# Patient Record
Sex: Female | Born: 1950
Health system: Southern US, Community
[De-identification: ages and names within clinical notes are randomized; demographics above are authoritative.]

## PROBLEM LIST (undated history)

## (undated) DIAGNOSIS — J45909 Unspecified asthma, uncomplicated: Secondary | ICD-10-CM

## (undated) DIAGNOSIS — R232 Flushing: Secondary | ICD-10-CM

## (undated) DIAGNOSIS — R011 Cardiac murmur, unspecified: Secondary | ICD-10-CM

## (undated) DIAGNOSIS — R42 Dizziness and giddiness: Secondary | ICD-10-CM

## (undated) DIAGNOSIS — I700 Atherosclerosis of aorta: Secondary | ICD-10-CM

## (undated) DIAGNOSIS — F32A Depression, unspecified: Secondary | ICD-10-CM

## (undated) DIAGNOSIS — R51 Headache: Secondary | ICD-10-CM

## (undated) DIAGNOSIS — R931 Abnormal findings on diagnostic imaging of heart and coronary circulation: Secondary | ICD-10-CM

## (undated) DIAGNOSIS — E785 Hyperlipidemia, unspecified: Secondary | ICD-10-CM

## (undated) DIAGNOSIS — D510 Vitamin B12 deficiency anemia due to intrinsic factor deficiency: Secondary | ICD-10-CM

## (undated) DIAGNOSIS — I341 Nonrheumatic mitral (valve) prolapse: Secondary | ICD-10-CM

## (undated) DIAGNOSIS — I499 Cardiac arrhythmia, unspecified: Secondary | ICD-10-CM

## (undated) DIAGNOSIS — E559 Vitamin D deficiency, unspecified: Secondary | ICD-10-CM

## (undated) DIAGNOSIS — R7303 Prediabetes: Secondary | ICD-10-CM

## (undated) DIAGNOSIS — K5790 Diverticulosis of intestine, part unspecified, without perforation or abscess without bleeding: Secondary | ICD-10-CM

## (undated) DIAGNOSIS — E039 Hypothyroidism, unspecified: Secondary | ICD-10-CM

## (undated) DIAGNOSIS — J449 Chronic obstructive pulmonary disease, unspecified: Secondary | ICD-10-CM

## (undated) DIAGNOSIS — G4733 Obstructive sleep apnea (adult) (pediatric): Secondary | ICD-10-CM

## (undated) DIAGNOSIS — L719 Rosacea, unspecified: Secondary | ICD-10-CM

## (undated) DIAGNOSIS — Z9989 Dependence on other enabling machines and devices: Secondary | ICD-10-CM

## (undated) DIAGNOSIS — Z9886 Personal history of breast implant removal: Secondary | ICD-10-CM

## (undated) DIAGNOSIS — H269 Unspecified cataract: Secondary | ICD-10-CM

## (undated) DIAGNOSIS — I10 Essential (primary) hypertension: Secondary | ICD-10-CM

## (undated) DIAGNOSIS — J309 Allergic rhinitis, unspecified: Secondary | ICD-10-CM

## (undated) DIAGNOSIS — E8940 Asymptomatic postprocedural ovarian failure: Secondary | ICD-10-CM

## (undated) DIAGNOSIS — M1990 Unspecified osteoarthritis, unspecified site: Secondary | ICD-10-CM

## (undated) DIAGNOSIS — G4730 Sleep apnea, unspecified: Secondary | ICD-10-CM

## (undated) DIAGNOSIS — H04123 Dry eye syndrome of bilateral lacrimal glands: Secondary | ICD-10-CM

## (undated) DIAGNOSIS — F329 Major depressive disorder, single episode, unspecified: Secondary | ICD-10-CM

## (undated) DIAGNOSIS — K219 Gastro-esophageal reflux disease without esophagitis: Secondary | ICD-10-CM

## (undated) DIAGNOSIS — Z8719 Personal history of other diseases of the digestive system: Secondary | ICD-10-CM

## (undated) DIAGNOSIS — T7840XA Allergy, unspecified, initial encounter: Secondary | ICD-10-CM

## (undated) HISTORY — DX: Hypothyroidism, unspecified: E03.9

## (undated) HISTORY — DX: Prediabetes: R73.03

## (undated) HISTORY — DX: Major depressive disorder, single episode, unspecified: F32.9

## (undated) HISTORY — DX: Sleep apnea, unspecified: G47.30

## (undated) HISTORY — DX: Obstructive sleep apnea (adult) (pediatric): G47.33

## (undated) HISTORY — PX: OTHER SURGICAL HISTORY: SHX169

## (undated) HISTORY — DX: Unspecified osteoarthritis, unspecified site: M19.90

## (undated) HISTORY — DX: Unspecified asthma, uncomplicated: J45.909

## (undated) HISTORY — DX: Vitamin B12 deficiency anemia due to intrinsic factor deficiency: D51.0

## (undated) HISTORY — DX: Essential (primary) hypertension: I10

## (undated) HISTORY — PX: HERNIA REPAIR: SHX51

## (undated) HISTORY — DX: Diverticulosis of intestine, part unspecified, without perforation or abscess without bleeding: K57.90

## (undated) HISTORY — DX: Gastro-esophageal reflux disease without esophagitis: K21.9

## (undated) HISTORY — DX: Atherosclerosis of aorta: I70.0

## (undated) HISTORY — DX: Nonrheumatic mitral (valve) prolapse: I34.1

## (undated) HISTORY — DX: Dry eye syndrome of bilateral lacrimal glands: H04.123

## (undated) HISTORY — DX: Chronic obstructive pulmonary disease, unspecified: J44.9

## (undated) HISTORY — DX: Depression, unspecified: F32.A

## (undated) HISTORY — DX: Flushing: R23.2

## (undated) HISTORY — PX: NASAL SINUS SURGERY: SHX719

## (undated) HISTORY — DX: Rosacea, unspecified: L71.9

## (undated) HISTORY — DX: Allergy, unspecified, initial encounter: T78.40XA

## (undated) HISTORY — DX: Dependence on other enabling machines and devices: Z99.89

## (undated) HISTORY — DX: Unspecified cataract: H26.9

## (undated) HISTORY — DX: Personal history of other diseases of the digestive system: Z87.19

## (undated) HISTORY — DX: Abnormal findings on diagnostic imaging of heart and coronary circulation: R93.1

## (undated) HISTORY — DX: Allergic rhinitis, unspecified: J30.9

## (undated) HISTORY — DX: Hyperlipidemia, unspecified: E78.5

## (undated) HISTORY — DX: Dizziness and giddiness: R42

## (undated) HISTORY — PX: TUBAL LIGATION: SHX77

## (undated) HISTORY — DX: Personal history of breast implant removal: Z98.86

## (undated) HISTORY — DX: Vitamin D deficiency, unspecified: E55.9

## (undated) HISTORY — DX: Asymptomatic postprocedural ovarian failure: E89.40

## (undated) HISTORY — DX: Cardiac murmur, unspecified: R01.1

## (undated) HISTORY — DX: Headache: R51

## (undated) HISTORY — DX: Cardiac arrhythmia, unspecified: I49.9

---

## 1989-10-27 DIAGNOSIS — E8940 Asymptomatic postprocedural ovarian failure: Secondary | ICD-10-CM

## 1989-10-27 HISTORY — PX: TOTAL ABDOMINAL HYSTERECTOMY: SHX209

## 1989-10-27 HISTORY — DX: Asymptomatic postprocedural ovarian failure: E89.40

## 1999-09-07 ENCOUNTER — Encounter: Payer: Self-pay | Admitting: Pulmonary Disease

## 2001-04-18 ENCOUNTER — Encounter: Payer: Self-pay | Admitting: Pulmonary Disease

## 2001-04-23 ENCOUNTER — Encounter: Payer: Self-pay | Admitting: Pulmonary Disease

## 2001-12-21 ENCOUNTER — Encounter: Payer: Self-pay | Admitting: *Deleted

## 2001-12-21 ENCOUNTER — Encounter: Admission: RE | Admit: 2001-12-21 | Discharge: 2001-12-21 | Payer: Self-pay | Admitting: *Deleted

## 2004-10-27 HISTORY — PX: COLONOSCOPY: SHX174

## 2004-10-27 HISTORY — PX: BREAST ENHANCEMENT SURGERY: SHX7

## 2005-10-27 HISTORY — PX: CHOLECYSTECTOMY: SHX55

## 2005-10-27 HISTORY — PX: APPENDECTOMY: SHX54

## 2007-03-24 ENCOUNTER — Encounter: Admission: RE | Admit: 2007-03-24 | Discharge: 2007-03-24 | Payer: Self-pay | Admitting: Internal Medicine

## 2007-08-20 ENCOUNTER — Encounter: Payer: Self-pay | Admitting: Pulmonary Disease

## 2007-08-23 ENCOUNTER — Encounter: Payer: Self-pay | Admitting: Pulmonary Disease

## 2007-08-28 HISTORY — PX: OTHER SURGICAL HISTORY: SHX169

## 2007-09-06 ENCOUNTER — Encounter: Payer: Self-pay | Admitting: Pulmonary Disease

## 2007-09-17 ENCOUNTER — Encounter: Payer: Self-pay | Admitting: Pulmonary Disease

## 2007-11-28 HISTORY — PX: US ECHOCARDIOGRAPHY: HXRAD669

## 2007-12-02 ENCOUNTER — Encounter: Payer: Self-pay | Admitting: Pulmonary Disease

## 2008-05-26 ENCOUNTER — Encounter: Admission: RE | Admit: 2008-05-26 | Discharge: 2008-05-26 | Payer: Self-pay | Admitting: Internal Medicine

## 2008-06-09 ENCOUNTER — Ambulatory Visit: Payer: Self-pay | Admitting: Pulmonary Disease

## 2008-06-09 DIAGNOSIS — G2581 Restless legs syndrome: Secondary | ICD-10-CM | POA: Insufficient documentation

## 2008-06-09 DIAGNOSIS — J302 Other seasonal allergic rhinitis: Secondary | ICD-10-CM

## 2008-06-09 DIAGNOSIS — Z9989 Dependence on other enabling machines and devices: Secondary | ICD-10-CM | POA: Insufficient documentation

## 2008-06-09 DIAGNOSIS — J3089 Other allergic rhinitis: Secondary | ICD-10-CM | POA: Insufficient documentation

## 2008-06-09 DIAGNOSIS — E785 Hyperlipidemia, unspecified: Secondary | ICD-10-CM

## 2008-06-09 DIAGNOSIS — R519 Headache, unspecified: Secondary | ICD-10-CM | POA: Insufficient documentation

## 2008-06-09 DIAGNOSIS — J309 Allergic rhinitis, unspecified: Secondary | ICD-10-CM

## 2008-06-09 DIAGNOSIS — G8929 Other chronic pain: Secondary | ICD-10-CM

## 2008-06-09 DIAGNOSIS — G4733 Obstructive sleep apnea (adult) (pediatric): Secondary | ICD-10-CM | POA: Insufficient documentation

## 2008-06-09 DIAGNOSIS — E782 Mixed hyperlipidemia: Secondary | ICD-10-CM | POA: Insufficient documentation

## 2008-06-09 DIAGNOSIS — I499 Cardiac arrhythmia, unspecified: Secondary | ICD-10-CM | POA: Insufficient documentation

## 2008-06-09 DIAGNOSIS — I480 Paroxysmal atrial fibrillation: Secondary | ICD-10-CM

## 2008-06-09 DIAGNOSIS — I10 Essential (primary) hypertension: Secondary | ICD-10-CM | POA: Insufficient documentation

## 2008-06-09 DIAGNOSIS — R51 Headache: Secondary | ICD-10-CM

## 2008-06-09 DIAGNOSIS — Z8679 Personal history of other diseases of the circulatory system: Secondary | ICD-10-CM | POA: Insufficient documentation

## 2008-06-09 HISTORY — DX: Essential (primary) hypertension: I10

## 2008-06-09 HISTORY — DX: Hyperlipidemia, unspecified: E78.5

## 2008-06-09 HISTORY — DX: Cardiac arrhythmia, unspecified: I49.9

## 2008-06-09 HISTORY — DX: Headache, unspecified: R51.9

## 2008-06-09 HISTORY — DX: Allergic rhinitis, unspecified: J30.9

## 2008-06-09 HISTORY — DX: Paroxysmal atrial fibrillation: I48.0

## 2008-06-09 HISTORY — DX: Other chronic pain: G89.29

## 2008-06-29 ENCOUNTER — Ambulatory Visit: Payer: Self-pay | Admitting: Pulmonary Disease

## 2008-06-29 DIAGNOSIS — J984 Other disorders of lung: Secondary | ICD-10-CM | POA: Insufficient documentation

## 2008-07-16 ENCOUNTER — Encounter: Payer: Self-pay | Admitting: Pulmonary Disease

## 2008-08-09 ENCOUNTER — Encounter: Payer: Self-pay | Admitting: Pulmonary Disease

## 2008-08-22 ENCOUNTER — Encounter: Payer: Self-pay | Admitting: Pulmonary Disease

## 2008-11-02 HISTORY — PX: ESOPHAGOGASTRODUODENOSCOPY: SHX1529

## 2008-11-23 LAB — HM DEXA SCAN: HM Dexa Scan: NORMAL

## 2008-12-25 HISTORY — PX: RECTOCELE REPAIR: SHX761

## 2009-03-02 ENCOUNTER — Ambulatory Visit: Payer: Self-pay | Admitting: Adult Health

## 2009-03-02 DIAGNOSIS — J209 Acute bronchitis, unspecified: Secondary | ICD-10-CM | POA: Insufficient documentation

## 2009-03-02 DIAGNOSIS — R05 Cough: Secondary | ICD-10-CM | POA: Insufficient documentation

## 2009-03-02 DIAGNOSIS — R059 Cough, unspecified: Secondary | ICD-10-CM | POA: Insufficient documentation

## 2009-04-12 ENCOUNTER — Ambulatory Visit: Payer: Self-pay | Admitting: Internal Medicine

## 2009-07-05 ENCOUNTER — Ambulatory Visit: Payer: Self-pay | Admitting: Pulmonary Disease

## 2009-09-26 HISTORY — PX: US ECHOCARDIOGRAPHY: HXRAD669

## 2009-09-27 HISTORY — PX: BREAST BIOPSY: SHX20

## 2010-01-11 ENCOUNTER — Encounter: Payer: Self-pay | Admitting: Pulmonary Disease

## 2010-01-15 ENCOUNTER — Ambulatory Visit: Payer: Self-pay | Admitting: Pulmonary Disease

## 2010-01-28 ENCOUNTER — Telehealth: Payer: Self-pay | Admitting: Pulmonary Disease

## 2010-11-11 LAB — HM COLONOSCOPY

## 2010-11-16 ENCOUNTER — Encounter: Payer: Self-pay | Admitting: Internal Medicine

## 2010-11-26 NOTE — Assessment & Plan Note (Signed)
Summary: sob//mbw   Copy to:  [facility]/Referring Deidra Spease:  Prochnau  CC:  increased SOB, dry cough, and wheezing since rectocele repair on 01-15-09 - saw PCP and was given breathing tx and zpak with no relief.  History of Present Illness: 60 -year-old female  seen for pulmonary consult for evaluation of pulmonary nodules.   9/09--She was last noted to have a clustering of nodular densities in the right upper lobe, as well as a left lower lobe nodule and also one in the lingula from a CAT scan in October of 2008. She underwent bronchoscopy by Dr. Blenda Nicely, with no specific diagnosis being obtained. The patient has subsequently had a followup CT in February of this year, and there were no pulmonary nodules or mediastinal lymphadenopathy noted. There was a 17 mm right axillary lymph node which was 13 mm on a CAT scan from 2006. The patient thinks that she may have had exposure to tuberculosis in the past, but is unsure. She also lived in [place] for 2 years, which is an area endemic for histoplasmosis.  --the patient has a history of pulmonary nodules last year which was investigated with bronchoscopy and yielded a negative result. She has had a followup CT in February of this year, with no pulmonary nodules being noted. I suspect these were inflammatory in nature. No further workup is required. She was found to have an enlarging right axillary node, and I would make sure that her health maintenance is up to date. I wonder if this is related to her ruptured breast implant?  Mar 02, 2009--Presents for acute office viist. c/o increased SOB, dry cough, wheezing since rectocele repair on 01-15-09 - saw PCP and was given breathing tx and zpak. felt better for about 2 weeks. Now symptoms returning. Had xray pre-op which she reports as normal. . Symptoms started  ~6 weeks ago.    Medications Prior to Update: 1)  Allegra-D 12 Hour 60-120 Mg  Xr12h-Tab (Fexofenadine-Pseudoephedrine) ....  Take 1 Tab By Mouth Once Daily As Needed 2)  Aspirin Low Dose 81 Mg  Tabs (Aspirin) .... Take 1 Tablet Once Daily 3)  Klor-Con M10 10 Meq  Cr-Tabs (Potassium Chloride Crys Cr) .... Take 1 Tablet By Mouth Once A Day 4)  Metoprolol-Hydrochlorothiazide 100-25 Mg  Tabs (Metoprolol-Hydrochlorothiazide) .... Take 1/2 Tab By Mouth Once Daily 5)  Metoprolol Succinate 50 Mg  Xr24h-Tab (Metoprolol Succinate) .... Take 1 Tablet By Mouth Once A Day 6)  Paxil 30 Mg  Tabs (Paroxetine Hcl) .... Take 1 Tab By Mouth At Bedtime 7)  Levothroid 50 Mcg  Tabs (Levothyroxine Sodium) .... Take 1 Tablet By Mouth Once A Day 8)  Tricor 145 Mg  Tabs (Fenofibrate) .... Take 1 Tab By Mouth At Bedtime 9)  Simvastatin 40 Mg  Tabs (Simvastatin) .... Take 1 Tab By Mouth At Bedtime 10)  Estrace 2 Mg  Tabs (Estradiol) .... Take 1 Tablet By Mouth Once A Day 11)  Singulair 10 Mg  Tabs (Montelukast Sodium) .... Take 1 Tab By Mouth At Bedtime 12)  Ipratropium Bromide 0.03 %  Soln (Ipratropium Bromide) .... Inhale 2 Spray in Each Nostril 2 To 3 Times Per Day For Runny Nose. 13)  Patanase 0.6 %  Soln (Olopatadine Hcl) .... Inhale 2 Puffs in Each Nostril Two Times A Day 14)  Proventil Hfa 108 (90 Base) Mcg/act  Aers (Albuterol Sulfate) .[name].. 1-2 Puffs Every 4-6 Hours As Needed 15)  Ranitidine Hcl 300 Mg  Caps (Ranitidine Hcl) .Marland KitchenMarland KitchenMarland Kitchen  Take 1 Tab By Mouth At Bedtime 16)  Pantoprazole Sodium 40 Mg  Tbec (Pantoprazole Sodium) .... Take 1 Tab By Mouth Each Morning 17)  Lamisil 250 Mg  Tabs (Terbinafine Hcl) .... Take 1 Tablet By Mouth Once A Day 18)  Meclizine Hcl 25 Mg  Tabs (Meclizine Hcl) .... Take 1 Tab By Mouth As Needed For Dizziness (Take With Diazepam) 19)  Diazepam 5 Mg  Tabs (Diazepam) .... Take 1 Tab By Mouth As Needed For Dizziness. (Take With Meclizine) 20)  Mirapex 0.25 Mg  Tabs (Pramipexole Dihydrochloride) .... By Mouth At Bedtime 21)  Vitamin D 16109 Unit Caps (Ergocalciferol) .... Once Weekly  Current Medications  (verified): 1)  Allegra-D 12 Hour 60-120 Mg  Xr12h-Tab (Fexofenadine-Pseudoephedrine) .... Take 1 Tab By Mouth Once Daily As Needed 2)  Aspirin Low Dose 81 Mg  Tabs (Aspirin) .... Take 1 Tablet Once Daily 3)  Metoprolol Tartrate 50 Mg Tabs (Metoprolol Tartrate) .... Take 1 Tablet By Mouth Two Times A Day 4)  Levothroid 50 Mcg  Tabs (Levothyroxine Sodium) .... Take 1 Tablet By Mouth Once A Day 5)  Singulair 10 Mg  Tabs (Montelukast Sodium) .... Take 1 Tab By Mouth At Bedtime 6)  Ipratropium Bromide 0.03 %  Soln (Ipratropium Bromide) .... Inhale 2 Spray in Each Nostril 2 To 3 Times Per Day For Runny Nose. 7)  Patanase 0.6 %  Soln (Olopatadine Hcl) .... Inhale 2 Puffs in Each Nostril Two Times A Day 8)  Proventil Hfa 108 (90 Base) Mcg/act  Aers (Albuterol Sulfate) .[name].. 1-2 Puffs Every 4-6 Hours As Needed 9)  Meclizine Hcl 25 Mg  Tabs (Meclizine Hcl) .... Take 1 Tab By Mouth As Needed For Dizziness (Take With Diazepam) 10)  Diazepam 5 Mg  Tabs (Diazepam) .... Take 1 Tab By Mouth As Needed For Dizziness. (Take With Meclizine) 11)  Vitamin D 60454 Unit Caps (Ergocalciferol) .... Once Weekly 12)  Vivelle 0.1 Mg/24hr Pttw (Estradiol) .... Apply Once Every 3 Days 13)  Kapidex 60 Mg Cpdr (Dexlansoprazole) .... Take 1 Capsule By Mouth Once A Day 14)  Premarin 0.625 Mg/gm Crea (Estrogens, Conjugated) .... Apply Every Night 15)  Cyanocobalamin 1000 Mcg/ml Soln (Cyanocobalamin) .[name].. 1 Shot Every 4-6 Weeks 16)  Lovaza 1 Gm Caps (Omega-3-Acid Ethyl Esters) .... Take 1 Capsule By Mouth Once A Day  Allergies (verified): 1)  ! Codeine 2)  ! Sulfa 3)  ! 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    History of children who are grown (06/29/2008)  Risk Factors:    Alcohol Use: N/A    >5 drinks/d w/in last 3 months: N/A     Caffeine Use: N/A    Diet: N/A    Exercise: N/A  Risk Factors:    Smoking Status: quit (06/29/2008)    Packs/Day: N/A    Cigars/wk: N/A    Pipe Use/wk: N/A    Cans of tobacco/wk: N/A    Passive Smoke Exposure: N/A  Vital Signs:  Patient profile:   60 year old female Height:      65.5 inches Weight:      195 pounds BMI:     32.07 O2 Sat:      96 % Temp:     96.8 degrees F oral Pulse rate:  68 / minute BP sitting:   126 / 76  (left arm) Cuff size:   regular  Vitals Entered By: Boone Master CNA (Mar 02, 2009 3:46 PM)  O2 Sat at Rest %:  96 O2 Flow:  room air CC: increased SOB, dry cough, wheezing since rectocele repair on 01-15-09 - saw PCP and was given breathing tx and zpak with no relief Is Patient Diabetic? No Comments Medications reviewed with patient Boone Master CNA  Mar 02, 2009 3:48 PM     Impression & Recommendations:  Problem # 1:  BRONCHITIS, ACUTE (ICD-466.0)  Slow to resolve w/ upper airway cough syndrome ?GERD vs Rhinitis REC:  Prednisone taper over next week.  Mucinex DM two times a day for cough Tessalon three times a day for cough Tussionex 1/2-1 tsp two times a day as needed cough, may make you sleepy Zyrtec 10mg  at bedtime  Saline nasal rinses as needed  Add Pepcid 10mg  at bedtime  AVOID COUGHING, THROAT CLEARING. Use sugarless candy, NO MINTS, ice chips.  Please contact office for sooner follow up if symptoms do not improve or worsen  follow up Dr. Shelle Iron 3 weeks.     Allegra-d 12 Hour 60-120 Mg Xr12h-tab (Fexofenadine-pseudoephedrine) .[name]... Take 1 tab by mouth once daily as needed    Singulair 10 Mg Tabs (Montelukast sodium) .[name]... Take 1 tab by mouth at bedtime    Proventil Hfa 108 (90 Base) Mcg/act Aers (Albuterol sulfate) .[name]... 1-2 puffs every 4-6 hours as needed    Tessalon 200 Mg Caps (Benzonatate) .[name]... 1 by mouth three times a day as needed cough    Tussionex Pennkinetic Er 8-10 Mg/47ml Lqcr (Chlorpheniramine-hydrocodone) .[name]... 1/2-1  tsp every 12 hr as needed  Orders: Est. Patient Level IV (16109)  Medications Added to Medication List This Visit: 1)  Metoprolol Tartrate 50 Mg Tabs (Metoprolol tartrate) .... Take 1 tablet by mouth two times a day 2)  Vivelle 0.1 Mg/24hr Pttw (Estradiol) .... Apply once every 3 days 3)  Kapidex 60 Mg Cpdr (Dexlansoprazole) .... Take 1 capsule by mouth once a day 4)  Premarin 0.625 Mg/gm Crea (Estrogens, conjugated) .... Apply every night 5)  Cyanocobalamin 1000 Mcg/ml Soln (Cyanocobalamin) .[name].. 1 shot every 4-6 weeks 6)  Lovaza 1 Gm Caps (Omega-3-acid ethyl esters) .... Take 1 capsule by mouth once a day 7)  Prednisone 10 Mg Tabs (Prednisone) .... 4 tabs for 2 days, then 3 tabs for 2 days, 2 tabs for 2 days, then 1 tab for 2 days, then stop 8)  Tessalon 200 Mg Caps (Benzonatate) .[name].. 1 by mouth three times a day as needed cough 9)  Tussionex Pennkinetic Er 8-10 Mg/35ml Lqcr (Chlorpheniramine-hydrocodone) .... 1/2-1 tsp every 12 hr as needed  Complete Medication List: 1)  Allegra-d 12 Hour 60-120 Mg Xr12h-tab (Fexofenadine-pseudoephedrine) .... Take 1 tab by mouth once daily as needed 2)  Aspirin Low Dose 81 Mg Tabs (Aspirin) .... Take 1 tablet once daily 3)  Metoprolol Tartrate 50 Mg Tabs (Metoprolol tartrate) .... Take 1 tablet by mouth two times a day 4)  Levothroid 50 Mcg Tabs (Levothyroxine sodium) .... Take 1 tablet by mouth once a day 5)  Singulair 10 Mg Tabs (Montelukast sodium) .... Take 1 tab by mouth at bedtime 6)  Ipratropium Bromide 0.03 % Soln (Ipratropium bromide) .... Inhale 2 spray in each nostril 2 to 3 times per day for runny nose. 7)  Patanase 0.6 % Soln (Olopatadine hcl) .... Inhale 2 puffs in each nostril  two times a day 8)  Proventil Hfa 108 (90 Base) Mcg/act Aers (Albuterol sulfate) .[name].. 1-2 puffs every 4-6 hours as needed 9)  Meclizine Hcl 25 Mg Tabs (Meclizine hcl) .... Take 1 tab by mouth as needed for dizziness (take with diazepam) 10)  Diazepam 5 Mg Tabs  (Diazepam) .... Take 1 tab by mouth as needed for dizziness. (take with meclizine) 11)  Vitamin D 16109 Unit Caps (Ergocalciferol) .... Once weekly 12)  Vivelle 0.1 Mg/24hr Pttw (Estradiol) .... Apply once every 3 days 13)  Kapidex 60 Mg Cpdr (Dexlansoprazole) .... Take 1 capsule by mouth once a day 14)  Premarin 0.625 Mg/gm Crea (Estrogens, conjugated) .... Apply every night 15)  Cyanocobalamin 1000 Mcg/ml Soln (Cyanocobalamin) .[name].. 1 shot every 4-6 weeks 16)  Lovaza 1 Gm Caps (Omega-3-acid ethyl esters) .... Take 1 capsule by mouth once a day 17)  Prednisone 10 Mg Tabs (Prednisone) .... 4 tabs for 2 days, then 3 tabs for 2 days, 2 tabs for 2 days, then 1 tab for 2 days, then stop 18)  Tessalon 200 Mg Caps (Benzonatate) .[name].. 1 by mouth three times a day as needed cough 19)  Tussionex Pennkinetic Er 8-10 Mg/67ml Lqcr (Chlorpheniramine-hydrocodone) .... 1/2-1 tsp every 12 hr as needed  Patient Instructions: 1)  Prednisone taper over next week.  2)  Mucinex DM two times a day for cough 3)  Tessalon three times a day for cough 4)  Tussionex 1/2-1 tsp two times a day as needed cough, may make you sleepy 5)  Zyrtec 10mg  at bedtime  6)  Saline nasal rinses as needed  7)  Add Pepcid 10mg  at bedtime  8)  AVOID COUGHING, THROAT CLEARING. Use sugarless candy, NO MINTS, ice chips.  9)  Please contact office for sooner follow up if symptoms do not improve or worsen  10)  follow up Dr. Shelle Iron 3 weeks.  Prescriptions: TUSSIONEX PENNKINETIC ER 8-10 MG/5ML LQCR (CHLORPHENIRAMINE-HYDROCODONE) 1/2-1 tsp every 12 hr as needed  #4 oz x 0   Entered and Authorized by:   Rubye Oaks NP   Signed by:   Tammy Parrett NP on 03/02/2009   Method used:   Print then Give to Patient   RxID:   [id number] TESSALON 200 MG CAPS (BENZONATATE) 1 by mouth three times a day as needed cough  #45 x 0   Entered and Authorized by:   Rubye Oaks NP   Signed by:   Rubye Oaks NP on 03/02/2009   Method used:    Electronically to        [facility]. [phone]* (retail)       [address]       [place], [place]  [address]       Ph: [phone] or [phone]       Fax: [phone]   RxID:   [id number] PREDNISONE 10 MG TABS (PREDNISONE) 4 tabs for 2 days, then 3 tabs for 2 days, 2 tabs for 2 days, then 1 tab for 2 days, then stop  #20 x 0   Entered and Authorized by:   Rubye Oaks NP   Signed by:   Rubye Oaks NP on 03/02/2009   Method used:   Electronically to        [facility]. [phone]* (retail)       [address]       [place], [place]  [address]       Ph: [phone] or [phone]       Fax: [phone]   RxID:   [id number]

## 2010-11-26 NOTE — Progress Notes (Signed)
Summary: new sleep mask  Phone Note Call from Patient Call back at Work Phone [phone] Call back at [phone]   Caller: Patient Call For: clance Summary of Call: need rx for a new sleep mask - old mask, time to replace. Send to [facility]. Initial call taken by: Eugene Gavia,  January 28, 2010 3:53 PM  Follow-up for Phone Call        pt was just seen by [facility] on 01-15-2010.  pt requesting rx for a new cpap mask.  Please advise if ok or not.  thank you.  Aundra Millet Reynolds LPN  January 28, 9146 4:11 PM   Additional Follow-up for Phone Call Additional follow up Details #1::        ok with me.  will send order to [facility] Additional Follow-up by: Barbaraann Share MD,  January 28, 2010 5:36 PM

## 2010-11-26 NOTE — Assessment & Plan Note (Signed)
Summary: rov for osa   Primary Provider/Referring Provider:  Prochnau  CC:  Pt is here for a f/u appt.  Pt states she is using her cpap machine every night.  Approx 8 hours per night. Pt denied any problems with mask or pressure.  [name]  History of Present Illness: the pt comes in today for f/u of her osa.  She is wearing cpap compliantly, and feels that she is doing well.  Her alertness during day is much improved, and she has restorative sleep.  Her download shows great compliance, and no significant mask leak.  She has a lot of questions today about her sleep apnea, prognosis, retesting, etc.  I have gone over all of these with her in detail.  Current Medications (verified): 1)  Allegra-D 12 Hour 60-120 Mg  Xr12h-Tab (Fexofenadine-Pseudoephedrine) .... Take 1 Tab By Mouth Once Daily As Needed 2)  Aspirin Low Dose 81 Mg  Tabs (Aspirin) .... Take 1 Tablet Once Daily 3)  Metoprolol Tartrate 50 Mg Tabs (Metoprolol Tartrate) .... Take 1 Tablet By Mouth Two Times A Day 4)  Levothroid 50 Mcg  Tabs (Levothyroxine Sodium) .... Take 1 Tablet By Mouth Once A Day 5)  Singulair 10 Mg  Tabs (Montelukast Sodium) .... Take 1 Tab By Mouth At Bedtime 6)  Ipratropium Bromide 0.03 %  Soln (Ipratropium Bromide) .... Inhale 2 Spray in Each Nostril 2 To 3 Times Per Day For Runny Nose As Needed 7)  Patanase 0.6 %  Soln (Olopatadine Hcl) .... Inhale 2 Puffs in Each Nostril Two Times A Day 8)  Proventil Hfa 108 (90 Base) Mcg/act  Aers (Albuterol Sulfate) .[name].. 1-2 Puffs Every 4-6 Hours As Needed 9)  Meclizine Hcl 25 Mg  Tabs (Meclizine Hcl) .... Take 1 Tab By Mouth As Needed For Dizziness (Take With Diazepam) 10)  Diazepam 5 Mg  Tabs (Diazepam) .... Take 1 Tab By Mouth As Needed For Dizziness. (Take With Meclizine) 11)  Vivelle 0.1 Mg/24hr Pttw (Estradiol) .... Apply Once Every 3 Days 12)  Dexilant 60 Mg Cpdr (Dexlansoprazole) .... Take 1 Tablet By Mouth Once A Day 13)  Cyanocobalamin 1000 Mcg/ml Soln (Cyanocobalamin)  .[name].. 1 Shot Every 4-6 Weeks 14)  Lovaza 1 Gm Caps (Omega-3-Acid Ethyl Esters) .... Take 1 Capsule By Mouth Once A Day 15)  Cpap 13 [facility] 16)  Replacement Cpap Mask of Choice and Supplies 17)  Benzonatate 200 Mg Caps (Benzonatate) .... Take By Mouth As Needed 18)  Zyrtec Allergy 10 Mg Caps (Cetirizine Hcl) .... Take 1 Tablet By Mouth Once A Day 19)  Metrogel 1 % Gel (Metronidazole) .... Use As Directed 20)  Renova 0.02 % Crea (Tretinoin (Facial Wrinkles)) .... Use As Directed 21)  Premarin Cream .... Use Weekly 22)  Xanax 0.25 Mg Tabs (Alprazolam) .... Take By Mouth As Needed 23)  Lexapro 5 Mg Tabs (Escitalopram Oxalate) .... Take 1 Tablet By Mouth Once A Day 24)  Famotidine 10 Mg Tabs (Famotidine) .... Take 1 Tablet By Mouth Once A Day 25)  Simvastatin 40 Mg Tabs (Simvastatin) .... Take 1 Tablet By Mouth Once A Day  Allergies (verified): 1)  ! Codeine 2)  ! Sulfa 3)  ! Augmentin  Vital Signs:  Patient profile:   60 year old female Height:      65.5 inches Weight:      195.25 pounds BMI:     32.11 O2 Sat:      97 % on Room air Temp:  97.9 degrees F oral Pulse rate:   69 / minute BP sitting:   116 / 80  (left arm) Cuff size:   regular  Vitals Entered By: Arman Filter LPN (July 05, 2009 8:54 AM)  O2 Flow:  Room air CC: Pt is here for a f/u appt.  Pt states she is using her cpap machine every night.  Approx 8 hours per night. Pt denied any problems with mask or pressure.   Comments Medications reviewed with patient Arman Filter LPN  July 05, 2009 8:54 AM    Physical Exam  General:  ow female in nad Nose:  no skin breakdown or pressure necrosis from cpap mask Extremities:  no edema Neurologic:  alert, not sleepy moves all 4.   Impression & Recommendations:  Problem # 1:  OBSTRUCTIVE SLEEP APNEA (ICD-327.23) The pt is doing well with cpap.  She has excellent tolerance, and her pressure has been optimized to 13cm.  I have asked her to work  aggressively on weigh loss.  I have answered all of the pt's questions regarding mask alternatives, prognosis, when to retest,  various dme"s available, and issues about the machine.  I have spent with the patient during this visit.  Medications Added to Medication List This Visit: 1)  Dexilant 60 Mg Cpdr (Dexlansoprazole) .... Take 1 tablet by mouth once a day 2)  Benzonatate 200 Mg Caps (Benzonatate) .... Take by mouth as needed 3)  Zyrtec Allergy 10 Mg Caps (Cetirizine hcl) .... Take 1 tablet by mouth once a day 4)  Metrogel 1 % Gel (Metronidazole) .... Use as directed 5)  Renova 0.02 % Crea (Tretinoin (facial wrinkles)) .... Use as directed 6)  Premarin Cream  .... Use weekly 7)  Xanax 0.25 Mg Tabs (Alprazolam) .... Take by mouth as needed 8)  Lexapro 5 Mg Tabs (Escitalopram oxalate) .... Take 1 tablet by mouth once a day 9)  Famotidine 10 Mg Tabs (Famotidine) .... Take 1 tablet by mouth once a day 10)  Simvastatin 40 Mg Tabs (Simvastatin) .... Take 1 tablet by mouth once a day  Other Orders: Est. Patient Level III ([address]109) DME Referral (DME)  Patient Instructions: 1)  continue with cpap 2)  work on weight loss 3)  let us know if you develop breathing issues. 4)  follow up with me in one year.

## 2010-11-26 NOTE — Procedures (Signed)
Summary: [facility]   Imported By: Lester [place] 08/25/2008 13:02:48  _____________________________________________________________________  External Attachment:    Type:   Image     Comment:   External Document

## 2010-11-26 NOTE — Miscellaneous (Signed)
Summary: [facility] CPAP [facility]  [facility]   Imported By: Lester [place] 07/21/2008 08:57:38  _____________________________________________________________________  External Attachment:    Type:   Image     Comment:   External Document

## 2010-11-26 NOTE — Assessment & Plan Note (Signed)
Summary: acute sick visit for acute bronchitis   Copy to:  Philemon Kingdom Primary Provider/Referring Provider:  Sudie Bailey  CC:  Pt states symptoms started 1 week ago.  pt states she saw her pcp on Friday who started her on abx and pred taper.  pt states symptoms have since gotten worse.  pt c/o increased sob with exertion, coughing up yellow to green colored sputu, head and chest congestion, thick yellow nasal drainage, swelling in neck. low grade fever, sore throat, and and headache.  .  History of Present Illness: The pt is a 59y/o female who I usually follow for osa, and have also seen for stable pulmonary nodules in the past.  I have never seen her for breathing issues.  She does have very mild obstructive lung disease manifested by airtrapping on pfts obtained from records from her primary md in the past.  She comes in today for a one week h/o sore throat, sneezing, runny nose, cough, sinus congestion.  Was seen by her primary md and given 10d avelox, prednisone dose pak, and mucinex.  She tells me her cxr was clear.  Currently, she feels she is more sob, and is having cough paroxysms with regurgitation and reflux. She now notes yellow-green mucus, and tells me she has had a fever to 99.  She still has 3 days of avelox left.    Current Medications (verified): 1)  Allegra-D 12 Hour 60-120 Mg  Xr12h-Tab (Fexofenadine-Pseudoephedrine) .... Take 1 Tab By Mouth Once Daily As Needed 2)  Aspirin Low Dose 81 Mg  Tabs (Aspirin) .... Take 1 Tablet Once Daily 3)  Metoprolol Tartrate 50 Mg Tabs (Metoprolol Tartrate) .... Take 1 Tablet By Mouth Two Times A Day 4)  Levothroid 50 Mcg  Tabs (Levothyroxine Sodium) .... Take 1 Tablet By Mouth Once A Day 5)  Singulair 10 Mg  Tabs (Montelukast Sodium) .... Take 1 Tab By Mouth At Bedtime 6)  Ipratropium Bromide 0.03 %  Soln (Ipratropium Bromide) .... Inhale 2 Spray in Each Nostril 2 To 3 Times Per Day For Runny Nose As Needed 7)  Patanase 0.6 %  Soln  (Olopatadine Hcl) .... Inhale 2 Puffs in Each Nostril Two Times A Day 8)  Proventil Hfa 108 (90 Base) Mcg/act  Aers (Albuterol Sulfate) .[name].. 1-2 Puffs Every 4-6 Hours As Needed 9)  Meclizine Hcl 25 Mg  Tabs (Meclizine Hcl) .... Take 1 Tab By Mouth As Needed For Dizziness (Take With Diazepam) 10)  Diazepam 5 Mg  Tabs (Diazepam) .... Take 1 Tab By Mouth As Needed For Dizziness. (Take With Meclizine) 11)  Estradiol 0.1 Mg/24hr Ptwk (Estradiol) .... Use As Directed 12)  Cyanocobalamin 1000 Mcg/ml Soln (Cyanocobalamin) .[name].. 1 Shot Every 4-6 Weeks 13)  Lovaza 1 Gm Caps (Omega-3-Acid Ethyl Esters) .... Take 1 To 2 Tabs By Mouth Daily 14)  Cpap 13 [employer] 15)  Replacement Cpap Mask of Choice and Supplies 16)  Benzonatate 200 Mg Caps (Benzonatate) .... Take By Mouth As Needed 17)  Zyrtec Allergy 10 Mg Caps (Cetirizine Hcl) .... Take 1 Tablet By Mouth Once A Day 18)  Metrogel 1 % Gel (Metronidazole) .... Use As Directed 19)  Renova 0.02 % Crea (Tretinoin (Facial Wrinkles)) .... Use As Directed 20)  Premarin Cream .... Use Weekly 21)  Xanax 0.25 Mg Tabs (Alprazolam) .... Take By Mouth As Needed 22)  Famotidine 10 Mg Tabs (Famotidine) .... Take 1 Tablet By Mouth Once A Day 23)  Simvastatin 40 Mg Tabs (Simvastatin) .... 1/2  Tab By Mouth Daily  Allergies (verified): 1)  ! Codeine 2)  ! Sulfa 3)  ! Augmentin  Review of Systems       The patient complains of shortness of breath with activity, productive cough, sore throat, headaches, nasal congestion/difficulty breathing through nose, change in color of mucus, and fever.  The patient denies shortness of breath at rest, non-productive cough, coughing up blood, chest pain, irregular heartbeats, acid heartburn, indigestion, loss of appetite, weight change, abdominal pain, difficulty swallowing, tooth/dental problems, sneezing, itching, ear ache, anxiety, depression, hand/feet swelling, joint stiffness or pain, and rash.     Vital  Signs:  Patient profile:   60 year old female Height:      65.5 inches Weight:      193.13 pounds O2 Sat:      97 % on Room air Temp:     98.0 degrees F oral Pulse rate:   83 / minute BP sitting:   128 / 76  (left arm) Cuff size:   regular  Vitals Entered By: Arman Filter LPN (January 15, 2010 11:45 AM)  O2 Flow:  Room air  Physical Exam  General:  ow female in nad Nose:  no purulence noted. Lungs:  totally clear to auscultation no wheezing or rhonchi Heart:  rrr, no mrg Extremities:  no edema or cyanosis Neurologic:  alert and oriented, moves all 4.   Impression & Recommendations:  Problem # 1:  BRONCHITIS, ACUTE (ICD-466.0) the pt is slowly getting over an episode of acute bronchitis, and has received excellent therapy for this.  There is nothing to suggest she is toxic, her sats are excellent, and she admits that she is primarily sob when she is coughing.  I think she will feel better if she can get symptom relief.  I have recommended sinus rinses for her head congestion, to finish up her abx and prednisone, and will work on cough suppression.   She is to let us know is she doesn't improve.  Medications Added to Medication List This Visit: 1)  Estradiol 0.1 Mg/24hr Ptwk (Estradiol) .... Use as directed 2)  Lovaza 1 Gm Caps (Omega-3-acid ethyl esters) .... Take 1 to 2 tabs by mouth daily 3)  Simvastatin 40 Mg Tabs (Simvastatin) .... 1/2 tab by mouth daily 4)  Tussicaps 10-8 Mg Xr12h-cap (Hydrocod polst-chlorphen polst) .... One in am and pm if needed for cough  Other Orders: Est. Patient Level IV (14782)  Patient Instructions: 1)  finish prednisone and avelox as directed 2)  neilmed sinus rinses am and pm until better. 3)  tussicaps 10/8 one in am and pm if needed 4)  minimize voice use as much as possible. 5)  Keep hard candy (no menthol or peppermint)in your mouth at all times during day to bathe the back of your throat.  No throat clearing 6)  call if not  improving.  Prescriptions: TUSSICAPS 10-8 MG XR12H-CAP (HYDROCOD POLST-CHLORPHEN POLST) one in am and pm if needed for cough  #8 x 0   Entered and Authorized by:   Barbaraann Share MD   Signed by:   Barbaraann Share MD on 01/15/2010   Method used:   Printed then faxed to ...       [facility] [phone]* (retail)       [address]       [place], [place]  [address]       Ph: [phone] or [phone]  Fax: [phone]   RxID:   [id number]    Immunization History:  Influenza Immunization History:    Influenza:  historical (07/27/2009)  Pneumovax Immunization History:    Pneumovax:  historical (07/27/2009)

## 2010-11-26 NOTE — Assessment & Plan Note (Signed)
Summary: consult for pulmonary nodules.   Visit Type:  Initial Consult Referred by:  Philemon Kingdom PCP:  [place]  Chief Complaint:  lung nodule.  History of Present Illness: the patient is a 60 year old female who I have been asked to see for evaluation of pulmonary nodules. She was last noted to have a clustering of nodular densities in the right upper lobe, as well as a left lower lobe nodule and also one in the lingula from a CAT scan in October of 2008. She underwent bronchoscopy by Dr. Blenda Nicely, with no specific diagnosis being obtained. The patient has subsequently had a followup CT in February of this year, and there were no pulmonary nodules or mediastinal lymphadenopathy noted. There was a 17 mm right axillary lymph node which was 13 mm on a CAT scan from 2006. The patient thinks that she may have had exposure to tuberculosis in the past, but is unsure. She also lived in [place] for 2 years, which is an area endemic for histoplasmosis. The patient denies any significant cough or mucus production. She was diagnosed with asthma in 2001, but pulmonary function studies are not available from that time when her diagnosis was made.     Current Allergies: ! CODEINE ! SULFA ! AUGMENTIN  Past Medical History:    hypertension    Heart rhythm is she is of unknown origin or specifics    Questionable history of asthma    Dyslipidemia    Obstructive sleep apnea    Allergic rhinitis  Past Surgical History:    status post cholecystectomy, appendectomy, tubal ligation, and hysterectomy.    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   Nose:     septal deviation to the right but patent Mouth:     elongation of the soft palate with a normal uvula Neck:     no JVD, thyromegaly, or lymphadenopathy. Lungs:     clear to auscultation Heart:     regular rate and rhythm, no MRG Abdomen:     soft and nontender, bowel sounds present Extremities:     no significant edema, pulses intact distally      Impression & Recommendations:  Problem # 1:  PULMONARY NODULE (ICD-518.89) the patient has a history of pulmonary nodules last year which was investigated with bronchoscopy and  yielded a negative result. She has had a followup CT in February of this year, with no pulmonary nodules being noted. I suspect these were inflammatory in nature. No further workup is required. She was found to have an enlarging right axillary node, and I would make sure that her health maintenance is up to date. I wonder if this is related to her ruptured breast implant?  Medications Added to Medication List This Visit: 1)  Aspirin Low Dose 81 Mg Tabs (Aspirin) .... Take 1 tablet once daily 2)  Vitamin D 95621 Unit Caps (Ergocalciferol) .... Once weekly   Patient Instructions: 1)  no further workup required for previously noted pulmonary nodules 2)  will call you with results of autotitration study for pressure optimization.   ]

## 2010-11-26 NOTE — Assessment & Plan Note (Signed)
Summary: consult for osa   Referred by:  Philemon Kingdom PCP:  Philemon Kingdom   History of Present Illness: The pt is a 60 y/o female who I have been asked to see for OSA.  She apparently was diagnosed last year with what she describes as severe sleep apnea.  She has been started on cpap with a full face mask at a pressure of 13cm, and has been compliant with the device.  She clearly has seen a benefit with respect to sleep efficiency and daytime alertness.  However, though improved, she still is not satisfied with her sleep and daytime functioning.  She still feels tired in am's, and has some sleep pressure during the day with periods of inactivity.  She also is having difficulty with her mask fit, and the husband has heard breakthru snoring on occasions.  The pt believes her weight is up 10 pounds since her sleep study.  She typically goes to  bed at 10-11 pm, and arises at 6:30 am to start her day. The pt also describes cramping and an uncomfortable feeling in her legs almost everynight if she holds them still.  It is totally relieved with walking around or moving her legs while sitting.  The husband denies kicking, but they sleep in a king sized bed.  He does note the bed covers are really dishoveled in the am on her side of the bed though.     Current Allergies: ! CODEINE ! SULFA ! AUGMENTIN  Past Medical History:    Current Problems:     HEADACHE, CHRONIC (ICD-784.0)    ABNORMAL HEART RHYTHMS (ICD-427.9)    ANGINA, HX OF (ICD-V12.50)    HYPERTENSION (ICD-401.9)    HYPERLIPIDEMIA (ICD-272.4)    ALLERGIC RHINITIS (ICD-477.9)       Past Surgical History:    status post cholecystectomy, hysterectomy, appendectomy, and tubal ligation.    History of rhinoplasty and nasal septal reconstruction   Family History:    emphysema: sister    allergies: mother, father, siblings    heart disease: mother, father, siblings    rheumatism: sister    cancer: mother and father (lung)  strokes: sister  Social History:    Patient states former smoker.     pt is married.    pt has children.    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    Risk Factors:  Tobacco use:  quit    Year quit:  1979    Pack-years:  less than 1 ppd    Comments:  smoked x 10 years    Review of Systems      See HPI   Vital Signs:  Patient Profile:   60 Years Old Female Weight:      192 pounds O2 Sat:      93 % O2 treatment:    Room Air Temp:     98.0 degrees F oral Pulse rate:   61 / minute BP sitting:   120 / 76  (right arm) Cuff size:   regular  Vitals Entered By: Cyndia Diver LPN (June 09, 2008 9:15 AM)             Is Patient Diabetic? No Comments Medications reviewed with patient Cyndia Diver LPN  June 09, 2008 9:15 AM      Physical Exam  General:     overweight female in no acute distress Eyes:     PERRLA and EOMI.   Nose:     mild  septal deviation to the right Mouth:     elongation of the soft palate with a normal uvula Neck:     no JVD, or megaly, or lymphadenopathy. Lungs:     clear to auscultation Heart:     regular rate and rhythm, no MRG Abdomen:     soft and nontender, bowel sounds present Extremities:     no significant edema, pulses intact distally Neurologic:     alert and oriented, moves all 4 extremities.      Impression & Recommendations:  Problem # 1:  OBSTRUCTIVE SLEEP APNEA (ICD-327.23) the patient has a history of obstructive sleep apnea, and is having poor sleep quality despite being compliant with CPAP. It is unclear whether her sleeping issues are related to her sleep apnea or whether she may also have a secondary sleep disorder such as the restless leg syndrome. Her history is very suggestive of this. At this point, I would like to re- optimize her pressure with an auto titrating device, and also give her a trial of a dopamine agonist.  Problem # 2:  RESTLESS LEGS SYNDROME (ICD-333.94) we'll start  on a trial of Mirapex to see if she has improvement as stated above  Medications Added to Medication List This Visit: 1)  Allegra-d 12 Hour 60-120 Mg Xr12h-tab (Fexofenadine-pseudoephedrine) .... Take 1 tab by mouth once daily as needed 2)  Aspirin Low Dose 81 Mg Tabs (Aspirin) .... Take 1 tablet by mouth two times a day 3)  Klor-con M10 10 Meq Cr-tabs (Potassium chloride crys cr) .... Take 1 tablet by mouth once a day 4)  Metoprolol-hydrochlorothiazide 100-25 Mg Tabs (Metoprolol-hydrochlorothiazide) .... Take 1/2 tab by mouth once daily 5)  Metoprolol Succinate 50 Mg Xr24h-tab (Metoprolol succinate) .... Take 1 tablet by mouth once a day 6)  Paxil 30 Mg Tabs (Paroxetine hcl) .... Take 1 tab by mouth at bedtime 7)  Levothroid 50 Mcg Tabs (Levothyroxine sodium) .... Take 1 tablet by mouth once a day 8)  Tricor 145 Mg Tabs (Fenofibrate) .... Take 1 tab by mouth at bedtime 9)  Simvastatin 40 Mg Tabs (Simvastatin) .... Take 1 tab by mouth at bedtime 10)  Estrace 2 Mg Tabs (Estradiol) .... Take 1 tablet by mouth once a day 11)  Singulair 10 Mg Tabs (Montelukast sodium) .... Take 1 tab by mouth at bedtime 12)  Asmanex 60 Metered Doses 220 Mcg/inh Aepb (Mometasone furoate) .... Inhale 2 puffs two times a day 13)  Ipratropium Bromide 0.03 % Soln (Ipratropium bromide) .... Inhale 2 spray in each nostril 2 to 3 times per day for runny nose. 14)  Patanase 0.6 % Soln (Olopatadine hcl) .... Inhale 2 puffs in each nostril two times a day 15)  Proventil Hfa 108 (90 Base) Mcg/act Aers (Albuterol sulfate) .[name].. 1-2 puffs every 4-6 hours as needed 16)  Ranitidine Hcl 300 Mg Caps (Ranitidine hcl) .... Take 1 tab by mouth at bedtime 17)  Pantoprazole Sodium 40 Mg Tbec (Pantoprazole sodium) .... Take 1 tab by mouth each morning 18)  Lamisil 250 Mg Tabs (Terbinafine hcl) .... Take 1 tablet by mouth once a day 19)  Meclizine Hcl 25 Mg Tabs (Meclizine hcl) .... Take 1 tab by mouth as needed for dizziness (take with  diazepam) 20)  Diazepam 5 Mg Tabs (Diazepam) .... Take 1 tab by mouth as needed for dizziness. (take with meclizine) 21)  Mirapex 0.25 Mg Tabs (Pramipexole dihydrochloride) .... By mouth at bedtime   Patient Instructions: 1)  will get your  mask fitted over at sleep center. 2)  autotitrated device for 2 wks to re-optimize your pressure. 3)  work on weight loss 4)  trial of mirapex dose pack for ?RLS 5)  please bring copy of pulmonary records and disk with xrays to next visit.   Prescriptions: MIRAPEX 0.25 MG  TABS (PRAMIPEXOLE DIHYDROCHLORIDE) By mouth at bedtime  #30 x 6   Entered and Authorized by:   Barbaraann Share MD   Signed by:   Barbaraann Share MD on 06/09/2008   Method used:   Print then Give to Patient   RxID:   [id number]  ]

## 2010-11-26 NOTE — Assessment & Plan Note (Signed)
Summary: f/u 1 month////kp   Copy to:  Philemon Kingdom Primary Provider/Referring Provider:  Prochnau  CC:  3 month follow up - no complaints.  History of Present Illness: History of Present Illness: 60 -year-old female  seen for pulmonary consult for evaluation of pulmonary nodules.   9/09--She was last noted to have a clustering of nodular densities in the right upper lobe, as well as a left lower lobe nodule and also one in the lingula from a CAT scan in October of 2008. She underwent bronchoscopy by Dr. Blenda Nicely, with no specific diagnosis being obtained. The patient has subsequently had a followup CT in February of this year, and there were no pulmonary nodules or mediastinal lymphadenopathy noted. There was a 17 mm right axillary lymph node which was 13 mm on a CAT scan from 2006. The patient thinks that she may have had exposure to tuberculosis in the past, but is unsure. She also lived in [place] for 2 years, which is an area endemic for histoplasmosis.  --the patient has a history of pulmonary nodules last year which was investigated with bronchoscopy and yielded a negative result. She has had a followup CT in February of this year, with no pulmonary nodules being noted. I suspect these were inflammatory in nature. No further workup is required. She was found to have an enlarging right axillary node, and I would make sure that her health maintenance is up to date. I wonder if this is related to her ruptured breast implant?  Mar 02, 2009--Presents for acute office viist. c/o increased SOB, dry cough, wheezing since rectocele repair on 01-15-09 - saw PCP and was given breathing tx and zpak. felt better for about 2 weeks. Now symptoms returning. Had xray pre-op which she reports as normal. . Symptoms started  ~6 weeks ago.   04/12/09- Pulmonary nodules - seen for Dr Shelle Iron (OOT), asthma, right axillary node, OSA Seen by TP in May for cough. She dislikes weight gain associated with systemic  prednisone.  She asks about etiology for cough and says PFT at [facility] was not done well- inexperienced tech. She has been wearing cpap regularly  but  mask uncomfortable but due for replacement.   Medications Prior to Update: 1)  Allegra-D 12 Hour 60-120 Mg  Xr12h-Tab (Fexofenadine-Pseudoephedrine) .... Take 1 Tab By Mouth Once Daily As Needed 2)  Aspirin Low Dose 81 Mg  Tabs (Aspirin) .... Take 1 Tablet Once Daily 3)  Metoprolol Tartrate 50 Mg Tabs (Metoprolol Tartrate) .... Take 1 Tablet By Mouth Two Times A Day 4)  Levothroid 50 Mcg  Tabs (Levothyroxine Sodium) .... Take 1 Tablet By Mouth Once A Day 5)  Singulair 10 Mg  Tabs (Montelukast Sodium) .... Take 1 Tab By Mouth At Bedtime 6)  Ipratropium Bromide 0.03 %  Soln (Ipratropium Bromide) .... Inhale 2 Spray in Each Nostril 2 To 3 Times Per Day For Runny Nose. 7)  Patanase 0.6 %  Soln (Olopatadine Hcl) .... Inhale 2 Puffs in Each Nostril Two Times A Day 8)  Proventil Hfa 108 (90 Base) Mcg/act  Aers (Albuterol Sulfate) .[name].. 1-2 Puffs Every 4-6 Hours As Needed 9)  Meclizine Hcl 25 Mg  Tabs (Meclizine Hcl) .... Take 1 Tab By Mouth As Needed For Dizziness (Take With Diazepam) 10)  Diazepam 5 Mg  Tabs (Diazepam) .... Take 1 Tab By Mouth As Needed For Dizziness. (Take With Meclizine) 11)  Vitamin D 16109 Unit Caps (Ergocalciferol) .... Once Weekly 12)  Vivelle 0.1 Mg/24hr Pttw (  Estradiol) .... Apply Once Every 3 Days 13)  Kapidex 60 Mg Cpdr (Dexlansoprazole) .... Take 1 Capsule By Mouth Once A Day 14)  Premarin 0.625 Mg/gm Crea (Estrogens, Conjugated) .... Apply Every Night 15)  Cyanocobalamin 1000 Mcg/ml Soln (Cyanocobalamin) .[name].. 1 Shot Every 4-6 Weeks 16)  Lovaza 1 Gm Caps (Omega-3-Acid Ethyl Esters) .... Take 1 Capsule By Mouth Once A Day 17)  Prednisone 10 Mg Tabs (Prednisone) .... 4 Tabs For 2 Days, Then 3 Tabs For 2 Days, 2 Tabs For 2 Days, Then 1 Tab For 2 Days, Then Stop 18)  Tessalon 200 Mg Caps (Benzonatate) .[name].. 1 By Mouth Three  Times A Day As Needed Cough 19)  Tussionex Pennkinetic Er 8-10 Mg/49ml Lqcr (Chlorpheniramine-Hydrocodone) .... 1/2-1 Tsp Every 12 Hr As Needed  Current Medications (verified): 1)  Allegra-D 12 Hour 60-120 Mg  Xr12h-Tab (Fexofenadine-Pseudoephedrine) .... Take 1 Tab By Mouth Once Daily As Needed 2)  Aspirin Low Dose 81 Mg  Tabs (Aspirin) .... Take 1 Tablet Once Daily 3)  Metoprolol Tartrate 50 Mg Tabs (Metoprolol Tartrate) .... Take 1 Tablet By Mouth Two Times A Day 4)  Levothroid 50 Mcg  Tabs (Levothyroxine Sodium) .... Take 1 Tablet By Mouth Once A Day 5)  Singulair 10 Mg  Tabs (Montelukast Sodium) .... Take 1 Tab By Mouth At Bedtime 6)  Ipratropium Bromide 0.03 %  Soln (Ipratropium Bromide) .... Inhale 2 Spray in Each Nostril 2 To 3 Times Per Day For Runny Nose As Needed 7)  Patanase 0.6 %  Soln (Olopatadine Hcl) .... Inhale 2 Puffs in Each Nostril Two Times A Day 8)  Proventil Hfa 108 (90 Base) Mcg/act  Aers (Albuterol Sulfate) .[name].. 1-2 Puffs Every 4-6 Hours As Needed 9)  Meclizine Hcl 25 Mg  Tabs (Meclizine Hcl) .... Take 1 Tab By Mouth As Needed For Dizziness (Take With Diazepam) 10)  Diazepam 5 Mg  Tabs (Diazepam) .... Take 1 Tab By Mouth As Needed For Dizziness. (Take With Meclizine) 11)  Vivelle 0.1 Mg/24hr Pttw (Estradiol) .... Apply Once Every 3 Days 12)  Kapidex 60 Mg Cpdr (Dexlansoprazole) .... Take 1 Capsule By Mouth Once A Day 13)  Premarin 0.625 Mg/gm Crea (Estrogens, Conjugated) .... Twice A Week 14)  Cyanocobalamin 1000 Mcg/ml Soln (Cyanocobalamin) .[name].. 1 Shot Every 4-6 Weeks 15)  Lovaza 1 Gm Caps (Omega-3-Acid Ethyl Esters) .... Take 1 Capsule By Mouth Once A Day 16)  Sanctura Xr 60 Mg Xr24h-Cap (Trospium Chloride) .... Take 1 Capsule By Mouth Once A Day  Allergies (verified): 1)  ! Codeine 2)  ! Sulfa 3)  ! Augmentin  Past History:  Past Medical History: Last updated: 06/29/2008 hypertension Heart rhythm is she is of unknown origin or specifics Questionable history  of asthma Dyslipidemia Obstructive sleep apnea Allergic rhinitis  Past Surgical History: Last updated: 06/29/2008 status post cholecystectomy, appendectomy, tubal ligation, and hysterectomy. 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    Vital Signs:  Patient profile:   60 year old female Height:  65.5 inches Weight:      199.25 pounds BMI:     32.77 O2 Sat:      98 % on Room air Pulse rate:   77 / minute BP sitting:   130 / 84  (left arm) Cuff size:   regular  Vitals Entered By: Marijo File CMA (April 12, 2009 9:21 AM)  O2 Flow:  Room air CC: 3 month follow up - no complaints Is Patient Diabetic? No Comments Medications reviewed with patient Boone Master CNA  April 12, 2009 9:22 AM   Home contact number verified with patinet. Boone Master CNA  April 12, 2009 9:22 AM    Physical Exam  Additional Exam:  General: A/Ox3; pleasant and cooperative, NAD, plump, cheerfull SKIN: no rash, lesions NODES: no lymphadenopathy HEENT: [place]/AT, EOM- WNL, Conjuctivae- clear, PERRLA, TM-WNL, Nose- clear, Throat- clear and wnl, Melampatti III NECK: Supple w/ fair ROM, JVD- none, normal carotid impulses w/o bruits Thyroid- normal to palpation CHEST: Clear to P&A HEART: RRR, no m/g/r heard ABDOMEN: Soft and nl; ZOX:WRUE, nl pulses, no edema  NEURO: Grossly intact to observation      Impression & Recommendations:  Problem #  1:  BRONCHITIS, ACUTE (ICD-466.0) She is unclear why she has some persistent cough and indicates prior pft was of doubtfull validity, so I agreed to retest. Substantially improved since acute visit. We discussed and refilled benzonatate for occasional use. The following medications were removed from the medication list:    Tessalon 200 Mg Caps (Benzonatate) .[name]... 1 by mouth three times a day as needed cough    Tussionex Pennkinetic Er 8-10 Mg/38ml Lqcr (Chlorpheniramine-hydrocodone) .[name]... 1/2-1 tsp every 12 hr as needed Her updated medication list for this problem includes:    Allegra-d 12 Hour 60-120 Mg Xr12h-tab (Fexofenadine-pseudoephedrine) .[name]... Take 1 tab by mouth once daily as needed    Singulair 10 Mg Tabs (Montelukast sodium) .[name]... Take 1 tab by mouth at bedtime    Proventil Hfa 108 (90 Base) Mcg/act Aers (Albuterol sulfate) .[name]... 1-2 puffs every 4-6 hours as needed    Benzonatate 100 Mg Caps (Benzonatate) .[name]... 1 qid as needed for cough  Problem # 2:  OBSTRUCTIVE SLEEP APNEA (ICD-327.23)  She can't sleep without cpap and uses it evry night but is aware of substantial leak. She is due to turn in download card, and thinks she may need new mask. We discussed weight loss and she is going to check with a nutrition program at work- may need a referral.  Medications Added to Medication List This Visit: 1)  Ipratropium Bromide 0.03 % Soln (Ipratropium bromide) .... Inhale 2 spray in each nostril 2 to 3 times per day for runny nose as needed 2)  Premarin 0.625 Mg/gm Crea (Estrogens, conjugated) .... Twice a week 3)  Sanctura Xr 60 Mg Xr24h-cap (Trospium chloride) .... Take 1 capsule by mouth once a day 4)  Cpap 13 [employer]  5)  Replacement Cpap Mask of Choice and Supplies  6)  Benzonatate 100 Mg Caps (Benzonatate) .[name].. 1 qid as needed for cough  Other Orders: Est. Patient Level III (45409) Pulmonary Referral (Pulmonary)  Patient Instructions: 1)  Please schedule a follow-up  appointment in 2 months with 2)   Dr Shelle Iron 3)  Schedule PFT 4)  Script for replacement mask and supplies 5)  script was sent to your drug store to refill benzonatate for cough. Prescriptions: REPLACEMENT CPAP MASK OF CHOICE AND SUPPLIES   #1 x prn   Entered  and Authorized by:   Waymon Budge MD   Signed by:   Waymon Budge MD on 04/12/2009   Method used:   Print then Give to Patient   RxID:   [id number] BENZONATATE 100 MG CAPS (BENZONATATE) 1 qid as needed for cough  #50 x prn   Entered and Authorized by:   Waymon Budge MD   Signed by:   Waymon Budge MD on 04/12/2009   Method used:   Electronically to        [facility]. [phone]* (retail)       [address]       [place], [place]  [address]       Ph: [phone] or [phone]       Fax: [phone]   RxID:   [id number]

## 2010-12-02 ENCOUNTER — Other Ambulatory Visit: Payer: Self-pay

## 2010-12-09 ENCOUNTER — Ambulatory Visit: Payer: Self-pay | Admitting: Internal Medicine

## 2010-12-09 ENCOUNTER — Telehealth (INDEPENDENT_AMBULATORY_CARE_PROVIDER_SITE_OTHER): Payer: Self-pay | Admitting: *Deleted

## 2010-12-09 ENCOUNTER — Ambulatory Visit (INDEPENDENT_AMBULATORY_CARE_PROVIDER_SITE_OTHER): Admitting: Pulmonary Disease

## 2010-12-09 ENCOUNTER — Encounter: Payer: Self-pay | Admitting: Pulmonary Disease

## 2010-12-09 DIAGNOSIS — G4733 Obstructive sleep apnea (adult) (pediatric): Secondary | ICD-10-CM

## 2010-12-18 NOTE — Progress Notes (Signed)
Summary: sleep study  Phone Note Other Incoming Call back at [phone]   Caller: [email or web address] Summary of Call: Need copy of pt's sleep study and titration report faxed to [phone]. Initial call taken by: Darletta Moll,  December 09, 2010 3:35 PM  Follow-up for Phone Call        pt switched DME companies from [facility] to [facility] and therefore they are needing a copy of pt's sleep study and titration study.  faxed these records to [facility].  Arman Filter LPN  December 09, 2010 4:44 PM

## 2010-12-24 NOTE — Assessment & Plan Note (Signed)
Summary: rov for osa   Copy to:  Philemon Kingdom Primary Provider/Referring Provider:  Prochnau  CC:  F/u appt.   Pt states she is wearing her cpap machine every night.  Approx 8 hours per night.  Pt states she is due for a new mask.  Pt states her breathing has improved since last year.  States occ. cough with sputum but is unsure of the color.  .  History of Present Illness: the pt comes in today for f/u of her known osa.  She is wearing cpap compliantly, and continues to have great benefit wrt her sleep and daytime alertness.  She is having no issues with her pressure, but is due for a new mask.  Her download shows 100% compliance, but does have mask leaks probably due to the age of her mask.  Overall, she is very happy with her sleep at this time.  Current Medications (verified): 1)  Aspirin Low Dose 81 Mg  Tabs (Aspirin) .... Take 1 Tablet Once Daily 2)  Metoprolol Succinate 50 Mg Xr24h-Tab (Metoprolol Succinate) .... Take 1 Tablet By Mouth Two Times A Day 3)  Levothroid 50 Mcg  Tabs (Levothyroxine Sodium) .... Take 1 Tablet By Mouth Once A Day 4)  Singulair 10 Mg  Tabs (Montelukast Sodium) .... Take 1 Tab By Mouth At Bedtime 5)  Ipratropium Bromide 0.03 %  Soln (Ipratropium Bromide) .... Inhale 2 Spray in Each Nostril 2 To 3 Times Per Day For Runny Nose As Needed 6)  Patanase 0.6 %  Soln (Olopatadine Hcl) .... Inhale 2 Puffs in Each Nostril Two Times A Day 7)  Proventil Hfa 108 (90 Base) Mcg/act  Aers (Albuterol Sulfate) .[name].. 1-2 Puffs Every 4-6 Hours As Needed 8)  Meclizine Hcl 25 Mg  Tabs (Meclizine Hcl) .... Take 1 Tab By Mouth As Needed For Dizziness (Take With Diazepam) 9)  Diazepam 5 Mg  Tabs (Diazepam) .... Take 1 Tab By Mouth As Needed For Dizziness. (Take With Meclizine) 10)  Estradiol 0.1 Mg/24hr Ptwk (Estradiol) .... Use As Directed 11)  Cyanocobalamin 1000 Mcg/ml Soln (Cyanocobalamin) .[name].. 1 Shot Every 4-6 Weeks 12)  Fish Oil Maximum Strength 1200 Mg Caps (Omega-3 Fatty  Acids) .... Take 1 To 2 Tabs By Mouth Daily 13)  Cpap 13 [employer] 14)  Replacement Cpap Mask of Choice and Supplies 15)  Zyrtec Allergy 10 Mg Caps (Cetirizine Hcl) .... Take 1 Tablet By Mouth Once A Day 16)  Metrogel 1 % Gel (Metronidazole) .... Use As Directed 17)  Renova 0.02 % Crea (Tretinoin (Facial Wrinkles)) .... Use As Directed 18)  Premarin Cream .... Use Weekly 19)  Famotidine 10 Mg Tabs (Famotidine) .... Take 1 Tablet By Mouth Once A Day 20)  Simvastatin 40 Mg Tabs (Simvastatin) .... Take 1 Tablet By Mouth Once A Day 21)  Calcium + D + K 750-500-40 Mg-Unt-Mcg Tabs (Calcium-Vitamin D-Vitamin K) .... Take 1 To 2 Tabs By Mouth Daily 22)  Centrum Silver  Tabs (Multiple Vitamins-Minerals) .... Take 1 Tablet By Mouth Once A Day 23)  L-Carnitine 500 Mg Tabs (Levocarnitine) .... Take 1 Tablet By Mouth Once A Day 24)  Cla [phone] Mg Caps (Linoleic Acid-Sunflower Oil) .... Take 1 Tablet By Mouth Once A Day 25)  Raspberry Ketones 200mg  .... Take 1 Tablet By Mouth Once A Day 26)  Spirulina 500 Mg Tabs (Spirulina) .... Take 1 Tablet By Mouth Once A Day 27)  Astragalus Root  Powd (Tragacanth) .... Take 1 Tablet By Mouth Once  A Day 28)  White Kidney Bean Extract  Powd (White Kidney Bean Extract) 29)  Coq10  Allergies (verified): 1)  ! Codeine 2)  ! Sulfa 3)  ! Augmentin  Review of Systems       The patient complains of productive cough, non-productive cough, and tooth/dental problems.  The patient denies coughing up blood, chest pain, irregular heartbeats, loss of appetite, weight change, abdominal pain, difficulty swallowing, sore throat, headaches, nasal congestion/difficulty breathing through nose, sneezing, itching, ear ache, anxiety, depression, hand/feet swelling, joint stiffness or pain, rash, change in color of mucus, and fever.    Vital Signs:  Patient profile:   60 year old female Height:      65.5 inches Weight:      198.50 pounds BMI:     32.65 O2 Sat:      95 %  on Room air Temp:     98.1 degrees F oral Pulse rate:   73 / minute BP sitting:   108 / 70  (left arm) Cuff size:   large  Vitals Entered By: Arman Filter LPN (December 09, 2010 10:29 AM)  O2 Flow:  Room air CC: F/u appt.   Pt states she is wearing her cpap machine every night.  Approx 8 hours per night.  Pt states she is due for a new mask.  Pt states her breathing has improved since last year.  States occ. cough with sputum but is unsure of the color.   Comments Medications reviewed with patient Arman Filter LPN  December 09, 2010 10:29 AM    Physical Exam  General:  ow female in nad Nose:  no skin breakdown or pressure necrosis from cpap mask Extremities:  no edema or cyanosis  Neurologic:  alert, does not appear sleepy, moves all 4.   Impression & Recommendations:  Problem # 1:  OBSTRUCTIVE SLEEP APNEA (ICD-327.23) the pt is doing well with cpap, and I have asked her to keep up with mask changes and supplies.  She also needs to work aggressively on weight loss.  She is to call me if any issues arise with her cpap, and to f/u with me in one year.  Medications Added to Medication List This Visit: 1)  Metoprolol Succinate 50 Mg Xr24h-tab (Metoprolol succinate) .... Take 1 tablet by mouth two times a day 2)  Fish Oil 1000 Mg Caps (Omega-3 fatty acids) .... Take 1 tablet by mouth once a day 3)  Fish Oil Maximum Strength 1200 Mg Caps (Omega-3 fatty acids) .... Take 1 to 2 tabs by mouth daily 4)  Simvastatin 40 Mg Tabs (Simvastatin) .... Take 1 tablet by mouth once a day 5)  Calcium + D + K 750-500-40 Mg-unt-mcg Tabs (Calcium-vitamin d-vitamin k) .... Take 1 to 2 tabs by mouth daily 6)  Centrum Silver Tabs (Multiple vitamins-minerals) .... Take 1 tablet by mouth once a day 7)  L-carnitine 500 Mg Tabs (Levocarnitine) .... Take 1 tablet by mouth once a day 8)  Cla [phone] Mg Caps (Linoleic acid-sunflower oil) .... Take 1 tablet by mouth once a day 9)  Raspberry Ketones 200mg   ....  Take 1 tablet by mouth once a day 10)  Spirulina 500 Mg Tabs (Spirulina) .... Take 1 tablet by mouth once a day 11)  Astragalus Root Powd (Tragacanth) .... Take 1 tablet by mouth once a day 12)  White Kidney Bean Extract Powd (White kidney bean extract) 13)  Coq10   Other Orders: Est. Patient Level III (86578) DME Referral (  DME)  Patient Instructions: 1)  will send an order to your dme to get you a new mask 2)  work on weight loss 3)  followup with me in one year

## 2011-01-15 ENCOUNTER — Encounter: Payer: Self-pay | Admitting: *Deleted

## 2011-01-21 ENCOUNTER — Ambulatory Visit: Payer: Self-pay | Admitting: Internal Medicine

## 2011-01-29 ENCOUNTER — Ambulatory Visit: Payer: Self-pay | Admitting: Internal Medicine

## 2011-10-06 ENCOUNTER — Ambulatory Visit (INDEPENDENT_AMBULATORY_CARE_PROVIDER_SITE_OTHER): Admitting: Pulmonary Disease

## 2011-10-06 ENCOUNTER — Encounter: Payer: Self-pay | Admitting: Pulmonary Disease

## 2011-10-06 DIAGNOSIS — R0989 Other specified symptoms and signs involving the circulatory and respiratory systems: Secondary | ICD-10-CM

## 2011-10-06 DIAGNOSIS — G4733 Obstructive sleep apnea (adult) (pediatric): Secondary | ICD-10-CM

## 2011-10-06 DIAGNOSIS — R0609 Other forms of dyspnea: Secondary | ICD-10-CM

## 2011-10-06 DIAGNOSIS — J45909 Unspecified asthma, uncomplicated: Secondary | ICD-10-CM | POA: Insufficient documentation

## 2011-10-06 DIAGNOSIS — R0600 Dyspnea, unspecified: Secondary | ICD-10-CM

## 2011-10-06 HISTORY — DX: Unspecified asthma, uncomplicated: J45.909

## 2011-10-06 NOTE — Assessment & Plan Note (Signed)
The patient has been having increased shortness of breath, spells at night where she is awakening with atypical chest discomfort and shortness of breath, and chest tightness during the day as well.  Her spirometry today shows very mild obstruction, and therefore the diagnosis of asthma has to be entertained.  This could also be reflux disease that is leading to reactive airways.  I would like to try her on inhaled corticosteroids for the next few weeks, and see if she has improvement.  If she does not, would double her proton pump inhibitor to see if this takes care of her symptoms.

## 2011-10-06 NOTE — Patient Instructions (Signed)
Will get you a new cpap mask, and continue to work on weight loss Will start qvar 2 inhalations am and pm everyday for the next 2 weeks.  Rinse mouth out well.  Please call me in 2 weeks with your response. If doing well, would like to see you back in 2 mos to check on breathing.

## 2011-10-06 NOTE — Progress Notes (Signed)
  Subjective:    Patient ID: Angela Mendoza, female    DOB: Apr 25, 1951, 60 y.o.   MRN: [id number]  HPI Patient comes in today for followup of her known obstructive sleep apnea.  She's been wearing her CPAP compliantly, but is due for a new mask.  Having issues last few months with awakening at night feeling like she cannot get her breath, as well as nocturnal cough and also chest tightness.  She does have a history of reflux, and is having breakthrough symptoms despite being on a proton pump inhibitor once a day.  We have never formally seen her for a pulmonary consultation, but she has seen the nurse practitioner with spirometry that has always been normal.  I have also reviewed spirometry from [facility] that are also normal as well.  She carries the diagnosis of "asthma", however this has never been documented.   Review of Systems  Constitutional: Negative for fever and unexpected weight change.  HENT: Positive for rhinorrhea, sneezing, postnasal drip and sinus pressure. Negative for ear pain, nosebleeds, congestion, sore throat, trouble swallowing and dental problem.   Eyes: Positive for redness and itching.  Respiratory: Positive for cough and shortness of breath. Negative for chest tightness and wheezing.   Cardiovascular: Positive for palpitations. Negative for leg swelling.  Gastrointestinal: Negative for nausea and vomiting.  Genitourinary: Negative for dysuria.  Musculoskeletal: Positive for joint swelling.  Skin: Negative for rash.  Neurological: Positive for headaches.  Hematological: Bruises/bleeds easily.  Psychiatric/Behavioral: Negative for dysphoric mood. The patient is not nervous/anxious.        Objective:   Physical Exam Overweight female in no acute distress Nose without purulence or discharge noted Chest totally clear to auscultation, no wheezes or rhonchi Cardiac exam with regular rate and rhythm, no murmurs rubs or gallops. Lower extremities without edema,  no cyanosis noted Alert, does not appear to be sleepy, moves all 4 extremities.       Assessment & Plan:

## 2011-10-06 NOTE — Assessment & Plan Note (Signed)
The patient's family today shows excellent compliance, and adequate control of her AHI.  However, she did have increased leak that I suspect is due to her aged mask.  The patient will get a new CPAP mask, and I have asked her to also work aggressively on weight loss.  I have also raised the question whether her awakenings at night with breathing issues could be related to breakthrough apneas.

## 2011-10-15 ENCOUNTER — Telehealth: Payer: Self-pay | Admitting: Pulmonary Disease

## 2011-10-15 NOTE — Telephone Encounter (Signed)
She has to do a better job rinsing, and can gargle then swallow. Ok to send in qvar prescription  Nystatin 400,000 IU swish and swallow tid for 5 days.  QS, no fills.

## 2011-10-15 NOTE — Telephone Encounter (Signed)
Spoke with pt.  She states that the qvar has helped her breathing tremendously. She is sleeping better and does not wake up at night gasping for air. She wants a 90 day supply with 3 rf to take to [place] (she will pick up).  She also states that despite rinsing mouth well and gargling after inhaler use, she is developing thrush- has white, sore patches in her mouth and wants to have something called in for this. Please advise, thanks!

## 2011-10-16 MED ORDER — NYSTATIN 100000 UNIT/ML MT SUSP: 400000.0000 [IU] | Freq: Three times a day (TID) | OROMUCOSAL | Status: AC

## 2011-10-16 MED ORDER — BECLOMETHASONE DIPROPIONATE 80 MCG/ACT IN AERS: 2.0000 | INHALATION_SPRAY | Freq: Two times a day (BID) | RESPIRATORY_TRACT | Status: DC

## 2011-10-16 NOTE — Telephone Encounter (Signed)
Pt returned call. Per mindy- nurse will call back in "5" mins. Angela Mendoza

## 2011-10-16 NOTE — Telephone Encounter (Signed)
LMTCB

## 2011-10-16 NOTE — Telephone Encounter (Signed)
I spoke with pt and made her aware of KC recs. She voiced her understanding. Rx for qvar has been printed off so she can p/u. Rx for nystatin has been sent to pharmacy.

## 2011-10-28 DIAGNOSIS — K5790 Diverticulosis of intestine, part unspecified, without perforation or abscess without bleeding: Secondary | ICD-10-CM

## 2011-10-28 HISTORY — PX: CARDIOVASCULAR STRESS TEST: SHX262

## 2011-10-28 HISTORY — DX: Diverticulosis of intestine, part unspecified, without perforation or abscess without bleeding: K57.90

## 2011-12-08 ENCOUNTER — Ambulatory Visit: Admitting: Pulmonary Disease

## 2011-12-11 ENCOUNTER — Encounter: Payer: Self-pay | Admitting: Pulmonary Disease

## 2011-12-11 ENCOUNTER — Ambulatory Visit (INDEPENDENT_AMBULATORY_CARE_PROVIDER_SITE_OTHER): Admitting: Pulmonary Disease

## 2011-12-11 DIAGNOSIS — G4733 Obstructive sleep apnea (adult) (pediatric): Secondary | ICD-10-CM

## 2011-12-11 DIAGNOSIS — J45909 Unspecified asthma, uncomplicated: Secondary | ICD-10-CM

## 2011-12-11 NOTE — Progress Notes (Signed)
  Subjective:    Patient ID: Angela Mendoza, female    DOB: [birth year], 61 y.o.   MRN: [id number]  HPI The patient comes in today for followup of her breathing issues and also sleep apnea.  She is wearing CPAP compliantly, and is having no issues with pressure or mask fit.  At the last visit, she was noted to have mild airflow obstruction on spirometry, and was started on Qvar for probable asthma.  She has seen a significant improvement in her breathing, and a decreased frequency of breathing issues at night.  She is not having to use her rescue inhaler.   Review of Systems  Constitutional: Negative for fever and unexpected weight change.  HENT: Positive for ear pain and sinus pressure. Negative for nosebleeds, congestion, sore throat, rhinorrhea, sneezing, trouble swallowing, dental problem and postnasal drip.   Eyes: Negative for redness and itching.  Respiratory: Positive for chest tightness, shortness of breath and wheezing. Negative for cough.   Cardiovascular: Negative for palpitations and leg swelling.  Gastrointestinal: Negative for nausea and vomiting.  Genitourinary: Negative for dysuria.  Musculoskeletal: Negative for joint swelling.  Skin: Negative for rash.  Neurological: Positive for headaches.  Hematological: Does not bruise/bleed easily.  Psychiatric/Behavioral: Negative for dysphoric mood. The patient is not nervous/anxious.        Objective:   Physical Exam Overweight female in no acute distress No skin breakdown or pressure necrosis from the CPAP mask Chest totally clear to auscultation, no wheezes or rhonchi Cardiac exam with regular rate and rhythm, no murmurs rubs or gallops Lower extremities without edema, no cyanosis Alert and oriented, moves all 4 extremities.       Assessment & Plan:

## 2011-12-11 NOTE — Patient Instructions (Signed)
No change in medications or cpap machine Work on weight reduction followup with me in 6mos for your breathing.

## 2011-12-11 NOTE — Assessment & Plan Note (Signed)
The patient has seen improvement in her breathing and nocturnal episodes since being on Qvar.  I've asked her to continue on this, and to let us know if she has increased breathing issues or increased rescue inhaler need.

## 2011-12-11 NOTE — Assessment & Plan Note (Signed)
The patient is doing well on CPAP, and I've encouraged her to work on weight loss.

## 2012-02-11 ENCOUNTER — Encounter: Payer: Self-pay | Admitting: Internal Medicine

## 2012-03-05 ENCOUNTER — Ambulatory Visit: Admitting: Internal Medicine

## 2012-04-21 ENCOUNTER — Encounter: Payer: Self-pay | Admitting: Internal Medicine

## 2012-05-18 ENCOUNTER — Ambulatory Visit: Admitting: Internal Medicine

## 2012-05-26 ENCOUNTER — Ambulatory Visit: Admitting: Internal Medicine

## 2012-06-09 ENCOUNTER — Ambulatory Visit: Admitting: Pulmonary Disease

## 2012-07-12 ENCOUNTER — Encounter: Payer: Self-pay | Admitting: Internal Medicine

## 2012-07-20 ENCOUNTER — Ambulatory Visit: Admitting: Internal Medicine

## 2012-08-27 HISTORY — PX: ESOPHAGOGASTRODUODENOSCOPY: SHX1529

## 2012-08-27 HISTORY — PX: COLONOSCOPY: SHX174

## 2012-09-02 LAB — HM COLONOSCOPY

## 2012-09-07 ENCOUNTER — Telehealth: Payer: Self-pay | Admitting: Internal Medicine

## 2012-09-07 NOTE — Telephone Encounter (Signed)
Spoke to patient.  She will get all records from surgeon's office and previous GI doctor as well.  Will have records reviewed before appt is made.

## 2012-09-21 ENCOUNTER — Telehealth: Payer: Self-pay | Admitting: Pulmonary Disease

## 2012-09-21 ENCOUNTER — Encounter: Payer: Self-pay | Admitting: Pulmonary Disease

## 2012-09-21 ENCOUNTER — Ambulatory Visit (INDEPENDENT_AMBULATORY_CARE_PROVIDER_SITE_OTHER): Admitting: Pulmonary Disease

## 2012-09-21 VITALS — BP 126/82 | HR 76 | Temp 97.3°F | Ht 65.5 in | Wt 190.0 lb

## 2012-09-21 DIAGNOSIS — G4733 Obstructive sleep apnea (adult) (pediatric): Secondary | ICD-10-CM

## 2012-09-21 DIAGNOSIS — J45909 Unspecified asthma, uncomplicated: Secondary | ICD-10-CM

## 2012-09-21 MED ORDER — BECLOMETHASONE DIPROPIONATE 80 MCG/ACT IN AERS: 2.0000 | INHALATION_SPRAY | Freq: Two times a day (BID) | RESPIRATORY_TRACT | Status: DC

## 2012-09-21 NOTE — Progress Notes (Signed)
  Subjective:    Patient ID: Angela Mendoza, female    DOB: 28-Jul-1951, 61 y.o.   MRN: [id number]  HPI The patient comes in today for followup of her known asthma, and also her obstructive sleep apnea.  She was having issues with thrush and discontinued her Qvar.  Her breathing has been fairly stable, but she has noticed some increased asthma symptoms with her exercise program.  She's been wearing CPAP compliantly, but is continuing to have issues with leaks by her download.  Otherwise her sleep apnea seems to be well-controlled with respect to her AHI and compliance.   Review of Systems  Constitutional: Negative for fever and unexpected weight change.  HENT: Positive for ear pain, congestion, rhinorrhea, sneezing, postnasal drip and sinus pressure. Negative for nosebleeds, sore throat, trouble swallowing and dental problem.   Eyes: Positive for redness and itching.  Respiratory: Negative for cough, chest tightness, shortness of breath and wheezing.   Cardiovascular: Negative for palpitations and leg swelling.  Gastrointestinal: Positive for nausea. Negative for vomiting.  Genitourinary: Negative for dysuria.  Musculoskeletal: Negative for joint swelling.  Skin: Negative for rash.  Neurological: Negative for headaches.  Hematological: Does not bruise/bleed easily.  Psychiatric/Behavioral: Negative for dysphoric mood. The patient is not nervous/anxious.        Objective:   Physical Exam Overweight female in no acute distress Nose without purulent discharge noted No skin breakdown or pressure necrosis from the CPAP mask Lower extremities without edema, no cyanosis Alert and oriented, moves all 4 extremities.       Assessment & Plan:

## 2012-09-21 NOTE — Assessment & Plan Note (Signed)
The patient is doing very well with CPAP, with her download showing excellent control and compliance.  She is having excessive air leak on her current mask, and I think she would benefit from a fitting at the sleep Center.  I have also encouraged her to work aggressively on weight loss.

## 2012-09-21 NOTE — Patient Instructions (Addendum)
Try getting back on qvar  2 inhalations am and pm.  Rinse well after using.  If you are having issues with thrush, you need to call me so we can look at alternatives. Will have your dme try and get new cpap machine.  Will also get mask fitting at sleep center. Work on weight loss followup with me in 6mos for your breathing issues.

## 2012-09-21 NOTE — Assessment & Plan Note (Signed)
The patient has discontinued her Qvar because of thrush issues, but has seen some worsening of her exertional tolerance.  I have stressed to her the importance of being on inhaled corticosteroids, and would like for her to try the Qvar again with thorough rinsing.  If she continues to have issues, will change to a different medication.

## 2012-09-22 NOTE — Telephone Encounter (Signed)
Needs order for cpap supplies thanks Tobe Sos

## 2012-09-22 NOTE — Telephone Encounter (Signed)
Order placed. Pt aware.

## 2012-09-27 ENCOUNTER — Telehealth: Payer: Self-pay | Admitting: Pulmonary Disease

## 2012-09-28 ENCOUNTER — Ambulatory Visit (HOSPITAL_BASED_OUTPATIENT_CLINIC_OR_DEPARTMENT_OTHER): Attending: Pulmonary Disease

## 2012-09-28 DIAGNOSIS — G4733 Obstructive sleep apnea (adult) (pediatric): Secondary | ICD-10-CM

## 2012-09-29 NOTE — Telephone Encounter (Signed)
Will forward to [facility] as FYI and advise if anything needs to be taken care on current CPAP.

## 2012-10-01 NOTE — Telephone Encounter (Signed)
KC, do we need to do anything further for this pt?

## 2012-12-02 ENCOUNTER — Telehealth: Payer: Self-pay | Admitting: Pulmonary Disease

## 2012-12-02 NOTE — Telephone Encounter (Signed)
lmomtcb  

## 2012-12-02 NOTE — Telephone Encounter (Signed)
The pt will be traveling to [place] next month and wants to carry her cpap and medications with her on the flight, she does not want to check these items. I suggested that when she books the flight she should find out if there are any forms the airline will require for the cpap or medications and once she knows we will send the msg to [facility]. The pt is also concerned about the altitude of one of the towns they will be in and wonders if the cpap setting will need to be changed in any way. She will giv the altitude information as well when she calls back. We will close this msg and wait on all the information before sending to [facility].

## 2012-12-02 NOTE — Telephone Encounter (Signed)
Returning call.

## 2012-12-07 LAB — VITAMIN D 25 HYDROXY (VIT D DEFICIENCY, FRACTURES): Vit D, 25-Hydroxy: 42.9

## 2012-12-07 LAB — CBC
HGB: 13.7 g/dL
WBC: 7.1
platelet count: 273

## 2012-12-07 LAB — ANA: Anti Nuclear Antibody(ANA): NEGATIVE

## 2012-12-07 LAB — SEDIMENTATION RATE: Sed Rate: 19 mm

## 2012-12-24 ENCOUNTER — Encounter: Payer: Self-pay | Admitting: Pulmonary Disease

## 2012-12-24 ENCOUNTER — Ambulatory Visit (INDEPENDENT_AMBULATORY_CARE_PROVIDER_SITE_OTHER): Admitting: Pulmonary Disease

## 2012-12-24 VITALS — BP 118/80 | HR 71 | Temp 98.3°F | Ht 65.5 in | Wt 192.0 lb

## 2012-12-24 DIAGNOSIS — G4733 Obstructive sleep apnea (adult) (pediatric): Secondary | ICD-10-CM

## 2012-12-24 DIAGNOSIS — J45909 Unspecified asthma, uncomplicated: Secondary | ICD-10-CM

## 2012-12-24 MED ORDER — BENZONATATE 100 MG PO CAPS: 100.0000 mg | ORAL_CAPSULE | Freq: Four times a day (QID) | ORAL | Status: DC | PRN

## 2012-12-24 MED ORDER — PREDNISONE 10 MG PO TABS: ORAL_TABLET | ORAL | Status: DC

## 2012-12-24 NOTE — Assessment & Plan Note (Signed)
Patient is doing well with CPAP with no significant issues.  She is due for a new mask and supplies, and I have also encouraged her to work aggressively on weight loss.

## 2012-12-24 NOTE — Patient Instructions (Addendum)
Will send an order to your dme for a new mask and supplies. Stay on qvar, with albuterol for rescue Will treat with a short course of prednisone for persistent airway inflammation Can take mucinex dm twice a day to see if helps, and would highly recommend neil med sinus rinses am and pm until better. Will call in prescription for tessalon pearls 100mg , 2 every 6 hrs if needed for cough. Stay on flonase nasal spray 2 each nostril each am only.  followup with me in 6mos, and cancel redundant apptms.

## 2012-12-24 NOTE — Assessment & Plan Note (Signed)
The patient is getting over a recent episode of acute asthmatic bronchitis, and although no longer feel she is infected, I suspect she still has some residual airway inflammation.  She is concerned about a plane ride out of the country in 3 weeks, and therefore will treat her with another short course of prednisone to return her more rapidly to baseline.  She has done well with the Qvar, and I have asked her to continue this as her maintenance medication.

## 2012-12-24 NOTE — Progress Notes (Signed)
  Subjective:    Patient ID: Angela Mendoza, female    DOB: Apr 21, 1951, 62 y.o.   MRN: [id number]  HPI Patient comes in today for an acute sick visit.  She has a history of known asthma, and recently had an episode of acute sinusitis and acute asthmatic bronchitis.  She's been treated with 2 different antibiotics, as well as one round of prednisone.  Although she is much improved, she still has some persistent nasal congestion as well as chest congestion.  She is no longer bringing up purulent mucus, but feels some tightness in her chest at times and dyspnea that has not returned to baseline.  She has not had any fevers or chills.  She is wearing CPAP compliantly, and is due for a new mask and supplies.  She feels that she is sleeping well, and is satisfied with her daytime alertness.   Review of Systems  Constitutional: Negative for fever and unexpected weight change.  HENT: Positive for congestion, rhinorrhea, sneezing, postnasal drip and sinus pressure. Negative for ear pain, nosebleeds, sore throat, trouble swallowing and dental problem.   Eyes: Negative for redness and itching.  Respiratory: Positive for cough and shortness of breath. Negative for chest tightness and wheezing.   Cardiovascular: Positive for palpitations. Negative for leg swelling.  Gastrointestinal: Negative for nausea and vomiting.  Genitourinary: Negative for dysuria.  Musculoskeletal: Negative for joint swelling.  Skin: Negative for rash.  Neurological: Negative for headaches.  Hematological: Does not bruise/bleed easily.  Psychiatric/Behavioral: Negative for dysphoric mood. The patient is not nervous/anxious.        Objective:   Physical Exam Overweight female in no acute distress Nose with purulent discharge noted No skin breakdown or pressure necrosis from the CPAP mask Chest totally clear to auscultation, no wheezing Cardiac exam with regular rate and rhythm Lower extremities without edema, no  cyanosis Alert and oriented, does not appear to be sleepy, moves all 4 extremities.       Assessment & Plan:

## 2013-03-22 ENCOUNTER — Ambulatory Visit: Admitting: Pulmonary Disease

## 2013-03-23 ENCOUNTER — Ambulatory Visit: Admitting: Pulmonary Disease

## 2013-04-05 ENCOUNTER — Emergency Department: Payer: Self-pay | Admitting: Emergency Medicine

## 2013-04-12 LAB — COMPREHENSIVE METABOLIC PANEL
ALT: 27 U/L (ref 7–35)
AST: 22 U/L
Alkaline Phosphatase: 50 U/L
Creat: 0.96
Sodium: 138 mmol/L (ref 137–147)
Total Bilirubin: 0.9 mg/dL

## 2013-04-12 LAB — LIPID PANEL
Cholesterol: 187 mg/dL (ref 0–200)
HDL: 54 mg/dL (ref 35–70)
LDL (calc): 93
Triglycerides: 199

## 2013-04-12 LAB — CHG ASSAY OF CK (CPK): Total CK: 216 U/L — AB (ref ?–170.0)

## 2013-04-12 LAB — TSH: TSH: 2.147

## 2013-06-24 ENCOUNTER — Ambulatory Visit: Admitting: Pulmonary Disease

## 2013-08-17 ENCOUNTER — Telehealth: Payer: Self-pay | Admitting: Pulmonary Disease

## 2013-08-17 DIAGNOSIS — G4733 Obstructive sleep apnea (adult) (pediatric): Secondary | ICD-10-CM

## 2013-08-17 NOTE — Telephone Encounter (Signed)
Pt aware. Order has been sent to [facility]. Staff message sent to [facility]

## 2013-08-17 NOTE — Telephone Encounter (Signed)
I called and spoke with pt. She reports she is needing CPAP supplies from [facility]. She is scheduled to come in December for OV. She will keep appt. She has had her current machine since 2008. She would like an order for a new CPAP machine as well. If okay to send in this order, what type of machine would you like pt to get? Please advise KC thanks

## 2013-08-17 NOTE — Telephone Encounter (Signed)
Insurance may or may not allow her to get a new machine.  It is only 62 yrs old, and they are becoming more strict about this.  If able, needs s9 escape/auto with h/h at her usual pressure.  Make sure she knows she MUST keep followup visit with me.

## 2013-08-18 ENCOUNTER — Telehealth: Payer: Self-pay | Admitting: Pulmonary Disease

## 2013-08-18 DIAGNOSIS — G4733 Obstructive sleep apnea (adult) (pediatric): Secondary | ICD-10-CM

## 2013-08-18 NOTE — Telephone Encounter (Signed)
I spoke with [facility]. There is no documentation of what pt is set on for CPAP. We last have from 2009. She asks if we can place an order so they can check her machine and confirm settings. I have done so. Nothing further needed

## 2013-08-25 ENCOUNTER — Telehealth: Payer: Self-pay | Admitting: Pulmonary Disease

## 2013-08-25 NOTE — Telephone Encounter (Signed)
Spoke with Barbara Cower with [facility]-- Pt seen in store today to have pressure checked on machine as well as have machine checked to insure working properly. Pt pressure setting is 13cm H20  Machine functioning properly. Barbara Cower did note that patient "green-- ON indicator light" on machine is burnt out but that machines humidifier is working. Patient did not get new machine d/t hers being in good working order.  Pt interested in trying the Airfit P10, N10, FNP full mask at next mask fitting with [facility].  Will send to [facility] as FYI since he was requesting the pressure setting in 08/18/13 order. Please advise Dr Shelle Iron if anything further needs to be done. Thanks.

## 2013-08-29 ENCOUNTER — Encounter: Payer: Self-pay | Admitting: Pulmonary Disease

## 2013-09-05 NOTE — Telephone Encounter (Signed)
noted 

## 2013-09-14 ENCOUNTER — Encounter: Payer: Self-pay | Admitting: Pulmonary Disease

## 2013-09-14 ENCOUNTER — Encounter (INDEPENDENT_AMBULATORY_CARE_PROVIDER_SITE_OTHER): Payer: Self-pay

## 2013-09-14 ENCOUNTER — Ambulatory Visit (INDEPENDENT_AMBULATORY_CARE_PROVIDER_SITE_OTHER): Admitting: Pulmonary Disease

## 2013-09-14 ENCOUNTER — Other Ambulatory Visit: Payer: Self-pay | Admitting: Pulmonary Disease

## 2013-09-14 ENCOUNTER — Telehealth: Payer: Self-pay | Admitting: Pulmonary Disease

## 2013-09-14 VITALS — BP 136/88 | HR 76 | Temp 97.9°F | Ht 65.5 in | Wt 196.2 lb

## 2013-09-14 DIAGNOSIS — J45909 Unspecified asthma, uncomplicated: Secondary | ICD-10-CM

## 2013-09-14 DIAGNOSIS — G4733 Obstructive sleep apnea (adult) (pediatric): Secondary | ICD-10-CM

## 2013-09-14 NOTE — Telephone Encounter (Signed)
What a game of chicken or egg.  Can't actually see if she qualifies for a new machine until you order it to see if it happens to get turned down!! Order sent.

## 2013-09-14 NOTE — Telephone Encounter (Signed)
Angela Mendoza returned call.  Spoke with Angela Mendoza who reported that she believes patient will be able to get a new CPAP machine but will need a formal order requesting a new CPAP machine and supplies w/ pt's pressure setting (13cm per pt's chart).  Per order placed at ov today with [facility]: Comments    See phone string Pt is interested in new machine, and I ok'ed this way back if she qualified. Advanced since message saying her machine appears to be ok. What she wants to know is if she qualifies for a new machine thru insurance. Her current machine is 6 yrs. Old.   Order History Outpatient    Date/Time Action Taken User Additional Information   09/14/13 1211 Sign Barbaraann Share, MD    [facility] please advise if okay to place order for new CPAP machine and supplies.  Thank you.

## 2013-09-14 NOTE — Assessment & Plan Note (Signed)
The patient is sleeping well with CPAP, and is having no issues with her mask fit or pressure.  We will see if she qualifies for a new device, and I have asked her to increase the heat on her heated humidifier.  I have also encouraged her to work aggressively on weight loss.

## 2013-09-14 NOTE — Progress Notes (Signed)
  Subjective:    Patient ID: Angela Mendoza, female    DOB: [birth year], 62 y.o.   MRN: [id number]  HPI The patient comes in today for followup of her known asthma and sleep apnea.  She is wearing CPAP compliantly, and is having no issues with her mask fit or pressure.  She feels that she sleeps well, and is satisfied with her daytime alertness.  Her only complaint is that her machine is old, and apparently has a light burned out on the device.  We can see if she qualifies for a new machine.  She is having no issues with her asthma, and rarely uses her rescue inhaler.  Her exertional tolerance is at baseline, and she denies any significant cough.  She is having some purulent drainage from her nose when she does her sinus rinses, but she has just started back on her nasal hygiene regimen.  She does not feel that she has a frank sinus infection at this time.  I have asked her to consider increasing the heat on her humidifier since we are in the season that results in dry air.   Review of Systems  Constitutional: Negative for fever and unexpected weight change.  HENT: Positive for congestion and sinus pressure. Negative for dental problem, ear pain, nosebleeds, postnasal drip, rhinorrhea, sneezing, sore throat and trouble swallowing.   Eyes: Negative for redness and itching.  Respiratory: Negative for cough, chest tightness, shortness of breath and wheezing.   Cardiovascular: Negative for palpitations and leg swelling.  Gastrointestinal: Negative for nausea and vomiting.  Genitourinary: Negative for dysuria.  Musculoskeletal: Negative for joint swelling.  Skin: Negative for rash.  Neurological: Negative for headaches.  Hematological: Does not bruise/bleed easily.  Psychiatric/Behavioral: Negative for dysphoric mood. The patient is not nervous/anxious.        Objective:   Physical Exam Overweight female in no acute distress Nose without purulence or discharge noted No skin breakdown or pressure  necrosis from the CPAP mask Neck without lymphadenopathy or thyromegaly Chest totally clear to auscultation, mild upper airway pseudo-wheezing Cardiac exam with regular rate and rhythm Lower extremities without edema, no cyanosis Alert and oriented, moves all 4 extremities.        Assessment & Plan:

## 2013-09-14 NOTE — Telephone Encounter (Signed)
lmomtcb x1 for melissa 

## 2013-09-14 NOTE — Patient Instructions (Signed)
No change in medications Continue sinus rinses, and consider increasing the heat on your humidifier. Will give you the flu shot today. Will refer you to our patient care coordinators to investigate your cpap machine questions. Work on weight reduction followup with me again in 6mos

## 2013-09-14 NOTE — Assessment & Plan Note (Signed)
The patient is doing well from an asthma standpoint on inhaled corticosteroids alone.  She has not required her rescue inhaler.

## 2013-09-14 NOTE — Telephone Encounter (Signed)
Spoke with Melissa and is aware. Nothing further needed

## 2013-09-27 ENCOUNTER — Ambulatory Visit: Admitting: Pulmonary Disease

## 2013-11-02 ENCOUNTER — Telehealth: Payer: Self-pay | Admitting: Pulmonary Disease

## 2013-11-02 NOTE — Telephone Encounter (Signed)
[email or web address] x1 

## 2013-11-03 MED ORDER — ALBUTEROL SULFATE HFA 108 (90 BASE) MCG/ACT IN AERS: 2.0000 | INHALATION_SPRAY | Freq: Four times a day (QID) | RESPIRATORY_TRACT | Status: DC | PRN

## 2013-11-03 MED ORDER — BECLOMETHASONE DIPROPIONATE 80 MCG/ACT IN AERS: 2.0000 | INHALATION_SPRAY | Freq: Two times a day (BID) | RESPIRATORY_TRACT | Status: DC

## 2013-11-03 NOTE — Telephone Encounter (Signed)
I spoke with the pt and she is asking that rx for qvar and albuterol be sent to [facility]. I verified contact info with the pt and phone numbers matched so I sent rx electronically. Pt is aware.[place] Bing, CMA

## 2013-12-19 ENCOUNTER — Encounter: Payer: Self-pay | Admitting: Family Medicine

## 2013-12-19 ENCOUNTER — Ambulatory Visit (INDEPENDENT_AMBULATORY_CARE_PROVIDER_SITE_OTHER): Admitting: Family Medicine

## 2013-12-19 VITALS — BP 124/80 | HR 64 | Temp 98.2°F | Ht 65.25 in | Wt 194.2 lb

## 2013-12-19 DIAGNOSIS — J45909 Unspecified asthma, uncomplicated: Secondary | ICD-10-CM

## 2013-12-19 DIAGNOSIS — E8941 Symptomatic postprocedural ovarian failure: Secondary | ICD-10-CM

## 2013-12-19 DIAGNOSIS — Z23 Encounter for immunization: Secondary | ICD-10-CM

## 2013-12-19 DIAGNOSIS — K219 Gastro-esophageal reflux disease without esophagitis: Secondary | ICD-10-CM

## 2013-12-19 DIAGNOSIS — B370 Candidal stomatitis: Secondary | ICD-10-CM | POA: Insufficient documentation

## 2013-12-19 DIAGNOSIS — L851 Acquired keratosis [keratoderma] palmaris et plantaris: Secondary | ICD-10-CM

## 2013-12-19 DIAGNOSIS — E039 Hypothyroidism, unspecified: Secondary | ICD-10-CM | POA: Insufficient documentation

## 2013-12-19 DIAGNOSIS — N951 Menopausal and female climacteric states: Secondary | ICD-10-CM

## 2013-12-19 DIAGNOSIS — H04129 Dry eye syndrome of unspecified lacrimal gland: Secondary | ICD-10-CM

## 2013-12-19 DIAGNOSIS — J309 Allergic rhinitis, unspecified: Secondary | ICD-10-CM

## 2013-12-19 DIAGNOSIS — G4733 Obstructive sleep apnea (adult) (pediatric): Secondary | ICD-10-CM

## 2013-12-19 DIAGNOSIS — E785 Hyperlipidemia, unspecified: Secondary | ICD-10-CM

## 2013-12-19 DIAGNOSIS — R42 Dizziness and giddiness: Secondary | ICD-10-CM

## 2013-12-19 DIAGNOSIS — H04123 Dry eye syndrome of bilateral lacrimal glands: Secondary | ICD-10-CM | POA: Insufficient documentation

## 2013-12-19 DIAGNOSIS — F32A Depression, unspecified: Secondary | ICD-10-CM

## 2013-12-19 DIAGNOSIS — I10 Essential (primary) hypertension: Secondary | ICD-10-CM

## 2013-12-19 DIAGNOSIS — L859 Epidermal thickening, unspecified: Secondary | ICD-10-CM

## 2013-12-19 DIAGNOSIS — Z9989 Dependence on other enabling machines and devices: Secondary | ICD-10-CM

## 2013-12-19 DIAGNOSIS — F329 Major depressive disorder, single episode, unspecified: Secondary | ICD-10-CM

## 2013-12-19 DIAGNOSIS — E8940 Asymptomatic postprocedural ovarian failure: Secondary | ICD-10-CM

## 2013-12-19 DIAGNOSIS — F3289 Other specified depressive episodes: Secondary | ICD-10-CM

## 2013-12-19 DIAGNOSIS — F419 Anxiety disorder, unspecified: Secondary | ICD-10-CM | POA: Insufficient documentation

## 2013-12-19 DIAGNOSIS — Z8679 Personal history of other diseases of the circulatory system: Secondary | ICD-10-CM

## 2013-12-19 DIAGNOSIS — I499 Cardiac arrhythmia, unspecified: Secondary | ICD-10-CM

## 2013-12-19 DIAGNOSIS — R232 Flushing: Secondary | ICD-10-CM

## 2013-12-19 MED ORDER — UREA 10 % EX CREA: TOPICAL_CREAM | CUTANEOUS | Status: DC | PRN

## 2013-12-19 MED ORDER — NYSTATIN 100000 UNIT/ML MT SUSP: 5.0000 mL | Freq: Four times a day (QID) | OROMUCOSAL | Status: DC

## 2013-12-19 NOTE — Assessment & Plan Note (Signed)
Continue levothyroxine 

## 2013-12-19 NOTE — Assessment & Plan Note (Signed)
Chronic, stable. Continue chol meds.

## 2013-12-19 NOTE — Assessment & Plan Note (Signed)
Will treat with nystatin swish/swallow.

## 2013-12-19 NOTE — Progress Notes (Signed)
BP 124/80  Pulse 64  Temp(Src) 98.2 F (36.8 C) (Oral)  Ht 5' 5.25" (1.657 m)  Wt 194 lb 4 oz (88.111 kg)  BMI 32.09 kg/m2  SpO2 98%   CC: new pt to establish  Subjective:    Patient ID: Angela Mendoza, female    DOB: 07/23/1951, 63 y.o.   MRN: [id number]  HPI: Angela Mendoza is a 63 y.o. female presenting on 12/19/2013 with Establish Care   H/o intermittent palpitations, controlled with toprol xl 50mg  twice daily.  Has been on this since 2006.  She takes her pulse when she feels palpitations - and HR stays 60s.  Endorses worsening fatigue and some depressed mood recently, wonders of B blocker related.  H/o asthma - sees Dr. Gwenette Greet for this.  H/o silicon breast implants s/p leaking.  Some enlarged LAD from this as well as abnormal mammogram 2/2 this.  Dry skin on hands and feet.  Uses TCI cream daily on hands.  Preventative: Last CPE 1 yr ago. Colonoscopy - 07/2012 [employer] at [facility]) diverticulosis, no polyps. Also had endoscopy done. S/p complete hysterectomy for beign reason Mammogram - 10/2012 with normal biopsy - h/o silicon leak from implants in L breast. Flu 06/2013 Pneumovax 2010 Td 2010, Tdap today. zostavax 2011  Relevant past medical, surgical, family and social history reviewed and updated as indicated.  Allergies and medications reviewed and updated. Current Outpatient Prescriptions on File Prior to Visit  Medication Sig  . albuterol (PROVENTIL HFA) 108 (90 BASE) MCG/ACT inhaler Inhale 2 puffs into the lungs every 6 (six) hours as needed.  [name] aspirin 81 MG tablet Take 81 mg by mouth daily.    [name] azelastine (ASTELIN) 137 MCG/SPRAY nasal spray Place 1 spray into the nose 2 (two) times daily. Use in each nostril as directed  . beclomethasone (QVAR) 80 MCG/ACT inhaler Inhale 2 puffs into the lungs 2 (two) times daily.  . cetirizine (ZYRTEC) 10 MG tablet Take 10 mg by mouth daily as needed.   . conjugated estrogens (PREMARIN) vaginal cream Place  vaginally once a week.   . cyanocobalamin (,VITAMIN B-12,) 1000 MCG/ML injection Inject 1,000 mcg into the muscle every 30 (thirty) days.    . cycloSPORINE (RESTASIS) 0.05 % ophthalmic emulsion Place 1 drop into both eyes 2 (two) times daily.  . diazepam (VALIUM) 5 MG tablet Take 5 mg by mouth every 6 (six) hours as needed (vertigo).   Mariane Baumgarten Calcium (STOOL SOFTENER PO) Take 1 tablet by mouth as needed.  [name] esomeprazole (NEXIUM) 40 MG capsule Take 40 mg by mouth daily.    [name] estradiol (VIVELLE-DOT) 0.075 MG/24HR Place 1 patch onto the skin once a week.   . Levothyroxine Sodium 50 MCG CAPS Take 50 mcg by mouth daily.    . meclizine (ANTIVERT) 25 MG tablet Take 25 mg by mouth 3 (three) times daily as needed.  . metoprolol (TOPROL-XL) 50 MG 24 hr tablet Take 50 mg by mouth daily.   . montelukast (SINGULAIR) 10 MG tablet Take 10 mg by mouth at bedtime.    [name] omega-3 acid ethyl esters (LOVAZA) 1 G capsule Take 1-2 g by mouth 2 (two) times daily.   [name] Propylene Glycol (SYSTANE BALANCE) 0.6 % SOLN Apply 1 drop to eye as needed.  . psyllium (METAMUCIL) 58.6 % powder Take 1 packet by mouth as needed.  . Multiple Vitamins-Minerals (CENTRUM SILVER) tablet Take 1 tablet by mouth daily.     No current facility-administered medications on  file prior to visit.    Review of Systems Per HPI unless specifically indicated above    Objective:    BP 124/80  Pulse 64  Temp(Src) 98.2 F (36.8 C) (Oral)  Ht 5' 5.25" (1.657 m)  Wt 194 lb 4 oz (88.111 kg)  BMI 32.09 kg/m2  SpO2 98%  Physical Exam  Nursing note and vitals reviewed. Constitutional: She appears well-developed and well-nourished. No distress.  HENT:  Head: Normocephalic and atraumatic.  Mouth/Throat: Oropharynx is clear and moist. No oropharyngeal exudate.  White film on soft palate  Eyes: Conjunctivae and EOM are normal. Pupils are equal, round, and reactive to light. No scleral icterus.  Neck: Normal range of motion. Neck supple. Carotid  bruit is not present. No thyromegaly present.  Cardiovascular: Normal rate, regular rhythm, normal heart sounds and intact distal pulses.   No murmur heard. Pulmonary/Chest: Effort normal and breath sounds normal. No respiratory distress. She has no wheezes. She has no rales.  Musculoskeletal: She exhibits no edema.  Lymphadenopathy:    She has no cervical adenopathy.  Skin: Skin is warm and dry. No rash noted.  Bilateral soles with dry skin but no erythema, pruritis or rash.  Psychiatric: She has a normal mood and affect.   No results found for this or any previous visit.    Assessment & Plan:   Problem List Items Addressed This Visit   ABNORMAL HEART RHYTHMS     Will review records from cardiologist when they arrive.    Relevant Medications      simvastatin (ZOCOR) 40 MG tablet   ALLERGIC RHINITIS   ANGINA, HX OF     Will review records from cardiologist when they arrive.    Asthma, intrinsic     Continue f/u with Dr. Gwenette Greet.    Depression - Primary     Will further eval at next visit. For now, decrease Toprol XL frequency and reassess.    Dry eyes     Stable on current regimen of lubricating eye drops.    GERD (gastroesophageal reflux disease)     Stable on nexium.  Pt forgets doses.    Hot flashes   Hyperkeratosis of sole     Not consistent with tinea pedis today, treat with urea cream and pumice stone.    HYPERLIPIDEMIA     Chronic, stable. Continue chol meds.    HYPERTENSION     Chronic, stable on TOPROL XL.  Will do trial of QD dose.    Hypothyroidism     Continue levothyroxine.    Oral thrush     Will treat with nystatin swish/swallow.    Relevant Medications      nystatin (MYCOSTATIN) 100000 UNIT/ML suspension   OSA on CPAP     Continue CPAP use.    Surgical menopause     Pt working on slow taper off systemic estrogen.    Vertigo       Follow up plan: Return in about 2 months (around 02/16/2014), or as needed, for follow up visit.

## 2013-12-19 NOTE — Assessment & Plan Note (Signed)
Pt working on slow taper off systemic estrogen.

## 2013-12-19 NOTE — Assessment & Plan Note (Signed)
Will further eval at next visit. For now, decrease Toprol XL frequency and reassess.

## 2013-12-19 NOTE — Assessment & Plan Note (Signed)
Will review records from cardiologist when they arrive.

## 2013-12-19 NOTE — Progress Notes (Signed)
Pre visit review using our clinic review tool, if applicable. No additional management support is needed unless otherwise documented below in the visit note. 

## 2013-12-19 NOTE — Assessment & Plan Note (Signed)
Stable on current regimen of lubricating eye drops.

## 2013-12-19 NOTE — Patient Instructions (Addendum)
Sign release for records from prior PCP and [name] Cardiology at [facility] as well as [facility] Let's do trial of metoprolol succinate 50mg  once daily.  See if this helps fatigue and mood. Tdap today (tetanus and pertussis). For thrush - start nystatin swish and swallow 4 times daily - sent to your pharmacy. For feet try urea cream sent to pharmacy today. Return to see me in 1-2 months for follow up.

## 2013-12-19 NOTE — Assessment & Plan Note (Signed)
Chronic, stable on TOPROL XL.  Will do trial of QD dose.

## 2013-12-19 NOTE — Assessment & Plan Note (Signed)
Will review records from cardiologist when they arrive. 

## 2013-12-19 NOTE — Assessment & Plan Note (Signed)
Stable on nexium.  Pt forgets doses.

## 2013-12-19 NOTE — Assessment & Plan Note (Signed)
Continue CPAP use

## 2013-12-19 NOTE — Assessment & Plan Note (Signed)
Not consistent with tinea pedis today, treat with urea cream and pumice stone.

## 2013-12-19 NOTE — Assessment & Plan Note (Signed)
Continue f/u with Dr. Gwenette Greet.

## 2013-12-19 NOTE — Addendum Note (Signed)
Addended by: Royann Shivers A on: 12/19/2013 11:23 AM   Modules accepted: Orders

## 2013-12-21 ENCOUNTER — Encounter: Payer: Self-pay | Admitting: Internal Medicine

## 2013-12-21 ENCOUNTER — Ambulatory Visit (INDEPENDENT_AMBULATORY_CARE_PROVIDER_SITE_OTHER): Admitting: Internal Medicine

## 2013-12-21 VITALS — BP 124/78 | HR 88 | Temp 98.1°F | Wt 192.8 lb

## 2013-12-21 DIAGNOSIS — IMO0001 Reserved for inherently not codable concepts without codable children: Secondary | ICD-10-CM

## 2013-12-21 DIAGNOSIS — W5501XA Bitten by cat, initial encounter: Secondary | ICD-10-CM

## 2013-12-21 DIAGNOSIS — S91059A Open bite, unspecified ankle, initial encounter: Secondary | ICD-10-CM

## 2013-12-21 DIAGNOSIS — S81809A Unspecified open wound, unspecified lower leg, initial encounter: Secondary | ICD-10-CM

## 2013-12-21 DIAGNOSIS — S91009A Unspecified open wound, unspecified ankle, initial encounter: Secondary | ICD-10-CM

## 2013-12-21 DIAGNOSIS — S81009A Unspecified open wound, unspecified knee, initial encounter: Secondary | ICD-10-CM

## 2013-12-21 MED ORDER — AMOXICILLIN-POT CLAVULANATE 875-125 MG PO TABS: 1.0000 | ORAL_TABLET | Freq: Two times a day (BID) | ORAL | Status: DC

## 2013-12-21 NOTE — Patient Instructions (Addendum)

## 2013-12-21 NOTE — Progress Notes (Signed)
Subjective:    Patient ID: Angela Mendoza, female    DOB: 01-03-1951, 63 y.o.   MRN: [id number]  HPI  Pt presents to the clinic today with c/o cat bite. This occurred today. She reports that she stepped on the cat by accident and the cat bit her on the back of the left ankle. Her Tetanus is UTD. The cats rabies is UTD.  Review of Systems      Past Medical History  Diagnosis Date  . HYPERLIPIDEMIA 06/09/2008  . HYPERTENSION 06/09/2008  . ABNORMAL HEART RHYTHMS 06/09/2008  . HEADACHE, CHRONIC 06/09/2008  . ALLERGIC RHINITIS 06/09/2008  . Asthma, intrinsic 10/06/2011    [name] 09/2011:  Very mild airflow obstruction (FEV1% 68, FVL c/w obstruction)   . Depression   . GERD (gastroesophageal reflux disease)   . Hypothyroidism   . OSA on CPAP   . Dry eyes   . Surgical menopause 1991  . Vertigo   . Hot flashes   . Diverticulosis     h/o diverticulotis    Current Outpatient Prescriptions  Medication Sig Dispense Refill  . albuterol (PROVENTIL HFA) 108 (90 BASE) MCG/ACT inhaler Inhale 2 puffs into the lungs every 6 (six) hours as needed.  3 Inhaler  0  . aspirin 81 MG tablet Take 81 mg by mouth daily.        [name] azelastine (ASTELIN) 137 MCG/SPRAY nasal spray Place 1 spray into the nose 2 (two) times daily. Use in each nostril as directed      . beclomethasone (QVAR) 80 MCG/ACT inhaler Inhale 2 puffs into the lungs 2 (two) times daily.  3 Inhaler  2  . cetirizine (ZYRTEC) 10 MG tablet Take 10 mg by mouth daily as needed.       . conjugated estrogens (PREMARIN) vaginal cream Place vaginally once a week.       . cyanocobalamin (,VITAMIN B-12,) 1000 MCG/ML injection Inject 1,000 mcg into the muscle every 30 (thirty) days.        . cycloSPORINE (RESTASIS) 0.05 % ophthalmic emulsion Place 1 drop into both eyes 2 (two) times daily.      . diazepam (VALIUM) 5 MG tablet Take 5 mg by mouth every 6 (six) hours as needed (vertigo).       Mariane Baumgarten Calcium (STOOL SOFTENER PO) Take 1 tablet by mouth  as needed.      . ergocalciferol (VITAMIN D2) 50000 UNITS capsule Take 50,000 Units by mouth once a week.      . esomeprazole (NEXIUM) 40 MG capsule Take 40 mg by mouth daily.        [name] estradiol (VIVELLE-DOT) 0.075 MG/24HR Place 1 patch onto the skin once a week.       . fluticasone (FLONASE) 50 MCG/ACT nasal spray Place 2 sprays into both nostrils 2 (two) times daily.      . Levothyroxine Sodium 50 MCG CAPS Take 50 mcg by mouth daily.        . meclizine (ANTIVERT) 25 MG tablet Take 25 mg by mouth 3 (three) times daily as needed.      . metoprolol (TOPROL-XL) 50 MG 24 hr tablet Take 50 mg by mouth daily.       . montelukast (SINGULAIR) 10 MG tablet Take 10 mg by mouth at bedtime.        . Multiple Vitamins-Minerals (CENTRUM SILVER) tablet Take 1 tablet by mouth daily.        . Needles & Syringes (1CC TB SYRINGE) MISC  by Does not apply route as directed.      . nystatin (MYCOSTATIN) 100000 UNIT/ML suspension Take 5 mLs (500,000 Units total) by mouth 4 (four) times daily.  240 mL  0  . omega-3 acid ethyl esters (LOVAZA) 1 G capsule Take 1-2 g by mouth 2 (two) times daily.       [name] Propylene Glycol (SYSTANE BALANCE) 0.6 % SOLN Apply 1 drop to eye as needed.      . psyllium (METAMUCIL) 58.6 % powder Take 1 packet by mouth as needed.      . simvastatin (ZOCOR) 40 MG tablet Take 40 mg by mouth daily.      [name] triamcinolone lotion (KENALOG) 0.1 % Apply 1 application topically daily as needed.      . urea (CARMOL) 10 % cream Apply topically as needed.  90 g  0   No current facility-administered medications for this visit.    Allergies  Allergen Reactions  . Codeine   . Sulfonamide Derivatives     Family History  Problem Relation Age of Onset  . Cancer Mother 86    lung, passive smoke  . Cancer Father 73    lung, smoker  . Cancer Paternal Aunt     breast, lung  . Cancer Cousin     breast  . Stroke Sister     TIA  . Hypertension Sister   . Heart failure Sister   . Alzheimer's disease  Paternal Aunt   . Aortic dissection Paternal Uncle   . Aortic dissection Maternal Aunt     History   Social History  . Marital Status: Married    Spouse Name: N/A    Number of Children: N/A  . Years of Education: N/A   Occupational History  . Not on file.   Social History Main Topics  . Smoking status: Former Smoker -- 0.30 packs/day for 7 years    Types: Cigarettes    Quit date: 10/27/1973  . 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 Hx of children who are grown  . Alcohol Use: Yes     Comment: very rare  . Drug Use: No  . Sexual Activity: Not on file   Other Topics Concern  . Not on file   Social History Narrative   Lives with husband, cat   Grown children   Occ: retired, was Gaffer   Edu: trade degree then 2 yrscollege     Constitutional: Denies fever, malaise, fatigue, headache or abrupt weight changes.  Skin: Pt reports cat bite to left ankle. Denies redness, rashes, lesions or ulcercations.    No other specific complaints in a complete review of systems (except as listed in HPI above).  Objective:   Physical Exam  BP 124/78  Pulse 88  Temp(Src) 98.1 F (36.7 C) (Oral)  Wt 192 lb 12 oz (87.431 kg)  SpO2 98% Wt Readings from Last 3 Encounters:  12/21/13 192 lb 12 oz (87.431 kg)  12/19/13 194 lb 4 oz (88.111 kg)  09/14/13 196 lb 3.2 oz (88.996 kg)    General: Appears her stated age, well developed, well nourished in NAD. Skin: Warm, dry and intact. 3 small punctures noted on the posterior ankle. Not draining. No redness or warmth. Cardiovascular: Normal rate and rhythm. S1,S2 noted.  No murmur, rubs or gallops noted. No JVD or BLE edema. No carotid bruits noted. Pulmonary/Chest: Normal effort and positive vesicular breath sounds. No respiratory  distress. No wheezes, rales or ronchi noted.         Assessment & Plan:   Cat bite of left ankle:  Wash with warm water and soap eRx  for prophylactic Augmentin BID x 10 days Watch for swelling, streaking, redness, warmth or purulent drainage  RTC as needed or if symptoms worsen

## 2013-12-21 NOTE — Progress Notes (Signed)
Pre visit review using our clinic review tool, if applicable. No additional management support is needed unless otherwise documented below in the visit note. 

## 2014-01-04 LAB — HM MAMMOGRAPHY

## 2014-01-06 ENCOUNTER — Encounter: Payer: Self-pay | Admitting: Family Medicine

## 2014-01-07 ENCOUNTER — Emergency Department: Payer: Self-pay | Admitting: Emergency Medicine

## 2014-01-08 ENCOUNTER — Other Ambulatory Visit: Payer: Self-pay | Admitting: Family Medicine

## 2014-01-08 ENCOUNTER — Encounter: Payer: Self-pay | Admitting: Family Medicine

## 2014-01-12 ENCOUNTER — Encounter: Payer: Self-pay | Admitting: Family Medicine

## 2014-01-12 ENCOUNTER — Ambulatory Visit (INDEPENDENT_AMBULATORY_CARE_PROVIDER_SITE_OTHER): Admitting: Family Medicine

## 2014-01-12 VITALS — BP 124/82 | HR 84 | Temp 98.0°F | Wt 193.8 lb

## 2014-01-12 DIAGNOSIS — IMO0002 Reserved for concepts with insufficient information to code with codable children: Secondary | ICD-10-CM

## 2014-01-12 DIAGNOSIS — R21 Rash and other nonspecific skin eruption: Secondary | ICD-10-CM

## 2014-01-12 MED ORDER — TRIAMCINOLONE ACETONIDE 0.1 % EX CREA: 1.0000 "application " | TOPICAL_CREAM | Freq: Two times a day (BID) | CUTANEOUS | Status: DC

## 2014-01-12 NOTE — Assessment & Plan Note (Signed)
Erythematous papular rash consistent with bug bites - ?chiggers Not consistent with shingles as nonvesicular. Treat with TCI cream, update if worsening or spreading.
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
## 2014-01-12 NOTE — Progress Notes (Signed)
Pre visit review using our clinic review tool, if applicable. No additional management support is needed unless otherwise documented below in the visit note. 
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
## 2014-01-14 ENCOUNTER — Encounter: Payer: Self-pay | Admitting: Family Medicine

## 2014-02-13 ENCOUNTER — Encounter: Payer: Self-pay | Admitting: *Deleted

## 2014-02-20 ENCOUNTER — Ambulatory Visit (INDEPENDENT_AMBULATORY_CARE_PROVIDER_SITE_OTHER): Admitting: Family Medicine

## 2014-02-20 ENCOUNTER — Encounter: Payer: Self-pay | Admitting: Family Medicine

## 2014-02-20 VITALS — BP 138/82 | HR 68 | Temp 98.2°F | Wt 185.5 lb

## 2014-02-20 DIAGNOSIS — I059 Rheumatic mitral valve disease, unspecified: Secondary | ICD-10-CM

## 2014-02-20 DIAGNOSIS — E039 Hypothyroidism, unspecified: Secondary | ICD-10-CM

## 2014-02-20 DIAGNOSIS — R42 Dizziness and giddiness: Secondary | ICD-10-CM

## 2014-02-20 DIAGNOSIS — B370 Candidal stomatitis: Secondary | ICD-10-CM

## 2014-02-20 DIAGNOSIS — I10 Essential (primary) hypertension: Secondary | ICD-10-CM

## 2014-02-20 DIAGNOSIS — I341 Nonrheumatic mitral (valve) prolapse: Secondary | ICD-10-CM

## 2014-02-20 DIAGNOSIS — E8941 Symptomatic postprocedural ovarian failure: Secondary | ICD-10-CM

## 2014-02-20 DIAGNOSIS — E8940 Asymptomatic postprocedural ovarian failure: Secondary | ICD-10-CM

## 2014-02-20 MED ORDER — MECLIZINE HCL 25 MG PO TABS: 25.0000 mg | ORAL_TABLET | Freq: Three times a day (TID) | ORAL | Status: DC | PRN

## 2014-02-20 MED ORDER — ESOMEPRAZOLE MAGNESIUM 40 MG PO CPDR: 40.0000 mg | DELAYED_RELEASE_CAPSULE | Freq: Every day | ORAL | Status: DC

## 2014-02-20 MED ORDER — ERGOCALCIFEROL 1.25 MG (50000 UT) PO CAPS: 50000.0000 [IU] | ORAL_CAPSULE | ORAL | Status: DC

## 2014-02-20 MED ORDER — DIAZEPAM 5 MG PO TABS: 5.0000 mg | ORAL_TABLET | Freq: Four times a day (QID) | ORAL | Status: DC | PRN

## 2014-02-20 MED ORDER — ALBUTEROL SULFATE HFA 108 (90 BASE) MCG/ACT IN AERS: 2.0000 | INHALATION_SPRAY | Freq: Four times a day (QID) | RESPIRATORY_TRACT | Status: DC | PRN

## 2014-02-20 MED ORDER — AZELASTINE HCL 0.1 % NA SOLN: 1.0000 | Freq: Two times a day (BID) | NASAL | Status: DC

## 2014-02-20 MED ORDER — BECLOMETHASONE DIPROPIONATE 80 MCG/ACT IN AERS: 2.0000 | INHALATION_SPRAY | Freq: Two times a day (BID) | RESPIRATORY_TRACT | Status: DC

## 2014-02-20 MED ORDER — ESTRADIOL 0.05 MG/24HR TD PTTW: 1.0000 | MEDICATED_PATCH | TRANSDERMAL | Status: DC

## 2014-02-20 MED ORDER — CYANOCOBALAMIN 1000 MCG/ML IJ SOLN: 1000.0000 ug | INTRAMUSCULAR | Status: DC

## 2014-02-20 MED ORDER — NYSTATIN 100000 UNIT/ML MT SUSP: 5.0000 mL | Freq: Four times a day (QID) | OROMUCOSAL | Status: DC

## 2014-02-20 MED ORDER — FLUTICASONE PROPIONATE 50 MCG/ACT NA SUSP: 2.0000 | Freq: Two times a day (BID) | NASAL | Status: DC

## 2014-02-20 MED ORDER — CYCLOSPORINE 0.05 % OP EMUL: 1.0000 [drp] | Freq: Two times a day (BID) | OPHTHALMIC | Status: DC

## 2014-02-20 MED ORDER — TRIAMCINOLONE ACETONIDE 0.1 % EX LOTN: 1.0000 "application " | TOPICAL_LOTION | Freq: Every day | CUTANEOUS | Status: DC | PRN

## 2014-02-20 MED ORDER — SIMVASTATIN 40 MG PO TABS: 40.0000 mg | ORAL_TABLET | Freq: Every day | ORAL | Status: DC

## 2014-02-20 MED ORDER — TB SYRINGE 1 ML MISC: Status: DC

## 2014-02-20 MED ORDER — ESTROGENS, CONJUGATED 0.625 MG/GM VA CREA: TOPICAL_CREAM | VAGINAL | Status: DC

## 2014-02-20 MED ORDER — METOPROLOL SUCCINATE ER 50 MG PO TB24: 50.0000 mg | ORAL_TABLET | Freq: Every day | ORAL | Status: DC

## 2014-02-20 MED ORDER — OMEGA-3-ACID ETHYL ESTERS 1 G PO CAPS: 1.0000 g | ORAL_CAPSULE | Freq: Two times a day (BID) | ORAL | Status: DC

## 2014-02-20 MED ORDER — LEVOTHYROXINE SODIUM 50 MCG PO CAPS: 50.0000 ug | ORAL_CAPSULE | Freq: Every day | ORAL | Status: DC

## 2014-02-20 MED ORDER — MONTELUKAST SODIUM 10 MG PO TABS: 10.0000 mg | ORAL_TABLET | Freq: Every day | ORAL | Status: DC

## 2014-02-20 NOTE — Assessment & Plan Note (Signed)
Lab Results  Component Value Date   TSH 2.147 04/12/2013  continue levothyroxine.

## 2014-02-20 NOTE — Assessment & Plan Note (Signed)
Continue taper off estrogen patch.

## 2014-02-20 NOTE — Assessment & Plan Note (Addendum)
Controlled on valium and meclizine. Suggested she call local ENT office to inquire about current studies on tinnitus.

## 2014-02-20 NOTE — Progress Notes (Signed)
Pre visit review using our clinic review tool, if applicable. No additional management support is needed unless otherwise documented below in the visit note. 

## 2014-02-20 NOTE — Patient Instructions (Signed)
Decrease vivelle dot to 0.05 patch once a week You don't need antibiotic prevention prior to dental procedures. Let's continue to watch mitral valve prolapse. Good to see you today, call us with questions. Sign release for records of echocardiogram with [name] at [place] or at [facility]

## 2014-02-20 NOTE — Assessment & Plan Note (Signed)
Chronic, stable. Continue meds. 

## 2014-02-20 NOTE — Assessment & Plan Note (Signed)
I don't think she needs ppx abx and discussed this.  Reviewed 2007 guidelines with her.
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
## 2014-02-20 NOTE — Assessment & Plan Note (Signed)
Endorses intermittent white patches with hoarseness.  No evident thrush on exam. Refilled swish/swallow.

## 2014-02-21 ENCOUNTER — Telehealth: Payer: Self-pay | Admitting: Family Medicine

## 2014-02-21 NOTE — Telephone Encounter (Signed)
Relevant patient education assigned to patient using Emmi. ° °

## 2014-02-23 ENCOUNTER — Encounter: Payer: Self-pay | Admitting: Family Medicine

## 2014-02-26 ENCOUNTER — Encounter: Payer: Self-pay | Admitting: Family Medicine

## 2014-02-27 ENCOUNTER — Telehealth: Payer: Self-pay | Admitting: *Deleted

## 2014-02-27 NOTE — Telephone Encounter (Signed)
Filled and placed in kim's box. 

## 2014-02-27 NOTE — Telephone Encounter (Signed)
Confirmation form for flonase doing in your IN box for review. Please return to me. Thanks.

## 2014-02-28 NOTE — Telephone Encounter (Signed)
Form faxed

## 2014-03-14 ENCOUNTER — Encounter: Payer: Self-pay | Admitting: Pulmonary Disease

## 2014-03-14 ENCOUNTER — Ambulatory Visit (INDEPENDENT_AMBULATORY_CARE_PROVIDER_SITE_OTHER): Admitting: Pulmonary Disease

## 2014-03-14 VITALS — BP 128/80 | HR 74 | Temp 98.4°F | Ht 65.5 in | Wt 182.6 lb

## 2014-03-14 DIAGNOSIS — G4733 Obstructive sleep apnea (adult) (pediatric): Secondary | ICD-10-CM

## 2014-03-14 DIAGNOSIS — J45909 Unspecified asthma, uncomplicated: Secondary | ICD-10-CM

## 2014-03-14 DIAGNOSIS — Z9989 Dependence on other enabling machines and devices: Secondary | ICD-10-CM

## 2014-03-14 NOTE — Progress Notes (Signed)
   Subjective:    Patient ID: Angela Mendoza, female    DOB: 01/06/1951, 63 y.o.   MRN: [id number]  HPI The patient comes in today for followup of her known asthma and obstructive sleep apnea. She has done very well with Qvar, but quickly saw her symptoms can worsen if she does not stay on the medicine twice a day as directed. She currently is doing well from an asthma standpoint, with no significant cough or mucus production. She is staying on CPAP compliantly, and is doing very well with her device. Her download shows excellent compliance and good control of her sleep apnea. She is having mask fitting issues, and is not getting a lot of help from her home care company.   Review of Systems  Constitutional: Negative for fever and unexpected weight change.  HENT: Negative for congestion, dental problem, ear pain, nosebleeds, postnasal drip, rhinorrhea, sinus pressure, sneezing, sore throat and trouble swallowing.   Eyes: Negative for redness and itching.  Respiratory: Negative for cough, chest tightness, shortness of breath and wheezing.   Cardiovascular: Negative for palpitations and leg swelling.  Gastrointestinal: Negative for nausea and vomiting.  Genitourinary: Negative for dysuria.  Musculoskeletal: Negative for joint swelling.  Skin: Negative for rash.  Neurological: Negative for headaches.  Hematological: Does not bruise/bleed easily.  Psychiatric/Behavioral: Negative for dysphoric mood. The patient is not nervous/anxious.        Objective:   Physical Exam Overweight female in no acute distress Nose without purulence or discharge noted Neck without lymphadenopathy or thyromegaly No skin breakdown or pressure necrosis from the CPAP mask Chest totally clear to auscultation, no wheezing Cardiac exam with regular rate and rhythm Lower extremities without edema, no cyanosis Alert and oriented, moves all 4 extremities.       Assessment & Plan:

## 2014-03-14 NOTE — Patient Instructions (Signed)
Continue on qvar consistently  Will make apptm at sleep center for you to have a mask fitting. Will send order to [facility] for new hose. Keep working on weight loss.  You are doing great. followup with me again in 41mos.

## 2014-03-14 NOTE — Assessment & Plan Note (Signed)
The patient is doing extremely well with CPAP by her download, although she is having fitting issues with her mask. She is satisfied with her sleep and daytime alertness. Will refer her to the sleep Center for a formal mask fitting, and I've encouraged her to continue working on weight loss.

## 2014-03-14 NOTE — Assessment & Plan Note (Signed)
The patient has done very well with Qvar, but clearly sees a decline in her breathing if she does not take the medication on a consistent basis. I've asked her to stay on this twice a day, and to stay as active as possible.

## 2014-03-22 ENCOUNTER — Ambulatory Visit (HOSPITAL_BASED_OUTPATIENT_CLINIC_OR_DEPARTMENT_OTHER): Attending: Pulmonary Disease | Admitting: Radiology

## 2014-03-22 DIAGNOSIS — G4733 Obstructive sleep apnea (adult) (pediatric): Secondary | ICD-10-CM

## 2014-03-22 DIAGNOSIS — Z9989 Dependence on other enabling machines and devices: Secondary | ICD-10-CM

## 2014-06-15 ENCOUNTER — Encounter: Payer: Self-pay | Admitting: Internal Medicine

## 2014-06-15 ENCOUNTER — Ambulatory Visit (INDEPENDENT_AMBULATORY_CARE_PROVIDER_SITE_OTHER): Admitting: Internal Medicine

## 2014-06-15 VITALS — BP 124/86 | HR 84 | Temp 97.7°F | Wt 179.5 lb

## 2014-06-15 DIAGNOSIS — R300 Dysuria: Secondary | ICD-10-CM

## 2014-06-15 DIAGNOSIS — N3090 Cystitis, unspecified without hematuria: Secondary | ICD-10-CM

## 2014-06-15 LAB — POCT URINALYSIS DIPSTICK
Bilirubin, UA: NEGATIVE
Glucose, UA: NEGATIVE
Ketones, UA: NEGATIVE
Nitrite, UA: NEGATIVE
Protein, UA: NEGATIVE
Spec Grav, UA: 1.02
Urobilinogen, UA: 0.2
pH, UA: 6

## 2014-06-15 MED ORDER — NITROFURANTOIN MACROCRYSTAL 100 MG PO CAPS: 100.0000 mg | ORAL_CAPSULE | Freq: Four times a day (QID) | ORAL | Status: DC

## 2014-06-15 NOTE — Assessment & Plan Note (Signed)
Fairly classic symptoms Will give macrodantin--may only need 3 days Probably needs to restart the once in a while premarin cream--only uses it twice a month (this may be part of the problem)

## 2014-06-15 NOTE — Progress Notes (Signed)
Subjective:    Patient ID: Angela Mendoza, female    DOB: 1951/10/18, 63 y.o.   MRN: [id number]  HPI Has been having urinary urgency and burning dysuria Started 2-3 days ago Has some ongoing burning sensation No hematuria  Atrophic vaginitis in the past Hasn't been using the estrogen cream in the past month This does help her dyspareunia also  Has felt warm-but does get some hot flashes Feels drained No back pain  Current Outpatient Prescriptions on File Prior to Visit  Medication Sig Dispense Refill  . albuterol (PROVENTIL HFA) 108 (90 BASE) MCG/ACT inhaler Inhale 2 puffs into the lungs every 6 (six) hours as needed.  3 Inhaler  6  . aspirin 81 MG tablet Take 81 mg by mouth daily.        [name] azelastine (ASTELIN) 137 MCG/SPRAY nasal spray Place 1 spray into both nostrils 2 (two) times daily. Use in each nostril as directed  30 mL  11  . beclomethasone (QVAR) 80 MCG/ACT inhaler Inhale 2 puffs into the lungs 2 (two) times daily.  3 Inhaler  3  . cetirizine (ZYRTEC) 10 MG tablet Take 10 mg by mouth daily as needed.       . conjugated estrogens (PREMARIN) vaginal cream Place vaginally once a week.  42.5 g  3  . cyanocobalamin (,VITAMIN B-12,) 1000 MCG/ML injection Inject 1 mL (1,000 mcg total) into the muscle every 30 (thirty) days.  1 mL  11  . cycloSPORINE (RESTASIS) 0.05 % ophthalmic emulsion Place 1 drop into both eyes 2 (two) times daily.  0.4 mL  3  . diazepam (VALIUM) 5 MG tablet Take 1 tablet (5 mg total) by mouth every 6 (six) hours as needed (vertigo).  30 tablet  0  . Docusate Calcium (STOOL SOFTENER PO) Take 1 tablet by mouth as needed.      . ergocalciferol (VITAMIN D2) 50000 UNITS capsule Take 1 capsule (50,000 Units total) by mouth once a week.  8 capsule  2  . esomeprazole (NEXIUM) 40 MG capsule Take 1 capsule (40 mg total) by mouth daily.  90 capsule  3  . estradiol (VIVELLE-DOT) 0.05 MG/24HR patch Place 1 patch (0.05 mg total) onto the skin once a week.  9 patch  3  .  fluticasone (FLONASE) 50 MCG/ACT nasal spray Place 2 sprays into both nostrils 2 (two) times daily.  16 g  6  . Levothyroxine Sodium 50 MCG CAPS Take 1 capsule (50 mcg total) by mouth daily.  90 capsule  3  . meclizine (ANTIVERT) 25 MG tablet Take 1 tablet (25 mg total) by mouth 3 (three) times daily as needed.  30 tablet  3  . metoprolol succinate (TOPROL-XL) 50 MG 24 hr tablet Take 1 tablet (50 mg total) by mouth daily.  90 tablet  3  . montelukast (SINGULAIR) 10 MG tablet Take 1 tablet (10 mg total) by mouth at bedtime.  90 tablet  3  . Multiple Vitamins-Minerals (CENTRUM SILVER) tablet Take 1 tablet by mouth daily.        . Needles & Syringes (1CC TB SYRINGE) MISC Use as directed  100 each  0  . nystatin (MYCOSTATIN) 100000 UNIT/ML suspension Take 5 mLs (500,000 Units total) by mouth 4 (four) times daily.  200 mL  0  . omega-3 acid ethyl esters (LOVAZA) 1 G capsule Take 1-2 capsules (1-2 g total) by mouth 2 (two) times daily.  180 capsule  3  . Propylene Glycol (SYSTANE BALANCE) 0.6 %  SOLN Apply 1 drop to eye as needed.      . psyllium (METAMUCIL) 58.6 % powder Take 1 packet by mouth as needed.      . simvastatin (ZOCOR) 40 MG tablet Take 1 tablet (40 mg total) by mouth daily.  90 tablet  3  . triamcinolone lotion (KENALOG) 0.1 % Apply 1 application topically daily as needed.  60 mL  3  . urea (CARMOL) 10 % cream Apply topically as needed.  90 g  0   No current facility-administered medications on file prior to visit.    Allergies  Allergen Reactions  . Codeine   . Sulfonamide Derivatives     Past Medical History  Diagnosis Date  . HYPERLIPIDEMIA 06/09/2008  . HYPERTENSION 06/09/2008  . ABNORMAL HEART RHYTHMS 06/09/2008  . Chronic headache 06/09/2008  . Allergic rhinitis 06/09/2008    chronic  . Asthma, intrinsic 10/06/2011    controlled on qvar. [name] 09/2011:  Very mild airflow obstruction (FEV1% 68, FVL c/w obstruction)   . Depression   . GERD (gastroesophageal reflux disease)     . Hypothyroidism   . OSA on CPAP     [name]  . Dry eyes   . Surgical menopause 1991  . Vertigo   . Hot flashes   . Diverticulosis 2013    h/o diverticulitis  . History of breast implant removal     silicone mastitis - R axilla silicone LN, free silicone L breast upper outer quadrant  . COPD (chronic obstructive pulmonary disease)   . MVP (mitral valve prolapse)   . Rosacea   . Vitamin D deficiency   . Pernicious anemia     per prior pcp    Past Surgical History  Procedure Laterality Date  . Cholecystectomy  2007    biliary dyskinesia  . Appendectomy  2007  . Tubal ligation    . Total abdominal hysterectomy  1991    heavy bleeding, ovaries removed  . Nasal sinus surgery    . Breast enhancement surgery  2006  . Cardiovascular stress test  2013    [place] [name] - WNL, EF 55-60%, with 0 calcium score  . Colonoscopy  08/2012    diverticulosis  . Esophagogastroduodenoscopy  08/2012    esophageal reflux  . US echocardiography  09/2009    impaired relaxation, mild tric regurg, normal MV, EF 60%  . US echocardiography  11/2007    mild-mod MR  . Sleep study  08/2007    OSA, 12 cm H2O,  . Rectocele repair  12/2008    [name]  . Colonoscopy  2006    inflamed hyperplastic polyp    Family History  Problem Relation Age of Onset  . Cancer Mother 15    lung, passive smoke  . Cancer Father 49    lung, smoker  . Cancer Paternal Aunt     breast, lung  . Cancer Cousin     breast  . Stroke Sister     TIA  . Hypertension Sister   . Heart failure Sister   . Alzheimer's disease Paternal Aunt   . Aortic dissection Paternal Uncle   . Aortic dissection Maternal Aunt     History   Social History  . Marital Status: Married    Spouse Name: N/A    Number of Children: N/A  . Years of Education: N/A   Occupational History  . Not on file.   Social History Main Topics  . Smoking status: Former Smoker --  0.30 packs/day for 7 years    Types: Cigarettes    Quit date:  10/27/1973  . 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 Hx of children who are grown  . Alcohol Use: Yes     Comment: very rare  . Drug Use: No  . Sexual Activity: Not on file   Other Topics Concern  . Not on file   Social History Narrative   Lives with husband, cat   Grown children   Occ: retired, was Gaffer   Edu: trade degree then 2 yrscollege   Review of Systems Some nausea Appetite is off Has some upcoming major dental work (multiple crowns needed). Some soreness in her gums     Objective:   Physical Exam  Constitutional: She appears well-developed and well-nourished. No distress.  Abdominal: Soft. She exhibits no distension. There is no tenderness. There is no rebound and no guarding.  Musculoskeletal:  No CVA tenderness          Assessment & Plan:

## 2014-06-15 NOTE — Patient Instructions (Signed)
If your urinary symptoms go away with the first couple of doses, you only need to take the antibiotic for 3 days.

## 2014-06-15 NOTE — Progress Notes (Signed)
Pre visit review using our clinic review tool, if applicable. No additional management support is needed unless otherwise documented below in the visit note. 

## 2014-07-19 ENCOUNTER — Ambulatory Visit: Admitting: Internal Medicine

## 2014-08-03 ENCOUNTER — Ambulatory Visit (INDEPENDENT_AMBULATORY_CARE_PROVIDER_SITE_OTHER)

## 2014-08-03 DIAGNOSIS — Z23 Encounter for immunization: Secondary | ICD-10-CM

## 2014-08-17 ENCOUNTER — Ambulatory Visit (INDEPENDENT_AMBULATORY_CARE_PROVIDER_SITE_OTHER): Admitting: Internal Medicine

## 2014-08-17 ENCOUNTER — Encounter: Payer: Self-pay | Admitting: Internal Medicine

## 2014-08-17 VITALS — BP 138/80 | HR 64 | Temp 97.7°F

## 2014-08-17 DIAGNOSIS — Z658 Other specified problems related to psychosocial circumstances: Secondary | ICD-10-CM | POA: Diagnosis not present

## 2014-08-17 DIAGNOSIS — N951 Menopausal and female climacteric states: Secondary | ICD-10-CM | POA: Diagnosis not present

## 2014-08-17 DIAGNOSIS — R002 Palpitations: Secondary | ICD-10-CM | POA: Diagnosis not present

## 2014-08-17 DIAGNOSIS — I10 Essential (primary) hypertension: Secondary | ICD-10-CM | POA: Diagnosis not present

## 2014-08-17 DIAGNOSIS — E785 Hyperlipidemia, unspecified: Secondary | ICD-10-CM

## 2014-08-17 DIAGNOSIS — F439 Reaction to severe stress, unspecified: Secondary | ICD-10-CM

## 2014-08-17 DIAGNOSIS — E038 Other specified hypothyroidism: Secondary | ICD-10-CM

## 2014-08-17 MED ORDER — ESTRADIOL 0.075 MG/24HR TD PTTW: 1.0000 | MEDICATED_PATCH | TRANSDERMAL | Status: DC

## 2014-08-17 MED ORDER — FLUTICASONE PROPIONATE 50 MCG/ACT NA SUSP: 2.0000 | Freq: Two times a day (BID) | NASAL | Status: DC

## 2014-08-17 MED ORDER — AZELASTINE HCL 0.1 % NA SOLN: 1.0000 | Freq: Two times a day (BID) | NASAL | Status: DC

## 2014-08-17 MED ORDER — BECLOMETHASONE DIPROPIONATE 80 MCG/ACT IN AERS: 2.0000 | INHALATION_SPRAY | Freq: Two times a day (BID) | RESPIRATORY_TRACT | Status: DC

## 2014-08-17 MED ORDER — NYSTATIN 100000 UNIT/ML MT SUSP: 5.0000 mL | OROMUCOSAL | Status: DC | PRN

## 2014-08-17 MED ORDER — TRIAMCINOLONE ACETONIDE 0.1 % EX LOTN: 1.0000 "application " | TOPICAL_LOTION | Freq: Every day | CUTANEOUS | Status: DC | PRN

## 2014-08-17 MED ORDER — CYANOCOBALAMIN 1000 MCG/ML IJ SOLN: 1000.0000 ug | INTRAMUSCULAR | Status: DC

## 2014-08-17 MED ORDER — DIAZEPAM 5 MG PO TABS: 5.0000 mg | ORAL_TABLET | Freq: Four times a day (QID) | ORAL | Status: DC | PRN

## 2014-08-17 MED ORDER — TB SYRINGE 1 ML MISC: Status: DC

## 2014-08-17 MED ORDER — CYCLOSPORINE 0.05 % OP EMUL: 1.0000 [drp] | Freq: Two times a day (BID) | OPHTHALMIC | Status: DC

## 2014-08-17 MED ORDER — OMEGA-3-ACID ETHYL ESTERS 1 G PO CAPS: 1.0000 g | ORAL_CAPSULE | Freq: Two times a day (BID) | ORAL | Status: DC

## 2014-08-17 MED ORDER — ESOMEPRAZOLE MAGNESIUM 40 MG PO CPDR: 40.0000 mg | DELAYED_RELEASE_CAPSULE | Freq: Every day | ORAL | Status: DC

## 2014-08-17 MED ORDER — LEVOTHYROXINE SODIUM 50 MCG PO CAPS: 50.0000 ug | ORAL_CAPSULE | Freq: Every day | ORAL | Status: DC

## 2014-08-17 MED ORDER — SIMVASTATIN 40 MG PO TABS: 40.0000 mg | ORAL_TABLET | Freq: Every day | ORAL | Status: DC

## 2014-08-17 MED ORDER — METOPROLOL SUCCINATE ER 50 MG PO TB24: 50.0000 mg | ORAL_TABLET | Freq: Every day | ORAL | Status: DC

## 2014-08-17 MED ORDER — MONTELUKAST SODIUM 10 MG PO TABS: 10.0000 mg | ORAL_TABLET | Freq: Every day | ORAL | Status: DC

## 2014-08-17 MED ORDER — ERGOCALCIFEROL 1.25 MG (50000 UT) PO CAPS: 50000.0000 [IU] | ORAL_CAPSULE | ORAL | Status: DC

## 2014-08-17 MED ORDER — ALBUTEROL SULFATE HFA 108 (90 BASE) MCG/ACT IN AERS: 2.0000 | INHALATION_SPRAY | Freq: Four times a day (QID) | RESPIRATORY_TRACT | Status: DC | PRN

## 2014-08-17 MED ORDER — MECLIZINE HCL 25 MG PO TABS: 25.0000 mg | ORAL_TABLET | Freq: Three times a day (TID) | ORAL | Status: DC | PRN

## 2014-08-17 NOTE — Assessment & Plan Note (Signed)
Ongoing symptoms and now much worse on lower estrogen dose Will go back to higher patch Stop the estrogen cream

## 2014-08-17 NOTE — Progress Notes (Signed)
Subjective:    Patient ID: Angela Mendoza, female    DOB: 04/02/1951, 63 y.o.   MRN: [id number]  HPI Wants to switch from Dr Darnell Level to me She spoke to me about this in August (but I don't remember)  Ongoing mouth issues Relates to steroid inhaler Better since she has the nystatin liquid  She is concerned about never hearing back about her cardiology records and whether she needed a cardiologist Still get palpitations "like my heart is beating out of my chest"--- gets flushing and hot flashes These are very common May be short or long lasting At rest --- afraid to exercise due to these symptoms No problems with running up and down her steps  Has recent stress Husband has early Alzheimers--affects their life and communication Daughter with newborn baby coming to visit for Christmas --from [place] (here for 2 weeks)  Has diazepam for sudden vertigo attacks Uses it rarely  Sees Dr Gwenette Greet for her asthma Rarely needs the albuterol  Current Outpatient Prescriptions on File Prior to Visit  Medication Sig Dispense Refill  . albuterol (PROVENTIL HFA) 108 (90 BASE) MCG/ACT inhaler Inhale 2 puffs into the lungs every 6 (six) hours as needed.  3 Inhaler  6  . aspirin 81 MG tablet Take 81 mg by mouth daily.        [name] azelastine (ASTELIN) 137 MCG/SPRAY nasal spray Place 1 spray into both nostrils 2 (two) times daily. Use in each nostril as directed  30 mL  11  . beclomethasone (QVAR) 80 MCG/ACT inhaler Inhale 2 puffs into the lungs 2 (two) times daily.  3 Inhaler  3  . cetirizine (ZYRTEC) 10 MG tablet Take 10 mg by mouth daily as needed.       . conjugated estrogens (PREMARIN) vaginal cream Place vaginally once a week.  42.5 g  3  . cyanocobalamin (,VITAMIN B-12,) 1000 MCG/ML injection Inject 1 mL (1,000 mcg total) into the muscle every 30 (thirty) days.  1 mL  11  . cycloSPORINE (RESTASIS) 0.05 % ophthalmic emulsion Place 1 drop into both eyes 2 (two) times daily.  0.4 mL  3  . diazepam (VALIUM)  5 MG tablet Take 1 tablet (5 mg total) by mouth every 6 (six) hours as needed (vertigo).  30 tablet  0  . Docusate Calcium (STOOL SOFTENER PO) Take 1 tablet by mouth as needed.      . ergocalciferol (VITAMIN D2) 50000 UNITS capsule Take 1 capsule (50,000 Units total) by mouth once a week.  8 capsule  2  . esomeprazole (NEXIUM) 40 MG capsule Take 1 capsule (40 mg total) by mouth daily.  90 capsule  3  . estradiol (VIVELLE-DOT) 0.05 MG/24HR patch Place 1 patch (0.05 mg total) onto the skin once a week.  9 patch  3  . fluticasone (FLONASE) 50 MCG/ACT nasal spray Place 2 sprays into both nostrils 2 (two) times daily.  16 g  6  . Levothyroxine Sodium 50 MCG CAPS Take 1 capsule (50 mcg total) by mouth daily.  90 capsule  3  . meclizine (ANTIVERT) 25 MG tablet Take 1 tablet (25 mg total) by mouth 3 (three) times daily as needed.  30 tablet  3  . metoprolol succinate (TOPROL-XL) 50 MG 24 hr tablet Take 1 tablet (50 mg total) by mouth daily.  90 tablet  3  . montelukast (SINGULAIR) 10 MG tablet Take 1 tablet (10 mg total) by mouth at bedtime.  90 tablet  3  .  Multiple Vitamins-Minerals (CENTRUM SILVER) tablet Take 1 tablet by mouth daily.        . Needles & Syringes (1CC TB SYRINGE) MISC Use as directed  100 each  0  . nystatin (MYCOSTATIN) 100000 UNIT/ML suspension Take 5 mLs (500,000 Units total) by mouth 4 (four) times daily.  200 mL  0  . omega-3 acid ethyl esters (LOVAZA) 1 G capsule Take 1-2 capsules (1-2 g total) by mouth 2 (two) times daily.  180 capsule  3  . Propylene Glycol (SYSTANE BALANCE) 0.6 % SOLN Apply 1 drop to eye as needed.      . psyllium (METAMUCIL) 58.6 % powder Take 1 packet by mouth as needed.      . simvastatin (ZOCOR) 40 MG tablet Take 1 tablet (40 mg total) by mouth daily.  90 tablet  3  . triamcinolone lotion (KENALOG) 0.1 % Apply 1 application topically daily as needed.  60 mL  3  . urea (CARMOL) 10 % cream Apply topically as needed.  90 g  0   No current  facility-administered medications on file prior to visit.    Allergies  Allergen Reactions  . Codeine   . Sulfonamide Derivatives     Past Medical History  Diagnosis Date  . HYPERLIPIDEMIA 06/09/2008  . HYPERTENSION 06/09/2008  . ABNORMAL HEART RHYTHMS 06/09/2008  . Chronic headache 06/09/2008  . Allergic rhinitis 06/09/2008    chronic  . Asthma, intrinsic 10/06/2011    controlled on qvar. [name] 09/2011:  Very mild airflow obstruction (FEV1% 68, FVL c/w obstruction)   . Depression   . GERD (gastroesophageal reflux disease)   . Hypothyroidism   . OSA on CPAP     [name]  . Dry eyes   . Surgical menopause 1991  . Vertigo   . Hot flashes   . Diverticulosis 2013    h/o diverticulitis  . History of breast implant removal     silicone mastitis - R axilla silicone LN, free silicone L breast upper outer quadrant  . COPD (chronic obstructive pulmonary disease)   . MVP (mitral valve prolapse)   . Rosacea   . Vitamin D deficiency   . Pernicious anemia     per prior pcp    Past Surgical History  Procedure Laterality Date  . Cholecystectomy  2007    biliary dyskinesia  . Appendectomy  2007  . Tubal ligation    . Total abdominal hysterectomy  1991    heavy bleeding, ovaries removed  . Nasal sinus surgery    . Breast enhancement surgery  2006  . Cardiovascular stress test  2013    [place] [name] - WNL, EF 55-60%, with 0 calcium score  . Colonoscopy  08/2012    diverticulosis  . Esophagogastroduodenoscopy  08/2012    esophageal reflux  . US echocardiography  09/2009    impaired relaxation, mild tric regurg, normal MV, EF 60%  . US echocardiography  11/2007    mild-mod MR  . Sleep study  08/2007    OSA, 12 cm H2O,  . Rectocele repair  12/2008    [name]  . Colonoscopy  2006    inflamed hyperplastic polyp    Family History  Problem Relation Age of Onset  . Cancer Mother 49    lung, passive smoke  . Cancer Father 64    lung, smoker  . Cancer Paternal Aunt      breast, lung  . Cancer Cousin     breast  .  Stroke Sister     TIA  . Hypertension Sister   . Heart failure Sister   . Alzheimer's disease Paternal Aunt   . Aortic dissection Paternal Uncle   . Aortic dissection Maternal Aunt     History   Social History  . Marital Status: Married    Spouse Name: N/A    Number of Children: N/A  . Years of Education: N/A   Occupational History  . Not on file.   Social History Main Topics  . Smoking status: Former Smoker -- 0.30 packs/day for 7 years    Types: Cigarettes    Quit date: 10/27/1973  . 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 Hx of children who are grown  . Alcohol Use: Yes     Comment: very rare  . Drug Use: No  . Sexual Activity: Not on file   Other Topics Concern  . 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 No distress.  Neck: Normal range of motion. Neck supple. No thyromegaly present.  Cardiovascular: Normal rate, regular rhythm and normal heart sounds.  Exam reveals no gallop.   No murmur heard. Pulmonary/Chest: Effort normal and breath sounds normal. No respiratory distress. She has no wheezes. She has no rales.  Abdominal: Soft. There is no tenderness.  Musculoskeletal: She exhibits no edema and no tenderness.  Lymphadenopathy:    She has no cervical adenopathy.  Psychiatric:  Anxious Upset speaking about husband          Assessment & Plan:

## 2014-08-17 NOTE — Assessment & Plan Note (Signed)
She seems to feel more comfortable with primary prevention

## 2014-08-17 NOTE — Assessment & Plan Note (Signed)
Reviewed echo from 2010 and cardiology notes No MVP Likely related to stress and also hormonal

## 2014-08-17 NOTE — Assessment & Plan Note (Signed)
BP Readings from Last 3 Encounters:  08/17/14 138/80  06/15/14 124/86  03/14/14 128/80   Control is fine She doesn't need to keep checking it

## 2014-08-17 NOTE — Addendum Note (Signed)
Addended by: Despina Hidden on: 08/17/2014 03:53 PM   Modules accepted: Orders, Medications

## 2014-08-17 NOTE — Assessment & Plan Note (Signed)
Severe with husband with early onset Alzheimers Advised her to get counseling about this ---Lutricia Horsfall

## 2014-08-17 NOTE — Addendum Note (Signed)
Addended by: Despina Hidden on: 08/17/2014 04:06 PM   Modules accepted: Orders

## 2014-08-17 NOTE — Patient Instructions (Signed)
Please call Hospice to get an assessment from Lutricia Horsfall-- their dementia care specialist. [phone].

## 2014-08-17 NOTE — Assessment & Plan Note (Signed)
Due for labs

## 2014-08-18 LAB — CBC WITH DIFFERENTIAL/PLATELET
Basophils Absolute: 0 10*3/uL (ref 0.0–0.1)
Basophils Relative: 0.3 % (ref 0.0–3.0)
Eosinophils Absolute: 0.1 10*3/uL (ref 0.0–0.7)
Eosinophils Relative: 0.5 % (ref 0.0–5.0)
HCT: 42.5 % (ref 36.0–46.0)
Hemoglobin: 13.8 g/dL (ref 12.0–15.0)
Lymphocytes Relative: 26 % (ref 12.0–46.0)
Lymphs Abs: 2.8 10*3/uL (ref 0.7–4.0)
MCHC: 32.5 g/dL (ref 30.0–36.0)
MCV: 93.6 fl (ref 78.0–100.0)
Monocytes Absolute: 0.8 10*3/uL (ref 0.1–1.0)
Monocytes Relative: 7 % (ref 3.0–12.0)
Neutro Abs: 7.2 10*3/uL (ref 1.4–7.7)
Neutrophils Relative %: 66.2 % (ref 43.0–77.0)
Platelets: 298 10*3/uL (ref 150.0–400.0)
RBC: 4.54 Mil/uL (ref 3.87–5.11)
RDW: 13.1 % (ref 11.5–15.5)
WBC: 10.9 10*3/uL — ABNORMAL HIGH (ref 4.0–10.5)

## 2014-08-18 LAB — TSH: TSH: 1.35 u[IU]/mL (ref 0.35–4.50)

## 2014-08-18 LAB — COMPREHENSIVE METABOLIC PANEL
ALT: 27 U/L (ref 0–35)
AST: 25 U/L (ref 0–37)
Albumin: 3.9 g/dL (ref 3.5–5.2)
Alkaline Phosphatase: 65 U/L (ref 39–117)
BUN: 14 mg/dL (ref 6–23)
CO2: 28 mEq/L (ref 19–32)
Calcium: 9.8 mg/dL (ref 8.4–10.5)
Chloride: 102 mEq/L (ref 96–112)
Creatinine, Ser: 0.9 mg/dL (ref 0.4–1.2)
GFR: 70.66 mL/min (ref >60.00)
Glucose, Bld: 92 mg/dL (ref 70–99)
Potassium: 4.4 mEq/L (ref 3.5–5.1)
Sodium: 138 mEq/L (ref 135–145)
Total Bilirubin: 1.2 mg/dL (ref 0.2–1.2)
Total Protein: 8.2 g/dL (ref 6.0–8.3)

## 2014-08-18 LAB — LIPID PANEL
Cholesterol: 193 mg/dL (ref 0–200)
HDL: 63.4 mg/dL (ref >39.00)
LDL Cholesterol: 97 mg/dL (ref 0–99)
NonHDL: 129.6
Total CHOL/HDL Ratio: 3
Triglycerides: 164 mg/dL — ABNORMAL HIGH (ref 0.0–149.0)
VLDL: 32.8 mg/dL (ref 0.0–40.0)

## 2014-08-18 LAB — T4, FREE: Free T4: 0.94 ng/dL (ref 0.60–1.60)

## 2014-08-18 MED ORDER — ESOMEPRAZOLE MAGNESIUM 40 MG PO CPDR: 40.0000 mg | DELAYED_RELEASE_CAPSULE | Freq: Every day | ORAL | Status: DC

## 2014-08-18 MED ORDER — NYSTATIN 100000 UNIT/ML MT SUSP: 5.0000 mL | OROMUCOSAL | Status: DC | PRN

## 2014-08-18 MED ORDER — TRIAMCINOLONE ACETONIDE 0.1 % EX LOTN: 1.0000 "application " | TOPICAL_LOTION | Freq: Every day | CUTANEOUS | Status: DC | PRN

## 2014-08-18 NOTE — Addendum Note (Signed)
Addended by: Despina Hidden on: 08/18/2014 08:10 AM   Modules accepted: Orders

## 2014-08-21 ENCOUNTER — Encounter: Payer: Self-pay | Admitting: Internal Medicine

## 2014-08-29 ENCOUNTER — Other Ambulatory Visit: Payer: Self-pay | Admitting: *Deleted

## 2014-08-29 MED ORDER — FLUTICASONE PROPIONATE 50 MCG/ACT NA SUSP: 2.0000 | Freq: Every day | NASAL | Status: DC

## 2014-08-29 MED ORDER — NYSTATIN 100000 UNIT/ML MT SUSP: 5.0000 mL | Freq: Four times a day (QID) | OROMUCOSAL | Status: DC

## 2014-08-29 NOTE — Telephone Encounter (Signed)
See faxed form from [facility] about medication changes Form sent for scanning

## 2014-09-13 ENCOUNTER — Emergency Department: Payer: Self-pay | Admitting: Emergency Medicine

## 2014-09-13 ENCOUNTER — Encounter: Payer: Self-pay | Admitting: Internal Medicine

## 2014-09-13 ENCOUNTER — Telehealth: Payer: Self-pay

## 2014-09-13 NOTE — Telephone Encounter (Signed)
Pt left v/m; first time pt tried the generic vivelle dot patch and pt had to go to ED due to rash and hives, pt had to have IV while at ED; pt request new rx for namebrand vivelle dot 0.075 mg to meds by mail [facility]. Pt request cb. Today pt is better, was given prednisone and rash and hives are better.

## 2014-09-14 ENCOUNTER — Encounter: Payer: Self-pay | Admitting: Pulmonary Disease

## 2014-09-14 ENCOUNTER — Ambulatory Visit (INDEPENDENT_AMBULATORY_CARE_PROVIDER_SITE_OTHER): Admitting: Pulmonary Disease

## 2014-09-14 VITALS — BP 142/78 | HR 95 | Temp 97.0°F | Ht 65.5 in | Wt 184.0 lb

## 2014-09-14 DIAGNOSIS — J4520 Mild intermittent asthma, uncomplicated: Secondary | ICD-10-CM | POA: Diagnosis not present

## 2014-09-14 DIAGNOSIS — G4733 Obstructive sleep apnea (adult) (pediatric): Secondary | ICD-10-CM | POA: Diagnosis not present

## 2014-09-14 DIAGNOSIS — Z9989 Dependence on other enabling machines and devices: Secondary | ICD-10-CM

## 2014-09-14 MED ORDER — EPINEPHRINE 0.3 MG/0.3ML IJ SOAJ: 0.3000 mg | Freq: Once | INTRAMUSCULAR | Status: DC

## 2014-09-14 MED ORDER — ESTRADIOL 0.075 MG/24HR TD PTTW: 1.0000 | MEDICATED_PATCH | TRANSDERMAL | Status: DC

## 2014-09-14 NOTE — Progress Notes (Signed)
   Subjective:    Patient ID: Angela Mendoza, female    DOB: [birth year], 63 y.o.   MRN: [id number]  HPI The patient comes in today for follow-up of her known asthma and also obstructive sleep apnea. She is doing much better with C Pap since being fitted for a comfortable mask, and her download today shows excellent compliance with good control of her AHI. She is also doing well from an asthma standpoint, and is now staying compliant on her Qvar. She has not had an acute exacerbation since the last visit, and rarely uses her rescue inhaler.   Review of Systems  Constitutional: Negative for fever and unexpected weight change.  HENT: Negative for congestion, dental problem, ear pain, nosebleeds, postnasal drip, rhinorrhea, sinus pressure, sneezing, sore throat and trouble swallowing.   Eyes: Negative for redness and itching.  Respiratory: Positive for shortness of breath. Negative for cough, chest tightness and wheezing.   Cardiovascular: Negative for palpitations and leg swelling.  Gastrointestinal: Negative for nausea and vomiting.  Genitourinary: Negative for dysuria.  Musculoskeletal: Negative for joint swelling.  Skin: Negative for rash.  Neurological: Negative for headaches.  Hematological: Does not bruise/bleed easily.  Psychiatric/Behavioral: Negative for dysphoric mood. The patient is not nervous/anxious.        Objective:   Physical Exam Well-developed female in no acute distress Nose without purulence or discharge noted Neck without lymphadenopathy or thyromegaly No skin breakdown or pressure necrosis from the C Pap mask Chest with clear breath sounds, no wheezing Cardiac exam with regular rate and rhythm Lower extremities without edema, no cyanosis       Assessment & Plan:

## 2014-09-14 NOTE — Addendum Note (Signed)
Addended by: Despina Hidden on: 09/14/2014 02:19 PM   Modules accepted: Orders

## 2014-09-14 NOTE — Addendum Note (Signed)
Addended by: Despina Hidden on: 09/14/2014 02:41 PM   Modules accepted: Orders

## 2014-09-14 NOTE — Telephone Encounter (Signed)
Okay to send in epipen if she really wants it Hopefully she will not have any other reactions like that  #2 x 0

## 2014-09-14 NOTE — Patient Instructions (Signed)
Stay on cpap, and keep up with mask changes and supplies. Keep working on weight reduction No change in your asthma meds. followup with me again in 35mos

## 2014-09-14 NOTE — Telephone Encounter (Signed)
Left message on machine that brand name was sent to the pharmacy

## 2014-09-14 NOTE — Assessment & Plan Note (Signed)
The patient is doing very well from an asthma standpoint as long as she stays on her Qvar. I have asked her to continue to be compliant with this, and to keep working on conditioning and weight loss.

## 2014-09-14 NOTE — Assessment & Plan Note (Signed)
The patient is doing much better with C Pap since she has been fitted with a comfortable mask. Her download shows excellent control of her AHI, and very little mask leak.

## 2014-09-14 NOTE — Telephone Encounter (Signed)
rx sent to pharmacy by e-script  

## 2014-09-14 NOTE — Telephone Encounter (Signed)
Okay to send order for brand vivelle

## 2014-09-15 ENCOUNTER — Telehealth: Payer: Self-pay | Admitting: Pulmonary Disease

## 2014-09-15 MED ORDER — EPINEPHRINE 0.3 MG/0.3ML IJ SOAJ: 0.3000 mg | Freq: Once | INTRAMUSCULAR | Status: DC

## 2014-09-15 NOTE — Addendum Note (Signed)
Addended by: Despina Hidden on: 09/15/2014 07:28 AM   Modules accepted: Orders

## 2014-09-15 NOTE — Addendum Note (Signed)
Addended by: Despina Hidden on: 09/15/2014 07:44 AM   Modules accepted: Orders

## 2014-10-24 ENCOUNTER — Telehealth: Payer: Self-pay | Admitting: Pulmonary Disease

## 2014-10-24 NOTE — Telephone Encounter (Signed)
Called and spoke to pt. Pt c/o wheezing, prod cough with yellow mucus, chest tightness and increase in SOB. Pt stated she was seen at an UC on 12/23 and was given a pred taper and completed the taper and is still not feeling well. Appt made with MW on 12/31. Pt verbalized understanding and denied any further questions or concerns at this time.

## 2014-10-26 ENCOUNTER — Encounter: Payer: Self-pay | Admitting: Internal Medicine

## 2014-10-26 ENCOUNTER — Ambulatory Visit (INDEPENDENT_AMBULATORY_CARE_PROVIDER_SITE_OTHER): Admitting: Internal Medicine

## 2014-10-26 VITALS — BP 122/80 | HR 79 | Temp 98.1°F | Ht 65.5 in | Wt 184.0 lb

## 2014-10-26 DIAGNOSIS — R058 Other specified cough: Secondary | ICD-10-CM

## 2014-10-26 DIAGNOSIS — R05 Cough: Secondary | ICD-10-CM | POA: Diagnosis not present

## 2014-10-26 MED ORDER — AZITHROMYCIN 250 MG PO TABS: ORAL_TABLET | ORAL | Status: DC

## 2014-10-26 MED ORDER — PREDNISONE 10 MG PO TABS: ORAL_TABLET | ORAL | Status: DC

## 2014-10-26 NOTE — Progress Notes (Signed)
Subjective:     Patient ID: Angela Mendoza, female   DOB: 02/05/1951, 63 y.o.   MRN: [id number]  HPI  1 yowf quit smoking around 1975 with dx of asthma with baseline qvar/singulair rare need for saba x with ex only then ill abruptly 10/16/14 and seen as acute ov   10/26/2014 f/u ov/Sigfredo Schreier re: ? Asthma flare  Chief Complaint  Patient presents with  . Acute Visit    Pt c/o cough, chest tightness and wheezing since 10/16/14. Cough is non prod.  She is using albuterol inhaler about once per day.   rx pred/ tessalon 12/21 by UC transiently some better then worse w/in 2 d stopping prednisone assoc with nasal congestion min discolored nasal discharge/ sputum. Breathing/cough worse at hs, 75% better p saba, last took > 12 h prior to OV worse day than noct    No obvious other day to day or daytime variabilty or assoc  cp or    hb symptoms. No unusual exp hx or h/o childhood pna/ asthma or knowledge of premature birth.  Sleeping ok without nocturnal  or early am exacerbation  of respiratory  c/o's or need for noct saba. Also denies any obvious fluctuation of symptoms with weather or environmental changes or other aggravating or alleviating factors except as outlined above   Current Medications, Allergies, Complete Past Medical History, Past Surgical History, Family History, and Social History were reviewed in [employer] record.  ROS  The following are not active complaints unless bolded sore throat, dysphagia, dental problems, itching, sneezing,  nasal congestion or excess/ purulent secretions, ear ache,   fever, chills, sweats, unintended wt loss, pleuritic or exertional cp, hemoptysis,  orthopnea pnd or leg swelling, presyncope, palpitations, heartburn, abdominal pain, anorexia, nausea, vomiting, diarrhea  or change in bowel or urinary habits, change in stools or urine, dysuria,hematuria,  rash, arthralgias, visual complaints, headache, numbness weakness or ataxia or problems with  walking or coordination,  change in mood/affect or memory.           Review of Systems     Objective:   Physical Exam  amb wf nad with cough only with fvc assoc with prominent  pseudowheeze  Wt Readings from Last 3 Encounters:  10/26/14 184 lb (83.462 kg)  09/14/14 184 lb (83.462 kg)  06/15/14 179 lb 8 oz (81.421 kg)    Vital signs reviewed  HEENT: nl dentition, turbinates, and orophanx. Nl external ear canals without cough reflex   NECK :  without JVD/Nodes/TM/ nl carotid upstrokes bilaterally   LUNGS: no acc muscle use, clear to A and P bilaterally without cough on insp or exp maneuvers   CV:  RRR  no s3 or murmur or increase in P2, no edema   ABD:  soft and nontender with nl excursion in the supine position. No bruits or organomegaly, bowel sounds nl  MS:  warm without deformities, calf tenderness, cyanosis or clubbing  SKIN: warm and dry without lesions    NEURO:  alert, approp, no deficits         Assessment:

## 2014-10-26 NOTE — Patient Instructions (Addendum)
mucinex dm or delsym   zpak  If not better >> Prednisone 10 mg take  4 each am x 2 days,   2 each am x 2 days,  1 each am x 2 days and stop   Nexium 40 mg Take 30-60 min before first meal of the day but add pepcid 20 mg at bedtime as long as coughing  For drainage/ tickle  take chlortrimeton (chlorpheniramine) 4 mg every 4 hours available over the counter (may cause drowsiness)   GERD (REFLUX)  is an extremely common cause of respiratory symptoms just like yours , many times with no obvious heartburn at all.    It can be treated with medication, but also with lifestyle changes including avoidance of late meals, excessive alcohol, smoking cessation, and avoid fatty foods, chocolate, peppermint, colas, red wine, and acidic juices such as orange juice.  NO MINT OR MENTHOL PRODUCTS SO NO COUGH DROPS  USE SUGARLESS CANDY INSTEAD (Jolley ranchers or Stover's or Life Savers) or even ice chips will also do - the key is to swallow to prevent all throat clearing. NO OIL BASED VITAMINS - use powdered substitutes.    If not better return to see Dr Gwenette Greet w/in 2 weeks

## 2014-10-27 DIAGNOSIS — R058 Other specified cough: Secondary | ICD-10-CM | POA: Insufficient documentation

## 2014-10-27 DIAGNOSIS — R05 Cough: Secondary | ICD-10-CM | POA: Insufficient documentation

## 2014-10-27 NOTE — Assessment & Plan Note (Signed)
No evidence of asthma exac today but does appear to have  Classic Upper airway cough syndrome, so named because it's frequently impossible to sort out how much is  CR/sinusitis with freq throat clearing (which can be related to primary GERD)   vs  causing  secondary (" extra esophageal")  GERD from wide swings in gastric pressure that occur with throat clearing, often  promoting self use of mint and menthol lozenges that reduce the lower esophageal sphincter tone and exacerbate the problem further in a cyclical fashion.   Probably started as uri/ viral Explained the natural history of uri and why it's necessary in patients at risk to treat GERD aggressively - at least  short term -   to reduce risk of evolving cyclical cough initially  triggered by epithelial injury and a heightened sensitivty to the effects of any upper airway irritants,  most importantly acid - related - then perpetuated by epithelial injury related to the cough itself as the upper airway collapses on itself.  That is, the more sensitive the epithelium becomes once it is damaged by the virus, the more the ensuing irritability> the more the cough, the more the secondary reflux (especially in those prone to reflux) the more the irritation of the sensitive mucosa and so on in a  Classic cyclical pattern.    Will rx with zpak and max gerd rx/ cough suppression with delsym and if all else fails > Prednisone 10 mg take  4 each am x 2 days,   2 each am x 2 days,  1 each am x 2 days and stop   F/u per Dr Gwenette Greet

## 2014-11-08 ENCOUNTER — Ambulatory Visit (INDEPENDENT_AMBULATORY_CARE_PROVIDER_SITE_OTHER): Admitting: Internal Medicine

## 2014-11-08 ENCOUNTER — Encounter: Payer: Self-pay | Admitting: Internal Medicine

## 2014-11-08 DIAGNOSIS — I10 Essential (primary) hypertension: Secondary | ICD-10-CM

## 2014-11-08 DIAGNOSIS — Z8371 Family history of colonic polyps: Secondary | ICD-10-CM

## 2014-11-08 DIAGNOSIS — R928 Other abnormal and inconclusive findings on diagnostic imaging of breast: Secondary | ICD-10-CM

## 2014-11-08 DIAGNOSIS — R058 Other specified cough: Secondary | ICD-10-CM

## 2014-11-08 DIAGNOSIS — Z9049 Acquired absence of other specified parts of digestive tract: Secondary | ICD-10-CM

## 2014-11-08 DIAGNOSIS — K2100 Gastro-esophageal reflux disease with esophagitis, without bleeding: Secondary | ICD-10-CM

## 2014-11-08 DIAGNOSIS — R591 Generalized enlarged lymph nodes: Secondary | ICD-10-CM

## 2014-11-08 DIAGNOSIS — R5383 Other fatigue: Secondary | ICD-10-CM | POA: Insufficient documentation

## 2014-11-08 DIAGNOSIS — R599 Enlarged lymph nodes, unspecified: Secondary | ICD-10-CM

## 2014-11-08 DIAGNOSIS — R05 Cough: Secondary | ICD-10-CM

## 2014-11-08 DIAGNOSIS — Z23 Encounter for immunization: Secondary | ICD-10-CM

## 2014-11-08 DIAGNOSIS — Z1239 Encounter for other screening for malignant neoplasm of breast: Secondary | ICD-10-CM

## 2014-11-08 DIAGNOSIS — Z8719 Personal history of other diseases of the digestive system: Secondary | ICD-10-CM

## 2014-11-08 MED ORDER — "SYRINGE 20G X 1"" 3 ML MISC": 1.0000 | Status: DC

## 2014-11-08 MED ORDER — AEROCHAMBER MV MISC: Status: DC

## 2014-11-08 MED ORDER — ESTROGENS, CONJUGATED 0.625 MG/GM VA CREA: 1.0000 | TOPICAL_CREAM | Freq: Every day | VAGINAL | Status: DC

## 2014-11-08 MED ORDER — BUPROPION HCL ER (XL) 150 MG PO TB24: 150.0000 mg | ORAL_TABLET | Freq: Every day | ORAL | Status: DC

## 2014-11-08 NOTE — Patient Instructions (Addendum)
Please take a probiotic ( Align, Floraque or Culturelle) for 2 weeks whenever /if you start an antibiotic to prevent a serious antibiotic associated diarrhea  Called" clostridium dificile colitis" ( should also help prevent   vaginal yeast infections)   Consider adding Benadryl at bedtime for management of post nasal drip, and Sudafed PE for the ear congestion  10 mg every 6 hours  Trial of wellbutrin for your depression .  Stop if it makes your irritable.  Return in 2 months   Referral to Dr Bary Castilla [facility],  In process,  For follow up on breasts and colon

## 2014-11-08 NOTE — Progress Notes (Signed)
Pre-visit discussion using our clinic review tool. No additional management support is needed unless otherwise documented below in the visit note.  

## 2014-11-08 NOTE — Progress Notes (Signed)
Patient ID: Angela Mendoza, female   DOB: [birth year], 64 y.o.   MRN: [id number]   Patient Active Problem List   Diagnosis Date Noted  . History of diverticulitis 11/11/2014  . Abnormal mammogram of right breast 11/11/2014  . Family history of colonic polyps 11/11/2014  . S/P cholecystectomy 11/11/2014  . Caregiver with fatigue 11/08/2014  . Upper airway cough syndrome 10/27/2014  . Palpitations 08/17/2014  . Situational stress 08/17/2014  . Hyperkeratosis of sole 12/19/2013  . Depression   . GERD (gastroesophageal reflux disease)   . Hypothyroidism   . Vertigo   . Menopausal syndrome   . Dry eyes   . Asthma, intrinsic 10/06/2011  . Hyperlipemia 06/09/2008  . OSA on CPAP 06/09/2008  . Essential hypertension, benign 06/09/2008  . ALLERGIC RHINITIS 06/09/2008  . HEADACHE, CHRONIC 06/09/2008  . ANGINA, HX OF 06/09/2008    Subjective:  CC:   Chief Complaint  Patient presents with  . Establish Care    Ask about comment from Dr. Silvio Pate    HPI:   Angela Mendoza a 64 y.o. female who presents to establish care.    She is recovering from viral infection which she developed last month around the holidays.  Treated at walk in clinic Dec 21  with prednisone 50 mg daily x 5 days.   Symptoms transiently improved but returned and On dec 30th, however, when symptoms were persistent,  was seen by pulmonology for persistent cough and treated for acid reflux,  Advised to start prednisone and azithromycin  For prn worsening symptoms .  Has not taken the z pak but did take the prednisone.   History of Abnormal right mammogram.  History of silicone implants removed.  Last mammogram march 2015.  Abnormality noted in the right axillary area,   Was CT'd.  No biopsy done yet.  Due for follow up breast exam in March with surgeon in [place] , but wants to transfer surveillancece and management to a local surgeon  History of GERD with dyspepsia. .   Was told she had GI cancer after having an abd  exam and plain x ray . Referred to [facility], who treated her empirically with abx for diverticulitis followed by  Repeat imaging with CT and diagnostic colonoscopy (done Oct 2012) which suggested resolving diverticulitis.   Next colonoscopy not sure due when.  No family history of CA,  But sister had 4 precancerous polyps at age 68   Menopause:  She has been having increased vaginal dryness since stopping premarin cream and would like to resume it if appropriate.  She is using transdermal estrogen for management of hot flashes. Does not smoke, no history of breast CA but does have an abnormal mammogram as stated above, due to axillary node enlargement suspected to be silicone granulomas.     Past Medical History  Diagnosis Date  . HYPERLIPIDEMIA 06/09/2008  . HYPERTENSION 06/09/2008  . ABNORMAL HEART RHYTHMS 06/09/2008  . Chronic headache 06/09/2008  . Allergic rhinitis 06/09/2008    chronic  . Asthma, intrinsic 10/06/2011    controlled on qvar. [name] 09/2011:  Very mild airflow obstruction (FEV1% 68, FVL c/w obstruction)   . Depression   . GERD (gastroesophageal reflux disease)   . Hypothyroidism   . OSA on CPAP     [name]  . Dry eyes   . Surgical menopause 1991  . Vertigo   . Hot flashes   . Diverticulosis 2013    h/o diverticulitis  . History of breast  implant removal     silicone mastitis - R axilla silicone LN, free silicone L breast upper outer quadrant  . COPD (chronic obstructive pulmonary disease)   . MVP (mitral valve prolapse)   . Rosacea   . Vitamin D deficiency   . Pernicious anemia     per prior pcp   Allergies  Allergen Reactions  . Codeine   . Sulfonamide Derivatives   . Estradiol Rash    Generic estradiol patch d/t the adhesive    Past Surgical History  Procedure Laterality Date  . Cholecystectomy  2007    biliary dyskinesia  . Appendectomy  2007  . Tubal ligation    . Total abdominal hysterectomy  1991    heavy bleeding, ovaries removed  . Nasal sinus  surgery    . Breast enhancement surgery  2006  . Cardiovascular stress test  2013    [place] [name] - WNL, EF 55-60%, with 0 calcium score  . Colonoscopy  08/2012    diverticulosis  . Esophagogastroduodenoscopy  08/2012    esophageal reflux  . US echocardiography  09/2009    impaired relaxation, mild tric regurg, normal MV, EF 60%  . US echocardiography  11/2007    mild-mod MR  . Sleep study  08/2007    OSA, 12 cm H2O,  . Rectocele repair  12/2008    [name]  . Colonoscopy  2006    inflamed hyperplastic polyp    History   Social History  . Marital Status: Married    Spouse Name: N/A    Number of Children: N/A  . Years of Education: N/A   Occupational History  . Not on file.   Social History Main Topics  . Smoking status: Former Smoker -- 0.30 packs/day for 7 years    Types: Cigarettes    Quit date: 10/27/1973  . 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 Hx of children who are grown  . Alcohol Use: Yes     Comment: very rare  . Drug Use: No  . Sexual Activity: Not on file   Other Topics Concern  . Not on file   Social History Narrative   Lives with husband, cat   Grown children   Occ: retired, was Gaffer   Edu: trade degree then 2 yrscollege   Family History  Problem Relation Age of Onset  . Cancer Mother 109    lung, passive smoke  . Cancer Father 66    lung, smoker  . Cancer Paternal Aunt     breast, lung  . Cancer Cousin     breast  . Stroke Sister     TIA  . Hypertension Sister   . Heart failure Sister   . Alzheimer's disease Paternal Aunt   . Aortic dissection Paternal Uncle   . Aortic dissection Maternal Aunt     Review of Systems:   The rest of the review of systems was negative except those addressed in the HPI.      Objective:  There were no vitals taken for this visit.  General appearance: alert, cooperative and appears stated age Ears: normal TM's and  external ear canals both ears Throat: lips, mucosa, and tongue normal; teeth and gums normal Neck: no adenopathy, no carotid bruit, supple, symmetrical, trachea midline and thyroid not enlarged, symmetric, no tenderness/mass/nodules Back: symmetric, no curvature. ROM normal. No CVA tenderness. Lungs: clear to auscultation bilaterally Heart: regular rate  and rhythm, S1, S2 normal, no murmur, click, rub or gallop Abdomen: soft, non-tender; bowel sounds normal; no masses,  no organomegaly Pulses: 2+ and symmetric Skin: Skin color, texture, turgor normal. No rashes or lesions Lymph nodes: Cervical, supraclavicular, and axillary nodes normal.  Assessment and Plan:  Problem List Items Addressed This Visit    Abnormal mammogram of right breast    March 2015 mammogram from [facility]: report reviewed with patient.  She has bilateral scarring from removal of silicone implants,  Heterogeneously dense breasts and stable ULN right axillary nodes, comparing dating back to 2003, with prior right axillary ultrasound suggesting silicone reaaction and prior  biopsy of left axillary  node consistent with silicone granuloma.. Additional CT imaging done by [place]  Surgeon ( Dr. Percell Miller?)  without biopsy, and next mammogram due March 2016.[name]  Patient reguesting management by local surgeon. Referral to [facility] in process and 3D mammogram ordered.       Caregiver with fatigue   Essential hypertension, benign   Family history of colonic polyps    Clarification on follow up schedule for patient needed.  Will discuss with Dr. Bary Castilla.       GERD (gastroesophageal reflux disease)    Recommend continuing daily use of nexium rather than prn       History of diverticulitis    resolved with empiric treatment, confirmed by colonoscopy .       S/P cholecystectomy   Upper airway cough syndrome    Improved with maximal suppression of GERD and cough suppressants.         Other Visit Diagnoses     Enlarged lymph nodes    -  Primary    Relevant Orders    Ambulatory referral to General Surgery    Need for prophylactic vaccination against Streptococcus pneumoniae (pneumococcus)        Relevant Orders    Pneumococcal conjugate vaccine 13-valent (Completed)    Breast cancer screening        Relevant Orders    MM DIGITAL SCREENING BILATERAL      A total of 40 minutes was spent with patient more than half of which was spent in counseling patient on the above mentioned issues , reviewing and explaining  Prior labs and imaging studies done, and coordination of care.

## 2014-11-09 ENCOUNTER — Encounter: Payer: Self-pay | Admitting: *Deleted

## 2014-11-11 ENCOUNTER — Encounter: Payer: Self-pay | Admitting: Internal Medicine

## 2014-11-11 DIAGNOSIS — Z8371 Family history of colonic polyps: Secondary | ICD-10-CM | POA: Insufficient documentation

## 2014-11-11 DIAGNOSIS — R928 Other abnormal and inconclusive findings on diagnostic imaging of breast: Secondary | ICD-10-CM | POA: Insufficient documentation

## 2014-11-11 DIAGNOSIS — Z8719 Personal history of other diseases of the digestive system: Secondary | ICD-10-CM | POA: Insufficient documentation

## 2014-11-11 DIAGNOSIS — Z9049 Acquired absence of other specified parts of digestive tract: Secondary | ICD-10-CM | POA: Insufficient documentation

## 2014-11-11 NOTE — Assessment & Plan Note (Addendum)
March 2015 mammogram from [facility]: report reviewed with patient.  She has bilateral scarring from removal of silicone implants,  Heterogeneously dense breasts and stable ULN right axillary nodes, comparing dating back to 2003, with prior right axillary ultrasound suggesting silicone reaaction and prior  biopsy of left axillary  node consistent with silicone granuloma.. Additional CT imaging done by [place]  Surgeon ( Dr. Percell Miller?)  without biopsy, and next mammogram due March 2016.[name]  Patient reguesting management by local surgeon. Referral to [facility] in process and 3D mammogram ordered.

## 2014-11-11 NOTE — Assessment & Plan Note (Signed)
resolved with empiric treatment, confirmed by colonoscopy .

## 2014-11-11 NOTE — Assessment & Plan Note (Signed)
Recommend continuing daily use of nexium rather than prn

## 2014-11-11 NOTE — Assessment & Plan Note (Signed)
Clarification on follow up schedule for patient needed.  Will discuss with Dr. Bary Castilla.

## 2014-11-11 NOTE — Assessment & Plan Note (Signed)
Improved with maximal suppression of GERD and cough suppressants.

## 2014-11-15 ENCOUNTER — Other Ambulatory Visit (INDEPENDENT_AMBULATORY_CARE_PROVIDER_SITE_OTHER)

## 2014-11-15 ENCOUNTER — Encounter: Payer: Self-pay | Admitting: Internal Medicine

## 2014-11-15 ENCOUNTER — Ambulatory Visit (INDEPENDENT_AMBULATORY_CARE_PROVIDER_SITE_OTHER): Admitting: Internal Medicine

## 2014-11-15 VITALS — BP 132/86 | HR 73 | Ht 65.0 in | Wt 184.0 lb

## 2014-11-15 DIAGNOSIS — J309 Allergic rhinitis, unspecified: Secondary | ICD-10-CM

## 2014-11-15 DIAGNOSIS — L500 Allergic urticaria: Secondary | ICD-10-CM

## 2014-11-15 LAB — SEDIMENTATION RATE: Sed Rate: 37 mm/hr — ABNORMAL HIGH (ref 0–22)

## 2014-11-15 NOTE — Patient Instructions (Addendum)
Order- lab- Allergy profile, Food IgE profile, alpha-gal IgE, ANA, sed rate, Cumin IgE 81592  Instruct use of Epipen

## 2014-11-15 NOTE — Progress Notes (Signed)
11/15/14- 61 yoF former smoker FOLLOWS FOR: Pt referred to allergist by ED physician at [facility] (pt went to ED in 08/2014). Pt stated she had generalized hives and right ear was hard to hear and hard to swallow.  Has been followed here by Dr.[name] for asthma, well controlled, and obstructive sleep apnea she describes history of seasonal pollen rhinitis and asthmatic bronchitis with triggers including house dust. Here for allergy evaluation because of a single episode in November when she developed generalized urticaria. On that day she had been doing dermatologist for removal of several moles with local anesthetic. 4 hours later she had chili with cumin that had  unfamiliar "smoky" taste. She then went to [facility] and had a caramel machiatto-new for her. By 6 PM she began to notice itching and small hives on her arms. Took 2 Benadryl but rash progressed and became generalized by midnight when she went to ER. While there she began feeling some tightness in her throat. Sometime before that she had changed her estrogen patch to generic. ER treated her with prednisone and gave EpiPen. She had had hives once before questionably associated with shrimp but she has had no food restrictions Environment: House with no mold, has cat. Remote allergy skin testing and allergy vaccine while living in [place] more than 8 years ago.  Prior to Admission medications   Medication Sig Start Date End Date Taking? Authorizing Provider  albuterol (PROVENTIL HFA) 108 (90 BASE) MCG/ACT inhaler Inhale 2 puffs into the lungs every 6 (six) hours as needed. 08/17/14  Yes Venia Carbon, MD  aspirin 81 MG tablet Take 81 mg by mouth daily.     Yes Historical Provider, MD  azelastine (ASTELIN) 0.1 % nasal spray Place 1 spray into both nostrils 2 (two) times daily. Use in each nostril as directed 08/17/14  Yes Venia Carbon, MD  beclomethasone (QVAR) 80 MCG/ACT inhaler Inhale 2 puffs into the lungs 2 (two) times daily.  08/17/14  Yes Venia Carbon, MD  cetirizine (ZYRTEC) 10 MG tablet Take 10 mg by mouth daily as needed.    Yes Historical Provider, MD  conjugated estrogens (PREMARIN) vaginal cream Place 1 Applicatorful vaginally daily. 90 day supply,  Refill for one year 11/08/14  Yes Crecencio Mc, MD  cyanocobalamin (,VITAMIN B-12,) 1000 MCG/ML injection Inject 1 mL (1,000 mcg total) into the muscle every 30 (thirty) days. 08/17/14  Yes Venia Carbon, MD  cycloSPORINE (RESTASIS) 0.05 % ophthalmic emulsion Place 1 drop into both eyes 2 (two) times daily. 08/17/14  Yes Venia Carbon, MD  diazepam (VALIUM) 5 MG tablet Take 1 tablet (5 mg total) by mouth every 6 (six) hours as needed (vertigo). 08/17/14  Yes Venia Carbon, MD  Docusate Calcium (STOOL SOFTENER PO) Take 1 tablet by mouth as needed.   Yes Historical Provider, MD  EPINEPHrine 0.3 mg/0.3 mL IJ SOAJ injection Inject 0.3 mLs (0.3 mg total) into the muscle once. 09/15/14  Yes Venia Carbon, MD  ergocalciferol (VITAMIN D2) 50000 UNITS capsule Take 1 capsule (50,000 Units total) by mouth once a week. 08/17/14  Yes Venia Carbon, MD  esomeprazole (NEXIUM) 40 MG capsule Take 1 capsule (40 mg total) by mouth daily. 08/18/14  Yes Venia Carbon, MD  estradiol (VIVELLE-DOT) 0.075 MG/24HR Place 1 patch onto the skin 2 (two) times a week. 09/14/14  Yes Venia Carbon, MD  fluticasone (FLONASE) 50 MCG/ACT nasal spray Place 2 sprays into both nostrils daily. 08/29/14  Yes Venia Carbon, MD  Levothyroxine Sodium 50 MCG CAPS Take 1 capsule (50 mcg total) by mouth daily. 08/17/14  Yes Venia Carbon, MD  meclizine (ANTIVERT) 25 MG tablet Take 1 tablet (25 mg total) by mouth 3 (three) times daily as needed. 08/17/14  Yes Venia Carbon, MD  metoprolol succinate (TOPROL-XL) 50 MG 24 hr tablet Take 1 tablet (50 mg total) by mouth daily. 08/17/14  Yes Venia Carbon, MD  montelukast (SINGULAIR) 10 MG tablet Take 1 tablet (10 mg total) by mouth at  bedtime. 08/17/14  Yes Venia Carbon, MD  Multiple Vitamins-Minerals (CENTRUM SILVER) tablet Take 1 tablet by mouth daily.     Yes Historical Provider, MD  promethazine (PHENERGAN) 25 MG tablet Take 25 mg by mouth every 6 (six) hours as needed for nausea or vomiting.   Yes Historical Provider, MD  Propylene Glycol (SYSTANE BALANCE) 0.6 % SOLN Apply 1 drop to eye as needed.   Yes Historical Provider, MD  psyllium (METAMUCIL) 58.6 % powder Take 1 packet by mouth as needed.   Yes Historical Provider, MD  simvastatin (ZOCOR) 40 MG tablet Take 1 tablet (40 mg total) by mouth daily. 08/17/14  Yes Venia Carbon, MD  Syringe/Needle, Disp, (SYRINGE 3CC/20GX1") 20G X 1" 3 ML MISC 1 Syringe by Does not apply route every 30 (thirty) days. 11/08/14  Yes Crecencio Mc, MD  triamcinolone lotion (KENALOG) 0.1 % Apply 1 application topically daily as needed. 08/18/14  Yes Venia Carbon, MD  urea (CARMOL) 10 % cream Apply topically as needed. 12/19/13  Yes Ria Bush, MD  buPROPion (WELLBUTRIN XL) 150 MG 24 hr tablet Take 1 tablet (150 mg total) by mouth daily. Patient not taking: Reported on 11/15/2014 11/08/14   Crecencio Mc, MD  omega-3 acid ethyl esters (LOVAZA) 1 G capsule Take 1-2 capsules (1-2 g total) by mouth 2 (two) times daily. Patient not taking: Reported on 11/15/2014 08/17/14   Venia Carbon, MD  Spacer/Aero-Holding Josiah Lobo (AEROCHAMBER MV) inhaler Use as instructed Patient not taking: Reported on 11/15/2014 11/08/14   Crecencio Mc, MD   Past Medical History  Diagnosis Date  . HYPERLIPIDEMIA 06/09/2008  . HYPERTENSION 06/09/2008  . ABNORMAL HEART RHYTHMS 06/09/2008  . Chronic headache 06/09/2008  . Allergic rhinitis 06/09/2008    chronic  . Asthma, intrinsic 10/06/2011    controlled on qvar. [name] 09/2011:  Very mild airflow obstruction (FEV1% 68, FVL c/w obstruction)   . Depression   . GERD (gastroesophageal reflux disease)   . Hypothyroidism   . OSA on CPAP     [name]  . Dry  eyes   . Surgical menopause 1991  . Vertigo   . Hot flashes   . Diverticulosis 2013    h/o diverticulitis  . History of breast implant removal     silicone mastitis - R axilla silicone LN, free silicone L breast upper outer quadrant  . COPD (chronic obstructive pulmonary disease)   . MVP (mitral valve prolapse)   . Rosacea   . Vitamin D deficiency   . Pernicious anemia     per prior pcp   Past Surgical History  Procedure Laterality Date  . Cholecystectomy  2007    biliary dyskinesia  . Appendectomy  2007  . Tubal ligation    . Total abdominal hysterectomy  1991    heavy bleeding, ovaries removed  . Nasal sinus surgery    . Breast enhancement surgery  2006  . Cardiovascular  stress test  2013    [place] [name] - WNL, EF 55-60%, with 0 calcium score  . Colonoscopy  08/2012    diverticulosis  . Esophagogastroduodenoscopy  08/2012    esophageal reflux  . US echocardiography  09/2009    impaired relaxation, mild tric regurg, normal MV, EF 60%  . US echocardiography  11/2007    mild-mod MR  . Sleep study  08/2007    OSA, 12 cm H2O,  . Rectocele repair  12/2008    [name]  . Colonoscopy  2006    inflamed hyperplastic polyp   Family History  Problem Relation Age of Onset  . Cancer Mother 11    lung, passive smoke  . Cancer Father 22    lung, smoker  . Cancer Paternal Aunt     breast, lung  . Cancer Cousin     breast  . Stroke Sister     TIA  . Hypertension Sister   . Heart failure Sister   . Alzheimer's disease Paternal Aunt   . Aortic dissection Paternal Uncle   . Aortic dissection Maternal Aunt    History   Social History  . Marital Status: Married    Spouse Name: N/A    Number of Children: N/A  . Years of Education: N/A   Occupational History  . retired    Social History Main Topics  . Smoking status: Former Smoker -- 0.30 packs/day for 7 years    Types: Cigarettes    Quit date: 10/27/1973  . 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 Hx of children who are grown  . Alcohol Use: 0.0 oz/week    0 Not specified per week     Comment: very rare  . Drug Use: No  . Sexual Activity: Not on file   Other Topics Concern  . 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 HEENT:  + headaches, difficulty swallowing, +tooth/dental problems, sore throat,       +sneezing, itching, ear ache, +nasal congestion, post nasal drip,  CV:  No-   chest pain, orthopnea, PND, swelling in lower extremities, anasarca,                                  dizziness, palpitations Resp:+  shortness of breath with exertion or at rest.              No-   productive cough,  No non-productive cough,  No- coughing up of blood.              No-   change in color of mucus.  No- wheezing.   Skin: No-   rash or lesions. GI:  + heartburn, +indigestion, abdominal pain, nausea, vomiting, diarrhea,                 change in bowel habits, loss of appetite GU: No-   dysuria, change in color of urine, no urgency or frequency.  No- flank pain. MS: +  joint pain or swelling.  No- decreased range of motion.  No- back pain. Neuro-     nothing unusual Psych:  No- change in mood or affect. No depression or anxiety.  No memory loss.  OBJ- Physical Exam General- Alert, Oriented, Affect-appropriate, Distress- none acute Skin- rash-none, lesions- none, excoriation- none Lymphadenopathy- none Head- atraumatic            Eyes- Gross vision intact, PERRLA, conjunctivae and secretions clear            Ears- Hearing, canals-normal            Nose- Clear, no-Septal dev, mucus, polyps, erosion, perforation             Throat- Mallampati III , mucosa clear , drainage- none, tonsils- atrophic,                    + uvula  red Neck- flexible , trachea midline, no stridor , thyroid nl, carotid no bruit Chest - symmetrical excursion , unlabored           Heart/CV- RRR , no murmur , no gallop  , no rub, nl s1 s2                           - JVD- none , edema- none, stasis changes- none, varices- none           Lung- clear to P&A, wheeze- none, cough- none , dullness-none, rub- none           Chest wall-  Abd- tender-no, distended-no, bowel sounds-present, HSM- no Br/ Gen/ Rectal- Not done, not indicated Extrem- cyanosis- none, clubbing, none, atrophy- none, strength- nl Neuro- grossly intact to observation

## 2014-11-16 ENCOUNTER — Telehealth: Payer: Self-pay | Admitting: Internal Medicine

## 2014-11-16 LAB — ALLERGY FULL PROFILE
Allergen, D pternoyssinus,d7: <0.1 kU/L
Allergen,Goose feathers, e70: <0.1 kU/L
Alternaria Alternata: 2.4 kU/L — ABNORMAL HIGH
Aspergillus fumigatus, m3: <0.1 kU/L
Bahia Grass: 0.37 kU/L — ABNORMAL HIGH
Bermuda Grass: <0.1 kU/L
Box Elder IgE: <0.1 kU/L
Candida Albicans: <0.1 kU/L
Cat Dander: 0.24 kU/L — ABNORMAL HIGH
Common Ragweed: <0.1 kU/L
Curvularia lunata: 0.24 kU/L — ABNORMAL HIGH
D. farinae: <0.1 kU/L
Dog Dander: <0.1 kU/L
Elm IgE: <0.1 kU/L
Fescue: 0.27 kU/L — ABNORMAL HIGH
G005 Rye, Perennial: 0.28 kU/L — ABNORMAL HIGH
G009 Red Top: 0.26 kU/L — ABNORMAL HIGH
Goldenrod: <0.1 kU/L
Helminthosporium halodes: 0.39 kU/L — ABNORMAL HIGH
House Dust Hollister: <0.1 kU/L
Lamb's Quarters: <0.1 kU/L
Oak: <0.1 kU/L
Plantain: <0.1 kU/L
Stemphylium Botryosum: 0.87 kU/L — ABNORMAL HIGH
Sycamore Tree: <0.1 kU/L
Timothy Grass: 0.26 kU/L — ABNORMAL HIGH

## 2014-11-16 LAB — ALLERGEN FOOD PROFILE SPECIFIC IGE
Apple: <0.1 kU/L
Chicken IgE: <0.1 kU/L
Corn: <0.1 kU/L
Egg White IgE: <0.1 kU/L
Fish Cod: <0.1 kU/L
IgE (Immunoglobulin E), Serum: 30 kU/L (ref <115)
Milk IgE: <0.1 kU/L
Orange: <0.1 kU/L
Peanut IgE: <0.1 kU/L
Shrimp IgE: <0.1 kU/L
Soybean IgE: <0.1 kU/L
Tomato IgE: <0.1 kU/L
Tuna IgE: <0.1 kU/L
Wheat IgE: <0.1 kU/L

## 2014-11-16 LAB — ANA: Anti Nuclear Antibody(ANA): NEGATIVE

## 2014-11-16 NOTE — Telephone Encounter (Signed)
Patient would like to cancel surgical referral appointment until she can have a frank discussion with current surgeon after talking with family.  Patient does not want MD to be upset with her and wanted MD advise.

## 2014-11-17 ENCOUNTER — Ambulatory Visit: Admitting: Internal Medicine

## 2014-11-17 ENCOUNTER — Ambulatory Visit: Admitting: Family Medicine

## 2014-11-18 DIAGNOSIS — L500 Allergic urticaria: Secondary | ICD-10-CM | POA: Insufficient documentation

## 2014-11-18 NOTE — Assessment & Plan Note (Signed)
Pertinent factors may include menopausal status, history of asthma and seasonal allergic rhinitis. Immediate trigger might have been related to local anesthetic for removal of moles, change from [facility] name to generic estrogen patch, something about the chili which tasted odd to her and was noted to have a lot of cumin. She has changed the estrogen patch back to brand name. We are asked if this is an allergic event. Plan-educated on urticaria and taught use of EpiPen with a demonstrator here. Discussed antihistamines. Lab for allergy profiles ordered.

## 2014-11-20 LAB — ALLERGEN, CUMIN SEED
Class: 0
Cumin IgE: <0.35 kU/L (ref <0.35)

## 2014-11-20 NOTE — Telephone Encounter (Signed)
That's fine,  Just cancel the appt

## 2014-11-21 NOTE — Telephone Encounter (Signed)
Appt with Dr. Bary Castilla canceled per pt request. She states she will f/u with surgeon in [place] she has been seeing.

## 2014-11-21 NOTE — Telephone Encounter (Signed)
Left message for patient to call office.  

## 2014-11-23 LAB — ALPHA GAL IGE: Alpha Gal IgE*: <0.1 kU/L (ref <0.35)

## 2014-11-29 ENCOUNTER — Ambulatory Visit: Payer: Self-pay | Admitting: General Surgery

## 2014-12-14 ENCOUNTER — Telehealth: Payer: Self-pay | Admitting: Internal Medicine

## 2014-12-14 MED ORDER — AEROCHAMBER MV MISC: Status: DC

## 2014-12-14 NOTE — Telephone Encounter (Signed)
Called and spoke to pt. Pt requested a spacer. Pt stated her PCP was going to give one to her but didn't. Spoke to [facility]- ok for spacer. Rx printed and placed on CY's cart. Spacer and form to be signed placed up front. Pt aware to sign form when picking up spacer. Pt already aware of how to use spacer. Nothing further needed.

## 2014-12-18 ENCOUNTER — Encounter: Payer: Self-pay | Admitting: Internal Medicine

## 2014-12-25 ENCOUNTER — Ambulatory Visit (INDEPENDENT_AMBULATORY_CARE_PROVIDER_SITE_OTHER): Admitting: Internal Medicine

## 2014-12-25 VITALS — BP 112/78 | HR 77 | Ht 65.0 in | Wt 185.0 lb

## 2014-12-25 DIAGNOSIS — J309 Allergic rhinitis, unspecified: Secondary | ICD-10-CM | POA: Diagnosis not present

## 2014-12-25 DIAGNOSIS — L500 Allergic urticaria: Secondary | ICD-10-CM | POA: Diagnosis not present

## 2014-12-25 DIAGNOSIS — J3089 Other allergic rhinitis: Secondary | ICD-10-CM

## 2014-12-25 DIAGNOSIS — J302 Other seasonal allergic rhinitis: Secondary | ICD-10-CM

## 2014-12-25 MED ORDER — LORATADINE 10 MG PO TABS: 10.0000 mg | ORAL_TABLET | Freq: Every day | ORAL | Status: DC

## 2014-12-25 MED ORDER — CETIRIZINE HCL 10 MG PO TABS: 10.0000 mg | ORAL_TABLET | Freq: Every day | ORAL | Status: DC

## 2014-12-25 NOTE — Progress Notes (Signed)
11/15/14- 40 yoF former smoker FOLLOWS FOR: Pt referred to allergist by ED physician at [facility] (pt went to ED in 08/2014). Pt stated she had generalized hives and right ear was hard to hear and hard to swallow.  Has been followed here by Dr.[name] for asthma, well controlled, and obstructive sleep apnea she describes history of seasonal pollen rhinitis and asthmatic bronchitis with triggers including house dust. Here for allergy evaluation because of a single episode in November when she developed generalized urticaria. On that day she had been doing dermatologist for removal of several moles with local anesthetic. 4 hours later she had chili with cumin that had  unfamiliar "smoky" taste. She then went to [facility] and had a caramel machiatto-new for her. By 6 PM she began to notice itching and small hives on her arms. Took 2 Benadryl but rash progressed and became generalized by midnight when she went to ER. While there she began feeling some tightness in her throat. Sometime before that she had changed her estrogen patch to generic. ER treated her with prednisone and gave EpiPen. She had had hives once before questionably associated with shrimp but she has had no food restrictions Environment: House with no mold, has cat. Remote allergy skin testing and allergy vaccine while living in [place] more than 8 years ago.  12/25/14- 60 yoF former smoker FOLLOWS FOR: Pt referred to allergist by ED physician at [facility] (pt went to ED in 08/2014). Pt stated she had generalized hives and right ear was hard to hear and hard to swallow FOLLOWS FOR: pt has seen allergist and reports doing better. Labs:11/15/14-  Allergy profile Total IgE 30, elevated for cat, grass, molds; sed rate 37, negative for foods and ANA. She feels very sensitive to dust, dogs. Using flonase/ astelin, occ benadryl or zyrtec.  ROS-see HPI Constitutional:   No-   weight loss, night sweats, fevers, chills, fatigue,  lassitude. HEENT:  + headaches, difficulty swallowing, +tooth/dental problems, sore throat,       +sneezing, itching, ear ache, +nasal congestion, post nasal drip,  CV:  No-   chest pain, orthopnea, PND, swelling in lower extremities, anasarca,                                  dizziness, palpitations Resp:+  shortness of breath with exertion or at rest.              No-   productive cough,  No non-productive cough,  No- coughing up of blood.              No-   change in color of mucus.  No- wheezing.   Skin: No-   rash or lesions. GI:  + heartburn, +indigestion, abdominal pain, nausea, vomiting,  GU:  MS: +  joint pain or swelling.   Neuro-     nothing unusual Psych:  No- change in mood or affect. No depression or anxiety.  No memory loss.  OBJ- Physical Exam General- Alert, Oriented, Affect-appropriate, Distress- none acute Skin- rash-none, lesions- none, excoriation- none Lymphadenopathy- none Head- atraumatic            Eyes- Gross vision intact, PERRLA, conjunctivae and secretions clear            Ears- Hearing, canals-normal            Nose- Clear, no-Septal dev, mucus, polyps, erosion, perforation  Throat- Mallampati III , mucosa clear , drainage- none, tonsils- atrophic                Neck- flexible , trachea midline, no stridor , thyroid nl, carotid no bruit Chest - symmetrical excursion , unlabored           Heart/CV- RRR , no murmur , no gallop  , no rub, nl s1 s2                           - JVD- none , edema- none, stasis changes- none, varices- none           Lung- clear to P&A, wheeze- none, cough- none , dullness-none, rub- none           Chest wall-  Abd-  Br/ Gen/ Rectal- Not done, not indicated Extrem- cyanosis- none, clubbing, none, atrophy- none, strength- nl Neuro- grossly intact to observation

## 2014-12-25 NOTE — Patient Instructions (Addendum)
Script sent for claritin and zyrtec  Try Zyrtec at bedtime and claritin each morning, while needed  Ok to take an extra "rescue" dose of astelin nasal spray if needed  If your nasal sprays, antihistamines and singulair are not enough, it sounds as if allergy shots might be your best help for year-around allergy problems.

## 2014-12-26 ENCOUNTER — Encounter: Payer: Self-pay | Admitting: Internal Medicine

## 2014-12-26 NOTE — Assessment & Plan Note (Signed)
She describes perennial rhinitis butr has not had more urticaria. Plan- antihistamine suppression.

## 2014-12-26 NOTE — Assessment & Plan Note (Signed)
Based on Allergy profile, we discussed SLIT therapy w Grasstek, vs allergy vaccine as options.  Plan- try symptomatic management with antihistamines and nasal steroids into this Spring season. Consider furter Rx- probably allergy vaccine, if needed.

## 2015-01-08 ENCOUNTER — Encounter: Payer: Self-pay | Admitting: Internal Medicine

## 2015-01-08 ENCOUNTER — Ambulatory Visit (INDEPENDENT_AMBULATORY_CARE_PROVIDER_SITE_OTHER): Admitting: Internal Medicine

## 2015-01-08 VITALS — BP 136/80 | HR 62 | Temp 98.3°F | Resp 16 | Ht 65.0 in | Wt 186.2 lb

## 2015-01-08 DIAGNOSIS — B3781 Candidal esophagitis: Secondary | ICD-10-CM | POA: Diagnosis not present

## 2015-01-08 DIAGNOSIS — R05 Cough: Secondary | ICD-10-CM | POA: Diagnosis not present

## 2015-01-08 DIAGNOSIS — R058 Other specified cough: Secondary | ICD-10-CM

## 2015-01-08 DIAGNOSIS — F418 Other specified anxiety disorders: Secondary | ICD-10-CM

## 2015-01-08 DIAGNOSIS — I10 Essential (primary) hypertension: Secondary | ICD-10-CM

## 2015-01-08 MED ORDER — NYSTATIN 100000 UNIT/ML MT SUSP: 1.0000 mL | Freq: Four times a day (QID) | OROMUCOSAL | Status: DC

## 2015-01-08 MED ORDER — SERTRALINE HCL 50 MG PO TABS: 50.0000 mg | ORAL_TABLET | Freq: Every day | ORAL | Status: DC

## 2015-01-08 MED ORDER — TRETINOIN 0.01 % EX GEL: Freq: Every day | CUTANEOUS | Status: DC

## 2015-01-08 MED ORDER — AEROCHAMBER MV MISC: Status: DC

## 2015-01-08 MED ORDER — FEXOFENADINE HCL 180 MG PO TABS: 180.0000 mg | ORAL_TABLET | Freq: Every day | ORAL | Status: DC

## 2015-01-08 NOTE — Progress Notes (Signed)
Pre-visit discussion using our clinic review tool. No additional management support is needed unless otherwise documented below in the visit note.  

## 2015-01-08 NOTE — Patient Instructions (Addendum)
Try zyrtec  10 mg in am and benadryl at night.  If zyrtec not working,  Try allegra 180 mg in am  (otc)   Add back [employer]'s sinus rinse once or twice daily   Instead of nexium,  Try famotodine 20 mg once or twice daily  (pepcid)  Or just before spicy meals.   Trial of zoloft (sertalien) start with 1/2 tablet daily in the evening after or with dinner.  If it interferes with sleep,  take in AM    I will refill the liquid nystatin swish and swallow

## 2015-01-08 NOTE — Progress Notes (Signed)
Patient ID: Angela Mendoza, female   DOB: Aug 06, 1951, 64 y.o.   MRN: [id number]  Patient Active Problem List   Diagnosis Date Noted  . Esophageal thrush 01/09/2015  . Allergic urticaria 11/18/2014  . History of diverticulitis 11/11/2014  . Abnormal mammogram of right breast 11/11/2014  . Family history of colonic polyps 11/11/2014  . S/P cholecystectomy 11/11/2014  . Caregiver with fatigue 11/08/2014  . Upper airway cough syndrome 10/27/2014  . Palpitations 08/17/2014  . Situational stress 08/17/2014  . Hyperkeratosis of sole 12/19/2013  . Depression with anxiety   . GERD (gastroesophageal reflux disease)   . Hypothyroidism   . Vertigo   . Menopausal syndrome   . Dry eyes   . Asthma, intrinsic 10/06/2011  . Hyperlipemia 06/09/2008  . OSA on CPAP 06/09/2008  . Essential hypertension, benign 06/09/2008  . Seasonal and perennial allergic rhinitis 06/09/2008  . HEADACHE, CHRONIC 06/09/2008  . ANGINA, HX OF 06/09/2008    Subjective:  CC:   Chief Complaint  Patient presents with  . Follow-up    wellbutrin, needs spacer for inhaler.    HPI:   Angela Mendoza is a 64 y.o. female who presents for  2 month follow up on depression, allergic rhinitis, asthma, etc.  1) Depression/: Did not tolerate wellbutrin due to increased irritability .  Has a lot of chronic anxiety , symptoms started when son was born and have been persistent,  "I try to micro manage my family." Some  compulsiveness but not a checker.  No prior trial of zoloft.    2) GERD: Stopped nexium due to bad press re alzhemiers and FH of dementia  No history of barrett's esophagus,  Has had no symptoms of GERD since stopping in  2 weeks,  Wonders if there prn options,.  Symptoms are often brought on by eating spicy food   3) Asthma:  Her wheezing is controlled but she is findting that her MDI medication is  causing recurrent thrush. Her dentist prescribed nystatin but she has been expectorating, not swallowing and feels  symptoms in esophagus.   4) Allergy regimen quandary.  Wants to keep using  benadryl qhs bc it has helped with her  PND,  , but was told by dr Annamaria Boots to use clarirtin in am and zyrtec at night.   Already on singulair. Wants to avoid allergy shots  5) Derm:  Persistent Dark circles under eyes,  And facial lines. Prior surgery made her circles worse.   Wants to resume Retin A       Past Medical History  Diagnosis Date  . HYPERLIPIDEMIA 06/09/2008  . HYPERTENSION 06/09/2008  . ABNORMAL HEART RHYTHMS 06/09/2008  . Chronic headache 06/09/2008  . Allergic rhinitis 06/09/2008    chronic  . Asthma, intrinsic 10/06/2011    controlled on qvar. [name] 09/2011:  Very mild airflow obstruction (FEV1% 68, FVL c/w obstruction)   . Depression   . GERD (gastroesophageal reflux disease)   . Hypothyroidism   . OSA on CPAP     [name]  . Dry eyes   . Surgical menopause 1991  . Vertigo   . Hot flashes   . Diverticulosis 2013    h/o diverticulitis  . History of breast implant removal     silicone mastitis - R axilla silicone LN, free silicone L breast upper outer quadrant  . COPD (chronic obstructive pulmonary disease)   . MVP (mitral valve prolapse)   . Rosacea   . Vitamin D deficiency   .  Pernicious anemia     per prior pcp    Past Surgical History  Procedure Laterality Date  . Cholecystectomy  2007    biliary dyskinesia  . Appendectomy  2007  . Tubal ligation    . Total abdominal hysterectomy  1991    heavy bleeding, ovaries removed  . Nasal sinus surgery    . Breast enhancement surgery  2006  . Cardiovascular stress test  2013    [facility] [name] - WNL, EF 55-60%, with 0 calcium score  . Colonoscopy  08/2012    diverticulosis  . Esophagogastroduodenoscopy  08/2012    esophageal reflux  . US echocardiography  09/2009    impaired relaxation, mild tric regurg, normal MV, EF 60%  . US echocardiography  11/2007    mild-mod MR  . Sleep study  08/2007    OSA, 12 cm H2O,  . Rectocele  repair  12/2008    [name]  . Colonoscopy  2006    inflamed hyperplastic polyp       The following portions of the patient's history were reviewed and updated as appropriate: Allergies, current medications, and problem list.    Review of Systems:   Patient denies headache, fevers, malaise, unintentional weight loss, skin rash, eye pain, sinus congestion and sinus pain, sore throat, dysphagia,  hemoptysis , cough, dyspnea, wheezing, chest pain, palpitations, orthopnea, edema, abdominal pain, nausea, melena, diarrhea, constipation, flank pain, dysuria, hematuria, urinary  Frequency, nocturia, numbness, tingling, seizures,  Focal weakness, Loss of consciousness,  Tremor, insomnia, depression, anxiety, and suicidal ideation.     History   Social History  . Marital Status: Married    Spouse Name: N/A  . Number of Children: N/A  . Years of Education: N/A   Occupational History  . retired    Social History Main Topics  . Smoking status: Former Smoker -- 0.30 packs/day for 7 years    Types: Cigarettes    Quit date: 10/27/1973  . 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 Hx of children who are grown  . Alcohol Use: 0.0 oz/week    0 Standard drinks or equivalent per week     Comment: very rare  . Drug Use: No  . Sexual Activity: Not on file   Other Topics Concern  . Not on file   Social History Narrative   Lives with husband, cat   Grown children   Occ: retired, was Gaffer   Edu: trade degree then 2 yrscollege    Objective:  Filed Vitals:   01/08/15 1148  BP: 136/80  Pulse: 62  Temp: 98.3 F (36.8 C)  Resp: 16     General appearance: alert, cooperative and appears stated age Ears: normal TM's and external ear canals both ears Throat: lips, mucosa, and tongue normal; teeth and gums normal Neck: no adenopathy, no carotid bruit, supple, symmetrical, trachea midline and thyroid not enlarged,  symmetric, no tenderness/mass/nodules Back: symmetric, no curvature. ROM normal. No CVA tenderness. Lungs: clear to auscultation bilaterally Heart: regular rate and rhythm, S1, S2 normal, no murmur, click, rub or gallop Abdomen: soft, non-tender; bowel sounds normal; no masses,  no organomegaly Pulses: 2+ and symmetric Skin: Skin color, texture, turgor normal. No rashes or lesions Lymph nodes: Cervical, supraclavicular, and axillary nodes normal.  Assessment and Plan:  Depression with anxiety wellbutrin stopped.  Trial of zoloft ,  continue prn valium    Essential hypertension, benign Well controlled  on current regimen. Renal function stable, no changes today. Lab Results  Component Value Date   CREATININE 0.9 08/17/2014   Lab Results  Component Value Date   NA 138 08/17/2014   K 4.4 08/17/2014   CL 102 08/17/2014   CO2 28 08/17/2014      Upper airway cough syndrome Controlled with MDI,  Antihistamine and meds for GERD.  Agree with benadryl at night and zyrtec in AM    Esophageal thrush Repeat nystatin with direction to swallow suspension.    A total of 40 minutes of face to face time was spent with patient more than half of which was spent in counselling and coordination of care   Updated Medication List Outpatient Encounter Prescriptions as of 01/08/2015  Medication Sig  . albuterol (PROVENTIL HFA) 108 (90 BASE) MCG/ACT inhaler Inhale 2 puffs into the lungs every 6 (six) hours as needed.  [name] aspirin 81 MG tablet Take 81 mg by mouth daily.    [name] azelastine (ASTELIN) 0.1 % nasal spray Place 1 spray into both nostrils 2 (two) times daily. Use in each nostril as directed  . beclomethasone (QVAR) 80 MCG/ACT inhaler Inhale 2 puffs into the lungs 2 (two) times daily.  . cetirizine (ZYRTEC) 10 MG tablet Take 1 tablet (10 mg total) by mouth daily.  [name] conjugated estrogens (PREMARIN) vaginal cream Place 1 Applicatorful vaginally daily. 90 day supply,  Refill for one year  .  cyanocobalamin (,VITAMIN B-12,) 1000 MCG/ML injection Inject 1 mL (1,000 mcg total) into the muscle every 30 (thirty) days.  . cycloSPORINE (RESTASIS) 0.05 % ophthalmic emulsion Place 1 drop into both eyes 2 (two) times daily.  . diazepam (VALIUM) 5 MG tablet Take 1 tablet (5 mg total) by mouth every 6 (six) hours as needed (vertigo).  Mariane Baumgarten Calcium (STOOL SOFTENER PO) Take 1 tablet by mouth as needed.  [name] EPINEPHrine 0.3 mg/0.3 mL IJ SOAJ injection Inject 0.3 mLs (0.3 mg total) into the muscle once. (Patient taking differently: Inject 0.3 mg into the muscle as needed. )  . ergocalciferol (VITAMIN D2) 50000 UNITS capsule Take 1 capsule (50,000 Units total) by mouth once a week.  . esomeprazole (NEXIUM) 40 MG capsule Take 1 capsule (40 mg total) by mouth daily.  [name] estradiol (VIVELLE-DOT) 0.075 MG/24HR Place 1 patch onto the skin 2 (two) times a week.  . fluticasone (FLONASE) 50 MCG/ACT nasal spray Place 2 sprays into both nostrils daily. (Patient taking differently: Place 1 spray into both nostrils 2 (two) times daily. )  . Levothyroxine Sodium 50 MCG CAPS Take 1 capsule (50 mcg total) by mouth daily.  [name] loratadine (CLARITIN) 10 MG tablet Take 1 tablet (10 mg total) by mouth daily.  . meclizine (ANTIVERT) 25 MG tablet Take 1 tablet (25 mg total) by mouth 3 (three) times daily as needed.  . metoprolol succinate (TOPROL-XL) 50 MG 24 hr tablet Take 1 tablet (50 mg total) by mouth daily.  . montelukast (SINGULAIR) 10 MG tablet Take 1 tablet (10 mg total) by mouth at bedtime.  . Multiple Vitamins-Minerals (CENTRUM SILVER) tablet Take 1 tablet by mouth daily.    [name] nystatin (MYCOSTATIN) 100000 UNIT/ML suspension Use as directed 1 mL (100,000 Units total) in the mouth or throat 4 (four) times daily.  [name] omega-3 acid ethyl esters (LOVAZA) 1 G capsule Take 1-2 capsules (1-2 g total) by mouth 2 (two) times daily.  . promethazine (PHENERGAN) 25 MG tablet Take 25 mg by mouth every 6 (six) hours as  needed for  nausea or vomiting.  [name] Propylene Glycol (SYSTANE BALANCE) 0.6 % SOLN Apply 1 drop to eye as needed.  . psyllium (METAMUCIL) 58.6 % powder Take 1 packet by mouth as needed.  . simvastatin (ZOCOR) 40 MG tablet Take 1 tablet (40 mg total) by mouth daily.  [name] Spacer/Aero-Holding Chambers (AEROCHAMBER MV) inhaler Use as instructed  . Syringe/Needle, Disp, (SYRINGE 3CC/20GX1") 20G X 1" 3 ML MISC 1 Syringe by Does not apply route every 30 (thirty) days. (Patient taking differently: 1 Syringe by Does not apply route every 30 (thirty) days. For use with b-12 injection monthly)  . triamcinolone lotion (KENALOG) 0.1 % Apply 1 application topically daily as needed.  . urea (CARMOL) 10 % cream Apply topically as needed.  . [DISCONTINUED] nystatin (MYCOSTATIN) 100000 UNIT/ML suspension Take 1 mL by mouth 2 (two) times daily.  . [DISCONTINUED] Spacer/Aero-Holding Chambers (AEROCHAMBER MV) inhaler Use as instructed  . fexofenadine (ALLEGRA ALLERGY) 180 MG tablet Take 1 tablet (180 mg total) by mouth daily.  . sertraline (ZOLOFT) 50 MG tablet Take 1 tablet (50 mg total) by mouth daily.  . sertraline (ZOLOFT) 50 MG tablet Take 1 tablet (50 mg total) by mouth daily.  [name] tretinoin (RETIN-A) 0.01 % gel Apply topically at bedtime.  . [DISCONTINUED] cetirizine (ZYRTEC) 10 MG tablet Take 10 mg by mouth daily as needed.      No orders of the defined types were placed in this encounter.    No Follow-up on file.

## 2015-01-09 ENCOUNTER — Encounter: Payer: Self-pay | Admitting: Internal Medicine

## 2015-01-09 DIAGNOSIS — B3781 Candidal esophagitis: Secondary | ICD-10-CM | POA: Insufficient documentation

## 2015-01-09 NOTE — Assessment & Plan Note (Addendum)
Controlled with MDI,  Antihistamine and meds for GERD.  Agree with benadryl at night and zyrtec in AM

## 2015-01-09 NOTE — Assessment & Plan Note (Signed)
Well controlled on current regimen. Renal function stable, no changes today. Lab Results  Component Value Date   CREATININE 0.9 08/17/2014   Lab Results  Component Value Date   NA 138 08/17/2014   K 4.4 08/17/2014   CL 102 08/17/2014   CO2 28 08/17/2014

## 2015-01-09 NOTE — Assessment & Plan Note (Signed)
Repeat nystatin with direction to swallow suspension.

## 2015-01-09 NOTE — Assessment & Plan Note (Signed)
wellbutrin stopped.  Trial of zoloft ,  continue prn valium

## 2015-01-16 ENCOUNTER — Ambulatory Visit
Admission: RE | Admit: 2015-01-16 | Discharge: 2015-01-16 | Disposition: A | Discharge status: Home / Self Care | Source: Ambulatory Visit | Attending: Internal Medicine | Admitting: Internal Medicine

## 2015-01-16 DIAGNOSIS — Z1239 Encounter for other screening for malignant neoplasm of breast: Secondary | ICD-10-CM

## 2015-01-22 ENCOUNTER — Other Ambulatory Visit: Payer: Self-pay | Admitting: Vascular Surgery

## 2015-01-22 ENCOUNTER — Encounter: Payer: Self-pay | Admitting: Internal Medicine

## 2015-01-22 DIAGNOSIS — Z1231 Encounter for screening mammogram for malignant neoplasm of breast: Secondary | ICD-10-CM

## 2015-02-27 ENCOUNTER — Ambulatory Visit: Admitting: Internal Medicine

## 2015-03-05 ENCOUNTER — Encounter: Payer: Self-pay | Admitting: Internal Medicine

## 2015-03-05 ENCOUNTER — Other Ambulatory Visit: Payer: Self-pay

## 2015-03-05 MED ORDER — SIMVASTATIN 40 MG PO TABS: 40.0000 mg | ORAL_TABLET | Freq: Every day | ORAL | Status: DC

## 2015-03-05 NOTE — Telephone Encounter (Signed)
Pharmacy - [facility] on [address]  The pt is hoping to have her simvastatin and meclinzine medication refilled  30 day supply   Pt callback - [phone]  The pt also requested a letter stating she is allowed to have her epi pen on an upcoming flight.  She is unsure if the TSA will allow that medication on her carry on.

## 2015-03-05 NOTE — Telephone Encounter (Signed)
Refilled simvastatin according to protocol ok to meclizine?  Can allergic urticaria  For  Epi pen letter for TSA?

## 2015-03-06 ENCOUNTER — Telehealth: Payer: Self-pay | Admitting: Internal Medicine

## 2015-03-06 ENCOUNTER — Other Ambulatory Visit: Payer: Self-pay | Admitting: *Deleted

## 2015-03-06 DIAGNOSIS — Z7689 Persons encountering health services in other specified circumstances: Secondary | ICD-10-CM | POA: Diagnosis not present

## 2015-03-06 MED ORDER — MECLIZINE HCL 25 MG PO TABS: 25.0000 mg | ORAL_TABLET | Freq: Three times a day (TID) | ORAL | Status: DC | PRN

## 2015-03-06 NOTE — Telephone Encounter (Signed)
Yes to all.  Letter written for epi pen

## 2015-03-06 NOTE — Telephone Encounter (Signed)
The letter I gave your earlier today per request from patient  is incomplete, based on her email from today. She wants several other things mentioned as well so I  Have rewritten it and printed it   There will now be a charge of $29 for my time.

## 2015-03-07 ENCOUNTER — Other Ambulatory Visit

## 2015-03-07 NOTE — Telephone Encounter (Signed)
Notified patient letter ready for pickup and placed at front desk. Sent to billing.

## 2015-03-12 ENCOUNTER — Ambulatory Visit: Admitting: Internal Medicine

## 2015-03-15 ENCOUNTER — Ambulatory Visit: Admitting: Pulmonary Disease

## 2015-04-05 ENCOUNTER — Other Ambulatory Visit (INDEPENDENT_AMBULATORY_CARE_PROVIDER_SITE_OTHER)

## 2015-04-05 ENCOUNTER — Telehealth: Payer: Self-pay | Admitting: *Deleted

## 2015-04-05 DIAGNOSIS — R5383 Other fatigue: Secondary | ICD-10-CM

## 2015-04-05 DIAGNOSIS — E785 Hyperlipidemia, unspecified: Secondary | ICD-10-CM

## 2015-04-05 DIAGNOSIS — E559 Vitamin D deficiency, unspecified: Secondary | ICD-10-CM

## 2015-04-05 DIAGNOSIS — I10 Essential (primary) hypertension: Secondary | ICD-10-CM

## 2015-04-05 LAB — COMPREHENSIVE METABOLIC PANEL
ALT: 20 U/L (ref 0–35)
AST: 16 U/L (ref 0–37)
Albumin: 4.3 g/dL (ref 3.5–5.2)
Alkaline Phosphatase: 63 U/L (ref 39–117)
BUN: 9 mg/dL (ref 6–23)
CO2: 28 mEq/L (ref 19–32)
Calcium: 9.4 mg/dL (ref 8.4–10.5)
Chloride: 103 mEq/L (ref 96–112)
Creatinine, Ser: 0.86 mg/dL (ref 0.40–1.20)
GFR: 70.52 mL/min (ref >60.00)
Glucose, Bld: 94 mg/dL (ref 70–99)
Potassium: 4.4 mEq/L (ref 3.5–5.1)
Sodium: 138 mEq/L (ref 135–145)
Total Bilirubin: 0.7 mg/dL (ref 0.2–1.2)
Total Protein: 6.9 g/dL (ref 6.0–8.3)

## 2015-04-05 LAB — CBC WITH DIFFERENTIAL/PLATELET
Basophils Absolute: 0 10*3/uL (ref 0.0–0.1)
Basophils Relative: 0.4 % (ref 0.0–3.0)
Eosinophils Absolute: 0.1 10*3/uL (ref 0.0–0.7)
Eosinophils Relative: 1 % (ref 0.0–5.0)
HCT: 40.2 % (ref 36.0–46.0)
Hemoglobin: 13.4 g/dL (ref 12.0–15.0)
Lymphocytes Relative: 28.4 % (ref 12.0–46.0)
Lymphs Abs: 2.1 10*3/uL (ref 0.7–4.0)
MCHC: 33.3 g/dL (ref 30.0–36.0)
MCV: 91.5 fl (ref 78.0–100.0)
Monocytes Absolute: 0.5 10*3/uL (ref 0.1–1.0)
Monocytes Relative: 7.2 % (ref 3.0–12.0)
Neutro Abs: 4.8 10*3/uL (ref 1.4–7.7)
Neutrophils Relative %: 63 % (ref 43.0–77.0)
Platelets: 300 10*3/uL (ref 150.0–400.0)
RBC: 4.39 Mil/uL (ref 3.87–5.11)
RDW: 13.4 % (ref 11.5–15.5)
WBC: 7.6 10*3/uL (ref 4.0–10.5)

## 2015-04-05 LAB — LIPID PANEL
Cholesterol: 174 mg/dL (ref 0–200)
HDL: 54.1 mg/dL (ref >39.00)
LDL Cholesterol: 85 mg/dL (ref 0–99)
NonHDL: 119.9
Total CHOL/HDL Ratio: 3
Triglycerides: 177 mg/dL — ABNORMAL HIGH (ref 0.0–149.0)
VLDL: 35.4 mg/dL (ref 0.0–40.0)

## 2015-04-05 LAB — TSH: TSH: 2.41 u[IU]/mL (ref 0.35–4.50)

## 2015-04-05 LAB — LDL CHOLESTEROL, DIRECT: Direct LDL: 84 mg/dL

## 2015-04-05 LAB — VITAMIN D 25 HYDROXY (VIT D DEFICIENCY, FRACTURES): VITD: 25.37 ng/mL — ABNORMAL LOW (ref 30.00–100.00)

## 2015-04-05 NOTE — Telephone Encounter (Signed)
Labs and dx?  

## 2015-04-06 ENCOUNTER — Ambulatory Visit (INDEPENDENT_AMBULATORY_CARE_PROVIDER_SITE_OTHER): Admitting: Pulmonary Disease

## 2015-04-06 ENCOUNTER — Encounter: Payer: Self-pay | Admitting: Pulmonary Disease

## 2015-04-06 VITALS — BP 120/74 | HR 71 | Temp 97.8°F | Ht 65.5 in | Wt 188.6 lb

## 2015-04-06 DIAGNOSIS — G4733 Obstructive sleep apnea (adult) (pediatric): Secondary | ICD-10-CM | POA: Diagnosis not present

## 2015-04-06 DIAGNOSIS — J45909 Unspecified asthma, uncomplicated: Secondary | ICD-10-CM | POA: Diagnosis not present

## 2015-04-06 DIAGNOSIS — Z9989 Dependence on other enabling machines and devices: Secondary | ICD-10-CM

## 2015-04-06 MED ORDER — PREDNISONE 10 MG PO TABS: ORAL_TABLET | ORAL | Status: DC

## 2015-04-06 NOTE — Progress Notes (Signed)
   Subjective:    Patient ID: Angela Mendoza, female    DOB: November 27, 1950, 64 y.o.   MRN: [id number]  HPI Patient comes in today for follow-up of her known asthma and obstructive sleep apnea. She continues to do well with C Pap, and her download shows excellent compliance and good control of her AHI. She had also been doing very well with her asthma on Qvar alone, but had a recent issue while traveling in [place]. She is currently having a lot of severe allergy symptoms, with also some restriction to airflow.   Review of Systems  Constitutional: Negative for fever and unexpected weight change.  HENT: Positive for congestion and postnasal drip. Negative for dental problem, ear pain, nosebleeds, rhinorrhea, sinus pressure, sneezing, sore throat and trouble swallowing.   Eyes: Negative for redness and itching.  Respiratory: Positive for cough and shortness of breath. Negative for chest tightness and wheezing.   Cardiovascular: Negative for palpitations and leg swelling.  Gastrointestinal: Negative for nausea and vomiting.  Genitourinary: Negative for dysuria.  Musculoskeletal: Negative for joint swelling.  Skin: Negative for rash.  Neurological: Negative for headaches.  Hematological: Does not bruise/bleed easily.  Psychiatric/Behavioral: Negative for dysphoric mood. The patient is not nervous/anxious.        Objective:   Physical Exam Overweight female in no acute distress Nose without purulence or discharge noted No skin breakdown or pressure necrosis from the C Pap mask Neck without lymphadenopathy or thyromegaly Chest totally clear to auscultation, no wheezing Cardiac exam with regular rate and rhythm Lower extremities without edema, no cyanosis Alert and oriented, moves all 4 extremities.       Assessment & Plan:

## 2015-04-06 NOTE — Assessment & Plan Note (Signed)
The patient has been doing well from an asthma standpoint on Qvar alone, but had issues with her allergies and a mild flare while visiting family in [place]. She is better, but continues to have some symptoms above her usual baseline. We'll therefore treat her with a short course of steroids to get her back to baseline.

## 2015-04-06 NOTE — Assessment & Plan Note (Signed)
The patient continues to do very well with her C Pap device, and is having no issues with her mask fit or pressure. Her download shows excellent compliance and good control of her AHI.

## 2015-04-06 NOTE — Patient Instructions (Signed)
Will treat with prednisone 20mg  for 6 days.  Take 20mg  a day for 2 days, then 10mg  a day for 4 days, then stop Will arrange for new cpap supplies. Work on weight loss. followup with Dr. Halford Chessman in 79mos.

## 2015-04-07 ENCOUNTER — Encounter: Payer: Self-pay | Admitting: Internal Medicine

## 2015-04-10 NOTE — Telephone Encounter (Signed)
error 

## 2015-04-12 ENCOUNTER — Encounter: Payer: Self-pay | Admitting: Internal Medicine

## 2015-04-12 ENCOUNTER — Ambulatory Visit (INDEPENDENT_AMBULATORY_CARE_PROVIDER_SITE_OTHER): Admitting: Internal Medicine

## 2015-04-12 VITALS — BP 126/76 | HR 70 | Temp 98.1°F | Resp 12 | Ht 66.0 in | Wt 187.5 lb

## 2015-04-12 DIAGNOSIS — I10 Essential (primary) hypertension: Secondary | ICD-10-CM | POA: Diagnosis not present

## 2015-04-12 DIAGNOSIS — E038 Other specified hypothyroidism: Secondary | ICD-10-CM | POA: Diagnosis not present

## 2015-04-12 DIAGNOSIS — F418 Other specified anxiety disorders: Secondary | ICD-10-CM

## 2015-04-12 DIAGNOSIS — Z8371 Family history of colonic polyps: Secondary | ICD-10-CM

## 2015-04-12 DIAGNOSIS — E034 Atrophy of thyroid (acquired): Secondary | ICD-10-CM

## 2015-04-12 DIAGNOSIS — K219 Gastro-esophageal reflux disease without esophagitis: Secondary | ICD-10-CM | POA: Diagnosis not present

## 2015-04-12 DIAGNOSIS — K5900 Constipation, unspecified: Secondary | ICD-10-CM | POA: Diagnosis not present

## 2015-04-12 MED ORDER — CYANOCOBALAMIN 1000 MCG/ML IJ SOLN: 1000.0000 ug | INTRAMUSCULAR | Status: DC

## 2015-04-12 MED ORDER — FAMOTIDINE 20 MG PO TABS: 20.0000 mg | ORAL_TABLET | Freq: Two times a day (BID) | ORAL | Status: DC

## 2015-04-12 MED ORDER — UREA 10 % EX CREA: TOPICAL_CREAM | CUTANEOUS | Status: DC | PRN

## 2015-04-12 MED ORDER — "SYRINGE 20G X 1"" 3 ML MISC": 1.0000 | Status: DC

## 2015-04-12 MED ORDER — CYCLOSPORINE 0.05 % OP EMUL: 1.0000 [drp] | Freq: Two times a day (BID) | OPHTHALMIC | Status: DC

## 2015-04-12 MED ORDER — PROMETHAZINE HCL 25 MG PO TABS: 25.0000 mg | ORAL_TABLET | Freq: Four times a day (QID) | ORAL | Status: DC | PRN

## 2015-04-12 MED ORDER — DOCUSATE SODIUM 100 MG PO CAPS: 100.0000 mg | ORAL_CAPSULE | Freq: Two times a day (BID) | ORAL | Status: DC

## 2015-04-12 NOTE — Patient Instructions (Addendum)
You can increase the famotidine to 20 mg twice daily  For GER using the [employer]   Colace is docusate 100 mg up to twice daily for constipation   Can also use miralax  You can finish the ergo D once weekly with food until gone  Then take OTC #D3 1000 ti 2000 IUs daily    Skip the multivitamin , it's  worthless   Come back in 3 months if the memory issues are still bothering you

## 2015-04-12 NOTE — Progress Notes (Signed)
Subjective:  Patient ID: Angela Mendoza, female    DOB: September 24, 1951  Age: 64 y.o. MRN: [id number]  CC: The primary encounter diagnosis was Essential hypertension, benign. Diagnoses of Gastroesophageal reflux disease without esophagitis, Hypothyroidism due to acquired atrophy of thyroid, Constipation, unspecified constipation type, Depression with anxiety, and Family history of colonic polyps were also pertinent to this visit.  HPI Angela Mendoza presents for follow up on multiple issues including situational depression,  GERD, memory loss, and family history of tubular adenomas . Treated with steroids last week by Pulmonology for bronchospasm   Using pepcid for GERD given reports about PPI use. Has some symptom si nthe evening.   Sister had 7 polyps removed recently,  3 were tubular adenomas.  Patient's last scope was normal except for diverticulosis in  2013  Needs referral to Dr Bary Castilla.    Bruising , on asa   Outpatient Prescriptions Prior to Visit  Medication Sig Dispense Refill  . albuterol (PROVENTIL HFA) 108 (90 BASE) MCG/ACT inhaler Inhale 2 puffs into the lungs every 6 (six) hours as needed. 3 Inhaler 3  . aspirin 81 MG tablet Take 81 mg by mouth daily.      [name] azelastine (ASTELIN) 0.1 % nasal spray Place 1 spray into both nostrils 2 (two) times daily. Use in each nostril as directed (Patient taking differently: Place 2 sprays into both nostrils 2 (two) times daily. Use in each nostril as directed) 90 mL 3  . beclomethasone (QVAR) 80 MCG/ACT inhaler Inhale 2 puffs into the lungs 2 (two) times daily. 3 Inhaler 3  . cetirizine (ZYRTEC) 10 MG tablet Take 1 tablet (10 mg total) by mouth daily. 90 tablet 3  . conjugated estrogens (PREMARIN) vaginal cream Place 1 Applicatorful vaginally daily. 90 day supply,  Refill for one year (Patient taking differently: Place 1 Applicatorful vaginally 2 (two) times a week. 90 day supply,  Refill for one year) 90 g 3  . diazepam (VALIUM) 5 MG tablet  Take 1 tablet (5 mg total) by mouth every 6 (six) hours as needed (vertigo). 30 tablet 0  . Docusate Calcium (STOOL SOFTENER PO) Take 1 tablet by mouth as needed.    [name] EPINEPHrine 0.3 mg/0.3 mL IJ SOAJ injection Inject 0.3 mLs (0.3 mg total) into the muscle once. (Patient taking differently: Inject 0.3 mg into the muscle as needed. ) 2 Device 0  . ergocalciferol (VITAMIN D2) 50000 UNITS capsule Take 1 capsule (50,000 Units total) by mouth once a week. 12 capsule 3  . esomeprazole (NEXIUM) 40 MG capsule Take 1 capsule (40 mg total) by mouth daily. 90 capsule 3  . estradiol (VIVELLE-DOT) 0.075 MG/24HR Place 1 patch onto the skin 2 (two) times a week. 24 patch 3  . fexofenadine (ALLEGRA ALLERGY) 180 MG tablet Take 1 tablet (180 mg total) by mouth daily. 90 tablet 3  . fluticasone (FLONASE) 50 MCG/ACT nasal spray Place 2 sprays into both nostrils daily. (Patient taking differently: Place 2 sprays into both nostrils 2 (two) times daily. ) 48 g 3  . Levothyroxine Sodium 50 MCG CAPS Take 1 capsule (50 mcg total) by mouth daily. 90 capsule 3  . meclizine (ANTIVERT) 25 MG tablet Take 1 tablet (25 mg total) by mouth 3 (three) times daily as needed. 270 tablet 1  . metoprolol succinate (TOPROL-XL) 50 MG 24 hr tablet Take 1 tablet (50 mg total) by mouth daily. 90 tablet 3  . montelukast (SINGULAIR) 10 MG tablet Take 1 tablet (10 mg  total) by mouth at bedtime. 90 tablet 3  . Multiple Vitamins-Minerals (CENTRUM SILVER) tablet Take 1 tablet by mouth daily.      [name] omega-3 acid ethyl esters (LOVAZA) 1 G capsule Take 1-2 capsules (1-2 g total) by mouth 2 (two) times daily. 180 capsule 3  . Propylene Glycol (SYSTANE BALANCE) 0.6 % SOLN Apply 1 drop to eye as needed.    . psyllium (METAMUCIL) 58.6 % powder Take 1 packet by mouth as needed.    . simvastatin (ZOCOR) 40 MG tablet Take 1 tablet (40 mg total) by mouth daily. 30 tablet 0  . Spacer/Aero-Holding Chambers (AEROCHAMBER MV) inhaler Use as instructed 1 each 0  .  tretinoin (RETIN-A) 0.01 % gel Apply topically at bedtime. 45 g 0  . triamcinolone lotion (KENALOG) 0.1 % Apply 1 application topically daily as needed. 120 mL 1  . cyanocobalamin (,VITAMIN B-12,) 1000 MCG/ML injection Inject 1 mL (1,000 mcg total) into the muscle every 30 (thirty) days. 3 mL 3  . cycloSPORINE (RESTASIS) 0.05 % ophthalmic emulsion Place 1 drop into both eyes 2 (two) times daily. 3 each 3  . promethazine (PHENERGAN) 25 MG tablet Take 25 mg by mouth every 6 (six) hours as needed for nausea or vomiting.    . Syringe/Needle, Disp, (SYRINGE 3CC/20GX1") 20G X 1" 3 ML MISC 1 Syringe by Does not apply route every 30 (thirty) days. (Patient taking differently: 1 Syringe by Does not apply route every 30 (thirty) days. For use with b-12 injection monthly) 50 each 0  . urea (CARMOL) 10 % cream Apply topically as needed. 90 g 0  . predniSONE (DELTASONE) 10 MG tablet Take 20mg  a day for 2 days, then 10mg  a day for 4 days, then stop (Patient not taking: Reported on 04/12/2015) 8 tablet 0   No facility-administered medications prior to visit.    Review of Systems;  Patient denies headache, fevers, malaise, unintentional weight loss, skin rash, eye pain, sinus congestion and sinus pain, sore throat, dysphagia,  hemoptysis , cough, dyspnea, wheezing, chest pain, palpitations, orthopnea, edema, abdominal pain, nausea, melena, diarrhea, constipation, flank pain, dysuria, hematuria, urinary  Frequency, nocturia, numbness, tingling, seizures,  Focal weakness, Loss of consciousness,  Tremor, insomnia, depression, anxiety, and suicidal ideation.      Objective:  BP 126/76 mmHg  Pulse 70  Temp(Src) 98.1 F (36.7 C) (Oral)  Resp 12  Ht 5\' 6"  (1.676 m)  Wt 187 lb 8 oz (85.049 kg)  BMI 30.28 kg/m2  SpO2 97%  BP Readings from Last 3 Encounters:  04/12/15 126/76  04/06/15 120/74  01/08/15 136/80    Wt Readings from Last 3 Encounters:  04/12/15 187 lb 8 oz (85.049 kg)  04/06/15 188 lb 9.6 oz  (85.548 kg)  01/08/15 186 lb 4 oz (84.482 kg)    General appearance: alert, cooperative and appears stated age Ears: normal TM's and external ear canals both ears Throat: lips, mucosa, and tongue normal; teeth and gums normal Neck: no adenopathy, no carotid bruit, supple, symmetrical, trachea midline and thyroid not enlarged, symmetric, no tenderness/mass/nodules Back: symmetric, no curvature. ROM normal. No CVA tenderness. Lungs: clear to auscultation bilaterally Heart: regular rate and rhythm, S1, S2 normal, no murmur, click, rub or gallop Abdomen: soft, non-tender; bowel sounds normal; no masses,  no organomegaly Pulses: 2+ and symmetric Skin: Skin color, texture, turgor normal. No rashes or lesions Lymph nodes: Cervical, supraclavicular, and axillary nodes normal.  No results found for: HGBA1C  Lab Results  Component Value  Date   CREATININE 0.86 04/05/2015   CREATININE 0.9 08/17/2014   CREATININE 0.96 04/12/2013    Lab Results  Component Value Date   WBC 7.6 04/05/2015   HGB 13.4 04/05/2015   HCT 40.2 04/05/2015   PLT 300.0 04/05/2015   GLUCOSE 94 04/05/2015   CHOL 174 04/05/2015   TRIG 177.0* 04/05/2015   HDL 54.10 04/05/2015   LDLDIRECT 84.0 04/05/2015   LDLCALC 85 04/05/2015   ALT 20 04/05/2015   AST 16 04/05/2015   NA 138 04/05/2015   K 4.4 04/05/2015   CL 103 04/05/2015   CREATININE 0.86 04/05/2015   BUN 9 04/05/2015   CO2 28 04/05/2015   TSH 2.41 04/05/2015    Mm Screening Breast Tomo Bilateral  01/18/2015   CLINICAL DATA:  Screening. Postsurgical change status post explantation reidentified.  EXAM: DIGITAL SCREENING BILATERAL MAMMOGRAM WITH 3D TOMO WITH CAD  COMPARISON:  Previous exam(s).  ACR Breast Density Category c: The breast tissue is heterogeneously dense, which may obscure small masses.  FINDINGS: There are no findings suspicious for malignancy. Images were processed with CAD. Architectural distortion secondary to is explantation is again noted.  Increased density within the otherwise normal-appearing axillary lymph nodes is reidentified which may be a finding seen in patients with a history of silicone implants and is of unclear clinical significance.  IMPRESSION: No mammographic evidence of malignancy. A result letter of this screening mammogram will be mailed directly to the patient.  RECOMMENDATION: Screening mammogram in one year. (Code:SM-B-01Y)  BI-RADS CATEGORY  1: Negative.   Electronically Signed   By: Conchita Paris M.D.   On: 01/18/2015 12:22    Assessment & Plan:   Problem List Items Addressed This Visit    Essential hypertension, benign - Primary    Well controlled on current regimen. Renal function stable, no changes today.  Lab Results  Component Value Date   CREATININE 0.86 04/05/2015   Lab Results  Component Value Date   NA 138 04/05/2015   K 4.4 04/05/2015   CL 103 04/05/2015   CO2 28 04/05/2015         Depression with anxiety    wellbutrin was stopped.  She did not tolerate trial of zoloft either,  continue prn valium         GERD (gastroesophageal reflux disease)    Has stopped PPIs and using pepcid, advised to increase to 20 mg bid for breakthrough symptoms      Relevant Medications   docusate sodium (COLACE) 100 MG capsule   famotidine (PEPCID) 20 MG tablet   Hypothyroidism    Thyroid function is WNL on current dose.  No current changes needed.   Lab Results  Component Value Date   TSH 2.41 04/05/2015         Family history of colonic polyps    Her Sister recently underwent resection of 6 precancerous polyps on last colonoscopy over age 90, and she was advised to have her colonoscopy repeated at a more frequent interval.  Her last one noted diverticulosis, no polyps,  Nov 2013, [facility] (copy in chart) .  Screening guidelines would at best suggest follow up in 5 years.   Will defer to Dr Bary Castilla.       Constipation    Recommended an increase in fiber intake to 25 to 35 grams  daily.  Made several dietary suggestions of high fiber content and  recommended daily use of Miralax., metamucil, citrucel, benefiber  or fibercon.  A total of 40 minutes was spent with patient more than half of which was spent in counseling patient on the above mentioned issues , reviewing and explaining recent labs and imaging studies done, and coordination of care.   I have changed Ms. Buckle's promethazine. I am also having her start on docusate sodium and famotidine. Additionally, I am having her maintain her aspirin, CENTRUM SILVER, Propylene Glycol, psyllium, Docusate Calcium (STOOL SOFTENER PO), diazepam, albuterol, azelastine, beclomethasone, ergocalciferol, Levothyroxine Sodium, metoprolol succinate, montelukast, omega-3 acid ethyl esters, triamcinolone lotion, esomeprazole, fluticasone, estradiol, EPINEPHrine, conjugated estrogens, cetirizine, AEROCHAMBER MV, fexofenadine, tretinoin, simvastatin, meclizine, predniSONE, urea, cyanocobalamin, SYRINGE 3CC/20GX1", and cycloSPORINE.  Meds ordered this encounter  Medications  . promethazine (PHENERGAN) 25 MG tablet    Sig: Take 1 tablet (25 mg total) by mouth every 6 (six) hours as needed for nausea or vomiting.    Dispense:  30 tablet    Refill:  3  . urea (CARMOL) 10 % cream    Sig: Apply topically as needed.    Dispense:  90 g    Refill:  0  . DISCONTD: cycloSPORINE (RESTASIS) 0.05 % ophthalmic emulsion    Sig: Place 1 drop into both eyes 2 (two) times daily.    Dispense:  3 each    Refill:  3  . cyanocobalamin (,VITAMIN B-12,) 1000 MCG/ML injection    Sig: Inject 1 mL (1,000 mcg total) into the muscle every 30 (thirty) days.    Dispense:  3 mL    Refill:  3  . DISCONTD: Syringe/Needle, Disp, (SYRINGE 3CC/20GX1") 20G X 1" 3 ML MISC    Sig: 1 Syringe by Does not apply route every 30 (thirty) days. For use with b-12 injection monthly    Dispense:  50 each    Refill:  0    For B-12 Injections  . docusate sodium (COLACE) 100  MG capsule    Sig: Take 1 capsule (100 mg total) by mouth 2 (two) times daily.    Dispense:  180 capsule    Refill:  3  . famotidine (PEPCID) 20 MG tablet    Sig: Take 1 tablet (20 mg total) by mouth 2 (two) times daily.    Dispense:  180 tablet    Refill:  3  . Syringe/Needle, Disp, (SYRINGE 3CC/20GX1") 20G X 1" 3 ML MISC    Sig: 1 Syringe by Does not apply route every 30 (thirty) days. For use with b-12 injection monthly    Dispense:  50 each    Refill:  0    For B-12 Injections  . cycloSPORINE (RESTASIS) 0.05 % ophthalmic emulsion    Sig: Place 1 drop into both eyes 2 (two) times daily.    Dispense:  3 each    Refill:  3    Medications Discontinued During This Encounter  Medication Reason  . promethazine (PHENERGAN) 25 MG tablet Reorder  . urea (CARMOL) 10 % cream Reorder  . cycloSPORINE (RESTASIS) 0.05 % ophthalmic emulsion Reorder  . cyanocobalamin (,VITAMIN B-12,) 1000 MCG/ML injection Reorder  . Syringe/Needle, Disp, (SYRINGE 3CC/20GX1") 20G X 1" 3 ML MISC Reorder  . Syringe/Needle, Disp, (SYRINGE 3CC/20GX1") 20G X 1" 3 ML MISC Reorder  . cycloSPORINE (RESTASIS) 0.05 % ophthalmic emulsion Reorder    Follow-up: Return in about 3 months (around 07/13/2015).   Crecencio Mc, MD

## 2015-04-14 DIAGNOSIS — K5900 Constipation, unspecified: Secondary | ICD-10-CM | POA: Insufficient documentation

## 2015-04-14 NOTE — Assessment & Plan Note (Signed)
Thyroid function is WNL on current dose.  No current changes needed.   Lab Results  Component Value Date   TSH 2.41 04/05/2015

## 2015-04-14 NOTE — Assessment & Plan Note (Signed)
Has stopped PPIs and using pepcid, advised to increase to 20 mg bid for breakthrough symptoms

## 2015-04-14 NOTE — Assessment & Plan Note (Signed)
Recommended an increase in fiber intake to 25 to 35 grams daily.  Made several dietary suggestions of high fiber content and  recommended daily use of Miralax., metamucil, citrucel, benefiber  or fibercon.

## 2015-04-14 NOTE — Assessment & Plan Note (Signed)
Well controlled on current regimen. Renal function stable, no changes today.  Lab Results  Component Value Date   CREATININE 0.86 04/05/2015   Lab Results  Component Value Date   NA 138 04/05/2015   K 4.4 04/05/2015   CL 103 04/05/2015   CO2 28 04/05/2015

## 2015-04-14 NOTE — Assessment & Plan Note (Addendum)
Her Sister recently underwent resection of 6 precancerous polyps on last colonoscopy over age 65, and she was advised to have her colonoscopy repeated at a more frequent interval.  Her last one noted diverticulosis, no polyps,  Nov 2013, [facility] (copy in chart) .  Screening guidelines would at best suggest follow up in 5 years.   Will defer to Dr Bary Castilla.

## 2015-04-14 NOTE — Assessment & Plan Note (Signed)
wellbutrin was stopped.  She did not tolerate trial of zoloft either,  continue prn valium

## 2015-04-17 ENCOUNTER — Telehealth: Payer: Self-pay | Admitting: Pulmonary Disease

## 2015-04-17 NOTE — Telephone Encounter (Signed)
Pt returning call.Angela Mendoza ° °

## 2015-04-17 NOTE — Telephone Encounter (Signed)
Pt spoke with [facility] already and needs nothing further needed

## 2015-04-17 NOTE — Telephone Encounter (Signed)
lmtcb for pt.  

## 2015-05-14 ENCOUNTER — Ambulatory Visit (INDEPENDENT_AMBULATORY_CARE_PROVIDER_SITE_OTHER): Admitting: Family Medicine

## 2015-05-14 ENCOUNTER — Encounter: Payer: Self-pay | Admitting: Family Medicine

## 2015-05-14 VITALS — BP 130/80 | HR 72 | Temp 98.5°F | Ht 66.0 in | Wt 186.2 lb

## 2015-05-14 DIAGNOSIS — R350 Frequency of micturition: Secondary | ICD-10-CM | POA: Diagnosis not present

## 2015-05-14 DIAGNOSIS — R300 Dysuria: Secondary | ICD-10-CM

## 2015-05-14 DIAGNOSIS — N390 Urinary tract infection, site not specified: Secondary | ICD-10-CM | POA: Insufficient documentation

## 2015-05-14 DIAGNOSIS — N3001 Acute cystitis with hematuria: Secondary | ICD-10-CM | POA: Diagnosis not present

## 2015-05-14 LAB — POCT URINALYSIS DIPSTICK
Bilirubin, UA: NEGATIVE
Glucose, UA: NEGATIVE
Ketones, UA: NEGATIVE
Nitrite, UA: POSITIVE
Protein, UA: NEGATIVE
Spec Grav, UA: <=1.005
Urobilinogen, UA: 0.2
pH, UA: 6

## 2015-05-14 MED ORDER — CEPHALEXIN 500 MG PO CAPS: 500.0000 mg | ORAL_CAPSULE | Freq: Two times a day (BID) | ORAL | Status: DC

## 2015-05-14 NOTE — Progress Notes (Signed)
   Subjective:    Patient ID: Angela Mendoza, female    DOB: [birth year], 64 y.o.   MRN: [id number]  HPI 64 year old female with hypertension, HLD, depression/anxiety, and hypothyroidism presents to the clinic today with complaints dysuria.  1) Dysuria  Patient reports that she has been experiencing pain with urination and urinary frequency/urgency since Friday.  Pain is mild/moderate in severity. She reports associated suprapubic pain.  She has been take OTC Azo with some relief.  No exacerbating factors.  No associated fever, chills, back pain.  No recent antibiotic use. Patient is currently sexually active with her husband is not concerned about STD's. Reports of hematuria or vaginal discharge.  Social Hx - Former smoker as below.  History  Substance Use Topics  . Smoking status: Former Smoker -- 0.30 packs/day for 7 years    Types: Cigarettes    Quit date: 10/27/1973  . 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 Hx of children who are grown  . Alcohol Use: 0.0 oz/week    0 Standard drinks or equivalent per week     Comment: very rare   Review of Systems  Constitutional: Negative for fever.  Musculoskeletal: Negative for back pain.      Objective:   Physical Exam Filed Vitals:   05/14/15 1049  BP: 130/80  Pulse: 72  Temp: 98.5 F (36.9 C)   Vital signs reviewed.  Exam: General: well appearing, NAD. Cardiovascular: RRR. No murmurs, rubs, or gallops. Respiratory: CTAB. No rales, rhonchi, or wheeze. Abdomen: soft, tender to palpation in the suprapubic region. No rebound or guarding. No CVA tenderness. Extremities: warm, well perfused. No LE edema.     Assessment & Plan:  See Problem List

## 2015-05-14 NOTE — Assessment & Plan Note (Signed)
New Problem.  UA consistent with UTI. Sending Culture and treating with Keflex 500 mg twice a day 7 days. Patient to continue Azo and probiotic if desired. Patient to follow-up closely with her primary physician. Encouraged her to contact me if she develops fever, chills, nausea, vomiting or back pain.

## 2015-05-14 NOTE — Progress Notes (Signed)
Pre visit review using our clinic review tool, if applicable. No additional management support is needed unless otherwise documented below in the visit note. 

## 2015-05-14 NOTE — Patient Instructions (Signed)
It was nice to see you today.  Take the keflex twice a day x 7 days.  You can continue the Azo as well as a probiotic.  Follow up with Dr. Derrel Nip at your convenience.  Take care  Dr. Lacinda Axon

## 2015-05-17 LAB — URINE CULTURE: Colony Count: >=100000

## 2015-05-22 ENCOUNTER — Encounter: Payer: Self-pay | Admitting: Internal Medicine

## 2015-05-22 ENCOUNTER — Telehealth: Payer: Self-pay | Admitting: *Deleted

## 2015-05-22 NOTE — Telephone Encounter (Signed)
Patient's urine grew Proteus that was sensitive to the antibiotic that she was given. If she is having continued symptoms she should follow up with Dr. Derrel Nip.

## 2015-05-22 NOTE — Telephone Encounter (Signed)
Pt called states she was diagnosed with a bladder infection, given antibiotics.  States she feels better however she is still having symptoms.  Please advise

## 2015-05-23 ENCOUNTER — Encounter: Payer: Self-pay | Admitting: Family Medicine

## 2015-05-23 ENCOUNTER — Ambulatory Visit (INDEPENDENT_AMBULATORY_CARE_PROVIDER_SITE_OTHER): Admitting: Family Medicine

## 2015-05-23 VITALS — BP 122/82 | HR 76 | Temp 97.9°F | Ht 66.0 in | Wt 189.2 lb

## 2015-05-23 DIAGNOSIS — R39198 Other difficulties with micturition: Secondary | ICD-10-CM

## 2015-05-23 DIAGNOSIS — N3001 Acute cystitis with hematuria: Secondary | ICD-10-CM | POA: Diagnosis not present

## 2015-05-23 DIAGNOSIS — R3989 Other symptoms and signs involving the genitourinary system: Secondary | ICD-10-CM

## 2015-05-23 MED ORDER — CIPROFLOXACIN HCL 500 MG PO TABS: 500.0000 mg | ORAL_TABLET | Freq: Two times a day (BID) | ORAL | Status: DC

## 2015-05-23 MED ORDER — FLUCONAZOLE 150 MG PO TABS: 150.0000 mg | ORAL_TABLET | Freq: Once | ORAL | Status: DC

## 2015-05-23 NOTE — Assessment & Plan Note (Signed)
Only mild improvement per patient. She has persistent symptoms.  She was treated with Keflex and finish the course. Urine culture grew Proteus which was pansensitive except for macrobid. I suspect that her continued symptoms are from underlying issues with incomplete voiding and vaginal irritation and not UTI.  Sending culture to ensure resolution. While awaiting culture, will ensure eradication of UTI by treating with Cipro. Treating suspected yeast vaginitis with Diflucan. Referral to Urology.

## 2015-05-23 NOTE — Telephone Encounter (Signed)
Left message on VM to return call 

## 2015-05-23 NOTE — Telephone Encounter (Signed)
Spoke with pt, scheduled 11am f/u appoint with Dr Lacinda Axon

## 2015-05-23 NOTE — Patient Instructions (Signed)
It was nice to see you today.  Take the antibiotic as prescribed.  I will let you know the results.  I am placing a referral to Urology.  Take care  Dr. Lacinda Axon

## 2015-05-23 NOTE — Progress Notes (Signed)
   Subjective:    Patient ID: Angela Mendoza, female    DOB: July 02, 1951, 64 y.o.   MRN: [id number]  HPI 64 year old female with recent UTI presents for an acute visit with continued complaints of burning with urination, urinary urgency and frequency.  Patient reports that she continues to have burning with urination, urinary urgency, urinary frequency. She states that she was compliant with the antibiotic and has finished the course. She states that the dysuria is still quite bothersome, currently 7-8 out of 10 in severity. She's been taking Azo with some improvement.  No known exacerbating factors. No recent fevers, chills. She continues to have some suprapubic discomfort. Additionally, she's had some vaginal irritation and itching.  Social Hx - Former smoker.  Review of Systems  Constitutional: Negative for fever and chills.  Genitourinary: Positive for dysuria, urgency, frequency and vaginal discharge. Negative for flank pain.       Objective:   Physical Exam  Constitutional: She appears well-developed and well-nourished.  Cardiovascular: Normal rate and regular rhythm.   Pulmonary/Chest: Effort normal and breath sounds normal. She has no wheezes. She has no rales.  Abdominal: She exhibits no distension.  Mild tenderness in suprapubic region. Negative for CVA tenderness.       Assessment & Plan:  See Problem List

## 2015-05-27 LAB — URINE CULTURE: Colony Count: 90000

## 2015-05-28 ENCOUNTER — Telehealth: Payer: Self-pay

## 2015-05-28 NOTE — Telephone Encounter (Signed)
Pt was informed of her results and ask if she could come in after antibiotics are done for another urine culture to see if its gone.

## 2015-05-31 ENCOUNTER — Telehealth: Payer: Self-pay

## 2015-05-31 ENCOUNTER — Telehealth: Payer: Self-pay | Admitting: Internal Medicine

## 2015-05-31 NOTE — Telephone Encounter (Signed)
Is there somewhere that she wants to go? Dr. Derrel Nip is familiar with Uro-Gyn at [facility].

## 2015-05-31 NOTE — Telephone Encounter (Signed)
Pt liked the idea of seeing the uro-gyn that you know from [place]. If i could get that physicians name so that i am able to call her back tomorrow.

## 2015-05-31 NOTE — Telephone Encounter (Signed)
[place]

## 2015-05-31 NOTE — Telephone Encounter (Signed)
Please advise 

## 2015-05-31 NOTE — Telephone Encounter (Signed)
I do not know of any gynecologists in [facility], did she have a particular name in mind? Dr Lacinda Axon was the one who made the referral so I am forwarding message to him.

## 2015-05-31 NOTE — Telephone Encounter (Signed)
Pt called about not wanting to go the urologist in [facility], however she would like to be referred to a gynecologist at [place]

## 2015-06-01 ENCOUNTER — Telehealth: Payer: Self-pay

## 2015-06-01 NOTE — Telephone Encounter (Signed)
Pt was called back with the Uro-gyn doctors number. She was given the number to the facility. She did want to know what they'd do about getting her records i let her know that they may ask her to sign a medical release form or they may have epic.

## 2015-06-06 ENCOUNTER — Telehealth: Payer: Self-pay | Admitting: *Deleted

## 2015-06-06 ENCOUNTER — Other Ambulatory Visit (INDEPENDENT_AMBULATORY_CARE_PROVIDER_SITE_OTHER)

## 2015-06-06 DIAGNOSIS — R350 Frequency of micturition: Secondary | ICD-10-CM | POA: Diagnosis not present

## 2015-06-06 NOTE — Telephone Encounter (Signed)
Information given to referral coordinator, Melissa T.  for follow up.

## 2015-06-06 NOTE — Telephone Encounter (Signed)
Patient had a referral to an ambulatory urology office. Patient stated that medical records have to be faxed to this office. Patient left information  Instructions that needs to be used. Sheet is in Dr. Lupita Dawn box.

## 2015-06-06 NOTE — Telephone Encounter (Signed)
Confirmed with Melissa. Records faxed.

## 2015-06-07 ENCOUNTER — Encounter: Payer: Self-pay | Admitting: Internal Medicine

## 2015-06-08 LAB — URINE CULTURE
Colony Count: NO GROWTH
Organism ID, Bacteria: NO GROWTH

## 2015-06-20 ENCOUNTER — Telehealth: Payer: Self-pay | Admitting: Internal Medicine

## 2015-06-20 DIAGNOSIS — Z113 Encounter for screening for infections with a predominantly sexual mode of transmission: Secondary | ICD-10-CM

## 2015-06-20 NOTE — Telephone Encounter (Signed)
Pt states she is due for a Hep C. Need orders in please. Thank You!

## 2015-06-20 NOTE — Telephone Encounter (Signed)
Hep C and hiv ordered.  Per CDC guidelines

## 2015-06-26 ENCOUNTER — Other Ambulatory Visit (INDEPENDENT_AMBULATORY_CARE_PROVIDER_SITE_OTHER)

## 2015-06-26 DIAGNOSIS — Z113 Encounter for screening for infections with a predominantly sexual mode of transmission: Secondary | ICD-10-CM | POA: Diagnosis not present

## 2015-06-28 ENCOUNTER — Encounter: Payer: Self-pay | Admitting: Internal Medicine

## 2015-06-28 LAB — HIV ANTIBODY (ROUTINE TESTING W REFLEX): HIV 1&2 Ab, 4th Generation: NONREACTIVE

## 2015-06-28 LAB — HEPATITIS C ANTIBODY: HCV Ab: NEGATIVE

## 2015-07-12 ENCOUNTER — Telehealth: Payer: Self-pay | Admitting: *Deleted

## 2015-07-12 ENCOUNTER — Encounter: Payer: Self-pay | Admitting: Internal Medicine

## 2015-07-12 ENCOUNTER — Ambulatory Visit (INDEPENDENT_AMBULATORY_CARE_PROVIDER_SITE_OTHER): Admitting: Internal Medicine

## 2015-07-12 ENCOUNTER — Ambulatory Visit
Admission: RE | Admit: 2015-07-12 | Discharge: 2015-07-12 | Disposition: A | Discharge status: Home / Self Care | Source: Ambulatory Visit | Attending: Internal Medicine | Admitting: Internal Medicine

## 2015-07-12 VITALS — BP 138/78 | HR 82 | Temp 98.1°F | Resp 12 | Ht 66.0 in | Wt 189.8 lb

## 2015-07-12 DIAGNOSIS — M79605 Pain in left leg: Secondary | ICD-10-CM

## 2015-07-12 DIAGNOSIS — R42 Dizziness and giddiness: Secondary | ICD-10-CM | POA: Diagnosis not present

## 2015-07-12 DIAGNOSIS — M79602 Pain in left arm: Secondary | ICD-10-CM | POA: Diagnosis not present

## 2015-07-12 DIAGNOSIS — N951 Menopausal and female climacteric states: Secondary | ICD-10-CM | POA: Diagnosis not present

## 2015-07-12 DIAGNOSIS — Z23 Encounter for immunization: Secondary | ICD-10-CM | POA: Diagnosis not present

## 2015-07-12 DIAGNOSIS — F418 Other specified anxiety disorders: Secondary | ICD-10-CM

## 2015-07-12 MED ORDER — PREDNISONE 10 MG PO TABS: ORAL_TABLET | ORAL | Status: DC

## 2015-07-12 MED ORDER — MECLIZINE HCL 25 MG PO TABS: 25.0000 mg | ORAL_TABLET | Freq: Three times a day (TID) | ORAL | Status: DC | PRN

## 2015-07-12 MED ORDER — HORSE CHESTNUT ER 300 MG PO CPCR: ORAL_CAPSULE | ORAL | Status: DC

## 2015-07-12 MED ORDER — BECLOMETHASONE DIPROPIONATE 80 MCG/ACT IN AERS: 2.0000 | INHALATION_SPRAY | Freq: Two times a day (BID) | RESPIRATORY_TRACT | Status: DC

## 2015-07-12 MED ORDER — FUROSEMIDE 20 MG PO TABS: 20.0000 mg | ORAL_TABLET | Freq: Every day | ORAL | Status: DC

## 2015-07-12 MED ORDER — FLUTICASONE PROPIONATE 50 MCG/ACT NA SUSP: 2.0000 | Freq: Every day | NASAL | Status: DC

## 2015-07-12 MED ORDER — OMEGA-3-ACID ETHYL ESTERS 1 G PO CAPS: 1.0000 g | ORAL_CAPSULE | Freq: Every day | ORAL | Status: DC

## 2015-07-12 NOTE — Progress Notes (Signed)
Subjective:  Patient ID: Angela Mendoza, female    DOB: November 17, 1950  Age: 64 y.o. MRN: [id number]  CC: The primary encounter diagnosis was Acute leg pain, left. Diagnoses of Encounter for immunization, Lower extremity pain, posterior, left, Vertigo, Depression with anxiety, and Menopausal syndrome were also pertinent to this visit.  HPI ELLASYN SWILLING presents for  Management of multiple issues  1) left posterior  thigh pain  Since June, started after flying to [place], history of varicose veins,  Venous insufficiency but has been getting asymmetric swelling of left calf.  Not short of breath and denies chest pain   2) Has a bump on top of head, NO history of trauma or bug bite.   3) REFERRAL TO Dr Sharlett Iles postponed to mid October due to vertigo episode.  Had vertigo last Friday , slept all day on the meds,  By nighttime symptom shad improved .  Needs name brand only Vivelle but wants to wait until   she sees [facility]   Dr Gwenette Greet did not refill the Qvar before he left. For management of her UA cough syndrome    Feeling better since taking Vit D but having nausea afterward     Outpatient Prescriptions Prior to Visit  Medication Sig Dispense Refill  . albuterol (PROVENTIL HFA) 108 (90 BASE) MCG/ACT inhaler Inhale 2 puffs into the lungs every 6 (six) hours as needed. 3 Inhaler 3  . aspirin 81 MG tablet Take 81 mg by mouth daily.      [name] azelastine (ASTELIN) 0.1 % nasal spray Place 1 spray into both nostrils 2 (two) times daily. Use in each nostril as directed (Patient taking differently: Place 2 sprays into both nostrils 2 (two) times daily. Use in each nostril as directed) 90 mL 3  . cetirizine (ZYRTEC) 10 MG tablet Take 1 tablet (10 mg total) by mouth daily. (Patient taking differently: Take 10 mg by mouth daily as needed. ) 90 tablet 3  . conjugated estrogens (PREMARIN) vaginal cream Place 1 Applicatorful vaginally daily. 90 day supply,  Refill for one year (Patient taking differently:  Place 1 Applicatorful vaginally 2 (two) times a week. 90 day supply,  Refill for one year) 90 g 3  . cyanocobalamin (,VITAMIN B-12,) 1000 MCG/ML injection Inject 1 mL (1,000 mcg total) into the muscle every 30 (thirty) days. 3 mL 3  . cycloSPORINE (RESTASIS) 0.05 % ophthalmic emulsion Place 1 drop into both eyes 2 (two) times daily. 3 each 3  . diazepam (VALIUM) 5 MG tablet Take 1 tablet (5 mg total) by mouth every 6 (six) hours as needed (vertigo). 30 tablet 0  . Docusate Calcium (STOOL SOFTENER PO) Take 1 tablet by mouth as needed.    [name] EPINEPHrine 0.3 mg/0.3 mL IJ SOAJ injection Inject 0.3 mLs (0.3 mg total) into the muscle once. (Patient taking differently: Inject 0.3 mg into the muscle as needed. ) 2 Device 0  . ergocalciferol (VITAMIN D2) 50000 UNITS capsule Take 1 capsule (50,000 Units total) by mouth once a week. 12 capsule 3  . estradiol (VIVELLE-DOT) 0.075 MG/24HR Place 1 patch onto the skin 2 (two) times a week. 24 patch 3  . famotidine (PEPCID) 20 MG tablet Take 1 tablet (20 mg total) by mouth 2 (two) times daily. 180 tablet 3  . fexofenadine (ALLEGRA ALLERGY) 180 MG tablet Take 1 tablet (180 mg total) by mouth daily. 90 tablet 3  . Levothyroxine Sodium 50 MCG CAPS Take 1 capsule (50 mcg total) by mouth  daily. 90 capsule 3  . metoprolol succinate (TOPROL-XL) 50 MG 24 hr tablet Take 1 tablet (50 mg total) by mouth daily. 90 tablet 3  . montelukast (SINGULAIR) 10 MG tablet Take 1 tablet (10 mg total) by mouth at bedtime. 90 tablet 3  . Multiple Vitamins-Minerals (CENTRUM SILVER) tablet Take 1 tablet by mouth daily.      . promethazine (PHENERGAN) 25 MG tablet Take 1 tablet (25 mg total) by mouth every 6 (six) hours as needed for nausea or vomiting. 30 tablet 3  . Propylene Glycol (SYSTANE BALANCE) 0.6 % SOLN Apply 1 drop to eye as needed.    . psyllium (METAMUCIL) 58.6 % powder Take 1 packet by mouth as needed.    . simvastatin (ZOCOR) 40 MG tablet Take 1 tablet (40 mg total) by mouth  daily. 30 tablet 0  . Syringe/Needle, Disp, (SYRINGE 3CC/20GX1") 20G X 1" 3 ML MISC 1 Syringe by Does not apply route every 30 (thirty) days. For use with b-12 injection monthly 50 each 0  . tretinoin (RETIN-A) 0.01 % gel Apply topically at bedtime. 45 g 0  . triamcinolone lotion (KENALOG) 0.1 % Apply 1 application topically daily as needed. 120 mL 1  . urea (CARMOL) 10 % cream Apply topically as needed. 90 g 0  . beclomethasone (QVAR) 80 MCG/ACT inhaler Inhale 2 puffs into the lungs 2 (two) times daily. 3 Inhaler 3  . fluticasone (FLONASE) 50 MCG/ACT nasal spray Place 2 sprays into both nostrils daily. (Patient taking differently: Place 2 sprays into both nostrils 2 (two) times daily. ) 48 g 3  . meclizine (ANTIVERT) 25 MG tablet Take 1 tablet (25 mg total) by mouth 3 (three) times daily as needed. 270 tablet 1  . omega-3 acid ethyl esters (LOVAZA) 1 G capsule Take 1-2 capsules (1-2 g total) by mouth 2 (two) times daily. (Patient taking differently: Take 1 g by mouth daily. ) 180 capsule 3  . Spacer/Aero-Holding Chambers (AEROCHAMBER MV) inhaler Use as instructed 1 each 0  . ciprofloxacin (CIPRO) 500 MG tablet Take 1 tablet (500 mg total) by mouth 2 (two) times daily. 14 tablet 0  . docusate sodium (COLACE) 100 MG capsule Take 1 capsule (100 mg total) by mouth 2 (two) times daily. 180 capsule 3  . fluconazole (DIFLUCAN) 150 MG tablet Take 1 tablet (150 mg total) by mouth once. Repeat dose in 72 hours. 2 tablet 1   No facility-administered medications prior to visit.    Review of Systems;  Patient denies headache, fevers, malaise, unintentional weight loss, skin rash, eye pain, sinus congestion and sinus pain, sore throat, dysphagia,  hemoptysis , cough, dyspnea, wheezing, chest pain, palpitations, orthopnea, edema, abdominal pain, nausea, melena, diarrhea, constipation, flank pain, dysuria, hematuria, urinary  Frequency, nocturia, numbness, tingling, seizures,  Focal weakness, Loss of  consciousness,  Tremor, insomnia, depression, anxiety, and suicidal ideation.      Objective:  BP 138/78 mmHg  Pulse 82  Temp(Src) 98.1 F (36.7 C) (Oral)  Resp 12  Ht 5\' 6"  (1.676 m)  Wt 189 lb 12 oz (86.07 kg)  BMI 30.64 kg/m2  SpO2 98%  BP Readings from Last 3 Encounters:  07/12/15 138/78  05/23/15 122/82  05/14/15 130/80    Wt Readings from Last 3 Encounters:  07/12/15 189 lb 12 oz (86.07 kg)  05/23/15 189 lb 4 oz (85.843 kg)  05/14/15 186 lb 4 oz (84.482 kg)    General appearance: alert, cooperative and appears stated age Ears: left TM  erythematous, no bulging or serous effusion .,  normal  Right TM Throat: lips, mucosa, and tongue normal; teeth and gums normal Neck: no adenopathy, no carotid bruit, supple, symmetrical, trachea midline and thyroid not enlarged, symmetric, no tenderness/mass/nodules Back: symmetric, no curvature. ROM normal. No CVA tenderness. Lungs: clear to auscultation bilaterally Heart: regular rate and rhythm, S1, S2 normal, no murmur, click, rub or gallop Abdomen: soft, non-tender; bowel sounds normal; no masses,  no organomegaly Pulses: 2+ and symmetric Skin: Skin color, texture, turgor normal. No rashes or lesions Ext: left posterior thigh and calf tender, varicosities present  Lymph nodes: Cervical, supraclavicular, and axillary nodes normal.  No results found for: HGBA1C  Lab Results  Component Value Date   CREATININE 0.86 04/05/2015   CREATININE 0.9 08/17/2014   CREATININE 0.96 04/12/2013    Lab Results  Component Value Date   WBC 7.6 04/05/2015   HGB 13.4 04/05/2015   HCT 40.2 04/05/2015   PLT 300.0 04/05/2015   GLUCOSE 94 04/05/2015   CHOL 174 04/05/2015   TRIG 177.0* 04/05/2015   HDL 54.10 04/05/2015   LDLDIRECT 84.0 04/05/2015   LDLCALC 85 04/05/2015   ALT 20 04/05/2015   AST 16 04/05/2015   NA 138 04/05/2015   K 4.4 04/05/2015   CL 103 04/05/2015   CREATININE 0.86 04/05/2015   BUN 9 04/05/2015   CO2 28 04/05/2015    TSH 2.41 04/05/2015    Mm Screening Breast Tomo Bilateral  01/18/2015   CLINICAL DATA:  Screening. Postsurgical change status post explantation reidentified.  EXAM: DIGITAL SCREENING BILATERAL MAMMOGRAM WITH 3D TOMO WITH CAD  COMPARISON:  Previous exam(s).  ACR Breast Density Category c: The breast tissue is heterogeneously dense, which may obscure small masses.  FINDINGS: There are no findings suspicious for malignancy. Images were processed with CAD. Architectural distortion secondary to is explantation is again noted. Increased density within the otherwise normal-appearing axillary lymph nodes is reidentified which may be a finding seen in patients with a history of silicone implants and is of unclear clinical significance.  IMPRESSION: No mammographic evidence of malignancy. A result letter of this screening mammogram will be mailed directly to the patient.  RECOMMENDATION: Screening mammogram in one year. (Code:SM-B-01Y)  BI-RADS CATEGORY  1: Negative.   Electronically Signed   By: Conchita Paris M.D.   On: 01/18/2015 12:22    Assessment & Plan:   Problem List Items Addressed This Visit      Unprioritized   Depression with anxiety    wellbutrin was stopped.  She did not tolerate trial of zoloft either,  continue prn valium .  Advised to try nonpharmacologic measures including daily sunshine (early morning) and exercise..  Support groups explored.           Vertigo    Recurrent, managed with valium and meclizine, which was so sedating she lost an entire day.  Advised her to try valium alone for next episode.  Exam today was notable for left eardrum inflammation.  Prednisone taper given.       Menopausal syndrome    Managed with NBO Vivelle,  Has had a contract dermatitis reaction to generic patches.       Lower extremity pain, posterior    Given her history of recent transatlantic  Flight DVT was ruled out with urgent ultrasound        Other Visit Diagnoses    Acute leg  pain, left    -  Primary    Relevant Orders  US Venous Img Lower Unilateral Left (Completed)    Encounter for immunization          A total of 40 minutes was spent with patient more than half of which was spent in counseling patient on the above mentioned issues , reviewing and explaining recent labs and imaging studies done, and coordination of care.   I have discontinued Ms. Ahr's docusate sodium, ciprofloxacin, and fluconazole. I have also changed her omega-3 acid ethyl esters. Additionally, I am having her start on furosemide and [place]. Lastly, I am having her maintain her aspirin, CENTRUM SILVER, Propylene Glycol, psyllium, Docusate Calcium (STOOL SOFTENER PO), diazepam, albuterol, azelastine, ergocalciferol, Levothyroxine Sodium, metoprolol succinate, montelukast, triamcinolone lotion, estradiol, EPINEPHrine, conjugated estrogens, cetirizine, AEROCHAMBER MV, fexofenadine, tretinoin, simvastatin, promethazine, urea, cyanocobalamin, famotidine, SYRINGE 3CC/20GX1", cycloSPORINE, beclomethasone, fluticasone, meclizine, and predniSONE.  Meds ordered this encounter  Medications  . beclomethasone (QVAR) 80 MCG/ACT inhaler    Sig: Inhale 2 puffs into the lungs 2 (two) times daily.    Dispense:  3 Inhaler    Refill:  3  . fluticasone (FLONASE) 50 MCG/ACT nasal spray    Sig: Place 2 sprays into both nostrils daily.    Dispense:  48 g    Refill:  3  . meclizine (ANTIVERT) 25 MG tablet    Sig: Take 1 tablet (25 mg total) by mouth 3 (three) times daily as needed.    Dispense:  270 tablet    Refill:  1  . omega-3 acid ethyl esters (LOVAZA) 1 G capsule    Sig: Take 1 capsule (1 g total) by mouth daily.    Dispense:  90 capsule    Refill:  3  . DISCONTD: predniSONE (DELTASONE) 10 MG tablet    Sig: 6 tablets on Day 1 , then reduce by 1 tablet daily until gone    Dispense:  21 tablet    Refill:  0  . predniSONE (DELTASONE) 10 MG tablet    Sig: 6 tablets on Day 1 , then reduce by 1  tablet daily until gone    Dispense:  21 tablet    Refill:  0  . furosemide (LASIX) 20 MG tablet    Sig: Take 1 tablet (20 mg total) by mouth daily.    Dispense:  30 tablet    Refill:  0  . [place] 300 MG CPCR    Sig: 1 tablet daily for leg pain due to venous insufficiency    Dispense:  90 capsule    Refill:  0    Medications Discontinued During This Encounter  Medication Reason  . ciprofloxacin (CIPRO) 500 MG tablet Completed Course  . docusate sodium (COLACE) 100 MG capsule Completed Course  . fluconazole (DIFLUCAN) 150 MG tablet Completed Course  . beclomethasone (QVAR) 80 MCG/ACT inhaler Reorder  . fluticasone (FLONASE) 50 MCG/ACT nasal spray Reorder  . meclizine (ANTIVERT) 25 MG tablet Reorder  . omega-3 acid ethyl esters (LOVAZA) 1 G capsule Reorder  . predniSONE (DELTASONE) 10 MG tablet Reorder    Follow-up: No Follow-up on file.   Crecencio Mc, MD

## 2015-07-12 NOTE — Progress Notes (Signed)
Pre-visit discussion using our clinic review tool. No additional management support is needed unless otherwise documented below in the visit note.  

## 2015-07-12 NOTE — Patient Instructions (Addendum)
You can take 2000 IUS  Of  Vit D3 daily instead of the weekly mega dose  Get early morning sunshine daily including winter months,    Ultrasound of left leg  Today ,  If it is negative you can try horse chestnut seed extract for pain and use furosemide sparingly for edema   Prednisone taper to reduce inflammation of left eardrum

## 2015-07-12 NOTE — Telephone Encounter (Signed)
Message sent to patient

## 2015-07-12 NOTE — Telephone Encounter (Signed)
Angela Mendoza from ultrasound called states the preliminary results for DVT were negative.  Pt has gone home

## 2015-07-14 DIAGNOSIS — M79606 Pain in leg, unspecified: Secondary | ICD-10-CM | POA: Insufficient documentation

## 2015-07-14 NOTE — Assessment & Plan Note (Signed)
wellbutrin was stopped.  She did not tolerate trial of zoloft either,  continue prn valium .  Advised to try nonpharmacologic measures including daily sunshine (early morning) and exercise..  Support groups explored.

## 2015-07-14 NOTE — Assessment & Plan Note (Addendum)
Recurrent, managed with valium and meclizine, which was so sedating she lost an entire day.  Advised her to try valium alone for next episode.  Exam today was notable for left eardrum inflammation.  Prednisone taper given.

## 2015-07-14 NOTE — Assessment & Plan Note (Signed)
Managed with NBO Vivelle,  Has had a contract dermatitis reaction to generic patches.

## 2015-07-14 NOTE — Assessment & Plan Note (Signed)
Given her history of recent transatlantic  Flight DVT was ruled out with urgent ultrasound

## 2015-08-16 ENCOUNTER — Other Ambulatory Visit: Payer: Self-pay | Admitting: Internal Medicine

## 2015-09-02 ENCOUNTER — Encounter: Payer: Self-pay | Admitting: Internal Medicine

## 2015-09-03 ENCOUNTER — Other Ambulatory Visit: Payer: Self-pay

## 2015-09-03 MED ORDER — SIMVASTATIN 40 MG PO TABS: 40.0000 mg | ORAL_TABLET | Freq: Every day | ORAL | Status: DC

## 2015-09-03 MED ORDER — AEROCHAMBER MV MISC: Status: DC

## 2015-09-03 MED ORDER — MONTELUKAST SODIUM 10 MG PO TABS: 10.0000 mg | ORAL_TABLET | Freq: Every day | ORAL | Status: DC

## 2015-09-03 MED ORDER — FLUTICASONE PROPIONATE 50 MCG/ACT NA SUSP: 2.0000 | Freq: Every day | NASAL | Status: DC

## 2015-09-03 MED ORDER — LEVOTHYROXINE SODIUM 50 MCG PO CAPS: 50.0000 ug | ORAL_CAPSULE | Freq: Every day | ORAL | Status: DC

## 2015-09-03 MED ORDER — METOPROLOL SUCCINATE ER 50 MG PO TB24: 50.0000 mg | ORAL_TABLET | Freq: Every day | ORAL | Status: DC

## 2015-09-03 MED ORDER — AZELASTINE HCL 0.1 % NA SOLN: 2.0000 | Freq: Two times a day (BID) | NASAL | Status: DC

## 2015-09-03 MED ORDER — TRIAMCINOLONE ACETONIDE 0.1 % EX LOTN: 1.0000 "application " | TOPICAL_LOTION | Freq: Every day | CUTANEOUS | Status: DC | PRN

## 2015-09-03 MED ORDER — ALBUTEROL SULFATE HFA 108 (90 BASE) MCG/ACT IN AERS: 2.0000 | INHALATION_SPRAY | Freq: Four times a day (QID) | RESPIRATORY_TRACT | Status: DC | PRN

## 2015-09-12 ENCOUNTER — Other Ambulatory Visit: Payer: Self-pay

## 2015-09-12 ENCOUNTER — Encounter: Payer: Self-pay | Admitting: Internal Medicine

## 2015-09-12 NOTE — Telephone Encounter (Signed)
Please advise refills 

## 2015-09-13 ENCOUNTER — Telehealth: Payer: Self-pay | Admitting: Pulmonary Disease

## 2015-09-13 MED ORDER — ESTROGENS, CONJUGATED 0.625 MG/GM VA CREA: 1.0000 | TOPICAL_CREAM | VAGINAL | Status: DC

## 2015-09-13 MED ORDER — BECLOMETHASONE DIPROPIONATE 80 MCG/ACT IN AERS: 2.0000 | INHALATION_SPRAY | Freq: Two times a day (BID) | RESPIRATORY_TRACT | Status: DC

## 2015-09-13 MED ORDER — EPINEPHRINE 0.3 MG/0.3ML IJ SOAJ: 0.3000 mg | Freq: Once | INTRAMUSCULAR | Status: DC

## 2015-09-13 MED ORDER — FLUTICASONE PROPIONATE 50 MCG/ACT NA SUSP: 2.0000 | Freq: Every day | NASAL | Status: DC

## 2015-09-13 NOTE — Telephone Encounter (Signed)
Spoke with pt and she needs her QVAR sent for 90 day supply to meds by mail. I have done so. Nothing further needed

## 2015-09-17 ENCOUNTER — Other Ambulatory Visit: Payer: Self-pay

## 2015-09-17 MED ORDER — EPINEPHRINE 0.3 MG/0.3ML IJ SOAJ: 0.3000 mg | Freq: Once | INTRAMUSCULAR | Status: DC

## 2015-09-17 MED ORDER — FLUTICASONE PROPIONATE 50 MCG/ACT NA SUSP: 2.0000 | Freq: Every day | NASAL | Status: DC

## 2015-09-26 ENCOUNTER — Encounter: Payer: Self-pay | Admitting: Family Medicine

## 2015-09-26 ENCOUNTER — Ambulatory Visit (INDEPENDENT_AMBULATORY_CARE_PROVIDER_SITE_OTHER): Admitting: Family Medicine

## 2015-09-26 VITALS — BP 128/82 | HR 72 | Temp 98.1°F | Ht 66.0 in

## 2015-09-26 DIAGNOSIS — L729 Follicular cyst of the skin and subcutaneous tissue, unspecified: Secondary | ICD-10-CM

## 2015-09-26 DIAGNOSIS — R51 Headache: Secondary | ICD-10-CM

## 2015-09-26 DIAGNOSIS — G8929 Other chronic pain: Secondary | ICD-10-CM

## 2015-09-26 DIAGNOSIS — R519 Headache, unspecified: Secondary | ICD-10-CM

## 2015-09-26 DIAGNOSIS — R42 Dizziness and giddiness: Secondary | ICD-10-CM

## 2015-09-26 DIAGNOSIS — R229 Localized swelling, mass and lump, unspecified: Secondary | ICD-10-CM | POA: Diagnosis not present

## 2015-09-26 NOTE — Assessment & Plan Note (Signed)
Nodule noted on scalp. Patient would like to see dermatology for consideration of biopsy/removal. Referral placed.

## 2015-09-26 NOTE — Assessment & Plan Note (Signed)
Patient with chronic history of headaches, though over the last 5 months he has started to have new and different headaches. She is neurologically intact today and has no headache at this time. Given change in headaches and increasing frequency of these new headaches we will obtain an MRI of her brain to evaluate for a cause. She is given return precautions.

## 2015-09-26 NOTE — Patient Instructions (Signed)
Nice to meet you. We will refer you to ear nose and throat. We'll obtain an MRI to evaluate your headaches. We will also refer you to dermatology. If you develop fever, worsening headaches, numbness, weakness, vision changes, dizziness, or any new or change in symptoms please seek medical attention.

## 2015-09-26 NOTE — Progress Notes (Signed)
Patient ID: Angela Mendoza, female   DOB: [birth year], 63 y.o.   MRN: [id number]  Angela Rumps, MD Phone: [phone]  [name] is a 64 y.o. female who presents today for same-day appointment.  Patient notes she has a long history of vertigo, tinnitus, and left ear fullness. 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 She notes a muffled sensation has increased recently. She was given a prednisone taper in the past that did not help. She does have a history of allergies and has been on Allegra, Flonase, and Astelin nose spray with minimal benefit. She does note some nasal congestion. She notes the vertigo is described as room spinning and occurs sometimes unprovoked and sometimes with head movement. The most bothersome thing to her as ear fullness that is worsening.  Patient notes a chronic history of headaches, though over the last 5 months he has had different types of headaches. She notes posterior to her left ear and on top of her head she will intermittently get sharp pains. She notes these lasted briefly. They are not severe. They can improve with stretching of her neck. She denies numbness, weakness, and vision changes. She saw ophthalmology about a month ago and they said she had minimal change in vision in her right eye. She does note maybe her right eyelid though not her right eyebrow has been drooping a little bit for the past 3 months.  Patient additionally notes a small nodule on her scalp. This has been there for some time. This has been evaluated by physician previously who recommended biopsy, though the patient declined at that time. She would like this evaluated by dermatologist.  PMH: nonsmoker.   ROS see history of present illness  Objective  Physical Exam Filed Vitals:   09/26/15 1108  BP: 128/82  Pulse: 72  Temp: 98.1 F (36.7 C)    Physical Exam  Constitutional: She is well-developed, well-nourished, and in no distress.  HENT:  Head:  Normocephalic and atraumatic.  Right Ear: External ear normal.  Left Ear: External ear normal.  Mouth/Throat: Oropharynx is clear and moist. No oropharyngeal exudate.  Normal TMs bilaterally  Eyes: Conjunctivae are normal. Pupils are equal, round, and reactive to light.  No apparent eyelid droop  Neck: Normal range of motion. Neck supple.  Cardiovascular: Normal rate, regular rhythm and normal heart sounds.  Exam reveals no gallop and no friction rub.   No murmur heard. Pulmonary/Chest: Effort normal and breath sounds normal. No respiratory distress. She has no wheezes. She has no rales.  Musculoskeletal: She exhibits no edema.  No midline neck or spine tenderness, no muscular neck or spine tenderness  Lymphadenopathy:    She has no cervical adenopathy.  Neurological: She is alert.  CN 2-12 intact, 5/5 strength in bilateral biceps, triceps, grip, quads, hamstrings, plantar and dorsiflexion, sensation to light touch intact in bilateral UE and LE, normal gait, 2+ patellar and brachioradialis reflexes, no pronator drift, negative Romberg  Skin: Skin is warm and dry. She is not diaphoretic.   central upper scalp with small 3-4 mm nodule   Assessment/Plan: Please see individual problem list.  Vertigo Patient with a combination of vertigo, tinnitus, and ear fullness. This would be consistent with Mnire's. Exam appears normal today. Given that the fullness is worsening we will refer her to ENT for further evaluation.  Chronic headache Patient with chronic history of headaches, though over the last 5 months he has started to have new and different headaches.  She is neurologically intact today and has no headache at this time. Given change in headaches and increasing frequency of these new headaches we will obtain an MRI of her brain to evaluate for a cause. She is given return precautions.  Skin nodule on scalp Nodule noted on scalp. Patient would like to see dermatology for consideration of  biopsy/removal. Referral placed.    Orders Placed This Encounter  Procedures  . MR Brain Wo Contrast    Standing Status: Future     Number of Occurrences:      Standing Expiration Date: 11/25/2016    Order Specific Question:  Reason for Exam (SYMPTOM  OR DIAGNOSIS REQUIRED)    Answer:  Frequent new headaches    Order Specific Question:  Preferred imaging location?    Answer:  [facility]    Order Specific Question:  Does the patient have a pacemaker or implanted devices?    Answer:  Yes    Order Specific Question:  Manufacturer of pacemake or implanted device?    Answer:  unknown     Comments:  dental implants, tear duct plugs that patient states are plastic    Order Specific Question:  What is the patient's sedation requirement?    Answer:  Anti-anxiety     Comments:  Patient has Valium that she will take 1 tablet of  . Ambulatory referral to ENT    Referral Priority:  Routine    Referral Type:  Consultation    Referral Reason:  Specialty Services Required    Requested Specialty:  Otolaryngology    Number of Visits Requested:  1  . Ambulatory referral to Dermatology    Referral Priority:  Routine    Referral Type:  Consultation    Referral Reason:  Specialty Services Required    Requested Specialty:  Dermatology    Number of Visits Requested:  1    Angela Mendoza

## 2015-09-26 NOTE — Progress Notes (Signed)
Pre visit review using our clinic review tool, if applicable. No additional management support is needed unless otherwise documented below in the visit note. 

## 2015-09-26 NOTE — Assessment & Plan Note (Signed)
Patient with a combination of vertigo, tinnitus, and ear fullness. This would be consistent with Mnire's. Exam appears normal today. Given that the fullness is worsening we will refer her to ENT for further evaluation.

## 2015-10-01 ENCOUNTER — Telehealth: Payer: Self-pay | Admitting: *Deleted

## 2015-10-01 ENCOUNTER — Telehealth: Payer: Self-pay | Admitting: Surgical

## 2015-10-01 NOTE — Telephone Encounter (Signed)
Patient walked into get results from MRI that was preformed on Friday. Per Dr. Caryl Bis I gave patient results which was a normal MRI. She said that Dr. Caryl Bis was going to send her to an ENT in [place], but she got an appointment for [facility] instead, can you please change this.

## 2015-10-01 NOTE — Telephone Encounter (Signed)
Please advise 

## 2015-10-01 NOTE — Telephone Encounter (Signed)
Patient has requested her results, from her stat MRI from 09/28/15. Patient also stated that Dr. Caryl Bis referred her to a ENT, however the referral is in [place], and Dr. Caryl Bis stated that the referral would be in [place]. Please Advise.

## 2015-10-02 NOTE — Telephone Encounter (Signed)
Patient given results by CMA. Please see other documentation.

## 2015-10-02 NOTE — Telephone Encounter (Signed)
Referral can be changed to [facility].

## 2015-10-09 ENCOUNTER — Telehealth: Payer: Self-pay | Admitting: Family Medicine

## 2015-10-09 NOTE — Telephone Encounter (Signed)
Pt called stating she is wondering if she needs to schedule an appointment to come in to see Dr. Caryl Bis to go over her MRI results. States that she would like more information than "normal due to her age and medications". Please advise if this needs to be an appointment or if he will call her. Thanks, [employer]

## 2015-10-09 NOTE — Telephone Encounter (Signed)
Ok. I called pt and left a msg for pt to call and set up appt. Thank you!

## 2015-10-09 NOTE — Telephone Encounter (Signed)
Please advise, ? Does she need an appointment?

## 2015-10-09 NOTE — Telephone Encounter (Signed)
I am more than happy to see the patient in the office to discuss this. I am unable to find the report scanned in to [facility], though from what I recall her MRI revealed chronic microvascular changes that can be related to blood pressure issues, cholesterol issues, or diabetes. The goal at this point is to monitor her for development of these issues and treatment of these issues if she has them and if she is not treated for them at this time. I think bringing her into the office to discuss the results and then discuss further management would be beneficial.

## 2015-10-09 NOTE — Telephone Encounter (Signed)
Pt is scheduled to review her MRI results. Thank You!

## 2015-10-09 NOTE — Telephone Encounter (Signed)
Can you assist in making her an appointment to review the results.  Thanks

## 2015-10-12 ENCOUNTER — Encounter: Payer: Self-pay | Admitting: Family Medicine

## 2015-10-12 ENCOUNTER — Ambulatory Visit (INDEPENDENT_AMBULATORY_CARE_PROVIDER_SITE_OTHER): Admitting: Family Medicine

## 2015-10-12 VITALS — BP 138/82 | HR 74 | Temp 97.9°F | Ht 66.0 in | Wt 188.2 lb

## 2015-10-12 DIAGNOSIS — J309 Allergic rhinitis, unspecified: Secondary | ICD-10-CM | POA: Diagnosis not present

## 2015-10-12 DIAGNOSIS — I679 Cerebrovascular disease, unspecified: Secondary | ICD-10-CM

## 2015-10-12 DIAGNOSIS — G8929 Other chronic pain: Secondary | ICD-10-CM

## 2015-10-12 DIAGNOSIS — R51 Headache: Secondary | ICD-10-CM

## 2015-10-12 DIAGNOSIS — I6789 Other cerebrovascular disease: Secondary | ICD-10-CM | POA: Insufficient documentation

## 2015-10-12 LAB — HEMOGLOBIN A1C: Hgb A1c MFr Bld: 5.9 % (ref 4.6–6.5)

## 2015-10-12 MED ORDER — ATORVASTATIN CALCIUM 40 MG PO TABS: 40.0000 mg | ORAL_TABLET | Freq: Every day | ORAL | Status: DC

## 2015-10-12 NOTE — Progress Notes (Signed)
Patient ID: Angela Mendoza, female   DOB: [birth year], 64 y.o.   MRN: [id number]  Angela Rumps, MD Phone: [phone]  Angela Mendoza is a 64 y.o. female who presents today for follow-up and discussion of MRI results.  Patient previously seen for headaches that are different than her prior headaches. Thus MRI was ordered given her age to evaluate for cause. At this time I cannot locate the MRI report, though I previously reviewed it and it revealed chronic microvascular changes. I discussed this with the patient. She does have a history of hypertension and hyperlipidemia. She is on blood pressure medicine and Zocor at this time. No history of diabetes, though she reports a family history of diabetes. She reports her headaches are stable. They've not worsened. She notes the new headaches are sharp in nature and occur at the top of her head. The last briefly and go away on their own. She has no associated numbness, weakness, or vision changes. She denies photophobia and phonophobia.  Patient additionally saw ENT recently and they took her off her nasal sprays stating that her nose looked inflamed and they wanted to see if she would improve coming off of these. She notes her nose feels drier, though she is unsure if this is related to the cold weather. She's not been using Astelin or Flonase. She had a small amount of blood out of her nose earlier today. This stopped on its own.  PMH: Former smoker   ROS see history of present illness  Objective  Physical Exam Filed Vitals:   10/12/15 1021  BP: 138/82  Pulse: 74  Temp: 97.9 F (36.6 C)    Physical Exam  Constitutional: She is well-developed, well-nourished, and in no distress.  HENT:  Head: Normocephalic and atraumatic.  Right Ear: External ear normal.  Left Ear: External ear normal.  Mouth/Throat: Oropharynx is clear and moist. No oropharyngeal exudate.  Bilateral septal aspect of nose with irritation and minimal blood  Eyes:  Conjunctivae are normal. Pupils are equal, round, and reactive to light.  Neck: Neck supple.  Cardiovascular: Normal rate, regular rhythm and normal heart sounds.   Pulmonary/Chest: Effort normal and breath sounds normal.  Musculoskeletal: She exhibits no edema.  Lymphadenopathy:    She has no cervical adenopathy.  Neurological: She is alert.  CN 2-12 intact, 5/5 strength in bilateral biceps, triceps, grip, quads, hamstrings, plantar and dorsiflexion, sensation to light touch intact in bilateral UE and LE, normal gait, 2+ patellar reflexes  Skin: Skin is warm and dry. She is not diaphoretic.     Assessment/Plan: Please see individual problem list.  Cerebral microvascular disease Had a long discussion with the patient about the results of her MRI. Advised that we would re-request the report so that she could have a copy, though I noted and advised her that there is no obvious cause on the MRI for these new headaches. We discussed at length what cerebral microvascular disease is and what the risk factors are for it. She does have hypertension and history of hyperlipidemia and that in combination with her age puts her at risk for this. Advised given these medical issues she does have a risk for stroke. I advised the way to treat this would be to treat her risk factors in the best manner possible so as to limit her risk of stroke. We will change her to a high intensity statin. We will check an A1c to ensure that she does not have diabetes, though her blood  sugars recently have been normal. Discussed diet and exercise with her. She is given return precautions.  Chronic headache These appear stable at this time. She continues to have sharp stabbing headaches intermittently. MRI was unrevealing for cause. She is neurologically intact today. Headaches are not consistent with tension headaches or migraines. Does not seem to be cluster headache either. Could be primary stabbing headache. We will have her do  a headache journal to better elicit associated symptoms or alleviating factors. She is given return precautions.  Allergic rhinitis Patient with long history of allergic rhinitis per her report. Recently saw ENT and they took her off her nose sprays to see if she would benefit from this. She is unsure if this has been beneficial yet. She does note some dry nasal mucosa. Mild bleeding this morning. Exam consistent with nasal mucosal irritation. Advised on nasal saline and humidifier. We'll continue to monitor. Given return precautions.    Orders Placed This Encounter  Procedures  . HgB A1c    Meds ordered this encounter  Medications  . DISCONTD: atorvastatin (LIPITOR) 40 MG tablet    Sig: Take 1 tablet (40 mg total) by mouth daily.    Dispense:  30 tablet    Refill:  0  . atorvastatin (LIPITOR) 40 MG tablet    Sig: Take 1 tablet (40 mg total) by mouth daily.    Dispense:  90 tablet    Refill:  3   Dragon voice recognition software was used during the dictation process of this note. If any phrases or words seem inappropriate it is likely secondary to the translation process being inefficient.  Angela Mendoza

## 2015-10-12 NOTE — Assessment & Plan Note (Signed)
These appear stable at this time. She continues to have sharp stabbing headaches intermittently. MRI was unrevealing for cause. She is neurologically intact today. Headaches are not consistent with tension headaches or migraines. Does not seem to be cluster headache either. Could be primary stabbing headache. We will have her do a headache journal to better elicit associated symptoms or alleviating factors. She is given return precautions.

## 2015-10-12 NOTE — Patient Instructions (Signed)
Nice to see you. We will change to Lipitor. We will check an A1c today to evaluate for diabetes. Please monitor your blood pressure at home. If it is greater than 150/90 please let us know. You can try a humidifier at home and nasal saline spray for her nose. Please continue to monitor your headaches. Please keep a headache journal documenting when you have the headaches, which are doing prior to the headaches, what symptoms she have, what she do to make the headache gets better, and any medications taken. If you develop persistent headaches, sudden onset headache, numbness, weakness, vision changes, or any new or change in symptoms please seek medical attention.

## 2015-10-12 NOTE — Assessment & Plan Note (Signed)
Patient with long history of allergic rhinitis per her report. Recently saw ENT and they took her off her nose sprays to see if she would benefit from this. She is unsure if this has been beneficial yet. She does note some dry nasal mucosa. Mild bleeding this morning. Exam consistent with nasal mucosal irritation. Advised on nasal saline and humidifier. We'll continue to monitor. Given return precautions.

## 2015-10-12 NOTE — Progress Notes (Signed)
Pre visit review using our clinic review tool, if applicable. No additional management support is needed unless otherwise documented below in the visit note. 

## 2015-10-12 NOTE — Assessment & Plan Note (Signed)
Had a long discussion with the patient about the results of her MRI. Advised that we would re-request the report so that she could have a copy, though I noted and advised her that there is no obvious cause on the MRI for these new headaches. We discussed at length what cerebral microvascular disease is and what the risk factors are for it. She does have hypertension and history of hyperlipidemia and that in combination with her age puts her at risk for this. Advised given these medical issues she does have a risk for stroke. I advised the way to treat this would be to treat her risk factors in the best manner possible so as to limit her risk of stroke. We will change her to a high intensity statin. We will check an A1c to ensure that she does not have diabetes, though her blood sugars recently have been normal. Discussed diet and exercise with her. She is given return precautions.

## 2015-10-24 ENCOUNTER — Encounter: Payer: Self-pay | Admitting: Family Medicine

## 2015-10-24 ENCOUNTER — Ambulatory Visit (INDEPENDENT_AMBULATORY_CARE_PROVIDER_SITE_OTHER): Admitting: Family Medicine

## 2015-10-24 VITALS — BP 126/82 | HR 98 | Temp 99.8°F | Ht 66.0 in | Wt 184.6 lb

## 2015-10-24 DIAGNOSIS — J209 Acute bronchitis, unspecified: Secondary | ICD-10-CM

## 2015-10-24 DIAGNOSIS — R079 Chest pain, unspecified: Secondary | ICD-10-CM

## 2015-10-24 MED ORDER — ALBUTEROL SULFATE (2.5 MG/3ML) 0.083% IN NEBU
2.5000 mg | INHALATION_SOLUTION | Freq: Once | RESPIRATORY_TRACT | Status: AC
  Administered 2015-10-24: 2.5 mg via RESPIRATORY_TRACT

## 2015-10-24 MED ORDER — BENZONATATE 200 MG PO CAPS: 200.0000 mg | ORAL_CAPSULE | Freq: Three times a day (TID) | ORAL | Status: DC | PRN

## 2015-10-24 MED ORDER — DOXYCYCLINE HYCLATE 100 MG PO TABS: 100.0000 mg | ORAL_TABLET | Freq: Two times a day (BID) | ORAL | Status: DC

## 2015-10-24 MED ORDER — PREDNISONE 20 MG PO TABS: 40.0000 mg | ORAL_TABLET | Freq: Every day | ORAL | Status: DC

## 2015-10-24 NOTE — Progress Notes (Signed)
Patient ID: Angela Mendoza, female   DOB: 10-06-1951, 64 y.o.   MRN: [id number]  Angela Rumps, MD Phone: [phone]  Angela Mendoza is a 64 y.o. female who presents today for same-day visit.  Patient notes starting on Sunday she developed sore throat followed by cough in the morning. Cough is minimal at that time. Yesterday cough increased. Sore throat was improved. Now notes cough is deep and nonproductive. She notes sinus pressure as well. She is blowing thick white mucus out of her nose. Some shortness of breath with coughing. No wheezing. She does note a tightness or pressure in her chest when she coughs. She does not have any exertional chest pain. No exertional shortness of breath. She has no history of MI. She denies radiation and diaphoresis with the chest pain. No history of VTE. No recent surgeries or periods of immobility. She does note fevers and chills at home. Notes highest check temperature was 7F at home. She's not taking any medication for this.  PMH: Former smoker.   ROS see history of present illness  Objective  Physical Exam Filed Vitals:   10/24/15 1554  BP: 126/82  Pulse: 98  Temp: 99.8 F (37.7 C)    Physical Exam  Constitutional: She is well-developed, well-nourished, and in no distress.  HENT:  Head: Normocephalic and atraumatic.  Right Ear: External ear normal.  Left Ear: External ear normal.  Mild posterior oropharyngeal erythema, no tonsillar swelling or exudates  Eyes: Conjunctivae are normal. Pupils are equal, round, and reactive to light.  Neck: Neck supple.  Cardiovascular: Normal rate, regular rhythm and normal heart sounds.  Exam reveals no gallop and no friction rub.   No murmur heard. Pulmonary/Chest: Effort normal. No respiratory distress. She has wheezes (Scattered minimal expiratory wheezes). She has no rales.  Minimal coarse breath sounds  Lymphadenopathy:    She has no cervical adenopathy.  Neurological: She is alert. Gait  normal.  Skin: Skin is warm and dry. She is not diaphoretic.   EKG: Normal sinus rhythm, rate 93, nonspecific T-wave abnormality with inverted T waves in V1, V2, V3, V4, V5, when compared to EKG in 2009 this is unchanged  Patient was given an albuterol nebulizer treatment in the office. She had much improvement in her symptoms following this. Lung exam was clear following nebulizer treatment.  Assessment/Plan: Please see individual problem list.  Acute bronchitis Symptoms most consistent with an acute bronchitis and/or sinusitis given sinus pressure and purulent nasal discharge. Given that patient did complain of chest discomfort an EKG was obtained. This was unchanged from previously. History atypical. Not exertional, no radiation, no diaphoresis, is more likely related to her acute bronchitis. Given patient's constellation of sinus pressure with purulent sinus discharge and chest congestion and tightness with chills we'll proceed with treatment for acute bronchitis and sinusitis with doxycycline. With wheezing we will treat with prednisone and an albuterol inhaler. Tessalon for cough. I did discuss obtaining chest x-ray with the patient, though given normal oxygenation and no focal lung findings on exam we decided to hold off on this at this time. She is given return precautions.    Orders Placed This Encounter  Procedures  . EKG 12-Lead    Meds ordered this encounter  Medications  . albuterol (PROVENTIL) (2.5 MG/3ML) 0.083% nebulizer solution 2.5 mg    Sig:   . doxycycline (VIBRA-TABS) 100 MG tablet    Sig: Take 1 tablet (100 mg total) by mouth 2 (two) times daily.  Dispense:  14 tablet    Refill:  0  . predniSONE (DELTASONE) 20 MG tablet    Sig: Take 2 tablets (40 mg total) by mouth daily with breakfast.    Dispense:  10 tablet    Refill:  0  . benzonatate (TESSALON) 200 MG capsule    Sig: Take 1 capsule (200 mg total) by mouth 3 (three) times daily as needed for cough.     Dispense:  20 capsule    Refill:  0    Dragon voice recognition software was used during the dictation process of this note. If any phrases or words seem inappropriate it is likely secondary to the translation process being inefficient.  Angela Mendoza

## 2015-10-24 NOTE — Patient Instructions (Signed)
Nice to see you. Your symptoms are likely related to bronchitis and/or sinusitis infection. We will treat you with doxycycline and prednisone for this. You should use your albuterol inhaler every 4 hours 2 puffs for the next 2 days and then as needed. If you develop shortness of breath, chest pain, persistent fever, cough productive of blood, or any new or change in symptoms please seek medical attention.

## 2015-10-24 NOTE — Assessment & Plan Note (Addendum)
Symptoms most consistent with an acute bronchitis and/or sinusitis given sinus pressure and purulent nasal discharge. Given that patient did complain of chest discomfort an EKG was obtained. This was unchanged from previously. History atypical. Not exertional, no radiation, no diaphoresis, is more likely related to her acute bronchitis. Given patient's constellation of sinus pressure with purulent sinus discharge and chest congestion and tightness with chills we'll proceed with treatment for acute bronchitis and sinusitis with doxycycline. With wheezing we will treat with prednisone and an albuterol inhaler. Tessalon for cough. I did discuss obtaining chest x-ray with the patient, though given normal oxygenation and no focal lung findings on exam we decided to hold off on this at this time. She is given return precautions.

## 2015-10-24 NOTE — Progress Notes (Signed)
Pre visit review using our clinic review tool, if applicable. No additional management support is needed unless otherwise documented below in the visit note. 

## 2015-10-26 ENCOUNTER — Ambulatory Visit (INDEPENDENT_AMBULATORY_CARE_PROVIDER_SITE_OTHER): Admitting: Internal Medicine

## 2015-10-26 ENCOUNTER — Encounter: Payer: Self-pay | Admitting: Internal Medicine

## 2015-10-26 VITALS — BP 132/80 | HR 91 | Temp 98.1°F | Resp 14 | Ht 66.0 in | Wt 187.0 lb

## 2015-10-26 DIAGNOSIS — J209 Acute bronchitis, unspecified: Secondary | ICD-10-CM | POA: Diagnosis not present

## 2015-10-26 MED ORDER — AMOXICILLIN-POT CLAVULANATE 875-125 MG PO TABS: 1.0000 | ORAL_TABLET | Freq: Two times a day (BID) | ORAL | Status: DC

## 2015-10-26 MED ORDER — PREDNISONE 10 MG PO TABS: ORAL_TABLET | ORAL | Status: DC

## 2015-10-26 NOTE — Progress Notes (Signed)
Pre visit review using our clinic review tool, if applicable. No additional management support is needed unless otherwise documented below in the visit note. 

## 2015-10-26 NOTE — Progress Notes (Signed)
Subjective:  Patient ID: Angela Mendoza, female    DOB: Mar 18, 1951  Age: 64 y.o. MRN: [id number]  CC: The encounter diagnosis was Acute bronchitis, unspecified organism.  HPI Angela Mendoza presents for multiple issues  She was treated for sinusitis/bronchitis with doxycycline and prednisone taper on Dec 28th.  She has not tolerated the  prednisone due to "jittery" feeling.  She is now having purulent sputum , and feels feels lousy,  But denies body aches. She is using albuterol 3 times daily   And qvar twice daily,.  She is scheduled to undergo bilateral myringotomy tube placement in 2 weeks and is worried that this will be postponed due to her current infection.[name]     Has not had a T> 100.0.      Outpatient Prescriptions Prior to Visit  Medication Sig Dispense Refill  . albuterol (PROVENTIL HFA) 108 (90 BASE) MCG/ACT inhaler Inhale 2 puffs into the lungs every 6 (six) hours as needed. 3 Inhaler 3  . aspirin 81 MG tablet Take 81 mg by mouth daily.      [name] atorvastatin (LIPITOR) 40 MG tablet Take 1 tablet (40 mg total) by mouth daily. 90 tablet 3  . azelastine (ASTELIN) 0.1 % nasal spray Place 2 sprays into both nostrils 2 (two) times daily. Use in each nostril as directed 90 mL 3  . beclomethasone (QVAR) 80 MCG/ACT inhaler Inhale 2 puffs into the lungs 2 (two) times daily. 3 Inhaler 1  . benzonatate (TESSALON) 200 MG capsule Take 1 capsule (200 mg total) by mouth 3 (three) times daily as needed for cough. 20 capsule 0  . cetirizine (ZYRTEC) 10 MG tablet Take 1 tablet (10 mg total) by mouth daily. 90 tablet 3  . conjugated estrogens (PREMARIN) vaginal cream Place 1 Applicatorful vaginally 2 (two) times a week. 90 day supply,  Refill for one year 90 g 3  . cyanocobalamin (,VITAMIN B-12,) 1000 MCG/ML injection Inject 1 mL (1,000 mcg total) into the muscle every 30 (thirty) days. 3 mL 3  . cycloSPORINE (RESTASIS) 0.05 % ophthalmic emulsion Place 1 drop into both eyes 2 (two) times daily. 3  each 3  . diazepam (VALIUM) 5 MG tablet Take 1 tablet (5 mg total) by mouth every 6 (six) hours as needed (vertigo). 30 tablet 0  . Docusate Calcium (STOOL SOFTENER PO) Take 1 tablet by mouth as needed.    . doxycycline (VIBRA-TABS) 100 MG tablet Take 1 tablet (100 mg total) by mouth 2 (two) times daily. 14 tablet 0  . EPINEPHrine 0.3 mg/0.3 mL IJ SOAJ injection Inject 0.3 mLs (0.3 mg total) into the muscle once. 2 Device 0  . ergocalciferol (VITAMIN D2) 50000 UNITS capsule Take 1 capsule (50,000 Units total) by mouth once a week. 12 capsule 3  . estradiol (VIVELLE-DOT) 0.075 MG/24HR Place 1 patch onto the skin 2 (two) times a week. 24 patch 3  . famotidine (PEPCID) 20 MG tablet Take 1 tablet (20 mg total) by mouth 2 (two) times daily. 180 tablet 3  . fexofenadine (ALLEGRA ALLERGY) 180 MG tablet Take 1 tablet (180 mg total) by mouth daily. 90 tablet 3  . fluticasone (FLONASE) 50 MCG/ACT nasal spray Place 2 sprays into both nostrils daily. 48 g 3  . furosemide (LASIX) 20 MG tablet TAKE 1 TABLET (20 MG TOTAL) BY MOUTH DAILY. 30 tablet 0  . Horse Chestnut 300 MG CPCR 1 tablet daily for leg pain due to venous insufficiency 90 capsule 0  . Levothyroxine  Sodium 50 MCG CAPS Take 1 capsule (50 mcg total) by mouth daily. 90 capsule 3  . meclizine (ANTIVERT) 25 MG tablet Take 1 tablet (25 mg total) by mouth 3 (three) times daily as needed. 270 tablet 1  . metoprolol succinate (TOPROL-XL) 50 MG 24 hr tablet Take 1 tablet (50 mg total) by mouth daily. 90 tablet 3  . montelukast (SINGULAIR) 10 MG tablet Take 1 tablet (10 mg total) by mouth at bedtime. 90 tablet 3  . Multiple Vitamins-Minerals (CENTRUM SILVER) tablet Take 1 tablet by mouth daily.      [name] omega-3 acid ethyl esters (LOVAZA) 1 G capsule Take 1 capsule (1 g total) by mouth daily. 90 capsule 3  . promethazine (PHENERGAN) 25 MG tablet Take 1 tablet (25 mg total) by mouth every 6 (six) hours as needed for nausea or vomiting. 30 tablet 3  . Propylene  Glycol (SYSTANE BALANCE) 0.6 % SOLN Apply 1 drop to eye as needed.    . psyllium (METAMUCIL) 58.6 % powder Take 1 packet by mouth as needed.    [name] Spacer/Aero-Holding Chambers (AEROCHAMBER MV) inhaler Use as instructed 1 each 0  . Syringe/Needle, Disp, (SYRINGE 3CC/20GX1") 20G X 1" 3 ML MISC 1 Syringe by Does not apply route every 30 (thirty) days. For use with b-12 injection monthly 50 each 0  . tretinoin (RETIN-A) 0.01 % gel Apply topically at bedtime. 45 g 0  . triamcinolone lotion (KENALOG) 0.1 % Apply 1 application topically daily as needed. 120 mL 1  . urea (CARMOL) 10 % cream Apply topically as needed. 90 g 0  . predniSONE (DELTASONE) 20 MG tablet Take 2 tablets (40 mg total) by mouth daily with breakfast. 10 tablet 0   No facility-administered medications prior to visit.    Review of Systems;  Patient denies headache, fevers, malaise, unintentional weight loss, skin rash, eye pain, sinus congestion and sinus pain, sore throat, dysphagia,  hemoptysis , cough, dyspnea, wheezing, chest pain, palpitations, orthopnea, edema, abdominal pain, nausea, melena, diarrhea, constipation, flank pain, dysuria, hematuria, urinary  Frequency, nocturia, numbness, tingling, seizures,  Focal weakness, Loss of consciousness,  Tremor, insomnia, depression, anxiety, and suicidal ideation.      Objective:  BP 132/80 mmHg  Pulse 91  Temp(Src) 98.1 F (36.7 C) (Oral)  Resp 14  Ht 5\' 6"  (1.676 m)  Wt 187 lb (84.823 kg)  BMI 30.20 kg/m2  SpO2 97%  BP Readings from Last 3 Encounters:  10/26/15 132/80  10/24/15 126/82  10/12/15 138/82    Wt Readings from Last 3 Encounters:  10/26/15 187 lb (84.823 kg)  10/24/15 184 lb 9.6 oz (83.734 kg)  10/12/15 188 lb 3.2 oz (85.367 kg)    General appearance: alert, cooperative and appears stated age Ears: normal TM's and external ear canals both ears Throat: lips, mucosa, and tongue normal; teeth and gums normal Neck: no adenopathy, no carotid bruit, supple,  symmetrical, trachea midline and thyroid not enlarged, symmetric, no tenderness/mass/nodules Back: symmetric, no curvature. ROM normal. No CVA tenderness. Lungs: clear to auscultation bilaterally Heart: regular rate and rhythm, S1, S2 normal, no murmur, click, rub or gallop Abdomen: soft, non-tender; bowel sounds normal; no masses,  no organomegaly Pulses: 2+ and symmetric Skin: Skin color, texture, turgor normal. No rashes or lesions Lymph nodes: Cervical, supraclavicular, and axillary nodes normal.  Lab Results  Component Value Date   HGBA1C 5.9 10/12/2015    Lab Results  Component Value Date   CREATININE 0.86 04/05/2015   CREATININE 0.9  08/17/2014   CREATININE 0.96 04/12/2013    Lab Results  Component Value Date   WBC 7.6 04/05/2015   HGB 13.4 04/05/2015   HCT 40.2 04/05/2015   PLT 300.0 04/05/2015   GLUCOSE 94 04/05/2015   CHOL 174 04/05/2015   TRIG 177.0* 04/05/2015   HDL 54.10 04/05/2015   LDLDIRECT 84.0 04/05/2015   LDLCALC 85 04/05/2015   ALT 20 04/05/2015   AST 16 04/05/2015   NA 138 04/05/2015   K 4.4 04/05/2015   CL 103 04/05/2015   CREATININE 0.86 04/05/2015   BUN 9 04/05/2015   CO2 28 04/05/2015   TSH 2.41 04/05/2015   HGBA1C 5.9 10/12/2015    US Venous Img Lower Unilateral Left  07/12/2015  CLINICAL DATA:  Lower extremity pain for 2 weeks EXAM: Left LOWER EXTREMITY VENOUS DOPPLER ULTRASOUND TECHNIQUE: Gray-scale sonography with graded compression, as well as color Doppler and duplex ultrasound were performed to evaluate the lower extremity deep venous systems from the level of the common femoral vein and including the common femoral, femoral, profunda femoral, popliteal and calf veins including the posterior tibial, peroneal and gastrocnemius veins when visible. The superficial great saphenous vein was also interrogated. Spectral Doppler was utilized to evaluate flow at rest and with distal augmentation maneuvers in the common femoral, femoral and popliteal  veins. COMPARISON:  None. FINDINGS: Contralateral Common Femoral Vein: Respiratory phasicity is normal and symmetric with the symptomatic side. No evidence of thrombus. Normal compressibility. Common Femoral Vein: No evidence of thrombus. Normal compressibility, respiratory phasicity and response to augmentation. Saphenofemoral Junction: No evidence of thrombus. Normal compressibility and flow on color Doppler imaging. Profunda Femoral Vein: No evidence of thrombus. Normal compressibility and flow on color Doppler imaging. Femoral Vein: No evidence of thrombus. Normal compressibility, respiratory phasicity and response to augmentation. Popliteal Vein: No evidence of thrombus. Normal compressibility, respiratory phasicity and response to augmentation. Calf Veins: No evidence of thrombus. Normal compressibility and flow on color Doppler imaging. Superficial Great Saphenous Vein: No evidence of thrombus. Normal compressibility and flow on color Doppler imaging. Venous Reflux:  None. Other Findings:  None. IMPRESSION: No evidence of deep venous thrombosis. Electronically Signed   By: Kerby Moors M.D.   On: 07/12/2015 14:44    Assessment & Plan:   Problem List Items Addressed This Visit    Acute bronchitis - Primary    Not tolerant of current regimen. Starting prednisone taper,  changing abx to augmentin for better coverage of anaerobes given sinus and ear symptoms as well.         A total of 25 minutes of face to face time was spent with patient more than half of which was spent in counselling about the above mentioned conditions  and coordination of care   I have discontinued Ms. Warshaw's predniSONE. I am also having her start on amoxicillin-clavulanate and predniSONE. Additionally, I am having her maintain her aspirin, CENTRUM SILVER, Propylene Glycol, psyllium, Docusate Calcium (STOOL SOFTENER PO), diazepam, ergocalciferol, estradiol, cetirizine, fexofenadine, tretinoin, promethazine, urea,  cyanocobalamin, famotidine, SYRINGE 3CC/20GX1", cycloSPORINE, meclizine, omega-3 acid ethyl esters, Horse Chestnut, furosemide, triamcinolone lotion, montelukast, metoprolol succinate, albuterol, azelastine, Levothyroxine Sodium, AEROCHAMBER MV, conjugated estrogens, beclomethasone, fluticasone, EPINEPHrine, atorvastatin, doxycycline, and benzonatate.  Meds ordered this encounter  Medications  . amoxicillin-clavulanate (AUGMENTIN) 875-125 MG tablet    Sig: Take 1 tablet by mouth 2 (two) times daily.    Dispense:  14 tablet    Refill:  0  . predniSONE (DELTASONE) 10 MG tablet  Sig: 6 tablets on Day 1 , then reduce by 1 tablet daily until gone    Dispense:  21 tablet    Refill:  0    Medications Discontinued During This Encounter  Medication Reason  . predniSONE (DELTASONE) 20 MG tablet     Follow-up: No Follow-up on file.   Crecencio Mc, MD

## 2015-10-26 NOTE — Patient Instructions (Addendum)
Use Delsym twice daily for daytime cough  Use tessalon a maximum of 3 times daily (so save one dose for before bed)   You can Use Afrin nasal spray twice daily if needed for 5 days to manage the congestion and suspend the other two nasal sprays  For the time being  Prednisone upping the to 60 mg  With a taper.  Start asap (earlier in the day is better on your nerves  )  Changing antibiotic to Augmentin twice daily    Please take a probiotic ( Align, Floraque or Culturelle) of the generic version of one of these  For a minimum of 3 weeks to prevent a serious antibiotic associated diarrhea  Called clostridium dificile colitis

## 2015-10-28 NOTE — Assessment & Plan Note (Addendum)
Not tolerant of current regimen. Starting prednisone taper,  changing abx to augmentin for better coverage of anaerobes given sinus and ear symptoms as well.

## 2015-10-30 ENCOUNTER — Telehealth: Payer: Self-pay | Admitting: Pulmonary Disease

## 2015-10-30 ENCOUNTER — Ambulatory Visit (INDEPENDENT_AMBULATORY_CARE_PROVIDER_SITE_OTHER): Payer: Medicare Other | Admitting: Adult Health

## 2015-10-30 ENCOUNTER — Encounter: Payer: Self-pay | Admitting: Adult Health

## 2015-10-30 ENCOUNTER — Ambulatory Visit (INDEPENDENT_AMBULATORY_CARE_PROVIDER_SITE_OTHER)
Admission: RE | Admit: 2015-10-30 | Discharge: 2015-10-30 | Disposition: A | Discharge status: Home / Self Care | Payer: Medicare Other | Source: Ambulatory Visit | Attending: Adult Health | Admitting: Adult Health

## 2015-10-30 VITALS — BP 120/70 | HR 74 | Temp 98.3°F | Ht 65.5 in | Wt 188.8 lb

## 2015-10-30 DIAGNOSIS — R05 Cough: Secondary | ICD-10-CM

## 2015-10-30 DIAGNOSIS — J209 Acute bronchitis, unspecified: Secondary | ICD-10-CM | POA: Diagnosis not present

## 2015-10-30 DIAGNOSIS — R059 Cough, unspecified: Secondary | ICD-10-CM

## 2015-10-30 MED ORDER — AMOXICILLIN-POT CLAVULANATE 875-125 MG PO TABS: 1.0000 | ORAL_TABLET | Freq: Two times a day (BID) | ORAL | Status: AC

## 2015-10-30 MED ORDER — LEVALBUTEROL HCL 0.63 MG/3ML IN NEBU
0.6300 mg | INHALATION_SOLUTION | Freq: Once | RESPIRATORY_TRACT | Status: AC
  Administered 2015-10-30: 0.63 mg via RESPIRATORY_TRACT

## 2015-10-30 NOTE — Telephone Encounter (Signed)
Okay to double book visit. 

## 2015-10-30 NOTE — Telephone Encounter (Signed)
Pt scheduled for OV with TP today at 230 Pt did not want to wait until later this week to be seen.  Nothing further needed.

## 2015-10-30 NOTE — Progress Notes (Signed)
Subjective:    Patient ID: Angela Mendoza, female    DOB: 04-Jan-1951, 65 y.o.   MRN: [id number]  HPI 65 yo female former smoker with allergic rhinitis , urticaria and OSA    10/30/2015 Acute OV :  Pt complains of 1 week of increase SOB, chest cong, prod cough (yellow for the most part w/ tinged green specks), sinus pressure, nasal cong, left ear pain, HA, low grade temp, chills. Pt is having to sleep upright at night due to drainage and cough.  . Pt saw PCP last week and was given augmentin (has 2 days left) and pred taper (will finish tomorrow).  She continues to have cough and congestion despite taking abx.  CXR today shows no acute process w/ chronic interstitial marking   Denies chest pain, orthopnea, edema or fever.   Past Medical History  Diagnosis Date  . HYPERLIPIDEMIA 06/09/2008  . HYPERTENSION 06/09/2008  . ABNORMAL HEART RHYTHMS 06/09/2008  . Chronic headache 06/09/2008  . Allergic rhinitis 06/09/2008    chronic  . Asthma, intrinsic 10/06/2011    controlled on qvar. [name] 09/2011:  Very mild airflow obstruction (FEV1% 68, FVL c/w obstruction)   . Depression   . GERD (gastroesophageal reflux disease)   . Hypothyroidism   . OSA on CPAP     [name]  . Dry eyes   . Surgical menopause 1991  . Vertigo   . Hot flashes   . Diverticulosis 2013    h/o diverticulitis  . History of breast implant removal     silicone mastitis - R axilla silicone LN, free silicone L breast upper outer quadrant  . COPD (chronic obstructive pulmonary disease) ([place])   . MVP (mitral valve prolapse)   . Rosacea   . Vitamin D deficiency   . Pernicious anemia     per prior pcp   Current Outpatient Prescriptions on File Prior to Visit  Medication Sig Dispense Refill  . albuterol (PROVENTIL HFA) 108 (90 BASE) MCG/ACT inhaler Inhale 2 puffs into the lungs every 6 (six) hours as needed. 3 Inhaler 3  . amoxicillin-clavulanate (AUGMENTIN) 875-125 MG tablet Take 1 tablet by mouth 2 (two) times daily. 14  tablet 0  . aspirin 81 MG tablet Take 81 mg by mouth daily.      [name] atorvastatin (LIPITOR) 40 MG tablet Take 1 tablet (40 mg total) by mouth daily. 90 tablet 3  . azelastine (ASTELIN) 0.1 % nasal spray Place 2 sprays into both nostrils 2 (two) times daily. Use in each nostril as directed 90 mL 3  . beclomethasone (QVAR) 80 MCG/ACT inhaler Inhale 2 puffs into the lungs 2 (two) times daily. 3 Inhaler 1  . benzonatate (TESSALON) 200 MG capsule Take 1 capsule (200 mg total) by mouth 3 (three) times daily as needed for cough. 20 capsule 0  . cetirizine (ZYRTEC) 10 MG tablet Take 1 tablet (10 mg total) by mouth daily. (Patient taking differently: Take 10 mg by mouth as needed. ) 90 tablet 3  . conjugated estrogens (PREMARIN) vaginal cream Place 1 Applicatorful vaginally 2 (two) times a week. 90 day supply,  Refill for one year 90 g 3  . cyanocobalamin (,VITAMIN B-12,) 1000 MCG/ML injection Inject 1 mL (1,000 mcg total) into the muscle every 30 (thirty) days. 3 mL 3  . cycloSPORINE (RESTASIS) 0.05 % ophthalmic emulsion Place 1 drop into both eyes 2 (two) times daily. 3 each 3  . diazepam (VALIUM) 5 MG tablet Take 1 tablet (5 mg total)  by mouth every 6 (six) hours as needed (vertigo). 30 tablet 0  . Docusate Calcium (STOOL SOFTENER PO) Take 1 tablet by mouth as needed.    [name] EPINEPHrine 0.3 mg/0.3 mL IJ SOAJ injection Inject 0.3 mLs (0.3 mg total) into the muscle once. 2 Device 0  . estradiol (VIVELLE-DOT) 0.075 MG/24HR Place 1 patch onto the skin 2 (two) times a week. 24 patch 3  . famotidine (PEPCID) 20 MG tablet Take 1 tablet (20 mg total) by mouth 2 (two) times daily. 180 tablet 3  . fexofenadine (ALLEGRA ALLERGY) 180 MG tablet Take 1 tablet (180 mg total) by mouth daily. 90 tablet 3  . fluticasone (FLONASE) 50 MCG/ACT nasal spray Place 2 sprays into both nostrils daily. 48 g 3  . Levothyroxine Sodium 50 MCG CAPS Take 1 capsule (50 mcg total) by mouth daily. 90 capsule 3  . meclizine (ANTIVERT) 25 MG  tablet Take 1 tablet (25 mg total) by mouth 3 (three) times daily as needed. 270 tablet 1  . metoprolol succinate (TOPROL-XL) 50 MG 24 hr tablet Take 1 tablet (50 mg total) by mouth daily. 90 tablet 3  . montelukast (SINGULAIR) 10 MG tablet Take 1 tablet (10 mg total) by mouth at bedtime. 90 tablet 3  . omega-3 acid ethyl esters (LOVAZA) 1 G capsule Take 1 capsule (1 g total) by mouth daily. 90 capsule 3  . predniSONE (DELTASONE) 10 MG tablet 6 tablets on Day 1 , then reduce by 1 tablet daily until gone 21 tablet 0  . promethazine (PHENERGAN) 25 MG tablet Take 1 tablet (25 mg total) by mouth every 6 (six) hours as needed for nausea or vomiting. 30 tablet 3  . Propylene Glycol (SYSTANE BALANCE) 0.6 % SOLN Apply 1 drop to eye as needed.    . psyllium (METAMUCIL) 58.6 % powder Take 1 packet by mouth as needed.    [name] Spacer/Aero-Holding Chambers (AEROCHAMBER MV) inhaler Use as instructed 1 each 0  . Syringe/Needle, Disp, (SYRINGE 3CC/20GX1") 20G X 1" 3 ML MISC 1 Syringe by Does not apply route every 30 (thirty) days. For use with b-12 injection monthly 50 each 0  . tretinoin (RETIN-A) 0.01 % gel Apply topically at bedtime. 45 g 0  . triamcinolone lotion (KENALOG) 0.1 % Apply 1 application topically daily as needed. 120 mL 1  . urea (CARMOL) 10 % cream Apply topically as needed. 90 g 0  . furosemide (LASIX) 20 MG tablet TAKE 1 TABLET (20 MG TOTAL) BY MOUTH DAILY. (Patient not taking: Reported on 10/30/2015) 30 tablet 0  . Horse Chestnut 300 MG CPCR 1 tablet daily for leg pain due to venous insufficiency (Patient not taking: Reported on 10/30/2015) 90 capsule 0  . Multiple Vitamins-Minerals (CENTRUM SILVER) tablet Take 1 tablet by mouth daily. Reported on 10/30/2015     No current facility-administered medications on file prior to visit.      Review of Systems Constitutional:   No  weight loss, night sweats,  Fevers, chills,  +fatigue, or  lassitude.  HEENT:   No headaches,  Difficulty swallowing,   Tooth/dental problems, or  Sore throat,                No sneezing, itching, ear ache,  +nasal congestion, post nasal drip,   CV:  No chest pain,  Orthopnea, PND, swelling in lower extremities, anasarca, dizziness, palpitations, syncope.   GI  No heartburn, indigestion, abdominal pain, nausea, vomiting, diarrhea, change in bowel habits, loss of appetite, bloody  stools.   Resp:  .  No chest wall deformity  Skin: no rash or lesions.  GU: no dysuria, change in color of urine, no urgency or frequency.  No flank pain, no hematuria   MS:  No joint pain or swelling.  No decreased range of motion.  No back pain.  Psych:  No change in mood or affect. No depression or anxiety.  No memory loss.         Objective:   Physical Exam   Filed Vitals:   10/30/15 1428  BP: 120/70  Pulse: 74  Temp: 98.3 F (36.8 C)  TempSrc: Oral  Height: 5' 5.5" (1.664 m)  Weight: 188 lb 12.8 oz (85.639 kg)  SpO2: 97%    GEN: A/Ox3; pleasant , NAD, well nourished   HEENT:  [place]/AT,  EACs-clear, TMs-wnl, NOSE-clear, THROAT-clear, no lesions, no postnasal drip or exudate noted.   NECK:  Supple w/ fair ROM; no JVD; normal carotid impulses w/o bruits; no thyromegaly or nodules palpated; no lymphadenopathy.  RESP  Clear  P & A; w/o, wheezes/ rales/ or rhonchi.no accessory muscle use, no dullness to percussion  CARD:  RRR, no m/r/g  , no peripheral edema, pulses intact, no cyanosis or clubbing.  GI:   Soft & nt; nml bowel sounds; no organomegaly or masses detected.  Musco: Warm bil, no deformities or joint swelling noted.   Neuro: alert, no focal deficits noted.    Skin: Warm, no lesions or rashes  CXR 10/30/15 reviewed independently  Cardiomediastinal silhouette projects within normal limits in size and contour. Chronic interstitial opacities, similar to comparison. No confluent airspace disease, pneumothorax, or pleural effusion.       Assessment & Plan:

## 2015-10-30 NOTE — Patient Instructions (Addendum)
Extend Augmentin for 4 days . Take with food.  Eat yogurt.  Finish prednisone as directed.  Mucinex Twice daily  As needed  Cough/congetion  Saline nasal rinses As needed   Follow up Dr. Halford Chessman next month with PFT.  Please contact office for sooner follow up if symptoms do not improve or worsen or seek emergency care

## 2015-10-30 NOTE — Telephone Encounter (Signed)
Spoke with patient, states that she has had bronchitis since Christmas - seen by PCP twice (12/29-12/30) Pt c/o yellow-green mucus production with cough, SOB, chest tightness, wheezing, left ear pain and sore throat (raw). Having to sleep in upright position. States that she is having an asthma flare on top of everything.  Pt was told that the infection looks like it may be trying to turn into a sinus infection, pt is starting to get congested.  PCP has treated her with Augmentin 875mg  and Prednisone taper (2 pills left until complete), will complete Pred tomorrow and 2 days left Augmentin. Pt is fearing that this may turn into PNA.   Please advise Dr Halford Chessman - there are no available appts in office but we may be able to double book somewhere if need be.

## 2015-11-05 NOTE — Assessment & Plan Note (Signed)
Slow to resolve +/- sinusitis  cxr shows some increased interstitial marking ? Bronchitic in nature  Check PFT on return , if abn, consider repeat CT chest - HRCT , last ct in 2014 ok   Plan  Extend Augmentin for 4 days . Take with food.  Eat yogurt.  Finish prednisone as directed.  Mucinex Twice daily  As needed  Cough/congetion  Saline nasal rinses As needed   Follow up Dr. Halford Chessman next month with PFT.  Please contact office for sooner follow up if symptoms do not improve or worsen or seek emergency care

## 2015-11-06 ENCOUNTER — Other Ambulatory Visit: Payer: Self-pay | Admitting: Surgical

## 2015-11-06 ENCOUNTER — Encounter: Payer: Self-pay | Admitting: Internal Medicine

## 2015-11-06 ENCOUNTER — Ambulatory Visit: Admitting: Pulmonary Disease

## 2015-11-06 MED ORDER — FLUTICASONE PROPIONATE 50 MCG/ACT NA SUSP: 2.0000 | Freq: Every day | NASAL | Status: DC

## 2015-11-06 NOTE — Telephone Encounter (Signed)
Rx for Flonase resent to mail order pharmacy

## 2015-11-20 ENCOUNTER — Encounter: Payer: Self-pay | Admitting: Internal Medicine

## 2015-11-28 ENCOUNTER — Encounter

## 2015-11-29 ENCOUNTER — Encounter: Payer: Self-pay | Admitting: Pulmonary Disease

## 2015-11-29 ENCOUNTER — Ambulatory Visit (INDEPENDENT_AMBULATORY_CARE_PROVIDER_SITE_OTHER): Payer: Medicare Other | Admitting: Pulmonary Disease

## 2015-11-29 VITALS — BP 136/74 | HR 70 | Ht 65.0 in | Wt 186.4 lb

## 2015-11-29 DIAGNOSIS — G4733 Obstructive sleep apnea (adult) (pediatric): Secondary | ICD-10-CM | POA: Diagnosis not present

## 2015-11-29 DIAGNOSIS — J3089 Other allergic rhinitis: Secondary | ICD-10-CM

## 2015-11-29 DIAGNOSIS — J45909 Unspecified asthma, uncomplicated: Secondary | ICD-10-CM

## 2015-11-29 DIAGNOSIS — J309 Allergic rhinitis, unspecified: Secondary | ICD-10-CM | POA: Diagnosis not present

## 2015-11-29 DIAGNOSIS — Z9989 Dependence on other enabling machines and devices: Secondary | ICD-10-CM

## 2015-11-29 DIAGNOSIS — J302 Other seasonal allergic rhinitis: Secondary | ICD-10-CM

## 2015-11-29 MED ORDER — FLUTICASONE PROPIONATE 50 MCG/ACT NA SUSP: 2.0000 | Freq: Every day | NASAL | Status: DC

## 2015-11-29 MED ORDER — AEROCHAMBER MV MISC: Status: DC

## 2015-11-29 NOTE — Progress Notes (Signed)
Current Outpatient Prescriptions on File Prior to Visit  Medication Sig  . albuterol (PROVENTIL HFA) 108 (90 BASE) MCG/ACT inhaler Inhale 2 puffs into the lungs every 6 (six) hours as needed.  [name] aspirin 81 MG tablet Take 81 mg by mouth daily.    [name] atorvastatin (LIPITOR) 40 MG tablet Take 1 tablet (40 mg total) by mouth daily.  [name] azelastine (ASTELIN) 0.1 % nasal spray Place 2 sprays into both nostrils 2 (two) times daily. Use in each nostril as directed  . beclomethasone (QVAR) 80 MCG/ACT inhaler Inhale 2 puffs into the lungs 2 (two) times daily.  . cholecalciferol (VITAMIN D) 1000 units tablet Take 1,000 Units by mouth daily.   [name] conjugated estrogens (PREMARIN) vaginal cream Place 1 Applicatorful vaginally 2 (two) times a week. 90 day supply,  Refill for one year  . cyanocobalamin (,VITAMIN B-12,) 1000 MCG/ML injection Inject 1 mL (1,000 mcg total) into the muscle every 30 (thirty) days.  . cycloSPORINE (RESTASIS) 0.05 % ophthalmic emulsion Place 1 drop into both eyes 2 (two) times daily.  . diazepam (VALIUM) 5 MG tablet Take 1 tablet (5 mg total) by mouth every 6 (six) hours as needed (vertigo).  Mariane Baumgarten Calcium (STOOL SOFTENER PO) Take 1 tablet by mouth as needed.  [name] EPINEPHrine 0.3 mg/0.3 mL IJ SOAJ injection Inject 0.3 mLs (0.3 mg total) into the muscle once.  [name] estradiol (VIVELLE-DOT) 0.075 MG/24HR Place 1 patch onto the skin 2 (two) times a week.  . fexofenadine (ALLEGRA ALLERGY) 180 MG tablet Take 1 tablet (180 mg total) by mouth daily.  . Horse Chestnut 300 MG CPCR 1 tablet daily for leg pain due to venous insufficiency  . Levothyroxine Sodium 50 MCG CAPS Take 1 capsule (50 mcg total) by mouth daily.  . meclizine (ANTIVERT) 25 MG tablet Take 1 tablet (25 mg total) by mouth 3 (three) times daily as needed.  . metoprolol succinate (TOPROL-XL) 50 MG 24 hr tablet Take 1 tablet (50 mg total) by mouth daily.  . montelukast (SINGULAIR) 10 MG tablet Take 1 tablet (10 mg total) by mouth at  bedtime.  [name] omega-3 acid ethyl esters (LOVAZA) 1 G capsule Take 1 capsule (1 g total) by mouth daily.  . promethazine (PHENERGAN) 25 MG tablet Take 1 tablet (25 mg total) by mouth every 6 (six) hours as needed for nausea or vomiting.  [name] Propylene Glycol (SYSTANE BALANCE) 0.6 % SOLN Apply 1 drop to eye as needed.  . psyllium (METAMUCIL) 58.6 % powder Take 1 packet by mouth as needed.  [name] Spacer/Aero-Holding Chambers (AEROCHAMBER MV) inhaler Use as instructed  . Syringe/Needle, Disp, (SYRINGE 3CC/20GX1") 20G X 1" 3 ML MISC 1 Syringe by Does not apply route every 30 (thirty) days. For use with b-12 injection monthly  . tretinoin (RETIN-A) 0.01 % gel Apply topically at bedtime.  . urea (CARMOL) 10 % cream Apply topically as needed.   No current facility-administered medications on file prior to visit.     Chief Complaint  Patient presents with  . Follow-up    former [facility] patient here for 6 month follow up - getting over some bronchitis with some residual cough/throat clearing.  sleep is doing well overall, concerned about mask fit - wearing every night and w/ naps x6-9hrs per day.  would like rx for supplies.     Tests PSG 09/06/07 >> AHI 19 Spirometry 12/12 >> FEV1% 68 RAST 11/15/14 >> IgE 30, cats/mold  Past medical hx HTN, HLD, HA, Depression, GERD, Hypothyroidism,  Vertigo, Diverticulosis, Vit D deficiency, Pernicious anemia  Past surgical hx, Allergies, Family hx, Social hx all reviewed.  Vital Signs BP 136/74 mmHg  Pulse 70  Ht 5\' 5"  (1.651 m)  Wt 186 lb 6.4 oz (84.55 kg)  BMI 31.02 kg/m2  SpO2 99%  History of Present Illness Angela Mendoza is a 65 y.o. female former smoker with OSA and asthma.  She is slowly recovering from her "bronchitis".  She still feels fatigued.  She is not having cough, wheeze, sputum, chest pain, or fever.  Her sinuses are better.  She mostly uses allergra, but sometimes uses zyrtec.    She is using CPAP nightly.  She changed to nasal mask.  She has  noticed more mouth dryness, and sometimes feels like she isn't getting enough pressure.  Physical Exam  General - No distress ENT - No sinus tenderness, no oral exudate, no LAN Cardiac - s1s2 regular, no murmur Chest - No wheeze/rales/dullness Back - No focal tenderness Abd - Soft, non-tender Ext - No edema Neuro - Normal strength Skin - No rashes Psych - normal mood, and behavior   Dg Chest 2 View  10/30/2015  CLINICAL DATA:  65 year old female with a history of cough and congestion. EXAM: CHEST - 2 VIEW COMPARISON:  12/12/2014, 12/22/2012, CT chest 10/29/2012 FINDINGS: Cardiomediastinal silhouette projects within normal limits in size and contour. Chronic interstitial opacities, similar to comparison. No confluent airspace disease, pneumothorax, or pleural effusion. No displaced fracture. Unremarkable appearance of the upper abdomen. IMPRESSION: Chronic lung changes without evidence of acute cardiopulmonary disease. Signed, Dulcy Fanny. Earleen Newport, DO [facility] Electronically Signed   By: Corrie Mckusick D.O.   On: 10/30/2015 15:28    Assessment/Plan  Probable viral pneumonia >> recovering. Plan: - will re-arrange PFT's - she will need CXR at next visit  Obstructive sleep apnea. She is compliant, and reports benefit. Plan: - continue CPAP 13 cm H2O - will arrange for new supplies, and chin strap  Asthma. Plan: - continue Qvar, singulair  Allergic rhinitis. Plan: - astelin, zyrtec, allegra, flonase, singulair   Patient Instructions  Will re-arrange for pulmonary function test to be done in March 2017 Will arrange for new CPAP supplies and chin strap Will arrange for new spacer device Follow up in April 2017     Chesley Mires, MD [place] [facility] Pager:  [phone]

## 2015-11-29 NOTE — Patient Instructions (Addendum)
Will re-arrange for pulmonary function test to be done in March 2017 Will arrange for new CPAP supplies and chin strap Will arrange for new spacer device Follow up in April 2017

## 2015-12-23 ENCOUNTER — Encounter: Payer: Self-pay | Admitting: Internal Medicine

## 2015-12-24 ENCOUNTER — Other Ambulatory Visit: Payer: Self-pay | Admitting: Internal Medicine

## 2015-12-24 NOTE — Telephone Encounter (Signed)
Ok to refill Valium? Last OV 12/16.

## 2015-12-25 ENCOUNTER — Encounter: Payer: Self-pay | Admitting: Pulmonary Disease

## 2015-12-25 ENCOUNTER — Encounter: Payer: Self-pay | Admitting: Internal Medicine

## 2015-12-25 NOTE — Telephone Encounter (Signed)
refilled 

## 2015-12-27 ENCOUNTER — Ambulatory Visit (INDEPENDENT_AMBULATORY_CARE_PROVIDER_SITE_OTHER): Payer: Medicare Other

## 2015-12-27 DIAGNOSIS — Z23 Encounter for immunization: Secondary | ICD-10-CM | POA: Diagnosis not present

## 2015-12-27 DIAGNOSIS — Z299 Encounter for prophylactic measures, unspecified: Secondary | ICD-10-CM

## 2015-12-27 DIAGNOSIS — Z0000 Encounter for general adult medical examination without abnormal findings: Secondary | ICD-10-CM

## 2015-12-27 NOTE — Progress Notes (Signed)
Patient came in and got a Pneumovax 23. Patient received in her right deltoid. Patient tolerated well.

## 2015-12-28 ENCOUNTER — Encounter: Payer: Self-pay | Admitting: Nurse Practitioner

## 2015-12-28 ENCOUNTER — Ambulatory Visit (INDEPENDENT_AMBULATORY_CARE_PROVIDER_SITE_OTHER): Payer: Medicare Other | Admitting: Nurse Practitioner

## 2015-12-28 ENCOUNTER — Telehealth: Payer: Self-pay

## 2015-12-28 VITALS — BP 112/88 | HR 91 | Temp 97.9°F | Ht 65.0 in | Wt 184.2 lb

## 2015-12-28 DIAGNOSIS — T50A95A Adverse effect of other bacterial vaccines, initial encounter: Secondary | ICD-10-CM

## 2015-12-28 MED ORDER — DOXYCYCLINE HYCLATE 100 MG PO TABS: 100.0000 mg | ORAL_TABLET | Freq: Two times a day (BID) | ORAL | Status: DC

## 2015-12-28 NOTE — Patient Instructions (Signed)

## 2015-12-28 NOTE — Telephone Encounter (Signed)
NO FURTHER ACTION REQUIRED

## 2015-12-28 NOTE — Progress Notes (Signed)
Patient ID: Angela Mendoza, female    DOB: 1951/01/30  Age: 65 y.o. MRN: [id number]  CC: Acute Visit   HPI Angela Mendoza presents for CC of pna-23 vaccine reaction.   1) Patient received the Pneumovax 23 vaccine yesterday.  She returns today for a reaction to the injection  Welp, red, painful, warm Growing in size, started yesterday as a small area then progressed into a Bull's Eye pattern, solid and indurated today, pt has marked from after her shower this morning  Taking probiotics daily  Denies tongue swelling, trouble breathing/swallowing, or syncope  History Angela Mendoza has a past medical history of HYPERLIPIDEMIA (06/09/2008); HYPERTENSION (06/09/2008); ABNORMAL HEART RHYTHMS (06/09/2008); Chronic headache (06/09/2008); Allergic rhinitis (06/09/2008); Asthma, intrinsic (10/06/2011); Depression; GERD (gastroesophageal reflux disease); Hypothyroidism; OSA on CPAP; Dry eyes; Surgical menopause (1991); Vertigo; Hot flashes; Diverticulosis (2013); History of breast implant removal; COPD (chronic obstructive pulmonary disease) (HCC); MVP (mitral valve prolapse); Rosacea; Vitamin D deficiency; and Pernicious anemia.   She has past surgical history that includes Cholecystectomy (2007); Appendectomy (2007); Tubal ligation; Total abdominal hysterectomy (1991); Nasal sinus surgery; Breast enhancement surgery (2006); Cardiovascular stress test (2013); Colonoscopy (08/2012); Esophagogastroduodenoscopy (08/2012); US ECHOCARDIOGRAPHY (09/2009); US ECHOCARDIOGRAPHY (11/2007); sleep study (08/2007); Rectocele repair (12/2008); and Colonoscopy (2006).   Her family history includes Alzheimer's disease in her paternal aunt; Aortic dissection in her maternal aunt and paternal uncle; Cancer in her cousin and paternal aunt; Cancer (age of onset: 23) in her father; Cancer (age of onset: 27) in her mother; Heart failure in her sister; Hypertension in her sister; Stroke in her sister.She reports that she quit smoking about 42  years ago. Her smoking use included Cigarettes. She has a 2.1 pack-year smoking history. She has never used smokeless tobacco. She reports that she drinks alcohol. She reports that she does not use illicit drugs.  Outpatient Prescriptions Prior to Visit  Medication Sig Dispense Refill  . albuterol (PROVENTIL HFA) 108 (90 BASE) MCG/ACT inhaler Inhale 2 puffs into the lungs every 6 (six) hours as needed. 3 Inhaler 3  . aspirin 81 MG tablet Take 81 mg by mouth daily.      [name] atorvastatin (LIPITOR) 40 MG tablet Take 1 tablet (40 mg total) by mouth daily. 90 tablet 3  . azelastine (ASTELIN) 0.1 % nasal spray Place 2 sprays into both nostrils 2 (two) times daily. Use in each nostril as directed 90 mL 3  . B Complex Vitamins (VITAMIN-B COMPLEX) TABS Take 1 tablet by mouth daily.    . beclomethasone (QVAR) 80 MCG/ACT inhaler Inhale 2 puffs into the lungs 2 (two) times daily. 3 Inhaler 1  . cetirizine (ZYRTEC) 10 MG tablet Take 10 mg by mouth daily as needed for allergies.    . cholecalciferol (VITAMIN D) 1000 units tablet Take 1,000 Units by mouth daily.     [name] conjugated estrogens (PREMARIN) vaginal cream Place 1 Applicatorful vaginally 2 (two) times a week. 90 day supply,  Refill for one year 90 g 3  . cyanocobalamin (,VITAMIN B-12,) 1000 MCG/ML injection Inject 1 mL (1,000 mcg total) into the muscle every 30 (thirty) days. 3 mL 3  . Cyanocobalamin (B-12 COMPLIANCE INJECTION) 1000 MCG/ML KIT Inject 1,000 mcg into the muscle every 28 (twenty-eight) days.    . cycloSPORINE (RESTASIS) 0.05 % ophthalmic emulsion Place 1 drop into both eyes 2 (two) times daily. 3 each 3  . diazepam (VALIUM) 5 MG tablet TAKE 1 TABLET BY MOUTH EVERY 6 HOURS AS NEEDED FOR VERTIGO 30 tablet  1  . Docusate Calcium (STOOL SOFTENER PO) Take 1 tablet by mouth as needed.    [name] EPINEPHrine 0.3 mg/0.3 mL IJ SOAJ injection Inject 0.3 mLs (0.3 mg total) into the muscle once. 2 Device 0  . estradiol (VIVELLE-DOT) 0.075 MG/24HR Place 1 patch  onto the skin 2 (two) times a week. (Patient taking differently: Place 1 patch onto the skin 2 (two) times a week. ) 24 patch 3  . famotidine (PEPCID) 20 MG tablet Take 20 mg by mouth daily as needed for heartburn or indigestion.    . fexofenadine (ALLEGRA ALLERGY) 180 MG tablet Take 1 tablet (180 mg total) by mouth daily. 90 tablet 3  . fluticasone (FLONASE) 50 MCG/ACT nasal spray Place 2 sprays into both nostrils daily. 48 g 3  . Horse Chestnut 300 MG CPCR 1 tablet daily for leg pain due to venous insufficiency 90 capsule 0  . Levothyroxine Sodium 50 MCG CAPS Take 1 capsule (50 mcg total) by mouth daily. 90 capsule 3  . meclizine (ANTIVERT) 25 MG tablet Take 1 tablet (25 mg total) by mouth 3 (three) times daily as needed. 270 tablet 1  . metoprolol succinate (TOPROL-XL) 50 MG 24 hr tablet Take 1 tablet (50 mg total) by mouth daily. 90 tablet 3  . montelukast (SINGULAIR) 10 MG tablet Take 1 tablet (10 mg total) by mouth at bedtime. 90 tablet 3  . omega-3 acid ethyl esters (LOVAZA) 1 G capsule Take 1 capsule (1 g total) by mouth daily. 90 capsule 3  . promethazine (PHENERGAN) 25 MG tablet Take 1 tablet (25 mg total) by mouth every 6 (six) hours as needed for nausea or vomiting. 30 tablet 3  . Propylene Glycol (SYSTANE BALANCE) 0.6 % SOLN Apply 1 drop to eye as needed.    . psyllium (METAMUCIL) 58.6 % powder Take 1 packet by mouth as needed.    . Psyllium 48.57 % POWD Take by mouth as needed.    [name] Spacer/Aero-Holding Chambers (AEROCHAMBER MV) inhaler Use as instructed 1 each 0  . Syringe/Needle, Disp, (SYRINGE 3CC/20GX1") 20G X 1" 3 ML MISC 1 Syringe by Does not apply route every 30 (thirty) days. For use with b-12 injection monthly 50 each 0  . tretinoin (RETIN-A) 0.01 % gel Apply topically at bedtime. 45 g 0  . urea (CARMOL) 10 % cream Apply topically as needed. 90 g 0  . estradiol (VIVELLE-DOT) 0.075 MG/24HR Reported on 12/28/2015     No facility-administered medications prior to visit.     ROS Review of Systems  Constitutional: Negative for fever, chills, diaphoresis and fatigue.  Musculoskeletal: Positive for myalgias. Negative for joint swelling and arthralgias.  Skin: Positive for color change. Negative for pallor, rash and wound.    Objective:  BP 112/88 mmHg  Pulse 91  Temp(Src) 97.9 F (36.6 C) (Oral)  Ht '5\' 5"'$  (1.651 m)  Wt 184 lb 4 oz (83.575 kg)  BMI 30.66 kg/m2  SpO2 95%  Physical Exam  Constitutional: She is oriented to person, place, and time. She appears well-developed and well-nourished. No distress.  HENT:  Head: Normocephalic and atraumatic.  Right Ear: External ear normal.  Left Ear: External ear normal.  Mouth/Throat: Oropharynx is clear and moist. No oropharyngeal exudate.  Cardiovascular: Normal rate, regular rhythm and normal heart sounds.  Exam reveals no gallop and no friction rub.   No murmur heard. Pulmonary/Chest: Effort normal and breath sounds normal. No respiratory distress. She has no wheezes. She has no rales. She exhibits  no tenderness.  Neurological: She is alert and oriented to person, place, and time. She exhibits normal muscle tone. Coordination normal.  Skin: Skin is warm and dry. No rash noted. She is not diaphoretic. There is erythema.     Warm, indurated, tender to palpation, large several inch area that is wrapping around to her posterior arm, only on upper R arm  Psychiatric: She has a normal mood and affect. Her behavior is normal. Judgment and thought content normal.   Assessment & Plan:   Angela Mendoza was seen today for acute visit.  Diagnoses and all orders for this visit:  Reaction to Pneumovax immunization, initial encounter  Other orders -     doxycycline (VIBRA-TABS) 100 MG tablet; Take 1 tablet (100 mg total) by mouth 2 (two) times daily.  I am having Angela Mendoza start on doxycycline. I am also having her maintain her aspirin, Propylene Glycol, psyllium, Docusate Calcium (STOOL SOFTENER PO), estradiol,  fexofenadine, tretinoin, promethazine, urea, cyanocobalamin, SYRINGE 3CC/20GX1", cycloSPORINE, meclizine, omega-3 acid ethyl esters, Horse Chestnut, montelukast, metoprolol succinate, albuterol, azelastine, Levothyroxine Sodium, conjugated estrogens, beclomethasone, EPINEPHrine, atorvastatin, cholecalciferol, cetirizine, famotidine, Vitamin-B Complex, Cyanocobalamin, estradiol, Psyllium, fluticasone, AEROCHAMBER MV, and diazepam.  Meds ordered this encounter  Medications  . doxycycline (VIBRA-TABS) 100 MG tablet    Sig: Take 1 tablet (100 mg total) by mouth 2 (two) times daily.    Dispense:  10 tablet    Refill:  0    Order Specific Question:  Supervising Provider    Answer:  Crecencio Mc [[id number]]     Follow-up: Return if symptoms worsen or fail to improve.

## 2015-12-28 NOTE — Telephone Encounter (Signed)
Received a note from [facility] that patient received her Pneumonia 23 shot yesterday afternoon and last night it was raised at the site, hard and hot to touch.  Denied fevers and  Had been applying cool packs to the site.    I called this AM to check on patient, she said the area is approximately 5inch by 5 inches.  It is reddish, the swelling has gone down a little, she states it is sore but she can move it.  It is still hot to touch, denies fevers.  Remains putting cool cloth to site.  She feels it is better then yesterday. I advised her that if she runs a fever or the swelling gets worse to call us back.  Please advise?

## 2015-12-28 NOTE — Progress Notes (Signed)
Pre visit review using our clinic review tool, if applicable. No additional management support is needed unless otherwise documented below in the visit note. 

## 2015-12-30 DIAGNOSIS — T50A95A Adverse effect of other bacterial vaccines, initial encounter: Secondary | ICD-10-CM | POA: Insufficient documentation

## 2015-12-30 NOTE — Assessment & Plan Note (Signed)
New onset Treating for cellulitis There is some improvement within 24 hrs  Doxycyline twice daily for 5 days given with strong encouragement to continue daily probiotics Dr. Derrel Nip did consult on this patient (per pt request for her to see the photo and compare to current state).

## 2016-01-18 ENCOUNTER — Ambulatory Visit
Admission: RE | Admit: 2016-01-18 | Discharge: 2016-01-18 | Disposition: A | Discharge status: Home / Self Care | Payer: Medicare Other | Source: Ambulatory Visit | Attending: Vascular Surgery | Admitting: Vascular Surgery

## 2016-01-18 DIAGNOSIS — Z1231 Encounter for screening mammogram for malignant neoplasm of breast: Secondary | ICD-10-CM

## 2016-01-21 ENCOUNTER — Other Ambulatory Visit: Payer: Self-pay | Admitting: Vascular Surgery

## 2016-01-21 DIAGNOSIS — N63 Unspecified lump in unspecified breast: Secondary | ICD-10-CM

## 2016-01-21 DIAGNOSIS — N644 Mastodynia: Secondary | ICD-10-CM

## 2016-01-29 ENCOUNTER — Ambulatory Visit
Admission: RE | Admit: 2016-01-29 | Discharge: 2016-01-29 | Disposition: A | Discharge status: Home / Self Care | Payer: Medicare Other | Source: Ambulatory Visit | Attending: Vascular Surgery | Admitting: Vascular Surgery

## 2016-01-29 DIAGNOSIS — N63 Unspecified lump in unspecified breast: Secondary | ICD-10-CM

## 2016-01-29 DIAGNOSIS — N644 Mastodynia: Secondary | ICD-10-CM

## 2016-01-29 DIAGNOSIS — R922 Inconclusive mammogram: Secondary | ICD-10-CM | POA: Diagnosis not present

## 2016-02-04 DIAGNOSIS — N6012 Diffuse cystic mastopathy of left breast: Secondary | ICD-10-CM | POA: Diagnosis not present

## 2016-02-04 DIAGNOSIS — Z6830 Body mass index (BMI) 30.0-30.9, adult: Secondary | ICD-10-CM | POA: Diagnosis not present

## 2016-02-04 DIAGNOSIS — N6011 Diffuse cystic mastopathy of right breast: Secondary | ICD-10-CM | POA: Diagnosis not present

## 2016-02-04 DIAGNOSIS — E669 Obesity, unspecified: Secondary | ICD-10-CM | POA: Diagnosis not present

## 2016-02-06 ENCOUNTER — Other Ambulatory Visit: Payer: Self-pay | Admitting: Pulmonary Disease

## 2016-02-06 ENCOUNTER — Ambulatory Visit: Payer: Medicare Other | Admitting: Pulmonary Disease

## 2016-02-06 DIAGNOSIS — J45909 Unspecified asthma, uncomplicated: Secondary | ICD-10-CM

## 2016-02-07 ENCOUNTER — Ambulatory Visit (INDEPENDENT_AMBULATORY_CARE_PROVIDER_SITE_OTHER): Payer: Medicare Other | Admitting: *Deleted

## 2016-02-07 DIAGNOSIS — J45909 Unspecified asthma, uncomplicated: Secondary | ICD-10-CM | POA: Diagnosis not present

## 2016-02-07 LAB — PULMONARY FUNCTION TEST
DL/VA % pred: 87 %
DL/VA: 4.33 ml/min/mmHg/L
DLCO unc % pred: 121 %
DLCO unc: 31.04 ml/min/mmHg
FEF 25-75 Post: 1.95 L/sec
FEF 25-75 Pre: 1.56 L/sec
FEF2575-%Change-Post: 25 %
FEF2575-%Pred-Post: 89 %
FEF2575-%Pred-Pre: 71 %
FEV1-%Change-Post: 5 %
FEV1-%Pred-Post: 92 %
FEV1-%Pred-Pre: 87 %
FEV1-Post: 2.32 L
FEV1-Pre: 2.19 L
FEV1FVC-%Change-Post: 3 %
FEV1FVC-%Pred-Pre: 95 %
FEV6-%Change-Post: 2 %
FEV6-%Pred-Post: 96 %
FEV6-%Pred-Pre: 94 %
FEV6-Post: 3.04 L
FEV6-Pre: 2.98 L
FEV6FVC-%Pred-Post: 104 %
FEV6FVC-%Pred-Pre: 104 %
FVC-%Change-Post: 2 %
FVC-%Pred-Post: 92 %
FVC-%Pred-Pre: 90 %
FVC-Post: 3.04 L
FVC-Pre: 2.98 L
Post FEV1/FVC ratio: 76 %
Post FEV6/FVC ratio: 100 %
Pre FEV1/FVC ratio: 74 %
Pre FEV6/FVC Ratio: 100 %

## 2016-02-07 NOTE — Progress Notes (Signed)
PFT performed today with Nitrogen washout. 

## 2016-02-13 ENCOUNTER — Other Ambulatory Visit: Payer: Self-pay | Admitting: Vascular Surgery

## 2016-02-13 ENCOUNTER — Encounter: Payer: Self-pay | Admitting: Pulmonary Disease

## 2016-02-13 ENCOUNTER — Ambulatory Visit (INDEPENDENT_AMBULATORY_CARE_PROVIDER_SITE_OTHER): Payer: Medicare Other | Admitting: Pulmonary Disease

## 2016-02-13 VITALS — BP 132/84 | HR 77 | Ht 65.5 in | Wt 182.8 lb

## 2016-02-13 DIAGNOSIS — J45909 Unspecified asthma, uncomplicated: Secondary | ICD-10-CM

## 2016-02-13 DIAGNOSIS — G4733 Obstructive sleep apnea (adult) (pediatric): Secondary | ICD-10-CM | POA: Diagnosis not present

## 2016-02-13 DIAGNOSIS — Z9989 Dependence on other enabling machines and devices: Secondary | ICD-10-CM

## 2016-02-13 DIAGNOSIS — Z1231 Encounter for screening mammogram for malignant neoplasm of breast: Secondary | ICD-10-CM

## 2016-02-13 MED ORDER — AZELASTINE-FLUTICASONE 137-50 MCG/ACT NA SUSP: 1.0000 | Freq: Two times a day (BID) | NASAL | Status: DC

## 2016-02-13 NOTE — Progress Notes (Signed)
Current Outpatient Prescriptions on File Prior to Visit  Medication Sig  . albuterol (PROVENTIL HFA) 108 (90 BASE) MCG/ACT inhaler Inhale 2 puffs into the lungs every 6 (six) hours as needed.  [name] aspirin 81 MG tablet Take 81 mg by mouth daily.    [name] atorvastatin (LIPITOR) 40 MG tablet Take 1 tablet (40 mg total) by mouth daily.  [name] azelastine (ASTELIN) 0.1 % nasal spray Place 2 sprays into both nostrils 2 (two) times daily. Use in each nostril as directed  . B Complex Vitamins (VITAMIN-B COMPLEX) TABS Take 1 tablet by mouth daily.  . beclomethasone (QVAR) 80 MCG/ACT inhaler Inhale 2 puffs into the lungs 2 (two) times daily.  . cetirizine (ZYRTEC) 10 MG tablet Take 10 mg by mouth daily as needed for allergies.  . cholecalciferol (VITAMIN D) 1000 units tablet Take 1,000 Units by mouth daily.   [name] conjugated estrogens (PREMARIN) vaginal cream Place 1 Applicatorful vaginally 2 (two) times a week. 90 day supply,  Refill for one year  . cyanocobalamin (,VITAMIN B-12,) 1000 MCG/ML injection Inject 1 mL (1,000 mcg total) into the muscle every 30 (thirty) days.  . cycloSPORINE (RESTASIS) 0.05 % ophthalmic emulsion Place 1 drop into both eyes 2 (two) times daily.  . diazepam (VALIUM) 5 MG tablet TAKE 1 TABLET BY MOUTH EVERY 6 HOURS AS NEEDED FOR VERTIGO  . Docusate Calcium (STOOL SOFTENER PO) Take 1 tablet by mouth as needed.  [name] EPINEPHrine 0.3 mg/0.3 mL IJ SOAJ injection Inject 0.3 mLs (0.3 mg total) into the muscle once.  [name] estradiol (VIVELLE-DOT) 0.075 MG/24HR Place 1 patch onto the skin 2 (two) times a week. (Patient taking differently: Place 1 patch onto the skin 2 (two) times a week. )  . estradiol (VIVELLE-DOT) 0.075 MG/24HR Reported on 12/28/2015  . famotidine (PEPCID) 20 MG tablet Take 20 mg by mouth daily as needed for heartburn or indigestion.  . fexofenadine (ALLEGRA ALLERGY) 180 MG tablet Take 1 tablet (180 mg total) by mouth daily.  . fluticasone (FLONASE) 50 MCG/ACT nasal spray Place 2 sprays into  both nostrils daily.  . Levothyroxine Sodium 50 MCG CAPS Take 1 capsule (50 mcg total) by mouth daily.  . meclizine (ANTIVERT) 25 MG tablet Take 1 tablet (25 mg total) by mouth 3 (three) times daily as needed.  . metoprolol succinate (TOPROL-XL) 50 MG 24 hr tablet Take 1 tablet (50 mg total) by mouth daily.  . montelukast (SINGULAIR) 10 MG tablet Take 1 tablet (10 mg total) by mouth at bedtime.  [name] omega-3 acid ethyl esters (LOVAZA) 1 G capsule Take 1 capsule (1 g total) by mouth daily.  . promethazine (PHENERGAN) 25 MG tablet Take 1 tablet (25 mg total) by mouth every 6 (six) hours as needed for nausea or vomiting.  [name] Propylene Glycol (SYSTANE BALANCE) 0.6 % SOLN Apply 1 drop to eye as needed.  . psyllium (METAMUCIL) 58.6 % powder Take 1 packet by mouth as needed.  [name] Spacer/Aero-Holding Chambers (AEROCHAMBER MV) inhaler Use as instructed  . Syringe/Needle, Disp, (SYRINGE 3CC/20GX1") 20G X 1" 3 ML MISC 1 Syringe by Does not apply route every 30 (thirty) days. For use with b-12 injection monthly  . tretinoin (RETIN-A) 0.01 % gel Apply topically at bedtime.  . urea (CARMOL) 10 % cream Apply topically as needed.   No current facility-administered medications on file prior to visit.     Chief Complaint  Patient presents with  . Follow-up    PFT results. no problems w/ cpap. pressure &  mask are ok.      Tests PSG 09/06/07 >> AHI 19 Spirometry 12/12 >> FEV1% 68 RAST 11/15/14 >> IgE 30, cats/mold CPAP 01/13/16 to 02/11/16 >> used on 30 of 30 nights with average 8 hrs 1 min.  Average AHI 0.5 with CPAP 13 cm H2O PFT 02/07/16 >> FEV1 2.32 (92%), FEV1% 76, FEF 25-75% 1.56 (71%), TLC 4.25 (81%), DLCO 121, borderline BD from FEF 25-75   Past medical hx HTN, HLD, HA, Depression, GERD, Hypothyroidism, Vertigo, Diverticulosis, Vit D deficiency, Pernicious anemia  Past surgical hx, Allergies, Family hx, Social hx all reviewed.  Vital Signs BP 132/84 mmHg  Pulse 77  Ht 5' 5.5" (1.664 m)  Wt 182 lb  12.8 oz (82.918 kg)  BMI 29.95 kg/m2  SpO2 98%  History of Present Illness Angela Mendoza is a 65 y.o. female former smoker with OSA and asthma.  She is here to review her PFT.  This showed small airway obstruction with some reversibility with bronchodilator challenge.  Her breathing continues to improve slowly.  She still gets tired easily.  She feels like she is able to take a deeper breath.  She is not having cough, wheeze, chest pain, fever, or sputum.  She continues to use astelin, Qvar, zyrtec or allegra, flonase, singulair.  She still gets runny nose and nasal congestion.  She was told by a friend that dymista works better than astelin and flonase separately.  She is doing well with CPAP.  This is helping her sleep.  Physical Exam  General - No distress ENT - No sinus tenderness, no oral exudate, no LAN Cardiac - s1s2 regular, no murmur Chest - No wheeze/rales/dullness Back - No focal tenderness Abd - Soft, non-tender Ext - No edema Neuro - Normal strength Skin - No rashes Psych - normal mood, and behavior   Mm Diag Breast Tomo Bilateral  01/29/2016  CLINICAL DATA:  Patient with diffuse left breast pain. History of bilateral silicone implants, which have been removed. History of benign biopsy on the left. EXAM: 2D DIGITAL DIAGNOSTIC BILATERAL MAMMOGRAM WITH CAD AND ADJUNCT TOMO COMPARISON:  Previous exam(s). ACR Breast Density Category c: The breast tissue is heterogeneously dense, which may obscure small masses. FINDINGS: Architectural distortion in the deep breast consistent with scarring related to the old implants is stable. There are no other areas of architectural distortion. There are no new or suspicious masses and no suspicious calcifications. No mammographic change. Mammographic images were processed with CAD. IMPRESSION: No evidence of malignancy.  Benign areas of scarring, stable. RECOMMENDATION: Screening mammogram in one year.(Code:[id number]) I have discussed the  findings and recommendations with the patient. Results were also provided in writing at the conclusion of the visit. If applicable, a reminder letter will be sent to the patient regarding the next appointment. BI-RADS CATEGORY  2: Benign. Electronically Signed   By: Lajean Manes M.D.   On: 01/29/2016 12:41    Assessment/Plan  Obstructive sleep apnea. She is compliant, and reports benefit. Plan: - continue CPAP 13 cm H2O  Asthma. Plan: - continue Qvar, singulair  Allergic rhinitis. Plan: - continue singulair and OTC anti-histamine pill - will send script for dymista >> explained that insurance might not cover this, and she might need to continue flonase and astelin    Patient Instructions  Follow up in 6 months     Chesley Mires, MD [facility] Pager:  [phone] 02/13/2016

## 2016-02-13 NOTE — Patient Instructions (Signed)
Follow up in 6 months 

## 2016-05-12 DIAGNOSIS — D485 Neoplasm of uncertain behavior of skin: Secondary | ICD-10-CM | POA: Diagnosis not present

## 2016-05-12 DIAGNOSIS — L820 Inflamed seborrheic keratosis: Secondary | ICD-10-CM | POA: Diagnosis not present

## 2016-06-13 ENCOUNTER — Telehealth: Payer: Self-pay | Admitting: *Deleted

## 2016-06-13 NOTE — Telephone Encounter (Signed)
Patient has been informed on taking her epi pen for disposal.  Also she has scheduled appointment with margaret arnett [phone].

## 2016-06-13 NOTE — Telephone Encounter (Signed)
Patient called and states she needs to see Dr. Derrel Nip for sinus issues and also she has been complaining that her ears fills like they are clogged and they pop a lot. Dr. Derrel Nip schedule is full. She prefer to see Dr. Derrel Nip. She also states she has  an Epi pen that is expired and she wants to know how to dispose of it. Patient is requesting a call back. Her number is   [phone]. Thanks

## 2016-06-23 ENCOUNTER — Ambulatory Visit (INDEPENDENT_AMBULATORY_CARE_PROVIDER_SITE_OTHER): Payer: Medicare Other | Admitting: Family

## 2016-06-23 ENCOUNTER — Encounter: Payer: Self-pay | Admitting: Family

## 2016-06-23 ENCOUNTER — Other Ambulatory Visit: Payer: Self-pay

## 2016-06-23 VITALS — BP 140/80 | HR 61 | Temp 98.0°F | Ht 65.5 in | Wt 181.4 lb

## 2016-06-23 DIAGNOSIS — R0981 Nasal congestion: Secondary | ICD-10-CM

## 2016-06-23 MED ORDER — OMEGA-3-ACID ETHYL ESTERS 1 G PO CAPS: 1.0000 g | ORAL_CAPSULE | Freq: Every day | ORAL | 3 refills | Status: DC

## 2016-06-23 MED ORDER — METOPROLOL SUCCINATE ER 50 MG PO TB24: 50.0000 mg | ORAL_TABLET | Freq: Every day | ORAL | 3 refills | Status: DC

## 2016-06-23 MED ORDER — CYCLOSPORINE 0.05 % OP EMUL: 1.0000 [drp] | Freq: Two times a day (BID) | OPHTHALMIC | 3 refills | Status: DC

## 2016-06-23 MED ORDER — AZELASTINE-FLUTICASONE 137-50 MCG/ACT NA SUSP
1.0000 | Freq: Two times a day (BID) | NASAL | 3 refills | Status: DC
Start: 2016-06-23 — End: 2018-02-11

## 2016-06-23 MED ORDER — ATORVASTATIN CALCIUM 40 MG PO TABS: 40.0000 mg | ORAL_TABLET | Freq: Every day | ORAL | 3 refills | Status: DC

## 2016-06-23 MED ORDER — MECLIZINE HCL 25 MG PO TABS: 25.0000 mg | ORAL_TABLET | Freq: Three times a day (TID) | ORAL | 1 refills | Status: DC | PRN

## 2016-06-23 MED ORDER — ALBUTEROL SULFATE HFA 108 (90 BASE) MCG/ACT IN AERS: 2.0000 | INHALATION_SPRAY | Freq: Four times a day (QID) | RESPIRATORY_TRACT | 3 refills | Status: DC | PRN

## 2016-06-23 MED ORDER — CYANOCOBALAMIN 1000 MCG/ML IJ SOLN: 1000.0000 ug | INTRAMUSCULAR | 3 refills | Status: DC

## 2016-06-23 MED ORDER — PROMETHAZINE HCL 25 MG PO TABS: 25.0000 mg | ORAL_TABLET | Freq: Four times a day (QID) | ORAL | 3 refills | Status: DC | PRN

## 2016-06-23 MED ORDER — "SYRINGE 20G X 1"" 3 ML MISC": 1.0000 | 0 refills | Status: DC

## 2016-06-23 MED ORDER — MONTELUKAST SODIUM 10 MG PO TABS: 10.0000 mg | ORAL_TABLET | Freq: Every day | ORAL | 3 refills | Status: DC

## 2016-06-23 MED ORDER — BECLOMETHASONE DIPROPIONATE 80 MCG/ACT IN AERS: 2.0000 | INHALATION_SPRAY | Freq: Two times a day (BID) | RESPIRATORY_TRACT | 1 refills | Status: DC

## 2016-06-23 MED ORDER — ESTROGENS, CONJUGATED 0.625 MG/GM VA CREA: 1.0000 | TOPICAL_CREAM | VAGINAL | 3 refills | Status: DC

## 2016-06-23 MED ORDER — LEVOTHYROXINE SODIUM 50 MCG PO CAPS: 50.0000 ug | ORAL_CAPSULE | Freq: Every day | ORAL | 3 refills | Status: DC

## 2016-06-23 MED ORDER — AMOXICILLIN-POT CLAVULANATE ER 1000-62.5 MG PO TB12: 2.0000 | ORAL_TABLET | Freq: Two times a day (BID) | ORAL | 0 refills | Status: DC

## 2016-06-23 NOTE — Patient Instructions (Addendum)
I suspect that your infection is viral in nature.  As discussed, I advise that you wait to fill the antibiotic after 1-2 days of symptom management to see if your symptoms improve. If you do not show improvement, you may take the antibiotic as prescribed.  Increase intake of clear fluids. Congestion is best treated by hydration, when mucus is wetter, it is thinner, less sticky, and easier to expel from the body, either through coughing up drainage, or by blowing your nose.   Get plenty of rest.   Use saline nasal drops and blow your nose frequently. Run a humidifier at night and elevate the head of the bed. Vicks Vapor rub will help with congestion and cough. Steam showers and sinus massage for congestion.   Use Acetaminophen or Ibuprofen as needed for fever or pain. Avoid second hand smoke. Even the smallest exposure will worsen symptoms.   Over the counter medications you can try include Delsym for cough, a decongestant for congestion, and Mucinex or Robitussin as an expectorant. Be sure to just get the plain Mucinex or Robitussin that just has one medication (Guaifenesen). We don't recommend the combination products. Note, be sure to drink two glasses of water with each dose of Mucinex as the medication will not work well without adequate hydration.   You can also try a teaspoon of honey to see if this will help reduce cough. Throat lozenges can sometimes be beneficial as well.    This illness will typically last 7 - 10 days.   Please follow up with our clinic if you develop a fever greater than 101 F, symptoms worsen, or do not resolve in the next week.     

## 2016-06-23 NOTE — Progress Notes (Signed)
Subjective:    Patient ID: Angela Mendoza, female    DOB: May 07, 1951, 65 y.o.   MRN: [id number]  CC: Angela Mendoza is a 65 y.o. female who presents today for an acute visit.    HPI: Here for acute visit for sinus congestion. States it has been chronic however worsening over the past month. Has seasonal allergies and asthma. Endorses ear pressure, more tenderness, sinus congestion with clear runny discharge. No fever, chills. On allegra, dymista daily with improvement. Has been doing work around the house and dust has triggered congestion.     HISTORY:  Past Medical History:  Diagnosis Date  . ABNORMAL HEART RHYTHMS 06/09/2008  . Allergic rhinitis 06/09/2008   chronic  . Asthma, intrinsic 10/06/2011   controlled on qvar. [name] 09/2011:  Very mild airflow obstruction (FEV1% 68, FVL c/w obstruction)   . Chronic headache 06/09/2008  . COPD (chronic obstructive pulmonary disease) ([place])   . Depression   . Diverticulosis 2013   h/o diverticulitis  . Dry eyes   . GERD (gastroesophageal reflux disease)   . History of breast implant removal    silicone mastitis - R axilla silicone LN, free silicone L breast upper outer quadrant  . Hot flashes   . HYPERLIPIDEMIA 06/09/2008  . HYPERTENSION 06/09/2008  . Hypothyroidism   . MVP (mitral valve prolapse)   . OSA on CPAP    [name]  . Pernicious anemia    per prior pcp  . Rosacea   . Surgical menopause 1991  . Vertigo   . Vitamin D deficiency    Past Surgical History:  Procedure Laterality Date  . APPENDECTOMY  2007  . BREAST ENHANCEMENT SURGERY  2006  . CARDIOVASCULAR STRESS TEST  2013   [place] [name] - WNL, EF 55-60%, with 0 calcium score  . CHOLECYSTECTOMY  2007   biliary dyskinesia  . COLONOSCOPY  08/2012   diverticulosis  . COLONOSCOPY  2006   inflamed hyperplastic polyp  . ESOPHAGOGASTRODUODENOSCOPY  08/2012   esophageal reflux  . NASAL SINUS SURGERY    . RECTOCELE REPAIR  12/2008   [name]  . sleep study  08/2007    OSA, 12 cm H2O,  . TOTAL ABDOMINAL HYSTERECTOMY  1991   heavy bleeding, ovaries removed  . TUBAL LIGATION    . US ECHOCARDIOGRAPHY  09/2009   impaired relaxation, mild tric regurg, normal MV, EF 60%  . US ECHOCARDIOGRAPHY  11/2007   mild-mod MR   Family History  Problem Relation Age of Onset  . Cancer Mother 67    lung, passive smoke  . Cancer Father 73    lung, smoker  . Cancer Paternal Aunt     breast, lung  . Cancer Cousin     breast  . Stroke Sister     TIA  . Hypertension Sister   . Heart failure Sister   . Alzheimer's disease Paternal Aunt   . Aortic dissection Paternal Uncle   . Aortic dissection Maternal Aunt     Allergies: Codeine; Sulfonamide derivatives; and Estradiol Current Outpatient Prescriptions on File Prior to Visit  Medication Sig Dispense Refill  . albuterol (PROVENTIL HFA) 108 (90 BASE) MCG/ACT inhaler Inhale 2 puffs into the lungs every 6 (six) hours as needed. 3 Inhaler 3  . aspirin 81 MG tablet Take 81 mg by mouth daily.      [name] atorvastatin (LIPITOR) 40 MG tablet Take 1 tablet (40 mg total) by mouth daily. 90 tablet 3  .  azelastine (ASTELIN) 0.1 % nasal spray Place 2 sprays into both nostrils 2 (two) times daily. Use in each nostril as directed 90 mL 3  . Azelastine-Fluticasone (DYMISTA) 137-50 MCG/ACT SUSP Place 1 spray into the nose 2 (two) times daily. 69 g 3  . B Complex Vitamins (VITAMIN-B COMPLEX) TABS Take 1 tablet by mouth daily.    . beclomethasone (QVAR) 80 MCG/ACT inhaler Inhale 2 puffs into the lungs 2 (two) times daily. 3 Inhaler 1  . cetirizine (ZYRTEC) 10 MG tablet Take 10 mg by mouth daily as needed for allergies.    . cholecalciferol (VITAMIN D) 1000 units tablet Take 1,000 Units by mouth daily.     [name] conjugated estrogens (PREMARIN) vaginal cream Place 1 Applicatorful vaginally 2 (two) times a week. 90 day supply,  Refill for one year 90 g 3  . cyanocobalamin (,VITAMIN B-12,) 1000 MCG/ML injection Inject 1 mL (1,000 mcg total) into  the muscle every 30 (thirty) days. 3 mL 3  . cycloSPORINE (RESTASIS) 0.05 % ophthalmic emulsion Place 1 drop into both eyes 2 (two) times daily. 3 each 3  . diazepam (VALIUM) 5 MG tablet TAKE 1 TABLET BY MOUTH EVERY 6 HOURS AS NEEDED FOR VERTIGO 30 tablet 1  . Docusate Calcium (STOOL SOFTENER PO) Take 1 tablet by mouth as needed.    [name] EPINEPHrine 0.3 mg/0.3 mL IJ SOAJ injection Inject 0.3 mLs (0.3 mg total) into the muscle once. 2 Device 0  . estradiol (VIVELLE-DOT) 0.075 MG/24HR Place 1 patch onto the skin 2 (two) times a week. (Patient taking differently: Place 1 patch onto the skin 2 (two) times a week. ) 24 patch 3  . estradiol (VIVELLE-DOT) 0.075 MG/24HR Reported on 12/28/2015    . famotidine (PEPCID) 20 MG tablet Take 20 mg by mouth daily as needed for heartburn or indigestion.    . fexofenadine (ALLEGRA ALLERGY) 180 MG tablet Take 1 tablet (180 mg total) by mouth daily. 90 tablet 3  . fluticasone (FLONASE) 50 MCG/ACT nasal spray Place 2 sprays into both nostrils daily. 48 g 3  . Levothyroxine Sodium 50 MCG CAPS Take 1 capsule (50 mcg total) by mouth daily. 90 capsule 3  . meclizine (ANTIVERT) 25 MG tablet Take 1 tablet (25 mg total) by mouth 3 (three) times daily as needed. 270 tablet 1  . metoprolol succinate (TOPROL-XL) 50 MG 24 hr tablet Take 1 tablet (50 mg total) by mouth daily. 90 tablet 3  . montelukast (SINGULAIR) 10 MG tablet Take 1 tablet (10 mg total) by mouth at bedtime. 90 tablet 3  . omega-3 acid ethyl esters (LOVAZA) 1 G capsule Take 1 capsule (1 g total) by mouth daily. 90 capsule 3  . promethazine (PHENERGAN) 25 MG tablet Take 1 tablet (25 mg total) by mouth every 6 (six) hours as needed for nausea or vomiting. 30 tablet 3  . Propylene Glycol (SYSTANE BALANCE) 0.6 % SOLN Apply 1 drop to eye as needed.    . psyllium (METAMUCIL) 58.6 % powder Take 1 packet by mouth as needed.    [name] Spacer/Aero-Holding Chambers (AEROCHAMBER MV) inhaler Use as instructed 1 each 0  .  Syringe/Needle, Disp, (SYRINGE 3CC/20GX1") 20G X 1" 3 ML MISC 1 Syringe by Does not apply route every 30 (thirty) days. For use with b-12 injection monthly 50 each 0  . tretinoin (RETIN-A) 0.01 % gel Apply topically at bedtime. 45 g 0  . urea (CARMOL) 10 % cream Apply topically as needed. 90 g 0  No current facility-administered medications on file prior to visit.     Social History  Substance Use Topics  . Smoking status: Former Smoker    Packs/day: 0.30    Years: 7.00    Types: Cigarettes    Quit date: 10/27/1973  . 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 Hx of children who are grown  . Alcohol use 0.0 oz/week     Comment: very rare    Review of Systems  Constitutional: Negative for chills and fever.  HENT: Positive for congestion, ear pain, rhinorrhea and sinus pressure. Negative for sore throat.   Respiratory: Negative for cough, shortness of breath and wheezing.   Cardiovascular: Negative for chest pain and palpitations.  Gastrointestinal: Negative for nausea and vomiting.      Objective:    There were no vitals taken for this visit.   Physical Exam  Constitutional: She appears well-developed and well-nourished.  HENT:  Head: Normocephalic and atraumatic.  Right Ear: Hearing, external ear and ear canal normal. No drainage, swelling or tenderness. No foreign bodies. Tympanic membrane is erythematous. Tympanic membrane is not bulging. No middle ear effusion. No decreased hearing is noted.  Left Ear: Hearing, external ear and ear canal normal. No drainage, swelling or tenderness. No foreign bodies. Tympanic membrane is erythematous. Tympanic membrane is not bulging.  No middle ear effusion. No decreased hearing is noted.  Nose: Nose normal. No rhinorrhea. Right sinus exhibits no maxillary sinus tenderness and no frontal sinus tenderness. Left sinus exhibits no maxillary sinus tenderness and no frontal sinus tenderness.   Mouth/Throat: Uvula is midline, oropharynx is clear and moist and mucous membranes are normal. No oropharyngeal exudate, posterior oropharyngeal edema, posterior oropharyngeal erythema or tonsillar abscesses.  Eyes: Conjunctivae are normal.  Cardiovascular: Regular rhythm, normal heart sounds and normal pulses.   Pulmonary/Chest: Effort normal and breath sounds normal. She has no wheezes. She has no rhonchi. She has no rales.  Lymphadenopathy:       Head (right side): No submental, no submandibular, no tonsillar, no preauricular, no posterior auricular and no occipital adenopathy present.       Head (left side): No submental, no submandibular, no tonsillar, no preauricular, no posterior auricular and no occipital adenopathy present.    She has no cervical adenopathy.  Neurological: She is alert.  Skin: Skin is warm and dry.  Psychiatric: She has a normal mood and affect. Her speech is normal and behavior is normal. Thought content normal.  Vitals reviewed.      Assessment & Plan:   1. Sinus congestion Suspect more likely allergic etiology ( dust) however due to duration of symptoms and patient history, I printed antibiotic. Advised her to wait 1-2 days with symptom management to see if symptoms improve prior to starting antibiotic. Patient agreed with this plan.   - amoxicillin-clavulanate (AUGMENTIN XR) 1000-62.5 MG 12 hr tablet; Take 2 tablets by mouth 2 (two) times daily.  Dispense: 14 tablet; Refill: 0    I am having Ms. Volanda Napoleon maintain her aspirin, Propylene Glycol, psyllium, Docusate Calcium (STOOL SOFTENER PO), estradiol, fexofenadine, tretinoin, promethazine, urea, cyanocobalamin, SYRINGE 3CC/20GX1", cycloSPORINE, meclizine, omega-3 acid ethyl esters, montelukast, metoprolol succinate, albuterol, azelastine, Levothyroxine Sodium, conjugated estrogens, beclomethasone, EPINEPHrine, atorvastatin, cholecalciferol, cetirizine, famotidine, Vitamin-B Complex, estradiol, fluticasone,  AEROCHAMBER MV, diazepam, and Azelastine-Fluticasone.   No orders of the defined types were placed in this encounter.   Return precautions given.   Risks, benefits, and alternatives of  the medications and treatment plan prescribed today were discussed, and patient expressed understanding.   Education regarding symptom management and diagnosis given to patient on AVS.  Continue to follow with TULLO, Aris Everts, MD for routine health maintenance.   Angela Mendoza and I agreed with plan.   [name], FNP

## 2016-06-23 NOTE — Telephone Encounter (Signed)
Refill medication

## 2016-06-23 NOTE — Progress Notes (Signed)
Pre visit review using our clinic review tool, if applicable. No additional management support is needed unless otherwise documented below in the visit note. 

## 2016-06-29 LAB — HM DEXA SCAN: HM Dexa Scan: NORMAL

## 2016-07-14 ENCOUNTER — Ambulatory Visit (INDEPENDENT_AMBULATORY_CARE_PROVIDER_SITE_OTHER): Payer: Medicare Other

## 2016-07-14 DIAGNOSIS — Z23 Encounter for immunization: Secondary | ICD-10-CM | POA: Diagnosis not present

## 2016-07-29 DIAGNOSIS — K625 Hemorrhage of anus and rectum: Secondary | ICD-10-CM | POA: Diagnosis not present

## 2016-07-29 DIAGNOSIS — K602 Anal fissure, unspecified: Secondary | ICD-10-CM | POA: Diagnosis not present

## 2016-08-18 ENCOUNTER — Ambulatory Visit (INDEPENDENT_AMBULATORY_CARE_PROVIDER_SITE_OTHER): Payer: Medicare Other | Admitting: Pulmonary Disease

## 2016-08-18 ENCOUNTER — Encounter: Payer: Self-pay | Admitting: Pulmonary Disease

## 2016-08-18 VITALS — BP 128/80 | HR 69 | Ht 65.0 in | Wt 183.2 lb

## 2016-08-18 DIAGNOSIS — J4530 Mild persistent asthma, uncomplicated: Secondary | ICD-10-CM | POA: Diagnosis not present

## 2016-08-18 DIAGNOSIS — R058 Other specified cough: Secondary | ICD-10-CM

## 2016-08-18 DIAGNOSIS — J302 Other seasonal allergic rhinitis: Secondary | ICD-10-CM

## 2016-08-18 DIAGNOSIS — Z9989 Dependence on other enabling machines and devices: Secondary | ICD-10-CM | POA: Diagnosis not present

## 2016-08-18 DIAGNOSIS — R05 Cough: Secondary | ICD-10-CM | POA: Diagnosis not present

## 2016-08-18 DIAGNOSIS — J3089 Other allergic rhinitis: Secondary | ICD-10-CM

## 2016-08-18 DIAGNOSIS — B370 Candidal stomatitis: Secondary | ICD-10-CM

## 2016-08-18 DIAGNOSIS — G4733 Obstructive sleep apnea (adult) (pediatric): Secondary | ICD-10-CM | POA: Diagnosis not present

## 2016-08-18 MED ORDER — FLUCONAZOLE 100 MG PO TABS: 100.0000 mg | ORAL_TABLET | Freq: Every day | ORAL | 0 refills | Status: DC

## 2016-08-18 NOTE — Progress Notes (Signed)
Current Outpatient Prescriptions on File Prior to Visit  Medication Sig  . albuterol (PROVENTIL HFA) 108 (90 Base) MCG/ACT inhaler Inhale 2 puffs into the lungs every 6 (six) hours as needed.  [name] aspirin 81 MG tablet Take 81 mg by mouth daily.    [name] atorvastatin (LIPITOR) 40 MG tablet Take 1 tablet (40 mg total) by mouth daily.  . Azelastine-Fluticasone (DYMISTA) 137-50 MCG/ACT SUSP Place 1 spray into the nose 2 (two) times daily.  . B Complex Vitamins (VITAMIN-B COMPLEX) TABS Take 1 tablet by mouth daily.  . beclomethasone (QVAR) 80 MCG/ACT inhaler Inhale 2 puffs into the lungs 2 (two) times daily.  . cetirizine (ZYRTEC) 10 MG tablet Take 10 mg by mouth daily as needed for allergies.  . cholecalciferol (VITAMIN D) 1000 units tablet Take 1,000 Units by mouth daily.   [name] conjugated estrogens (PREMARIN) vaginal cream Place 1 Applicatorful vaginally 2 (two) times a week. 90 day supply,  Refill for one year  . cyanocobalamin (,VITAMIN B-12,) 1000 MCG/ML injection Inject 1 mL (1,000 mcg total) into the muscle every 30 (thirty) days.  . cycloSPORINE (RESTASIS) 0.05 % ophthalmic emulsion Place 1 drop into both eyes 2 (two) times daily.  . diazepam (VALIUM) 5 MG tablet TAKE 1 TABLET BY MOUTH EVERY 6 HOURS AS NEEDED FOR VERTIGO  . Docusate Calcium (STOOL SOFTENER PO) Take 1 tablet by mouth as needed.  [name] EPINEPHrine 0.3 mg/0.3 mL IJ SOAJ injection Inject 0.3 mLs (0.3 mg total) into the muscle once.  . famotidine (PEPCID) 20 MG tablet Take 20 mg by mouth daily as needed for heartburn or indigestion.  . fexofenadine (ALLEGRA ALLERGY) 180 MG tablet Take 1 tablet (180 mg total) by mouth daily.  . Levothyroxine Sodium 50 MCG CAPS Take 1 capsule (50 mcg total) by mouth daily.  . meclizine (ANTIVERT) 25 MG tablet Take 1 tablet (25 mg total) by mouth 3 (three) times daily as needed.  . metoprolol succinate (TOPROL-XL) 50 MG 24 hr tablet Take 1 tablet (50 mg total) by mouth daily.  . montelukast (SINGULAIR) 10 MG  tablet Take 1 tablet (10 mg total) by mouth at bedtime.  [name] omega-3 acid ethyl esters (LOVAZA) 1 g capsule Take 1 capsule (1 g total) by mouth daily.  . promethazine (PHENERGAN) 25 MG tablet Take 1 tablet (25 mg total) by mouth every 6 (six) hours as needed for nausea or vomiting.  [name] Propylene Glycol (SYSTANE BALANCE) 0.6 % SOLN Apply 1 drop to eye as needed.  . psyllium (METAMUCIL) 58.6 % powder Take 1 packet by mouth as needed.  [name] Spacer/Aero-Holding Chambers (AEROCHAMBER MV) inhaler Use as instructed  . Syringe/Needle, Disp, (SYRINGE 3CC/20GX1") 20G X 1" 3 ML MISC 1 Syringe by Does not apply route every 30 (thirty) days. For use with b-12 injection monthly  . tretinoin (RETIN-A) 0.01 % gel Apply topically at bedtime.  . urea (CARMOL) 10 % cream Apply topically as needed.   No current facility-administered medications on file prior to visit.      Chief Complaint  Patient presents with  . Follow-up    Wears CPAP nightly. Denies problems with mask/pressure. DME: [facility]; Pt states that her breathing has been doing okay since last OV. Pt c/o some allergy symptoms - congestion. Using Allegra and Nasal rinses. Pt has thrush from inhalers - currently on Nystatin which does not seem to be getting rid of it.     Sleep tests PSG 09/06/07 >> AHI 19 CPAP 07/16/16 to 08/14/16 >> used  on 30 of 30 nights with average 8 hrs 8 min.  Average AHI 0.5 with CPAP 13 cm H2O  Pulmonary tests Spirometry 12/12 >> FEV1% 68 RAST 11/15/14 >> IgE 30, cats/mold PFT 02/07/16 >> FEV1 2.32 (92%), FEV1% 76, FEF 25-75% 1.56 (71%), TLC 4.25 (81%), DLCO 121, borderline BD from FEF 25-75  Past medical history  HTN, HLD, HA, Depression, GERD, Hypothyroidism, Vertigo, Diverticulosis, Vit D deficiency, Pernicious anemia  Past surgical history, Family history, Social history, Allergies reviewed  Vital Signs BP 128/80 (BP Location: Left Arm, Cuff Size: Normal)   Pulse 69   Ht 5\' 5"  (1.651 m)   Wt 183 lb 3.2 oz (83.1 kg)    SpO2 98%   BMI 30.49 kg/m   History of Present Illness Angela Mendoza is a 65 y.o. female former smoker with OSA and asthma.  She uses CPAP nightly and this helps.  No issue with mask fit.   She has sinus congestion.  She was told by her dentist she has thrush.  Started on nystatin, but doesn't seem to be working.  She had thrush in her esophagus before and had to use diflucan.  Her symptoms now are similar to what she had before.  She uses spacer with Qvar and rinse mouth after each use.  She is not having cough, wheeze, chest tightness, skin rash, or leg swelling.  Physical Exam  General - No distress ENT - No sinus tenderness, faint white oral exudate, no LAN Cardiac - s1s2 regular, no murmur Chest - No wheeze/rales/dullness Back - No focal tenderness Abd - Soft, non-tender Ext - No edema Neuro - Normal strength Skin - No rashes Psych - normal mood, and behavior  Assessment/Plan  Obstructive sleep apnea. - She is compliant, and reports benefit. - continue CPAP 13 cm H2O  Asthma. - continue Qvar, singulair  Allergic rhinitis. - continue singulair, dysmista and zyrtec - will send script for dymista >> explained that insurance might not cover this, and she might need to continue flonase and astelin  Thrush. - will have her use fluconazole   Patient Instructions  Fluconazole 100 mg daily for 7 days  Follow up in 1 year    Chesley Mires, MD [facility] Pager:  [phone] 08/18/2016, 12:17 PM

## 2016-08-18 NOTE — Patient Instructions (Signed)
Fluconazole 100 mg daily for 7 days  Follow up in 1 year

## 2016-09-02 DIAGNOSIS — R7989 Other specified abnormal findings of blood chemistry: Secondary | ICD-10-CM | POA: Diagnosis not present

## 2016-09-02 DIAGNOSIS — E782 Mixed hyperlipidemia: Secondary | ICD-10-CM | POA: Diagnosis not present

## 2016-09-02 DIAGNOSIS — Z79899 Other long term (current) drug therapy: Secondary | ICD-10-CM | POA: Diagnosis not present

## 2016-09-02 DIAGNOSIS — Z0000 Encounter for general adult medical examination without abnormal findings: Secondary | ICD-10-CM | POA: Diagnosis not present

## 2016-09-02 DIAGNOSIS — K601 Chronic anal fissure: Secondary | ICD-10-CM | POA: Diagnosis not present

## 2016-09-02 DIAGNOSIS — Z780 Asymptomatic menopausal state: Secondary | ICD-10-CM | POA: Diagnosis not present

## 2016-09-02 DIAGNOSIS — J4540 Moderate persistent asthma, uncomplicated: Secondary | ICD-10-CM | POA: Diagnosis not present

## 2016-09-02 DIAGNOSIS — I10 Essential (primary) hypertension: Secondary | ICD-10-CM | POA: Diagnosis not present

## 2016-09-02 DIAGNOSIS — Z7952 Long term (current) use of systemic steroids: Secondary | ICD-10-CM | POA: Diagnosis not present

## 2016-09-10 DIAGNOSIS — H25813 Combined forms of age-related cataract, bilateral: Secondary | ICD-10-CM | POA: Diagnosis not present

## 2016-09-10 DIAGNOSIS — H04123 Dry eye syndrome of bilateral lacrimal glands: Secondary | ICD-10-CM | POA: Diagnosis not present

## 2016-09-16 ENCOUNTER — Telehealth: Payer: Self-pay | Admitting: Pulmonary Disease

## 2016-09-16 NOTE — Telephone Encounter (Signed)
VS  Please advise  Spoke with pt. And she is concerned that she is having someone come an stay with her who was dx with pharyngitis and she wanted to check with you, if she was ok to be around them.

## 2016-09-17 ENCOUNTER — Telehealth: Payer: Self-pay | Admitting: Internal Medicine

## 2016-09-17 NOTE — Telephone Encounter (Signed)
Going for dinner 09/18/16 -> she has asthma. Colman Cater to get exposed to somone on tamiflu but got flu last week and to another person who has pharyngitis on d2 of antibiptic and will be staying with her tomorrow. None with active fever.   rec Ok for her to attend dinner  - low risk of getting sick from those 2 individuals  Dr. Brand Males, M.D., [facility].C.P Pulmonary and Critical Care Medicine Staff Physician [place] Pager: [phone], If no answer or between  15:00h - 7:00h: call [phone]  09/17/2016 4:01 PM

## 2016-09-22 NOTE — Telephone Encounter (Signed)
Pt aware of rec's per VS. Pt states that this is exactly what she did during the Holidays was avoid close contact with anyone with sick symptoms and practiced good hand hygiene. Nothing further needed.

## 2016-09-22 NOTE — Telephone Encounter (Signed)
It depends on what the cause of the other persons pharyngitis is from.  Main thing is to ensure that she practices good hand hygiene, and avoids instances in which the other person is coughing/sneezing.

## 2016-09-22 NOTE — Telephone Encounter (Signed)
VS please advise. Thanks! 

## 2016-09-26 LAB — BASIC METABOLIC PANEL
BUN: 12 (ref 4–21)
Creatinine: 0.8 (ref 0.5–1.1)
Glucose: 80
Potassium: 4.3 (ref 3.4–5.3)
Sodium: 142 (ref 137–147)

## 2016-09-26 LAB — HEPATIC FUNCTION PANEL
ALT: 23 (ref 7–35)
AST: 24 (ref 13–35)
Alkaline Phosphatase: 62 (ref 25–125)
Bilirubin, Total: 0.4

## 2016-10-17 ENCOUNTER — Ambulatory Visit: Payer: Medicare Other | Admitting: Internal Medicine

## 2016-12-19 ENCOUNTER — Telehealth: Payer: Self-pay | Admitting: Pulmonary Disease

## 2016-12-19 MED ORDER — AEROCHAMBER MV MISC: 0 refills | Status: DC

## 2016-12-19 MED ORDER — BECLOMETHASONE DIPROPIONATE 80 MCG/ACT IN AERS: 2.0000 | INHALATION_SPRAY | Freq: Two times a day (BID) | RESPIRATORY_TRACT | 3 refills | Status: DC

## 2016-12-19 NOTE — Telephone Encounter (Signed)
Called and spoke with pt and she is aware of refill of the qvar that has been sent to the mail order pharmacy.  Pt also requested that a spacer be sent in too.  This has been done. Nothing further is needed.

## 2017-01-29 ENCOUNTER — Ambulatory Visit: Payer: Medicare Other

## 2017-02-02 DIAGNOSIS — R5383 Other fatigue: Secondary | ICD-10-CM | POA: Diagnosis not present

## 2017-02-24 LAB — LIPID PANEL
Cholesterol: 150 (ref 0–200)
HDL: 56 (ref 35–70)
LDL Cholesterol: 71
Triglycerides: 117 (ref 40–160)

## 2017-02-24 LAB — TSH: TSH: 1.8 (ref 0.41–5.90)

## 2017-03-18 DIAGNOSIS — E782 Mixed hyperlipidemia: Secondary | ICD-10-CM | POA: Diagnosis not present

## 2017-03-18 DIAGNOSIS — E039 Hypothyroidism, unspecified: Secondary | ICD-10-CM | POA: Diagnosis not present

## 2017-03-18 DIAGNOSIS — Z79899 Other long term (current) drug therapy: Secondary | ICD-10-CM | POA: Diagnosis not present

## 2017-03-18 DIAGNOSIS — I10 Essential (primary) hypertension: Secondary | ICD-10-CM | POA: Diagnosis not present

## 2017-03-18 DIAGNOSIS — R7989 Other specified abnormal findings of blood chemistry: Secondary | ICD-10-CM | POA: Diagnosis not present

## 2017-03-19 DIAGNOSIS — E782 Mixed hyperlipidemia: Secondary | ICD-10-CM | POA: Diagnosis not present

## 2017-03-19 DIAGNOSIS — E039 Hypothyroidism, unspecified: Secondary | ICD-10-CM | POA: Diagnosis not present

## 2017-03-19 DIAGNOSIS — Z79899 Other long term (current) drug therapy: Secondary | ICD-10-CM | POA: Diagnosis not present

## 2017-03-26 ENCOUNTER — Ambulatory Visit
Admission: RE | Admit: 2017-03-26 | Discharge: 2017-03-26 | Disposition: A | Discharge status: Home / Self Care | Payer: Medicare Other | Source: Ambulatory Visit | Attending: Vascular Surgery | Admitting: Vascular Surgery

## 2017-03-26 DIAGNOSIS — Z1231 Encounter for screening mammogram for malignant neoplasm of breast: Secondary | ICD-10-CM

## 2017-03-27 ENCOUNTER — Encounter: Payer: Self-pay | Admitting: Pulmonary Disease

## 2017-03-27 NOTE — Telephone Encounter (Signed)
Dr Halford Chessman, I received a letter from my [facility] prescription service stating that my QVAR inhaler has been disconnected and they will not be sending any more of the beclomethasone HFA (QVAR)and that a new prescription needs to be ordered from you, my dr, for the manufacturer's new QVAR replacement.  The replacement they suggested is QVAR RediHaler. If this is acceptable to you, Dr Halford Chessman, could this please be ordered since I need this daily maintenance inhaler.  My [employer] insurance still requests a 90-day prescription ordered with up to 3 refills for this new medication. Please RSVP if this has been ordered electronically online [facility], by Dr Halford Chessman.Thank you, Angela Mendoza  Dr.Sood, is it ok to send in a RX for the [place] for her? Please advise.

## 2017-03-30 MED ORDER — BECLOMETHASONE DIPROP HFA 80 MCG/ACT IN AERB: 2.0000 | INHALATION_SPRAY | Freq: Two times a day (BID) | RESPIRATORY_TRACT | 3 refills | Status: DC

## 2017-03-30 NOTE — Telephone Encounter (Signed)
Please send 90 day supply for Qvar redihaler 80 mcg bid, with 3 refills.

## 2017-03-31 ENCOUNTER — Other Ambulatory Visit: Payer: Self-pay | Admitting: Internal Medicine

## 2017-03-31 ENCOUNTER — Ambulatory Visit (INDEPENDENT_AMBULATORY_CARE_PROVIDER_SITE_OTHER): Payer: Medicare Other | Admitting: Internal Medicine

## 2017-03-31 ENCOUNTER — Encounter: Payer: Self-pay | Admitting: Internal Medicine

## 2017-03-31 DIAGNOSIS — F418 Other specified anxiety disorders: Secondary | ICD-10-CM | POA: Diagnosis not present

## 2017-03-31 DIAGNOSIS — E034 Atrophy of thyroid (acquired): Secondary | ICD-10-CM | POA: Diagnosis not present

## 2017-03-31 DIAGNOSIS — I10 Essential (primary) hypertension: Secondary | ICD-10-CM | POA: Diagnosis not present

## 2017-03-31 DIAGNOSIS — B3781 Candidal esophagitis: Secondary | ICD-10-CM

## 2017-03-31 MED ORDER — OMEGA-3-ACID ETHYL ESTERS 1 G PO CAPS: 1.0000 g | ORAL_CAPSULE | Freq: Every day | ORAL | 3 refills | Status: DC

## 2017-03-31 MED ORDER — ALPRAZOLAM 0.25 MG PO TABS: ORAL_TABLET | ORAL | 2 refills | Status: DC

## 2017-03-31 MED ORDER — FLUCONAZOLE 150 MG PO TABS: 150.0000 mg | ORAL_TABLET | Freq: Every day | ORAL | 1 refills | Status: DC
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
## 2017-03-31 NOTE — Assessment & Plan Note (Signed)
Reviewed recent labs done at [facility].  TSH is therapeutic,  Continue current levothyoxine dose,  Recheck in 6 months

## 2017-03-31 NOTE — Assessment & Plan Note (Signed)
Recurrent secondary to use of steroid inhalers

## 2017-03-31 NOTE — Assessment & Plan Note (Signed)
Improved with addition of losartan 50 mg and increase in Toprol dose,  No changes today

## 2017-03-31 NOTE — Patient Instructions (Signed)
Continue your current medications .  Take the 1/2 metoprolol succinate  Tonight  I give you permission to use the alprazolam 1/2 tablet twice daily  While your daughter is here  Write her the letter we discussed  Keep it Biblically minded.  Do not insult her  STAND YOUR GROUND

## 2017-04-06 ENCOUNTER — Encounter: Payer: Self-pay | Admitting: Internal Medicine

## 2017-04-07 MED ORDER — LOSARTAN POTASSIUM 50 MG PO TABS: 50.0000 mg | ORAL_TABLET | Freq: Every day | ORAL | 3 refills | Status: DC

## 2017-04-21 DIAGNOSIS — N6011 Diffuse cystic mastopathy of right breast: Secondary | ICD-10-CM | POA: Diagnosis not present

## 2017-04-21 DIAGNOSIS — N6012 Diffuse cystic mastopathy of left breast: Secondary | ICD-10-CM | POA: Diagnosis not present

## 2017-05-04 ENCOUNTER — Ambulatory Visit: Payer: Medicare Other | Admitting: Internal Medicine

## 2017-05-07 DIAGNOSIS — M79661 Pain in right lower leg: Secondary | ICD-10-CM | POA: Diagnosis not present

## 2017-05-07 DIAGNOSIS — W109XXA Fall (on) (from) unspecified stairs and steps, initial encounter: Secondary | ICD-10-CM | POA: Diagnosis not present

## 2017-05-07 DIAGNOSIS — S8011XA Contusion of right lower leg, initial encounter: Secondary | ICD-10-CM | POA: Diagnosis not present

## 2017-05-14 ENCOUNTER — Ambulatory Visit (INDEPENDENT_AMBULATORY_CARE_PROVIDER_SITE_OTHER): Payer: Medicare Other | Admitting: Family

## 2017-05-14 ENCOUNTER — Encounter: Payer: Self-pay | Admitting: Family

## 2017-05-14 ENCOUNTER — Ambulatory Visit
Admission: RE | Admit: 2017-05-14 | Discharge: 2017-05-14 | Disposition: A | Discharge status: Home / Self Care | Payer: Medicare Other | Source: Ambulatory Visit | Attending: Family | Admitting: Family

## 2017-05-14 ENCOUNTER — Ambulatory Visit (INDEPENDENT_AMBULATORY_CARE_PROVIDER_SITE_OTHER): Payer: Medicare Other

## 2017-05-14 VITALS — BP 122/82 | HR 83 | Temp 98.2°F | Ht 65.0 in | Wt 174.4 lb

## 2017-05-14 DIAGNOSIS — X58XXXA Exposure to other specified factors, initial encounter: Secondary | ICD-10-CM | POA: Diagnosis not present

## 2017-05-14 DIAGNOSIS — S8011XA Contusion of right lower leg, initial encounter: Secondary | ICD-10-CM | POA: Diagnosis not present

## 2017-05-14 DIAGNOSIS — S8011XD Contusion of right lower leg, subsequent encounter: Secondary | ICD-10-CM

## 2017-05-14 DIAGNOSIS — S0990XA Unspecified injury of head, initial encounter: Secondary | ICD-10-CM

## 2017-05-14 DIAGNOSIS — X58XXXD Exposure to other specified factors, subsequent encounter: Secondary | ICD-10-CM | POA: Insufficient documentation

## 2017-05-14 DIAGNOSIS — M7989 Other specified soft tissue disorders: Secondary | ICD-10-CM | POA: Insufficient documentation

## 2017-05-14 DIAGNOSIS — S9001XA Contusion of right ankle, initial encounter: Secondary | ICD-10-CM | POA: Diagnosis not present

## 2017-05-14 DIAGNOSIS — L0292 Furuncle, unspecified: Secondary | ICD-10-CM | POA: Diagnosis not present

## 2017-05-14 DIAGNOSIS — L089 Local infection of the skin and subcutaneous tissue, unspecified: Secondary | ICD-10-CM | POA: Insufficient documentation

## 2017-05-14 DIAGNOSIS — L723 Sebaceous cyst: Secondary | ICD-10-CM

## 2017-05-14 LAB — CBC WITH DIFFERENTIAL/PLATELET
Basophils Absolute: 0 10*3/uL (ref 0.0–0.1)
Basophils Relative: 0.2 % (ref 0.0–3.0)
Eosinophils Absolute: 0.1 10*3/uL (ref 0.0–0.7)
Eosinophils Relative: 0.6 % (ref 0.0–5.0)
HCT: 41.3 % (ref 36.0–46.0)
Hemoglobin: 13.8 g/dL (ref 12.0–15.0)
Lymphocytes Relative: 17.7 % (ref 12.0–46.0)
Lymphs Abs: 1.8 10*3/uL (ref 0.7–4.0)
MCHC: 33.3 g/dL (ref 30.0–36.0)
MCV: 94.7 fl (ref 78.0–100.0)
Monocytes Absolute: 0.6 10*3/uL (ref 0.1–1.0)
Monocytes Relative: 6.4 % (ref 3.0–12.0)
Neutro Abs: 7.6 10*3/uL (ref 1.4–7.7)
Neutrophils Relative %: 75.1 % (ref 43.0–77.0)
Platelets: 345 10*3/uL (ref 150.0–400.0)
RBC: 4.36 Mil/uL (ref 3.87–5.11)
RDW: 13.3 % (ref 11.5–15.5)
WBC: 10.1 10*3/uL (ref 4.0–10.5)

## 2017-05-14 LAB — C-REACTIVE PROTEIN: CRP: 0.2 mg/dL — ABNORMAL LOW (ref 0.5–20.0)

## 2017-05-14 LAB — SEDIMENTATION RATE: Sed Rate: 14 mm/hr (ref 0–30)

## 2017-05-14 MED ORDER — DOXYCYCLINE HYCLATE 100 MG PO TABS: 100.0000 mg | ORAL_TABLET | Freq: Two times a day (BID) | ORAL | 0 refills | Status: DC

## 2017-05-14 NOTE — Patient Instructions (Addendum)
Warm compresses   Start antibiotic for boil   Ensure to take probiotics while on antibiotics and also for 2 weeks after completion. It is important to re-colonize the gut with good bacteria and also to prevent any diarrheal infections associated with antibiotic use.   Stat CT head and Stat ultrasound  Xrays here.   Labs   Follow up next week, sooner if not better

## 2017-05-14 NOTE — Assessment & Plan Note (Signed)
Localized infection. Discussed oral antibiotics. Nonfluctuant;  I explained to patient that I would be hesitant to lance at this point. Encouraged warm compresses. Return precautions given.

## 2017-05-14 NOTE — Assessment & Plan Note (Addendum)
Swelling has improved per patient.wells criteria low for dvt. Pending [place], x-ray films, Sedimentation rate, CRP. No systemic features. Of note, discussed chronic history of easy bruising with patient. Advised her to follow-up PCP for further evaluation for this., she agreed.

## 2017-05-14 NOTE — Progress Notes (Signed)
Subjective:    Patient ID: Angela Mendoza, female    DOB: Jul 31, 1951, 66 y.o.   MRN: [id number]  CC: Angela Mendoza is a 66 y.o. female who presents today for follow up.   HPI: Right calf bruising and swelling, improving.  No fever, N, V, purulent discharge from hematoma, sob, chest pain.   Seen at [facility] for right ankle hematoma which occurred on 05/06/17 after fall down stairs. Right ankle slipped and hit rail with right inner calf. Notes twisting right ankle. Hit head 'briefly' on rail. No LOC.  No vomiting, HA, vision changes.  Has been elevating with some relief.  Notes clicking and popping in right ankle; describes throbbing in right lower leg. Able to weight bare right after injury, the following day, became painful to walk on it.    On asa 81mg . Notes she bruises easily. No numbness, tingling.    XR tib-fib -unable to see Xray.  No h/o DVT.   Soft tissue cyst on abdomen for over a year. Has seen derm. Thought a cyst a that time. Has grown in size, redness, tender over past week. No drainage.       HISTORY:  Past Medical History:  Diagnosis Date  . ABNORMAL HEART RHYTHMS 06/09/2008  . Allergic rhinitis 06/09/2008   chronic  . Asthma, intrinsic 10/06/2011   controlled on qvar. [name] 09/2011:  Very mild airflow obstruction (FEV1% 68, FVL c/w obstruction)   . Chronic headache 06/09/2008  . COPD (chronic obstructive pulmonary disease) ([place])   . Depression   . Diverticulosis 2013   h/o diverticulitis  . Dry eyes   . GERD (gastroesophageal reflux disease)   . History of breast implant removal    silicone mastitis - R axilla silicone LN, free silicone L breast upper outer quadrant  . Hot flashes   . HYPERLIPIDEMIA 06/09/2008  . HYPERTENSION 06/09/2008  . Hypothyroidism   . MVP (mitral valve prolapse)   . OSA on CPAP    [name]  . Pernicious anemia    per prior pcp  . Rosacea   . Surgical menopause 1991  . Vertigo   . Vitamin D deficiency    Past Surgical  History:  Procedure Laterality Date  . APPENDECTOMY  2007  . BREAST BIOPSY Left 09/27/2009  . BREAST ENHANCEMENT SURGERY  2006  . CARDIOVASCULAR STRESS TEST  2013   [place] [name] - WNL, EF 55-60%, with 0 calcium score  . CHOLECYSTECTOMY  2007   biliary dyskinesia  . COLONOSCOPY  08/2012   diverticulosis  . COLONOSCOPY  2006   inflamed hyperplastic polyp  . ESOPHAGOGASTRODUODENOSCOPY  08/2012   esophageal reflux  . NASAL SINUS SURGERY    . RECTOCELE REPAIR  12/2008   [name]  . sleep study  08/2007   OSA, 12 cm H2O,  . TOTAL ABDOMINAL HYSTERECTOMY  1991   heavy bleeding, ovaries removed  . TUBAL LIGATION    . US ECHOCARDIOGRAPHY  09/2009   impaired relaxation, mild tric regurg, normal MV, EF 60%  . US ECHOCARDIOGRAPHY  11/2007   mild-mod MR   Family History  Problem Relation Age of Onset  . Cancer Mother 59       lung, passive smoke  . Cancer Father 53       lung, smoker  . Cancer Paternal Aunt        breast, lung  . Cancer Cousin        breast  . Stroke Sister  TIA  . Hypertension Sister   . Heart failure Sister   . Alzheimer's disease Paternal Aunt   . Aortic dissection Paternal Uncle   . Aortic dissection Maternal Aunt   . Breast cancer Maternal Aunt     Allergies: Codeine; Sulfonamide derivatives; and Estradiol Current Outpatient Prescriptions on File Prior to Visit  Medication Sig Dispense Refill  . albuterol (PROVENTIL HFA) 108 (90 Base) MCG/ACT inhaler Inhale 2 puffs into the lungs every 6 (six) hours as needed. 3 Inhaler 3  . ALPRAZolam (XANAX) 0.25 MG tablet TAKE 1 TABLET (0.25 MG TOTAL) BY MOUTH ONCE DAILY AS NEEDED FOR SLEEP. 30 tablet 2  . aspirin 81 MG tablet Take 81 mg by mouth daily.      [name] atorvastatin (LIPITOR) 40 MG tablet Take 1 tablet (40 mg total) by mouth daily. 90 tablet 3  . Azelastine-Fluticasone (DYMISTA) 137-50 MCG/ACT SUSP Place 1 spray into the nose 2 (two) times daily. 69 g 3  . B Complex Vitamins (VITAMIN-B COMPLEX)  TABS Take 1 tablet by mouth daily.    . beclomethasone (QVAR REDIHALER) 80 MCG/ACT inhaler Inhale 2 puffs into the lungs 2 (two) times daily. 3 Inhaler 3  . cholecalciferol (VITAMIN D) 1000 units tablet Take 1,000 Units by mouth daily.     [name] conjugated estrogens (PREMARIN) vaginal cream Place 1 Applicatorful vaginally 2 (two) times a week. 90 day supply,  Refill for one year 90 g 3  . cyanocobalamin (,VITAMIN B-12,) 1000 MCG/ML injection Inject 1 mL (1,000 mcg total) into the muscle every 30 (thirty) days. 3 mL 3  . cycloSPORINE (RESTASIS) 0.05 % ophthalmic emulsion Place 1 drop into both eyes 2 (two) times daily. 3 each 3  . diazepam (VALIUM) 5 MG tablet TAKE 1 TABLET BY MOUTH EVERY 6 HOURS AS NEEDED FOR VERTIGO 30 tablet 1  . Docusate Calcium (STOOL SOFTENER PO) Take 1 tablet by mouth as needed.    [name] EPINEPHrine 0.3 mg/0.3 mL IJ SOAJ injection Inject 0.3 mLs (0.3 mg total) into the muscle once. 2 Device 0  . estradiol (VIVELLE-DOT) 0.1 MG/24HR patch Place 1 patch onto the skin 2 (two) times a week.    . famotidine (PEPCID) 20 MG tablet Take 20 mg by mouth daily as needed for heartburn or indigestion.    . fexofenadine (ALLEGRA ALLERGY) 180 MG tablet Take 1 tablet (180 mg total) by mouth daily. 90 tablet 3  . fluconazole (DIFLUCAN) 100 MG tablet Take 1 tablet (100 mg total) by mouth daily. 7 tablet 0  . fluconazole (DIFLUCAN) 150 MG tablet Take 1 tablet (150 mg total) by mouth daily. 2 tablet 1  . Levothyroxine Sodium 50 MCG CAPS Take 1 capsule (50 mcg total) by mouth daily. 90 capsule 3  . Lifitegrast (XIIDRA) 5 % SOLN Apply 1 drop to eye 2 (two) times daily.    [name] losartan (COZAAR) 50 MG tablet Take 1 tablet (50 mg total) by mouth daily. 90 tablet 3  . meclizine (ANTIVERT) 25 MG tablet Take 1 tablet (25 mg total) by mouth 3 (three) times daily as needed. 270 tablet 1  . metoprolol succinate (TOPROL-XL) 50 MG 24 hr tablet Take 1 tablet (50 mg total) by mouth daily. (Patient taking differently:  Take 50 mg by mouth daily. ) 90 tablet 3  . montelukast (SINGULAIR) 10 MG tablet Take 1 tablet (10 mg total) by mouth at bedtime. 90 tablet 3  . omega-3 acid ethyl esters (LOVAZA) 1 g capsule Take 1 capsule (1 g  total) by mouth daily. 90 capsule 3  . promethazine (PHENERGAN) 25 MG tablet Take 1 tablet (25 mg total) by mouth every 6 (six) hours as needed for nausea or vomiting. 30 tablet 3  . Propylene Glycol (SYSTANE BALANCE) 0.6 % SOLN Apply 1 drop to eye as needed.    . psyllium (METAMUCIL) 58.6 % powder Take 1 packet by mouth as needed.    [name] Spacer/Aero-Holding Chambers (AEROCHAMBER MV) inhaler Use as instructed 1 each 0  . Syringe/Needle, Disp, (SYRINGE 3CC/20GX1") 20G X 1" 3 ML MISC 1 Syringe by Does not apply route every 30 (thirty) days. For use with b-12 injection monthly 50 each 0  . tretinoin (RETIN-A) 0.01 % gel Apply topically at bedtime. 45 g 0  . urea (CARMOL) 10 % cream Apply topically as needed. 90 g 0   No current facility-administered medications on file prior to visit.     Social History  Substance Use Topics  . Smoking status: Former Smoker    Packs/day: 0.30    Years: 7.00    Types: Cigarettes    Quit date: 10/27/1973  . 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 Hx of children who are grown  . Alcohol use 0.0 oz/week     Comment: very rare    Review of Systems  Constitutional: Negative for chills and fever.  Respiratory: Negative for cough, shortness of breath and wheezing.   Cardiovascular: Positive for leg swelling. Negative for chest pain and palpitations.  Gastrointestinal: Negative for nausea and vomiting.  Musculoskeletal: Negative for joint swelling.  Skin: Positive for color change and wound. Negative for rash.  Hematological: Bruises/bleeds easily (chronic, on ASA 81mg ).      Objective:    BP 122/82   Pulse 83   Temp 98.2 F (36.8 C) (Oral)   Ht 5\' 5"  (1.651 m)   Wt 174 lb 6.4 oz (79.1 kg)    SpO2 97%   BMI 29.02 kg/m  BP Readings from Last 3 Encounters:  05/14/17 122/82  03/31/17 134/82  08/18/16 128/80   Wt Readings from Last 3 Encounters:  05/14/17 174 lb 6.4 oz (79.1 kg)  03/31/17 173 lb 9.6 oz (78.7 kg)  08/18/16 183 lb 3.2 oz (83.1 kg)    Physical Exam  Constitutional: She appears well-developed and well-nourished.  HENT:  Mouth/Throat: Uvula is midline, oropharynx is clear and moist and mucous membranes are normal.  No edema, ecchymosis of scalp noted.   Eyes: Pupils are equal, round, and reactive to light. Conjunctivae and EOM are normal.  Fundus normal bilaterally.   Cardiovascular: Normal rate, regular rhythm, normal heart sounds and normal pulses.    RLE non pitting edema. No palpable cords or masses. No erythema or increased warmth. Slight asymmetry in calf size when compared bilaterally, R > L.  LE hair growth symmetric and present. No discoloration of varicosities noted. LE warm and palpable pedal pulses.   Pulmonary/Chest: Effort normal and breath sounds normal. She has no wheezes. She has no rhonchi. She has no rales.  Neurological: She is alert. She has normal strength. No cranial nerve deficit or sensory deficit. She displays a negative Romberg sign.  Reflex Scores:      Bicep reflexes are 2+ on the right side and 2+ on the left side.      Patellar reflexes are 2+ on the right side and 2+ on the left side. Grip equal and strong bilateral upper extremities. Gait  strong and steady. Able to perform rapid alternating movement without difficulty.   Skin: Skin is warm and dry. Bruising and ecchymosis noted. No lesion noted. No erythema.     Well-circumscribed erythematous fluctuant boil noted on diagram. No drainage, streaking.  Hematoma noted RLE, localized. Tender to palptation. No purulent discharge, streaking, increased warmth.   Psychiatric: She has a normal mood and affect. Her speech is normal and behavior is normal. Thought content normal.    Vitals reviewed.      Assessment & Plan:   Problem List Items Addressed This Visit      Musculoskeletal and Integument   Boil    Localized infection. Discussed oral antibiotics. Nonfluctuant;  I explained to patient that I would be hesitant to lance at this point. Encouraged warm compresses. Return precautions given.      Relevant Medications   doxycycline (VIBRA-TABS) 100 MG tablet     Other   Leg hematoma, right, subsequent encounter - Primary    Swelling has improved per patient.wells criteria low for dvt. Pending [place], x-ray films, Sedimentation rate, CRP. No systemic features. Of note, discussed chronic history of easy bruising with patient. Advised her to follow-up PCP for further evaluation for this., she agreed.      Relevant Orders   DG Tibia/Fibula Right (Completed)   DG Ankle Complete Right (Completed)   US Venous Img Lower Unilateral Right (Completed)   CBC with Differential/Platelet   C-reactive protein   Sedimentation rate   Head injury    Reassured by normal neurologic exam. As patient did hit head on metal rail, pending stat CT head.      Relevant Orders   CT Head Wo Contrast (Completed)       I am having Ms. Check start on doxycycline. I am also having her maintain her aspirin, Propylene Glycol, psyllium, Docusate Calcium (STOOL SOFTENER PO), fexofenadine, tretinoin, urea, EPINEPHrine, cholecalciferol, famotidine, Vitamin-B Complex, diazepam, albuterol, atorvastatin, Azelastine-Fluticasone, conjugated estrogens, cyanocobalamin, cycloSPORINE, Levothyroxine Sodium, meclizine, metoprolol succinate, montelukast, promethazine, SYRINGE 3CC/20GX1", estradiol, fluconazole, AEROCHAMBER MV, beclomethasone, Lifitegrast, ALPRAZolam, fluconazole, omega-3 acid ethyl esters, and losartan.   Meds ordered this encounter  Medications  . doxycycline (VIBRA-TABS) 100 MG tablet    Sig: Take 1 tablet (100 mg total) by mouth 2 (two) times daily.    Dispense:  14 tablet     Refill:  0    Order Specific Question:   Supervising Provider    Answer:   Crecencio Mc [[id number]]    Return precautions given.   Risks, benefits, and alternatives of the medications and treatment plan prescribed today were discussed, and patient expressed understanding.   Education regarding symptom management and diagnosis given to patient on AVS.  Continue to follow with Crecencio Mc, MD for routine health maintenance.   Angela Mendoza and I agreed with plan.   [name], FNP

## 2017-05-14 NOTE — Assessment & Plan Note (Signed)
Reassured by normal neurologic exam. As patient did hit head on metal rail, pending stat CT head.

## 2017-05-14 NOTE — Progress Notes (Signed)
Pre visit review using our clinic review tool, if applicable. No additional management support is needed unless otherwise documented below in the visit note. 

## 2017-05-20 ENCOUNTER — Ambulatory Visit (INDEPENDENT_AMBULATORY_CARE_PROVIDER_SITE_OTHER): Payer: Medicare Other | Admitting: Internal Medicine

## 2017-05-20 ENCOUNTER — Encounter: Payer: Self-pay | Admitting: Internal Medicine

## 2017-05-20 DIAGNOSIS — I10 Essential (primary) hypertension: Secondary | ICD-10-CM | POA: Diagnosis not present

## 2017-05-20 DIAGNOSIS — F430 Acute stress reaction: Secondary | ICD-10-CM

## 2017-05-20 DIAGNOSIS — J0100 Acute maxillary sinusitis, unspecified: Secondary | ICD-10-CM | POA: Diagnosis not present

## 2017-05-20 DIAGNOSIS — S8011XD Contusion of right lower leg, subsequent encounter: Secondary | ICD-10-CM

## 2017-05-20 DIAGNOSIS — F411 Generalized anxiety disorder: Secondary | ICD-10-CM

## 2017-05-20 DIAGNOSIS — L0292 Furuncle, unspecified: Secondary | ICD-10-CM

## 2017-05-20 MED ORDER — CEPHALEXIN 500 MG PO CAPS: 500.0000 mg | ORAL_CAPSULE | Freq: Four times a day (QID) | ORAL | 0 refills | Status: DC
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
## 2017-05-20 NOTE — Patient Instructions (Addendum)
Changing antibiotics to Keflex 4 times daily for one week  Change soap to Dial (antibacterial)  Keep wound covered    Suspend aspirin for 5 days   elevate leg as much as possible and continue cool compresses   No need to constrict with dressings   Stay on the probiotic

## 2017-05-23 DIAGNOSIS — F411 Generalized anxiety disorder: Secondary | ICD-10-CM | POA: Insufficient documentation

## 2017-05-23 DIAGNOSIS — J0100 Acute maxillary sinusitis, unspecified: Secondary | ICD-10-CM | POA: Insufficient documentation

## 2017-05-23 DIAGNOSIS — F430 Acute stress reaction: Secondary | ICD-10-CM

## 2017-05-23 NOTE — Assessment & Plan Note (Signed)
improved but not resolved.  Changing abx to cephalexin

## 2017-05-23 NOTE — Assessment & Plan Note (Addendum)
Symptoms of congestion and blood streaked nasal discharge for several days.  Sinus rinses,  Keflex for concurrent skin infection.
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
## 2017-05-23 NOTE — Assessment & Plan Note (Addendum)
Well controlled,  Bradycardia today from metoprolol is asymptomatic and new.  No changes unless persistent and/or symptomatic

## 2017-05-23 NOTE — Assessment & Plan Note (Signed)
Secondary to blunt trauma from a fall while on a staircase at home.  Improving slowly. Continue elevation,  Cool compresses,  Avid compressive garments.

## 2017-05-25 ENCOUNTER — Telehealth: Payer: Self-pay | Admitting: *Deleted

## 2017-05-25 NOTE — Telephone Encounter (Signed)
Patient was prescribed an antibiotic, pt is currently having hives on her left calf and itching . No pain or other symptoms. Pt suspects that this is coming from her antibiotic. Pt requested  call to discuss medication  Pt contact [phone]

## 2017-05-25 NOTE — Telephone Encounter (Signed)
Spoke with pt and she stated that she now isn't sure if it is the antibiotic or the Dial soap she has been using. She stated that many years ago she was allergic to dial soap and that she still has sensitive skin, so she is wondering if it could be the soap that caused the hives. Then she stated that she took her first dose of medication for today at 8:30am and by 10:00am she noticed she had a place on her leg about 5x5 with 12 little patches of hives in it. She used anti itch cream and stated that two hours later the hives were gone and nothing else has appeare. Pt is wanting to know if she needs to stop the antibiotics or stop using the soap? This is not the first day using either the antibiotic or the soap she stated that she started of those last Wednesday.

## 2017-05-25 NOTE — Telephone Encounter (Signed)
Spoke with pt and informed her of Dr. Lupita Dawn instructions below. Pt gave a verbal understanding.

## 2017-05-25 NOTE — Telephone Encounter (Signed)
Stop the dial soap and continue the keflex,  If the hives return we will stop the keflex

## 2017-05-26 ENCOUNTER — Encounter: Payer: Self-pay | Admitting: Internal Medicine

## 2017-05-26 ENCOUNTER — Ambulatory Visit (INDEPENDENT_AMBULATORY_CARE_PROVIDER_SITE_OTHER): Payer: Medicare Other | Admitting: Internal Medicine

## 2017-05-26 VITALS — BP 126/78 | HR 59 | Temp 98.3°F | Resp 16 | Ht 65.0 in | Wt 176.8 lb

## 2017-05-26 DIAGNOSIS — S8011XD Contusion of right lower leg, subsequent encounter: Secondary | ICD-10-CM

## 2017-05-26 DIAGNOSIS — L0292 Furuncle, unspecified: Secondary | ICD-10-CM | POA: Diagnosis not present

## 2017-05-26 DIAGNOSIS — L723 Sebaceous cyst: Secondary | ICD-10-CM

## 2017-05-26 DIAGNOSIS — R6883 Chills (without fever): Secondary | ICD-10-CM | POA: Diagnosis not present

## 2017-05-26 DIAGNOSIS — J0100 Acute maxillary sinusitis, unspecified: Secondary | ICD-10-CM | POA: Diagnosis not present

## 2017-05-26 DIAGNOSIS — L089 Local infection of the skin and subcutaneous tissue, unspecified: Secondary | ICD-10-CM | POA: Diagnosis not present

## 2017-05-27 ENCOUNTER — Encounter: Payer: Self-pay | Admitting: Internal Medicine

## 2017-05-27 LAB — CBC WITH DIFFERENTIAL/PLATELET
Basophils Absolute: 0 10*3/uL (ref 0.0–0.1)
Basophils Relative: 0.2 % (ref 0.0–3.0)
Eosinophils Absolute: 0.1 10*3/uL (ref 0.0–0.7)
Eosinophils Relative: 1 % (ref 0.0–5.0)
HCT: 40 % (ref 36.0–46.0)
Hemoglobin: 13.2 g/dL (ref 12.0–15.0)
Lymphocytes Relative: 29.8 % (ref 12.0–46.0)
Lymphs Abs: 3 10*3/uL (ref 0.7–4.0)
MCHC: 33 g/dL (ref 30.0–36.0)
MCV: 95.4 fl (ref 78.0–100.0)
Monocytes Absolute: 0.8 10*3/uL (ref 0.1–1.0)
Monocytes Relative: 8.2 % (ref 3.0–12.0)
Neutro Abs: 6.2 10*3/uL (ref 1.4–7.7)
Neutrophils Relative %: 60.8 % (ref 43.0–77.0)
Platelets: 376 10*3/uL (ref 150.0–400.0)
RBC: 4.19 Mil/uL (ref 3.87–5.11)
RDW: 13.3 % (ref 11.5–15.5)
WBC: 10.2 10*3/uL (ref 4.0–10.5)

## 2017-05-27 LAB — SEDIMENTATION RATE: Sed Rate: 10 mm/hr (ref 0–30)

## 2017-05-27 NOTE — Assessment & Plan Note (Signed)
Infected sebaceous cyst. Has not resolved. Refer to [facility] for incisicion and drainage.

## 2017-05-27 NOTE — Assessment & Plan Note (Signed)
Symptoms have resolved,

## 2017-05-27 NOTE — Progress Notes (Signed)
Subjective:  Patient ID: Angela Mendoza, female    DOB: 08-09-1951  Age: 66 y.o. MRN: [id number]  CC: The primary encounter diagnosis was Chills (without fever). Diagnoses of Acute non-recurrent maxillary sinusitis, Leg hematoma, right, subsequent encounter, Boil, and Infected sebaceous cyst were also pertinent to this visit.  HPI Angela Mendoza presents for follow up on abdominal wall boil and right lower leg hematoma .  She notes improvement in the hematoma  But is still concerned about the leg being warm and the persistent bruising noted in the heel.   Her abdominal wall  boil has diminished in size and is no longer draining.   Outpatient Medications Prior to Visit  Medication Sig Dispense Refill  . albuterol (PROVENTIL HFA) 108 (90 Base) MCG/ACT inhaler Inhale 2 puffs into the lungs every 6 (six) hours as needed. 3 Inhaler 3  . ALPRAZolam (XANAX) 0.25 MG tablet TAKE 1 TABLET (0.25 MG TOTAL) BY MOUTH ONCE DAILY AS NEEDED FOR SLEEP. 30 tablet 2  . aspirin 81 MG tablet Take 81 mg by mouth daily.      [name] atorvastatin (LIPITOR) 40 MG tablet Take 1 tablet (40 mg total) by mouth daily. 90 tablet 3  . Azelastine-Fluticasone (DYMISTA) 137-50 MCG/ACT SUSP Place 1 spray into the nose 2 (two) times daily. 69 g 3  . B Complex Vitamins (VITAMIN-B COMPLEX) TABS Take 1 tablet by mouth daily.    . beclomethasone (QVAR REDIHALER) 80 MCG/ACT inhaler Inhale 2 puffs into the lungs 2 (two) times daily. 3 Inhaler 3  . cephALEXin (KEFLEX) 500 MG capsule Take 1 capsule (500 mg total) by mouth 4 (four) times daily. 28 capsule 0  . cholecalciferol (VITAMIN D) 1000 units tablet Take 1,000 Units by mouth daily.     [name] conjugated estrogens (PREMARIN) vaginal cream Place 1 Applicatorful vaginally 2 (two) times a week. 90 day supply,  Refill for one year 90 g 3  . cyanocobalamin (,VITAMIN B-12,) 1000 MCG/ML injection Inject 1 mL (1,000 mcg total) into the muscle every 30 (thirty) days. 3 mL 3  . cycloSPORINE (RESTASIS)  0.05 % ophthalmic emulsion Place 1 drop into both eyes 2 (two) times daily. 3 each 3  . diazepam (VALIUM) 5 MG tablet TAKE 1 TABLET BY MOUTH EVERY 6 HOURS AS NEEDED FOR VERTIGO 30 tablet 1  . Docusate Calcium (STOOL SOFTENER PO) Take 1 tablet by mouth as needed.    [name] EPINEPHrine 0.3 mg/0.3 mL IJ SOAJ injection Inject 0.3 mLs (0.3 mg total) into the muscle once. 2 Device 0  . estradiol (VIVELLE-DOT) 0.1 MG/24HR patch Place 1 patch onto the skin 2 (two) times a week.    . famotidine (PEPCID) 20 MG tablet Take 20 mg by mouth daily as needed for heartburn or indigestion.    . fexofenadine (ALLEGRA ALLERGY) 180 MG tablet Take 1 tablet (180 mg total) by mouth daily. 90 tablet 3  . fluconazole (DIFLUCAN) 150 MG tablet Take 1 tablet (150 mg total) by mouth daily. 2 tablet 1  . Levothyroxine Sodium 50 MCG CAPS Take 1 capsule (50 mcg total) by mouth daily. 90 capsule 3  . Lifitegrast (XIIDRA) 5 % SOLN Apply 1 drop to eye 2 (two) times daily.    [name] losartan (COZAAR) 50 MG tablet Take 1 tablet (50 mg total) by mouth daily. 90 tablet 3  . meclizine (ANTIVERT) 25 MG tablet Take 1 tablet (25 mg total) by mouth 3 (three) times daily as needed. 270 tablet 1  . metoprolol  succinate (TOPROL-XL) 50 MG 24 hr tablet Take 1 tablet (50 mg total) by mouth daily. (Patient taking differently: Take 50 mg by mouth daily. ) 90 tablet 3  . montelukast (SINGULAIR) 10 MG tablet Take 1 tablet (10 mg total) by mouth at bedtime. 90 tablet 3  . omega-3 acid ethyl esters (LOVAZA) 1 g capsule Take 1 capsule (1 g total) by mouth daily. 90 capsule 3  . promethazine (PHENERGAN) 25 MG tablet Take 1 tablet (25 mg total) by mouth every 6 (six) hours as needed for nausea or vomiting. 30 tablet 3  . Propylene Glycol (SYSTANE BALANCE) 0.6 % SOLN Apply 1 drop to eye as needed.    . psyllium (METAMUCIL) 58.6 % powder Take 1 packet by mouth as needed.    [name] Spacer/Aero-Holding Chambers (AEROCHAMBER MV) inhaler Use as instructed 1 each 0  .  Syringe/Needle, Disp, (SYRINGE 3CC/20GX1") 20G X 1" 3 ML MISC 1 Syringe by Does not apply route every 30 (thirty) days. For use with b-12 injection monthly 50 each 0  . tretinoin (RETIN-A) 0.01 % gel Apply topically at bedtime. 45 g 0  . urea (CARMOL) 10 % cream Apply topically as needed. 90 g 0  . doxycycline (VIBRA-TABS) 100 MG tablet Take 1 tablet (100 mg total) by mouth 2 (two) times daily. (Patient not taking: Reported on 05/26/2017) 14 tablet 0  . fluconazole (DIFLUCAN) 100 MG tablet Take 1 tablet (100 mg total) by mouth daily. (Patient not taking: Reported on 05/26/2017) 7 tablet 0   No facility-administered medications prior to visit.     Review of Systems;  Patient denies headache, fevers, malaise, unintentional weight loss, skin rash, eye pain, sinus congestion and sinus pain, sore throat, dysphagia,  hemoptysis , cough, dyspnea, wheezing, chest pain, palpitations, orthopnea, edema, abdominal pain, nausea, melena, diarrhea, constipation, flank pain, dysuria, hematuria, urinary  Frequency, nocturia, numbness, tingling, seizures,  Focal weakness, Loss of consciousness,  Tremor, insomnia, depression, anxiety, and suicidal ideation.      Objective:  BP 126/78 (BP Location: Left Arm, Patient Position: Sitting, Cuff Size: Normal)   Pulse (!) 59   Temp 98.3 F (36.8 C) (Oral)   Resp 16   Ht 5\' 5"  (1.651 m)   Wt 176 lb 12.8 oz (80.2 kg)   SpO2 98%   BMI 29.42 kg/m   BP Readings from Last 3 Encounters:  05/26/17 126/78  05/20/17 138/80  05/14/17 122/82    Wt Readings from Last 3 Encounters:  05/26/17 176 lb 12.8 oz (80.2 kg)  05/20/17 177 lb 9.6 oz (80.6 kg)  05/14/17 174 lb 6.4 oz (79.1 kg)    General appearance: alert, cooperative and appears stated age Ears: normal TM's and external ear canals both ears Throat: lips, mucosa, and tongue normal; teeth and gums normal Neck: no adenopathy, no carotid bruit, supple, symmetrical, trachea midline and thyroid not enlarged,  symmetric, no tenderness/mass/nodules Back: symmetric, no curvature. ROM normal. No CVA tenderness. Lungs: clear to auscultation bilaterally Heart: regular rate and rhythm, S1, S2 normal, no murmur, click, rub or gallop Abdomen: quarter sized nonfluctant boil, soft, non-tender; bowel sounds normal; no masses,  no organomegaly Pulses: 2+ and symmetric Skin: right lel with anterior shin hematoma, smaller in circumference that previously seen , supraclavicular, and axillary nodes normal.  Lab Results  Component Value Date   HGBA1C 5.9 10/12/2015    Lab Results  Component Value Date   CREATININE 0.86 04/05/2015   CREATININE 0.9 08/17/2014   CREATININE 0.96 04/12/2013  Lab Results  Component Value Date   WBC 10.2 05/26/2017   HGB 13.2 05/26/2017   HCT 40.0 05/26/2017   PLT 376.0 05/26/2017   GLUCOSE 94 04/05/2015   CHOL 174 04/05/2015   TRIG 177.0 (H) 04/05/2015   HDL 54.10 04/05/2015   LDLDIRECT 84.0 04/05/2015   LDLCALC 85 04/05/2015   ALT 20 04/05/2015   AST 16 04/05/2015   NA 138 04/05/2015   K 4.4 04/05/2015   CL 103 04/05/2015   CREATININE 0.86 04/05/2015   BUN 9 04/05/2015   CO2 28 04/05/2015   TSH 2.41 04/05/2015   HGBA1C 5.9 10/12/2015    Dg Tibia/fibula Right  Result Date: 05/14/2017 CLINICAL DATA:  Recent fall.  Leg hematoma and pain EXAM: RIGHT TIBIA AND FIBULA - 2 VIEW COMPARISON:  None. FINDINGS: There is no evidence of fracture or other focal bone lesions. Soft tissues are unremarkable. IMPRESSION: Negative. Electronically Signed   By: Franchot Gallo M.D.   On: 05/14/2017 15:15   Dg Ankle Complete Right  Result Date: 05/14/2017 CLINICAL DATA:  Fall.  Leg hematoma and pain EXAM: RIGHT ANKLE - COMPLETE 3+ VIEW COMPARISON:  None. FINDINGS: There is no evidence of fracture, dislocation, or joint effusion. There is no evidence of arthropathy or other focal bone abnormality. Soft tissues are unremarkable. IMPRESSION: Negative. Electronically Signed   By:  Franchot Gallo M.D.   On: 05/14/2017 15:16   Ct Head Wo Contrast  Result Date: 05/14/2017 CLINICAL DATA:  Head injury initial encounter. Fell down stairs 1 week ago EXAM: CT HEAD WITHOUT CONTRAST TECHNIQUE: Contiguous axial images were obtained from the base of the skull through the vertex without intravenous contrast. COMPARISON:  None. FINDINGS: Brain: No evidence of acute infarction, hemorrhage, hydrocephalus, extra-axial collection or mass lesion/mass effect. Vascular: No hyperdense vessel or unexpected calcification. Skull: Negative Sinuses/Orbits: Negative Other: None IMPRESSION: Negative CT head These results will be called to the ordering clinician or representative by the Radiologist Assistant, and communication documented in the PACS or zVision Dashboard. Electronically Signed   By: Franchot Gallo M.D.   On: 05/14/2017 13:35   US Venous Img Lower Unilateral Right  Result Date: 05/14/2017 CLINICAL DATA:  Right lower extremity swelling. EXAM: RIGHT LOWER EXTREMITY VENOUS DOPPLER ULTRASOUND TECHNIQUE: Gray-scale sonography with graded compression, as well as color Doppler and duplex ultrasound were performed to evaluate the lower extremity deep venous systems from the level of the common femoral vein and including the common femoral, femoral, profunda femoral, popliteal and calf veins including the posterior tibial, peroneal and gastrocnemius veins when visible. The superficial great saphenous vein was also interrogated. Spectral Doppler was utilized to evaluate flow at rest and with distal augmentation maneuvers in the common femoral, femoral and popliteal veins. COMPARISON:  None. FINDINGS: Contralateral Common Femoral Vein: Respiratory phasicity is normal and symmetric with the symptomatic side. No evidence of thrombus. Normal compressibility. Common Femoral Vein: No evidence of thrombus. Normal compressibility, respiratory phasicity and response to augmentation. Saphenofemoral Junction: No  evidence of thrombus. Normal compressibility and flow on color Doppler imaging. Profunda Femoral Vein: No evidence of thrombus. Normal compressibility and flow on color Doppler imaging. Femoral Vein: No evidence of thrombus. Normal compressibility, respiratory phasicity and response to augmentation. Popliteal Vein: No evidence of thrombus. Normal compressibility, respiratory phasicity and response to augmentation. Calf Veins: No evidence of thrombus. Normal compressibility and flow on color Doppler imaging. Superficial Great Saphenous Vein: No evidence of thrombus. Normal compressibility and flow on color Doppler imaging. Other Findings:  None. IMPRESSION: No evidence of DVT within the right lower extremity. Electronically Signed   By: Marcello Moores  Register   On: 05/14/2017 14:53    Assessment & Plan:   Problem List Items Addressed This Visit    Sinusitis, acute maxillary    Symptoms have resolved,       Leg hematoma, right, subsequent encounter    Improving gradually.  Continuing  icing and elevation       Infected sebaceous cyst    Infected sebaceous cyst. Has not resolved. Refer to [facility] for incisicion and drainage.       Relevant Orders   Ambulatory referral to General Surgery    Other Visit Diagnoses    Chills (without fever)    -  Primary   Relevant Orders   CBC with Differential/Platelet (Completed)   Sedimentation rate (Completed)      I have discontinued Ms. Spanbauer's doxycycline. I am also having her maintain her aspirin, Propylene Glycol, psyllium, Docusate Calcium (STOOL SOFTENER PO), fexofenadine, tretinoin, urea, EPINEPHrine, cholecalciferol, famotidine, Vitamin-B Complex, diazepam, albuterol, atorvastatin, Azelastine-Fluticasone, conjugated estrogens, cyanocobalamin, cycloSPORINE, Levothyroxine Sodium, meclizine, metoprolol succinate, montelukast, promethazine, SYRINGE 3CC/20GX1", estradiol, AEROCHAMBER MV, beclomethasone, Lifitegrast, ALPRAZolam, fluconazole, omega-3 acid  ethyl esters, losartan, and cephALEXin.  No orders of the defined types were placed in this encounter.   Medications Discontinued During This Encounter  Medication Reason  . doxycycline (VIBRA-TABS) 100 MG tablet Patient has not taken in last 30 days  . fluconazole (DIFLUCAN) 100 MG tablet Patient has not taken in last 30 days    Follow-up: Return in about 1 week (around 06/02/2017) for [address] [place] .   Crecencio Mc, MD

## 2017-05-27 NOTE — Assessment & Plan Note (Signed)
Improving gradually.  Continuing  icing and elevation

## 2017-05-28 ENCOUNTER — Telehealth: Payer: Self-pay | Admitting: General Surgery

## 2017-05-28 NOTE — Telephone Encounter (Signed)
LEFT MESSAGE FOR PATIENT TO CALL AN SCHEDULE AN APPOINTMENT WITH DR Jamal Collin.REFERRED BY DR Derrel Nip INFECTED SEBACEOUS CYST ON ABD WALL,INPROVING NOT ON ANTIBIOTICS

## 2017-06-03 ENCOUNTER — Ambulatory Visit: Payer: Medicare Other | Admitting: Internal Medicine

## 2017-06-08 ENCOUNTER — Encounter: Payer: Self-pay | Admitting: Internal Medicine

## 2017-06-08 ENCOUNTER — Ambulatory Visit (INDEPENDENT_AMBULATORY_CARE_PROVIDER_SITE_OTHER): Payer: Medicare Other | Admitting: Internal Medicine

## 2017-06-08 DIAGNOSIS — L723 Sebaceous cyst: Secondary | ICD-10-CM | POA: Diagnosis not present

## 2017-06-08 DIAGNOSIS — L089 Local infection of the skin and subcutaneous tissue, unspecified: Secondary | ICD-10-CM

## 2017-06-08 DIAGNOSIS — M65341 Trigger finger, right ring finger: Secondary | ICD-10-CM | POA: Diagnosis not present

## 2017-06-08 DIAGNOSIS — S8011XD Contusion of right lower leg, subsequent encounter: Secondary | ICD-10-CM

## 2017-06-08 NOTE — Progress Notes (Signed)
Subjective:  Patient ID: Angela Mendoza, female    DOB: 1950/11/11  Age: 66 y.o. MRN: [id number]  CC: Diagnoses of Leg hematoma, right, subsequent encounter, Infected sebaceous cyst, and Trigger finger, right ring finger were pertinent to this visit.  HPI [name] presents for recheck on wounds.  She had a signicant bone bruise to lower right leg with ecchymosis,  Swelling and pain that is finally  improving,  Swelling has resolved and np longer painful .  Infected sebaceous cyst on abdominal wall under left bresrt now resolved.  Does not need to have it incised.    2) she has been having increased resistance in extending the ring finger on her right hand for several months.  Was told several years ago by an ER doctro that she had injured the tendon,  But no history of fracture or recent injury.  (x rays from Jun 2014 available in chart)   Outpatient Medications Prior to Visit  Medication Sig Dispense Refill  . albuterol (PROVENTIL HFA) 108 (90 Base) MCG/ACT inhaler Inhale 2 puffs into the lungs every 6 (six) hours as needed. 3 Inhaler 3  . ALPRAZolam (XANAX) 0.25 MG tablet TAKE 1 TABLET (0.25 MG TOTAL) BY MOUTH ONCE DAILY AS NEEDED FOR SLEEP. 30 tablet 2  . aspirin 81 MG tablet Take 81 mg by mouth daily.      [name] atorvastatin (LIPITOR) 40 MG tablet Take 1 tablet (40 mg total) by mouth daily. 90 tablet 3  . Azelastine-Fluticasone (DYMISTA) 137-50 MCG/ACT SUSP Place 1 spray into the nose 2 (two) times daily. 69 g 3  . B Complex Vitamins (VITAMIN-B COMPLEX) TABS Take 1 tablet by mouth daily.    . beclomethasone (QVAR REDIHALER) 80 MCG/ACT inhaler Inhale 2 puffs into the lungs 2 (two) times daily. 3 Inhaler 3  . cholecalciferol (VITAMIN D) 1000 units tablet Take 1,000 Units by mouth daily.     [name] conjugated estrogens (PREMARIN) vaginal cream Place 1 Applicatorful vaginally 2 (two) times a week. 90 day supply,  Refill for one year 90 g 3  . cyanocobalamin (,VITAMIN B-12,) 1000 MCG/ML  injection Inject 1 mL (1,000 mcg total) into the muscle every 30 (thirty) days. 3 mL 3  . cycloSPORINE (RESTASIS) 0.05 % ophthalmic emulsion Place 1 drop into both eyes 2 (two) times daily. 3 each 3  . diazepam (VALIUM) 5 MG tablet TAKE 1 TABLET BY MOUTH EVERY 6 HOURS AS NEEDED FOR VERTIGO 30 tablet 1  . Docusate Calcium (STOOL SOFTENER PO) Take 1 tablet by mouth as needed.    [name] EPINEPHrine 0.3 mg/0.3 mL IJ SOAJ injection Inject 0.3 mLs (0.3 mg total) into the muscle once. 2 Device 0  . estradiol (VIVELLE-DOT) 0.1 MG/24HR patch Place 1 patch onto the skin 2 (two) times a week.    . famotidine (PEPCID) 20 MG tablet Take 20 mg by mouth daily as needed for heartburn or indigestion.    . fexofenadine (ALLEGRA ALLERGY) 180 MG tablet Take 1 tablet (180 mg total) by mouth daily. 90 tablet 3  . fluconazole (DIFLUCAN) 150 MG tablet Take 1 tablet (150 mg total) by mouth daily. 2 tablet 1  . Levothyroxine Sodium 50 MCG CAPS Take 1 capsule (50 mcg total) by mouth daily. 90 capsule 3  . Lifitegrast (XIIDRA) 5 % SOLN Apply 1 drop to eye 2 (two) times daily.    [name] losartan (COZAAR) 50 MG tablet Take 1 tablet (50 mg total) by mouth daily. 90 tablet 3  .  meclizine (ANTIVERT) 25 MG tablet Take 1 tablet (25 mg total) by mouth 3 (three) times daily as needed. 270 tablet 1  . metoprolol succinate (TOPROL-XL) 50 MG 24 hr tablet Take 1 tablet (50 mg total) by mouth daily. (Patient taking differently: Take 50 mg by mouth daily. ) 90 tablet 3  . montelukast (SINGULAIR) 10 MG tablet Take 1 tablet (10 mg total) by mouth at bedtime. 90 tablet 3  . omega-3 acid ethyl esters (LOVAZA) 1 g capsule Take 1 capsule (1 g total) by mouth daily. 90 capsule 3  . promethazine (PHENERGAN) 25 MG tablet Take 1 tablet (25 mg total) by mouth every 6 (six) hours as needed for nausea or vomiting. 30 tablet 3  . Propylene Glycol (SYSTANE BALANCE) 0.6 % SOLN Apply 1 drop to eye as needed.    . psyllium (METAMUCIL) 58.6 % powder Take 1 packet by  mouth as needed.    [name] Spacer/Aero-Holding Chambers (AEROCHAMBER MV) inhaler Use as instructed 1 each 0  . Syringe/Needle, Disp, (SYRINGE 3CC/20GX1") 20G X 1" 3 ML MISC 1 Syringe by Does not apply route every 30 (thirty) days. For use with b-12 injection monthly 50 each 0  . tretinoin (RETIN-A) 0.01 % gel Apply topically at bedtime. 45 g 0  . urea (CARMOL) 10 % cream Apply topically as needed. 90 g 0  . cephALEXin (KEFLEX) 500 MG capsule Take 1 capsule (500 mg total) by mouth 4 (four) times daily. (Patient not taking: Reported on 06/08/2017) 28 capsule 0   No facility-administered medications prior to visit.     Review of Systems;  Patient denies headache, fevers, malaise, unintentional weight loss, skin rash, eye pain, sinus congestion and sinus pain, sore throat, dysphagia,  hemoptysis , cough, dyspnea, wheezing, chest pain, palpitations, orthopnea, edema, abdominal pain, nausea, melena, diarrhea, constipation, flank pain, dysuria, hematuria, urinary  Frequency, nocturia, numbness, tingling, seizures,  Focal weakness, Loss of consciousness,  Tremor, insomnia, depression, anxiety, and suicidal ideation.      Objective:  BP 116/62 (BP Location: Left Arm, Patient Position: Sitting, Cuff Size: Normal)   Pulse 60   Temp 98.1 F (36.7 C) (Oral)   Resp 17   Ht 5\' 5"  (1.651 m)   Wt 175 lb 9.6 oz (79.7 kg)   SpO2 98%   BMI 29.22 kg/m   BP Readings from Last 3 Encounters:  06/08/17 116/62  05/26/17 126/78  05/20/17 138/80    Wt Readings from Last 3 Encounters:  06/08/17 175 lb 9.6 oz (79.7 kg)  05/26/17 176 lb 12.8 oz (80.2 kg)  05/20/17 177 lb 9.6 oz (80.6 kg)    General appearance: alert, cooperative and appears stated age Ears: normal TM's and external ear canals both ears Throat: lips, mucosa, and tongue normal; teeth and gums normal Neck: no adenopathy, no carotid bruit, supple, symmetrical, trachea midline and thyroid not enlarged, symmetric, no tenderness/mass/nodules Back:  symmetric, no curvature. ROM normal. No CVA tenderness. Lungs: clear to auscultation bilaterally Heart: regular rate and rhythm, S1, S2 normal, no murmur, click, rub or gallop Abdomen: soft, non-tender; bowel sounds normal; no masses,  no organomegaly Pulses: 2+ and symmetric Skin: Skin color, texture, turgor normal. No rashes or lesions Lymph nodes: Cervical, supraclavicular, and axillary nodes normal.  Lab Results  Component Value Date   HGBA1C 5.9 10/12/2015    Lab Results  Component Value Date   CREATININE 0.86 04/05/2015   CREATININE 0.9 08/17/2014   CREATININE 0.96 04/12/2013    Lab Results  Component  Value Date   WBC 10.2 05/26/2017   HGB 13.2 05/26/2017   HCT 40.0 05/26/2017   PLT 376.0 05/26/2017   GLUCOSE 94 04/05/2015   CHOL 174 04/05/2015   TRIG 177.0 (H) 04/05/2015   HDL 54.10 04/05/2015   LDLDIRECT 84.0 04/05/2015   LDLCALC 85 04/05/2015   ALT 20 04/05/2015   AST 16 04/05/2015   NA 138 04/05/2015   K 4.4 04/05/2015   CL 103 04/05/2015   CREATININE 0.86 04/05/2015   BUN 9 04/05/2015   CO2 28 04/05/2015   TSH 2.41 04/05/2015   HGBA1C 5.9 10/12/2015    Dg Tibia/fibula Right  Result Date: 05/14/2017 CLINICAL DATA:  Recent fall.  Leg hematoma and pain EXAM: RIGHT TIBIA AND FIBULA - 2 VIEW COMPARISON:  None. FINDINGS: There is no evidence of fracture or other focal bone lesions. Soft tissues are unremarkable. IMPRESSION: Negative. Electronically Signed   By: Franchot Gallo M.D.   On: 05/14/2017 15:15   Dg Ankle Complete Right  Result Date: 05/14/2017 CLINICAL DATA:  Fall.  Leg hematoma and pain EXAM: RIGHT ANKLE - COMPLETE 3+ VIEW COMPARISON:  None. FINDINGS: There is no evidence of fracture, dislocation, or joint effusion. There is no evidence of arthropathy or other focal bone abnormality. Soft tissues are unremarkable. IMPRESSION: Negative. Electronically Signed   By: Franchot Gallo M.D.   On: 05/14/2017 15:16   Ct Head Wo Contrast  Result Date:  05/14/2017 CLINICAL DATA:  Head injury initial encounter. Fell down stairs 1 week ago EXAM: CT HEAD WITHOUT CONTRAST TECHNIQUE: Contiguous axial images were obtained from the base of the skull through the vertex without intravenous contrast. COMPARISON:  None. FINDINGS: Brain: No evidence of acute infarction, hemorrhage, hydrocephalus, extra-axial collection or mass lesion/mass effect. Vascular: No hyperdense vessel or unexpected calcification. Skull: Negative Sinuses/Orbits: Negative Other: None IMPRESSION: Negative CT head These results will be called to the ordering clinician or representative by the Radiologist Assistant, and communication documented in the PACS or zVision Dashboard. Electronically Signed   By: Franchot Gallo M.D.   On: 05/14/2017 13:35   US Venous Img Lower Unilateral Right  Result Date: 05/14/2017 CLINICAL DATA:  Right lower extremity swelling. EXAM: RIGHT LOWER EXTREMITY VENOUS DOPPLER ULTRASOUND TECHNIQUE: Gray-scale sonography with graded compression, as well as color Doppler and duplex ultrasound were performed to evaluate the lower extremity deep venous systems from the level of the common femoral vein and including the common femoral, femoral, profunda femoral, popliteal and calf veins including the posterior tibial, peroneal and gastrocnemius veins when visible. The superficial great saphenous vein was also interrogated. Spectral Doppler was utilized to evaluate flow at rest and with distal augmentation maneuvers in the common femoral, femoral and popliteal veins. COMPARISON:  None. FINDINGS: Contralateral Common Femoral Vein: Respiratory phasicity is normal and symmetric with the symptomatic side. No evidence of thrombus. Normal compressibility. Common Femoral Vein: No evidence of thrombus. Normal compressibility, respiratory phasicity and response to augmentation. Saphenofemoral Junction: No evidence of thrombus. Normal compressibility and flow on color Doppler imaging. Profunda  Femoral Vein: No evidence of thrombus. Normal compressibility and flow on color Doppler imaging. Femoral Vein: No evidence of thrombus. Normal compressibility, respiratory phasicity and response to augmentation. Popliteal Vein: No evidence of thrombus. Normal compressibility, respiratory phasicity and response to augmentation. Calf Veins: No evidence of thrombus. Normal compressibility and flow on color Doppler imaging. Superficial Great Saphenous Vein: No evidence of thrombus. Normal compressibility and flow on color Doppler imaging. Other Findings:  None. IMPRESSION: No  evidence of DVT within the right lower extremity. Electronically Signed   By: Marcello Moores  Register   On: 05/14/2017 14:53    Assessment & Plan:   Problem List Items Addressed This Visit    Trigger finger, right ring finger    Early,  Suggested by history and exam..  Will defer ortho eval for now per patient preference.  Recommend icing it prn and avoid overuse.       Leg hematoma, right, subsequent encounter    Improving gradually.  No longer swollen,  Can dc  icing and elevation       Infected sebaceous cyst    All swelling and redness has resolved  She has deferred dermatology appt.  Continue daily antiseptic cleaning of area to avoid reinfection          I have discontinued Ms. Lewellyn's cephALEXin. I am also having her maintain her aspirin, Propylene Glycol, psyllium, Docusate Calcium (STOOL SOFTENER PO), fexofenadine, tretinoin, urea, EPINEPHrine, cholecalciferol, famotidine, Vitamin-B Complex, diazepam, albuterol, atorvastatin, Azelastine-Fluticasone, conjugated estrogens, cyanocobalamin, cycloSPORINE, Levothyroxine Sodium, meclizine, metoprolol succinate, montelukast, promethazine, SYRINGE 3CC/20GX1", estradiol, AEROCHAMBER MV, beclomethasone, Lifitegrast, ALPRAZolam, fluconazole, omega-3 acid ethyl esters, and losartan.  No orders of the defined types were placed in this encounter.   Medications Discontinued During This  Encounter  Medication Reason  . cephALEXin (KEFLEX) 500 MG capsule Completed Course    Follow-up: No Follow-up on file.   Crecencio Mc, MD

## 2017-06-09 DIAGNOSIS — M65341 Trigger finger, right ring finger: Secondary | ICD-10-CM | POA: Insufficient documentation

## 2017-06-09 NOTE — Assessment & Plan Note (Signed)
Early,  Suggested by history and exam..  Will defer ortho eval for now per patient preference.  Recommend icing it prn and avoid overuse.

## 2017-06-09 NOTE — Assessment & Plan Note (Signed)
Improving gradually.  No longer swollen,  Can dc  icing and elevation

## 2017-06-09 NOTE — Assessment & Plan Note (Signed)
All swelling and redness has resolved  She has deferred dermatology appt.  Continue daily antiseptic cleaning of area to avoid reinfection

## 2017-06-10 NOTE — Telephone Encounter (Signed)
Mailed unread message to patient.  

## 2017-07-06 ENCOUNTER — Encounter: Payer: Self-pay | Admitting: Internal Medicine

## 2017-07-07 MED ORDER — PROMETHAZINE HCL 25 MG PO TABS: 25.0000 mg | ORAL_TABLET | Freq: Four times a day (QID) | ORAL | 3 refills | Status: DC | PRN

## 2017-07-07 MED ORDER — METOPROLOL SUCCINATE ER 25 MG PO TB24: 75.0000 mg | ORAL_TABLET | Freq: Every day | ORAL | 3 refills | Status: DC

## 2017-07-21 ENCOUNTER — Encounter: Payer: Self-pay | Admitting: Internal Medicine

## 2017-07-22 ENCOUNTER — Ambulatory Visit (INDEPENDENT_AMBULATORY_CARE_PROVIDER_SITE_OTHER): Payer: Medicare Other

## 2017-07-22 DIAGNOSIS — Z23 Encounter for immunization: Secondary | ICD-10-CM

## 2017-07-22 MED ORDER — METOPROLOL SUCCINATE ER 25 MG PO TB24: 75.0000 mg | ORAL_TABLET | Freq: Every day | ORAL | 3 refills | Status: DC

## 2017-08-04 ENCOUNTER — Ambulatory Visit (INDEPENDENT_AMBULATORY_CARE_PROVIDER_SITE_OTHER): Payer: Medicare Other | Admitting: Pulmonary Disease

## 2017-08-04 ENCOUNTER — Encounter: Payer: Self-pay | Admitting: Pulmonary Disease

## 2017-08-04 VITALS — BP 114/76 | HR 61 | Ht 64.0 in | Wt 176.0 lb

## 2017-08-04 DIAGNOSIS — J3089 Other allergic rhinitis: Secondary | ICD-10-CM

## 2017-08-04 DIAGNOSIS — R058 Other specified cough: Secondary | ICD-10-CM

## 2017-08-04 DIAGNOSIS — G4733 Obstructive sleep apnea (adult) (pediatric): Secondary | ICD-10-CM

## 2017-08-04 DIAGNOSIS — F458 Other somatoform disorders: Secondary | ICD-10-CM

## 2017-08-04 DIAGNOSIS — R05 Cough: Secondary | ICD-10-CM | POA: Diagnosis not present

## 2017-08-04 DIAGNOSIS — J302 Other seasonal allergic rhinitis: Secondary | ICD-10-CM

## 2017-08-04 DIAGNOSIS — Z9989 Dependence on other enabling machines and devices: Secondary | ICD-10-CM

## 2017-08-04 DIAGNOSIS — J4530 Mild persistent asthma, uncomplicated: Secondary | ICD-10-CM | POA: Diagnosis not present

## 2017-08-04 NOTE — Progress Notes (Signed)
Current Outpatient Prescriptions on File Prior to Visit  Medication Sig  . albuterol (PROVENTIL HFA) 108 (90 Base) MCG/ACT inhaler Inhale 2 puffs into the lungs every 6 (six) hours as needed.  . ALPRAZolam (XANAX) 0.25 MG tablet TAKE 1 TABLET (0.25 MG TOTAL) BY MOUTH ONCE DAILY AS NEEDED FOR SLEEP.  [name] aspirin 81 MG tablet Take 81 mg by mouth daily.    [name] atorvastatin (LIPITOR) 40 MG tablet Take 1 tablet (40 mg total) by mouth daily.  . Azelastine-Fluticasone (DYMISTA) 137-50 MCG/ACT SUSP Place 1 spray into the nose 2 (two) times daily.  . B Complex Vitamins (VITAMIN-B COMPLEX) TABS Take 1 tablet by mouth daily.  . beclomethasone (QVAR REDIHALER) 80 MCG/ACT inhaler Inhale 2 puffs into the lungs 2 (two) times daily.  . cholecalciferol (VITAMIN D) 1000 units tablet Take 1,000 Units by mouth daily.   [name] conjugated estrogens (PREMARIN) vaginal cream Place 1 Applicatorful vaginally 2 (two) times a week. 90 day supply,  Refill for one year  . cyanocobalamin (,VITAMIN B-12,) 1000 MCG/ML injection Inject 1 mL (1,000 mcg total) into the muscle every 30 (thirty) days.  . cycloSPORINE (RESTASIS) 0.05 % ophthalmic emulsion Place 1 drop into both eyes 2 (two) times daily.  . diazepam (VALIUM) 5 MG tablet TAKE 1 TABLET BY MOUTH EVERY 6 HOURS AS NEEDED FOR VERTIGO  . Docusate Calcium (STOOL SOFTENER PO) Take 1 tablet by mouth as needed.  [name] EPINEPHrine 0.3 mg/0.3 mL IJ SOAJ injection Inject 0.3 mLs (0.3 mg total) into the muscle once.  [name] estradiol (VIVELLE-DOT) 0.1 MG/24HR patch Place 1 patch onto the skin 2 (two) times a week.  . famotidine (PEPCID) 20 MG tablet Take 20 mg by mouth daily as needed for heartburn or indigestion.  . fexofenadine (ALLEGRA ALLERGY) 180 MG tablet Take 1 tablet (180 mg total) by mouth daily.  . fluconazole (DIFLUCAN) 150 MG tablet Take 1 tablet (150 mg total) by mouth daily.  . Levothyroxine Sodium 50 MCG CAPS Take 1 capsule (50 mcg total) by mouth daily.  [name] Lifitegrast (XIIDRA) 5 %  SOLN Apply 1 drop to eye 2 (two) times daily.  [name] losartan (COZAAR) 50 MG tablet Take 1 tablet (50 mg total) by mouth daily.  . meclizine (ANTIVERT) 25 MG tablet Take 1 tablet (25 mg total) by mouth 3 (three) times daily as needed.  . metoprolol succinate (TOPROL-XL) 25 MG 24 hr tablet Take 3 tablets (75 mg total) by mouth daily.  . montelukast (SINGULAIR) 10 MG tablet Take 1 tablet (10 mg total) by mouth at bedtime.  [name] omega-3 acid ethyl esters (LOVAZA) 1 g capsule Take 1 capsule (1 g total) by mouth daily.  . promethazine (PHENERGAN) 25 MG tablet Take 1 tablet (25 mg total) by mouth every 6 (six) hours as needed for nausea or vomiting.  [name] Propylene Glycol (SYSTANE BALANCE) 0.6 % SOLN Apply 1 drop to eye as needed.  . psyllium (METAMUCIL) 58.6 % powder Take 1 packet by mouth as needed.  [name] Spacer/Aero-Holding Chambers (AEROCHAMBER MV) inhaler Use as instructed  . Syringe/Needle, Disp, (SYRINGE 3CC/20GX1") 20G X 1" 3 ML MISC 1 Syringe by Does not apply route every 30 (thirty) days. For use with b-12 injection monthly  . tretinoin (RETIN-A) 0.01 % gel Apply topically at bedtime.  . urea (CARMOL) 10 % cream Apply topically as needed.   No current facility-administered medications on file prior to visit.      Chief Complaint  Patient presents with  . Follow-up  Pt using CPAP each night, would like to know if settings can be lowered today. Pt is having for last week postnasal drip, productive cough, sinus pressure and rattle feeling when coughing with chest tightness.     Sleep tests PSG 09/06/07 >> AHI 19 CPAP 07/03/17 to 08/01/17 >> used on 30 of 30 nights with average 7 hrs 54 min.  Average AHI 0.6 with CPAP 13 cm H2O  Pulmonary tests Spirometry 12/12 >> FEV1% 68 RAST 11/15/14 >> IgE 30, cats/mold PFT 02/07/16 >> FEV1 2.32 (92%), FEV1% 76, FEF 25-75% 1.56 (71%), TLC 4.25 (81%), DLCO 121, borderline BD from FEF 25-75  Past medical history  HTN, HLD, HA, Depression, GERD, Hypothyroidism,  Vertigo, Diverticulosis, Vit D deficiency, Pernicious anemia  Past surgical history, Family history, Social history, Allergies reviewed  Vital Signs BP 114/76 (BP Location: Left Arm, Cuff Size: Normal)   Pulse 61   Ht 5\' 4"  (1.626 m)   Wt 176 lb (79.8 kg)   SpO2 99%   BMI 30.21 kg/m   History of Present Illness Angela Mendoza is a 66 y.o. female former smoker with OSA and asthma.  She feels CPAP pressure is too high.  She is getting bloating and belching.  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  Developed sinus congestion, post nasal drip, and increased cough.  Bringing up clear sputum.  Not having wheeze.  Has mild chest tightness.  No fever.  Saw her dentist who thought she might have thrush.  Prescribed clortimazole, but she hasn't started taking yet.  She asked about whether she should continue aspirin.  She gets frequent bruises on her arms and legs.  Physical Exam  General - pleasant Eyes - pupils reactive ENT - no sinus tenderness, clear nasal drainage, no oral exudate, no LAN Cardiac - regular, no murmur Chest - faint crackles Lt upper lung field that clear with coughing Abd - soft, non tender Ext - no edema Skin - ecchymosis on Rt forearm Neuro - normal strength Psych - normal mood   Assessment/Plan  Obstructive sleep apnea. - she reports compliance and benefit - she is having aerophagia - will change her to CPAP 11 cm H2O  Asthma. - she has mild flare likely from environmental exposure - don't think she needs prednisone, Abx, CXR or labs at this time - advised her to use albuterol on regular basis bid for next two day - continue Qvar and singulair  Allergic rhinitis. - continue dymista, singulair and prn allegra  Possible thrush. - I did not appreciate findings to suggest this at this time - advised she did not need treatment at this time - discussed importance of rinsing her mouth after using Qvar  Primary prevention of CAD and CVA. -  advised her to d/w her PCP about whether the benefit of daily aspirin therapy outweighs the risk of bleeding   Patient Instructions  Will change CPAP to 11 cm H2O  Try using albuterol twice per day for the next two days  Follow up in 1 year    Chesley Mires, MD [facility] Pager:  [phone] 08/04/2017, 11:42 AM

## 2017-08-04 NOTE — Patient Instructions (Signed)
Will change CPAP to 11 cm H2O  Try using albuterol twice per day for the next two days  Follow up in 1 year

## 2017-08-14 ENCOUNTER — Telehealth: Payer: Self-pay | Admitting: Internal Medicine

## 2017-08-14 ENCOUNTER — Ambulatory Visit (INDEPENDENT_AMBULATORY_CARE_PROVIDER_SITE_OTHER): Payer: Medicare Other | Admitting: Internal Medicine

## 2017-08-14 ENCOUNTER — Encounter: Payer: Self-pay | Admitting: Internal Medicine

## 2017-08-14 VITALS — BP 130/78 | HR 73 | Temp 97.9°F | Resp 18 | Wt 179.4 lb

## 2017-08-14 DIAGNOSIS — R300 Dysuria: Secondary | ICD-10-CM

## 2017-08-14 DIAGNOSIS — N3090 Cystitis, unspecified without hematuria: Secondary | ICD-10-CM | POA: Diagnosis not present

## 2017-08-14 LAB — POCT URINALYSIS DIPSTICK
Bilirubin, UA: NEGATIVE
Glucose, UA: NEGATIVE
Ketones, UA: NEGATIVE
Nitrite, UA: NEGATIVE
Protein, UA: NEGATIVE
Spec Grav, UA: <=1.005 — AB (ref 1.010–1.025)
Urobilinogen, UA: 0.2 E.U./dL
pH, UA: 5.5 (ref 5.0–8.0)

## 2017-08-14 LAB — URINALYSIS, MICROSCOPIC ONLY

## 2017-08-14 MED ORDER — CIPROFLOXACIN HCL 250 MG PO TABS: 250.0000 mg | ORAL_TABLET | Freq: Two times a day (BID) | ORAL | 0 refills | Status: DC

## 2017-08-14 MED ORDER — ONDANSETRON 4 MG PO TBDP: 4.0000 mg | ORAL_TABLET | Freq: Three times a day (TID) | ORAL | 0 refills | Status: DC | PRN

## 2017-08-14 NOTE — Telephone Encounter (Signed)
Error

## 2017-08-14 NOTE — Patient Instructions (Signed)
Cipro and Zofran sent to [facility]   Try AZO for the burning

## 2017-08-14 NOTE — Progress Notes (Signed)
Subjective:  Patient ID: Angela Mendoza, female    DOB: Aug 10, 1951  Age: 66 y.o. MRN: [id number]  CC: The primary encounter diagnosis was Dysuria. A diagnosis of Cystitis was also pertinent to this visit.  HPI Angela Mendoza presents for  Evaluation of urinary urgency , malaise,  And subjective fevers.  Patient states that she has felt run down and tired for several days but has been distracted by the full time care of her convalescing husband so she has been neglecting herself.  Started having dysuria yesterday.   Denies vomiting but has had mild  nausea, no fevers or back pain  Outpatient Medications Prior to Visit  Medication Sig Dispense Refill  . albuterol (PROVENTIL HFA) 108 (90 Base) MCG/ACT inhaler Inhale 2 puffs into the lungs every 6 (six) hours as needed. 3 Inhaler 3  . ALPRAZolam (XANAX) 0.25 MG tablet TAKE 1 TABLET (0.25 MG TOTAL) BY MOUTH ONCE DAILY AS NEEDED FOR SLEEP. 30 tablet 2  . aspirin 81 MG tablet Take 81 mg by mouth daily.      [name] atorvastatin (LIPITOR) 40 MG tablet Take 1 tablet (40 mg total) by mouth daily. 90 tablet 3  . Azelastine-Fluticasone (DYMISTA) 137-50 MCG/ACT SUSP Place 1 spray into the nose 2 (two) times daily. 69 g 3  . B Complex Vitamins (VITAMIN-B COMPLEX) TABS Take 1 tablet by mouth daily.    . beclomethasone (QVAR REDIHALER) 80 MCG/ACT inhaler Inhale 2 puffs into the lungs 2 (two) times daily. 3 Inhaler 3  . cholecalciferol (VITAMIN D) 1000 units tablet Take 1,000 Units by mouth daily.     [name] conjugated estrogens (PREMARIN) vaginal cream Place 1 Applicatorful vaginally 2 (two) times a week. 90 day supply,  Refill for one year 90 g 3  . cyanocobalamin (,VITAMIN B-12,) 1000 MCG/ML injection Inject 1 mL (1,000 mcg total) into the muscle every 30 (thirty) days. 3 mL 3  . cycloSPORINE (RESTASIS) 0.05 % ophthalmic emulsion Place 1 drop into both eyes 2 (two) times daily. 3 each 3  . diazepam (VALIUM) 5 MG tablet TAKE 1 TABLET BY MOUTH EVERY 6 HOURS AS  NEEDED FOR VERTIGO 30 tablet 1  . Docusate Calcium (STOOL SOFTENER PO) Take 1 tablet by mouth as needed.    [name] EPINEPHrine 0.3 mg/0.3 mL IJ SOAJ injection Inject 0.3 mLs (0.3 mg total) into the muscle once. 2 Device 0  . estradiol (VIVELLE-DOT) 0.1 MG/24HR patch Place 1 patch onto the skin 2 (two) times a week.    . famotidine (PEPCID) 20 MG tablet Take 20 mg by mouth daily as needed for heartburn or indigestion.    . fexofenadine (ALLEGRA ALLERGY) 180 MG tablet Take 1 tablet (180 mg total) by mouth daily. 90 tablet 3  . fluconazole (DIFLUCAN) 150 MG tablet Take 1 tablet (150 mg total) by mouth daily. 2 tablet 1  . Levothyroxine Sodium 50 MCG CAPS Take 1 capsule (50 mcg total) by mouth daily. 90 capsule 3  . Lifitegrast (XIIDRA) 5 % SOLN Apply 1 drop to eye 2 (two) times daily.    [name] losartan (COZAAR) 50 MG tablet Take 1 tablet (50 mg total) by mouth daily. 90 tablet 3  . meclizine (ANTIVERT) 25 MG tablet Take 1 tablet (25 mg total) by mouth 3 (three) times daily as needed. 270 tablet 1  . metoprolol succinate (TOPROL-XL) 25 MG 24 hr tablet Take 3 tablets (75 mg total) by mouth daily. 270 tablet 3  . montelukast (SINGULAIR) 10 MG tablet  Take 1 tablet (10 mg total) by mouth at bedtime. 90 tablet 3  . omega-3 acid ethyl esters (LOVAZA) 1 g capsule Take 1 capsule (1 g total) by mouth daily. 90 capsule 3  . promethazine (PHENERGAN) 25 MG tablet Take 1 tablet (25 mg total) by mouth every 6 (six) hours as needed for nausea or vomiting. 90 tablet 3  . Propylene Glycol (SYSTANE BALANCE) 0.6 % SOLN Apply 1 drop to eye as needed.    . psyllium (METAMUCIL) 58.6 % powder Take 1 packet by mouth as needed.    [name] Spacer/Aero-Holding Chambers (AEROCHAMBER MV) inhaler Use as instructed 1 each 0  . Syringe/Needle, Disp, (SYRINGE 3CC/20GX1") 20G X 1" 3 ML MISC 1 Syringe by Does not apply route every 30 (thirty) days. For use with b-12 injection monthly 50 each 0  . tretinoin (RETIN-A) 0.01 % gel Apply topically at  bedtime. 45 g 0  . urea (CARMOL) 10 % cream Apply topically as needed. 90 g 0   No facility-administered medications prior to visit.     Review of Systems;  Patient denies headache, fevers, malaise, unintentional weight loss, skin rash, eye pain, sinus congestion and sinus pain, sore throat, dysphagia,  hemoptysis , cough, dyspnea, wheezing, chest pain, palpitations, orthopnea, edema, abdominal pain, nausea, melena, diarrhea, constipation, flank pain, dysuria, hematuria, urinary  Frequency, nocturia, numbness, tingling, seizures,  Focal weakness, Loss of consciousness,  Tremor, insomnia, depression, anxiety, and suicidal ideation.      Objective:  BP 130/78 (BP Location: Left Arm, Patient Position: Sitting, Cuff Size: Normal)   Pulse 73   Temp 97.9 F (36.6 C) (Oral)   Resp 18   Wt 179 lb 6 oz (81.4 kg)   SpO2 95%   BMI 30.79 kg/m   BP Readings from Last 3 Encounters:  08/14/17 130/78  08/04/17 114/76  06/08/17 116/62    Wt Readings from Last 3 Encounters:  08/14/17 179 lb 6 oz (81.4 kg)  08/04/17 176 lb (79.8 kg)  06/08/17 175 lb 9.6 oz (79.7 kg)    General appearance: alert, cooperative and appears stated age Neck: no adenopathy, no carotid bruit, supple, symmetrical, trachea midline and thyroid not enlarged, symmetric, no tenderness/mass/nodules Back: symmetric, no curvature. ROM normal. No CVA tenderness. Lungs: clear to auscultation bilaterally Heart: regular rate and rhythm, S1, S2 normal, no murmur, click, rub or gallop Abdomen: soft, non-tender; bowel sounds normal; no masses,  no organomegaly Pulses: 2+ and symmetric Skin: Skin color, texture, turgor normal. No rashes or lesions Lymph nodes: Cervical, supraclavicular, and axillary nodes normal.   Assessment & Plan:   Problem List Items Addressed This Visit    Cystitis    Empiric treatment given weekend.  cipro rx .  culture growing E Coli        Other Visit Diagnoses    Dysuria    -  Primary   Relevant  Orders   POCT Urinalysis Dipstick (Completed)   Urine Culture (Completed)   Urine Microscopic Only (Completed)      I am having Ms. Schier start on ciprofloxacin and ondansetron. I am also having her maintain her aspirin, Propylene Glycol, psyllium, Docusate Calcium (STOOL SOFTENER PO), fexofenadine, tretinoin, urea, EPINEPHrine, cholecalciferol, famotidine, Vitamin-B Complex, diazepam, albuterol, atorvastatin, Azelastine-Fluticasone, conjugated estrogens, cyanocobalamin, cycloSPORINE, Levothyroxine Sodium, meclizine, montelukast, SYRINGE 3CC/20GX1", estradiol, AEROCHAMBER MV, beclomethasone, Lifitegrast, ALPRAZolam, fluconazole, omega-3 acid ethyl esters, losartan, promethazine, and metoprolol succinate.  Meds ordered this encounter  Medications  . ciprofloxacin (CIPRO) 250 MG tablet  Sig: Take 1 tablet (250 mg total) by mouth 2 (two) times daily.    Dispense:  10 tablet    Refill:  0  . ondansetron (ZOFRAN ODT) 4 MG disintegrating tablet    Sig: Take 1 tablet (4 mg total) by mouth every 8 (eight) hours as needed for nausea or vomiting.    Dispense:  20 tablet    Refill:  0    There are no discontinued medications.  Follow-up: No Follow-up on file.   Crecencio Mc, MD

## 2017-08-16 NOTE — Assessment & Plan Note (Signed)
Empiric treatment given weekend.  cipro rx .  culture growing E Coli

## 2017-08-17 ENCOUNTER — Encounter: Payer: Self-pay | Admitting: Internal Medicine

## 2017-08-17 LAB — URINE CULTURE
MICRO NUMBER:: 81171543
SPECIMEN QUALITY:: ADEQUATE

## 2017-08-21 ENCOUNTER — Other Ambulatory Visit (INDEPENDENT_AMBULATORY_CARE_PROVIDER_SITE_OTHER): Payer: Medicare Other

## 2017-08-21 ENCOUNTER — Encounter: Payer: Self-pay | Admitting: Internal Medicine

## 2017-08-21 ENCOUNTER — Telehealth: Payer: Self-pay | Admitting: Internal Medicine

## 2017-08-21 DIAGNOSIS — R300 Dysuria: Secondary | ICD-10-CM | POA: Diagnosis not present

## 2017-08-21 LAB — POCT URINALYSIS DIPSTICK
Bilirubin, UA: NEGATIVE
Glucose, UA: NEGATIVE
Ketones, UA: NEGATIVE
Leukocytes, UA: NEGATIVE
Nitrite, UA: NEGATIVE
Protein, UA: NEGATIVE
Spec Grav, UA: 1.01 (ref 1.010–1.025)
Urobilinogen, UA: 0.2 E.U./dL
pH, UA: 7.5 (ref 5.0–8.0)

## 2017-08-21 LAB — URINALYSIS, MICROSCOPIC ONLY
RBC / HPF: NONE SEEN (ref 0–?)
WBC, UA: NONE SEEN (ref 0–?)

## 2017-08-21 NOTE — Telephone Encounter (Signed)
Pt stated that she is still having UTI issues and has finished the medication wanted to know what the next step is

## 2017-08-21 NOTE — Telephone Encounter (Signed)
Patient finished her antibiotic for UTI on Wednesday. Patient says her symptoms have not gone away completely but she does not have any worsening symptoms.

## 2017-08-21 NOTE — Telephone Encounter (Signed)
Can she drop off a urine specimen for recheck?  I will order . My chart message sent

## 2017-08-22 LAB — URINE CULTURE
MICRO NUMBER:: 81202730
Result:: NO GROWTH
SPECIMEN QUALITY:: ADEQUATE

## 2017-08-26 ENCOUNTER — Ambulatory Visit (INDEPENDENT_AMBULATORY_CARE_PROVIDER_SITE_OTHER): Payer: Medicare Other | Admitting: Internal Medicine

## 2017-08-26 ENCOUNTER — Encounter: Payer: Self-pay | Admitting: Internal Medicine

## 2017-08-26 VITALS — BP 110/70 | HR 72 | Temp 97.9°F | Resp 15 | Ht 64.0 in | Wt 178.0 lb

## 2017-08-26 DIAGNOSIS — J3089 Other allergic rhinitis: Secondary | ICD-10-CM

## 2017-08-26 DIAGNOSIS — E034 Atrophy of thyroid (acquired): Secondary | ICD-10-CM

## 2017-08-26 DIAGNOSIS — L500 Allergic urticaria: Secondary | ICD-10-CM

## 2017-08-26 DIAGNOSIS — M19049 Primary osteoarthritis, unspecified hand: Secondary | ICD-10-CM | POA: Diagnosis not present

## 2017-08-26 DIAGNOSIS — I10 Essential (primary) hypertension: Secondary | ICD-10-CM | POA: Diagnosis not present

## 2017-08-26 DIAGNOSIS — N951 Menopausal and female climacteric states: Secondary | ICD-10-CM | POA: Diagnosis not present

## 2017-08-26 LAB — C-REACTIVE PROTEIN: CRP: 0.1 mg/dL — ABNORMAL LOW (ref 0.5–20.0)

## 2017-08-26 LAB — SEDIMENTATION RATE: Sed Rate: 23 mm/hr (ref 0–30)

## 2017-08-26 MED ORDER — UREA 10 % EX CREA: TOPICAL_CREAM | CUTANEOUS | 3 refills | Status: DC | PRN

## 2017-08-26 MED ORDER — TRETINOIN 0.01 % EX GEL: Freq: Every day | CUTANEOUS | 3 refills | Status: DC

## 2017-08-26 MED ORDER — AEROCHAMBER MV MISC: 0 refills | Status: DC

## 2017-08-26 MED ORDER — DICLOFENAC SODIUM 1 % TD GEL: 2.0000 g | Freq: Four times a day (QID) | TRANSDERMAL | 3 refills | Status: DC

## 2017-08-26 MED ORDER — OMEGA-3-ACID ETHYL ESTERS 1 G PO CAPS: 1.0000 g | ORAL_CAPSULE | Freq: Every day | ORAL | 3 refills | Status: DC

## 2017-08-26 MED ORDER — ALBUTEROL SULFATE HFA 108 (90 BASE) MCG/ACT IN AERS: 2.0000 | INHALATION_SPRAY | Freq: Four times a day (QID) | RESPIRATORY_TRACT | 3 refills | Status: DC | PRN

## 2017-08-26 MED ORDER — ESTRADIOL 0.1 MG/24HR TD PTTW: 1.0000 | MEDICATED_PATCH | TRANSDERMAL | 3 refills | Status: DC

## 2017-08-26 NOTE — Patient Instructions (Signed)
Referrals are in process to:  Dr Annie Main (hand surgeon) [facility]   Trial of diclofenac gel up to 4 times daily (rub on painful joints)  Patients tell me that clove and cinammon oil help as well  Resume the fish oil (lovoaza)  Take 75 mg metoprolol in the evening and 50 mg losartan in the morning

## 2017-08-26 NOTE — Progress Notes (Signed)
Subjective:  Patient ID: Angela Mendoza, female    DOB: Mar 11, 1951  Age: 67 y.o. MRN: [id number]  CC: The primary encounter diagnosis was Arthritis pain, hand. Diagnoses of Allergic rhinitis due to other allergic trigger, unspecified seasonality, Essential hypertension, benign, Allergic urticaria, Menopausal syndrome, and Hypothyroidism due to acquired atrophy of thyroid were also pertinent to this visit.  HPI Angela Mendoza presents for evaluation of worsening pain involving the  DIPS of most fingers.  She describes sharp pain that occurs if of her fingers brush up against a hard object.  She describes pain that interferes with bending , making a fist and grasping.  sHe has prominent Heberden's nodes on all fingers.  \Her pain is now interfering with activities of daily living, icluding dressing,  opening jars and food preparation.  2) requesting a referral to ALLERGIST.  She was last seen by 1 approximately 2 YEARS AGO by Dr. Annamaria Boots after an episode of angioedema of undetermined etiology.  The allergist had. RECOMMENDED ALLERGY desensitization which she never did, and antihistamine use.    She is now requesting referral to a local allergist.  3) Hypertension: patient checks blood pressure twice weekly at home.  Readings have been for the most part < 140/80 at rest . Patient is following a reduce salt diet most days and is taking medications as prescribed  Outpatient Medications Prior to Visit  Medication Sig Dispense Refill  . ALPRAZolam (XANAX) 0.25 MG tablet TAKE 1 TABLET (0.25 MG TOTAL) BY MOUTH ONCE DAILY AS NEEDED FOR SLEEP. 30 tablet 2  . aspirin 81 MG tablet Take 81 mg by mouth daily.      [name] atorvastatin (LIPITOR) 40 MG tablet Take 1 tablet (40 mg total) by mouth daily. 90 tablet 3  . Azelastine-Fluticasone (DYMISTA) 137-50 MCG/ACT SUSP Place 1 spray into the nose 2 (two) times daily. 69 g 3  . B Complex Vitamins (VITAMIN-B COMPLEX) TABS Take 1 tablet by mouth daily.    .  beclomethasone (QVAR REDIHALER) 80 MCG/ACT inhaler Inhale 2 puffs into the lungs 2 (two) times daily. 3 Inhaler 3  . cholecalciferol (VITAMIN D) 1000 units tablet Take 1,000 Units by mouth daily.     [name] conjugated estrogens (PREMARIN) vaginal cream Place 1 Applicatorful vaginally 2 (two) times a week. 90 day supply,  Refill for one year 90 g 3  . cyanocobalamin (,VITAMIN B-12,) 1000 MCG/ML injection Inject 1 mL (1,000 mcg total) into the muscle every 30 (thirty) days. 3 mL 3  . cycloSPORINE (RESTASIS) 0.05 % ophthalmic emulsion Place 1 drop into both eyes 2 (two) times daily. 3 each 3  . diazepam (VALIUM) 5 MG tablet TAKE 1 TABLET BY MOUTH EVERY 6 HOURS AS NEEDED FOR VERTIGO 30 tablet 1  . Docusate Calcium (STOOL SOFTENER PO) Take 1 tablet by mouth as needed.    [name] EPINEPHrine 0.3 mg/0.3 mL IJ SOAJ injection Inject 0.3 mLs (0.3 mg total) into the muscle once. 2 Device 0  . famotidine (PEPCID) 20 MG tablet Take 20 mg by mouth daily as needed for heartburn or indigestion.    . fexofenadine (ALLEGRA ALLERGY) 180 MG tablet Take 1 tablet (180 mg total) by mouth daily. 90 tablet 3  . fluconazole (DIFLUCAN) 150 MG tablet Take 1 tablet (150 mg total) by mouth daily. 2 tablet 1  . Levothyroxine Sodium 50 MCG CAPS Take 1 capsule (50 mcg total) by mouth daily. 90 capsule 3  . Lifitegrast (XIIDRA) 5 % SOLN Apply 1 drop to  eye 2 (two) times daily.    [name] losartan (COZAAR) 50 MG tablet Take 1 tablet (50 mg total) by mouth daily. 90 tablet 3  . meclizine (ANTIVERT) 25 MG tablet Take 1 tablet (25 mg total) by mouth 3 (three) times daily as needed. 270 tablet 1  . metoprolol succinate (TOPROL-XL) 25 MG 24 hr tablet Take 3 tablets (75 mg total) by mouth daily. 270 tablet 3  . montelukast (SINGULAIR) 10 MG tablet Take 1 tablet (10 mg total) by mouth at bedtime. 90 tablet 3  . ondansetron (ZOFRAN ODT) 4 MG disintegrating tablet Take 1 tablet (4 mg total) by mouth every 8 (eight) hours as needed for nausea or vomiting. 20  tablet 0  . promethazine (PHENERGAN) 25 MG tablet Take 1 tablet (25 mg total) by mouth every 6 (six) hours as needed for nausea or vomiting. 90 tablet 3  . Propylene Glycol (SYSTANE BALANCE) 0.6 % SOLN Apply 1 drop to eye as needed.    . psyllium (METAMUCIL) 58.6 % powder Take 1 packet by mouth as needed.    . Syringe/Needle, Disp, (SYRINGE 3CC/20GX1") 20G X 1" 3 ML MISC 1 Syringe by Does not apply route every 30 (thirty) days. For use with b-12 injection monthly 50 each 0  . albuterol (PROVENTIL HFA) 108 (90 Base) MCG/ACT inhaler Inhale 2 puffs into the lungs every 6 (six) hours as needed. 3 Inhaler 3  . estradiol (VIVELLE-DOT) 0.1 MG/24HR patch Place 1 patch onto the skin 2 (two) times a week.    . omega-3 acid ethyl esters (LOVAZA) 1 g capsule Take 1 capsule (1 g total) by mouth daily. 90 capsule 3  . Spacer/Aero-Holding Chambers (AEROCHAMBER MV) inhaler Use as instructed 1 each 0  . tretinoin (RETIN-A) 0.01 % gel Apply topically at bedtime. 45 g 0  . urea (CARMOL) 10 % cream Apply topically as needed. 90 g 0  . ciprofloxacin (CIPRO) 250 MG tablet Take 1 tablet (250 mg total) by mouth 2 (two) times daily. (Patient not taking: Reported on 08/26/2017) 10 tablet 0   No facility-administered medications prior to visit.     Review of Systems;  Patient denies headache, fevers, malaise, unintentional weight loss, skin rash, eye pain, sinus congestion and sinus pain, sore throat, dysphagia,  hemoptysis , cough, dyspnea, wheezing, chest pain, palpitations, orthopnea, edema, abdominal pain, nausea, melena, diarrhea, constipation, flank pain, dysuria, hematuria, urinary  Frequency, nocturia, numbness, tingling, seizures,  Focal weakness, Loss of consciousness,  Tremor, insomnia, depression, anxiety, and suicidal ideation.      Objective:  BP 110/70 (BP Location: Left Arm, Patient Position: Sitting, Cuff Size: Normal)   Pulse 72   Temp 97.9 F (36.6 C) (Oral)   Resp 15   Ht 5\' 4"  (1.626 m)   Wt  178 lb (80.7 kg)   SpO2 98%   BMI 30.55 kg/m   BP Readings from Last 3 Encounters:  08/26/17 110/70  08/14/17 130/78  08/04/17 114/76    Wt Readings from Last 3 Encounters:  08/26/17 178 lb (80.7 kg)  08/14/17 179 lb 6 oz (81.4 kg)  08/04/17 176 lb (79.8 kg)    General appearance: alert, cooperative and appears stated age Neck: no adenopathy, no carotid bruit, supple, symmetrical, trachea midline and thyroid not enlarged, symmetric, no tenderness/mass/nodules Back: symmetric, no curvature. ROM normal. No CVA tenderness. Lungs: clear to auscultation bilaterally Heart: regular rate and rhythm, S1, S2 normal, no murmur, click, rub or gallop Abdomen: soft, non-tender; bowel sounds normal; no masses,  no organomegaly Pulses: 2+ and symmetric Skin: Skin color, texture, turgor normal. No rashes or lesions Lymph nodes: Cervical, supraclavicular, and axillary nodes normal. MSK : Bilateral Heberden's nodes affecting  fingers 2 through 5 .  No synovitis or joint redness.  Lab Results  Component Value Date   HGBA1C 5.9 10/12/2015    Lab Results  Component Value Date   CREATININE 0.8 09/26/2016   CREATININE 0.86 04/05/2015   CREATININE 0.9 08/17/2014    Lab Results  Component Value Date   WBC 10.2 05/26/2017   HGB 13.2 05/26/2017   HCT 40.0 05/26/2017   PLT 376.0 05/26/2017   GLUCOSE 94 04/05/2015   CHOL 150 02/24/2017   TRIG 117 02/24/2017   HDL 56 02/24/2017   LDLDIRECT 84.0 04/05/2015   LDLCALC 71 02/24/2017   ALT 23 09/26/2016   AST 24 09/26/2016   NA 142 09/26/2016   K 4.3 09/26/2016   CL 103 04/05/2015   CREATININE 0.8 09/26/2016   BUN 12 09/26/2016   CO2 28 04/05/2015   TSH 1.80 02/24/2017   HGBA1C 5.9 10/12/2015    Dg Tibia/fibula Right  Result Date: 05/14/2017 CLINICAL DATA:  Recent fall.  Leg hematoma and pain EXAM: RIGHT TIBIA AND FIBULA - 2 VIEW COMPARISON:  None. FINDINGS: There is no evidence of fracture or other focal bone lesions. Soft tissues are  unremarkable. IMPRESSION: Negative. Electronically Signed   By: Franchot Gallo M.D.   On: 05/14/2017 15:15   Dg Ankle Complete Right  Result Date: 05/14/2017 CLINICAL DATA:  Fall.  Leg hematoma and pain EXAM: RIGHT ANKLE - COMPLETE 3+ VIEW COMPARISON:  None. FINDINGS: There is no evidence of fracture, dislocation, or joint effusion. There is no evidence of arthropathy or other focal bone abnormality. Soft tissues are unremarkable. IMPRESSION: Negative. Electronically Signed   By: Franchot Gallo M.D.   On: 05/14/2017 15:16   Ct Head Wo Contrast  Result Date: 05/14/2017 CLINICAL DATA:  Head injury initial encounter. Fell down stairs 1 week ago EXAM: CT HEAD WITHOUT CONTRAST TECHNIQUE: Contiguous axial images were obtained from the base of the skull through the vertex without intravenous contrast. COMPARISON:  None. FINDINGS: Brain: No evidence of acute infarction, hemorrhage, hydrocephalus, extra-axial collection or mass lesion/mass effect. Vascular: No hyperdense vessel or unexpected calcification. Skull: Negative Sinuses/Orbits: Negative Other: None IMPRESSION: Negative CT head These results will be called to the ordering clinician or representative by the Radiologist Assistant, and communication documented in the PACS or zVision Dashboard. Electronically Signed   By: Franchot Gallo M.D.   On: 05/14/2017 13:35   US Venous Img Lower Unilateral Right  Result Date: 05/14/2017 CLINICAL DATA:  Right lower extremity swelling. EXAM: RIGHT LOWER EXTREMITY VENOUS DOPPLER ULTRASOUND TECHNIQUE: Gray-scale sonography with graded compression, as well as color Doppler and duplex ultrasound were performed to evaluate the lower extremity deep venous systems from the level of the common femoral vein and including the common femoral, femoral, profunda femoral, popliteal and calf veins including the posterior tibial, peroneal and gastrocnemius veins when visible. The superficial great saphenous vein was also interrogated.  Spectral Doppler was utilized to evaluate flow at rest and with distal augmentation maneuvers in the common femoral, femoral and popliteal veins. COMPARISON:  None. FINDINGS: Contralateral Common Femoral Vein: Respiratory phasicity is normal and symmetric with the symptomatic side. No evidence of thrombus. Normal compressibility. Common Femoral Vein: No evidence of thrombus. Normal compressibility, respiratory phasicity and response to augmentation. Saphenofemoral Junction: No evidence of thrombus. Normal compressibility and  flow on color Doppler imaging. Profunda Femoral Vein: No evidence of thrombus. Normal compressibility and flow on color Doppler imaging. Femoral Vein: No evidence of thrombus. Normal compressibility, respiratory phasicity and response to augmentation. Popliteal Vein: No evidence of thrombus. Normal compressibility, respiratory phasicity and response to augmentation. Calf Veins: No evidence of thrombus. Normal compressibility and flow on color Doppler imaging. Superficial Great Saphenous Vein: No evidence of thrombus. Normal compressibility and flow on color Doppler imaging. Other Findings:  None. IMPRESSION: No evidence of DVT within the right lower extremity. Electronically Signed   By: Marcello Moores  Register   On: 05/14/2017 14:53    Assessment & Plan:   Problem List Items Addressed This Visit    Allergic urticaria    With isolated episode of probable angioedema in 2016.  Symptoms are managed with daily Allegra and Singulair.  Referral to local allergist per patient request.      Arthritis pain, hand - Primary    Exam is classic for degenerative arthritis.  However I  will check sed rate CRP and rheumatoid factor, which are all normal.  Referral to hand surgeon for definitive therapy as her current symptoms are negatively impacting her ability to perform functions of daily living.      Relevant Orders   Sedimentation rate (Completed)   Rheumatoid Factor (Completed)   C-reactive  protein (Completed)   Ambulatory referral to Orthopedic Surgery   Essential hypertension, benign    Continue metoprolol 75 mg in the evening and losartan 50 mg in the morning.  Renal function was normal in November at [facility].  Results have been abstracted.  She is overdue for assessment of renal function \\ Lab Results  Component Value Date   CREATININE 0.8 09/26/2016   Lab Results  Component Value Date   NA 142 09/26/2016   K 4.3 09/26/2016   CL 103 04/05/2015   CO2 28 04/05/2015         Relevant Medications   omega-3 acid ethyl esters (LOVAZA) 1 g capsule   Other Relevant Orders   Comprehensive metabolic panel   Hypothyroidism    Thyroid function is WNL on current dose.  No current changes needed.   Lab Results  Component Value Date   TSH 1.80 02/24/2017         Menopausal syndrome    Managed with       Other Visit Diagnoses    Allergic rhinitis due to other allergic trigger, unspecified seasonality       Relevant Orders   Ambulatory referral to Allergy      I have discontinued Ms. Reaver's ciprofloxacin. I have also changed her estradiol. Additionally, I am having her maintain her aspirin, Propylene Glycol, psyllium, Docusate Calcium (STOOL SOFTENER PO), fexofenadine, EPINEPHrine, cholecalciferol, famotidine, Vitamin-B Complex, diazepam, atorvastatin, Azelastine-Fluticasone, conjugated estrogens, cyanocobalamin, cycloSPORINE, Levothyroxine Sodium, meclizine, montelukast, SYRINGE 3CC/20GX1", beclomethasone, Lifitegrast, ALPRAZolam, fluconazole, losartan, promethazine, metoprolol succinate, ondansetron, AEROCHAMBER MV, albuterol, tretinoin, urea, diclofenac sodium, and omega-3 acid ethyl esters.  Meds ordered this encounter  Medications  . Spacer/Aero-Holding Chambers (AEROCHAMBER MV) inhaler    Sig: Use as instructed    Dispense:  1 each    Refill:  0  . albuterol (PROVENTIL HFA) 108 (90 Base) MCG/ACT inhaler    Sig: Inhale 2 puffs into the lungs every 6  (six) hours as needed.    Dispense:  3 Inhaler    Refill:  3  . estradiol (VIVELLE-DOT) 0.1 MG/24HR patch    Sig: Place 1 patch (0.1 mg  total) onto the skin 2 (two) times a week.    Dispense:  24 patch    Refill:  3    NAME BRAND ONLY ,  . tretinoin (RETIN-A) 0.01 % gel    Sig: Apply topically at bedtime.    Dispense:  45 g    Refill:  3  . urea (CARMOL) 10 % cream    Sig: Apply topically as needed.    Dispense:  90 g    Refill:  3  . DISCONTD: diclofenac sodium (VOLTAREN) 1 % GEL    Sig: Apply 2 g topically 4 (four) times daily.    Dispense:  100 g    Refill:  3  . diclofenac sodium (VOLTAREN) 1 % GEL    Sig: Apply 2 g topically 4 (four) times daily.    Dispense:  100 g    Refill:  3  . omega-3 acid ethyl esters (LOVAZA) 1 g capsule    Sig: Take 1 capsule (1 g total) by mouth daily.    Dispense:  90 capsule    Refill:  3    Medications Discontinued During This Encounter  Medication Reason  . ciprofloxacin (CIPRO) 250 MG tablet Patient has not taken in last 30 days  . Spacer/Aero-Holding Chambers (AEROCHAMBER MV) inhaler Reorder  . albuterol (PROVENTIL HFA) 108 (90 Base) MCG/ACT inhaler Reorder  . estradiol (VIVELLE-DOT) 0.1 MG/24HR patch Reorder  . tretinoin (RETIN-A) 0.01 % gel Reorder  . urea (CARMOL) 10 % cream Reorder  . diclofenac sodium (VOLTAREN) 1 % GEL Reorder  . omega-3 acid ethyl esters (LOVAZA) 1 g capsule Reorder    Follow-up: No Follow-up on file.   Crecencio Mc, MD

## 2017-08-27 ENCOUNTER — Encounter: Payer: Self-pay | Admitting: Internal Medicine

## 2017-08-27 DIAGNOSIS — M19049 Primary osteoarthritis, unspecified hand: Secondary | ICD-10-CM | POA: Insufficient documentation

## 2017-08-27 LAB — RHEUMATOID FACTOR: Rhuematoid fact SerPl-aCnc: <14 IU/mL (ref <14)

## 2017-08-27 NOTE — Assessment & Plan Note (Signed)
Thyroid function is WNL on current dose.  No current changes needed.   Lab Results  Component Value Date   TSH 1.80 02/24/2017

## 2017-08-27 NOTE — Assessment & Plan Note (Addendum)
Exam is classic for degenerative arthritis.  However I  will check sed rate CRP and rheumatoid factor, which are all normal.  Referral to hand surgeon for definitive therapy as her current symptoms are negatively impacting her ability to perform functions of daily living.

## 2017-08-27 NOTE — Assessment & Plan Note (Addendum)
Continue metoprolol 75 mg in the evening and losartan 50 mg in the morning.  Renal function was normal in November at [facility].  Results have been abstracted.  She is overdue for assessment of renal function \\ Lab Results  Component Value Date   CREATININE 0.8 09/26/2016   Lab Results  Component Value Date   NA 142 09/26/2016   K 4.3 09/26/2016   CL 103 04/05/2015   CO2 28 04/05/2015

## 2017-08-27 NOTE — Assessment & Plan Note (Signed)
Managed with  

## 2017-08-27 NOTE — Assessment & Plan Note (Signed)
With isolated episode of probable angioedema in 2016.  Symptoms are managed with daily Allegra and Singulair.  Referral to local allergist per patient request.

## 2017-08-28 ENCOUNTER — Other Ambulatory Visit (INDEPENDENT_AMBULATORY_CARE_PROVIDER_SITE_OTHER): Payer: Medicare Other

## 2017-08-28 DIAGNOSIS — I10 Essential (primary) hypertension: Secondary | ICD-10-CM

## 2017-08-28 LAB — COMPREHENSIVE METABOLIC PANEL
ALT: 33 U/L (ref 0–35)
AST: 22 U/L (ref 0–37)
Albumin: 4.2 g/dL (ref 3.5–5.2)
Alkaline Phosphatase: 61 U/L (ref 39–117)
BUN: 9 mg/dL (ref 6–23)
CO2: 27 mEq/L (ref 19–32)
Calcium: 9.5 mg/dL (ref 8.4–10.5)
Chloride: 102 mEq/L (ref 96–112)
Creatinine, Ser: 0.77 mg/dL (ref 0.40–1.20)
GFR: 79.52 mL/min (ref >60.00)
Glucose, Bld: 77 mg/dL (ref 70–99)
Potassium: 4.2 mEq/L (ref 3.5–5.1)
Sodium: 138 mEq/L (ref 135–145)
Total Bilirubin: 0.9 mg/dL (ref 0.2–1.2)
Total Protein: 7.1 g/dL (ref 6.0–8.3)

## 2017-08-30 ENCOUNTER — Encounter: Payer: Self-pay | Admitting: Internal Medicine

## 2017-09-23 DIAGNOSIS — H04123 Dry eye syndrome of bilateral lacrimal glands: Secondary | ICD-10-CM | POA: Diagnosis not present

## 2017-10-30 ENCOUNTER — Telehealth: Payer: Self-pay | Admitting: Pulmonary Disease

## 2017-10-30 ENCOUNTER — Ambulatory Visit: Payer: Medicare Other | Admitting: Family Medicine

## 2017-10-30 ENCOUNTER — Ambulatory Visit: Payer: Self-pay

## 2017-10-30 MED ORDER — AZITHROMYCIN 250 MG PO TABS: ORAL_TABLET | ORAL | 0 refills | Status: DC

## 2017-10-30 NOTE — Telephone Encounter (Signed)
Spoke with pt. States that she thinks she has bronchitis. Reports cough, chest congestion, chest tightness, DOE. Cough is non productive. Denies wheezing, fever or body aches. Symptoms started 3-4 days ago. We do not have any available appointments today for pt to come in.  MW - please advise as VS is not available. Thanks.

## 2017-10-30 NOTE — Telephone Encounter (Signed)
FYI- Patient has appointment at [facility] office today

## 2017-10-30 NOTE — Telephone Encounter (Signed)
Pt called to report she has been having runny nose, cough, SOB with exertion, sinus congestion, sore throat, red eyes,ears muffled. HR 70 and O2 sat. 98%. Pt has h/o asthma. Pt states she thinks its broncitis and her breathing feels tight. Pt also sees a pulmonologist. Care advice given for UCC. Pt refusing to go to [facility]. Called Larena Glassman at [facility] and has no openings. [place] and has no openings. Checked [place], Journalist, newspaper then [employer]. Appt made with Dr Clearance Coots at 2 pm today. Reason for Disposition . [1] MILD difficulty breathing (e.g., minimal/no SOB at rest, SOB with walking, pulse <100) AND [2] NEW-onset or WORSE than normal  Answer Assessment - Initial Assessment Questions 1. RESPIRATORY STATUS: "Describe your breathing?" (e.g., wheezing, shortness of breath, unable to speak, severe coughing)      Shortness of breath and labored with exertion, feels tight 2. ONSET: "When did this breathing problem begin?"      2 days 3. PATTERN "Does the difficult breathing come and go, or has it been constant since it started?"      constant 4. SEVERITY: "How bad is your breathing?" (e.g., mild, moderate, severe)    - MILD: No SOB at rest, mild SOB with walking, speaks normally in sentences, can lay down, no retractions, pulse < 100.    - MODERATE: SOB at rest, SOB with minimal exertion and prefers to sit, cannot lie down flat, speaks in phrases, mild retractions, audible wheezing, pulse 100-120.    - SEVERE: Very SOB at rest, speaks in single words, struggling to breathe, sitting hunched forward, retractions, pulse > 120      mild 5. RECURRENT SYMPTOM: "Have you had difficulty breathing before?" If so, ask: "When was the last time?" and "What happened that time?"      Yes- the last time 2 years ago with bronchitis or pneumonia- abx, inhaler, breathing tx,  6. CARDIAC HISTORY: "Do you have any history of heart disease?" (e.g., heart attack, angina, bypass surgery, angioplasty)   HTN 7. LUNG HISTORY: "Do you have any history of lung disease?"  (e.g., pulmonary embolus, asthma, emphysema)     asthma 8. CAUSE: "What do you think is causing the breathing problem?"      bronchitis 9. OTHER SYMPTOMS: "Do you have any other symptoms? (e.g., dizziness, runny nose, cough, chest pain, fever)     Runny nose, redness back throat, cough, felt feverish on and off (pt states she has hot flashes), eyes red, ears are muffled, sinus pressure, post nasal drip 10. PREGNANCY: "Is there any chance you are pregnant?" "When was your last menstrual period?"       n/ano 11. TRAVEL: "Have you traveled out of the country in the last month?" (e.g., travel history, exposures)       no  Protocols used: BREATHING DIFFICULTY-A-AH

## 2017-10-30 NOTE — Telephone Encounter (Signed)
Zpak, f/u next week if not improved

## 2017-10-30 NOTE — Telephone Encounter (Signed)
Spoke with patient regarding MW recommendations Placed RX for [facility] today to [facility] [place]  Scheduled follow up appt with VS for 11/03/2017 Pt verbalized understanding, no further concerns or questions Nothing further needed

## 2017-11-03 ENCOUNTER — Ambulatory Visit: Payer: Medicare Other | Admitting: Pulmonary Disease

## 2017-12-01 DIAGNOSIS — D485 Neoplasm of uncertain behavior of skin: Secondary | ICD-10-CM | POA: Diagnosis not present

## 2017-12-01 DIAGNOSIS — L820 Inflamed seborrheic keratosis: Secondary | ICD-10-CM | POA: Diagnosis not present

## 2017-12-28 ENCOUNTER — Telehealth: Payer: Self-pay | Admitting: Pulmonary Disease

## 2017-12-28 ENCOUNTER — Ambulatory Visit (INDEPENDENT_AMBULATORY_CARE_PROVIDER_SITE_OTHER): Payer: Medicare Other | Admitting: Internal Medicine

## 2017-12-28 ENCOUNTER — Encounter: Payer: Self-pay | Admitting: Internal Medicine

## 2017-12-28 ENCOUNTER — Telehealth: Payer: Self-pay | Admitting: Internal Medicine

## 2017-12-28 VITALS — BP 140/82 | HR 68 | Temp 98.4°F | Ht 65.0 in | Wt 169.2 lb

## 2017-12-28 DIAGNOSIS — B370 Candidal stomatitis: Secondary | ICD-10-CM | POA: Diagnosis not present

## 2017-12-28 DIAGNOSIS — R058 Other specified cough: Secondary | ICD-10-CM

## 2017-12-28 DIAGNOSIS — R05 Cough: Secondary | ICD-10-CM | POA: Diagnosis not present

## 2017-12-28 MED ORDER — CLOTRIMAZOLE 10 MG MT TROC: 10.0000 mg | Freq: Every day | OROMUCOSAL | 1 refills | Status: DC

## 2017-12-28 MED ORDER — MONTELUKAST SODIUM 10 MG PO TABS: 10.0000 mg | ORAL_TABLET | Freq: Every day | ORAL | 3 refills | Status: DC

## 2017-12-28 MED ORDER — BECLOMETHASONE DIPROP HFA 80 MCG/ACT IN AERB: 2.0000 | INHALATION_SPRAY | Freq: Two times a day (BID) | RESPIRATORY_TRACT | 3 refills | Status: DC

## 2017-12-28 MED ORDER — AZITHROMYCIN 250 MG PO TABS: ORAL_TABLET | ORAL | 0 refills | Status: DC

## 2017-12-28 MED ORDER — BENZONATATE 200 MG PO CAPS: 200.0000 mg | ORAL_CAPSULE | Freq: Three times a day (TID) | ORAL | 1 refills | Status: DC | PRN

## 2017-12-28 MED ORDER — PREDNISONE 10 MG PO TABS: ORAL_TABLET | ORAL | 0 refills | Status: DC

## 2017-12-28 NOTE — Patient Instructions (Addendum)
zpak  Prednisone 10 mg take  4 each am x 2 days,   2 each am x 2 days,  1 each am x 2 days and stop   For cough > tessalon 200 mg every 8 hours   For drainage / throat tickle try take CHLORPHENIRAMINE  4 mg - take one every 4 hours as needed - available over the counter- may cause drowsiness so start with just a bedtime dose or two and see how you tolerate it before trying in daytime     If not improving add pepcid 20 mg OTC  after bfast and after supper and call in a week if not definitely turning the corner   GERD (REFLUX)  is an extremely common cause of respiratory symptoms just like yours , many times with no obvious heartburn at all.    It can be treated with medication, but also with lifestyle changes including elevation of the head of your bed (ideally with 6 inch  bed blocks),  Smoking cessation, avoidance of late meals, excessive alcohol, and avoid fatty foods, chocolate, peppermint, colas, red wine, and acidic juices such as orange juice.  NO MINT OR MENTHOL PRODUCTS SO NO COUGH DROPS   USE SUGARLESS CANDY INSTEAD (Jolley ranchers or Stover's or Life Savers) or even ice chips will also do - the key is to swallow to prevent all throat clearing. NO OIL BASED VITAMINS - use powdered substitutes.   Call if not better by end of week

## 2017-12-28 NOTE — Telephone Encounter (Signed)
Pt c/o sore throat, stuffy nose, PND, chills, nonprod cough, upset stomach.  S/s present Xapprox 1 week but became remarkably worse X1 day.   Denies fever, chest pain, productive cough.  Pt has been taking maintenance allergy meds and sinus rinse, requesting additional recs.  [facility] [place].   VS please advise on additional recs.  Thanks!

## 2017-12-28 NOTE — Telephone Encounter (Signed)
Pt has been scheduled for acute visit with MW today at 2:30. Pt is aware and voiced her understanding. Nothing further is needed.

## 2017-12-28 NOTE — Telephone Encounter (Signed)
She needs to come in for appointment to get flu and strep throat swabs.

## 2017-12-28 NOTE — Telephone Encounter (Signed)
Called and spoke with pt who stated she was in for an [place] today 12/28/17 with MW and had Rx sent to her mail pharmacy instead of her pharmacy she gets immediate Rx from.  I verified pt's pharmacy with her and she stated every med that was prescribed by MW other than the singulair and qvar today [facility] [place].  I reordered the scripts for pt and sent to the correct pharmacy.  Nothing further needed at this current time.

## 2017-12-28 NOTE — Progress Notes (Signed)
Subjective:     Patient ID: Angela Mendoza, female   DOB: [birth year], 67 y.o.   MRN: [id number]  HPI  49 yowf quit mosking 1975 prev followed by [name] then [facility] for asthma     12/28/2017 acute extended ov/Chad [place] re:  Cough variant asthma vs uacs flare p viral uri  Chief Complaint  Patient presents with  . Acute Visit    Pt states had loss of appetite for the past wk, and then started coughing 2 days ago. Cough is non prod. She also c/o nasal congestion and HA.   was doing fine on qvar / rarely needed saba then one week prior to OV   lost appetite/ nausea  Cough x sev days/  Some throat/ tickel at hs keeps her up as does nasal congestion but no sob  Not needing albuterol at all    No obvious day to day or daytime variability or assoc excess/ purulent sputum or mucus plugs or hemoptysis or cp or chest tightness, subjective wheeze or overt  symptoms. No unusual exposure hx or h/o childhood pna/ asthma or knowledge of premature birth.   . Also denies any obvious fluctuation of symptoms with weather or environmental changes or other aggravating or alleviating factors except as outlined above   Current Allergies, Complete Past Medical History, Past Surgical History, Family History, and Social History were reviewed in [employer] record.  ROS  The following are not active complaints unless bolded Hoarseness, sore throat, dysphagia, dental problems, itching, sneezing,  nasal congestion or discharge of excess mucus or purulent secretions, ear ache,   fever, chills, sweats, unintended wt loss or wt gain, classically pleuritic or exertional cp,  orthopnea pnd or leg swelling, presyncope, palpitations, abdominal pain, anorexia, nausea, vomiting, diarrhea  or change in bowel habits or change in bladder habits, change in stools or change in urine, dysuria, hematuria,  rash, arthralgias, visual complaints, headache, numbness, weakness or ataxia or problems with walking or  coordination,  change in mood/affect or memory.        Current Meds  Medication Sig  . albuterol (PROVENTIL HFA) 108 (90 Base) MCG/ACT inhaler Inhale 2 puffs into the lungs every 6 (six) hours as needed.  . ALPRAZolam (XANAX) 0.25 MG tablet TAKE 1 TABLET (0.25 MG TOTAL) BY MOUTH ONCE DAILY AS NEEDED FOR SLEEP.  [name] aspirin 81 MG tablet Take 81 mg by mouth daily.    [name] atorvastatin (LIPITOR) 40 MG tablet Take 1 tablet (40 mg total) by mouth daily.  . Azelastine-Fluticasone (DYMISTA) 137-50 MCG/ACT SUSP Place 1 spray into the nose 2 (two) times daily.  . B Complex Vitamins (VITAMIN-B COMPLEX) TABS Take 1 tablet by mouth daily.  . beclomethasone (QVAR REDIHALER) 80 MCG/ACT inhaler Inhale 2 puffs into the lungs 2 (two) times daily.  . cholecalciferol (VITAMIN D) 1000 units tablet Take 1,000 Units by mouth daily.   [name] conjugated estrogens (PREMARIN) vaginal cream Place 1 Applicatorful vaginally 2 (two) times a week. 90 day supply,  Refill for one year  . cyanocobalamin (,VITAMIN B-12,) 1000 MCG/ML injection Inject 1 mL (1,000 mcg total) into the muscle every 30 (thirty) days.  . cycloSPORINE (RESTASIS) 0.05 % ophthalmic emulsion Place 1 drop into both eyes 2 (two) times daily.  . diazepam (VALIUM) 5 MG tablet TAKE 1 TABLET BY MOUTH EVERY 6 HOURS AS NEEDED FOR VERTIGO  . diclofenac sodium (VOLTAREN) 1 % GEL Apply 2 g topically 4 (four) times daily.  . Docusate Calcium (STOOL SOFTENER  PO) Take 1 tablet by mouth as needed.  [name] EPINEPHrine 0.3 mg/0.3 mL IJ SOAJ injection Inject 0.3 mLs (0.3 mg total) into the muscle once.  [name] estradiol (VIVELLE-DOT) 0.1 MG/24HR patch Place 1 patch (0.1 mg total) onto the skin 2 (two) times a week.  . famotidine (PEPCID) 20 MG tablet Take 20 mg by mouth daily as needed for heartburn or indigestion.  . fexofenadine (ALLEGRA ALLERGY) 180 MG tablet Take 1 tablet (180 mg total) by mouth daily.  . Levothyroxine Sodium 50 MCG CAPS Take 1 capsule (50 mcg total) by mouth daily.  [name]  Lifitegrast (XIIDRA) 5 % SOLN Apply 1 drop to eye 2 (two) times daily.  [name] losartan (COZAAR) 50 MG tablet Take 1 tablet (50 mg total) by mouth daily.  . meclizine (ANTIVERT) 25 MG tablet Take 1 tablet (25 mg total) by mouth 3 (three) times daily as needed.  . metoprolol succinate (TOPROL-XL) 25 MG 24 hr tablet Take 3 tablets (75 mg total) by mouth daily.  . montelukast (SINGULAIR) 10 MG tablet Take 1 tablet (10 mg total) by mouth at bedtime.  [name] omega-3 acid ethyl esters (LOVAZA) 1 g capsule Take 1 capsule (1 g total) by mouth daily.  . ondansetron (ZOFRAN ODT) 4 MG disintegrating tablet Take 1 tablet (4 mg total) by mouth every 8 (eight) hours as needed for nausea or vomiting.  . promethazine (PHENERGAN) 25 MG tablet Take 1 tablet (25 mg total) by mouth every 6 (six) hours as needed for nausea or vomiting.  [name] Propylene Glycol (SYSTANE BALANCE) 0.6 % SOLN Apply 1 drop to eye as needed.  . psyllium (METAMUCIL) 58.6 % powder Take 1 packet by mouth as needed.  [name] Spacer/Aero-Holding Chambers (AEROCHAMBER MV) inhaler Use as instructed  . Syringe/Needle, Disp, (SYRINGE 3CC/20GX1") 20G X 1" 3 ML MISC 1 Syringe by Does not apply route every 30 (thirty) days. For use with b-12 injection monthly  . tretinoin (RETIN-A) 0.01 % gel Apply topically at bedtime.  . urea (CARMOL) 10 % cream Apply topically as needed.  . [DISCONTINUED] beclomethasone (QVAR REDIHALER) 80 MCG/ACT inhaler Inhale 2 puffs into the lungs 2 (two) times daily.  .   montelukast (SINGULAIR) 10 MG tablet Take 1 tablet (10 mg total) by mouth at bedtime.                 Review of Systems     Objective:   Physical Exam   amb wf wearing mask/ upper airway quality shrill sounding/ pseudowheeze only   Wt Readings from Last 3 Encounters:  12/28/17 169 lb 3.2 oz (76.7 kg)  08/26/17 178 lb (80.7 kg)  08/14/17 179 lb 6 oz (81.4 kg)     Vital signs reviewed - Note on arrival 02 sats  99% on RA      HEENT: nl dentition, turbinates  bilaterally, and oropharynx with minimal oral thrush. Nl external ear canals without cough reflex   NECK :  without JVD/Nodes/TM/ nl carotid upstrokes bilaterally   LUNGS: no acc muscle use,  Nl contour chest which is clear to A and P bilaterally without cough on insp or exp maneuvers - pseudowheezing only    CV:  RRR  no s3 or murmur or increase in P2, and no edema   ABD:  soft and nontender with nl inspiratory excursion in the supine position. No bruits or organomegaly appreciated, bowel sounds nl  MS:  Nl gait/ ext warm without deformities, calf tenderness, cyanosis or clubbing No obvious joint  restrictions   SKIN: warm and dry without lesions    NEURO:  alert, approp, nl sensorium with  no motor or cerebellar deficits apparent.        Assessment:

## 2017-12-29 ENCOUNTER — Encounter: Payer: Self-pay | Admitting: Internal Medicine

## 2017-12-29 NOTE — Assessment & Plan Note (Addendum)
Acute flare in setting of uri likely viral with little to support asthma exac here  Upper airway cough syndrome (previously labeled PNDS),  is so named because it's frequently impossible to sort out how much is  CR/sinusitis with freq throat clearing (which can be related to primary GERD)   vs  causing  secondary (" extra esophageal")  GERD from wide swings in gastric pressure that occur with throat clearing, often  promoting self use of mint and menthol lozenges that reduce the lower esophageal sphincter tone and exacerbate the problem further in a cyclical fashion.   These are the same pts (now being labeled as having "irritable larynx syndrome" by some cough centers) who not infrequently have a history of having failed to tolerate ace inhibitors,  dry powder inhalers or biphosphonates or report having atypical/extraesophageal reflux symptoms that don't respond to standard doses of PPI  and are easily confused as having aecopd or asthma flares by even experienced allergists/ pulmonologists (myself included).    rec max rx for gerd/ 1st gen H1 blockers per guidelines/ suppress cough with tessalon and added    zpak/pred to cover uri/asthma    I had an extended discussion with the patient reviewing all relevant studies completed to date and  lasting 15 to 20 minutes of a 25 minute acute office  visit  With pt not familiar to me.  Each maintenance medication was reviewed in detail including most importantly the difference between maintenance and prns and under what circumstances the prns are to be triggered using an action plan format that is not reflected in the computer generated alphabetically organized AVS.    Please see AVS for specific instructions unique to this visit that I personally wrote and verbalized to the the pt in detail and then reviewed with pt  by my nurse highlighting any  changes in therapy recommended at today's visit to their plan of care.

## 2017-12-29 NOTE — Assessment & Plan Note (Signed)
Very mild/ rx with clotrimazole 10 mg prn

## 2018-01-07 ENCOUNTER — Telehealth: Payer: Self-pay | Admitting: Internal Medicine

## 2018-01-07 MED ORDER — DOXYCYCLINE HYCLATE 100 MG PO TABS: 100.0000 mg | ORAL_TABLET | Freq: Two times a day (BID) | ORAL | 0 refills | Status: DC

## 2018-01-07 NOTE — Telephone Encounter (Signed)
Offer doxycycline 100 mg, # 14, 1 twice daily.  Ok to use OTC Mucinex-DM  Or Delsym cough syrup. Keep appt w TP tomorrow

## 2018-01-07 NOTE — Telephone Encounter (Signed)
Spoke with pt. She is aware of Dr. Janee Morn recommendations. Rx has been sent in. Nothing further was needed.

## 2018-01-07 NOTE — Telephone Encounter (Signed)
Spoke with pt. She states that she is not feeling well. Reports cough, chest tightness and severe sore throat. Cough is non productive. Denies wheezing, SOB or fever. Symptoms started yesterday. Pt is scheduled to see TP tomorrow but wants Dr. Juanetta Gosling recommendations now.  Dr. Annamaria Boots - please advise as Dr. Halford Chessman is not available. Thanks.

## 2018-01-08 ENCOUNTER — Other Ambulatory Visit (INDEPENDENT_AMBULATORY_CARE_PROVIDER_SITE_OTHER): Payer: Medicare Other

## 2018-01-08 ENCOUNTER — Encounter: Payer: Self-pay | Admitting: Adult Health

## 2018-01-08 ENCOUNTER — Ambulatory Visit (INDEPENDENT_AMBULATORY_CARE_PROVIDER_SITE_OTHER)
Admission: RE | Admit: 2018-01-08 | Discharge: 2018-01-08 | Disposition: A | Discharge status: Home / Self Care | Payer: Medicare Other | Source: Ambulatory Visit | Attending: Adult Health | Admitting: Adult Health

## 2018-01-08 ENCOUNTER — Ambulatory Visit (INDEPENDENT_AMBULATORY_CARE_PROVIDER_SITE_OTHER): Payer: Medicare Other | Admitting: Adult Health

## 2018-01-08 VITALS — BP 124/74 | HR 85 | Temp 98.4°F | Ht 65.0 in | Wt 170.4 lb

## 2018-01-08 DIAGNOSIS — J029 Acute pharyngitis, unspecified: Secondary | ICD-10-CM

## 2018-01-08 DIAGNOSIS — R0602 Shortness of breath: Secondary | ICD-10-CM | POA: Diagnosis not present

## 2018-01-08 DIAGNOSIS — J45909 Unspecified asthma, uncomplicated: Secondary | ICD-10-CM

## 2018-01-08 DIAGNOSIS — R52 Pain, unspecified: Secondary | ICD-10-CM | POA: Diagnosis not present

## 2018-01-08 DIAGNOSIS — R05 Cough: Secondary | ICD-10-CM | POA: Diagnosis not present

## 2018-01-08 LAB — BETA STREP SCREEN: Streptococcus, Group A Screen (Direct): NEGATIVE

## 2018-01-08 LAB — POCT INFLUENZA A/B
Influenza A, POC: NEGATIVE
Influenza B, POC: NEGATIVE

## 2018-01-08 MED ORDER — LEVALBUTEROL HCL 0.63 MG/3ML IN NEBU
0.6300 mg | INHALATION_SOLUTION | Freq: Once | RESPIRATORY_TRACT | Status: AC
  Administered 2018-01-08: 0.63 mg via RESPIRATORY_TRACT

## 2018-01-08 MED ORDER — BUDESONIDE-FORMOTEROL FUMARATE 80-4.5 MCG/ACT IN AERO
2.0000 | INHALATION_SPRAY | Freq: Two times a day (BID) | RESPIRATORY_TRACT | 0 refills | Status: DC
Start: 2018-01-08 — End: 2018-03-02

## 2018-01-08 MED ORDER — AMOXICILLIN-POT CLAVULANATE 875-125 MG PO TABS: 1.0000 | ORAL_TABLET | Freq: Two times a day (BID) | ORAL | 0 refills | Status: AC

## 2018-01-08 NOTE — Patient Instructions (Addendum)
Chest x-ray today Hold QVAR , begin Symbicort 80 2 puffs Twice daily  Until sample is gone then back to QVAR .  Mucinex Twice daily  As needed  Cough/congestion  Delsym 2 tsp Twice daily  As needed  Cough .  Augmentin 875mg  Twice daily  , take with food.  Strep test today  Please contact office for sooner follow up if symptoms do not improve or worsen or seek emergency care  Follow up with Dr. Halford Chessman  In 3 months and As needed

## 2018-01-08 NOTE — Progress Notes (Signed)
Reviewed and agree with assessment/plan.   Jonita Hirota, MD [facility] 10/22/2016, 12:24 PM Pager:  [phone]  

## 2018-01-08 NOTE — Addendum Note (Signed)
Addended by: Parke Poisson E on: 01/08/2018 05:13 PM   Modules accepted: Orders

## 2018-01-08 NOTE — Assessment & Plan Note (Signed)
Flare with bronchitis  Check cxr , and strep test xopenex neb x 1   Plan  Patient Instructions  Chest x-ray today Hold QVAR , begin Symbicort 80 2 puffs Twice daily  Until sample is gone then back to QVAR .  Mucinex Twice daily  As needed  Cough/congestion  Delsym 2 tsp Twice daily  As needed  Cough .  Augmentin 875mg  Twice daily  , take with food.  Strep test today  Please contact office for sooner follow up if symptoms do not improve or worsen or seek emergency care  Follow up with Dr. Halford Chessman  In 3 months and As needed

## 2018-01-08 NOTE — Progress Notes (Signed)
@Patient  ID: Angela Mendoza, female    DOB: 09/20/1951, 67 y.o.   MRN: [id number]  Chief Complaint  Patient presents with  . Acute Visit    Cough     Referring provider: Crecencio Mc, MD  HPI: 67 year old female former smoker followed for asthma and obstructive sleep apnea  Sleep tests PSG 09/06/07 >> AHI 19 CPAP 07/03/17 to 08/01/17 >> used on 30 of 30 nights with average 7 hrs 54 min.  Average AHI 0.6 with CPAP 13 cm H2O  Pulmonary tests Spirometry 12/12 >> FEV1% 68 RAST 11/15/14 >> IgE 30, cats/mold PFT 02/07/16 >> FEV1 2.32 (92%), FEV1% 76, FEF 25-75% 1.56 (71%), TLC 4.25 (81%), DLCO 121, borderline BD from FEF 25-75   01/08/2018 Acute OV : Asthma  Patient presents for persistent cough and wheezing.  Patient was seen 10 days ago for an acute asthmatic bronchitic exacerbation.  She was treated with a Z-Pak and a prednisone taper.  Patient says she is not feeling much better . Still having cough , congestion and sore throat. Did feel better briefly but then symptoms returned last several days. Using delsym .  Remains on Qvar and Singulair Was called in doxcycline but did not want to take so did not start.   Allergies  Allergen Reactions  . Codeine     Low blood pressure/nausea  . Sulfonamide Derivatives     rash  . Estradiol Rash    Generic estradiol patch d/t the adhesive    Immunization History  Administered Date(s) Administered  . Influenza Split 07/15/2013, 08/04/2017  . Influenza Whole 07/27/2009, 07/28/2011, 07/21/2012  . Influenza, High Dose Seasonal PF 07/22/2017  . Influenza,inj,Quad PF,6+ Mos 08/03/2014, 07/12/2015, 07/14/2016  . Influenza-Unspecified 07/15/2013, 08/03/2014, 07/12/2015, 07/14/2016, 08/04/2017  . Pneumococcal Conjugate-13 11/08/2014  . Pneumococcal Polysaccharide-23 07/27/2009, 12/27/2015  . Td 10/27/2008  . Tdap 12/19/2013, 02/25/2017  . Tetanus 10/27/2008  . Zoster 10/27/2009    Past Medical History:  Diagnosis Date  . ABNORMAL  HEART RHYTHMS 06/09/2008  . Allergic rhinitis 06/09/2008   chronic  . Asthma, intrinsic 10/06/2011   controlled on qvar. [name] 09/2011:  Very mild airflow obstruction (FEV1% 68, FVL c/w obstruction)   . Chronic headache 06/09/2008  . COPD (chronic obstructive pulmonary disease) ([place])   . Depression   . Diverticulosis 2013   h/o diverticulitis  . Dry eyes   . GERD (gastroesophageal reflux disease)   . History of breast implant removal    silicone mastitis - R axilla silicone LN, free silicone L breast upper outer quadrant  . Hot flashes   . HYPERLIPIDEMIA 06/09/2008  . HYPERTENSION 06/09/2008  . Hypothyroidism   . MVP (mitral valve prolapse)   . OSA on CPAP    [name]  . Pernicious anemia    per prior pcp  . Rosacea   . Surgical menopause 1991  . Vertigo   . Vitamin D deficiency     Tobacco History: Social History   Tobacco Use  Smoking Status Former Smoker  . Packs/day: 0.30  . Years: 7.00  . Pack years: 2.10  . Types: Cigarettes  . Last attempt to quit: 10/27/1973  . 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 Hx of children who are grown   Outpatient Encounter Medications as of 01/08/2018  Medication Sig  . albuterol (PROVENTIL HFA) 108 (90 Base) MCG/ACT inhaler Inhale 2 puffs into the lungs every 6 (six) hours as needed.  . ALPRAZolam (XANAX) 0.25 MG tablet TAKE 1 TABLET (0.25 MG TOTAL) BY MOUTH ONCE DAILY AS NEEDED FOR SLEEP.  [name] aspirin 81 MG tablet Take 81 mg by mouth daily.    [name] atorvastatin (LIPITOR) 40 MG tablet Take 1 tablet (40 mg total) by mouth daily.  . Azelastine-Fluticasone (DYMISTA) 137-50 MCG/ACT SUSP Place 1 spray into the nose 2 (two) times daily.  . B Complex Vitamins (VITAMIN-B COMPLEX) TABS Take 1 tablet by mouth daily.  . beclomethasone  (QVAR REDIHALER) 80 MCG/ACT inhaler Inhale 2 puffs into the lungs 2 (two) times daily.  . cholecalciferol (VITAMIN D) 1000 units tablet Take 1,000 Units by mouth daily.   [name] conjugated estrogens (PREMARIN) vaginal cream Place 1 Applicatorful vaginally 2 (two) times a week. 90 day supply,  Refill for one year  . cyanocobalamin (,VITAMIN B-12,) 1000 MCG/ML injection Inject 1 mL (1,000 mcg total) into the muscle every 30 (thirty) days.  . cycloSPORINE (RESTASIS) 0.05 % ophthalmic emulsion Place 1 drop into both eyes 2 (two) times daily.  . diazepam (VALIUM) 5 MG tablet TAKE 1 TABLET BY MOUTH EVERY 6 HOURS AS NEEDED FOR VERTIGO  . diclofenac sodium (VOLTAREN) 1 % GEL Apply 2 g topically 4 (four) times daily.  Mariane Baumgarten Calcium (STOOL SOFTENER PO) Take 1 tablet by mouth as needed.  [name] EPINEPHrine 0.3 mg/0.3 mL IJ SOAJ injection Inject 0.3 mLs (0.3 mg total) into the muscle once.  [name] estradiol (VIVELLE-DOT) 0.1 MG/24HR patch Place 1 patch (0.1 mg total) onto the skin 2 (two) times a week.  . famotidine (PEPCID) 20 MG tablet Take 20 mg by mouth daily as needed for heartburn or indigestion.  . fexofenadine (ALLEGRA ALLERGY) 180 MG tablet Take 1 tablet (180 mg total) by mouth daily.  . Levothyroxine Sodium 50 MCG CAPS Take 1 capsule (50 mcg total) by mouth daily.  [name] losartan (COZAAR) 50 MG tablet Take 1 tablet (50 mg total) by mouth daily.  . meclizine (ANTIVERT) 25 MG tablet Take 1 tablet (25 mg total) by mouth 3 (three) times daily as needed.  . metoprolol succinate (TOPROL-XL) 25 MG 24 hr tablet Take 3 tablets (75 mg total) by mouth daily.  . montelukast (SINGULAIR) 10 MG tablet Take 1 tablet (10 mg total) by mouth at bedtime.  [name] omega-3 acid ethyl esters (LOVAZA) 1 g capsule Take 1 capsule (1 g total) by mouth daily.  [name] omeprazole (PRILOSEC) 40 MG capsule Take 40 mg by mouth daily.  . ondansetron (ZOFRAN ODT) 4 MG disintegrating tablet Take 1 tablet (4 mg total) by mouth every 8 (eight) hours as needed  for nausea or vomiting.  . promethazine (PHENERGAN) 25 MG tablet Take 1 tablet (25 mg total) by mouth every 6 (six) hours as needed for nausea or vomiting.  [name] Propylene Glycol (SYSTANE BALANCE) 0.6 % SOLN Apply 1 drop to eye as needed.  . psyllium (METAMUCIL) 58.6 % powder Take 1 packet by mouth as needed.  [name] Spacer/Aero-Holding Chambers (AEROCHAMBER MV) inhaler Use as instructed  . Syringe/Needle, Disp, (SYRINGE 3CC/20GX1") 20G X 1" 3 ML MISC 1 Syringe by Does not apply route every 30 (thirty) days. For use with b-12 injection monthly  . tretinoin (RETIN-A) 0.01 % gel Apply topically at bedtime.  . urea (CARMOL) 10 % cream Apply topically as needed.  [name] amoxicillin-clavulanate (AUGMENTIN) 875-125 MG tablet  Take 1 tablet by mouth 2 (two) times daily for 7 days.  . benzonatate (TESSALON) 200 MG capsule Take 1 capsule (200 mg total) by mouth 3 (three) times daily as needed for cough. (Patient not taking: Reported on 01/08/2018)  . budesonide-formoterol (SYMBICORT) 80-4.5 MCG/ACT inhaler Inhale 2 puffs into the lungs every 12 (twelve) hours.  [name] doxycycline (VIBRA-TABS) 100 MG tablet Take 1 tablet (100 mg total) by mouth 2 (two) times daily. (Patient not taking: Reported on 01/08/2018)  . [DISCONTINUED] azithromycin (ZITHROMAX) 250 MG tablet Take 2 on day one then 1 daily x 4 days (Patient not taking: Reported on 01/08/2018)  . [DISCONTINUED] clotrimazole (MYCELEX) 10 MG troche Take 1 tablet (10 mg total) by mouth 5 (five) times daily. (Patient not taking: Reported on 01/08/2018)  . [DISCONTINUED] Lifitegrast (XIIDRA) 5 % SOLN Apply 1 drop to eye 2 (two) times daily.  . [DISCONTINUED] predniSONE (DELTASONE) 10 MG tablet Take  4 each am x 2 days,   2 each am x 2 days,  1 each am x 2 days and stop (Patient not taking: Reported on 01/08/2018)   No facility-administered encounter medications on file as of 01/08/2018.      Review of Systems  Constitutional:   No  weight loss, night sweats,  Fevers, chills,  fatigue, or  lassitude.  HEENT:   No headaches,  Difficulty swallowing,  Tooth/dental problems, or  Sore throat,                No sneezing, itching, ear ache, + nasal congestion, post nasal drip,   CV:  No chest pain,  Orthopnea, PND, swelling in lower extremities, anasarca, dizziness, palpitations, syncope.   GI  No heartburn, indigestion, abdominal pain, nausea, vomiting, diarrhea, change in bowel habits, loss of appetite, bloody stools.   Resp:   No chest wall deformity  Skin: no rash or lesions.  GU: no dysuria, change in color of urine, no urgency or frequency.  No flank pain, no hematuria   MS:  No joint pain or swelling.  No decreased range of motion.  No back pain.    Physical Exam  BP 124/74 (BP Location: Left Arm, Cuff Size: Normal)   Pulse 85   Temp 98.4 F (36.9 C) (Oral)   Ht 5\' 5"  (1.651 m)   Wt 170 lb 6.4 oz (77.3 kg)   SpO2 98%   BMI 28.36 kg/m   GEN: A/Ox3; pleasant , NAD, well nourished    HEENT:  [place]/AT,  EACs-clear, TMs-wnl, NOSE-clear, THROAT-clear, no lesions, no postnasal drip or exudate noted.   NECK:  Supple w/ fair ROM; no JVD; normal carotid impulses w/o bruits; no thyromegaly or nodules palpated; no lymphadenopathy.    RESP  Clear  P & A; w/o, wheezes/ rales/ or rhonchi. no accessory muscle use, no dullness to percussion  CARD:  RRR, no m/r/g, no peripheral edema, pulses intact, no cyanosis or clubbing.  GI:   Soft & nt; nml bowel sounds; no organomegaly or masses detected.   Musco: Warm bil, no deformities or joint swelling noted.   Neuro: alert, no focal deficits noted.    Skin: Warm, no lesions or rashes    Lab Results:  CBC    Component Value Date/Time   WBC 10.2 05/26/2017 1703   RBC 4.19 05/26/2017 1703   HGB 13.2 05/26/2017 1703   HGB 13.7 12/07/2012   HCT 40.0 05/26/2017 1703   PLT 376.0 05/26/2017 1703   MCV 95.4 05/26/2017 1703  MCHC 33.0 05/26/2017 1703   RDW 13.3 05/26/2017 1703   LYMPHSABS 3.0 05/26/2017 1703     MONOABS 0.8 05/26/2017 1703   EOSABS 0.1 05/26/2017 1703   BASOSABS 0.0 05/26/2017 1703    BMET    Component Value Date/Time   NA 138 08/28/2017 0919   NA 142 09/26/2016   K 4.2 08/28/2017 0919   CL 102 08/28/2017 0919   CO2 27 08/28/2017 0919   GLUCOSE 77 08/28/2017 0919   BUN 9 08/28/2017 0919   BUN 12 09/26/2016   CREATININE 0.77 08/28/2017 0919   CREATININE 0.96 04/12/2013   CALCIUM 9.5 08/28/2017 0919    BNP No results found for: BNP  ProBNP No results found for: PROBNP  Imaging: No results found.   Assessment & Plan:   Asthma, intrinsic Flare with bronchitis  Check cxr , and strep test xopenex neb x 1   Plan  Patient Instructions  Chest x-ray today Hold QVAR , begin Symbicort 80 2 puffs Twice daily  Until sample is gone then back to QVAR .  Mucinex Twice daily  As needed  Cough/congestion  Delsym 2 tsp Twice daily  As needed  Cough .  Augmentin 875mg  Twice daily  , take with food.  Strep test today  Please contact office for sooner follow up if symptoms do not improve or worsen or seek emergency care  Follow up with Dr. Halford Chessman  In 3 months and As needed              Rexene Edison, NP 01/08/2018

## 2018-01-11 ENCOUNTER — Telehealth: Payer: Self-pay | Admitting: Adult Health

## 2018-01-11 NOTE — Telephone Encounter (Signed)
Per TP: results are in epic now - strep test was negative

## 2018-01-11 NOTE — Telephone Encounter (Signed)
Patient aware nothing further needed. 

## 2018-01-11 NOTE — Telephone Encounter (Signed)
TP please advise patient is asking that we call her back with results from the strep test as well as the cxr. thanks

## 2018-01-21 ENCOUNTER — Telehealth: Payer: Self-pay | Admitting: Internal Medicine

## 2018-01-21 MED ORDER — FLUCONAZOLE 100 MG PO TABS: 100.0000 mg | ORAL_TABLET | Freq: Every day | ORAL | 0 refills | Status: DC

## 2018-01-21 NOTE — Telephone Encounter (Signed)
Diflucan 100 mg x one tablet and it should gradually clear by next week and if not call back

## 2018-01-21 NOTE — Telephone Encounter (Signed)
Dr. Melvyn Novas please advise on how this needs to be taken and what quantity needs to be sent in. thanks

## 2018-01-21 NOTE — Telephone Encounter (Signed)
Called and spoke with pt letting her know we were sending script of diflucan to pt's pharmacy with 1 tablet, and that the infection should gradually clear up with that one dose.  Stated to pt if infection was not gone by mid next week around Wednesday to call our office back and then we could discuss with MW further recommendations.  Pt expressed understanding. Verified pt's pharmacy and sent script to pt's pharmacy.  Nothing further needed at this time.

## 2018-01-21 NOTE — Telephone Encounter (Signed)
Take one PO x one dose only

## 2018-01-21 NOTE — Telephone Encounter (Signed)
Called and spoke with patient, she states that was placed on two antibiotics, Zpak and Amoxicillin. She finished both of the medications on 3.23.19. Since taking and finishing the medications she has had symptoms of a yeast infection for two days. Symptoms include burning, itching, odor, and dryness. She is asking for something to be called in for this.   MW please advise.

## 2018-02-03 ENCOUNTER — Other Ambulatory Visit: Payer: Self-pay | Admitting: Vascular Surgery

## 2018-02-03 DIAGNOSIS — Z1231 Encounter for screening mammogram for malignant neoplasm of breast: Secondary | ICD-10-CM

## 2018-02-11 ENCOUNTER — Other Ambulatory Visit: Payer: Self-pay

## 2018-02-11 ENCOUNTER — Encounter: Payer: Self-pay | Admitting: Pulmonary Disease

## 2018-02-11 ENCOUNTER — Ambulatory Visit (INDEPENDENT_AMBULATORY_CARE_PROVIDER_SITE_OTHER): Payer: Medicare Other | Admitting: Pulmonary Disease

## 2018-02-11 VITALS — BP 132/88 | HR 65 | Temp 98.7°F | Ht 65.0 in | Wt 175.2 lb

## 2018-02-11 DIAGNOSIS — B370 Candidal stomatitis: Secondary | ICD-10-CM

## 2018-02-11 DIAGNOSIS — J45909 Unspecified asthma, uncomplicated: Secondary | ICD-10-CM

## 2018-02-11 DIAGNOSIS — R059 Cough, unspecified: Secondary | ICD-10-CM

## 2018-02-11 DIAGNOSIS — R05 Cough: Secondary | ICD-10-CM

## 2018-02-11 DIAGNOSIS — R058 Other specified cough: Secondary | ICD-10-CM

## 2018-02-11 MED ORDER — ALBUTEROL SULFATE HFA 108 (90 BASE) MCG/ACT IN AERS: 2.0000 | INHALATION_SPRAY | Freq: Four times a day (QID) | RESPIRATORY_TRACT | 3 refills | Status: DC | PRN

## 2018-02-11 MED ORDER — BECLOMETHASONE DIPROP HFA 80 MCG/ACT IN AERB: 2.0000 | INHALATION_SPRAY | Freq: Two times a day (BID) | RESPIRATORY_TRACT | 3 refills | Status: DC

## 2018-02-11 MED ORDER — AZELASTINE-FLUTICASONE 137-50 MCG/ACT NA SUSP: 1.0000 | Freq: Two times a day (BID) | NASAL | 3 refills | Status: DC

## 2018-02-11 MED ORDER — FLUCONAZOLE 100 MG PO TABS: 100.0000 mg | ORAL_TABLET | Freq: Every day | ORAL | 0 refills | Status: AC

## 2018-02-11 NOTE — Progress Notes (Signed)
Synopsis: Patient of Dr. Halford Chessman with asthma, recurrent exacerbations of the same  Subjective:   PATIENT ID: Angela Mendoza GENDER: female DOB: 06-11-1951, MRN: [id number]   HPI  Chief Complaint  Patient presents with  . Follow-up    lingering cough, chest tightness, occ SOB , still having oral thrush     Angela Mendoza says that she has a cough, dyspnea and fatigue.  She has had repeated exacerbatoins of her asthma this year and she feels like she is not getteing better.  She says that she coughs more just before using her inhaler.  She has a "light cough" but sometimes a more "deep cough" with mucus production.  She has some sinus congestion and post nasal drip.    She is currently taking singulair, QVar and a rescue inhaler.  She has been using Symbicort.  She has been using Delsym some as well.  Delsym helps.  She has been taking chlorpheniramine some which helps with her post nasal drip.  She is not taking mucinex anymore.  She takes Dymista regularly.  She needs a Dymista refill.    Her predominant problem is cough > worse when she gets up in the mornings > no change with eating > talking more and taking a deep breath make sit worse  GERD: > takes omeprazole  She has had thrush from time to time lately.  Past Medical History:  Diagnosis Date  . ABNORMAL HEART RHYTHMS 06/09/2008  . Allergic rhinitis 06/09/2008   chronic  . Asthma, intrinsic 10/06/2011   controlled on qvar. [name] 09/2011:  Very mild airflow obstruction (FEV1% 68, FVL c/w obstruction)   . Chronic headache 06/09/2008  . COPD (chronic obstructive pulmonary disease) ([place])   . Depression   . Diverticulosis 2013   h/o diverticulitis  . Dry eyes   . GERD (gastroesophageal reflux disease)   . History of breast implant removal    silicone mastitis - R axilla silicone LN, free silicone L breast upper outer quadrant  . Hot flashes   . HYPERLIPIDEMIA 06/09/2008  . HYPERTENSION 06/09/2008  . Hypothyroidism   . MVP (mitral  valve prolapse)   . OSA on CPAP    [name]  . Pernicious anemia    per prior pcp  . Rosacea   . Surgical menopause 1991  . Vertigo   . Vitamin D deficiency      Family History  Problem Relation Age of Onset  . Cancer Mother 60       lung, passive smoke  . Cancer Father 2       lung, smoker  . Cancer Paternal Aunt        breast, lung  . Cancer Cousin        breast  . Stroke Sister        TIA  . Hypertension Sister   . Heart failure Sister   . Alzheimer's disease Paternal Aunt   . Aortic dissection Paternal Uncle   . Aortic dissection Maternal Aunt   . Breast cancer Maternal Aunt      Social History   Socioeconomic History  . Marital status: Married    Spouse name: Not on file  . Number of children: Not on file  . Years of education: Not on file  . Highest education level: Not on file  Occupational History  . Occupation: retired  Scientific laboratory technician  . Financial resource strain: Not on file  . Food insecurity:    Worry: Not on file  Inability: Not on file  . Transportation needs:    Medical: Not on file    Non-medical: Not on file  Tobacco Use  . Smoking status: Former Smoker    Packs/day: 0.30    Years: 7.00    Pack years: 2.10    Types: Cigarettes    Last attempt to quit: 10/27/1973    Years since quitting: 44.3  . Smokeless tobacco: Never Used  . 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 Hx of children who are grown  Substance and Sexual Activity  . Alcohol use: Yes    Alcohol/week: 0.0 oz    Comment: very rare  . Drug use: No  . Sexual activity: Not on file  Lifestyle  . Physical activity:    Days per week: Not on file    Minutes per session: Not on file  . Stress: Not on file  Relationships  . Social connections:    Talks on phone: Not on file    Gets together: Not on file    Attends religious service: Not on file    Active member of club or organization: Not on file    Attends meetings of clubs or organizations:  Not on file    Relationship status: Not on file  . Intimate partner violence:    Fear of current or ex partner: Not on file    Emotionally abused: Not on file    Physically abused: Not on file    Forced sexual activity: Not on file  Other Topics Concern  . Not on file  Social History Narrative   Lives with husband, cat   Grown children   Occ: retired, was Gaffer   Edu: trade degree then 2 yrscollege     Allergies  Allergen Reactions  . Codeine     Low blood pressure/nausea  . Sulfonamide Derivatives     rash  . Estradiol Rash    Generic estradiol patch d/t the adhesive     Outpatient Medications Prior to Visit  Medication Sig Dispense Refill  . albuterol (PROVENTIL HFA) 108 (90 Base) MCG/ACT inhaler Inhale 2 puffs into the lungs every 6 (six) hours as needed. 3 Inhaler 3  . ALPRAZolam (XANAX) 0.25 MG tablet TAKE 1 TABLET (0.25 MG TOTAL) BY MOUTH ONCE DAILY AS NEEDED FOR SLEEP. 30 tablet 2  . aspirin 81 MG tablet Take 81 mg by mouth daily.      [name] atorvastatin (LIPITOR) 40 MG tablet Take 1 tablet (40 mg total) by mouth daily. 90 tablet 3  . B Complex Vitamins (VITAMIN-B COMPLEX) TABS Take 1 tablet by mouth daily.    . beclomethasone (QVAR REDIHALER) 80 MCG/ACT inhaler Inhale 2 puffs into the lungs 2 (two) times daily. 3 Inhaler 3  . benzonatate (TESSALON) 200 MG capsule Take 1 capsule (200 mg total) by mouth 3 (three) times daily as needed for cough. 30 capsule 1  . cholecalciferol (VITAMIN D) 1000 units tablet Take 1,000 Units by mouth daily.     [name] conjugated estrogens (PREMARIN) vaginal cream Place 1 Applicatorful vaginally 2 (two) times a week. 90 day supply,  Refill for one year 90 g 3  . cyanocobalamin (,VITAMIN B-12,) 1000 MCG/ML injection Inject 1 mL (1,000 mcg total) into the muscle every 30 (thirty) days. 3 mL 3  . cycloSPORINE (RESTASIS) 0.05 % ophthalmic emulsion Place 1 drop into both eyes 2 (two) times daily. 3 each 3  . diazepam (VALIUM) 5 MG tablet TAKE 1  TABLET BY MOUTH EVERY 6 HOURS AS NEEDED FOR VERTIGO 30 tablet 1  . diclofenac sodium (VOLTAREN) 1 % GEL Apply 2 g topically 4 (four) times daily. 100 g 3  . Docusate Calcium (STOOL SOFTENER PO) Take 1 tablet by mouth as needed.    [name] EPINEPHrine 0.3 mg/0.3 mL IJ SOAJ injection Inject 0.3 mLs (0.3 mg total) into the muscle once. 2 Device 0  . estradiol (VIVELLE-DOT) 0.1 MG/24HR patch Place 1 patch (0.1 mg total) onto the skin 2 (two) times a week. 24 patch 3  . famotidine (PEPCID) 20 MG tablet Take 20 mg by mouth daily as needed for heartburn or indigestion.    . fexofenadine (ALLEGRA ALLERGY) 180 MG tablet Take 1 tablet (180 mg total) by mouth daily. 90 tablet 3  . fluconazole (DIFLUCAN) 100 MG tablet Take 1 tablet (100 mg total) by mouth daily. 1 tablet 0  . Levothyroxine Sodium 50 MCG CAPS Take 1 capsule (50 mcg total) by mouth daily. 90 capsule 3  . losartan (COZAAR) 50 MG tablet Take 1 tablet (50 mg total) by mouth daily. 90 tablet 3  . meclizine (ANTIVERT) 25 MG tablet Take 1 tablet (25 mg total) by mouth 3 (three) times daily as needed. 270 tablet 1  . metoprolol succinate (TOPROL-XL) 25 MG 24 hr tablet Take 3 tablets (75 mg total) by mouth daily. 270 tablet 3  . montelukast (SINGULAIR) 10 MG tablet Take 1 tablet (10 mg total) by mouth at bedtime. 90 tablet 3  . omega-3 acid ethyl esters (LOVAZA) 1 g capsule Take 1 capsule (1 g total) by mouth daily. 90 capsule 3  . omeprazole (PRILOSEC) 40 MG capsule Take 40 mg by mouth daily.    . ondansetron (ZOFRAN ODT) 4 MG disintegrating tablet Take 1 tablet (4 mg total) by mouth every 8 (eight) hours as needed for nausea or vomiting. 20 tablet 0  . promethazine (PHENERGAN) 25 MG tablet Take 1 tablet (25 mg total) by mouth every 6 (six) hours as needed for nausea or vomiting. 90 tablet 3  . Propylene Glycol (SYSTANE BALANCE) 0.6 % SOLN Apply 1 drop to eye as needed.    . psyllium (METAMUCIL) 58.6 % powder Take 1 packet by mouth as needed.    [name]  Spacer/Aero-Holding Chambers (AEROCHAMBER MV) inhaler Use as instructed 1 each 0  . Syringe/Needle, Disp, (SYRINGE 3CC/20GX1") 20G X 1" 3 ML MISC 1 Syringe by Does not apply route every 30 (thirty) days. For use with b-12 injection monthly 50 each 0  . tretinoin (RETIN-A) 0.01 % gel Apply topically at bedtime. 45 g 3  . urea (CARMOL) 10 % cream Apply topically as needed. 90 g 3  . Azelastine-Fluticasone (DYMISTA) 137-50 MCG/ACT SUSP Place 1 spray into the nose 2 (two) times daily. 69 g 3  . budesonide-formoterol (SYMBICORT) 80-4.5 MCG/ACT inhaler Inhale 2 puffs into the lungs every 12 (twelve) hours. (Patient not taking: Reported on 02/11/2018) 1 Inhaler 0  . doxycycline (VIBRA-TABS) 100 MG tablet Take 1 tablet (100 mg total) by mouth 2 (two) times daily. (Patient not taking: Reported on 01/08/2018) 14 tablet 0   No facility-administered medications prior to visit.     Review of Systems  Constitutional: Positive for malaise/fatigue. Negative for chills and diaphoresis.  HENT: Positive for congestion and sore throat. Negative for sinus pain.   Respiratory: Positive for cough and shortness of breath. Negative for sputum production and stridor.   Cardiovascular: Negative for claudication and leg swelling.  Objective:  Physical Exam   Vitals:   02/11/18 1210 02/11/18 1211 02/11/18 1212  BP:   132/88  Pulse:  65   Temp:  98.7 F (37.1 C)   TempSrc:  Oral   SpO2:  100%   Weight: 175 lb 3.2 oz (79.5 kg)    Height: 5\' 5"  (1.651 m)      Gen: well appearing HENT: OP with some thrush noted on soft pallate, neck supple PULM: CTA B, normal percussion CV: RRR, no mgr, trace edema GI: BS+, soft, nontender Derm: no cyanosis or rash Psyche: normal mood and affect   CBC    Component Value Date/Time   WBC 10.2 05/26/2017 1703   RBC 4.19 05/26/2017 1703   HGB 13.2 05/26/2017 1703   HGB 13.7 12/07/2012   HCT 40.0 05/26/2017 1703   PLT 376.0 05/26/2017 1703   MCV 95.4 05/26/2017  1703   MCHC 33.0 05/26/2017 1703   RDW 13.3 05/26/2017 1703   LYMPHSABS 3.0 05/26/2017 1703   MONOABS 0.8 05/26/2017 1703   EOSABS 0.1 05/26/2017 1703   BASOSABS 0.0 05/26/2017 1703     Chest imaging:  PFT: 2017 lung function testing reviewed showing no airflow obstruction  Labs:  Path:  Echo:  Heart Catheterization:  Multiple records from our office reviewed including the most recent exacerbation in mid march, Symbicort was added to replace QVar     Assessment & Plan:   Asthma, intrinsic - Plan: Nitric oxide, CANCELED: POCT EXHALED NITRIC OXIDE  Cough - Plan: CANCELED: POCT EXHALED NITRIC OXIDE  Upper airway cough syndrome  Oral candidiasis  Discussion: 67 year old female with a diagnosis of asthma comes to my clinic today for cough and a sore throat related to thrush.  Lungs are clear on exam without evidence of an asthma exacerbation.  She has persistent cough due to laryngeal irritation from her recent upper airway infections.  This is exacerbated by thrush, postnasal drip, and allergic rhinitis.  Plan: Cough due to laryngeal irritation and inflammation: Take Delsym twice a day You need to try to suppress your cough to allow your larynx (voice box) to heal.  For three days don't talk, laugh, sing, or clear your throat. Do everything you can to suppress the cough during this time. Use hard candies (sugarless Jolly Ranchers) or non-mint or non-menthol containing cough drops during this time to soothe your throat.  Use a cough suppressant (Delsym or what I have prescribed you) around the clock during this time.  After three days, gradually increase the use of your voice and back off on the cough suppressants.  Oral thrush: Diflucan 100 mg daily times 5 days  Allergic rhinitis: Continue Dymista Continue Chlor-Trimeton  Gastroesophageal reflux disease: Continue omeprazole  Follow-up with Dr. Halford Chessman as previously arranged    Current Outpatient Medications:  .   albuterol (PROVENTIL HFA) 108 (90 Base) MCG/ACT inhaler, Inhale 2 puffs into the lungs every 6 (six) hours as needed., Disp: 3 Inhaler, Rfl: 3 .  ALPRAZolam (XANAX) 0.25 MG tablet, TAKE 1 TABLET (0.25 MG TOTAL) BY MOUTH ONCE DAILY AS NEEDED FOR SLEEP., Disp: 30 tablet, Rfl: 2 .  aspirin 81 MG tablet, Take 81 mg by mouth daily.  , Disp: , Rfl:  .  atorvastatin (LIPITOR) 40 MG tablet, Take 1 tablet (40 mg total) by mouth daily., Disp: 90 tablet, Rfl: 3 .  Azelastine-Fluticasone (DYMISTA) 137-50 MCG/ACT SUSP, Place 1 spray into the nose 2 (two) times daily., Disp: 3 Bottle, Rfl: 3 .  B  Complex Vitamins (VITAMIN-B COMPLEX) TABS, Take 1 tablet by mouth daily., Disp: , Rfl:  .  beclomethasone (QVAR REDIHALER) 80 MCG/ACT inhaler, Inhale 2 puffs into the lungs 2 (two) times daily., Disp: 3 Inhaler, Rfl: 3 .  benzonatate (TESSALON) 200 MG capsule, Take 1 capsule (200 mg total) by mouth 3 (three) times daily as needed for cough., Disp: 30 capsule, Rfl: 1 .  cholecalciferol (VITAMIN D) 1000 units tablet, Take 1,000 Units by mouth daily. , Disp: , Rfl:  .  conjugated estrogens (PREMARIN) vaginal cream, Place 1 Applicatorful vaginally 2 (two) times a week. 90 day supply,  Refill for one year, Disp: 90 g, Rfl: 3 .  cyanocobalamin (,VITAMIN B-12,) 1000 MCG/ML injection, Inject 1 mL (1,000 mcg total) into the muscle every 30 (thirty) days., Disp: 3 mL, Rfl: 3 .  cycloSPORINE (RESTASIS) 0.05 % ophthalmic emulsion, Place 1 drop into both eyes 2 (two) times daily., Disp: 3 each, Rfl: 3 .  diazepam (VALIUM) 5 MG tablet, TAKE 1 TABLET BY MOUTH EVERY 6 HOURS AS NEEDED FOR VERTIGO, Disp: 30 tablet, Rfl: 1 .  diclofenac sodium (VOLTAREN) 1 % GEL, Apply 2 g topically 4 (four) times daily., Disp: 100 g, Rfl: 3 .  Docusate Calcium (STOOL SOFTENER PO), Take 1 tablet by mouth as needed., Disp: , Rfl:  .  EPINEPHrine 0.3 mg/0.3 mL IJ SOAJ injection, Inject 0.3 mLs (0.3 mg total) into the muscle once., Disp: 2 Device, Rfl: 0 .   estradiol (VIVELLE-DOT) 0.1 MG/24HR patch, Place 1 patch (0.1 mg total) onto the skin 2 (two) times a week., Disp: 24 patch, Rfl: 3 .  famotidine (PEPCID) 20 MG tablet, Take 20 mg by mouth daily as needed for heartburn or indigestion., Disp: , Rfl:  .  fexofenadine (ALLEGRA ALLERGY) 180 MG tablet, Take 1 tablet (180 mg total) by mouth daily., Disp: 90 tablet, Rfl: 3 .  fluconazole (DIFLUCAN) 100 MG tablet, Take 1 tablet (100 mg total) by mouth daily., Disp: 1 tablet, Rfl: 0 .  Levothyroxine Sodium 50 MCG CAPS, Take 1 capsule (50 mcg total) by mouth daily., Disp: 90 capsule, Rfl: 3 .  losartan (COZAAR) 50 MG tablet, Take 1 tablet (50 mg total) by mouth daily., Disp: 90 tablet, Rfl: 3 .  meclizine (ANTIVERT) 25 MG tablet, Take 1 tablet (25 mg total) by mouth 3 (three) times daily as needed., Disp: 270 tablet, Rfl: 1 .  metoprolol succinate (TOPROL-XL) 25 MG 24 hr tablet, Take 3 tablets (75 mg total) by mouth daily., Disp: 270 tablet, Rfl: 3 .  montelukast (SINGULAIR) 10 MG tablet, Take 1 tablet (10 mg total) by mouth at bedtime., Disp: 90 tablet, Rfl: 3 .  omega-3 acid ethyl esters (LOVAZA) 1 g capsule, Take 1 capsule (1 g total) by mouth daily., Disp: 90 capsule, Rfl: 3 .  omeprazole (PRILOSEC) 40 MG capsule, Take 40 mg by mouth daily., Disp: , Rfl:  .  ondansetron (ZOFRAN ODT) 4 MG disintegrating tablet, Take 1 tablet (4 mg total) by mouth every 8 (eight) hours as needed for nausea or vomiting., Disp: 20 tablet, Rfl: 0 .  promethazine (PHENERGAN) 25 MG tablet, Take 1 tablet (25 mg total) by mouth every 6 (six) hours as needed for nausea or vomiting., Disp: 90 tablet, Rfl: 3 .  Propylene Glycol (SYSTANE BALANCE) 0.6 % SOLN, Apply 1 drop to eye as needed., Disp: , Rfl:  .  psyllium (METAMUCIL) 58.6 % powder, Take 1 packet by mouth as needed., Disp: , Rfl:  .  Spacer/Aero-Holding Chambers (AEROCHAMBER MV) inhaler, Use as instructed, Disp: 1 each, Rfl: 0 .  Syringe/Needle, Disp, (SYRINGE 3CC/20GX1") 20G  X 1" 3 ML MISC, 1 Syringe by Does not apply route every 30 (thirty) days. For use with b-12 injection monthly, Disp: 50 each, Rfl: 0 .  tretinoin (RETIN-A) 0.01 % gel, Apply topically at bedtime., Disp: 45 g, Rfl: 3 .  urea (CARMOL) 10 % cream, Apply topically as needed., Disp: 90 g, Rfl: 3 .  budesonide-formoterol (SYMBICORT) 80-4.5 MCG/ACT inhaler, Inhale 2 puffs into the lungs every 12 (twelve) hours. (Patient not taking: Reported on 02/11/2018), Disp: 1 Inhaler, Rfl: 0 .  fluconazole (DIFLUCAN) 100 MG tablet, Take 1 tablet (100 mg total) by mouth daily for 5 days., Disp: 5 tablet, Rfl: 0

## 2018-02-11 NOTE — Patient Instructions (Signed)
Cough due to laryngeal irritation and inflammation: Take Delsym twice a day You need to try to suppress your cough to allow your larynx (voice box) to heal.  For three days don't talk, laugh, sing, or clear your throat. Do everything you can to suppress the cough during this time. Use hard candies (sugarless Jolly Ranchers) or non-mint or non-menthol containing cough drops during this time to soothe your throat.  Use a cough suppressant (Delsym or what I have prescribed you) around the clock during this time.  After three days, gradually increase the use of your voice and back off on the cough suppressants.  Oral thrush: Diflucan 100 mg daily times 5 days  Allergic rhinitis: Continue Dymista Continue Chlor-Trimeton  Gastroesophageal reflux disease: Continue omeprazole  Follow-up with Dr. Halford Chessman as previously arranged

## 2018-02-22 ENCOUNTER — Telehealth: Payer: Self-pay | Admitting: Pulmonary Disease

## 2018-02-22 NOTE — Telephone Encounter (Signed)
Called and spoke with patient, she states that the medication for her thrush did not work. Patient is wanting something else to help.   BQ please advise, thanks.

## 2018-02-23 MED ORDER — NYSTATIN 100000 UNIT/ML MT SUSP: 5.0000 mL | Freq: Four times a day (QID) | OROMUCOSAL | 0 refills | Status: DC

## 2018-02-23 NOTE — Telephone Encounter (Signed)
lmtcb for pt.  Will call in rx after speaking to pt.

## 2018-02-23 NOTE — Telephone Encounter (Signed)
Nystatin swish and spit. If no improvement then follow up with PCP, Dr. Halford Chessman or an NP.

## 2018-02-23 NOTE — Telephone Encounter (Signed)
Pt called back, aware of recs.  rx sent to preferred pharmacy.  Pt also requesting to speak to Kathlee Nations- states that she had a strep test that was billed as "test for other organisms" and her insurance will not cover this.   Kathlee Nations please advise- thanks!

## 2018-03-01 ENCOUNTER — Telehealth: Payer: Self-pay | Admitting: Internal Medicine

## 2018-03-01 ENCOUNTER — Ambulatory Visit: Payer: Self-pay

## 2018-03-01 NOTE — Telephone Encounter (Signed)
Copied from [place] [id number]. Topic: Quick Communication - See Telephone Encounter >> Mar 01, 2018  5:23 PM Rutherford Nail, NT wrote: CRM for notification. See Telephone encounter for: 03/01/18. Patient calling and states that Dr Lake Bells at [facility] diagnosed her with oral thrush. States he prescribed her medication, but instead of the 10 day treatment, he only did 5 days. States that she has called them to see about getting another 5 days worth of medication, but they told her to contact her PCP. States that the oral thrush is not completely cleared up. That she has a small amount at the back of her tongue. Please advise. CB#: [phone]

## 2018-03-01 NOTE — Telephone Encounter (Signed)
Patient returned phone call. °

## 2018-03-01 NOTE — Telephone Encounter (Signed)
Patient called in with c/o "dizziness." She says "last week, with the last episode on Saturday, I had 4 episodes of dizziness. It just came on all of a sudden, then I had to sit down to keep from falling. After resting for a while, it passed. I also have thrush and was on a swish medicine for 5 days and the doctor who prescribed it told me to call back if it didn't resolve and he would order 5 more days. When I called, they said to call my PCP. I'm thinking the dizziness is from my blood pressure medication needing adjusting. I didn't have any nausea, vomiting, chest pain, shortness of breath." According to protocol, see PCP within 3 days, no availability with PCP, appointment scheduled for Wednesday, 03/03/18 at 1430 with [name], FNP, care advice given, patient verbalized understanding.   Reason for Disposition . [1] MILD dizziness (e.g., walking normally) AND [2] has NOT been evaluated by physician for this  (Exception: dizziness caused by heat exposure, sudden standing, or poor fluid intake)  Answer Assessment - Initial Assessment Questions 1. DESCRIPTION: "Describe your dizziness."     Felt faint 2. LIGHTHEADED: "Do you feel lightheaded?" (e.g., somewhat faint, woozy, weak upon standing)     Faint 3. VERTIGO: "Do you feel like either you or the room is spinning or tilting?" (i.e. vertigo)     No 4. SEVERITY: "How bad is it?"  "Do you feel like you are going to faint?" "Can you stand and walk?"   - MILD - walking normally   - MODERATE - interferes with normal activities (e.g., work, school)    - SEVERE - unable to stand, requires support to walk, feels like passing out now.      Mild 5. ONSET:  "When did the dizziness begin?"    4 times last week with the last time on Saturday 6. AGGRAVATING FACTORS: "Does anything make it worse?" (e.g., standing, change in head position)     It just happened  7. HEART RATE: "Can you tell me your heart rate?" "How many beats in 15 seconds?"  (Note:  not all patients can do this)      No 8. CAUSE: "What do you think is causing the dizziness?"     Maybe my BP medication 9. RECURRENT SYMPTOM: "Have you had dizziness before?" If so, ask: "When was the last time?" "What happened that time?"     No 10. OTHER SYMPTOMS: "Do you have any other symptoms?" (e.g., fever, chest pain, vomiting, diarrhea, bleeding)      None 11. PREGNANCY: "Is there any chance you are pregnant?" "When was your last menstrual period?"      No  Protocols used: DIZZINESS [facility]

## 2018-03-01 NOTE — Telephone Encounter (Signed)
Pt needs a refill of Nystatin [facility]  [phone]

## 2018-03-01 NOTE — Telephone Encounter (Signed)
Kathlee Nations, have you been able to contact this patient, please advise. Thank you!

## 2018-03-01 NOTE — Telephone Encounter (Signed)
Called patient unable to reach. Left message to give [place] a call back. Per BQ last phone note on this patient is to defer to PCP if not better.

## 2018-03-01 NOTE — Telephone Encounter (Signed)
CB for patient is [phone]

## 2018-03-01 NOTE — Telephone Encounter (Signed)
Called and spoke with patient, she states that she will contact her PCP. Nothing further needed.

## 2018-03-02 ENCOUNTER — Encounter: Payer: Self-pay | Admitting: Internal Medicine

## 2018-03-02 ENCOUNTER — Ambulatory Visit (INDEPENDENT_AMBULATORY_CARE_PROVIDER_SITE_OTHER): Payer: Medicare Other | Admitting: Internal Medicine

## 2018-03-02 DIAGNOSIS — B370 Candidal stomatitis: Secondary | ICD-10-CM

## 2018-03-02 MED ORDER — NYSTATIN 100000 UNIT/ML MT SUSP: 5.0000 mL | Freq: Four times a day (QID) | OROMUCOSAL | 0 refills | Status: DC

## 2018-03-02 NOTE — Telephone Encounter (Signed)
Pt has been worked in for 2:00 this afternoon per Dr. Derrel Nip.

## 2018-03-02 NOTE — Progress Notes (Signed)
Subjective:  Patient ID: Angela Mendoza, female    DOB: 25-May-1951  Age: 67 y.o. MRN: [id number]  CC: The encounter diagnosis was Oral candidiasis.  HPI Angela Mendoza presents for concerns about persistent oral trush.  Pateint was initia  Treated for 5 days with nystatin after use  Of inhaled steroids   Had a viral URI in February,  Was treated multiple times for persistent /recurrent  cough.     Has been on multiple rounds of abx.  Treated initially for thrush with mycelex troches x 3 days,  No resolution , Followed by 1 day of fluconazole march 28  Followed by 5 days of  Nystatin for thrush and vaginitis but was told by pharmacist that her treatment regimens previously prescribed were too short . HAS NOT BEEN SWALLOWING THE NYSTATIN.  Stopped using Dymista spray and Symbicort.Qvar a week ago   Outpatient Medications Prior to Visit  Medication Sig Dispense Refill  . albuterol (PROVENTIL HFA) 108 (90 Base) MCG/ACT inhaler Inhale 2 puffs into the lungs every 6 (six) hours as needed. 3 Inhaler 3  . ALPRAZolam (XANAX) 0.25 MG tablet TAKE 1 TABLET (0.25 MG TOTAL) BY MOUTH ONCE DAILY AS NEEDED FOR SLEEP. 30 tablet 2  . aspirin 81 MG tablet Take 81 mg by mouth daily.      [name] atorvastatin (LIPITOR) 40 MG tablet Take 1 tablet (40 mg total) by mouth daily. 90 tablet 3  . Azelastine-Fluticasone (DYMISTA) 137-50 MCG/ACT SUSP Place 1 spray into the nose 2 (two) times daily. 3 Bottle 3  . B Complex Vitamins (VITAMIN-B COMPLEX) TABS Take 1 tablet by mouth daily.    . beclomethasone (QVAR REDIHALER) 80 MCG/ACT inhaler Inhale 2 puffs into the lungs 2 (two) times daily. 3 Inhaler 3  . cholecalciferol (VITAMIN D) 1000 units tablet Take 1,000 Units by mouth daily.     [name] conjugated estrogens (PREMARIN) vaginal cream Place 1 Applicatorful vaginally 2 (two) times a week. 90 day supply,  Refill for one year 90 g 3  . cyanocobalamin (,VITAMIN B-12,) 1000 MCG/ML injection Inject 1 mL (1,000 mcg total) into the  muscle every 30 (thirty) days. 3 mL 3  . cycloSPORINE (RESTASIS) 0.05 % ophthalmic emulsion Place 1 drop into both eyes 2 (two) times daily. 3 each 3  . diazepam (VALIUM) 5 MG tablet TAKE 1 TABLET BY MOUTH EVERY 6 HOURS AS NEEDED FOR VERTIGO 30 tablet 1  . diclofenac sodium (VOLTAREN) 1 % GEL Apply 2 g topically 4 (four) times daily. 100 g 3  . Docusate Calcium (STOOL SOFTENER PO) Take 1 tablet by mouth as needed.    [name] estradiol (VIVELLE-DOT) 0.1 MG/24HR patch Place 1 patch (0.1 mg total) onto the skin 2 (two) times a week. 24 patch 3  . famotidine (PEPCID) 20 MG tablet Take 20 mg by mouth daily as needed for heartburn or indigestion.    . fexofenadine (ALLEGRA ALLERGY) 180 MG tablet Take 1 tablet (180 mg total) by mouth daily. 90 tablet 3  . Levothyroxine Sodium 50 MCG CAPS Take 1 capsule (50 mcg total) by mouth daily. 90 capsule 3  . meclizine (ANTIVERT) 25 MG tablet Take 1 tablet (25 mg total) by mouth 3 (three) times daily as needed. 270 tablet 1  . metoprolol succinate (TOPROL-XL) 25 MG 24 hr tablet Take 3 tablets (75 mg total) by mouth daily. 270 tablet 3  . montelukast (SINGULAIR) 10 MG tablet Take 1 tablet (10 mg total) by mouth at bedtime. [place]  tablet 3  . omega-3 acid ethyl esters (LOVAZA) 1 g capsule Take 1 capsule (1 g total) by mouth daily. 90 capsule 3  . omeprazole (PRILOSEC) 40 MG capsule Take 40 mg by mouth daily.    . ondansetron (ZOFRAN ODT) 4 MG disintegrating tablet Take 1 tablet (4 mg total) by mouth every 8 (eight) hours as needed for nausea or vomiting. 20 tablet 0  . promethazine (PHENERGAN) 25 MG tablet Take 1 tablet (25 mg total) by mouth every 6 (six) hours as needed for nausea or vomiting. 90 tablet 3  . Propylene Glycol (SYSTANE BALANCE) 0.6 % SOLN Apply 1 drop to eye as needed.    . psyllium (METAMUCIL) 58.6 % powder Take 1 packet by mouth as needed.    [name] Spacer/Aero-Holding Chambers (AEROCHAMBER MV) inhaler Use as instructed 1 each 0  . Syringe/Needle, Disp, (SYRINGE  3CC/20GX1") 20G X 1" 3 ML MISC 1 Syringe by Does not apply route every 30 (thirty) days. For use with b-12 injection monthly 50 each 0  . tretinoin (RETIN-A) 0.01 % gel Apply topically at bedtime. 45 g 3  . urea (CARMOL) 10 % cream Apply topically as needed. 90 g 3  . EPINEPHrine 0.3 mg/0.3 mL IJ SOAJ injection Inject 0.3 mLs (0.3 mg total) into the muscle once. 2 Device 0  . losartan (COZAAR) 50 MG tablet Take 1 tablet (50 mg total) by mouth daily. 90 tablet 3  . benzonatate (TESSALON) 200 MG capsule Take 1 capsule (200 mg total) by mouth 3 (three) times daily as needed for cough. (Patient not taking: Reported on 03/02/2018) 30 capsule 1  . budesonide-formoterol (SYMBICORT) 80-4.5 MCG/ACT inhaler Inhale 2 puffs into the lungs every 12 (twelve) hours. (Patient not taking: Reported on 02/11/2018) 1 Inhaler 0  . fluconazole (DIFLUCAN) 100 MG tablet Take 1 tablet (100 mg total) by mouth daily. (Patient not taking: Reported on 03/02/2018) 1 tablet 0  . nystatin (MYCOSTATIN) 100000 UNIT/ML suspension Take 5 mLs (500,000 Units total) by mouth 4 (four) times daily. (Patient not taking: Reported on 03/02/2018) 100 mL 0   No facility-administered medications prior to visit.     Review of Systems;  Patient denies headache, fevers, malaise, unintentional weight loss, skin rash, eye pain, sinus congestion and sinus pain, sore throat, dysphagia,  hemoptysis , cough, dyspnea, wheezing, chest pain, palpitations, orthopnea, edema, abdominal pain, nausea, melena, diarrhea, constipation, flank pain, dysuria, hematuria, urinary  Frequency, nocturia, numbness, tingling, seizures,  Focal weakness, Loss of consciousness,  Tremor, insomnia, depression, anxiety, and suicidal ideation.      Objective:  BP 134/88 (BP Location: Left Arm, Patient Position: Sitting, Cuff Size: Normal)   Pulse 63   Temp 98.1 F (36.7 C) (Oral)   Resp 15   Ht 5\' 5"  (1.651 m)   Wt 174 lb (78.9 kg)   SpO2 96%   BMI 28.96 kg/m   BP Readings  from Last 3 Encounters:  03/03/18 124/80  03/02/18 134/88  02/11/18 132/88    Wt Readings from Last 3 Encounters:  03/03/18 173 lb 4 oz (78.6 kg)  03/02/18 174 lb (78.9 kg)  02/11/18 175 lb 3.2 oz (79.5 kg)    General appearance: alert, cooperative and appears stated age Tongue: no white patches, ulcerations,  tongue and buccal mucosa are pale and diffusely white . Neck"  no adenopathy, no carotid bruit, supple, symmetrical, trachea midline and thyroid not enlarged, symmetric, no tenderness/mass/nodules Back: symmetric, no curvature. ROM normal. No CVA tenderness. Lungs: clear to auscultation  bilaterally Heart: regular rate and rhythm, S1, S2 normal, no murmur, click, rub or gallop Abdomen: soft, non-tender; bowel sounds normal; no masses,  no organomegaly Pulses: 2+ and symmetric Skin: Skin color, texture, turgor normal. No rashes or lesions Lymph nodes: Cervical, supraclavicular, and axillary nodes normal.  Lab Results  Component Value Date   HGBA1C 5.9 10/12/2015    Lab Results  Component Value Date   CREATININE 0.77 08/28/2017   CREATININE 0.8 09/26/2016   CREATININE 0.86 04/05/2015    Lab Results  Component Value Date   WBC 10.2 05/26/2017   HGB 13.2 05/26/2017   HCT 40.0 05/26/2017   PLT 376.0 05/26/2017   GLUCOSE 77 08/28/2017   CHOL 150 02/24/2017   TRIG 117 02/24/2017   HDL 56 02/24/2017   LDLDIRECT 84.0 04/05/2015   LDLCALC 71 02/24/2017   ALT 33 08/28/2017   AST 22 08/28/2017   NA 138 08/28/2017   K 4.2 08/28/2017   CL 102 08/28/2017   CREATININE 0.77 08/28/2017   BUN 9 08/28/2017   CO2 27 08/28/2017   TSH 1.80 02/24/2017   HGBA1C 5.9 10/12/2015    Dg Chest 2 View  Result Date: 01/08/2018 CLINICAL DATA:  Cough, shortness of breath. EXAM: CHEST - 2 VIEW COMPARISON:  Radiographs of October 30, 2015. FINDINGS: The heart size and mediastinal contours are within normal limits. Both lungs are clear. No pneumothorax or pleural effusion is noted. The  visualized skeletal structures are unremarkable. IMPRESSION: No active cardiopulmonary disease. Electronically Signed   By: Marijo Conception, M.D.   On: 01/08/2018 17:22    Assessment & Plan:   Problem List Items Addressed This Visit    Oral candidiasis    She appears to have a resolved infection but remains symptomatic. Since she may have esophageal thrush ,  Will treat for a full seven days with nystatin oral suspension and have her swallow the medication as well.       Relevant Medications   nystatin (MYCOSTATIN) 100000 UNIT/ML suspension      I have discontinued Joaquim Lai A. Camplin's benzonatate, budesonide-formoterol, and fluconazole. I have also changed her nystatin. Additionally, I am having her maintain her aspirin, Propylene Glycol, psyllium, Docusate Calcium (STOOL SOFTENER PO), fexofenadine, cholecalciferol, famotidine, Vitamin-B Complex, diazepam, atorvastatin, conjugated estrogens, cyanocobalamin, cycloSPORINE, Levothyroxine Sodium, meclizine, SYRINGE 3CC/20GX1", ALPRAZolam, promethazine, metoprolol succinate, ondansetron, AEROCHAMBER MV, estradiol, tretinoin, urea, diclofenac sodium, omega-3 acid ethyl esters, montelukast, omeprazole, Azelastine-Fluticasone, albuterol, and beclomethasone.  Meds ordered this encounter  Medications  . nystatin (MYCOSTATIN) 100000 UNIT/ML suspension    Sig: Take 5 mLs (500,000 Units total) by mouth 4 (four) times daily. And swallow    Dispense:  140 mL    Refill:  0    Medications Discontinued During This Encounter  Medication Reason  . benzonatate (TESSALON) 200 MG capsule Completed Course  . budesonide-formoterol (SYMBICORT) 80-4.5 MCG/ACT inhaler Patient has not taken in last 30 days  . fluconazole (DIFLUCAN) 100 MG tablet   . nystatin (MYCOSTATIN) 100000 UNIT/ML suspension Completed Course    Follow-up: No follow-ups on file.   Crecencio Mc, MD

## 2018-03-02 NOTE — Telephone Encounter (Signed)
She needs to be seen.. I can work her in this afternoon

## 2018-03-02 NOTE — Telephone Encounter (Signed)
Please advise 

## 2018-03-03 ENCOUNTER — Ambulatory Visit (INDEPENDENT_AMBULATORY_CARE_PROVIDER_SITE_OTHER): Payer: Medicare Other | Admitting: Family

## 2018-03-03 ENCOUNTER — Encounter: Payer: Self-pay | Admitting: Family

## 2018-03-03 VITALS — BP 124/80 | HR 77 | Temp 98.5°F | Resp 16 | Wt 173.2 lb

## 2018-03-03 DIAGNOSIS — L500 Allergic urticaria: Secondary | ICD-10-CM | POA: Diagnosis not present

## 2018-03-03 DIAGNOSIS — R42 Dizziness and giddiness: Secondary | ICD-10-CM

## 2018-03-03 DIAGNOSIS — I10 Essential (primary) hypertension: Secondary | ICD-10-CM | POA: Diagnosis not present

## 2018-03-03 DIAGNOSIS — B370 Candidal stomatitis: Secondary | ICD-10-CM | POA: Diagnosis not present

## 2018-03-03 MED ORDER — EPINEPHRINE 0.3 MG/0.3ML IJ SOAJ: 0.3000 mg | Freq: Once | INTRAMUSCULAR | 0 refills | Status: DC

## 2018-03-03 MED ORDER — AMLODIPINE BESYLATE 5 MG PO TABS: 5.0000 mg | ORAL_TABLET | Freq: Every day | ORAL | 3 refills | Status: DC

## 2018-03-03 NOTE — Assessment & Plan Note (Addendum)
Blood pressure and HR on the lower end. She was dizzy after laying down and than sat up on exam table .  Not orthostatic based on blood pressure/HR readings ( see flow sheet). I do suspect dehydration playing a role. Reassured by normal neurologic exam. No syncope. Not lightheaded today.  Discontinued losartan ( her preference due to recall) and have started amlodipine. May consider decreasing metoprolol at follow up. She declines MRI brain for lightheadedness. Patient will return in one week for follow up. Close vigilance.

## 2018-03-03 NOTE — Assessment & Plan Note (Addendum)
Chronic. Episode from last night resolved today. Unchanged per patient. Declines MRI brain, referral to ENT. Will follow.

## 2018-03-03 NOTE — Patient Instructions (Addendum)
Suspect dehydration playing a role although you do not appear orthostatic based on vitals.  PLENTY OF WATER.   STOP losartan.Start amlodipine.  Follow up one week with blood pressure and HR readings.  BP goal < 130/80.    Dizziness Dizziness is a common problem. It is a feeling of unsteadiness or light-headedness. You may feel like you are about to faint. Dizziness can lead to injury if you stumble or fall. Anyone can become dizzy, but dizziness is more common in older adults. This condition can be caused by a number of things, including medicines, dehydration, or illness. Follow these instructions at home: Eating and drinking  Drink enough fluid to keep your urine clear or pale yellow. This helps to keep you from becoming dehydrated. Try to drink more clear fluids, such as water.  Do not drink alcohol.  Limit your caffeine intake if told to do so by your health care provider. Check ingredients and nutrition facts to see if a food or beverage contains caffeine.  Limit your salt (sodium) intake if told to do so by your health care provider. Check ingredients and nutrition facts to see if a food or beverage contains sodium. Activity  Avoid making quick movements. ? Rise slowly from chairs and steady yourself until you feel okay. ? In the morning, first sit up on the side of the bed. When you feel okay, stand slowly while you hold onto something until you know that your balance is fine.  If you need to stand in one place for a long time, move your legs often. Tighten and relax the muscles in your legs while you are standing.  Do not drive or use heavy machinery if you feel dizzy.  Avoid bending down if you feel dizzy. Place items in your home so that they are easy for you to reach without leaning over. Lifestyle  Do not use any products that contain nicotine or tobacco, such as cigarettes and e-cigarettes. If you need help quitting, ask your health care provider.  Try to reduce your  stress level by using methods such as yoga or meditation. Talk with your health care provider if you need help to manage your stress. General instructions  Watch your dizziness for any changes.  Take over-the-counter and prescription medicines only as told by your health care provider. Talk with your health care provider if you think that your dizziness is caused by a medicine that you are taking.  Tell a friend or a family member that you are feeling dizzy. If he or she notices any changes in your behavior, have this person call your health care provider.  Keep all follow-up visits as told by your health care provider. This is important. Contact a health care provider if:  Your dizziness does not go away.  Your dizziness or light-headedness gets worse.  You feel nauseous.  You have reduced hearing.  You have new symptoms.  You are unsteady on your feet or you feel like the room is spinning. Get help right away if:  You vomit or have diarrhea and are unable to eat or drink anything.  You have problems talking, walking, swallowing, or using your arms, hands, or legs.  You feel generally weak.  You are not thinking clearly or you have trouble forming sentences. It may take a friend or family member to notice this.  You have chest pain, abdominal pain, shortness of breath, or sweating.  Your vision changes.  You have any bleeding.  You have  a severe headache.  You have neck pain or a stiff neck.  You have a fever. These symptoms may represent a serious problem that is an emergency. Do not wait to see if the symptoms will go away. Get medical help right away. Call your local emergency services (911 in the [place] Do not drive yourself to the hospital. Summary  Dizziness is a feeling of unsteadiness or light-headedness. This condition can be caused by a number of things, including medicines, dehydration, or illness.  Anyone can become dizzy, but dizziness is more common in  older adults.  Drink enough fluid to keep your urine clear or pale yellow. Do not drink alcohol.  Avoid making quick movements if you feel dizzy. Monitor your dizziness for any changes. This information is not intended to replace advice given to you by your health care provider. Make sure you discuss any questions you have with your health care provider. Document Released: 04/08/2001 Document Revised: 11/15/2016 Document Reviewed: 11/15/2016 Elsevier Interactive Patient Education  [employer].

## 2018-03-03 NOTE — Progress Notes (Signed)
Subjective:    Patient ID: Angela Mendoza, female    DOB: 04/04/1951, 67 y.o.   MRN: [id number]  CC: Angela Mendoza is a 67 y.o. female who presents today for an acute visit.    HPI: CC: lightheadedness, 10 days, waxed and waned.   Describes two episodes of lightheadedness for last week. 'not dizzy.' Not syncope. NO vertigo.NOt triggered when changing positions from sitting to standing.  First episode had been standing in the kitchen cooking.4 episodes, has been standing when they have all occurred. Last episode occurred 5 days ago when standing at yard sale outside. Not drinking a lot of water. One cup coffee per cough.  Resolves when lays back in her recliner. Food doesn't really help.  Feels like tunnel vision.   H/o vertigo- told her meniere's.  Reports left tinnitus and ear pressure. Episodes of vertigo approx 2-3 times per year. last night did have episode of vertigo with spinning room. Took valium and meclizine with resolve. Notes congestion past 2 weeks. On allegra. No fever.    She notes that for 3 months she has not eaten well, reports a viral illness.  Seen yesterday by pcp for thrush and on nystatin. Improving.   HTN- compliant with medication. On losartan and metoprolol. Years ago taken off of 'water pill' as became very lightheaded.  Denies exertional chest pain or pressure, numbness or tingling radiating to left arm or jaw, palpitations,frequent headaches, changes in vision, or shortness of breath.       HISTORY:  Past Medical History:  Diagnosis Date  . ABNORMAL HEART RHYTHMS 06/09/2008  . Allergic rhinitis 06/09/2008   chronic  . Asthma, intrinsic 10/06/2011   controlled on qvar. [name] 09/2011:  Very mild airflow obstruction (FEV1% 68, FVL c/w obstruction)   . Chronic headache 06/09/2008  . COPD (chronic obstructive pulmonary disease) ([place])   . Depression   . Diverticulosis 2013   h/o diverticulitis  . Dry eyes   . GERD (gastroesophageal reflux disease)   .  History of breast implant removal    silicone mastitis - R axilla silicone LN, free silicone L breast upper outer quadrant  . Hot flashes   . HYPERLIPIDEMIA 06/09/2008  . HYPERTENSION 06/09/2008  . Hypothyroidism   . MVP (mitral valve prolapse)   . OSA on CPAP    [name]  . Pernicious anemia    per prior pcp  . Rosacea   . Surgical menopause 1991  . Vertigo   . Vitamin D deficiency    Past Surgical History:  Procedure Laterality Date  . APPENDECTOMY  2007  . BREAST BIOPSY Left 09/27/2009  . BREAST ENHANCEMENT SURGERY  2006  . CARDIOVASCULAR STRESS TEST  2013   [place] [name] - WNL, EF 55-60%, with 0 calcium score  . CHOLECYSTECTOMY  2007   biliary dyskinesia  . COLONOSCOPY  08/2012   diverticulosis  . COLONOSCOPY  2006   inflamed hyperplastic polyp  . ESOPHAGOGASTRODUODENOSCOPY  08/2012   esophageal reflux  . NASAL SINUS SURGERY    . RECTOCELE REPAIR  12/2008   [name]  . sleep study  08/2007   OSA, 12 cm H2O,  . TOTAL ABDOMINAL HYSTERECTOMY  1991   heavy bleeding, ovaries removed  . TUBAL LIGATION    . US ECHOCARDIOGRAPHY  09/2009   impaired relaxation, mild tric regurg, normal MV, EF 60%  . US ECHOCARDIOGRAPHY  11/2007   mild-mod MR   Family History  Problem Relation Age of Onset  .  Cancer Mother 81       lung, passive smoke  . Cancer Father 8       lung, smoker  . Cancer Paternal Aunt        breast, lung  . Cancer Cousin        breast  . Stroke Sister        TIA  . Hypertension Sister   . Heart failure Sister   . Alzheimer's disease Paternal Aunt   . Aortic dissection Paternal Uncle   . Aortic dissection Maternal Aunt   . Breast cancer Maternal Aunt     Allergies: Codeine; Sulfonamide derivatives; and Estradiol Current Outpatient Medications on File Prior to Visit  Medication Sig Dispense Refill  . albuterol (PROVENTIL HFA) 108 (90 Base) MCG/ACT inhaler Inhale 2 puffs into the lungs every 6 (six) hours as needed. 3 Inhaler 3  . ALPRAZolam  (XANAX) 0.25 MG tablet TAKE 1 TABLET (0.25 MG TOTAL) BY MOUTH ONCE DAILY AS NEEDED FOR SLEEP. 30 tablet 2  . aspirin 81 MG tablet Take 81 mg by mouth daily.      [name] atorvastatin (LIPITOR) 40 MG tablet Take 1 tablet (40 mg total) by mouth daily. 90 tablet 3  . Azelastine-Fluticasone (DYMISTA) 137-50 MCG/ACT SUSP Place 1 spray into the nose 2 (two) times daily. 3 Bottle 3  . B Complex Vitamins (VITAMIN-B COMPLEX) TABS Take 1 tablet by mouth daily.    . beclomethasone (QVAR REDIHALER) 80 MCG/ACT inhaler Inhale 2 puffs into the lungs 2 (two) times daily. 3 Inhaler 3  . cholecalciferol (VITAMIN D) 1000 units tablet Take 1,000 Units by mouth daily.     [name] conjugated estrogens (PREMARIN) vaginal cream Place 1 Applicatorful vaginally 2 (two) times a week. 90 day supply,  Refill for one year 90 g 3  . cyanocobalamin (,VITAMIN B-12,) 1000 MCG/ML injection Inject 1 mL (1,000 mcg total) into the muscle every 30 (thirty) days. 3 mL 3  . cycloSPORINE (RESTASIS) 0.05 % ophthalmic emulsion Place 1 drop into both eyes 2 (two) times daily. 3 each 3  . diazepam (VALIUM) 5 MG tablet TAKE 1 TABLET BY MOUTH EVERY 6 HOURS AS NEEDED FOR VERTIGO 30 tablet 1  . diclofenac sodium (VOLTAREN) 1 % GEL Apply 2 g topically 4 (four) times daily. 100 g 3  . Docusate Calcium (STOOL SOFTENER PO) Take 1 tablet by mouth as needed.    [name] estradiol (VIVELLE-DOT) 0.1 MG/24HR patch Place 1 patch (0.1 mg total) onto the skin 2 (two) times a week. 24 patch 3  . famotidine (PEPCID) 20 MG tablet Take 20 mg by mouth daily as needed for heartburn or indigestion.    . fexofenadine (ALLEGRA ALLERGY) 180 MG tablet Take 1 tablet (180 mg total) by mouth daily. 90 tablet 3  . Levothyroxine Sodium 50 MCG CAPS Take 1 capsule (50 mcg total) by mouth daily. 90 capsule 3  . meclizine (ANTIVERT) 25 MG tablet Take 1 tablet (25 mg total) by mouth 3 (three) times daily as needed. 270 tablet 1  . metoprolol succinate (TOPROL-XL) 25 MG 24 hr tablet Take 3 tablets  (75 mg total) by mouth daily. 270 tablet 3  . montelukast (SINGULAIR) 10 MG tablet Take 1 tablet (10 mg total) by mouth at bedtime. 90 tablet 3  . nystatin (MYCOSTATIN) 100000 UNIT/ML suspension Take 5 mLs (500,000 Units total) by mouth 4 (four) times daily. And swallow 140 mL 0  . omega-3 acid ethyl esters (LOVAZA) 1 g capsule Take 1 capsule (  1 g total) by mouth daily. 90 capsule 3  . omeprazole (PRILOSEC) 40 MG capsule Take 40 mg by mouth daily.    . ondansetron (ZOFRAN ODT) 4 MG disintegrating tablet Take 1 tablet (4 mg total) by mouth every 8 (eight) hours as needed for nausea or vomiting. 20 tablet 0  . promethazine (PHENERGAN) 25 MG tablet Take 1 tablet (25 mg total) by mouth every 6 (six) hours as needed for nausea or vomiting. 90 tablet 3  . Propylene Glycol (SYSTANE BALANCE) 0.6 % SOLN Apply 1 drop to eye as needed.    . psyllium (METAMUCIL) 58.6 % powder Take 1 packet by mouth as needed.    [name] Spacer/Aero-Holding Chambers (AEROCHAMBER MV) inhaler Use as instructed 1 each 0  . Syringe/Needle, Disp, (SYRINGE 3CC/20GX1") 20G X 1" 3 ML MISC 1 Syringe by Does not apply route every 30 (thirty) days. For use with b-12 injection monthly 50 each 0  . tretinoin (RETIN-A) 0.01 % gel Apply topically at bedtime. 45 g 3  . urea (CARMOL) 10 % cream Apply topically as needed. 90 g 3   No current facility-administered medications on file prior to visit.     Social History   Tobacco Use  . Smoking status: Former Smoker    Packs/day: 0.30    Years: 7.00    Pack years: 2.10    Types: Cigarettes    Last attempt to quit: 10/27/1973    Years since quitting: 44.3  . Smokeless tobacco: Never Used  . 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 Hx of children who are grown  Substance Use Topics  . Alcohol use: Yes    Alcohol/week: 0.0 oz    Comment: very rare  . Drug use: No    Review of Systems  Constitutional: Negative for chills and fever.  HENT: Positive for  congestion.   Eyes: Negative for visual disturbance.  Respiratory: Negative for cough.   Cardiovascular: Negative for chest pain and palpitations.  Gastrointestinal: Negative for nausea and vomiting.  Neurological: Positive for dizziness and light-headedness. Negative for weakness, numbness and headaches.      Objective:    BP 124/80 (BP Location: Left Arm, Patient Position: Standing, Cuff Size: Large)   Pulse 77   Temp 98.5 F (36.9 C) (Oral)   Resp 16   Wt 173 lb 4 oz (78.6 kg)   SpO2 98%   BMI 28.83 kg/m   BP Readings from Last 3 Encounters:  03/03/18 124/80  03/02/18 134/88  02/11/18 132/88   Wt Readings from Last 3 Encounters:  03/03/18 173 lb 4 oz (78.6 kg)  03/02/18 174 lb (78.9 kg)  02/11/18 175 lb 3.2 oz (79.5 kg)    Physical Exam  Constitutional: She appears well-developed and well-nourished.  HENT:  Head: Normocephalic and atraumatic.  Right Ear: Hearing, tympanic membrane, external ear and ear canal normal. No swelling or tenderness. Tympanic membrane is not erythematous and not bulging. No middle ear effusion.  Left Ear: Tympanic membrane, external ear and ear canal normal. No swelling or tenderness. Tympanic membrane is not erythematous and not bulging.  No middle ear effusion.  Nose: Nose normal. No rhinorrhea. Right sinus exhibits no maxillary sinus tenderness and no frontal sinus tenderness. Left sinus exhibits no maxillary sinus tenderness and no frontal sinus tenderness.  Mouth/Throat: Uvula is midline, oropharynx is clear and moist and mucous membranes are normal. No posterior oropharyngeal edema or posterior oropharyngeal erythema.  No oral lesions, exudate appreciated.  Eyes: Pupils are equal, round, and reactive to light. Conjunctivae, EOM and lids are normal. Lids are everted and swept, no foreign bodies found.  Normal fundus bilaterally   Cardiovascular: Normal rate, regular rhythm, normal heart sounds and normal pulses.  Pulmonary/Chest: Effort  normal and breath sounds normal. She has no wheezes. She has no rhonchi. She has no rales.  Lymphadenopathy:       Head (right side): No submental, no submandibular, no tonsillar, no preauricular, no posterior auricular and no occipital adenopathy present.       Head (left side): No submental, no submandibular, no tonsillar, no preauricular, no posterior auricular and no occipital adenopathy present.    She has no cervical adenopathy.       Right cervical: No superficial cervical, no deep cervical and no posterior cervical adenopathy present.      Left cervical: No superficial cervical, no deep cervical and no posterior cervical adenopathy present.  Neurological: She is alert. She has normal strength. No cranial nerve deficit or sensory deficit. She displays a negative Romberg sign.  Reflex Scores:      Bicep reflexes are 2+ on the right side and 2+ on the left side.      Patellar reflexes are 2+ on the right side and 2+ on the left side. Grip equal and strong bilateral upper extremities. Gait strong and steady. Able to perform  finger-to-nose without difficulty.  Dix hall pike maneuver did not elicited vertigo. No nystagmus noted.    Skin: Skin is warm and dry.  Psychiatric: She has a normal mood and affect. Her speech is normal and behavior is normal. Thought content normal.  Vitals reviewed.      Assessment & Plan:   Problem List Items Addressed This Visit      Cardiovascular and Mediastinum   Essential hypertension, benign - Primary    Blood pressure and HR on the lower end. She was dizzy after laying down and than sat up on exam table .  Not orthostatic based on blood pressure/HR readings ( see flow sheet). I do suspect dehydration playing a role. Reassured by normal neurologic exam. No syncope. Not lightheaded today.  Discontinued losartan ( her preference due to recall) and have started amlodipine. May consider decreasing metoprolol at follow up. She declines MRI brain for  lightheadedness. Patient will return in one week for follow up. Close vigilance.         Relevant Medications   amLODipine (NORVASC) 5 MG tablet     Digestive   Oral candidiasis    Improving. On nystatin.         Musculoskeletal and Integument   Allergic urticaria     Other   Vertigo    Chronic. Episode from last night resolved today. Unchanged per patient. Declines MRI brain, referral to ENT. Will follow.            I have discontinued Joaquim Lai A. Walpole's losartan. I am also having her start on amLODipine. Additionally, I am having her maintain her aspirin, Propylene Glycol, psyllium, Docusate Calcium (STOOL SOFTENER PO), fexofenadine, cholecalciferol, famotidine, Vitamin-B Complex, diazepam, atorvastatin, conjugated estrogens, cyanocobalamin, cycloSPORINE, Levothyroxine Sodium, meclizine, SYRINGE 3CC/20GX1", ALPRAZolam, promethazine, metoprolol succinate, ondansetron, AEROCHAMBER MV, estradiol, tretinoin, urea, diclofenac sodium, omega-3 acid ethyl esters, montelukast, omeprazole, Azelastine-Fluticasone, albuterol, beclomethasone, and nystatin.   Meds ordered this encounter  Medications  . EPINEPHrine 0.3 mg/0.3 mL IJ SOAJ injection    Sig: Inject 0.3 mLs (0.3 mg total) into the muscle once for 1 dose.  Dispense:  2 Device    Refill:  0  . amLODipine (NORVASC) 5 MG tablet    Sig: Take 1 tablet (5 mg total) by mouth daily.    Dispense:  90 tablet    Refill:  3    Order Specific Question:   Supervising Provider    Answer:   Crecencio Mc [[id number]]    Return precautions given.   Risks, benefits, and alternatives of the medications and treatment plan prescribed today were discussed, and patient expressed understanding.   Education regarding symptom management and diagnosis given to patient on AVS.  Continue to follow with Crecencio Mc, MD for routine health maintenance.   Angela Mendoza and I agreed with plan.   [name], FNP

## 2018-03-03 NOTE — Assessment & Plan Note (Signed)
Improving. On nystatin.

## 2018-03-04 ENCOUNTER — Telehealth: Payer: Self-pay | Admitting: Internal Medicine

## 2018-03-04 NOTE — Assessment & Plan Note (Signed)
She appears to have a resolved infection but remains symptomatic. Since she may have esophageal thrush ,  Will treat for a full seven days with nystatin oral suspension and have her swallow the medication as well.

## 2018-03-04 NOTE — Telephone Encounter (Signed)
Copied from [place] [phone]. Topic: Quick Communication - Rx Refill/Question >> Mar 04, 2018 11:53 AM Cleaster Corin, NT wrote: Medication:EPINEPHrine 0.3 mg/0.3 mL IJ SOAJ injection [[id number]]  Has the patient contacted their pharmacy? yes (Agent: If no, request that the patient contact the pharmacy for the refill.) Preferred Pharmacy (with phone number or street name): [facility] [place] [address] Phone: [phone] Fax: [phone]  Pt. Is aware of rx. Being at [facility] but requesting for it to be sent through mail order  Agent: Please be advised that RX refills may take up to 3 business days. We ask that you follow-up with your pharmacy.

## 2018-03-12 ENCOUNTER — Ambulatory Visit (INDEPENDENT_AMBULATORY_CARE_PROVIDER_SITE_OTHER): Payer: Medicare Other | Admitting: Family

## 2018-03-12 ENCOUNTER — Encounter: Payer: Self-pay | Admitting: Family

## 2018-03-12 VITALS — BP 122/72 | HR 65 | Temp 98.5°F | Resp 18 | Wt 176.6 lb

## 2018-03-12 DIAGNOSIS — K2100 Gastro-esophageal reflux disease with esophagitis, without bleeding: Secondary | ICD-10-CM

## 2018-03-12 DIAGNOSIS — L500 Allergic urticaria: Secondary | ICD-10-CM | POA: Diagnosis not present

## 2018-03-12 DIAGNOSIS — I10 Essential (primary) hypertension: Secondary | ICD-10-CM | POA: Diagnosis not present

## 2018-03-12 MED ORDER — AMLODIPINE BESYLATE 5 MG PO TABS: 5.0000 mg | ORAL_TABLET | Freq: Every day | ORAL | 3 refills | Status: DC

## 2018-03-12 MED ORDER — EPINEPHRINE 0.3 MG/0.3ML IJ SOAJ: 0.3000 mg | Freq: Once | INTRAMUSCULAR | 1 refills | Status: AC

## 2018-03-12 MED ORDER — FAMOTIDINE 20 MG PO TABS: 20.0000 mg | ORAL_TABLET | Freq: Every day | ORAL | 1 refills | Status: DC | PRN

## 2018-03-12 NOTE — Assessment & Plan Note (Addendum)
Discussed long-term use of PPIs.  Education provided and patient was very willing to try H2b as pcp had discussed in past.  She will continue follow-up PCP

## 2018-03-12 NOTE — Patient Instructions (Addendum)
Trial of metoprolol 50mg  to see if fatigue improves.   Today we discussed referrals, orders. CARDIOLOGY   I have placed these orders in the system for you.  Please be sure to give [place] a call if you have not heard from our office regarding scheduling a test or regarding referral in a timely manner.  It is very important that you let me know as soon as possible.     Monitor blood pressure,  Goal is less than 130/80; if persistently higher, please make sooner follow up appointment so we can recheck you blood pressure and manage medications.  Long term use beyond 3 months of proton pump inhibitors , also called PPI's, is associated with malabsorption of vitamins, chronic kidney disease, fracture risk, and diarrheal illnesses. PPI's include Nexium, Prilosec, Protonix, Dexilant, and Prevacid.   I generally recommend trying to control acid reflux with lifestyle modifications including avoiding trigger foods, not eating 2 hours prior to bedtime. You may use histamine 2 blockers daily to twice daily ( this is Zantac, Pepcid) and then when symptoms flare, start back on PPI for short course.   Of note, we will need to do an endoscopy ( upper GI) to evaluate your esophagus, stomach in the future if acid reflux persists are you develop red flag symptoms: trouble swallowing, hoarseness, chronic cough, unexplained weight loss.

## 2018-03-12 NOTE — Assessment & Plan Note (Signed)
Refilled EpiPen as requested by patient today.

## 2018-03-12 NOTE — Assessment & Plan Note (Addendum)
Improved. Dizziness resolved.   Patient would like to decrease metoprolol from 75 mg to 50 mg ( had been on 50mg  in the past) and see if this helps the fatigue.  I think this is reasonable as long as she is very vigilant regarding blood pressure, heart rate which we have discussed today.  She is had a little bit of swelling on amlodipine however it is been slight.  She will continue to monitor and certainly if worsens or becomes more bothersome, she will let me know.  Patient declines MRI Brain at this time. No cp.  Based on family history, personal history of hypertension, patient and I agreed consult cardiology for risk stratification is appropriate.  Referral has been placed.

## 2018-03-12 NOTE — Progress Notes (Signed)
Subjective:    Patient ID: Angela Mendoza, female    DOB: 11/21/1950, 67 y.o.   MRN: [id number]  CC: [name] is a 67 y.o. female who presents today for follow up.   HPI: HTN- doing well on amlodipine, has noted a little ankle swelling, twice since starting medication. NOtes had done a lot of walking on those days.  Started on metoprolol for palpitaitons  At home 106/59, HR 77  Compliant with cipap. No NSAIDs.   No dizziness, lightheadedness.   GERD- She is concerned for prilosec and not sure what to take.  no hoarseness, trouble swallowing. Had EGD 7 years ago per patient normal.         HISTORY:  Past Medical History:  Diagnosis Date  . ABNORMAL HEART RHYTHMS 06/09/2008  . Allergic rhinitis 06/09/2008   chronic  . Asthma, intrinsic 10/06/2011   controlled on qvar. [name] 09/2011:  Very mild airflow obstruction (FEV1% 68, FVL c/w obstruction)   . Chronic headache 06/09/2008  . COPD (chronic obstructive pulmonary disease) ([place])   . Depression   . Diverticulosis 2013   h/o diverticulitis  . Dry eyes   . GERD (gastroesophageal reflux disease)   . History of breast implant removal    silicone mastitis - R axilla silicone LN, free silicone L breast upper outer quadrant  . Hot flashes   . HYPERLIPIDEMIA 06/09/2008  . HYPERTENSION 06/09/2008  . Hypothyroidism   . MVP (mitral valve prolapse)   . OSA on CPAP    [name]  . Pernicious anemia    per prior pcp  . Rosacea   . Surgical menopause 1991  . Vertigo   . Vitamin D deficiency    Past Surgical History:  Procedure Laterality Date  . APPENDECTOMY  2007  . BREAST BIOPSY Left 09/27/2009  . BREAST ENHANCEMENT SURGERY  2006  . CARDIOVASCULAR STRESS TEST  2013   [place] [name] - WNL, EF 55-60%, with 0 calcium score  . CHOLECYSTECTOMY  2007   biliary dyskinesia  . COLONOSCOPY  08/2012   diverticulosis  . COLONOSCOPY  2006   inflamed hyperplastic polyp  . ESOPHAGOGASTRODUODENOSCOPY  08/2012   esophageal  reflux  . NASAL SINUS SURGERY    . RECTOCELE REPAIR  12/2008   [name]  . sleep study  08/2007   OSA, 12 cm H2O,  . TOTAL ABDOMINAL HYSTERECTOMY  1991   heavy bleeding, ovaries removed  . TUBAL LIGATION    . US ECHOCARDIOGRAPHY  09/2009   impaired relaxation, mild tric regurg, normal MV, EF 60%  . US ECHOCARDIOGRAPHY  11/2007   mild-mod MR   Family History  Problem Relation Age of Onset  . Cancer Mother 58       lung, passive smoke  . Cancer Father 35       lung, smoker  . Cancer Paternal Aunt        breast, lung  . Cancer Cousin        breast  . Stroke Sister        TIA  . Hypertension Sister   . Heart failure Sister   . Alzheimer's disease Paternal Aunt   . Aortic dissection Paternal Uncle   . Aortic dissection Maternal Aunt   . Breast cancer Maternal Aunt     Allergies: Codeine; Sulfonamide derivatives; and Estradiol Current Outpatient Medications on File Prior to Visit  Medication Sig Dispense Refill  . albuterol (PROVENTIL HFA) 108 (90 Base) MCG/ACT inhaler Inhale 2  puffs into the lungs every 6 (six) hours as needed. 3 Inhaler 3  . ALPRAZolam (XANAX) 0.25 MG tablet TAKE 1 TABLET (0.25 MG TOTAL) BY MOUTH ONCE DAILY AS NEEDED FOR SLEEP. 30 tablet 2  . aspirin 81 MG tablet Take 81 mg by mouth daily.      [name] atorvastatin (LIPITOR) 40 MG tablet Take 1 tablet (40 mg total) by mouth daily. 90 tablet 3  . Azelastine-Fluticasone (DYMISTA) 137-50 MCG/ACT SUSP Place 1 spray into the nose 2 (two) times daily. 3 Bottle 3  . B Complex Vitamins (VITAMIN-B COMPLEX) TABS Take 1 tablet by mouth daily.    . beclomethasone (QVAR REDIHALER) 80 MCG/ACT inhaler Inhale 2 puffs into the lungs 2 (two) times daily. 3 Inhaler 3  . cholecalciferol (VITAMIN D) 1000 units tablet Take 1,000 Units by mouth daily.     [name] conjugated estrogens (PREMARIN) vaginal cream Place 1 Applicatorful vaginally 2 (two) times a week. 90 day supply,  Refill for one year 90 g 3  . cyanocobalamin (,VITAMIN B-12,) 1000  MCG/ML injection Inject 1 mL (1,000 mcg total) into the muscle every 30 (thirty) days. 3 mL 3  . cycloSPORINE (RESTASIS) 0.05 % ophthalmic emulsion Place 1 drop into both eyes 2 (two) times daily. 3 each 3  . diazepam (VALIUM) 5 MG tablet TAKE 1 TABLET BY MOUTH EVERY 6 HOURS AS NEEDED FOR VERTIGO 30 tablet 1  . diclofenac sodium (VOLTAREN) 1 % GEL Apply 2 g topically 4 (four) times daily. 100 g 3  . Docusate Calcium (STOOL SOFTENER PO) Take 1 tablet by mouth as needed.    [name] estradiol (VIVELLE-DOT) 0.1 MG/24HR patch Place 1 patch (0.1 mg total) onto the skin 2 (two) times a week. 24 patch 3  . fexofenadine (ALLEGRA ALLERGY) 180 MG tablet Take 1 tablet (180 mg total) by mouth daily. 90 tablet 3  . Levothyroxine Sodium 50 MCG CAPS Take 1 capsule (50 mcg total) by mouth daily. 90 capsule 3  . meclizine (ANTIVERT) 25 MG tablet Take 1 tablet (25 mg total) by mouth 3 (three) times daily as needed. 270 tablet 1  . metoprolol succinate (TOPROL-XL) 25 MG 24 hr tablet Take 3 tablets (75 mg total) by mouth daily. 270 tablet 3  . montelukast (SINGULAIR) 10 MG tablet Take 1 tablet (10 mg total) by mouth at bedtime. 90 tablet 3  . omega-3 acid ethyl esters (LOVAZA) 1 g capsule Take 1 capsule (1 g total) by mouth daily. 90 capsule 3  . omeprazole (PRILOSEC) 40 MG capsule Take 40 mg by mouth daily.    . ondansetron (ZOFRAN ODT) 4 MG disintegrating tablet Take 1 tablet (4 mg total) by mouth every 8 (eight) hours as needed for nausea or vomiting. 20 tablet 0  . promethazine (PHENERGAN) 25 MG tablet Take 1 tablet (25 mg total) by mouth every 6 (six) hours as needed for nausea or vomiting. 90 tablet 3  . Propylene Glycol (SYSTANE BALANCE) 0.6 % SOLN Apply 1 drop to eye as needed.    . psyllium (METAMUCIL) 58.6 % powder Take 1 packet by mouth as needed.    [name] Spacer/Aero-Holding Chambers (AEROCHAMBER MV) inhaler Use as instructed 1 each 0  . Syringe/Needle, Disp, (SYRINGE 3CC/20GX1") 20G X 1" 3 ML MISC 1 Syringe by Does  not apply route every 30 (thirty) days. For use with b-12 injection monthly 50 each 0  . tretinoin (RETIN-A) 0.01 % gel Apply topically at bedtime. 45 g 3  . urea (CARMOL) 10 %  cream Apply topically as needed. 90 g 3   No current facility-administered medications on file prior to visit.     Social History   Tobacco Use  . Smoking status: Former Smoker    Packs/day: 0.30    Years: 7.00    Pack years: 2.10    Types: Cigarettes    Last attempt to quit: 10/27/1973    Years since quitting: 44.4  . Smokeless tobacco: Never Used  . 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 Hx of children who are grown  Substance Use Topics  . Alcohol use: Yes    Alcohol/week: 0.0 oz    Comment: very rare  . Drug use: No    Review of Systems  Constitutional: Negative for chills and fever.  HENT: Negative for trouble swallowing and voice change.   Respiratory: Negative for cough.   Cardiovascular: Positive for leg swelling (trace). Negative for chest pain and palpitations.  Gastrointestinal: Negative for nausea and vomiting.  Neurological: Negative for dizziness and headaches.      Objective:    BP 122/72 (BP Location: Right Arm, Patient Position: Sitting, Cuff Size: Large)   Pulse 65   Temp 98.5 F (36.9 C) (Oral)   Resp 18   Wt 176 lb 9.6 oz (80.1 kg)   SpO2 97%   BMI 29.39 kg/m  BP Readings from Last 3 Encounters:  03/12/18 122/72  03/03/18 124/80  03/02/18 134/88   Wt Readings from Last 3 Encounters:  03/12/18 176 lb 9.6 oz (80.1 kg)  03/03/18 173 lb 4 oz (78.6 kg)  03/02/18 174 lb (78.9 kg)    Physical Exam  Constitutional: She appears well-developed and well-nourished.  Eyes: Conjunctivae are normal.  Cardiovascular: Normal rate, regular rhythm, normal heart sounds and normal pulses.  No LE edema.   Pulmonary/Chest: Effort normal and breath sounds normal. She has no wheezes. She has no rhonchi. She has no rales.  Neurological: She is alert.    Skin: Skin is warm and dry.  Psychiatric: She has a normal mood and affect. Her speech is normal and behavior is normal. Thought content normal.  Vitals reviewed.      Assessment & Plan:   Problem List Items Addressed This Visit      Cardiovascular and Mediastinum   Essential hypertension, benign - Primary    Improved. Dizziness resolved.   Patient would like to decrease metoprolol from 75 mg to 50 mg ( had been on 50mg  in the past) and see if this helps the fatigue.  I think this is reasonable as long as she is very vigilant regarding blood pressure, heart rate which we have discussed today.  She is had a little bit of swelling on amlodipine however it is been slight.  She will continue to monitor and certainly if worsens or becomes more bothersome, she will let me know.  Patient declines MRI Brain at this time. No cp.  Based on family history, personal history of hypertension, patient and I agreed consult cardiology for risk stratification is appropriate.  Referral has been placed.      Relevant Medications   EPINEPHrine 0.3 mg/0.3 mL IJ SOAJ injection   amLODipine (NORVASC) 5 MG tablet   Other Relevant Orders   Ambulatory referral to Cardiology     Digestive   GERD (gastroesophageal reflux disease)    Discussed long-term use of PPIs.  Education provided and patient was very willing to try H2b as pcp had discussed in past.  She  will continue follow-up PCP      Relevant Medications   famotidine (PEPCID) 20 MG tablet     Musculoskeletal and Integument   Allergic urticaria    Refilled EpiPen as requested by patient today.      Relevant Medications   EPINEPHrine 0.3 mg/0.3 mL IJ SOAJ injection       I have discontinued Joaquim Lai A. Simmon's nystatin. I have also changed her famotidine. Additionally, I am having her maintain her aspirin, Propylene Glycol, psyllium, Docusate Calcium (STOOL SOFTENER PO), fexofenadine, cholecalciferol, Vitamin-B Complex, diazepam, atorvastatin,  conjugated estrogens, cyanocobalamin, cycloSPORINE, Levothyroxine Sodium, meclizine, SYRINGE 3CC/20GX1", ALPRAZolam, promethazine, metoprolol succinate, ondansetron, AEROCHAMBER MV, estradiol, tretinoin, urea, diclofenac sodium, omega-3 acid ethyl esters, montelukast, omeprazole, Azelastine-Fluticasone, albuterol, beclomethasone, EPINEPHrine, and amLODipine.   Meds ordered this encounter  Medications  . EPINEPHrine 0.3 mg/0.3 mL IJ SOAJ injection    Sig: Inject 0.3 mLs (0.3 mg total) into the muscle once for 1 dose.    Dispense:  2 Device    Refill:  1    Order Specific Question:   Supervising Provider    Answer:   Deborra Medina L [[id number]]  . famotidine (PEPCID) 20 MG tablet    Sig: Take 1 tablet (20 mg total) by mouth daily as needed for heartburn or indigestion.    Dispense:  90 tablet    Refill:  1    Order Specific Question:   Supervising Provider    Answer:   Deborra Medina L [[id number]]  . amLODipine (NORVASC) 5 MG tablet    Sig: Take 1 tablet (5 mg total) by mouth daily.    Dispense:  90 tablet    Refill:  3    Order Specific Question:   Supervising Provider    Answer:   Crecencio Mc [[id number]]    Return precautions given.   Risks, benefits, and alternatives of the medications and treatment plan prescribed today were discussed, and patient expressed understanding.   Education regarding symptom management and diagnosis given to patient on AVS.  Continue to follow with Crecencio Mc, MD for routine health maintenance.   Angela Mendoza and I agreed with plan.   [name], FNP

## 2018-03-15 DIAGNOSIS — N6011 Diffuse cystic mastopathy of right breast: Secondary | ICD-10-CM | POA: Diagnosis not present

## 2018-03-15 DIAGNOSIS — N6012 Diffuse cystic mastopathy of left breast: Secondary | ICD-10-CM | POA: Diagnosis not present

## 2018-03-16 ENCOUNTER — Ambulatory Visit: Payer: Self-pay | Admitting: *Deleted

## 2018-03-16 NOTE — Telephone Encounter (Addendum)
Patient phoned in reporting ankle and feet edema since 4 days ago after seeing M. Arnett. She feels this is related to being taken off Losartan and starting Norvasc 5 MG almost two weeks ago. She also reports weight gain, nasal stuffiness and constipation since being on this medication. She added feeling of being hot which is unusual for her, along with fatigue. She denies fever, CP, SOB. Weight today 175 B/P 126/86 p.70bpm 122/74,p.71 bpm. Reported her metoprolol dose is at 50 MG right now. Made her appointment for the edema and to discuss the blood pressure medications. She had a office visit for medication follow-up on Thursday but stated she scheduled that visit for a CPE so rescheduled for a CPE in July. Stated she understood that all medications would not be reviewed at tomorrow's appointment.    Reason for Disposition . [1] MILD swelling of both ankles (i.e., pedal edema) AND [2] new onset or worsening  Answer Assessment - Initial Assessment Questions 1. LOCATION: "Which joint is swollen?"    Both ankles and both feet. 2. ONSET: "When did the swelling start?"     Noticed the swelling after seeing M.Arnett last week for a recheck after starting Norvasc and d/c'd Lorsartan  3. SIZE: "How large is the swelling?"     Having her check coloring, she thinks 3+edema. 4. PAIN: "Is there any pain?" If so, ask: "How bad is it?" (Scale 1-10; or mild, moderate, severe)     The tightness hurts.  5. CAUSE: "What do you think caused the swollen joint?"     Fluid retention 6. OTHER SYMPTOMS: "Do you have any other symptoms?" (e.g., fever, chest pain, difficulty breathing, calf pain)     No  7. PREGNANCY: "Is there any chance you are pregnant?" "When was your last menstrual period?"     no  Protocols used: LEG SWELLING AND EDEMA-A-AH, ANKLE SWELLING-A-AH

## 2018-03-17 ENCOUNTER — Encounter: Payer: Self-pay | Admitting: Family

## 2018-03-17 ENCOUNTER — Encounter: Payer: Self-pay | Admitting: Internal Medicine

## 2018-03-17 ENCOUNTER — Telehealth: Payer: Self-pay | Admitting: Internal Medicine

## 2018-03-17 ENCOUNTER — Ambulatory Visit (INDEPENDENT_AMBULATORY_CARE_PROVIDER_SITE_OTHER): Payer: Medicare Other | Admitting: Internal Medicine

## 2018-03-17 ENCOUNTER — Ambulatory Visit: Payer: Medicare Other | Admitting: Family

## 2018-03-17 VITALS — BP 120/72 | HR 77 | Temp 98.0°F | Resp 15 | Ht 65.0 in | Wt 176.4 lb

## 2018-03-17 DIAGNOSIS — E032 Hypothyroidism due to medicaments and other exogenous substances: Secondary | ICD-10-CM | POA: Diagnosis not present

## 2018-03-17 DIAGNOSIS — R002 Palpitations: Secondary | ICD-10-CM

## 2018-03-17 DIAGNOSIS — I10 Essential (primary) hypertension: Secondary | ICD-10-CM | POA: Diagnosis not present

## 2018-03-17 MED ORDER — ATORVASTATIN CALCIUM 40 MG PO TABS: 40.0000 mg | ORAL_TABLET | Freq: Every day | ORAL | 3 refills | Status: DC

## 2018-03-17 MED ORDER — SPIRONOLACTONE 25 MG PO TABS: 25.0000 mg | ORAL_TABLET | Freq: Every day | ORAL | 0 refills | Status: DC

## 2018-03-17 NOTE — Patient Instructions (Addendum)
INCREASE METOPROLOL TO 75  MG STARTING TONIGHT  NO MORE AMLODIPINE     START THE SPIRONOLACTONE DAILY FOR FLUID RETENTION  (TAKE IT IN AM)  INCREASE DOSE TO 50 MG IF NO RESULTS IN 48 HOURS   I WILL REFILL SYNTHROID  ONCE I SEE LABS FROM TODAY

## 2018-03-17 NOTE — Progress Notes (Signed)
Subjective:  Patient ID: Angela Mendoza, female    DOB: 04-04-1951  Age: 67 y.o. MRN: [id number]  CC: The primary encounter diagnosis was Hypothyroidism due to medication. Diagnoses of Palpitations and Essential hypertension, benign were also pertinent to this visit.  HPI Angela Mendoza presents for hypertension management  Two visits with margaret  Earlier in the month  Amlodipine started    On May 8. Due to losartan recall (losartan stopped) developed  Mild edema , started in the ankles and has increased dramatically with a 4 lb weight gain  Metoprolol reduced due to fatigue  On Sunday and by Monday she noted new onset Tremor and palpitations  Thyroid: NEEDS REFILL ON SYNTHROID BRAND NAME ONLY TO [facility] PENDING [facility] TODAY   Outpatient Medications Prior to Visit  Medication Sig Dispense Refill  . albuterol (PROVENTIL HFA) 108 (90 Base) MCG/ACT inhaler Inhale 2 puffs into the lungs every 6 (six) hours as needed. 3 Inhaler 3  . ALPRAZolam (XANAX) 0.25 MG tablet TAKE 1 TABLET (0.25 MG TOTAL) BY MOUTH ONCE DAILY AS NEEDED FOR SLEEP. 30 tablet 2  . aspirin 81 MG tablet Take 81 mg by mouth daily.      . Azelastine-Fluticasone (DYMISTA) 137-50 MCG/ACT SUSP Place 1 spray into the nose 2 (two) times daily. 3 Bottle 3  . B Complex Vitamins (VITAMIN-B COMPLEX) TABS Take 1 tablet by mouth daily.    . beclomethasone (QVAR REDIHALER) 80 MCG/ACT inhaler Inhale 2 puffs into the lungs 2 (two) times daily. 3 Inhaler 3  . cholecalciferol (VITAMIN D) 1000 units tablet Take 1,000 Units by mouth daily.     [name] conjugated estrogens (PREMARIN) vaginal cream Place 1 Applicatorful vaginally 2 (two) times a week. 90 day supply,  Refill for one year 90 g 3  . cyanocobalamin (,VITAMIN B-12,) 1000 MCG/ML injection Inject 1 mL (1,000 mcg total) into the muscle every 30 (thirty) days. 3 mL 3  . cycloSPORINE (RESTASIS) 0.05 % ophthalmic emulsion Place 1 drop into both eyes 2 (two) times daily. 3 each 3  . diazepam  (VALIUM) 5 MG tablet TAKE 1 TABLET BY MOUTH EVERY 6 HOURS AS NEEDED FOR VERTIGO 30 tablet 1  . diclofenac sodium (VOLTAREN) 1 % GEL Apply 2 g topically 4 (four) times daily. 100 g 3  . Docusate Calcium (STOOL SOFTENER PO) Take 1 tablet by mouth as needed.    [name] estradiol (VIVELLE-DOT) 0.1 MG/24HR patch Place 1 patch (0.1 mg total) onto the skin 2 (two) times a week. 24 patch 3  . famotidine (PEPCID) 20 MG tablet Take 1 tablet (20 mg total) by mouth daily as needed for heartburn or indigestion. 90 tablet 1  . fexofenadine (ALLEGRA ALLERGY) 180 MG tablet Take 1 tablet (180 mg total) by mouth daily. 90 tablet 3  . meclizine (ANTIVERT) 25 MG tablet Take 1 tablet (25 mg total) by mouth 3 (three) times daily as needed. 270 tablet 1  . metoprolol succinate (TOPROL-XL) 25 MG 24 hr tablet Take 3 tablets (75 mg total) by mouth daily. (Patient taking differently: Take 50 mg by mouth daily. ) 270 tablet 3  . montelukast (SINGULAIR) 10 MG tablet Take 1 tablet (10 mg total) by mouth at bedtime. 90 tablet 3  . omega-3 acid ethyl esters (LOVAZA) 1 g capsule Take 1 capsule (1 g total) by mouth daily. 90 capsule 3  . ondansetron (ZOFRAN ODT) 4 MG disintegrating tablet Take 1 tablet (4 mg total) by mouth every 8 (eight) hours  as needed for nausea or vomiting. 20 tablet 0  . promethazine (PHENERGAN) 25 MG tablet Take 1 tablet (25 mg total) by mouth every 6 (six) hours as needed for nausea or vomiting. 90 tablet 3  . Propylene Glycol (SYSTANE BALANCE) 0.6 % SOLN Apply 1 drop to eye as needed.    . psyllium (METAMUCIL) 58.6 % powder Take 1 packet by mouth as needed.    [name] Spacer/Aero-Holding Chambers (AEROCHAMBER MV) inhaler Use as instructed 1 each 0  . Syringe/Needle, Disp, (SYRINGE 3CC/20GX1") 20G X 1" 3 ML MISC 1 Syringe by Does not apply route every 30 (thirty) days. For use with b-12 injection monthly 50 each 0  . tretinoin (RETIN-A) 0.01 % gel Apply topically at bedtime. 45 g 3  . urea (CARMOL) 10 % cream Apply  topically as needed. 90 g 3  . amLODipine (NORVASC) 5 MG tablet Take 1 tablet (5 mg total) by mouth daily. 90 tablet 3  . atorvastatin (LIPITOR) 40 MG tablet Take 1 tablet (40 mg total) by mouth daily. 90 tablet 3  . Levothyroxine Sodium 50 MCG CAPS Take 1 capsule (50 mcg total) by mouth daily. 90 capsule 3  . omeprazole (PRILOSEC) 40 MG capsule Take 40 mg by mouth daily.     No facility-administered medications prior to visit.     Review of Systems;  Patient denies headache, fevers, malaise, unintentional weight loss, skin rash, eye pain, sinus congestion and sinus pain, sore throat, dysphagia,  hemoptysis , cough, dyspnea, wheezing, chest pain, palpitations, orthopnea, edema, abdominal pain, nausea, melena, diarrhea, constipation, flank pain, dysuria, hematuria, urinary  Frequency, nocturia, numbness, tingling, seizures,  Focal weakness, Loss of consciousness,  Tremor, insomnia, depression, anxiety, and suicidal ideation.      Objective:  BP 120/72 (BP Location: Left Arm, Patient Position: Sitting, Cuff Size: Normal)   Pulse 77   Temp 98 F (36.7 C) (Oral)   Resp 15   Ht 5\' 5"  (1.651 m)   Wt 176 lb 6.4 oz (80 kg)   SpO2 98%   BMI 29.35 kg/m   BP Readings from Last 3 Encounters:  03/17/18 120/72  03/12/18 122/72  03/03/18 124/80    Wt Readings from Last 3 Encounters:  03/17/18 176 lb 6.4 oz (80 kg)  03/12/18 176 lb 9.6 oz (80.1 kg)  03/03/18 173 lb 4 oz (78.6 kg)    General appearance: alert, cooperative and appears stated age Ears: normal TM's and external ear canals both ears Throat: lips, mucosa, and tongue normal; teeth and gums normal Neck: no adenopathy, no carotid bruit, supple, symmetrical, trachea midline and thyroid not enlarged, symmetric, no tenderness/mass/nodules Back: symmetric, no curvature. ROM normal. No CVA tenderness. Lungs: clear to auscultation bilaterally Heart: regular rate and rhythm, S1, S2 normal, no murmur, click, rub or gallop Abdomen: soft,  non-tender; bowel sounds normal; no masses,  no organomegaly Pulses: 2+ and symmetric Skin: Skin color, texture, turgor normal. No rashes or lesions Lymph nodes: Cervical, supraclavicular, and axillary nodes normal.  Lab Results  Component Value Date   HGBA1C 5.9 10/12/2015    Lab Results  Component Value Date   CREATININE 0.80 03/17/2018   CREATININE 0.77 08/28/2017   CREATININE 0.8 09/26/2016    Lab Results  Component Value Date   WBC 10.2 05/26/2017   HGB 13.2 05/26/2017   HCT 40.0 05/26/2017   PLT 376.0 05/26/2017   GLUCOSE 83 03/17/2018   CHOL 150 02/24/2017   TRIG 117 02/24/2017   HDL 56 02/24/2017  LDLDIRECT 84.0 04/05/2015   LDLCALC 71 02/24/2017   ALT 45 (H) 03/17/2018   AST 31 03/17/2018   NA 139 03/17/2018   K 4.2 03/17/2018   CL 100 03/17/2018   CREATININE 0.80 03/17/2018   BUN 9 03/17/2018   CO2 31 03/17/2018   TSH 2.26 03/17/2018   HGBA1C 5.9 10/12/2015    Dg Chest 2 View  Result Date: 01/08/2018 CLINICAL DATA:  Cough, shortness of breath. EXAM: CHEST - 2 VIEW COMPARISON:  Radiographs of October 30, 2015. FINDINGS: The heart size and mediastinal contours are within normal limits. Both lungs are clear. No pneumothorax or pleural effusion is noted. The visualized skeletal structures are unremarkable. IMPRESSION: No active cardiopulmonary disease. Electronically Signed   By: Marijo Conception, M.D.   On: 01/08/2018 17:22    Assessment & Plan:   Problem List Items Addressed This Visit    Palpitations    RECURRENT since lowering her dose of metoprolol.  Previous dose resumed, 75 mg daily        Relevant Orders   Comprehensive metabolic panel (Completed)   Hypothyroidism - Primary    Thyroid function is WNL on current dose.  No current changes needed. Continue NAME BRAND ONLY SYNTHROID 50  MCG   Lab Results  Component Value Date   TSH 2.26 03/17/2018         Relevant Medications   levothyroxine (SYNTHROID) 50 MCG tablet   Other Relevant Orders     TSH (Completed)   Essential hypertension, benign    Intolerant of amlodipine due to edema and a 4 lb weight gain. Resume metoprolol XL 75 mg daily dose and stop amlodipine.  Starting spironolactone for edema.       Relevant Medications   atorvastatin (LIPITOR) 40 MG tablet   spironolactone (ALDACTONE) 25 MG tablet      I have discontinued Joaquim Lai A. Axelson's Levothyroxine Sodium, omeprazole, and amLODipine. I am also having her start on spironolactone and levothyroxine. Additionally, I am having her maintain her aspirin, Propylene Glycol, psyllium, Docusate Calcium (STOOL SOFTENER PO), fexofenadine, cholecalciferol, Vitamin-B Complex, diazepam, conjugated estrogens, cyanocobalamin, cycloSPORINE, meclizine, SYRINGE 3CC/20GX1", ALPRAZolam, promethazine, metoprolol succinate, ondansetron, AEROCHAMBER MV, estradiol, tretinoin, urea, diclofenac sodium, omega-3 acid ethyl esters, montelukast, Azelastine-Fluticasone, albuterol, beclomethasone, famotidine, and atorvastatin.  Meds ordered this encounter  Medications  . atorvastatin (LIPITOR) 40 MG tablet    Sig: Take 1 tablet (40 mg total) by mouth daily.    Dispense:  90 tablet    Refill:  3  . spironolactone (ALDACTONE) 25 MG tablet    Sig: Take 1 tablet (25 mg total) by mouth daily. FOR FLUID RETENTION    Dispense:  30 tablet    Refill:  0  . levothyroxine (SYNTHROID) 50 MCG tablet    Sig: Take 1 tablet (50 mcg total) by mouth daily before breakfast.    Dispense:  90 tablet    Refill:  1    NAME BRAND ONLY SYNTHROID    Medications Discontinued During This Encounter  Medication Reason  . omeprazole (PRILOSEC) 40 MG capsule Change in therapy  . atorvastatin (LIPITOR) 40 MG tablet Reorder  . amLODipine (NORVASC) 5 MG tablet   . Levothyroxine Sodium 50 MCG CAPS     Follow-up: No follow-ups on file.   Crecencio Mc, MD

## 2018-03-18 ENCOUNTER — Encounter: Payer: Self-pay | Admitting: Internal Medicine

## 2018-03-18 ENCOUNTER — Ambulatory Visit: Payer: Medicare Other | Admitting: Internal Medicine

## 2018-03-18 ENCOUNTER — Encounter

## 2018-03-18 ENCOUNTER — Other Ambulatory Visit: Payer: Medicare Other

## 2018-03-18 LAB — COMPREHENSIVE METABOLIC PANEL
ALT: 45 U/L — ABNORMAL HIGH (ref 0–35)
AST: 31 U/L (ref 0–37)
Albumin: 4.5 g/dL (ref 3.5–5.2)
Alkaline Phosphatase: 72 U/L (ref 39–117)
BUN: 9 mg/dL (ref 6–23)
CO2: 31 mEq/L (ref 19–32)
Calcium: 9.8 mg/dL (ref 8.4–10.5)
Chloride: 100 mEq/L (ref 96–112)
Creatinine, Ser: 0.8 mg/dL (ref 0.40–1.20)
GFR: 75.96 mL/min (ref >60.00)
Glucose, Bld: 83 mg/dL (ref 70–99)
Potassium: 4.2 mEq/L (ref 3.5–5.1)
Sodium: 139 mEq/L (ref 135–145)
Total Bilirubin: 0.7 mg/dL (ref 0.2–1.2)
Total Protein: 7.8 g/dL (ref 6.0–8.3)

## 2018-03-18 LAB — TSH: TSH: 2.26 u[IU]/mL (ref 0.35–4.50)

## 2018-03-19 ENCOUNTER — Other Ambulatory Visit: Payer: Self-pay | Admitting: Internal Medicine

## 2018-03-19 DIAGNOSIS — R7401 Elevation of levels of liver transaminase levels: Secondary | ICD-10-CM

## 2018-03-19 DIAGNOSIS — R740 Nonspecific elevation of levels of transaminase and lactic acid dehydrogenase [LDH]: Principal | ICD-10-CM

## 2018-03-20 MED ORDER — LEVOTHYROXINE SODIUM 50 MCG PO TABS: 50.0000 ug | ORAL_TABLET | Freq: Every day | ORAL | 1 refills | Status: DC

## 2018-03-20 NOTE — Assessment & Plan Note (Signed)
Intolerant of amlodipine due to edema and a 4 lb weight gain. Resume metoprolol XL 75 mg daily dose and stop amlodipine.  Starting spironolactone for edema.

## 2018-03-20 NOTE — Assessment & Plan Note (Addendum)
RECURRENT since lowering her dose of metoprolol.  Previous dose resumed, 75 mg daily

## 2018-03-20 NOTE — Assessment & Plan Note (Signed)
Thyroid function is WNL on current dose.  No current changes needed. Continue NAME BRAND ONLY SYNTHROID 50  MCG   Lab Results  Component Value Date   TSH 2.26 03/17/2018

## 2018-03-24 ENCOUNTER — Ambulatory Visit (INDEPENDENT_AMBULATORY_CARE_PROVIDER_SITE_OTHER): Payer: Medicare Other | Admitting: Pulmonary Disease

## 2018-03-24 ENCOUNTER — Encounter: Payer: Self-pay | Admitting: Pulmonary Disease

## 2018-03-24 VITALS — BP 122/76 | HR 67 | Ht 65.0 in | Wt 174.0 lb

## 2018-03-24 DIAGNOSIS — J45909 Unspecified asthma, uncomplicated: Secondary | ICD-10-CM

## 2018-03-24 DIAGNOSIS — Z9989 Dependence on other enabling machines and devices: Secondary | ICD-10-CM

## 2018-03-24 DIAGNOSIS — G4733 Obstructive sleep apnea (adult) (pediatric): Secondary | ICD-10-CM | POA: Diagnosis not present

## 2018-03-24 DIAGNOSIS — R05 Cough: Secondary | ICD-10-CM

## 2018-03-24 DIAGNOSIS — R058 Other specified cough: Secondary | ICD-10-CM

## 2018-03-24 MED ORDER — AEROCHAMBER MV MISC: 3 refills | Status: DC

## 2018-03-24 MED ORDER — BECLOMETHASONE DIPROP HFA 80 MCG/ACT IN AERB: 2.0000 | INHALATION_SPRAY | Freq: Two times a day (BID) | RESPIRATORY_TRACT | 3 refills | Status: DC

## 2018-03-24 MED ORDER — AZELASTINE-FLUTICASONE 137-50 MCG/ACT NA SUSP
1.0000 | Freq: Two times a day (BID) | NASAL | 3 refills | Status: DC
Start: 2018-03-24 — End: 2018-12-07

## 2018-03-24 NOTE — Progress Notes (Signed)
[facility]  Chief Complaint  Patient presents with  . Follow-up    Pt is doing well overall with cpap machine.    Vital signs: BP 122/76 (BP Location: Left Arm, Cuff Size: Normal)   Pulse 67   Ht 5\' 5"  (1.651 m)   Wt 174 lb (78.9 kg)   SpO2 99%   BMI 28.96 kg/m   History of Present Illness: Angela Mendoza is a 67 y.o. female former smoker with OSA and asthma.  Her throat irritation is better.  Not having much cough, wheeze, or sputum  Still gets some sinus congestion.  Feels inhalers help.  She is doing well with CPAP.  No issues with mask fit.   Physical Exam:  General - pleasant Eyes - pupils reactive ENT - no sinus tenderness, no oral exudate, no LAN Cardiac - regular, no murmur Chest - no wheeze, rales Abd - soft, non tender Ext - no edema Skin - no rashes Neuro - normal strength Psych - normal mood  Assessment/Plan:  Obstructive sleep apnea. - she is compliant with CPAP and reports benefit from therapy - continue CPAP 11 cm H2O  Allergic rhinitis and asthma. - continue Qvar, albuterol - continue dymista, allegra, singulair   Patient Instructions  Follow up in 6 months   Chesley Mires, MD [place] 03/24/2018, 5:17 PM  Flow Sheet  Sleep tests: PSG 09/06/07 >> AHI 19 CPAP 02/21/18 to 03/22/18 >> used on 30 of 30 nights with average 7 hrs 59 min.  Average AHI 0.5 with CPAP 11 cm  Pulmonary tests Spirometry 12/12 >> FEV1% 68 RAST 11/15/14 >> IgE 30, cats/mold PFT 02/07/16 >> FEV1 2.32 (92%), FEV1% 76, FEF 25-75% 1.56 (71%), TLC 4.25 (81%), DLCO 121, borderline BD from FEF 25-75  Past Medical History: She  has a past medical history of ABNORMAL HEART RHYTHMS (06/09/2008), Allergic rhinitis (06/09/2008), Asthma, intrinsic (10/06/2011), Chronic headache (06/09/2008), COPD (chronic obstructive pulmonary disease) ([place]), Depression, Diverticulosis (2013), Dry eyes, GERD (gastroesophageal reflux  disease), History of breast implant removal, Hot flashes, HYPERLIPIDEMIA (06/09/2008), HYPERTENSION (06/09/2008), Hypothyroidism, MVP (mitral valve prolapse), OSA on CPAP, Pernicious anemia, Rosacea, Surgical menopause (1991), Vertigo, and Vitamin D deficiency.  Past Surgical History: She  has a past surgical history that includes Cholecystectomy (2007); Appendectomy (2007); Tubal ligation; Total abdominal hysterectomy (1991); Nasal sinus surgery; Breast enhancement surgery (2006); Cardiovascular stress test (2013); Colonoscopy (08/2012); Esophagogastroduodenoscopy (08/2012); US ECHOCARDIOGRAPHY (09/2009); US ECHOCARDIOGRAPHY (11/2007); sleep study (08/2007); Rectocele repair (12/2008); Colonoscopy (2006); and Breast biopsy (Left, 09/27/2009).  Family History: Her family history includes Alzheimer's disease in her paternal aunt; Aortic dissection in her maternal aunt and paternal uncle; Breast cancer in her maternal aunt; Cancer in her cousin and paternal aunt; Cancer (age of onset: 68) in her father; Cancer (age of onset: 44) in her mother; Heart failure in her sister; Hypertension in her sister; Stroke in her sister.  Social History: She  reports that she quit smoking about 44 years ago. Her smoking use included cigarettes. She has a 2.10 pack-year smoking history. She has never used smokeless tobacco. She reports that she drinks alcohol. She reports that she does not use drugs.  Medications: Allergies as of 03/24/2018      Reactions   Codeine    Low blood pressure/nausea   Sulfonamide Derivatives    rash   Estradiol Rash   Generic estradiol patch d/t the adhesive      Medication List  Accurate as of 03/24/18  5:17 PM. Always use your most recent med list.          AEROCHAMBER MV inhaler Use as instructed   albuterol 108 (90 Base) MCG/ACT inhaler Commonly known as:  PROVENTIL HFA Inhale 2 puffs into the lungs every 6 (six) hours as needed.   ALPRAZolam 0.25 MG tablet Commonly  known as:  XANAX TAKE 1 TABLET (0.25 MG TOTAL) BY MOUTH ONCE DAILY AS NEEDED FOR SLEEP.   aspirin 81 MG tablet Take 81 mg by mouth daily.   atorvastatin 40 MG tablet Commonly known as:  LIPITOR Take 1 tablet (40 mg total) by mouth daily.   Azelastine-Fluticasone 137-50 MCG/ACT Susp Commonly known as:  DYMISTA Place 1 spray into the nose 2 (two) times daily.   beclomethasone 80 MCG/ACT inhaler Commonly known as:  QVAR REDIHALER Inhale 2 puffs into the lungs 2 (two) times daily.   cholecalciferol 1000 units tablet Commonly known as:  VITAMIN D Take 1,000 Units by mouth daily.   conjugated estrogens vaginal cream Commonly known as:  PREMARIN Place 1 Applicatorful vaginally 2 (two) times a week. 90 day supply,  Refill for one year   cyanocobalamin 1000 MCG/ML injection Commonly known as:  (VITAMIN B-12) Inject 1 mL (1,000 mcg total) into the muscle every 30 (thirty) days.   cycloSPORINE 0.05 % ophthalmic emulsion Commonly known as:  RESTASIS Place 1 drop into both eyes 2 (two) times daily.   diazepam 5 MG tablet Commonly known as:  VALIUM TAKE 1 TABLET BY MOUTH EVERY 6 HOURS AS NEEDED FOR VERTIGO   diclofenac sodium 1 % Gel Commonly known as:  VOLTAREN Apply 2 g topically 4 (four) times daily.   estradiol 0.1 MG/24HR patch Commonly known as:  VIVELLE-DOT Place 1 patch (0.1 mg total) onto the skin 2 (two) times a week.   famotidine 20 MG tablet Commonly known as:  PEPCID Take 1 tablet (20 mg total) by mouth daily as needed for heartburn or indigestion.   fexofenadine 180 MG tablet Commonly known as:  ALLEGRA ALLERGY Take 1 tablet (180 mg total) by mouth daily.   levothyroxine 50 MCG tablet Commonly known as:  SYNTHROID Take 1 tablet (50 mcg total) by mouth daily before breakfast.   meclizine 25 MG tablet Commonly known as:  ANTIVERT Take 1 tablet (25 mg total) by mouth 3 (three) times daily as needed.   metoprolol succinate 25 MG 24 hr tablet Commonly known  as:  TOPROL-XL Take 3 tablets (75 mg total) by mouth daily.   montelukast 10 MG tablet Commonly known as:  SINGULAIR Take 1 tablet (10 mg total) by mouth at bedtime.   omega-3 acid ethyl esters 1 g capsule Commonly known as:  LOVAZA Take 1 capsule (1 g total) by mouth daily.   ondansetron 4 MG disintegrating tablet Commonly known as:  ZOFRAN ODT Take 1 tablet (4 mg total) by mouth every 8 (eight) hours as needed for nausea or vomiting.   promethazine 25 MG tablet Commonly known as:  PHENERGAN Take 1 tablet (25 mg total) by mouth every 6 (six) hours as needed for nausea or vomiting.   psyllium 58.6 % powder Commonly known as:  METAMUCIL Take 1 packet by mouth as needed.   STOOL SOFTENER PO Take 1 tablet by mouth as needed.   SYRINGE 3CC/20GX1" 20G X 1" 3 ML Misc 1 Syringe by Does not apply route every 30 (thirty) days. For use with b-12 injection monthly   SYSTANE BALANCE  0.6 % Soln Generic drug:  Propylene Glycol Apply 1 drop to eye as needed.   tretinoin 0.01 % gel Commonly known as:  RETIN-A Apply topically at bedtime.   urea 10 % cream Commonly known as:  CARMOL Apply topically as needed.   Vitamin-B Complex Tabs Take 1 tablet by mouth daily.

## 2018-03-24 NOTE — Patient Instructions (Signed)
Follow up in 6 months 

## 2018-03-31 ENCOUNTER — Ambulatory Visit: Payer: Medicare Other | Admitting: Pulmonary Disease

## 2018-04-01 ENCOUNTER — Ambulatory Visit
Admission: RE | Admit: 2018-04-01 | Discharge: 2018-04-01 | Disposition: A | Discharge status: Home / Self Care | Payer: Medicare Other | Source: Ambulatory Visit | Attending: Vascular Surgery | Admitting: Vascular Surgery

## 2018-04-01 DIAGNOSIS — Z1231 Encounter for screening mammogram for malignant neoplasm of breast: Secondary | ICD-10-CM | POA: Diagnosis not present

## 2018-04-07 ENCOUNTER — Other Ambulatory Visit (INDEPENDENT_AMBULATORY_CARE_PROVIDER_SITE_OTHER): Payer: Medicare Other

## 2018-04-07 DIAGNOSIS — R740 Nonspecific elevation of levels of transaminase and lactic acid dehydrogenase [LDH]: Secondary | ICD-10-CM | POA: Diagnosis not present

## 2018-04-07 DIAGNOSIS — R7401 Elevation of levels of liver transaminase levels: Secondary | ICD-10-CM

## 2018-04-07 LAB — HEPATIC FUNCTION PANEL
ALT: 28 U/L (ref 0–35)
AST: 21 U/L (ref 0–37)
Albumin: 4.2 g/dL (ref 3.5–5.2)
Alkaline Phosphatase: 64 U/L (ref 39–117)
Bilirubin, Direct: 0.2 mg/dL (ref 0.0–0.3)
Total Bilirubin: 0.8 mg/dL (ref 0.2–1.2)
Total Protein: 7 g/dL (ref 6.0–8.3)

## 2018-04-13 ENCOUNTER — Other Ambulatory Visit: Payer: Self-pay | Admitting: Internal Medicine

## 2018-04-28 ENCOUNTER — Encounter: Payer: Medicare Other | Admitting: Internal Medicine

## 2018-04-28 ENCOUNTER — Ambulatory Visit (INDEPENDENT_AMBULATORY_CARE_PROVIDER_SITE_OTHER): Payer: Medicare Other | Admitting: Internal Medicine

## 2018-04-28 ENCOUNTER — Encounter: Payer: Self-pay | Admitting: Internal Medicine

## 2018-04-28 VITALS — BP 128/74 | HR 68 | Temp 98.2°F | Resp 15 | Ht 65.0 in | Wt 174.4 lb

## 2018-04-28 DIAGNOSIS — K2100 Gastro-esophageal reflux disease with esophagitis, without bleeding: Secondary | ICD-10-CM

## 2018-04-28 DIAGNOSIS — N819 Female genital prolapse, unspecified: Secondary | ICD-10-CM | POA: Diagnosis not present

## 2018-04-28 DIAGNOSIS — N993 Prolapse of vaginal vault after hysterectomy: Secondary | ICD-10-CM

## 2018-04-28 DIAGNOSIS — I10 Essential (primary) hypertension: Secondary | ICD-10-CM

## 2018-04-28 DIAGNOSIS — I6789 Other cerebrovascular disease: Secondary | ICD-10-CM

## 2018-04-28 DIAGNOSIS — I679 Cerebrovascular disease, unspecified: Secondary | ICD-10-CM | POA: Diagnosis not present

## 2018-04-28 DIAGNOSIS — R002 Palpitations: Secondary | ICD-10-CM

## 2018-04-28 DIAGNOSIS — E538 Deficiency of other specified B group vitamins: Secondary | ICD-10-CM | POA: Diagnosis not present

## 2018-04-28 DIAGNOSIS — E7800 Pure hypercholesterolemia, unspecified: Secondary | ICD-10-CM | POA: Diagnosis not present

## 2018-04-28 DIAGNOSIS — R5383 Other fatigue: Secondary | ICD-10-CM | POA: Diagnosis not present

## 2018-04-28 DIAGNOSIS — N3946 Mixed incontinence: Secondary | ICD-10-CM

## 2018-04-28 LAB — LIPID PANEL
Cholesterol: 162 mg/dL (ref 0–200)
HDL: 57.2 mg/dL (ref >39.00)
LDL Cholesterol: 84 mg/dL (ref 0–99)
NonHDL: 104.3
Total CHOL/HDL Ratio: 3
Triglycerides: 103 mg/dL (ref 0.0–149.0)
VLDL: 20.6 mg/dL (ref 0.0–40.0)

## 2018-04-28 LAB — CBC WITH DIFFERENTIAL/PLATELET
Basophils Absolute: 27 cells/uL (ref 0–200)
Basophils Relative: 0.3 %
Eosinophils Absolute: 36 cells/uL (ref 15–500)
Eosinophils Relative: 0.4 %
HCT: 39.5 % (ref 35.0–45.0)
Hemoglobin: 13.5 g/dL (ref 11.7–15.5)
Lymphs Abs: 2079 cells/uL (ref 850–3900)
MCH: 31.5 pg (ref 27.0–33.0)
MCHC: 34.2 g/dL (ref 32.0–36.0)
MCV: 92.3 fL (ref 80.0–100.0)
MPV: 10.6 fL (ref 7.5–12.5)
Monocytes Relative: 8.2 %
Neutro Abs: 6120 cells/uL (ref 1500–7800)
Neutrophils Relative %: 68 %
Platelets: 269 10*3/uL (ref 140–400)
RBC: 4.28 10*6/uL (ref 3.80–5.10)
RDW: 11.9 % (ref 11.0–15.0)
Total Lymphocyte: 23.1 %
WBC mixed population: 738 cells/uL (ref 200–950)
WBC: 9 10*3/uL (ref 3.8–10.8)

## 2018-04-28 LAB — COMPREHENSIVE METABOLIC PANEL
ALT: 27 U/L (ref 0–35)
AST: 22 U/L (ref 0–37)
Albumin: 4.3 g/dL (ref 3.5–5.2)
Alkaline Phosphatase: 58 U/L (ref 39–117)
BUN: 11 mg/dL (ref 6–23)
CO2: 28 mEq/L (ref 19–32)
Calcium: 9.4 mg/dL (ref 8.4–10.5)
Chloride: 101 mEq/L (ref 96–112)
Creatinine, Ser: 0.75 mg/dL (ref 0.40–1.20)
GFR: 81.81 mL/min (ref >60.00)
Glucose, Bld: 95 mg/dL (ref 70–99)
Potassium: 4 mEq/L (ref 3.5–5.1)
Sodium: 137 mEq/L (ref 135–145)
Total Bilirubin: 1.3 mg/dL — ABNORMAL HIGH (ref 0.2–1.2)
Total Protein: 7.2 g/dL (ref 6.0–8.3)

## 2018-04-28 LAB — VITAMIN B12: Vitamin B-12: 264 pg/mL (ref 211–911)

## 2018-04-28 LAB — IRON,TIBC AND FERRITIN PANEL
%SAT: 28 % (calc) (ref 16–45)
Ferritin: 116 ng/mL (ref 16–288)
Iron: 98 ug/dL (ref 45–160)
TIBC: 356 mcg/dL (calc) (ref 250–450)

## 2018-04-28 MED ORDER — MECLIZINE HCL 25 MG PO TABS: 25.0000 mg | ORAL_TABLET | Freq: Three times a day (TID) | ORAL | 3 refills | Status: DC | PRN

## 2018-04-28 MED ORDER — FAMOTIDINE 20 MG PO TABS: 20.0000 mg | ORAL_TABLET | Freq: Every day | ORAL | 3 refills | Status: DC | PRN

## 2018-04-28 MED ORDER — TRETINOIN 0.05 % EX CREA: TOPICAL_CREAM | Freq: Every day | CUTANEOUS | 2 refills | Status: DC

## 2018-04-28 MED ORDER — LEVOTHYROXINE SODIUM 50 MCG PO TABS: 50.0000 ug | ORAL_TABLET | Freq: Every day | ORAL | 3 refills | Status: DC

## 2018-04-28 MED ORDER — ATORVASTATIN CALCIUM 40 MG PO TABS: 40.0000 mg | ORAL_TABLET | Freq: Every day | ORAL | 3 refills | Status: DC

## 2018-04-28 MED ORDER — UREA 10 % EX CREA: TOPICAL_CREAM | CUTANEOUS | 3 refills | Status: DC | PRN

## 2018-04-28 MED ORDER — "SYRINGE 20G X 1"" 3 ML MISC": 1.0000 | 0 refills | Status: DC

## 2018-04-28 MED ORDER — OMEGA-3-ACID ETHYL ESTERS 1 G PO CAPS: 1.0000 g | ORAL_CAPSULE | Freq: Every day | ORAL | 3 refills | Status: DC

## 2018-04-28 MED ORDER — DIAZEPAM 5 MG PO TABS
ORAL_TABLET | ORAL | 2 refills | Status: DC
Start: 2018-04-28 — End: 2019-05-27

## 2018-04-28 MED ORDER — CYANOCOBALAMIN 1000 MCG/ML IJ SOLN: 1000.0000 ug | INTRAMUSCULAR | 3 refills | Status: DC

## 2018-04-28 MED ORDER — METOPROLOL SUCCINATE ER 25 MG PO TB24: 75.0000 mg | ORAL_TABLET | Freq: Every day | ORAL | 3 refills | Status: DC

## 2018-04-28 MED ORDER — ESTROGENS, CONJUGATED 0.625 MG/GM VA CREA: 1.0000 | TOPICAL_CREAM | VAGINAL | 3 refills | Status: DC

## 2018-04-28 MED ORDER — ESTRADIOL 0.1 MG/24HR TD PTTW: 1.0000 | MEDICATED_PATCH | TRANSDERMAL | 3 refills | Status: DC

## 2018-04-28 NOTE — Patient Instructions (Signed)
Cardiology and urology referrals are in  Progress  Reduce aspirin to once or twice per week

## 2018-04-28 NOTE — Progress Notes (Signed)
Subjective:  Patient ID: Angela Mendoza, female    DOB: [birth year]  Age: 67 y.o. MRN: [id number]  CC: The primary encounter diagnosis was Fatigue, unspecified type. Diagnoses of Pure hypercholesterolemia, B12 deficiency, Palpitations, Gastroesophageal reflux disease with esophagitis, Cerebral microvascular disease, Essential hypertension, benign, Vaginal vault prolapse after hysterectomy, and Mixed urinary incontinence due to female genital prolapse were also pertinent to this visit.  HPI MELESSIA KAUS presents for follow up on multiple issues.  Last seen 6 weeks ago.  1) Hypertension: patient checks blood pressure twice weekly at home.  Readings have been for the most part < 130 /80 at rest . Patient is following a reduced salt diet most days and is taking medications as prescribed (Metoprolol 75 mg  )  2) Excessive fatigue:  Not eating well due to husband's illness .  Wants irona nd lipids checked today .  Sleep disrupted by frequent nocturia ,  Stress incontinence    3) Vaginal vault prolapse:   Prior urogyn eval at [facility].  History of rectocele correction 5 years ago Suggested a bladder tack .  discuSsed referral  To a female  Urologist AT [facility] (NEED NAME)  4) CVD:  discussed reducing aspirin use to every 3-4 days due to bruising.  (MRI brain reviewed)  5) Requesting Cardiology referral for history of atrial fib, MVR, recurrent palpitations        Lab Results  Component Value Date   VITAMINB12 264 04/28/2018     Outpatient Medications Prior to Visit  Medication Sig Dispense Refill  . albuterol (PROVENTIL HFA) 108 (90 Base) MCG/ACT inhaler Inhale 2 puffs into the lungs every 6 (six) hours as needed. 3 Inhaler 3  . ALPRAZolam (XANAX) 0.25 MG tablet TAKE 1 TABLET (0.25 MG TOTAL) BY MOUTH ONCE DAILY AS NEEDED FOR SLEEP. 30 tablet 2  . aspirin 81 MG tablet Take 81 mg by mouth daily.      . Azelastine-Fluticasone (DYMISTA) 137-50 MCG/ACT SUSP Place 1 spray into the  nose 2 (two) times daily. 3 Bottle 3  . B Complex Vitamins (VITAMIN-B COMPLEX) TABS Take 1 tablet by mouth daily.    . beclomethasone (QVAR REDIHALER) 80 MCG/ACT inhaler Inhale 2 puffs into the lungs 2 (two) times daily. 3 Inhaler 3  . cholecalciferol (VITAMIN D) 1000 units tablet Take 1,000 Units by mouth daily.     . cycloSPORINE (RESTASIS) 0.05 % ophthalmic emulsion Place 1 drop into both eyes 2 (two) times daily. 3 each 3  . Docusate Calcium (STOOL SOFTENER PO) Take 1 tablet by mouth as needed.    . fexofenadine (ALLEGRA ALLERGY) 180 MG tablet Take 1 tablet (180 mg total) by mouth daily. 90 tablet 3  . montelukast (SINGULAIR) 10 MG tablet Take 1 tablet (10 mg total) by mouth at bedtime. 90 tablet 3  . ondansetron (ZOFRAN ODT) 4 MG disintegrating tablet Take 1 tablet (4 mg total) by mouth every 8 (eight) hours as needed for nausea or vomiting. 20 tablet 0  . promethazine (PHENERGAN) 25 MG tablet Take 1 tablet (25 mg total) by mouth every 6 (six) hours as needed for nausea or vomiting. 90 tablet 3  . Propylene Glycol (SYSTANE BALANCE) 0.6 % SOLN Apply 1 drop to eye as needed.    . psyllium (METAMUCIL) 58.6 % powder Take 1 packet by mouth as needed.    [name] Spacer/Aero-Holding Chambers (AEROCHAMBER MV) inhaler Use as instructed 1 each 3  . atorvastatin (LIPITOR) 40 MG tablet Take 1  tablet (40 mg total) by mouth daily. 90 tablet 3  . conjugated estrogens (PREMARIN) vaginal cream Place 1 Applicatorful vaginally 2 (two) times a week. 90 day supply,  Refill for one year 90 g 3  . cyanocobalamin (,VITAMIN B-12,) 1000 MCG/ML injection Inject 1 mL (1,000 mcg total) into the muscle every 30 (thirty) days. 3 mL 3  . diazepam (VALIUM) 5 MG tablet TAKE 1 TABLET BY MOUTH EVERY 6 HOURS AS NEEDED FOR VERTIGO 30 tablet 1  . estradiol (VIVELLE-DOT) 0.1 MG/24HR patch Place 1 patch (0.1 mg total) onto the skin 2 (two) times a week. 24 patch 3  . famotidine (PEPCID) 20 MG tablet Take 1 tablet (20 mg total) by mouth  daily as needed for heartburn or indigestion. 90 tablet 1  . levothyroxine (SYNTHROID) 50 MCG tablet Take 1 tablet (50 mcg total) by mouth daily before breakfast. 90 tablet 1  . meclizine (ANTIVERT) 25 MG tablet Take 1 tablet (25 mg total) by mouth 3 (three) times daily as needed. 270 tablet 1  . metoprolol succinate (TOPROL-XL) 25 MG 24 hr tablet Take 3 tablets (75 mg total) by mouth daily. (Patient taking differently: Take 50 mg by mouth daily. ) 270 tablet 3  . omega-3 acid ethyl esters (LOVAZA) 1 g capsule Take 1 capsule (1 g total) by mouth daily. 90 capsule 3  . Syringe/Needle, Disp, (SYRINGE 3CC/20GX1") 20G X 1" 3 ML MISC 1 Syringe by Does not apply route every 30 (thirty) days. For use with b-12 injection monthly 50 each 0  . tretinoin (RETIN-A) 0.01 % gel Apply topically at bedtime. 45 g 3  . urea (CARMOL) 10 % cream Apply topically as needed. 90 g 3  . diclofenac sodium (VOLTAREN) 1 % GEL Apply 2 g topically 4 (four) times daily. (Patient not taking: Reported on 04/28/2018) 100 g 3  . spironolactone (ALDACTONE) 25 MG tablet TAKE 1 TABLET (25 MG TOTAL) BY MOUTH DAILY. FOR FLUID RETENTION (Patient not taking: Reported on 04/28/2018) 30 tablet 2   No facility-administered medications prior to visit.     Review of Systems;  Patient denies headache, fevers, malaise, unintentional weight loss, skin rash, eye pain, sinus congestion and sinus pain, sore throat, dysphagia,  hemoptysis , cough, dyspnea, wheezing, chest pain, , orthopnea, edema, abdominal pain, nausea, melena, diarrhea, constipation, flank pain, dysuria, hematuria, , numbness, tingling, seizures,  Focal weakness, Loss of consciousness,  Tremor, insomnia, depression, anxiety, and suicidal ideation.      Objective:  BP 128/74 (BP Location: Left Arm, Patient Position: Sitting, Cuff Size: Normal)   Pulse 68   Temp 98.2 F (36.8 C) (Oral)   Resp 15   Ht 5\' 5"  (1.651 m)   Wt 174 lb 6.4 oz (79.1 kg)   SpO2 96%   BMI 29.02 kg/m    BP Readings from Last 3 Encounters:  04/28/18 128/74  03/24/18 122/76  03/17/18 120/72    Wt Readings from Last 3 Encounters:  04/28/18 174 lb 6.4 oz (79.1 kg)  03/24/18 174 lb (78.9 kg)  03/17/18 176 lb 6.4 oz (80 kg)    General appearance: alert, cooperative and appears stated age Ears: normal TM's and external ear canals both ears Throat: lips, mucosa, and tongue normal; teeth and gums normal Neck: no adenopathy, no carotid bruit, supple, symmetrical, trachea midline and thyroid not enlarged, symmetric, no tenderness/mass/nodules Back: symmetric, no curvature. ROM normal. No CVA tenderness. Lungs: clear to auscultation bilaterally Heart: regular rate and rhythm, S1, S2 normal, no murmur,  click, rub or gallop Abdomen: soft, non-tender; bowel sounds normal; no masses,  no organomegaly Pulses: 2+ and symmetric Skin: Skin color, texture, turgor normal. No rashes or lesions Lymph nodes: Cervical, supraclavicular, and axillary nodes normal.  Lab Results  Component Value Date   HGBA1C 5.9 10/12/2015    Lab Results  Component Value Date   CREATININE 0.75 04/28/2018   CREATININE 0.80 03/17/2018   CREATININE 0.77 08/28/2017    Lab Results  Component Value Date   WBC 9.0 04/28/2018   HGB 13.5 04/28/2018   HCT 39.5 04/28/2018   PLT 269 04/28/2018   GLUCOSE 95 04/28/2018   CHOL 162 04/28/2018   TRIG 103.0 04/28/2018   HDL 57.20 04/28/2018   LDLDIRECT 84.0 04/05/2015   LDLCALC 84 04/28/2018   ALT 27 04/28/2018   AST 22 04/28/2018   NA 137 04/28/2018   K 4.0 04/28/2018   CL 101 04/28/2018   CREATININE 0.75 04/28/2018   BUN 11 04/28/2018   CO2 28 04/28/2018   TSH 2.26 03/17/2018   HGBA1C 5.9 10/12/2015    Mm 3d Screen Breast Bilateral  Result Date: 04/01/2018 CLINICAL DATA:  Screening. History of bilateral breast silicone implant augmentation, post explantation. EXAM: DIGITAL SCREENING BILATERAL MAMMOGRAM WITH TOMO AND CAD COMPARISON:  Previous exam(s). ACR Breast  Density Category c: The breast tissue is heterogeneously dense, which may obscure small masses. FINDINGS: There are no findings suspicious for malignancy. Postsurgical changes in bilateral breast, stable. Hyperdense lymph nodes in the right axilla, and area of free silicone in the left breast, also stable. Images were processed with CAD. IMPRESSION: No mammographic evidence of malignancy. A result letter of this screening mammogram will be mailed directly to the patient. RECOMMENDATION: Screening mammogram in one year. (Code:SM-B-01Y) BI-RADS CATEGORY  2: Benign. Electronically Signed   By: Fidela Salisbury M.D.   On: 04/01/2018 14:07    Assessment & Plan:   Problem List Items Addressed This Visit    Hyperlipemia   Relevant Medications   atorvastatin (LIPITOR) 40 MG tablet   omega-3 acid ethyl esters (LOVAZA) 1 g capsule   metoprolol succinate (TOPROL-XL) 25 MG 24 hr tablet   Other Relevant Orders   Lipid panel (Completed)   GERD (gastroesophageal reflux disease)   Relevant Medications   famotidine (PEPCID) 20 MG tablet   meclizine (ANTIVERT) 25 MG tablet   Essential hypertension, benign    Well controlled on current regimen. Renal function stable, no changes today. Lab Results  Component Value Date   CREATININE 0.75 04/28/2018   Lab Results  Component Value Date   NA 137 04/28/2018   K 4.0 04/28/2018   CL 101 04/28/2018   CO2 28 04/28/2018         Relevant Medications   atorvastatin (LIPITOR) 40 MG tablet   omega-3 acid ethyl esters (LOVAZA) 1 g capsule   metoprolol succinate (TOPROL-XL) 25 MG 24 hr tablet   Palpitations    RECURRENT since lowering her dose of metoprolol.  Previous dose resumed, 75 mg daily  .  She has a history of MVP and atrial fibrillation and is requesting cardiology referral      Relevant Orders   Ambulatory referral to Cardiology   Cerebral microvascular disease    By MRI .  Reduce aspirin use to 2-3 times per week due to excessive bruising.  She  has had no events.  LDL is < 100 today  Lab Results  Component Value Date   CHOL 162 04/28/2018   HDL  57.20 04/28/2018   LDLCALC 84 04/28/2018   LDLDIRECT 84.0 04/05/2015   TRIG 103.0 04/28/2018   CHOLHDL 3 04/28/2018         Relevant Medications   atorvastatin (LIPITOR) 40 MG tablet   omega-3 acid ethyl esters (LOVAZA) 1 g capsule   metoprolol succinate (TOPROL-XL) 25 MG 24 hr tablet   Vaginal vault prolapse after hysterectomy    With mixed urge and stress incontinence.  Last urogyn eval was in 2016 at [facility].  Requesting second opinion by local urology.       Relevant Orders   Ambulatory referral to Urology   Fatigue - Primary    Screening labs normal.  No history of snoring.  Recommended participating in regular exercise program with goal of achieving a minimum of 30 minutes of aerobic activity 5 days per week.   Lab Results  Component Value Date   CREATININE 0.79 11/26/2015   Lab Results  Component Value Date   ALT 14 11/26/2015   AST 18 11/26/2015   ALKPHOS 69 11/26/2015   BILITOT 0.6 11/26/2015   Lab Results  Component Value Date   TSH 1.68 11/26/2015   Lab Results  Component Value Date   WBC 8.6 11/26/2015   HGB 14.7 11/26/2015   HCT 43.8 11/26/2015   MCV 92.7 11/26/2015   PLT 248.0 11/26/2015    Lab Results  Component Value Date   WBC 9.0 04/28/2018   HGB 13.5 04/28/2018   HCT 39.5 04/28/2018   MCV 92.3 04/28/2018   PLT 269 04/28/2018   Lab Results  Component Value Date   IRON 98 04/28/2018   TIBC 356 04/28/2018   FERRITIN 116 04/28/2018   Lab Results  Component Value Date   CREATININE 0.75 04/28/2018   Lab Results  Component Value Date   TSH 2.26 03/17/2018         Relevant Orders   Comprehensive metabolic panel (Completed)   Iron, TIBC and Ferritin Panel (Completed)   CBC w/Diff (Completed)    Other Visit Diagnoses    B12 deficiency       Relevant Orders   Vitamin B12 (Completed)   Mixed urinary incontinence due to female  genital prolapse       Relevant Orders   Ambulatory referral to Urology     A total of 40 minutes of face to face time was spent with patient more than half of which was spent in counselling about multiple problems,  Outlined above,  and coordination of care   ` I have discontinued Joaquim Lai A. Franks's tretinoin, diclofenac sodium, and spironolactone. I am also having her start on tretinoin. Additionally, I am having her maintain her aspirin, Propylene Glycol, psyllium, Docusate Calcium (STOOL SOFTENER PO), fexofenadine, cholecalciferol, Vitamin-B Complex, cycloSPORINE, ALPRAZolam, promethazine, ondansetron, montelukast, albuterol, AEROCHAMBER MV, beclomethasone, Azelastine-Fluticasone, diazepam, estradiol, SYRINGE 3CC/20GX1", urea, atorvastatin, cyanocobalamin, conjugated estrogens, famotidine, levothyroxine, meclizine, omega-3 acid ethyl esters, and metoprolol succinate.  Meds ordered this encounter  Medications  . diazepam (VALIUM) 5 MG tablet    Sig: TAKE 1 TABLET BY MOUTH EVERY 6 HOURS AS NEEDED FOR VERTIGO    Dispense:  30 tablet    Refill:  2    This request is for a new prescription for a controlled substance as required by Federal/State law..  . estradiol (VIVELLE-DOT) 0.1 MG/24HR patch    Sig: Place 1 patch (0.1 mg total) onto the skin 2 (two) times a week.    Dispense:  24 patch    Refill:  3    NAME BRAND ONLY ,  . Syringe/Needle, Disp, (SYRINGE 3CC/20GX1") 20G X 1" 3 ML MISC    Sig: 1 Syringe by Does not apply route every 30 (thirty) days. For use with b-12 injection monthly    Dispense:  50 each    Refill:  0    For B-12 Injections  . tretinoin (RETIN-A) 0.05 % cream    Sig: Apply topically at bedtime.    Dispense:  45 g    Refill:  2  . urea (CARMOL) 10 % cream    Sig: Apply topically as needed.    Dispense:  90 g    Refill:  3  . atorvastatin (LIPITOR) 40 MG tablet    Sig: Take 1 tablet (40 mg total) by mouth daily.    Dispense:  90 tablet    Refill:  3  .  cyanocobalamin (,VITAMIN B-12,) 1000 MCG/ML injection    Sig: Inject 1 mL (1,000 mcg total) into the muscle every 30 (thirty) days.    Dispense:  3 mL    Refill:  3  . conjugated estrogens (PREMARIN) vaginal cream    Sig: Place 1 Applicatorful vaginally 2 (two) times a week. 90 day supply,  Refill for one year    Dispense:  90 g    Refill:  3  . famotidine (PEPCID) 20 MG tablet    Sig: Take 1 tablet (20 mg total) by mouth daily as needed for heartburn or indigestion.    Dispense:  90 tablet    Refill:  3  . levothyroxine (SYNTHROID) 50 MCG tablet    Sig: Take 1 tablet (50 mcg total) by mouth daily before breakfast.    Dispense:  90 tablet    Refill:  3    NAME BRAND ONLY SYNTHROID  . meclizine (ANTIVERT) 25 MG tablet    Sig: Take 1 tablet (25 mg total) by mouth 3 (three) times daily as needed.    Dispense:  270 tablet    Refill:  3  . omega-3 acid ethyl esters (LOVAZA) 1 g capsule    Sig: Take 1 capsule (1 g total) by mouth daily.    Dispense:  90 capsule    Refill:  3    BRAND NAME ONLY  . metoprolol succinate (TOPROL-XL) 25 MG 24 hr tablet    Sig: Take 3 tablets (75 mg total) by mouth daily.    Dispense:  270 tablet    Refill:  3    Medications Discontinued During This Encounter  Medication Reason  . diclofenac sodium (VOLTAREN) 1 % GEL Patient has not taken in last 30 days  . spironolactone (ALDACTONE) 25 MG tablet Patient has not taken in last 30 days  . diazepam (VALIUM) 5 MG tablet Reorder  . tretinoin (RETIN-A) 0.01 % gel Change in therapy  . estradiol (VIVELLE-DOT) 0.1 MG/24HR patch Reorder  . Syringe/Needle, Disp, (SYRINGE 3CC/20GX1") 20G X 1" 3 ML MISC Reorder  . urea (CARMOL) 10 % cream Reorder  . atorvastatin (LIPITOR) 40 MG tablet Reorder  . cyanocobalamin (,VITAMIN B-12,) 1000 MCG/ML injection Reorder  . conjugated estrogens (PREMARIN) vaginal cream Reorder  . famotidine (PEPCID) 20 MG tablet Reorder  . levothyroxine (SYNTHROID) 50 MCG tablet Reorder  .  meclizine (ANTIVERT) 25 MG tablet Reorder  . omega-3 acid ethyl esters (LOVAZA) 1 g capsule Reorder  . metoprolol succinate (TOPROL-XL) 25 MG 24 hr tablet Reorder    Follow-up: Return in about 6 months (around 10/29/2018).  Crecencio Mc, MD

## 2018-05-01 DIAGNOSIS — R5383 Other fatigue: Secondary | ICD-10-CM | POA: Insufficient documentation

## 2018-05-01 DIAGNOSIS — N993 Prolapse of vaginal vault after hysterectomy: Secondary | ICD-10-CM | POA: Insufficient documentation

## 2018-05-01 NOTE — Assessment & Plan Note (Signed)
With mixed urge and stress incontinence.  Last urogyn eval was in 2016 at [facility].  Requesting second opinion by local urology.

## 2018-05-01 NOTE — Assessment & Plan Note (Signed)
RECURRENT since lowering her dose of metoprolol.  Previous dose resumed, 75 mg daily  .  She has a history of MVP and atrial fibrillation and is requesting cardiology referral

## 2018-05-01 NOTE — Assessment & Plan Note (Signed)
Well controlled on current regimen. Renal function stable, no changes today. Lab Results  Component Value Date   CREATININE 0.75 04/28/2018   Lab Results  Component Value Date   NA 137 04/28/2018   K 4.0 04/28/2018   CL 101 04/28/2018   CO2 28 04/28/2018

## 2018-05-01 NOTE — Assessment & Plan Note (Signed)
Screening labs normal.  No history of snoring.  Recommended participating in regular exercise program with goal of achieving a minimum of 30 minutes of aerobic activity 5 days per week.   Lab Results  Component Value Date   CREATININE 0.79 11/26/2015   Lab Results  Component Value Date   ALT 14 11/26/2015   AST 18 11/26/2015   ALKPHOS 69 11/26/2015   BILITOT 0.6 11/26/2015   Lab Results  Component Value Date   TSH 1.68 11/26/2015   Lab Results  Component Value Date   WBC 8.6 11/26/2015   HGB 14.7 11/26/2015   HCT 43.8 11/26/2015   MCV 92.7 11/26/2015   PLT 248.0 11/26/2015    Lab Results  Component Value Date   WBC 9.0 04/28/2018   HGB 13.5 04/28/2018   HCT 39.5 04/28/2018   MCV 92.3 04/28/2018   PLT 269 04/28/2018   Lab Results  Component Value Date   IRON 98 04/28/2018   TIBC 356 04/28/2018   FERRITIN 116 04/28/2018   Lab Results  Component Value Date   CREATININE 0.75 04/28/2018   Lab Results  Component Value Date   TSH 2.26 03/17/2018

## 2018-05-01 NOTE — Assessment & Plan Note (Signed)
By MRI .  Reduce aspirin use to 2-3 times per week due to excessive bruising.  She has had no events.  LDL is < 100 today  Lab Results  Component Value Date   CHOL 162 04/28/2018   HDL 57.20 04/28/2018   LDLCALC 84 04/28/2018   LDLDIRECT 84.0 04/05/2015   TRIG 103.0 04/28/2018   CHOLHDL 3 04/28/2018

## 2018-05-02 ENCOUNTER — Other Ambulatory Visit: Payer: Self-pay | Admitting: Internal Medicine

## 2018-05-02 DIAGNOSIS — E538 Deficiency of other specified B group vitamins: Secondary | ICD-10-CM

## 2018-05-05 ENCOUNTER — Telehealth: Payer: Self-pay

## 2018-05-05 ENCOUNTER — Encounter: Payer: Self-pay | Admitting: Internal Medicine

## 2018-05-05 NOTE — Telephone Encounter (Signed)
Copied from [place] [phone]. Topic: Inquiry >> May 05, 2018  3:47 PM Oliver Pila B wrote: Reason for CRM: pt called to get better clarity on a mychart message she got about getting her b12 shot and labs done, contact pt to clarify

## 2018-05-05 NOTE — Telephone Encounter (Signed)
fyi

## 2018-05-06 ENCOUNTER — Ambulatory Visit (INDEPENDENT_AMBULATORY_CARE_PROVIDER_SITE_OTHER): Payer: Medicare Other | Admitting: *Deleted

## 2018-05-06 ENCOUNTER — Other Ambulatory Visit (INDEPENDENT_AMBULATORY_CARE_PROVIDER_SITE_OTHER): Payer: Medicare Other

## 2018-05-06 DIAGNOSIS — E538 Deficiency of other specified B group vitamins: Secondary | ICD-10-CM

## 2018-05-06 MED ORDER — CYANOCOBALAMIN 1000 MCG/ML IJ SOLN
1000.0000 ug | Freq: Once | INTRAMUSCULAR | Status: AC
  Administered 2018-05-06: 1000 ug via INTRAMUSCULAR

## 2018-05-06 NOTE — Telephone Encounter (Signed)
Spoke with pt and explained to pt in better detail what the mychart message meant about the b12 level and getting the b12 injections. Pt gave a verbal understanding.

## 2018-05-06 NOTE — Progress Notes (Signed)
Patient presented for B 12 injection to left deltoid, patient voiced no concerns nor showed any signs of distress during injection.  First of  Three weekly injections.

## 2018-05-08 ENCOUNTER — Telehealth: Payer: Self-pay | Admitting: Internal Medicine

## 2018-05-09 LAB — FOLATE RBC: RBC Folate: 657 ng/mL RBC (ref >280)

## 2018-05-09 LAB — INTRINSIC FACTOR ANTIBODIES: Intrinsic Factor: NEGATIVE

## 2018-05-10 ENCOUNTER — Other Ambulatory Visit: Payer: Self-pay | Admitting: Internal Medicine

## 2018-05-10 NOTE — Telephone Encounter (Signed)
Reviewed lab results and physician's note with patient.  She is requesting prescriptions for the Vit. B12  oral tabs for a 90 day supply  And a prescription for Vit. D 2000 Iu  Tab daily to be sent to her [facility] prescription [facility].  Please include a 90 day supply and diagnosis on both prescriptions in order for insurance to pay.

## 2018-05-11 ENCOUNTER — Other Ambulatory Visit: Payer: Self-pay

## 2018-05-11 DIAGNOSIS — E538 Deficiency of other specified B group vitamins: Secondary | ICD-10-CM

## 2018-05-11 MED ORDER — VITAMIN D3 50 MCG (2000 UT) PO TABS: 1.0000 | ORAL_TABLET | Freq: Every day | ORAL | 3 refills | Status: DC

## 2018-05-11 MED ORDER — VITAMIN B-12 1000 MCG PO TABS: 1000.0000 ug | ORAL_TABLET | Freq: Every day | ORAL | 3 refills | Status: DC

## 2018-05-11 NOTE — Telephone Encounter (Signed)
Scripts sent electronically.

## 2018-05-11 NOTE — Addendum Note (Signed)
Addended by: Nanci Pina on: 05/11/2018 09:48 AM   Modules accepted: Orders

## 2018-05-13 ENCOUNTER — Ambulatory Visit (INDEPENDENT_AMBULATORY_CARE_PROVIDER_SITE_OTHER): Payer: Medicare Other | Admitting: *Deleted

## 2018-05-13 DIAGNOSIS — E538 Deficiency of other specified B group vitamins: Secondary | ICD-10-CM

## 2018-05-13 DIAGNOSIS — S92215A Nondisplaced fracture of cuboid bone of left foot, initial encounter for closed fracture: Secondary | ICD-10-CM | POA: Diagnosis not present

## 2018-05-13 DIAGNOSIS — S93402A Sprain of unspecified ligament of left ankle, initial encounter: Secondary | ICD-10-CM | POA: Diagnosis not present

## 2018-05-13 MED ORDER — CYANOCOBALAMIN 1000 MCG/ML IJ SOLN
1000.0000 ug | Freq: Once | INTRAMUSCULAR | Status: AC
  Administered 2018-05-13: 1000 ug via INTRAMUSCULAR

## 2018-05-13 MED ORDER — OMEGA-3-ACID ETHYL ESTERS 1 G PO CAPS: 1.0000 g | ORAL_CAPSULE | Freq: Every day | ORAL | 3 refills | Status: DC

## 2018-05-13 NOTE — Progress Notes (Signed)
Patient presented for B 12 injection to right deltoid, patient voiced no concerns nor showed any signs of distress during injection. 

## 2018-05-19 ENCOUNTER — Ambulatory Visit: Payer: Medicare Other

## 2018-05-20 ENCOUNTER — Ambulatory Visit (INDEPENDENT_AMBULATORY_CARE_PROVIDER_SITE_OTHER): Payer: Medicare Other

## 2018-05-20 VITALS — BP 126/62 | HR 67 | Temp 98.2°F | Resp 15 | Ht 65.0 in | Wt 178.1 lb

## 2018-05-20 DIAGNOSIS — E538 Deficiency of other specified B group vitamins: Secondary | ICD-10-CM | POA: Diagnosis not present

## 2018-05-20 DIAGNOSIS — Z0000 Encounter for general adult medical examination without abnormal findings: Secondary | ICD-10-CM

## 2018-05-20 MED ORDER — CYANOCOBALAMIN 1000 MCG/ML IJ SOLN
1000.0000 ug | Freq: Once | INTRAMUSCULAR | Status: AC
  Administered 2018-05-20: 1000 ug via INTRAMUSCULAR

## 2018-05-20 NOTE — Patient Instructions (Addendum)
  Ms. Like , Thank you for taking time to come for your Medicare Wellness Visit. I appreciate your ongoing commitment to your health goals. Please review the following plan we discussed and let me know if I can assist you in the future.   These are the goals we discussed: Goals    . Increase physical activity     Core strengthening exercises for balance. Educational material provided.     . Low carb diet     Portion control       This is a list of the screening recommended for you and due dates:  Health Maintenance  Topic Date Due  . Flu Shot  05/27/2018  . Mammogram  04/01/2020  . Colon Cancer Screening  09/02/2022  . Tetanus Vaccine  02/26/2027  . DEXA scan (bone density measurement)  Completed  .  Hepatitis C: One time screening is recommended by Center for Disease Control  (CDC) for  adults born from [birth year] through 1965.   Completed  . Pneumonia vaccines  Completed

## 2018-05-20 NOTE — Progress Notes (Addendum)
Subjective:   Angela Mendoza is a 67 y.o. female who presents for an Initial Medicare Annual Wellness Visit.  Review of Systems    No ROS.  Medicare Wellness Visit. Additional risk factors are reflected in the social history.  Cardiac Risk Factors include: advanced age (>54men, >27 women);hypertension     Objective:    Today's Vitals   05/20/18 1044 05/20/18 1045  BP: 126/62   Pulse: 67   Resp: 15   Temp: 98.2 F (36.8 C)   TempSrc: Oral   SpO2: 99%   Weight: 178 lb 1.9 oz (80.8 kg)   Height: 5\' 5"  (1.651 m)   PainSc:  2    Body mass index is 29.64 kg/m.  Advanced Directives 05/20/2018  Does Patient Have a Medical Advance Directive? No  Would patient like information on creating a medical advance directive? Yes (MAU/Ambulatory/Procedural Areas - Information given)    Current Medications (verified) Outpatient Encounter Medications as of 05/20/2018  Medication Sig  . albuterol (PROVENTIL HFA) 108 (90 Base) MCG/ACT inhaler Inhale 2 puffs into the lungs every 6 (six) hours as needed.  . ALPRAZolam (XANAX) 0.25 MG tablet TAKE 1 TABLET (0.25 MG TOTAL) BY MOUTH ONCE DAILY AS NEEDED FOR SLEEP.  [name] aspirin 81 MG tablet Take 81 mg by mouth daily.    [name] atorvastatin (LIPITOR) 40 MG tablet Take 1 tablet (40 mg total) by mouth daily.  . Azelastine-Fluticasone (DYMISTA) 137-50 MCG/ACT SUSP Place 1 spray into the nose 2 (two) times daily.  . B Complex Vitamins (VITAMIN-B COMPLEX) TABS Take 1 tablet by mouth daily.  . beclomethasone (QVAR REDIHALER) 80 MCG/ACT inhaler Inhale 2 puffs into the lungs 2 (two) times daily.  . cholecalciferol (VITAMIN D) 1000 units tablet Take 1,000 Units by mouth daily.   . Cholecalciferol (VITAMIN D3) 2000 units TABS Take 1 tablet by mouth daily.  [name] conjugated estrogens (PREMARIN) vaginal cream Place 1 Applicatorful vaginally 2 (two) times a week. 90 day supply,  Refill for one year  . cyanocobalamin (,VITAMIN B-12,) 1000 MCG/ML injection Inject 1 mL  (1,000 mcg total) into the muscle every 30 (thirty) days.  . cycloSPORINE (RESTASIS) 0.05 % ophthalmic emulsion Place 1 drop into both eyes 2 (two) times daily.  . diazepam (VALIUM) 5 MG tablet TAKE 1 TABLET BY MOUTH EVERY 6 HOURS AS NEEDED FOR VERTIGO  . Docusate Calcium (STOOL SOFTENER PO) Take 1 tablet by mouth as needed.  [name] estradiol (VIVELLE-DOT) 0.1 MG/24HR patch Place 1 patch (0.1 mg total) onto the skin 2 (two) times a week.  . famotidine (PEPCID) 20 MG tablet Take 1 tablet (20 mg total) by mouth daily as needed for heartburn or indigestion.  . fexofenadine (ALLEGRA ALLERGY) 180 MG tablet Take 1 tablet (180 mg total) by mouth daily.  [name] levothyroxine (SYNTHROID) 50 MCG tablet Take 1 tablet (50 mcg total) by mouth daily before breakfast.  . meclizine (ANTIVERT) 25 MG tablet Take 1 tablet (25 mg total) by mouth 3 (three) times daily as needed.  . metoprolol succinate (TOPROL-XL) 25 MG 24 hr tablet Take 3 tablets (75 mg total) by mouth daily.  . montelukast (SINGULAIR) 10 MG tablet Take 1 tablet (10 mg total) by mouth at bedtime.  [name] omega-3 acid ethyl esters (LOVAZA) 1 g capsule Take 1 capsule (1 g total) by mouth daily.  . ondansetron (ZOFRAN ODT) 4 MG disintegrating tablet Take 1 tablet (4 mg total) by mouth every 8 (eight) hours as needed for nausea or vomiting.  [name]  promethazine (PHENERGAN) 25 MG tablet Take 1 tablet (25 mg total) by mouth every 6 (six) hours as needed for nausea or vomiting.  [name] Propylene Glycol (SYSTANE BALANCE) 0.6 % SOLN Apply 1 drop to eye as needed.  . psyllium (METAMUCIL) 58.6 % powder Take 1 packet by mouth as needed.  [name] Spacer/Aero-Holding Chambers (AEROCHAMBER MV) inhaler Use as instructed  . Syringe/Needle, Disp, (SYRINGE 3CC/20GX1") 20G X 1" 3 ML MISC 1 Syringe by Does not apply route every 30 (thirty) days. For use with b-12 injection monthly  . tretinoin (RETIN-A) 0.05 % cream Apply topically at bedtime.  . urea (CARMOL) 10 % cream Apply topically as needed.  .  vitamin B-12 (CYANOCOBALAMIN) 1000 MCG tablet Take 1 tablet (1,000 mcg total) by mouth daily.  . [EXPIRED] cyanocobalamin ((VITAMIN B-12)) injection 1,000 mcg    No facility-administered encounter medications on file as of 05/20/2018.     Allergies (verified) Codeine; Sulfonamide derivatives; and Estradiol   History: Past Medical History:  Diagnosis Date  . ABNORMAL HEART RHYTHMS 06/09/2008  . Allergic rhinitis 06/09/2008   chronic  . Asthma, intrinsic 10/06/2011   controlled on qvar. [name] 09/2011:  Very mild airflow obstruction (FEV1% 68, FVL c/w obstruction)   . Chronic headache 06/09/2008  . COPD (chronic obstructive pulmonary disease) ([place])   . Depression   . Diverticulosis 2013   h/o diverticulitis  . Dry eyes   . GERD (gastroesophageal reflux disease)   . History of breast implant removal    silicone mastitis - R axilla silicone LN, free silicone L breast upper outer quadrant  . Hot flashes   . HYPERLIPIDEMIA 06/09/2008  . HYPERTENSION 06/09/2008  . Hypothyroidism   . MVP (mitral valve prolapse)   . OSA on CPAP    [name]  . Pernicious anemia    per prior pcp  . Rosacea   . Surgical menopause 1991  . Vertigo   . Vitamin D deficiency    Past Surgical History:  Procedure Laterality Date  . APPENDECTOMY  2007  . BREAST BIOPSY Left 09/27/2009  . BREAST ENHANCEMENT SURGERY  2006  . CARDIOVASCULAR STRESS TEST  2013   [place] [name] - WNL, EF 55-60%, with 0 calcium score  . CHOLECYSTECTOMY  2007   biliary dyskinesia  . COLONOSCOPY  08/2012   diverticulosis  . COLONOSCOPY  2006   inflamed hyperplastic polyp  . ESOPHAGOGASTRODUODENOSCOPY  08/2012   esophageal reflux  . NASAL SINUS SURGERY    . RECTOCELE REPAIR  12/2008   [name]  . sleep study  08/2007   OSA, 12 cm H2O,  . TOTAL ABDOMINAL HYSTERECTOMY  1991   heavy bleeding, ovaries removed  . TUBAL LIGATION    . US ECHOCARDIOGRAPHY  09/2009   impaired relaxation, mild tric regurg, normal MV, EF 60%  .  US ECHOCARDIOGRAPHY  11/2007   mild-mod MR   Family History  Problem Relation Age of Onset  . Cancer Mother 63       lung, passive smoke  . Cancer Father 92       lung, smoker  . Cancer Paternal Aunt        breast, lung  . Cancer Cousin        breast  . Stroke Sister        TIA  . Hypertension Sister   . Heart failure Sister   . Alzheimer's disease Paternal Aunt   . Aortic dissection Paternal Uncle   . Aortic dissection Maternal Aunt   .  Breast cancer Maternal Aunt    Social History   Socioeconomic History  . Marital status: Married    Spouse name: Not on file  . Number of children: Not on file  . Years of education: Not on file  . Highest education level: Not on file  Occupational History  . Occupation: retired  Scientific laboratory technician  . Financial resource strain: Not hard at all  . Food insecurity:    Worry: Never true    Inability: Never true  . Transportation needs:    Medical: No    Non-medical: No  Tobacco Use  . Smoking status: Former Smoker    Packs/day: 0.30    Years: 7.00    Pack years: 2.10    Types: Cigarettes    Last attempt to quit: 10/27/1973    Years since quitting: 44.5  . Smokeless tobacco: Never Used  . 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 Hx of children who are grown  Substance and Sexual Activity  . Alcohol use: Yes    Alcohol/week: 0.0 oz    Comment: very rare  . Drug use: No  . Sexual activity: Not on file  Lifestyle  . Physical activity:    Days per week: 3 days    Minutes per session: 20 min  . Stress: Not at all  Relationships  . Social connections:    Talks on phone: Not on file    Gets together: Not on file    Attends religious service: Not on file    Active member of club or organization: Not on file    Attends meetings of clubs or organizations: Not on file    Relationship status: Not on file  Other Topics Concern  . 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 Hx of children who are grown   Clinical Intake:  Pre-visit preparation completed: Yes  Pain : 0-10 Pain Score: 2  Pain Type: Acute pain(sprained ankle) Pain Orientation: Left Pain Descriptors / Indicators: Constant, Aching, Discomfort Pain Onset: In the past 7 days Pain Relieving Factors: rice, ice, compresion, elevation, boot(Followed by [facility].)  Pain Relieving Factors: rice, ice, compresion, elevation, boot(Followed by [facility].)  Nutritional Status: BMI 25 -29 Overweight Diabetes: No  How often do you need to have someone help you when you read instructions, pamphlets, or other written materials from your doctor or pharmacy?: 1 - Never  Interpreter Needed?: No      Activities of Daily Living In your present state of health, do you have any difficulty performing the following activities: 05/20/2018  Hearing? N  Vision? N  Difficulty concentrating or making decisions? N  Walking or climbing stairs? Y  Comment Sprained ankle; followed by [facility].   Dressing or bathing? N  Doing errands, shopping? N  Preparing Food and eating ? N  Using the Toilet? N  In the past six months, have you accidently leaked urine? Y  Comment Managed with a daily liner  Do you have problems with loss of bowel control? N  Managing your Medications? N  Managing your Finances? N  Housekeeping or managing your Housekeeping? N  Some recent data might be hidden     Immunizations and Health Maintenance Immunization History  Administered Date(s) Administered  . Influenza Split 07/15/2013, 08/04/2017  . Influenza Whole 07/27/2009, 07/28/2011, 07/21/2012  . Influenza, High Dose Seasonal PF 07/22/2017  . Influenza,inj,Quad PF,6+ Mos 08/03/2014, 07/12/2015, 07/14/2016  .  Influenza-Unspecified 07/15/2013, 08/03/2014, 07/12/2015, 07/14/2016, 08/04/2017  . Pneumococcal Conjugate-13 11/08/2014  . Pneumococcal Polysaccharide-23 07/27/2009, 12/27/2015  . Td 10/27/2008  . Tdap 12/19/2013, 02/25/2017  . Tetanus 10/27/2008  . Zoster 10/27/2009   There are no preventive care reminders to display for this patient.  Patient Care Team: Crecencio Mc, MD as PCP - General (Internal Medicine)  Indicate any recent Medical Services you may have received from other than [facility] providers in the past year (date may be approximate).     Assessment:   This is a routine wellness examination for Angela Mendoza. The goal of the wellness visit is to assist the patient how to close the gaps in care and create a preventative care plan for the patient.   The roster of all physicians providing medical care to patient is listed in the Snapshot section of the chart.  Taking calcium VIT D as appropriate/Osteoporosis risk reviewed.    Safety issues reviewed; Lives with husband who she states has early stages of dementia. Care giver educational material provided upon her request. Smoke and carbon monoxide detectors in the home. No firearms in the home. Wears seatbelts when driving or riding with others. No violence in the home.  They do not have excessive sun exposure.  Discussed the need for sun protection: hats, long sleeves and the use of sunscreen if there is significant sun exposure.  Patient is alert, normal appearance, oriented to person/place/and time. Correctly identified the president of the [place] and recalls of 3/3 words.Performs simple calculations and can read correct time from watch face. Displays appropriate judgement.  No new identified risk were noted.  No failures at ADL's or IADL's.    BMI- discussed the importance of a healthy diet, water intake and the benefits of aerobic exercise. Educational material provided.   24 hour diet recall: Regular diet  Dental- every 6  months.  Sleep patterns- Sleeps 6-8 hours at night.  Wakes feeling rested. CPAP in use.  B12 injection administered L deltoid per physician order. Voiced no concerns, showed no signs of distress during injection.  3/3 complete. She requests follow up lab; order placed. Appointment scheduled next week.   Health maintenance gaps- closed.  Patient Concerns: She would like a referral placed to the original gynecologist group for the prolapsed bladder. States pcp picked the right one the first time.   Hearing/Vision screen Hearing Screening Comments: Patient is able to hear conversational tones without difficulty.  No issues reported.  Vision Screening Comments: Followed by [facility], Dr. Noel Gerold) Wears corrective lenses Last OV 08/2017 Dry eye; drops in use Visual acuity not assessed per patient preference since they have regular follow up with the ophthalmologist  Dietary issues and exercise activities discussed: Current Exercise Habits: Home exercise routine, Time (Minutes): 20, Frequency (Times/Week): 3, Weekly Exercise (Minutes/Week): 60, Intensity: Mild  Goals    . Increase physical activity     Core strengthening exercises for balance. Educational material provided.     . Low carb diet     Portion control      Depression Screen PHQ 2/9 Scores 05/20/2018 05/14/2017 06/23/2016  PHQ - 2 Score 0 0 0    Fall Risk Fall Risk  05/20/2018 05/14/2017 06/23/2016  Falls in the past year? Yes Yes No  Number falls  in past yr: 2 or more 1 -  Comment Followed by [facility] - -  Injury with Fall? Yes Yes -  Comment Sprained her ankle stepping the wrong way.  - -  Follow up Education provided;Falls prevention discussed Falls evaluation completed -    Cognitive Function: MMSE - Mini Mental State Exam 05/20/2018  Orientation to time 5  Orientation to Place 5  Registration 3  Attention/ Calculation 5  Recall 3  Language- name 2 objects 2  Language- repeat 1    Language- follow 3 step command 3  Language- read & follow direction 1  Write a sentence 1  Copy design 1  Total score 30        Screening Tests Health Maintenance  Topic Date Due  . INFLUENZA VACCINE  05/27/2018  . MAMMOGRAM  04/01/2020  . COLONOSCOPY  09/02/2022  . TETANUS/TDAP  02/26/2027  . DEXA SCAN  Completed  . Hepatitis C Screening  Completed  . PNA vac Low Risk Adult  Completed     Plan:    End of life planning; Advance aging; Advanced directives discussed. Copy of current HCPOA/Living Will requested upon completion.   I have personally reviewed and noted the following in the patient's chart:   . Medical and social history . Use of alcohol, tobacco or illicit drugs  . Current medications and supplements . Functional ability and status . Nutritional status . Physical activity . Advanced directives . List of other physicians . Hospitalizations, surgeries, and ER visits in previous 12 months . Vitals . Screenings to include cognitive, depression, and falls . Referrals and appointments  In addition, I have reviewed and discussed with patient certain preventive protocols, quality metrics, and best practice recommendations. A written personalized care plan for preventive services as well as general preventive health recommendations were provided to patient.     OBrien-Blaney, Pola Furno L, LPN   04/23/3150      I have reviewed the above information and agree with above.   Deborra Medina, MD

## 2018-05-27 ENCOUNTER — Other Ambulatory Visit (INDEPENDENT_AMBULATORY_CARE_PROVIDER_SITE_OTHER): Payer: Medicare Other

## 2018-05-27 ENCOUNTER — Encounter: Payer: Self-pay | Admitting: Internal Medicine

## 2018-05-27 DIAGNOSIS — E538 Deficiency of other specified B group vitamins: Secondary | ICD-10-CM

## 2018-05-27 LAB — VITAMIN B12: Vitamin B-12: 629 pg/mL (ref 211–911)

## 2018-05-28 ENCOUNTER — Other Ambulatory Visit: Payer: Self-pay | Admitting: Internal Medicine

## 2018-05-28 MED ORDER — CYANOCOBALAMIN 1000 MCG/ML IJ SOLN: 1000.0000 ug | INTRAMUSCULAR | 3 refills | Status: DC

## 2018-05-28 MED ORDER — "SYRINGE 20G X 1"" 3 ML MISC": 1.0000 | 0 refills | Status: DC

## 2018-06-03 DIAGNOSIS — S93402A Sprain of unspecified ligament of left ankle, initial encounter: Secondary | ICD-10-CM | POA: Diagnosis not present

## 2018-06-03 DIAGNOSIS — S92215A Nondisplaced fracture of cuboid bone of left foot, initial encounter for closed fracture: Secondary | ICD-10-CM | POA: Diagnosis not present

## 2018-06-07 ENCOUNTER — Ambulatory Visit: Payer: Self-pay | Admitting: Urology

## 2018-06-08 ENCOUNTER — Telehealth: Payer: Self-pay | Admitting: Internal Medicine

## 2018-06-08 NOTE — Telephone Encounter (Signed)
Copied from [place] [phone]. Topic: Quick Communication - See Telephone Encounter >> Jun 08, 2018  4:05 PM [name], NT wrote: CRM for notification. See Telephone encounter for: 06/08/18.  Angela Mendoza is calling from [facility] by mail and states he has an order for 25 gauge syringes for the B12 injection. He states that is a big needle and they usually send the 20 gauge. He is wanting to clarify if its okay to send the 20 gauge. Please call back.  Cb# [phone]  Reference# [id number]

## 2018-06-08 NOTE — Telephone Encounter (Signed)
rx question about needles

## 2018-06-09 NOTE — Telephone Encounter (Signed)
No.  25 gauge is smaller than 20 gauge.

## 2018-06-09 NOTE — Telephone Encounter (Signed)
Is it okay to change the needles used for giving b12 to the 20 gauge verses the 25 gauge? Pharmacist stated that they usually send 20 gauge.

## 2018-06-10 NOTE — Telephone Encounter (Signed)
Left a detailed message on pharmacist line.

## 2018-06-24 ENCOUNTER — Telehealth: Payer: Self-pay | Admitting: Pulmonary Disease

## 2018-06-24 NOTE — Telephone Encounter (Signed)
Spoke with pt. She is requesting to get her flu shot. Pt would like to get the high dose flu shot.  VS - are you okay with the pt getting the high dose flu shot? Thanks.

## 2018-06-25 NOTE — Telephone Encounter (Signed)
Spoke with patient, patient given an Injection Appt for September 3rd at 11:00am. Nothing further needed at this time.

## 2018-06-25 NOTE — Telephone Encounter (Signed)
Yes

## 2018-06-29 ENCOUNTER — Ambulatory Visit (INDEPENDENT_AMBULATORY_CARE_PROVIDER_SITE_OTHER): Payer: Medicare Other

## 2018-06-29 DIAGNOSIS — Z23 Encounter for immunization: Secondary | ICD-10-CM

## 2018-07-01 DIAGNOSIS — Z23 Encounter for immunization: Secondary | ICD-10-CM | POA: Diagnosis not present

## 2018-07-02 ENCOUNTER — Ambulatory Visit (INDEPENDENT_AMBULATORY_CARE_PROVIDER_SITE_OTHER): Payer: Medicare Other | Admitting: Cardiovascular Disease

## 2018-07-02 ENCOUNTER — Encounter: Payer: Self-pay | Admitting: Cardiovascular Disease

## 2018-07-02 VITALS — BP 158/82 | HR 67 | Ht 65.0 in | Wt 181.0 lb

## 2018-07-02 DIAGNOSIS — I480 Paroxysmal atrial fibrillation: Secondary | ICD-10-CM | POA: Diagnosis not present

## 2018-07-02 DIAGNOSIS — J45909 Unspecified asthma, uncomplicated: Secondary | ICD-10-CM | POA: Diagnosis not present

## 2018-07-02 DIAGNOSIS — R002 Palpitations: Secondary | ICD-10-CM | POA: Diagnosis not present

## 2018-07-02 DIAGNOSIS — F419 Anxiety disorder, unspecified: Secondary | ICD-10-CM | POA: Diagnosis not present

## 2018-07-02 DIAGNOSIS — I10 Essential (primary) hypertension: Secondary | ICD-10-CM

## 2018-07-02 DIAGNOSIS — E785 Hyperlipidemia, unspecified: Secondary | ICD-10-CM | POA: Diagnosis not present

## 2018-07-02 DIAGNOSIS — Z8679 Personal history of other diseases of the circulatory system: Secondary | ICD-10-CM | POA: Insufficient documentation

## 2018-07-02 DIAGNOSIS — G4733 Obstructive sleep apnea (adult) (pediatric): Secondary | ICD-10-CM | POA: Diagnosis not present

## 2018-07-02 DIAGNOSIS — Z9989 Dependence on other enabling machines and devices: Secondary | ICD-10-CM | POA: Diagnosis not present

## 2018-07-02 NOTE — Progress Notes (Signed)
Cardiology Office Note  Date:  07/02/2018   ID:  [name], DOB 02-Nov-1950, MRN [id number]  PCP:  Angela Mc, MD   Chief Complaint  Patient presents with  . OTHER    Heart Palpitations. Meds reviewed verbally with pt.    HPI:  Angela Mendoza is a 67 year old woman with past medical history of Remote atrial fib Palpitations anxiety OSA, on CPAP Referred by Dr. Derrel Nip for consultation of her palpitations, HTN, atrial fib   Long discussion today, numerous concerns, anxious about her health in general Atrial fib in 2016 "jumped out of her skin" Worked in medical field Tried various med Finally worked with metoprolol to control her rate and rhythm Has been only taking aspirin  Last year BP elevated when daughter visited from [place], bipolar Tried losartan Stopped after she was afraid of recall Tried amlodipine, had swelling Now only takes metoprolol succinate 75 daily Chronic fatigue which she attributes to the metoprolol  Arm bruising, on asa 81  Taking this for microvascular disease seen on MRI  As detailed above chronic fatigue symptoms nap in afternoon most daily Poor sleep, vivid dreams, Sees Dr. Halford Chessman for asthma  Previous testing reviewed with her Echo 2000 Normal study  MRI Microvascular changes  Pressures at home: 118-127 Pulse 60s to 70 On metoprolol succinate 75  Rare sharp chest pain epsiodes Does not last Not associated with exertion  Concerned about blockages in her carotid, aortic aneurysm, heart disease  Previous issues with breast implants Reports a try to take them all out and left some in her  EKG personally reviewed by myself on todays visit Shows normal sinus rhythm rate 67 bpm no significant ST or T wave changes  PMH:   has a past medical history of ABNORMAL HEART RHYTHMS (06/09/2008), Allergic rhinitis (06/09/2008), Asthma, intrinsic (10/06/2011), Chronic headache (06/09/2008), COPD (chronic obstructive pulmonary disease)  ([place]), Depression, Diverticulosis (2013), Dry eyes, GERD (gastroesophageal reflux disease), History of breast implant removal, Hot flashes, HYPERLIPIDEMIA (06/09/2008), HYPERTENSION (06/09/2008), Hypothyroidism, MVP (mitral valve prolapse), OSA on CPAP, Pernicious anemia, Rosacea, Surgical menopause (1991), Vertigo, and Vitamin D deficiency.  PSH:    Past Surgical History:  Procedure Laterality Date  . APPENDECTOMY  2007  . BREAST BIOPSY Left 09/27/2009  . BREAST ENHANCEMENT SURGERY  2006  . CARDIOVASCULAR STRESS TEST  2013   [place] Dr. Bettina Mendoza - WNL, EF 55-60%, with 0 calcium score  . CHOLECYSTECTOMY  2007   biliary dyskinesia  . COLONOSCOPY  08/2012   diverticulosis  . COLONOSCOPY  2006   inflamed hyperplastic polyp  . ESOPHAGOGASTRODUODENOSCOPY  08/2012   esophageal reflux  . HERNIA REPAIR    . NASAL SINUS SURGERY    . RECTOCELE REPAIR  12/2008   [name]  . sleep study  08/2007   OSA, 12 cm H2O,  . TOTAL ABDOMINAL HYSTERECTOMY  1991   heavy bleeding, ovaries removed  . TUBAL LIGATION    . US ECHOCARDIOGRAPHY  09/2009   impaired relaxation, mild tric regurg, normal MV, EF 60%  . US ECHOCARDIOGRAPHY  11/2007   mild-mod MR    Current Outpatient Medications  Medication Sig Dispense Refill  . albuterol (PROVENTIL HFA) 108 (90 Base) MCG/ACT inhaler Inhale 2 puffs into the lungs every 6 (six) hours as needed. 3 Inhaler 3  . ALPRAZolam (XANAX) 0.25 MG tablet TAKE 1 TABLET (0.25 MG TOTAL) BY MOUTH ONCE DAILY AS NEEDED FOR SLEEP. 30 tablet 2  . aspirin 81 MG tablet Take 81 mg  by mouth daily.      [name] atorvastatin (LIPITOR) 40 MG tablet Take 1 tablet (40 mg total) by mouth daily. 90 tablet 3  . Azelastine-Fluticasone (DYMISTA) 137-50 MCG/ACT SUSP Place 1 spray into the nose 2 (two) times daily. 3 Bottle 3  . B Complex Vitamins (VITAMIN-B COMPLEX) TABS Take 1 tablet by mouth daily.    . beclomethasone (QVAR REDIHALER) 80 MCG/ACT inhaler Inhale 2 puffs into the lungs 2 (two) times daily.  3 Inhaler 3  . Cholecalciferol (VITAMIN D3) 2000 units TABS Take 1 tablet by mouth daily. 90 tablet 3  . conjugated estrogens (PREMARIN) vaginal cream Place 1 Applicatorful vaginally 2 (two) times a week. 90 day supply,  Refill for one year 90 g 3  . cyanocobalamin (,VITAMIN B-12,) 1000 MCG/ML injection Inject 1 mL (1,000 mcg total) into the muscle every 30 (thirty) days. 3 mL 3  . cycloSPORINE (RESTASIS) 0.05 % ophthalmic emulsion Place 1 drop into both eyes 2 (two) times daily. 3 each 3  . diazepam (VALIUM) 5 MG tablet TAKE 1 TABLET BY MOUTH EVERY 6 HOURS AS NEEDED FOR VERTIGO 30 tablet 2  . Docusate Calcium (STOOL SOFTENER PO) Take 1 tablet by mouth as needed.    [name] estradiol (VIVELLE-DOT) 0.1 MG/24HR patch Place 1 patch (0.1 mg total) onto the skin 2 (two) times a week. 24 patch 3  . famotidine (PEPCID) 20 MG tablet Take 1 tablet (20 mg total) by mouth daily as needed for heartburn or indigestion. 90 tablet 3  . fexofenadine (ALLEGRA ALLERGY) 180 MG tablet Take 1 tablet (180 mg total) by mouth daily. 90 tablet 3  . levothyroxine (SYNTHROID) 50 MCG tablet Take 1 tablet (50 mcg total) by mouth daily before breakfast. 90 tablet 3  . meclizine (ANTIVERT) 25 MG tablet Take 1 tablet (25 mg total) by mouth 3 (three) times daily as needed. 270 tablet 3  . metoprolol succinate (TOPROL-XL) 25 MG 24 hr tablet Take 3 tablets (75 mg total) by mouth daily. 270 tablet 3  . montelukast (SINGULAIR) 10 MG tablet Take 1 tablet (10 mg total) by mouth at bedtime. 90 tablet 3  . omega-3 acid ethyl esters (LOVAZA) 1 g capsule Take 1 capsule (1 g total) by mouth daily. 90 capsule 3  . ondansetron (ZOFRAN ODT) 4 MG disintegrating tablet Take 1 tablet (4 mg total) by mouth every 8 (eight) hours as needed for nausea or vomiting. 20 tablet 0  . promethazine (PHENERGAN) 25 MG tablet Take 1 tablet (25 mg total) by mouth every 6 (six) hours as needed for nausea or vomiting. 90 tablet 3  . Propylene Glycol (SYSTANE BALANCE) 0.6  % SOLN Apply 1 drop to eye as needed.    . psyllium (METAMUCIL) 58.6 % powder Take 1 packet by mouth as needed.    [name] Spacer/Aero-Holding Chambers (AEROCHAMBER MV) inhaler Use as instructed 1 each 3  . Syringe/Needle, Disp, (SYRINGE 3CC/20GX1") 20G X 1" 3 ML MISC 1 Syringe by Does not apply route every 30 (thirty) days. For use with b-12 injection monthly 50 each 0  . tretinoin (RETIN-A) 0.05 % cream Apply topically at bedtime. 45 g 2  . urea (CARMOL) 10 % cream Apply topically as needed. 90 g 3   No current facility-administered medications for this visit.      Allergies:   Codeine; Sulfonamide derivatives; and Estradiol   Social History:  The patient  reports that she quit smoking about 44 years ago. Her smoking use included cigarettes. She  has a 2.10 pack-year smoking history. She has never used smokeless tobacco. She reports that she drinks alcohol. She reports that she does not use drugs.   Family History:   family history includes AAA (abdominal aortic aneurysm) in her paternal uncle; Aortic dissection in her maternal aunt and paternal uncle; Breast cancer in her maternal aunt; Cancer in her cousin and paternal aunt; Cancer (age of onset: 110) in her father; Cancer (age of onset: 22) in her mother; Heart attack in her paternal uncle; Heart disease in her paternal uncle; Heart failure in her sister; Hypertension in her sister; Stroke in her paternal uncle and sister.    Review of Systems: Review of Systems  Constitutional: Negative.   Respiratory: Negative.   Cardiovascular: Negative.   Gastrointestinal: Negative.   Musculoskeletal: Negative.   Neurological: Negative.   Psychiatric/Behavioral: The patient is nervous/anxious.   All other systems reviewed and are negative.    PHYSICAL EXAM: VS:  BP (!) 158/82 (BP Location: Right Arm, Patient Position: Sitting, Cuff Size: Normal)   Pulse 67   Ht 5\' 5"  (1.651 m)   Wt 181 lb (82.1 kg)   BMI 30.12 kg/m  , BMI Body mass index is  30.12 kg/m. Constitutional:  oriented to person, place, and time. No distress.  HENT:  Head: Normocephalic and atraumatic.  Eyes:  no discharge. No scleral icterus.  Neck: Normal range of motion. Neck supple. No JVD present.  Cardiovascular: Normal rate, regular rhythm, normal heart sounds and intact distal pulses. Exam reveals no gallop and no friction rub. No edema No murmur heard. Pulmonary/Chest: Effort normal and breath sounds normal. No stridor. No respiratory distress.  no wheezes.  no rales.  no tenderness.  Abdominal: Soft.  no distension.  no tenderness.  Musculoskeletal: Normal range of motion.  no  tenderness or deformity.  Neurological:  normal muscle tone. Coordination normal. No atrophy Skin: Skin is warm and dry. No rash noted. not diaphoretic.  Psychiatric:  normal mood and affect. behavior is normal. Thought content normal.    Recent Labs: 03/17/2018: TSH 2.26 04/28/2018: ALT 27; BUN 11; Creatinine, Ser 0.75; Hemoglobin 13.5; Platelets 269; Potassium 4.0; Sodium 137    Lipid Panel Lab Results  Component Value Date   CHOL 162 04/28/2018   HDL 57.20 04/28/2018   LDLCALC 84 04/28/2018   TRIG 103.0 04/28/2018      Wt Readings from Last 3 Encounters:  07/02/18 181 lb (82.1 kg)  05/20/18 178 lb 1.9 oz (80.8 kg)  04/28/18 174 lb 6.4 oz (79.1 kg)       ASSESSMENT AND PLAN:  Paroxysmal atrial fibrillation (HCC) Details unclear, presented several years ago at outside facility By her report was tried on several different medications Reports that she has been maintained on metoprolol with no recurrent symptoms Not on anticoagulation Risk and benefit of anticoagulation discussed with her  Palpitations -  Rare short palpitations likely secondary to anxiety, poor sleep If symptoms get worse, monitor could be ordered  Hyperlipidemia, unspecified hyperlipidemia type - Plan: CT CARDIAC SCORING CT coronary calcium score ordered for risk stratification at her  request To prior CT scans over the past 10 years both detailing no significant coronary calcification Low likelihood of having coronary calcification if none seen previously but will order at her request  Anxiety Likely major contributor to her symptoms Problems with her son getting divorce, with bipolar Affecting her sleep and general health  OSA on CPAP Reports she is compliant with her CPAP Still does  not sleep well likely secondary to above  Uncomplicated asthma, unspecified asthma severity, unspecified whether persistent Managed by Dr. Llana Aliment  Reports symptoms have been stable  Hypertension She is troubled by the fatigue from metoprolol succinate She could try to decrease the dose down to metoprolol succinate 50, and take extra 25 as needed for breakthrough palpitations Could retry losartan for blood pressure if needed She will call if she would like any changes  Disposition:   F/U as needed   Total encounter time more than 60 minutes  Greater than 50% was spent in counseling and coordination of care with the patient      Orders Placed This Encounter  Procedures  . CT CARDIAC SCORING  . EKG 12-Lead     Signed, Esmond Plants, M.D., Ph.D. 07/02/2018  [place], [place]

## 2018-07-02 NOTE — Patient Instructions (Addendum)
Medication Instructions:   Consider cutting back on asa   Labwork:  No new labs needed  Testing/Procedures:  We will order a CT coronary calcium score Family hx  [place] ([place]) [place]  [phone]    Follow-Up: It was a pleasure seeing you in the office today. Please call us if you have new issues that need to be addressed before your next appt.  [phone]  Your physician wants you to follow-up in:  As needed  If you need a refill on your cardiac medications before your next appointment, please call your pharmacy.  For educational health videos Log in to : [email or web address] Or : [email or web address], password : [email or web address]

## 2018-07-09 ENCOUNTER — Telehealth: Payer: Self-pay | Admitting: Cardiovascular Disease

## 2018-07-09 NOTE — Telephone Encounter (Signed)
Patient calling to see if Rockey Situ can order the Abdominal aorta ($20)  to be done at the same time as CT calcium score   Please call as patient is ready to do this

## 2018-07-09 NOTE — Telephone Encounter (Signed)
Patient states that when she was here provider mentioned she could have scans for roughly $20.00. Reviewed testing that was listed in physician note which was CT Cardiac Scoring which is offered for $150.00 at our [facility] office. Spoke with patient and she reports that her blood pressures have been elevated persistently and that there was some mention that she may need to restart her previous losartan. She reports blood pressures up to 383'A systolic and that she would like to see if she should start that back. She did not have any BP readings available but at last doctor appointment it was 158/82. I will forward to provider for review.

## 2018-07-11 NOTE — Telephone Encounter (Signed)
CT coronary $150 U/s of either ABD aorta or carotid, I think it is $50

## 2018-07-12 ENCOUNTER — Ambulatory Visit (INDEPENDENT_AMBULATORY_CARE_PROVIDER_SITE_OTHER)
Admission: RE | Admit: 2018-07-12 | Discharge: 2018-07-12 | Disposition: A | Discharge status: Home / Self Care | Payer: Self-pay | Source: Ambulatory Visit | Attending: Cardiovascular Disease | Admitting: Cardiovascular Disease

## 2018-07-12 DIAGNOSIS — R002 Palpitations: Secondary | ICD-10-CM

## 2018-07-12 DIAGNOSIS — E785 Hyperlipidemia, unspecified: Secondary | ICD-10-CM

## 2018-07-12 NOTE — Telephone Encounter (Signed)
Try losartan 50 and metoprolol 50 daily If pressure run low, could decrease the metoprolol to 25 daily

## 2018-07-13 MED ORDER — LOSARTAN POTASSIUM 50 MG PO TABS: 50.0000 mg | ORAL_TABLET | Freq: Every day | ORAL | 3 refills | Status: DC

## 2018-07-13 MED ORDER — LOSARTAN POTASSIUM 50 MG PO TABS: 50.0000 mg | ORAL_TABLET | Freq: Every day | ORAL | 1 refills | Status: DC

## 2018-07-13 NOTE — Telephone Encounter (Signed)
Spoke with patient and reviewed recommendations by provider. Instructed her to decrease her metoprolol to 50 mg once daily and then start on the losartan 50 mg once daily as well. Advised her to monitor blood pressures closely and to please call if they run too low and we can make further adjustments. She verbalized understanding of our conversation, agreement with plan, and had no further questions at this time.

## 2018-08-06 ENCOUNTER — Telehealth: Payer: Self-pay | Admitting: Pulmonary Disease

## 2018-08-06 NOTE — Telephone Encounter (Signed)
I called and spoke with Corene Cornea with [employer]. He stated that because of the Medicare Audit on several accounts they can't give out any supplies because Medicare will not pay for them. He looked at Mrs. Angela Mendoza's account and didn't see any notes as to what the hold up was on her account. He is sending a message to upper management to find out what we can do to get things moving along for the patient to get her supplies. He is suppose to call me back

## 2018-08-10 ENCOUNTER — Ambulatory Visit (INDEPENDENT_AMBULATORY_CARE_PROVIDER_SITE_OTHER): Payer: Medicare Other | Admitting: Family Medicine

## 2018-08-10 ENCOUNTER — Encounter: Payer: Self-pay | Admitting: Family Medicine

## 2018-08-10 ENCOUNTER — Encounter

## 2018-08-10 VITALS — BP 118/78 | HR 72 | Temp 98.4°F | Ht 65.0 in | Wt 181.4 lb

## 2018-08-10 DIAGNOSIS — H353190 Nonexudative age-related macular degeneration, unspecified eye, stage unspecified: Secondary | ICD-10-CM

## 2018-08-10 DIAGNOSIS — N993 Prolapse of vaginal vault after hysterectomy: Secondary | ICD-10-CM | POA: Diagnosis not present

## 2018-08-10 DIAGNOSIS — N3000 Acute cystitis without hematuria: Secondary | ICD-10-CM | POA: Diagnosis not present

## 2018-08-10 DIAGNOSIS — R300 Dysuria: Secondary | ICD-10-CM

## 2018-08-10 DIAGNOSIS — H6593 Unspecified nonsuppurative otitis media, bilateral: Secondary | ICD-10-CM

## 2018-08-10 LAB — POCT URINALYSIS DIPSTICK
Bilirubin, UA: NEGATIVE
Blood, UA: NEGATIVE
Glucose, UA: NEGATIVE
Ketones, UA: NEGATIVE
Leukocytes, UA: NEGATIVE
Nitrite, UA: NEGATIVE
Protein, UA: NEGATIVE
Spec Grav, UA: 1.01 (ref 1.010–1.025)
Urobilinogen, UA: 0.2 E.U./dL
pH, UA: 6 (ref 5.0–8.0)

## 2018-08-10 NOTE — Patient Instructions (Signed)
Atrophic Vaginitis Atrophic vaginitis is when the tissues that line the vagina become dry and thin. This is caused by a drop in estrogen. Estrogen helps:  To keep the vagina moist.  To make a clear fluid that helps: ? To lubricate the vagina for sex. ? To protect the vagina from infection.  If the lining of the vagina is dry and thin, it may:  Make sex painful. It may also cause bleeding.  Cause a feeling of: ? Burning. ? Irritation. ? Itchiness.  Make an exam of your vagina painful. It may also cause bleeding.  Make you lose interest in sex.  Cause a burning feeling when you pee.  Make your vaginal fluid (discharge) brown or yellow.  For some women, there are no symptoms. This condition is most common in women who do not get their regular menstrual periods anymore (menopause). This often starts when a woman is 45-55 years old. Follow these instructions at home:  Take medicines only as told by your doctor. Do not use any herbal or alternative medicines unless your doctor says it is okay.  Use over-the-counter products for dryness only as told by your doctor. These include: ? Creams. ? Lubricants. ? Moisturizers.  Do not douche.  Do not use products that can make your vagina dry. These include: ? Scented feminine sprays. ? Scented tampons. ? Scented soaps.  If it hurts to have sex, tell your sexual partner. Contact a doctor if:  Your discharge looks different than normal.  Your vagina has an unusual smell.  You have new symptoms.  Your symptoms do not get better with treatment.  Your symptoms get worse. This information is not intended to replace advice given to you by your health care provider. Make sure you discuss any questions you have with your health care provider. Document Released: 03/31/2008 Document Revised: 03/20/2016 Document Reviewed: 10/04/2014 Elsevier Interactive Patient Education  2018 Elsevier Inc.  

## 2018-08-10 NOTE — Telephone Encounter (Signed)
Per Corene Cornea they will be sending over the SMN for Dr. Halford Chessman to sign today

## 2018-08-10 NOTE — Telephone Encounter (Signed)
Sorry I closed this encounter by mistake. I now have the form for Dr. Halford Chessman to sign but he is not in the office until Monday 08/16/18

## 2018-08-10 NOTE — Progress Notes (Signed)
Subjective:    Patient ID: Angela Mendoza, female    DOB: 04/08/1951, 67 y.o.   MRN: [id number]  HPI   Presents to clinic c/o stuffy ears and burning with urination for 2 days.  Patient states she has not noticed any increased urinary frequency, but every time she goes to the restroom it does burn slightly.  Patient has a history of vaginal dryness & vaginal vault prolapse, but often will forget her second weekly dose of Premarin cream.  Patient states her husband has gone out of town for a month, and they did have sexual intercourse before he left.  Patient states after having sexual intercourse, did feel some irritation in vaginal area.  Ears also feel full stocking for the past couple of days.  Patient does take Singulair every day, but only takes Allegra on occasion.  Patient has a mild dry cough, no phlegm.  Patient would like lungs assessed due to history of asthma.  Patient Active Problem List   Diagnosis Date Noted  . History of atrial fibrillation 07/02/2018  . Paroxysmal atrial fibrillation ([place]) 07/02/2018  . Uncomplicated asthma 40/98/1191  . B12 deficiency 05/02/2018  . Vaginal vault prolapse after hysterectomy 05/01/2018  . Fatigue 05/01/2018  . Arthritis pain, hand 08/27/2017  . Trigger finger, right ring finger 06/09/2017  . Reaction to Pneumovax immunization 12/30/2015  . Cerebral microvascular disease 10/12/2015  . Constipation 04/14/2015  . Allergic urticaria 11/18/2014  . History of diverticulitis 11/11/2014  . Family history of colonic polyps 11/11/2014  . S/P cholecystectomy 11/11/2014  . Upper airway cough syndrome 10/27/2014  . Palpitations 08/17/2014  . Cystitis 06/15/2014  . Hyperkeratosis of sole 12/19/2013  . Oral candidiasis 12/19/2013  . Anxiety   . GERD (gastroesophageal reflux disease)   . Hypothyroidism   . Vertigo   . Menopausal syndrome   . Dry eyes   . Asthma, intrinsic 10/06/2011  . Hyperlipemia 06/09/2008  . OSA on CPAP 06/09/2008  .  Essential hypertension, benign 06/09/2008  . Seasonal and perennial allergic rhinitis 06/09/2008  . Chronic headache 06/09/2008  . ANGINA, HX OF 06/09/2008   Social History   Tobacco Use  . Smoking status: Former Smoker    Packs/day: 0.30    Years: 7.00    Pack years: 2.10    Types: Cigarettes    Last attempt to quit: 10/27/1973    Years since quitting: 44.8  . Smokeless tobacco: Never Used  . 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 Hx of children who are grown  Substance Use Topics  . Alcohol use: Yes    Alcohol/week: 0.0 standard drinks    Comment: very rare   Review of Systems   Constitutional: Negative for chills, fatigue and fever.  HENT: +congestion, ear pain/stuffiness. No sinus pain and sore throat.   Eyes: Negative.   Respiratory: Negative for cough, shortness of breath and wheezing.   Cardiovascular: Negative for chest pain, palpitations and leg swelling.  Gastrointestinal: Negative for abdominal pain, diarrhea, nausea and vomiting.  Genitourinary: +frequency and urgency.  Musculoskeletal: Negative for arthralgias and myalgias.  Skin: Negative for color change, pallor and rash.  Neurological: Negative for syncope, light-headedness and headaches.  Psychiatric/Behavioral: The patient is not nervous/anxious.       Objective:   Physical Exam  Constitutional: She is oriented to person, place, and time. No distress.  HENT:  Head: Normocephalic and atraumatic.  Right Ear: External ear normal.  Left Ear: External ear normal.  Mouth/Throat:  Oropharynx is clear and moist. No oropharyngeal exudate.  +fullness bilat TMs with small mid ear effusion. TMs are not red or bulging out  Eyes: Conjunctivae and EOM are normal. Right eye exhibits no discharge. Left eye exhibits no discharge. No scleral icterus.  Neck: Neck supple. No tracheal deviation present.  Cardiovascular: Normal rate and regular rhythm.  Pulmonary/Chest: Effort normal and  breath sounds normal. No respiratory distress.  Abdominal: Soft. Bowel sounds are normal. There is no tenderness.  Genitourinary:  Genitourinary Comments: Pelvic exam declined in clinic today.   Lymphadenopathy:    She has no cervical adenopathy.  Neurological: She is alert and oriented to person, place, and time.  Skin: Skin is warm and dry. No pallor.  Psychiatric: She has a normal mood and affect. Her behavior is normal.  Nursing note and vitals reviewed.     Vitals:   08/10/18 1022  BP: 118/78  Pulse: 72  Temp: 98.4 F (36.9 C)  SpO2: 98%   Assessment & Plan:    A total of 25 minutes were spent face-to-face with the patient during this encounter and over half of that time was spent on counseling and coordination of care. The patient was counseled on vaginal dryness & ways to help this.    Middle ear effusion - patient advised to begin taking her Allegra every day.  Also advised to do saline nasal flushes as these can help with draining congestion.  Keep up good fluid intake.  Vaginal dryness/vaginal vault prolapse/burning with urination - urinalysis is clear for any signs of infection.  We will also send for culture to see if any back.  Does grow.  I suspect some of the burning with urination is related to some micro-tears in the vaginal skin after having sexual intercourse due to vaginal dryness.  Patient advised to use a lubricant with any sexual intercourse to help with the dryness.  Be sure to take premarin 2x per week as prescribed. Patient also would like a new referral to urogynecology, she has seen urogynecology in the past and now feels she is a to do something to help her prolapse.  Keep regularly scheduled follow-up as planned.  Return to clinic sooner if any issues arise.

## 2018-08-10 NOTE — Telephone Encounter (Signed)
Rodena Piety please advise if you have heard back from [place] with [facility]. Thanks.

## 2018-08-12 ENCOUNTER — Telehealth: Payer: Self-pay | Admitting: Family Medicine

## 2018-08-12 LAB — URINE CULTURE
MICRO NUMBER:: 91238208
SPECIMEN QUALITY:: ADEQUATE

## 2018-08-12 MED ORDER — CIPROFLOXACIN HCL 500 MG PO TABS: 500.0000 mg | ORAL_TABLET | Freq: Two times a day (BID) | ORAL | 0 refills | Status: AC

## 2018-08-12 NOTE — Telephone Encounter (Signed)
Called Pt back no answer left message on answering machine to call office back. Will try back later

## 2018-08-12 NOTE — Telephone Encounter (Signed)
Copied from [place] [phone]. Topic: General - Inquiry >> Aug 12, 2018  1:14 PM Margot Ables wrote: Reason for CRM: pt is not able to login to her mychart and she would like a call back regarding the cipro sent in and specific culture growth.

## 2018-08-12 NOTE — Addendum Note (Signed)
Addended by: Philis Nettle on: 08/12/2018 10:43 AM   Modules accepted: Orders

## 2018-08-12 NOTE — Telephone Encounter (Signed)
Called Pt back she had questions about over the counter OZO to take for pain, Lauren Guse said that would be fine and to also finish taking her antibiotics. Pt stated she understood and had no questions

## 2018-08-12 NOTE — Telephone Encounter (Signed)
Patient returning call to [place]. Please advise.

## 2018-08-20 ENCOUNTER — Ambulatory Visit (INDEPENDENT_AMBULATORY_CARE_PROVIDER_SITE_OTHER): Payer: Medicare Other | Admitting: Family Medicine

## 2018-08-20 ENCOUNTER — Encounter: Payer: Self-pay | Admitting: Family Medicine

## 2018-08-20 VITALS — BP 136/92 | HR 73 | Temp 98.2°F | Ht 65.0 in | Wt 180.0 lb

## 2018-08-20 DIAGNOSIS — N3000 Acute cystitis without hematuria: Secondary | ICD-10-CM

## 2018-08-20 DIAGNOSIS — H353190 Nonexudative age-related macular degeneration, unspecified eye, stage unspecified: Secondary | ICD-10-CM

## 2018-08-20 DIAGNOSIS — I10 Essential (primary) hypertension: Secondary | ICD-10-CM

## 2018-08-20 DIAGNOSIS — R300 Dysuria: Secondary | ICD-10-CM | POA: Diagnosis not present

## 2018-08-20 LAB — POCT URINALYSIS DIPSTICK
Bilirubin, UA: NEGATIVE
Blood, UA: NEGATIVE
Glucose, UA: NEGATIVE
Ketones, UA: NEGATIVE
Leukocytes, UA: NEGATIVE
Nitrite, UA: NEGATIVE
Protein, UA: NEGATIVE
Spec Grav, UA: 1.01 (ref 1.010–1.025)
Urobilinogen, UA: 0.2 E.U./dL
pH, UA: 6.5 (ref 5.0–8.0)

## 2018-08-20 MED ORDER — CEFTRIAXONE SODIUM 1 G IJ SOLR
1.0000 g | Freq: Once | INTRAMUSCULAR | Status: AC
  Administered 2018-08-20: 1 g via INTRAMUSCULAR

## 2018-08-20 MED ORDER — METOPROLOL SUCCINATE ER 25 MG PO TB24: 50.0000 mg | ORAL_TABLET | Freq: Every day | ORAL | Status: DC

## 2018-08-20 NOTE — Patient Instructions (Signed)
Great to see you again!

## 2018-08-20 NOTE — Progress Notes (Signed)
Subjective:    Patient ID: Angela Mendoza, female    DOB: 1951/06/18, 67 y.o.   MRN: [id number]  HPI   Patient presents to clinic due to continued burning with urination, states it is improved but still feels some slight burning.  Patient's urinalysis dipstick at last visit was not remarkable for anything in clinic however her urine culture did grow bacteria so she was treated with Cipro twice daily for 5 days.  Patient also has had chronic issues with vaginal dryness and is currently using Premarin cream twice a week.  Patient was advised at last visit to begin using lubricant with any sexual intercourse, and she states this has been going well.  She also was given a new urogynecology referral at last visit, and states she has upcoming appointment with them in December 2019.  Denies fever chills denies nausea, vomiting or diarrhea.  Patient reports she has been taking 50mg  of toprol for her BP, wants this corrected on med list.   Patient Active Problem List   Diagnosis Date Noted  . Nonexudative age-related macular degeneration 08/10/2018  . History of atrial fibrillation 07/02/2018  . Paroxysmal atrial fibrillation ([place]) 07/02/2018  . Uncomplicated asthma 93/79/0240  . B12 deficiency 05/02/2018  . Vaginal vault prolapse after hysterectomy 05/01/2018  . Fatigue 05/01/2018  . Arthritis pain, hand 08/27/2017  . Trigger finger, right ring finger 06/09/2017  . Reaction to Pneumovax immunization 12/30/2015  . Cerebral microvascular disease 10/12/2015  . Constipation 04/14/2015  . Allergic urticaria 11/18/2014  . History of diverticulitis 11/11/2014  . Family history of colonic polyps 11/11/2014  . S/P cholecystectomy 11/11/2014  . Upper airway cough syndrome 10/27/2014  . Palpitations 08/17/2014  . Cystitis 06/15/2014  . Hyperkeratosis of sole 12/19/2013  . Oral candidiasis 12/19/2013  . Anxiety   . GERD (gastroesophageal reflux disease)   . Hypothyroidism   . Vertigo   .  Menopausal syndrome   . Dry eyes   . Asthma, intrinsic 10/06/2011  . Hyperlipemia 06/09/2008  . OSA on CPAP 06/09/2008  . Essential hypertension, benign 06/09/2008  . Seasonal and perennial allergic rhinitis 06/09/2008  . Chronic headache 06/09/2008  . ANGINA, HX OF 06/09/2008   Social History   Tobacco Use  . Smoking status: Former Smoker    Packs/day: 0.30    Years: 7.00    Pack years: 2.10    Types: Cigarettes    Last attempt to quit: 10/27/1973    Years since quitting: 44.8  . Smokeless tobacco: Never Used  . 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 Hx of children who are grown  Substance Use Topics  . Alcohol use: Yes    Alcohol/week: 0.0 standard drinks    Comment: very rare   Review of Systems  Constitutional: Negative for chills, fatigue and fever.  HENT: Negative for congestion, ear pain, sinus pain and sore throat.   Eyes: Negative.   Respiratory: Negative for cough, shortness of breath and wheezing.   Cardiovascular: Negative for chest pain, palpitations and leg swelling.  Gastrointestinal: Negative for abdominal pain, diarrhea, nausea and vomiting.  Genitourinary: +dysuria, somewhat improved. +vaginal dryness. Musculoskeletal: Negative for arthralgias and myalgias.  Skin: Negative for color change, pallor and rash.  Neurological: Negative for syncope, light-headedness and headaches.  Psychiatric/Behavioral: The patient is not nervous/anxious.       Objective:   Physical Exam  Constitutional: She is oriented to person, place, and time.  HENT:  Head: Normocephalic and atraumatic.  Eyes:  EOM are normal. No scleral icterus.  Neck: Neck supple. No tracheal deviation present.  Cardiovascular: Normal rate and regular rhythm.  Pulmonary/Chest: Effort normal and breath sounds normal. No respiratory distress.  Abdominal: Soft. Bowel sounds are normal. She exhibits no distension.  Mild suprapubic tenderness  Musculoskeletal: She  exhibits no edema.  Gait normal  Neurological: She is alert and oriented to person, place, and time.  Skin: Skin is warm and dry. No pallor.  Psychiatric: She has a normal mood and affect. Her behavior is normal.  Nursing note and vitals reviewed.     Vitals:   08/20/18 1130  BP: (!) 136/92  Pulse: 73  Temp: 98.2 F (36.8 C)  SpO2: 97%   Assessment & Plan:   UTI, burning with urination, vaginal dryness - patient will give 1 g IM Rocephin in clinic, ceftriaxone was sensitive on her previous urine culture so this should cover UTI bacteria patient advised to keep upcoming appointment with urogynecology and also continue to use lubricant with sexual intercourse as this has been successful.  Also will continue Premarin cream 2 times per week.  Administrations This Visit    cefTRIAXone (ROCEPHIN) injection 1 g    Admin Date 08/20/2018 Action Given Dose 1 g Route Intramuscular Administered By Neta Ehlers, RMA         Essential hypertension- patient med list show that she was taking 75 mg of metoprolol, but states she is only taking 50.  Proper medication dosage put into computer.  Keep regularly scheduled follow-up in upcoming specialty appointments as planned.  Return to clinic sooner if issues persist or worsen or if new issues arise.

## 2018-08-21 LAB — URINE CULTURE
MICRO NUMBER:: 91286301
Result:: NO GROWTH
SPECIMEN QUALITY:: ADEQUATE

## 2018-08-30 ENCOUNTER — Ambulatory Visit: Payer: Medicare Other | Admitting: Pulmonary Disease

## 2018-09-07 ENCOUNTER — Ambulatory Visit (INDEPENDENT_AMBULATORY_CARE_PROVIDER_SITE_OTHER): Payer: Medicare Other | Admitting: Nurse Practitioner

## 2018-09-07 ENCOUNTER — Encounter: Payer: Self-pay | Admitting: Nurse Practitioner

## 2018-09-07 DIAGNOSIS — G4733 Obstructive sleep apnea (adult) (pediatric): Secondary | ICD-10-CM

## 2018-09-07 DIAGNOSIS — J45909 Unspecified asthma, uncomplicated: Secondary | ICD-10-CM | POA: Diagnosis not present

## 2018-09-07 DIAGNOSIS — Z9989 Dependence on other enabling machines and devices: Secondary | ICD-10-CM

## 2018-09-07 MED ORDER — MONTELUKAST SODIUM 10 MG PO TABS: 10.0000 mg | ORAL_TABLET | Freq: Every day | ORAL | 3 refills | Status: DC

## 2018-09-07 NOTE — Progress Notes (Addendum)
@Patient  ID: Angela Mendoza, female    DOB: Jun 08, 1951, 67 y.o.   MRN: [id number]  Chief Complaint  Patient presents with  . Follow-up    six month    Referring provider: Crecencio Mc, MD  HPI  67 year old female former smoker with OSA and asthma followed by Dr. Halford Chessman.  Tests:  Sleep tests: PSG 09/06/07 >> AHI 19 CPAP 02/21/18 to 03/22/18 >> used on 30 of 30 nights with average 7 hrs 59 min.  Average AHI 0.5 with CPAP 11 cm  PFT Results Latest Ref Rng & Units 02/06/2016  FVC-Pre L 2.98  FVC-Predicted Pre % 90  FVC-Post L 3.04  FVC-Predicted Post % 92  Pre FEV1/FVC % % 74  Post FEV1/FCV % % 76  FEV1-Pre L 2.19  FEV1-Predicted Pre % 87  FEV1-Post L 2.32  DLCO UNC% % 121  DLCO COR %Predicted % 87   OV 09/08/18 - follow up Patient presents for routine follow up. She states that she is doing well. She is compliant with CPAP. She wears it nightly. States that she feels much better throughout the day and less drowsy. She denies any issues with CPAP. She denies any nightmares or sleep walking. She states that her asthma has been stable. She is compliant with Qvar, dymista, and prventil. She denies any shortness of breath, chest pain, or edema.   CPAP compliance report: 08/10/18-09/08/18 Usage days 30/30 (100%), average usage 7 hours 52 minutes, set pressure 11cmH20, AHI:0.7.   Allergies  Allergen Reactions  . Codeine     Low blood pressure/nausea  . Sulfonamide Derivatives     rash  . Estradiol Rash    Generic estradiol patch d/t the adhesive    Immunization History  Administered Date(s) Administered  . Influenza Split 07/15/2013, 08/04/2017  . Influenza Whole 07/27/2009, 07/28/2011, 07/21/2012  . Influenza, High Dose Seasonal PF 07/22/2017, 07/01/2018  . Influenza,inj,Quad PF,6+ Mos 08/03/2014, 07/12/2015, 07/14/2016  . Influenza-Unspecified 07/15/2013, 08/03/2014, 07/12/2015, 07/14/2016, 08/04/2017  . Pneumococcal Conjugate-13 11/08/2014  . Pneumococcal  Polysaccharide-23 07/27/2009, 12/27/2015  . Td 10/27/2008  . Tdap 12/19/2013, 02/25/2017  . Tetanus 10/27/2008  . Zoster 10/27/2009    Past Medical History:  Diagnosis Date  . ABNORMAL HEART RHYTHMS 06/09/2008  . Allergic rhinitis 06/09/2008   chronic  . Asthma, intrinsic 10/06/2011   controlled on qvar. [name] 09/2011:  Very mild airflow obstruction (FEV1% 68, FVL c/w obstruction)   . Chronic headache 06/09/2008  . COPD (chronic obstructive pulmonary disease) ([place])   . Depression   . Diverticulosis 2013   h/o diverticulitis  . Dry eyes   . GERD (gastroesophageal reflux disease)   . History of breast implant removal    silicone mastitis - R axilla silicone LN, free silicone L breast upper outer quadrant  . Hot flashes   . HYPERLIPIDEMIA 06/09/2008  . HYPERTENSION 06/09/2008  . Hypothyroidism   . MVP (mitral valve prolapse)   . OSA on CPAP    [name]  . Pernicious anemia    per prior pcp  . Rosacea   . Surgical menopause 1991  . Vertigo   . Vitamin D deficiency     Tobacco History: Social History   Tobacco Use  Smoking Status Former Smoker  . Packs/day: 0.30  . Years: 7.00  . Pack years: 2.10  . Types: Cigarettes  . Last attempt to quit: 10/27/1973  . 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 Hx of children who are grown   Outpatient Encounter Medications as of 09/07/2018  Medication Sig  . albuterol (PROVENTIL HFA) 108 (90 Base) MCG/ACT inhaler Inhale 2 puffs into the lungs every 6 (six) hours as needed.  . ALPRAZolam (XANAX) 0.25 MG tablet TAKE 1 TABLET (0.25 MG TOTAL) BY MOUTH ONCE DAILY AS NEEDED FOR SLEEP.  [name] aspirin 81 MG tablet Take 81 mg by mouth daily.    [name] atorvastatin (LIPITOR) 40 MG tablet Take 1 tablet (40 mg total) by mouth daily.  .  Azelastine-Fluticasone (DYMISTA) 137-50 MCG/ACT SUSP Place 1 spray into the nose 2 (two) times daily.  . B Complex Vitamins (VITAMIN-B COMPLEX) TABS Take 1 tablet by mouth daily.  . beclomethasone (QVAR REDIHALER) 80 MCG/ACT inhaler Inhale 2 puffs into the lungs 2 (two) times daily.  . Cholecalciferol (VITAMIN D3) 2000 units TABS Take 1 tablet by mouth daily.  [name] conjugated estrogens (PREMARIN) vaginal cream Place 1 Applicatorful vaginally 2 (two) times a week. 90 day supply,  Refill for one year  . cyanocobalamin (,VITAMIN B-12,) 1000 MCG/ML injection Inject 1 mL (1,000 mcg total) into the muscle every 30 (thirty) days.  . cycloSPORINE (RESTASIS) 0.05 % ophthalmic emulsion Place 1 drop into both eyes 2 (two) times daily.  . diazepam (VALIUM) 5 MG tablet TAKE 1 TABLET BY MOUTH EVERY 6 HOURS AS NEEDED FOR VERTIGO  . Docusate Calcium (STOOL SOFTENER PO) Take 1 tablet by mouth as needed.  [name] estradiol (VIVELLE-DOT) 0.1 MG/24HR patch Place 1 patch (0.1 mg total) onto the skin 2 (two) times a week.  . famotidine (PEPCID) 20 MG tablet Take 1 tablet (20 mg total) by mouth daily as needed for heartburn or indigestion.  . fexofenadine (ALLEGRA ALLERGY) 180 MG tablet Take 1 tablet (180 mg total) by mouth daily.  [name] levothyroxine (SYNTHROID) 50 MCG tablet Take 1 tablet (50 mcg total) by mouth daily before breakfast.  . losartan (COZAAR) 50 MG tablet Take 1 tablet (50 mg total) by mouth daily.  . meclizine (ANTIVERT) 25 MG tablet Take 1 tablet (25 mg total) by mouth 3 (three) times daily as needed.  . metoprolol succinate (TOPROL-XL) 25 MG 24 hr tablet Take 2 tablets (50 mg total) by mouth daily.  . montelukast (SINGULAIR) 10 MG tablet Take 1 tablet (10 mg total) by mouth at bedtime.  [name] omega-3 acid ethyl esters (LOVAZA) 1 g capsule Take 1 capsule (1 g total) by mouth daily.  . ondansetron (ZOFRAN ODT) 4 MG disintegrating tablet Take 1 tablet (4 mg total) by mouth every 8 (eight) hours as needed for nausea or  vomiting.  . promethazine (PHENERGAN) 25 MG tablet Take 1 tablet (25 mg total) by mouth every 6 (six) hours as needed for nausea or vomiting.  [name] Propylene Glycol (SYSTANE BALANCE) 0.6 % SOLN Apply 1 drop to eye as needed.  . psyllium (METAMUCIL) 58.6 % powder Take 1 packet by mouth as needed.  [name] Spacer/Aero-Holding Chambers (AEROCHAMBER MV) inhaler Use as instructed  . Syringe/Needle, Disp, (SYRINGE 3CC/20GX1") 20G X 1" 3 ML MISC 1 Syringe by Does not apply route every 30 (thirty) days. For use with b-12 injection monthly  . tretinoin (RETIN-A) 0.05 % cream Apply topically at bedtime.  . urea (CARMOL) 10 % cream Apply topically as needed.  . [DISCONTINUED] montelukast (SINGULAIR) 10 MG tablet Take 1  tablet (10 mg total) by mouth at bedtime.  . [DISCONTINUED] losartan (COZAAR) 50 MG tablet Take 1 tablet (50 mg total) by mouth daily. (Patient not taking: Reported on 09/07/2018)   No facility-administered encounter medications on file as of 09/07/2018.      Review of Systems  Review of Systems  Constitutional: Negative.  Negative for fever.  HENT: Negative.   Respiratory: Positive for shortness of breath. Negative for cough and wheezing.   Cardiovascular: Negative.  Negative for chest pain, palpitations and leg swelling.  Gastrointestinal: Negative.   Allergic/Immunologic: Negative.   Neurological: Negative.   Psychiatric/Behavioral: Negative.        Physical Exam  BP 128/70 (BP Location: Left Arm, Patient Position: Sitting, Cuff Size: Normal)   Pulse 68   Ht 5\' 5"  (1.651 m)   Wt 179 lb 9.6 oz (81.5 kg)   SpO2 98%   BMI 29.89 kg/m   Wt Readings from Last 5 Encounters:  09/07/18 179 lb 9.6 oz (81.5 kg)  08/20/18 180 lb (81.6 kg)  08/10/18 181 lb 6.4 oz (82.3 kg)  07/02/18 181 lb (82.1 kg)  05/20/18 178 lb 1.9 oz (80.8 kg)     Physical Exam  Constitutional: She is oriented to person, place, and time. She appears well-developed and well-nourished. No distress.    Cardiovascular: Normal rate and regular rhythm.  Pulmonary/Chest: Effort normal and breath sounds normal. No respiratory distress. She has no wheezes.  Musculoskeletal: She exhibits no edema.  Neurological: She is alert and oriented to person, place, and time.  Psychiatric: She has a normal mood and affect.  Nursing note and vitals reviewed.     Assessment & Plan:   Asthma, intrinsic Asthma well controlled on current regimen  Patient Instructions  Patient continues to benefit from CPAP with good compliance and control documented Continue CPAP at current settings Continue current medications Goal of 4 hours or more usage per night Maintain healthy weight Do not drive if drowsy Follow up with Dr. Halford Chessman in 9 months or sooner if needed     OSA on CPAP Patient Instructions  Patient continues to benefit from CPAP with good compliance and control documented Continue CPAP at current settings Continue current medications Goal of 4 hours or more usage per night Maintain healthy weight Do not drive if drowsy Follow up with Dr. Halford Chessman in 9 months or sooner if needed        Fenton Foy, NP 09/09/2018

## 2018-09-07 NOTE — Patient Instructions (Addendum)
Patient continues to benefit from CPAP with good compliance and control documented Continue CPAP at current settings Continue current medications Goal of 4 hours or more usage per night Maintain healthy weight Do not drive if drowsy Follow up with Dr. Halford Chessman in 9 months or sooner if needed

## 2018-09-08 ENCOUNTER — Telehealth: Payer: Self-pay

## 2018-09-08 ENCOUNTER — Encounter: Payer: Self-pay | Admitting: Nurse Practitioner

## 2018-09-08 DIAGNOSIS — H6593 Unspecified nonsuppurative otitis media, bilateral: Secondary | ICD-10-CM

## 2018-09-08 NOTE — Telephone Encounter (Signed)
Copied from [place] [id number]. Topic: Referral - Request for Referral >> Sep 08, 2018 10:37 AM Reyne Dumas L wrote: Has patient seen PCP for this complaint? yes *If NO, is insurance requiring patient see PCP for this issue before PCP can refer them? Referral for which specialty: ENT Preferred provider/office:  ENT - Dr. Malon Kindle (wants to see this doctor specifically) Reason for referral: fluid in ears.  Was seen by pulmonary yesterday and fluid still present in both ears  Pt can be reached at [phone] >> Sep 08, 2018 10:49 AM Cecelia Byars, NT wrote: Patient called  Back and unless Dr Derrel Nip prefers she goes another ENT  She will go to the one Dr Derrel Nip decides will be better able to treat the fluid in her ears

## 2018-09-08 NOTE — Assessment & Plan Note (Addendum)
Asthma well controlled on current regimen  Patient Instructions  Patient continues to benefit from CPAP with good compliance and control documented Continue CPAP at current settings Continue current medications Goal of 4 hours or more usage per night Maintain healthy weight Do not drive if drowsy Follow up with Dr. Halford Chessman in 9 months or sooner if needed

## 2018-09-08 NOTE — Assessment & Plan Note (Signed)
Patient Instructions  Patient continues to benefit from CPAP with good compliance and control documented Continue CPAP at current settings Continue current medications Goal of 4 hours or more usage per night Maintain healthy weight Do not drive if drowsy Follow up with Dr. Halford Chessman in 9 months or sooner if needed

## 2018-09-08 NOTE — Progress Notes (Signed)
Reviewed and agree with assessment/plan.   Emanuel Campos, MD [facility] 10/22/2016, 12:24 PM Pager:  [phone]  

## 2018-09-09 IMAGING — DX DG CHEST 2V
2 series · 2 of 2 positions shown · non-contrast
Comparison: Radiographs October 30, 2015.

CLINICAL DATA: Cough, shortness of breath.

EXAM:
CHEST - 2 VIEW

[chest pa]
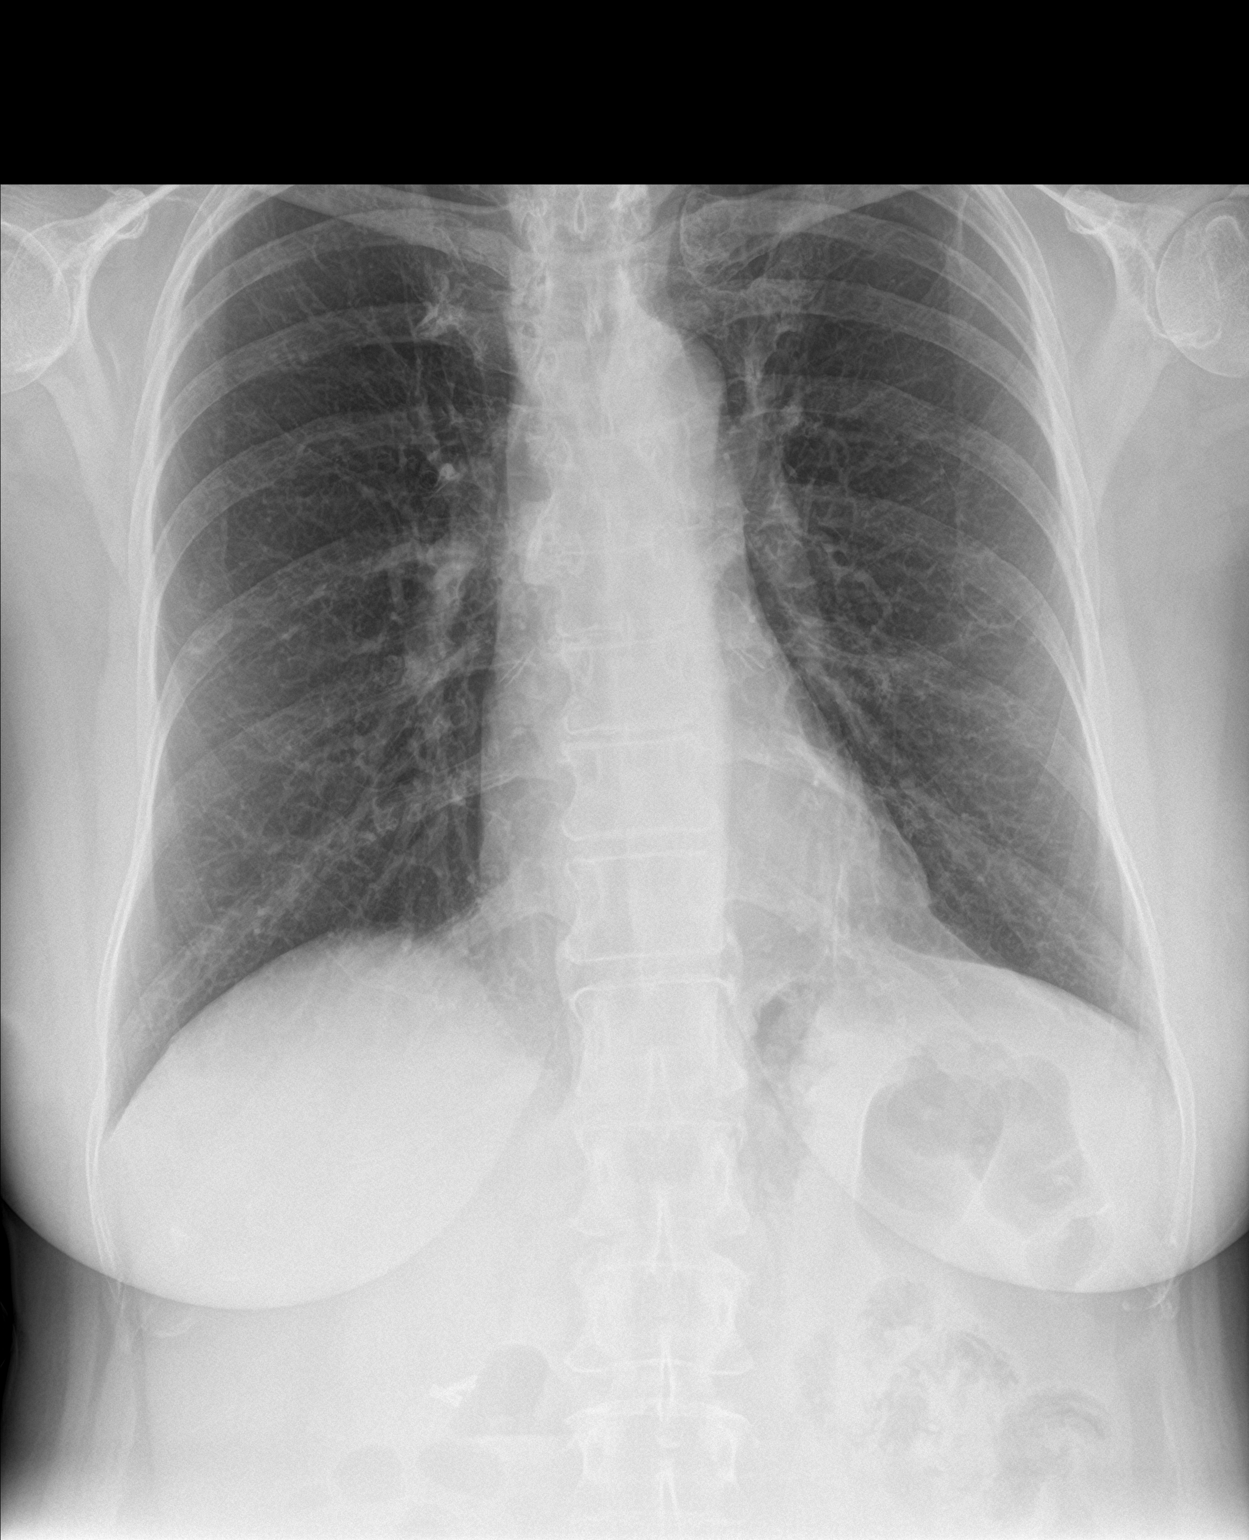

[chest lat]
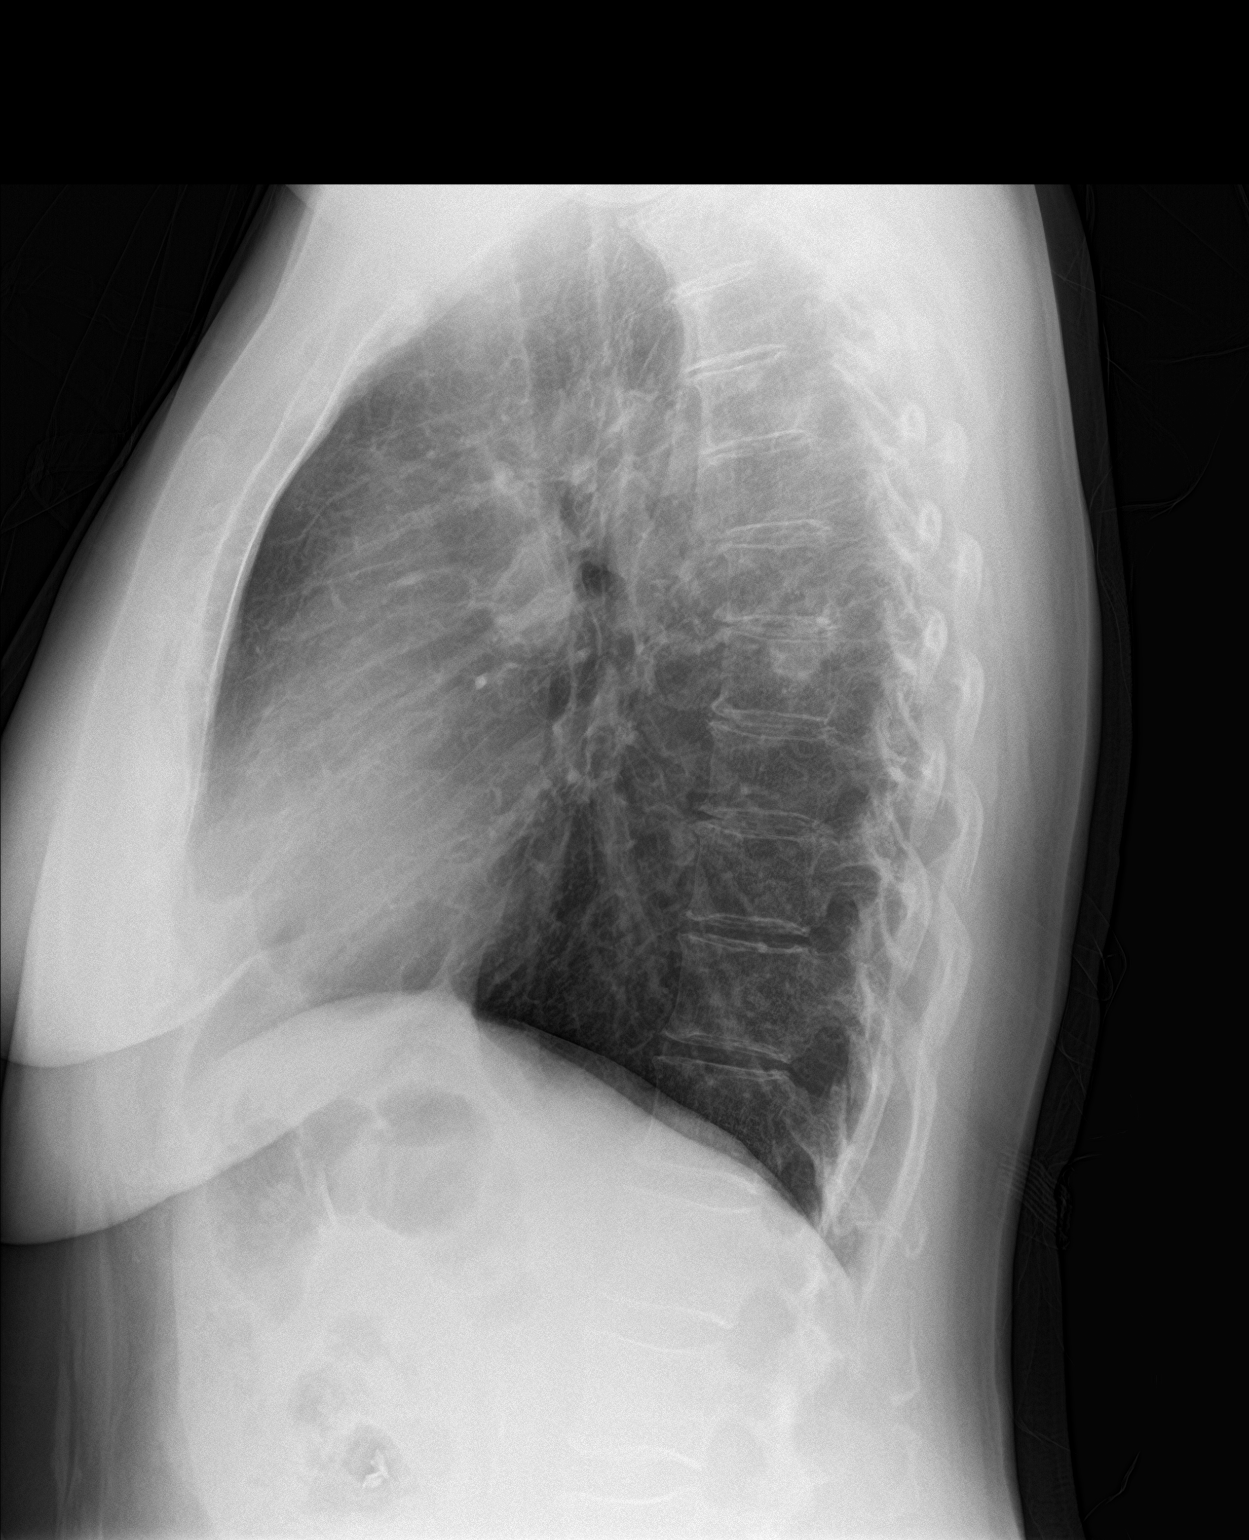

[2 of 2 positions shown; findings below may reference images not displayed]

FINDINGS: The heart size and mediastinal contours are within normal limits.
Both lungs are clear. No pneumothorax or pleural effusion is noted.
The visualized skeletal structures are unremarkable.
IMPRESSION: No active cardiopulmonary disease.

## 2018-09-09 NOTE — Telephone Encounter (Signed)
Referral made to [facility]

## 2018-09-09 NOTE — Addendum Note (Signed)
Addended by: Crecencio Mc on: 09/09/2018 09:26 AM   Modules accepted: Orders

## 2018-09-16 ENCOUNTER — Ambulatory Visit (INDEPENDENT_AMBULATORY_CARE_PROVIDER_SITE_OTHER): Payer: Medicare Other | Admitting: Internal Medicine

## 2018-09-16 ENCOUNTER — Encounter: Payer: Self-pay | Admitting: Internal Medicine

## 2018-09-16 VITALS — BP 122/62 | HR 76 | Temp 98.2°F | Ht 65.0 in | Wt 182.2 lb

## 2018-09-16 DIAGNOSIS — H6690 Otitis media, unspecified, unspecified ear: Secondary | ICD-10-CM | POA: Diagnosis not present

## 2018-09-16 MED ORDER — AMOXICILLIN-POT CLAVULANATE 875-125 MG PO TABS: 1.0000 | ORAL_TABLET | Freq: Two times a day (BID) | ORAL | 0 refills | Status: DC

## 2018-09-16 MED ORDER — CIPROFLOXACIN-DEXAMETHASONE 0.3-0.1 % OT SUSP: 4.0000 [drp] | Freq: Two times a day (BID) | OTIC | 0 refills | Status: DC

## 2018-09-16 NOTE — Progress Notes (Signed)
Chief Complaint  Patient presents with  . Ear Pain   Acute  1. Left ear pain and h/o asthma and allergies with fluid in b/l ears. She is on dymista, allegra, singular. Left ear hurting and throbbing today and sounds are muffled and distant. She has had fluid in her ears x 2 months and has appt with Dr. Tami Ribas 09/2018. She reports when given Rocephin in the past shot x 1 this helped her ear sx's x 2 weeks 2. C/o stuffy nose and cough as well.   Review of Systems  Constitutional: Negative for fever.  HENT: Positive for congestion and ear pain.   Eyes: Negative for blurred vision.  Respiratory: Positive for cough. Negative for shortness of breath and wheezing.   Cardiovascular: Negative for chest pain.  Neurological: Negative for headaches.   Past Medical History:  Diagnosis Date  . ABNORMAL HEART RHYTHMS 06/09/2008  . Allergic rhinitis 06/09/2008   chronic  . Asthma, intrinsic 10/06/2011   controlled on qvar. [name] 09/2011:  Very mild airflow obstruction (FEV1% 68, FVL c/w obstruction)   . Chronic headache 06/09/2008  . COPD (chronic obstructive pulmonary disease) ([place])   . Depression   . Diverticulosis 2013   h/o diverticulitis  . Dry eyes   . GERD (gastroesophageal reflux disease)   . History of breast implant removal    silicone mastitis - R axilla silicone LN, free silicone L breast upper outer quadrant  . Hot flashes   . HYPERLIPIDEMIA 06/09/2008  . HYPERTENSION 06/09/2008  . Hypothyroidism   . MVP (mitral valve prolapse)   . OSA on CPAP    [name]  . Pernicious anemia    per prior pcp  . Rosacea   . Surgical menopause 1991  . Vertigo   . Vitamin D deficiency    Past Surgical History:  Procedure Laterality Date  . APPENDECTOMY  2007  . BREAST BIOPSY Left 09/27/2009  . BREAST ENHANCEMENT SURGERY  2006  . CARDIOVASCULAR STRESS TEST  2013   [facility] [name] - WNL, EF 55-60%, with 0 calcium score  . CHOLECYSTECTOMY  2007   biliary dyskinesia  . COLONOSCOPY  08/2012    diverticulosis  . COLONOSCOPY  2006   inflamed hyperplastic polyp  . ESOPHAGOGASTRODUODENOSCOPY  08/2012   esophageal reflux  . HERNIA REPAIR    . NASAL SINUS SURGERY    . RECTOCELE REPAIR  12/2008   [name]  . sleep study  08/2007   OSA, 12 cm H2O,  . TOTAL ABDOMINAL HYSTERECTOMY  1991   heavy bleeding, ovaries removed  . TUBAL LIGATION    . US ECHOCARDIOGRAPHY  09/2009   impaired relaxation, mild tric regurg, normal MV, EF 60%  . US ECHOCARDIOGRAPHY  11/2007   mild-mod MR   Family History  Problem Relation Age of Onset  . Cancer Mother 58       lung, passive smoke  . Cancer Father 5       lung, smoker  . Cancer Paternal Aunt        breast, lung  . Cancer Cousin        breast  . Stroke Sister        TIA  . Hypertension Sister   . Heart failure Sister   . Aortic dissection Paternal Uncle   . Heart attack Paternal Uncle   . AAA (abdominal aortic aneurysm) Paternal Uncle   . Aortic dissection Maternal Aunt   . Breast cancer Maternal Aunt   . Heart disease  Paternal Uncle   . Stroke Paternal Uncle    Social History   Socioeconomic History  . Marital status: Married    Spouse name: Not on file  . Number of children: Not on file  . Years of education: Not on file  . Highest education level: Not on file  Occupational History  . Occupation: retired  Scientific laboratory technician  . Financial resource strain: Not hard at all  . Food insecurity:    Worry: Never true    Inability: Never true  . Transportation needs:    Medical: No    Non-medical: No  Tobacco Use  . Smoking status: Former Smoker    Packs/day: 0.30    Years: 7.00    Pack years: 2.10    Types: Cigarettes    Last attempt to quit: 10/27/1973    Years since quitting: 44.9  . Smokeless tobacco: Never Used  . 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 Hx of children who are grown  Substance and Sexual Activity  . Alcohol use: Yes    Alcohol/week: 0.0 standard drinks    Comment:  very rare  . Drug use: No  . Sexual activity: Not on file  Lifestyle  . Physical activity:    Days per week: 3 days    Minutes per session: 20 min  . Stress: Not at all  Relationships  . Social connections:    Talks on phone: Not on file    Gets together: Not on file    Attends religious service: Not on file    Active member of club or organization: Not on file    Attends meetings of clubs or organizations: Not on file    Relationship status: Not on file  . Intimate partner violence:    Fear of current or ex partner: No    Emotionally abused: No    Physically abused: No    Forced sexual activity: No  Other Topics Concern  . Not on file  Social History Narrative   Lives with husband, cat   Grown children   Occ: retired, was Gaffer   Edu: trade degree then 2 yrscollege   Current Meds  Medication Sig  . albuterol (PROVENTIL HFA) 108 (90 Base) MCG/ACT inhaler Inhale 2 puffs into the lungs every 6 (six) hours as needed.  . ALPRAZolam (XANAX) 0.25 MG tablet TAKE 1 TABLET (0.25 MG TOTAL) BY MOUTH ONCE DAILY AS NEEDED FOR SLEEP.  [name] aspirin 81 MG tablet Take 81 mg by mouth daily.    [name] atorvastatin (LIPITOR) 40 MG tablet Take 1 tablet (40 mg total) by mouth daily.  . Azelastine-Fluticasone (DYMISTA) 137-50 MCG/ACT SUSP Place 1 spray into the nose 2 (two) times daily.  . B Complex Vitamins (VITAMIN-B COMPLEX) TABS Take 1 tablet by mouth daily.  . beclomethasone (QVAR REDIHALER) 80 MCG/ACT inhaler Inhale 2 puffs into the lungs 2 (two) times daily.  . Cholecalciferol (VITAMIN D3) 2000 units TABS Take 1 tablet by mouth daily.  [name] conjugated estrogens (PREMARIN) vaginal cream Place 1 Applicatorful vaginally 2 (two) times a week. 90 day supply,  Refill for one year  . cyanocobalamin (,VITAMIN B-12,) 1000 MCG/ML injection Inject 1 mL (1,000 mcg total) into the muscle every 30 (thirty) days.  . cycloSPORINE (RESTASIS) 0.05 % ophthalmic emulsion Place 1 drop into both eyes 2 (two) times  daily.  . diazepam (VALIUM) 5 MG tablet TAKE 1 TABLET BY MOUTH EVERY 6 HOURS AS NEEDED FOR VERTIGO  .  Docusate Calcium (STOOL SOFTENER PO) Take 1 tablet by mouth as needed.  [name] estradiol (VIVELLE-DOT) 0.1 MG/24HR patch Place 1 patch (0.1 mg total) onto the skin 2 (two) times a week.  . famotidine (PEPCID) 20 MG tablet Take 1 tablet (20 mg total) by mouth daily as needed for heartburn or indigestion.  . fexofenadine (ALLEGRA ALLERGY) 180 MG tablet Take 1 tablet (180 mg total) by mouth daily.  [name] levothyroxine (SYNTHROID) 50 MCG tablet Take 1 tablet (50 mcg total) by mouth daily before breakfast.  . losartan (COZAAR) 50 MG tablet Take 1 tablet (50 mg total) by mouth daily.  . meclizine (ANTIVERT) 25 MG tablet Take 1 tablet (25 mg total) by mouth 3 (three) times daily as needed.  . metoprolol succinate (TOPROL-XL) 25 MG 24 hr tablet Take 2 tablets (50 mg total) by mouth daily.  . montelukast (SINGULAIR) 10 MG tablet Take 1 tablet (10 mg total) by mouth at bedtime.  [name] omega-3 acid ethyl esters (LOVAZA) 1 g capsule Take 1 capsule (1 g total) by mouth daily.  . ondansetron (ZOFRAN ODT) 4 MG disintegrating tablet Take 1 tablet (4 mg total) by mouth every 8 (eight) hours as needed for nausea or vomiting.  . promethazine (PHENERGAN) 25 MG tablet Take 1 tablet (25 mg total) by mouth every 6 (six) hours as needed for nausea or vomiting.  [name] Propylene Glycol (SYSTANE BALANCE) 0.6 % SOLN Apply 1 drop to eye as needed.  . psyllium (METAMUCIL) 58.6 % powder Take 1 packet by mouth as needed.  [name] Spacer/Aero-Holding Chambers (AEROCHAMBER MV) inhaler Use as instructed  . Syringe/Needle, Disp, (SYRINGE 3CC/20GX1") 20G X 1" 3 ML MISC 1 Syringe by Does not apply route every 30 (thirty) days. For use with b-12 injection monthly  . tretinoin (RETIN-A) 0.05 % cream Apply topically at bedtime.  . urea (CARMOL) 10 % cream Apply topically as needed.   Allergies  Allergen Reactions  . Codeine     Low blood pressure/nausea   . Sulfonamide Derivatives     rash  . Estradiol Rash    Generic estradiol patch d/t the adhesive   Recent Results (from the past 2160 hour(s))  Urine Culture     Status: Abnormal   Collection Time: 08/10/18 10:59 AM  Result Value Ref Range   MICRO NUMBER: [id number]    SPECIMEN QUALITY: ADEQUATE    Sample Source URINE    STATUS: FINAL    ISOLATE 1: Proteus mirabilis (A)     Comment: Greater than 100,000 CFU/mL of Proteus mirabilis      Susceptibility   Proteus mirabilis - URINE CULTURE, REFLEX    AMOX/CLAVULANIC <=2 Sensitive     AMPICILLIN <=2 Sensitive     AMPICILLIN/SULBACTAM <=2 Sensitive     CEFAZOLIN* <=4 Not Reportable      * For infections other than uncomplicated UTIcaused by E. coli, K. pneumoniae or P. mirabilis:Cefazolin is resistant if MIC > or = 8 mcg/mL.(Distinguishing susceptible versus intermediatefor isolates with MIC < or = 4 mcg/mL requiresadditional testing.)For uncomplicated UTI caused by E. coli,K. pneumoniae or P. mirabilis: Cefazolin issusceptible if MIC <32 mcg/mL and predictssusceptible to the oral agents cefaclor, cefdinir,cefpodoxime, cefprozil, cefuroxime, cephalexinand loracarbef.    CEFEPIME <=1 Sensitive     CEFTRIAXONE <=1 Sensitive     CIPROFLOXACIN <=0.25 Sensitive     LEVOFLOXACIN <=0.12 Sensitive     ERTAPENEM <=0.5 Sensitive     GENTAMICIN <=1 Sensitive     NITROFURANTOIN 128 Resistant  PIP/TAZO <=4 Sensitive     TOBRAMYCIN <=1 Sensitive     TRIMETH/SULFA* <=20 Sensitive      * For infections other than uncomplicated UTIcaused by E. coli, K. pneumoniae or P. mirabilis:Cefazolin is resistant if MIC > or = 8 mcg/mL.(Distinguishing susceptible versus intermediatefor isolates with MIC < or = 4 mcg/mL requiresadditional testing.)For uncomplicated UTI caused by E. coli,K. pneumoniae or P. mirabilis: Cefazolin issusceptible if MIC <32 mcg/mL and predictssusceptible to the oral agents cefaclor, cefdinir,cefpodoxime, cefprozil, cefuroxime,  cephalexinand loracarbef.Legend:S = Susceptible  I = IntermediateR = Resistant  NS = Not susceptible* = Not tested  NR = Not reported**NN = See antimicrobic comments  POCT Urinalysis Dipstick     Status: Abnormal   Collection Time: 08/10/18 11:13 AM  Result Value Ref Range   Color, UA yellow    Clarity, UA clear    Glucose, UA Negative Negative   Bilirubin, UA neg    Ketones, UA neg    Spec Grav, UA 1.010 1.010 - 1.025   Blood, UA neg    pH, UA 6.0 5.0 - 8.0   Protein, UA Negative Negative   Urobilinogen, UA 0.2 0.2 or 1.0 E.U./dL   Nitrite, UA neg    Leukocytes, UA Negative Negative   Appearance     Odor    POCT urinalysis dipstick     Status: Normal   Collection Time: 08/20/18 11:47 AM  Result Value Ref Range   Color, UA yellow    Clarity, UA clear    Glucose, UA Negative Negative   Bilirubin, UA neg    Ketones, UA neg    Spec Grav, UA 1.010 1.010 - 1.025   Blood, UA neg    pH, UA 6.5 5.0 - 8.0   Protein, UA Negative Negative   Urobilinogen, UA 0.2 0.2 or 1.0 E.U./dL   Nitrite, UA neg    Leukocytes, UA Negative Negative   Appearance     Odor    Urine Culture     Status: None   Collection Time: 08/20/18 11:47 AM  Result Value Ref Range   MICRO NUMBER: [id number]    SPECIMEN QUALITY: ADEQUATE    Sample Source NOT GIVEN    STATUS: FINAL    Result: No Growth    Objective  Body mass index is 30.32 kg/m. Wt Readings from Last 3 Encounters:  09/16/18 182 lb 3.2 oz (82.6 kg)  09/07/18 179 lb 9.6 oz (81.5 kg)  08/20/18 180 lb (81.6 kg)   Temp Readings from Last 3 Encounters:  09/16/18 98.2 F (36.8 C) (Oral)  08/20/18 98.2 F (36.8 C) (Oral)  08/10/18 98.4 F (36.9 C) (Oral)   BP Readings from Last 3 Encounters:  09/16/18 122/62  09/07/18 128/70  08/20/18 (!) 136/92   Pulse Readings from Last 3 Encounters:  09/16/18 76  09/07/18 68  08/20/18 73    Physical Exam  Constitutional: She is oriented to person, place, and time. Vital signs are normal. She  appears well-developed and well-nourished. She is cooperative.  HENT:  Head: Normocephalic and atraumatic.  Left Ear: No drainage.  Mouth/Throat: Oropharynx is clear and moist and mucous membranes are normal.  Left ear redness   Eyes: Pupils are equal, round, and reactive to light. Conjunctivae are normal.  Cardiovascular: Normal rate, regular rhythm and normal heart sounds.  Pulmonary/Chest: Effort normal and breath sounds normal.  Neurological: She is alert and oriented to person, place, and time. Gait normal.  Skin: Skin is warm and dry.  Psychiatric: She has a normal mood and affect. Her speech is normal and behavior is normal. Judgment and thought content normal. Cognition and memory are normal.  Nursing note and vitals reviewed.   Assessment   1. Left ear pain h/o eustachian tube dysfunction, fluid in b/l ears likely related to allergies with acute OM Plan   1. Augmentin, trial of ciprodex in left ear  F/u with ENT Dr. Tami Ribas 09/2018  Disc consider [place] allergy (Dr. Orvil Feil in future for allergy testing and allergy shots vs ENT   Provider: Dr. Olivia Mackie McLean-Scocuzza-[facility]

## 2018-09-16 NOTE — Patient Instructions (Signed)
[facility] Dr. Orvil Feil    Otitis Media, Adult Otitis media occurs when there is inflammation and fluid in the middle ear. Your middle ear is a part of the ear that contains bones for hearing as well as air that helps send sounds to your brain. What are the causes? This condition is caused by a blockage in the eustachian tube. This tube drains fluid from the ear to the back of the nose (nasopharynx). A blockage in this tube can be caused by an object or by swelling (edema) in the tube. Problems that can cause a blockage include:  A cold or other upper respiratory infection.  Allergies.  An irritant, such as tobacco smoke.  Enlarged adenoids. The adenoids are areas of soft tissue located high in the back of the throat, behind the nose and the roof of the mouth.  A mass in the nasopharynx.  Damage to the ear caused by pressure changes (barotrauma).  What are the signs or symptoms? Symptoms of this condition include:  Ear pain.  A fever.  Decreased hearing.  A headache.  Tiredness (lethargy).  Fluid leaking from the ear.  Ringing in the ear.  How is this diagnosed? This condition is diagnosed with a physical exam. During the exam your health care provider will use an instrument called an otoscope to look into your ear and check for redness, swelling, and fluid. He or she will also ask about your symptoms. Your health care provider may also order tests, such as:  A test to check the movement of the eardrum (pneumatic otoscopy). This test is done by squeezing a small amount of air into the ear.  A test that changes air pressure in the middle ear to check how well the eardrum moves and whether the eustachian tube is working (tympanogram).  How is this treated? This condition usually goes away on its own within 3-5 days. But if the condition is caused by a bacteria infection and does not go away own its own, or keeps coming back, your health care provider may:  Prescribe  antibiotic medicines to treat the infection.  Prescribe or recommend medicines to control pain.  Follow these instructions at home:  Take over-the-counter and prescription medicines only as told by your health care provider.  If you were prescribed an antibiotic medicine, take it as told by your health care provider. Do not stop taking the antibiotic even if you start to feel better.  Keep all follow-up visits as told by your health care provider. This is important. Contact a health care provider if:  You have bleeding from your nose.  There is a lump on your neck.  You are not getting better in 5 days.  You feel worse instead of better. Get help right away if:  You have severe pain that is not controlled with medicine.  You have swelling, redness, or pain around your ear.  You have stiffness in your neck.  A part of your face is paralyzed.  The bone behind your ear (mastoid) is tender when you touch it.  You develop a severe headache. Summary  Otitis media is redness, soreness, and swelling of the middle ear.  This condition usually goes away on its own within 3-5 days.  If the problem does not go away in 3-5 days, your health care provider may prescribe or recommend medicines to treat your symptoms.  If you were prescribed an antibiotic medicine, take it as told by your health care provider. This information  is not intended to replace advice given to you by your health care provider. Make sure you discuss any questions you have with your health care provider. Document Released: 07/18/2004 Document Revised: 10/03/2016 Document Reviewed: 10/03/2016 Elsevier Interactive Patient Education  [employer].

## 2018-09-16 NOTE — Progress Notes (Signed)
Pre visit review using our clinic review tool, if applicable. No additional management support is needed unless otherwise documented below in the visit note. 

## 2018-09-21 DIAGNOSIS — B379 Candidiasis, unspecified: Secondary | ICD-10-CM | POA: Diagnosis not present

## 2018-09-21 DIAGNOSIS — H903 Sensorineural hearing loss, bilateral: Secondary | ICD-10-CM | POA: Diagnosis not present

## 2018-09-21 DIAGNOSIS — J309 Allergic rhinitis, unspecified: Secondary | ICD-10-CM | POA: Diagnosis not present

## 2018-09-24 ENCOUNTER — Other Ambulatory Visit: Payer: Self-pay | Admitting: Cardiovascular Disease

## 2018-09-27 NOTE — Telephone Encounter (Signed)
Please review for refill on Losartan 25 mg.  The last office visit with Dr. Rockey Situ is not showing Losartan on medication list.

## 2018-09-28 NOTE — Telephone Encounter (Signed)
I called and spoke with the patient. She confirms that she did go back on losartan 50 mg once daily after seeing Dr. Rockey Situ on 07/02/18.  Per Dr. Donivan Scull note: "Hypertension She is troubled by the fatigue from metoprolol succinate She could try to decrease the dose down to metoprolol succinate 50, and take extra 25 as needed for breakthrough palpitations Could retry losartan for blood pressure if needed She will call if she would like any changes"  She states this has been working very well for her BP. She has been taking losartan 25 mg- 2 tablets (50 mg) once daily. She would like to continue this. I advised I will send her RX to the pharmacy.

## 2018-12-01 ENCOUNTER — Encounter: Payer: Self-pay | Admitting: Pulmonary Disease

## 2018-12-01 ENCOUNTER — Ambulatory Visit (INDEPENDENT_AMBULATORY_CARE_PROVIDER_SITE_OTHER): Payer: Medicare Other | Admitting: Pulmonary Disease

## 2018-12-01 VITALS — BP 130/68 | HR 73 | Temp 98.1°F | Ht 65.0 in | Wt 180.0 lb

## 2018-12-01 DIAGNOSIS — J3089 Other allergic rhinitis: Secondary | ICD-10-CM

## 2018-12-01 DIAGNOSIS — G4733 Obstructive sleep apnea (adult) (pediatric): Secondary | ICD-10-CM | POA: Diagnosis not present

## 2018-12-01 DIAGNOSIS — R059 Cough, unspecified: Secondary | ICD-10-CM

## 2018-12-01 DIAGNOSIS — J302 Other seasonal allergic rhinitis: Secondary | ICD-10-CM

## 2018-12-01 DIAGNOSIS — R05 Cough: Secondary | ICD-10-CM

## 2018-12-01 DIAGNOSIS — Z9989 Dependence on other enabling machines and devices: Secondary | ICD-10-CM | POA: Diagnosis not present

## 2018-12-01 DIAGNOSIS — J45909 Unspecified asthma, uncomplicated: Secondary | ICD-10-CM

## 2018-12-01 DIAGNOSIS — R058 Other specified cough: Secondary | ICD-10-CM

## 2018-12-01 LAB — NITRIC OXIDE: Nitric Oxide: 7

## 2018-12-01 MED ORDER — MONTELUKAST SODIUM 10 MG PO TABS: 10.0000 mg | ORAL_TABLET | Freq: Every day | ORAL | 3 refills | Status: DC

## 2018-12-01 MED ORDER — BECLOMETHASONE DIPROP HFA 80 MCG/ACT IN AERB: 2.0000 | INHALATION_SPRAY | Freq: Two times a day (BID) | RESPIRATORY_TRACT | 3 refills | Status: DC

## 2018-12-01 NOTE — Patient Instructions (Signed)
Try using nasal irrigation once or twice per day Can look up getting [facility] for nasal irrigation Start taking allegra again Call or email if your symptoms get worse  Follow up in 6 months

## 2018-12-01 NOTE — Progress Notes (Signed)
[facility]  Chief Complaint  Patient presents with  . Acute Visit    cough since December. mucus "stuck" in throat. chest congestion. feverish with chills.     Constitutional:  BP 130/68 (BP Location: Right Arm, Cuff Size: Normal)   Pulse 73   Temp 98.1 F (36.7 C) (Oral)   Ht 5\' 5"  (1.651 m)   Wt 180 lb (81.6 kg)   SpO2 98%   BMI 29.95 kg/m   Past Medical History:  Vit D deficiency, Vertigo, Rosacea, Hypothyroidism, HTN, HLD, GERD, Diverticulosis, Depression, HA  Brief Summary:  Angela Mendoza is a 68 y.o. female former smoker with OSA and asthma.  For the past several weeks she has been getting more sinus congestion and postnasal drip.  She gets sore in her throat and has popping in her ears sometimes.  She had more chest congestion earlier this week, and then coughed up some clear to yellow sputum.  She had some wheezing then also.  She gets hot and then chills, but thinks this might be from hot flashes.  No skin rash, gland swelling, chest pain, reflux, or leg swelling.  She hasn't been using allegra recently or nasal irrigation.  Physical Exam:   Appearance - well kempt   ENMT - clear nasal mucosa, midline nasal  septum, no oral exudates, no LAN, trachea midline  Respiratory - normal chest wall, normal respiratory effort, no accessory muscle use, no wheeze/rales  CV - s1s2 regular rate and rhythm, no murmurs, no peripheral edema, radial pulses symmetric  GI - soft, non tender, no masses  Lymph - no adenopathy noted in neck and axillary areas  MSK - normal gait  Ext - no cyanosis, clubbing, or joint inflammation noted  Skin - no rashes, lesions, or ulcers  Neuro - normal strength, oriented x 3  Psych - normal mood and affect   Assessment/Plan:   Upper airway cough with allergic rhinitis. - don't think she needs ABx, prednisone or CXR at this time - advised her to resume allegra and nasal irrigation - advised she  could increase dymista to two sprays bid for now, but then resume 1 spray bid when she is feeling better - continue singulair  Allergic asthma. - refilled Qvar and singulair - prn albuterol  Obstructive sleep apnea. - she is compliant with CPAP and reports benefit - continue CPAP 11 cm H2O   Patient Instructions  Try using nasal irrigation once or twice per day Can look up getting Navage for nasal irrigation Start taking allegra again Call or email if your symptoms get worse  Follow up in 6 months  A total of  28 minutes were spent face to face with the patient and more than half of that time involved counseling or coordination of care.   Chesley Mires, MD [facility] Pager: [phone] 12/01/2018, 3:43 PM  Flow Sheet     Pulmonary tests:  Spirometry 12/12 >> FEV1% 68 RAST 11/15/14 >> IgE 30, cats/mold PFT 02/07/16 >> FEV1 2.32 (92%), FEV1% 76, FEF 25-75% 1.56 (71%), TLC 4.25 (81%), DLCO 121, borderline BD from FEF 25-75 FeNO 12/01/18 >> 7  Sleep tests:  PSG 09/06/07 >> AHI 19 CPAP 02/21/18 to 03/22/18 >> used on 30 of 30 nights with average 7 hrs 59 min.  Average AHI 0.5 with CPAP 11 cm  Medications:   Allergies as of 12/01/2018      Reactions   Codeine    Low blood pressure/nausea  Sulfonamide Derivatives    rash   Estradiol Rash   Generic estradiol patch d/t the adhesive      Medication List       Accurate as of December 01, 2018  3:43 PM. Always use your most recent med list.        AEROCHAMBER MV inhaler Use as instructed   albuterol 108 (90 Base) MCG/ACT inhaler Commonly known as:  PROVENTIL HFA Inhale 2 puffs into the lungs every 6 (six) hours as needed.   ALPRAZolam 0.25 MG tablet Commonly known as:  XANAX TAKE 1 TABLET (0.25 MG TOTAL) BY MOUTH ONCE DAILY AS NEEDED FOR SLEEP.   aspirin 81 MG tablet Take 81 mg by mouth daily.   atorvastatin 40 MG tablet Commonly known as:  LIPITOR Take 1 tablet (40 mg total) by mouth  daily.   Azelastine-Fluticasone 137-50 MCG/ACT Susp Commonly known as:  DYMISTA Place 1 spray into the nose 2 (two) times daily.   beclomethasone 80 MCG/ACT inhaler Commonly known as:  QVAR REDIHALER Inhale 2 puffs into the lungs 2 (two) times daily.   conjugated estrogens vaginal cream Commonly known as:  PREMARIN Place 1 Applicatorful vaginally 2 (two) times a week. 90 day supply,  Refill for one year   cyanocobalamin 1000 MCG/ML injection Commonly known as:  (VITAMIN B-12) Inject 1 mL (1,000 mcg total) into the muscle every 30 (thirty) days.   cycloSPORINE 0.05 % ophthalmic emulsion Commonly known as:  RESTASIS Place 1 drop into both eyes 2 (two) times daily.   diazepam 5 MG tablet Commonly known as:  VALIUM TAKE 1 TABLET BY MOUTH EVERY 6 HOURS AS NEEDED FOR VERTIGO   estradiol 0.1 MG/24HR patch Commonly known as:  VIVELLE-DOT Place 1 patch (0.1 mg total) onto the skin 2 (two) times a week.   famotidine 20 MG tablet Commonly known as:  PEPCID Take 1 tablet (20 mg total) by mouth daily as needed for heartburn or indigestion.   fexofenadine 180 MG tablet Commonly known as:  ALLEGRA ALLERGY Take 1 tablet (180 mg total) by mouth daily.   levothyroxine 50 MCG tablet Commonly known as:  SYNTHROID Take 1 tablet (50 mcg total) by mouth daily before breakfast.   losartan 25 MG tablet Commonly known as:  COZAAR Take 2 tablets (50 mg) by mouth once daily   meclizine 25 MG tablet Commonly known as:  ANTIVERT Take 1 tablet (25 mg total) by mouth 3 (three) times daily as needed.   metoprolol succinate 25 MG 24 hr tablet Commonly known as:  TOPROL-XL Take 2 tablets (50 mg total) by mouth daily.   montelukast 10 MG tablet Commonly known as:  SINGULAIR Take 1 tablet (10 mg total) by mouth at bedtime.   omega-3 acid ethyl esters 1 g capsule Commonly known as:  LOVAZA Take 1 capsule (1 g total) by mouth daily.   ondansetron 4 MG disintegrating tablet Commonly known as:   ZOFRAN ODT Take 1 tablet (4 mg total) by mouth every 8 (eight) hours as needed for nausea or vomiting.   promethazine 25 MG tablet Commonly known as:  PHENERGAN Take 1 tablet (25 mg total) by mouth every 6 (six) hours as needed for nausea or vomiting.   psyllium 58.6 % powder Commonly known as:  METAMUCIL Take 1 packet by mouth as needed.   STOOL SOFTENER PO Take 1 tablet by mouth as needed.   SYRINGE 3CC/20GX1" 20G X 1" 3 ML Misc 1 Syringe by Does not apply route every  30 (thirty) days. For use with b-12 injection monthly   SYSTANE BALANCE 0.6 % Soln Generic drug:  Propylene Glycol Apply 1 drop to eye as needed.   tretinoin 0.05 % cream Commonly known as:  RETIN-A Apply topically at bedtime.   urea 10 % cream Commonly known as:  CARMOL Apply topically as needed.   Vitamin D3 50 MCG (2000 UT) Tabs Take 1 tablet by mouth daily.   Vitamin-B Complex Tabs Take 1 tablet by mouth daily.       Past Surgical History:  She  has a past surgical history that includes Cholecystectomy (2007); Appendectomy (2007); Tubal ligation; Total abdominal hysterectomy (1991); Nasal sinus surgery; Breast enhancement surgery (2006); Cardiovascular stress test (2013); Colonoscopy (08/2012); Esophagogastroduodenoscopy (08/2012); US ECHOCARDIOGRAPHY (09/2009); US ECHOCARDIOGRAPHY (11/2007); sleep study (08/2007); Rectocele repair (12/2008); Colonoscopy (2006); Breast biopsy (Left, 09/27/2009); and Hernia repair.  Family History:  Her family history includes AAA (abdominal aortic aneurysm) in her paternal uncle; Aortic dissection in her maternal aunt and paternal uncle; Breast cancer in her maternal aunt; Cancer in her cousin and paternal aunt; Cancer (age of onset: 14) in her father; Cancer (age of onset: 7) in her mother; Heart attack in her paternal uncle; Heart disease in her paternal uncle; Heart failure in her sister; Hypertension in her sister; Stroke in her paternal uncle and sister.  Social  History:  She  reports that she quit smoking about 45 years ago. Her smoking use included cigarettes. She has a 2.10 pack-year smoking history. She has never used smokeless tobacco. She reports current alcohol use. She reports that she does not use drugs.

## 2018-12-07 ENCOUNTER — Telehealth: Payer: Self-pay | Admitting: Pulmonary Disease

## 2018-12-07 MED ORDER — AZELASTINE-FLUTICASONE 137-50 MCG/ACT NA SUSP: 1.0000 | Freq: Two times a day (BID) | NASAL | 3 refills | Status: DC

## 2018-12-07 NOTE — Telephone Encounter (Signed)
[facility], spoke with Otila Kluver.  Medication record in epic showed Dr Halford Chessman, sent new prescription 12/01/18.  Otila Kluver stated that shipment was sent 12/02/18. Called and notified Patient of shipment.  She requested a refill for Dymista to be sent to [facility].  Dymista refill sent to requested pharmacy.  Nothing further at this time.

## 2018-12-13 DIAGNOSIS — D485 Neoplasm of uncertain behavior of skin: Secondary | ICD-10-CM | POA: Diagnosis not present

## 2018-12-13 DIAGNOSIS — L820 Inflamed seborrheic keratosis: Secondary | ICD-10-CM | POA: Diagnosis not present

## 2018-12-14 DIAGNOSIS — S61011A Laceration without foreign body of right thumb without damage to nail, initial encounter: Secondary | ICD-10-CM | POA: Diagnosis not present

## 2018-12-23 ENCOUNTER — Other Ambulatory Visit: Payer: Self-pay | Admitting: *Deleted

## 2018-12-23 ENCOUNTER — Other Ambulatory Visit: Payer: Self-pay | Admitting: Vascular Surgery

## 2018-12-23 DIAGNOSIS — Z1231 Encounter for screening mammogram for malignant neoplasm of breast: Secondary | ICD-10-CM

## 2018-12-23 DIAGNOSIS — I10 Essential (primary) hypertension: Secondary | ICD-10-CM

## 2018-12-23 MED ORDER — METOPROLOL SUCCINATE ER 25 MG PO TB24: 50.0000 mg | ORAL_TABLET | Freq: Every day | ORAL | 3 refills | Status: DC

## 2018-12-23 NOTE — Telephone Encounter (Signed)
Spoke with patient and reviewed reason for metoprolol 25 mg pill verses 50 mg pill due to extended release and the inability to cut that pill in half. She verbalized understanding and preferred to stay on the 25 mg pill because she does take the extra dose at times. Will send in refill for her to pharmacy and she was appreciative for the call with no further questions at this time.

## 2018-12-23 NOTE — Telephone Encounter (Signed)
Pt would like to know if metoprolol succinate 25 mg tablet Taking 2 tablets qd is as effective as taking a whole Metoprolol Succinate 50 mg tablet?  Pt doesn't mind taking 2 tablets but would like to take less pills if possible. Pt mentioned she was told by Dr. Rockey Situ that she can occasionally take an extra 25 mg tablet for increased BP/palpitations if needed which is why she has the 25 mg tablet.  Pt mentioned she doesn't take extra tablet every day but every now and then/on stressful days.  Please advise if new Rx is needed/amount that is being giving is reasonable.

## 2018-12-23 NOTE — Telephone Encounter (Signed)
Dr. Rockey Situ had ordered metoprolol 25 mg taking 2 tablets daily with instructions to have extra if needed for palpitations or increased BP. The reason we did not order the 50 mg pill is because with extended release we do not advise breaking in half. So the 25 mg pill allows to take additional dose when needed. Let me know if I can assist any further.

## 2018-12-23 NOTE — Telephone Encounter (Signed)
Patient returning call.

## 2019-01-31 ENCOUNTER — Other Ambulatory Visit: Payer: Self-pay | Admitting: Internal Medicine

## 2019-01-31 NOTE — Telephone Encounter (Signed)
Requested medication (s) are due for refill today: yes  Requested medication (s) are on the active medication list: yes  Last refill:  03/31/17  Future visit scheduled: yes  Notes to clinic:  Medication not delegated to NT to refill   Requested Prescriptions  Pending Prescriptions Disp Refills   ALPRAZolam (XANAX) 0.25 MG tablet 30 tablet 2    Sig: TAKE 1 TABLET (0.25 MG TOTAL) BY MOUTH ONCE DAILY AS NEEDED FOR SLEEP.     Not Delegated - Psychiatry:  Anxiolytics/Hypnotics Failed - 01/31/2019  3:55 PM      Failed - This refill cannot be delegated      Failed - Urine Drug Screen completed in last 360 days.      Passed - Valid encounter within last 6 months    Recent Outpatient Visits          4 months ago Acute otitis media, unspecified otitis media type   [place] [facility]Scocuzza, Nino Glow, MD   5 months ago Vaginal dryness   [place], FNP   5 months ago Burning with urination   [place], FNP   9 months ago Fatigue, unspecified type   [place], MD   10 months ago Hypothyroidism due to medication   [place] Crecencio Mc, MD      Future Appointments            In 3 months O'Brien-Blaney, Bryson Corona, LPN [place], [place]   In 3 months Derrel Nip, Aris Everts, MD [facility], [place]

## 2019-02-03 NOTE — Telephone Encounter (Signed)
Patient has been scheduled for Monday.

## 2019-02-07 ENCOUNTER — Ambulatory Visit (INDEPENDENT_AMBULATORY_CARE_PROVIDER_SITE_OTHER): Payer: Medicare Other | Admitting: Internal Medicine

## 2019-02-07 DIAGNOSIS — J302 Other seasonal allergic rhinitis: Secondary | ICD-10-CM

## 2019-02-07 DIAGNOSIS — Z7189 Other specified counseling: Secondary | ICD-10-CM | POA: Diagnosis not present

## 2019-02-07 DIAGNOSIS — J4520 Mild intermittent asthma, uncomplicated: Secondary | ICD-10-CM | POA: Diagnosis not present

## 2019-02-07 DIAGNOSIS — J3089 Other allergic rhinitis: Secondary | ICD-10-CM

## 2019-02-07 DIAGNOSIS — I10 Essential (primary) hypertension: Secondary | ICD-10-CM | POA: Diagnosis not present

## 2019-02-07 DIAGNOSIS — E034 Atrophy of thyroid (acquired): Secondary | ICD-10-CM | POA: Diagnosis not present

## 2019-02-07 DIAGNOSIS — F419 Anxiety disorder, unspecified: Secondary | ICD-10-CM | POA: Diagnosis not present

## 2019-02-07 DIAGNOSIS — E782 Mixed hyperlipidemia: Secondary | ICD-10-CM | POA: Diagnosis not present

## 2019-02-07 MED ORDER — LEVOTHYROXINE SODIUM 50 MCG PO TABS: 50.0000 ug | ORAL_TABLET | Freq: Every day | ORAL | 3 refills | Status: DC

## 2019-02-07 MED ORDER — PAROXETINE HCL 10 MG PO TABS: 10.0000 mg | ORAL_TABLET | Freq: Every day | ORAL | 1 refills | Status: DC

## 2019-02-07 MED ORDER — ALPRAZOLAM 0.25 MG PO TABS: ORAL_TABLET | ORAL | 2 refills | Status: DC

## 2019-02-07 MED ORDER — PAROXETINE HCL 10 MG PO TABS: 10.0000 mg | ORAL_TABLET | Freq: Every day | ORAL | 0 refills | Status: DC

## 2019-02-07 NOTE — Patient Instructions (Signed)
Please start the Paxil   at 1/2 tablet daily in the evening after your dinner meAL  for the first few days to avoid  The side effect of nausea that can happen when you take it on an empty stomach.  .  You can increase the dose to a full tablet after 4 days if you have  not developed side effects of nausea.  If the Paxil interferes with your sleep, you can take it in the morning instead  After two weeks on the 10  Mg dose, let me know how it is working.  e mail me to let me know how it is helping .  I sent a 90 day supply to [facility] . We can increase the dose to 2 tablets daily if we need to   I have refilled the alprazolam as well.  Try not to use it every day

## 2019-02-07 NOTE — Progress Notes (Addendum)
Virtual Visit via [email or web address]  This visit type was conducted due to national recommendations for restrictions regarding the COVID-19 pandemic (e.g. social distancing).  This format is felt to be most appropriate for this patient at this time.  All issues noted in this document were discussed and addressed.  No physical exam was performed (except for noted visual exam findings with Video Visits).   I connected with@ on 02/10/19 at 10:00 AM EDT by a video enabled telemedicine application or telephone and verified that I am speaking with the correct person using two identifiers. Location patient: home Location provider: work or home office Persons participating in the virtual visit: patient, provider  I discussed the limitations, risks, security and privacy concerns of performing an evaluation and management service by telephone and the availability of in person appointments. I also discussed with the patient that there may be a patient responsible charge related to this service. The patient expressed understanding and agreed to proceed.  Reason for visit: med refill,  Follow up on GAD and hypothyroid  HPI:  68 yr old white female with history of asthma ,  seasonal rhinitis  GERD and GAD.   Tearful today; having a hard time due to worrying about she or husband becoming ill with COVID 19. Sheltering at home. Children bringing food and leaving by door.  Not sleeping well,   Not eating well.  Discussed trial of paxil and renewing alprazolam for prn use   Sees pulmonary for OSA and asthma.  Last visit Feb 5  No meds prescribed for cough attributed to allergies . Discussed adding saline irrigation to daily routine if going out in yard    Treated for otitis media in  November    Treated for UTI Oct 15 [facility] ,  Recurrent dysuria eval by  [facility] in October 25  With repeat culture and UA  Both negative for infection. Using premarin cream for atrophic vaginitis.      ROS: See pertinent positives and negatives  per HPI.  Past Medical History:  Diagnosis Date  . ABNORMAL HEART RHYTHMS 06/09/2008  . Allergic rhinitis 06/09/2008   chronic  . Asthma, intrinsic 10/06/2011   controlled on qvar. [name] 09/2011:  Very mild airflow obstruction (FEV1% 68, FVL c/w obstruction)   . Chronic headache 06/09/2008  . COPD (chronic obstructive pulmonary disease) ([place])   . Depression   . Diverticulosis 2013   h/o diverticulitis  . Dry eyes   . GERD (gastroesophageal reflux disease)   . History of breast implant removal    silicone mastitis - R axilla silicone LN, free silicone L breast upper outer quadrant  . Hot flashes   . HYPERLIPIDEMIA 06/09/2008  . HYPERTENSION 06/09/2008  . Hypothyroidism   . MVP (mitral valve prolapse)   . OSA on CPAP    [name]  . Pernicious anemia    per prior pcp  . Rosacea   . Surgical menopause 1991  . Vertigo   . Vitamin D deficiency     Past Surgical History:  Procedure Laterality Date  . APPENDECTOMY  2007  . BREAST BIOPSY Left 09/27/2009  . BREAST ENHANCEMENT SURGERY  2006  . CARDIOVASCULAR STRESS TEST  2013   [place] [name] - WNL, EF 55-60%, with 0 calcium score  . CHOLECYSTECTOMY  2007   biliary dyskinesia  . COLONOSCOPY  08/2012   diverticulosis  . COLONOSCOPY  2006   inflamed hyperplastic polyp  . ESOPHAGOGASTRODUODENOSCOPY  08/2012   esophageal reflux  . HERNIA REPAIR    .  NASAL SINUS SURGERY    . RECTOCELE REPAIR  12/2008   [name]  . sleep study  08/2007   OSA, 12 cm H2O,  . TOTAL ABDOMINAL HYSTERECTOMY  1991   heavy bleeding, ovaries removed  . TUBAL LIGATION    . US ECHOCARDIOGRAPHY  09/2009   impaired relaxation, mild tric regurg, normal MV, EF 60%  . US ECHOCARDIOGRAPHY  11/2007   mild-mod MR    Family History  Problem Relation Age of Onset  . Cancer Mother 51       lung, passive smoke  . Cancer Father 58       lung, smoker  . Cancer Paternal Aunt        breast, lung  . Cancer Cousin        breast  . Stroke Sister        TIA   . Hypertension Sister   . Heart failure Sister   . Aortic dissection Paternal Uncle   . Heart attack Paternal Uncle   . AAA (abdominal aortic aneurysm) Paternal Uncle   . Aortic dissection Maternal Aunt   . Breast cancer Maternal Aunt   . Heart disease Paternal Uncle   . Stroke Paternal Uncle     SOCIAL HX: former smoker, no alcohol ,. Homemaker    Current Outpatient Medications:  .  albuterol (PROVENTIL HFA) 108 (90 Base) MCG/ACT inhaler, Inhale 2 puffs into the lungs every 6 (six) hours as needed., Disp: 3 Inhaler, Rfl: 3 .  ALPRAZolam (XANAX) 0.25 MG tablet, TAKE 1 TABLET (0.25 MG TOTAL) BY MOUTH ONCE DAILY AS NEEDED FOR SLEEP., Disp: 30 tablet, Rfl: 2 .  aspirin 81 MG tablet, Take 81 mg by mouth once a week. , Disp: , Rfl:  .  atorvastatin (LIPITOR) 40 MG tablet, Take 1 tablet (40 mg total) by mouth daily., Disp: 90 tablet, Rfl: 3 .  Azelastine-Fluticasone (DYMISTA) 137-50 MCG/ACT SUSP, Place 1 spray into the nose 2 (two) times daily., Disp: 3 Bottle, Rfl: 3 .  B Complex Vitamins (VITAMIN-B COMPLEX) TABS, Take 1 tablet by mouth daily., Disp: , Rfl:  .  beclomethasone (QVAR REDIHALER) 80 MCG/ACT inhaler, Inhale 2 puffs into the lungs 2 (two) times daily., Disp: 3 Inhaler, Rfl: 3 .  Cholecalciferol (VITAMIN D3) 2000 units TABS, Take 1 tablet by mouth daily., Disp: 90 tablet, Rfl: 3 .  conjugated estrogens (PREMARIN) vaginal cream, Place 1 Applicatorful vaginally 2 (two) times a week. 90 day supply,  Refill for one year, Disp: 90 g, Rfl: 3 .  cyanocobalamin (,VITAMIN B-12,) 1000 MCG/ML injection, Inject 1 mL (1,000 mcg total) into the muscle every 30 (thirty) days., Disp: 3 mL, Rfl: 3 .  cycloSPORINE (RESTASIS) 0.05 % ophthalmic emulsion, Place 1 drop into both eyes 2 (two) times daily., Disp: 3 each, Rfl: 3 .  diazepam (VALIUM) 5 MG tablet, TAKE 1 TABLET BY MOUTH EVERY 6 HOURS AS NEEDED FOR VERTIGO, Disp: 30 tablet, Rfl: 2 .  Docusate Calcium (STOOL SOFTENER PO), Take 1 tablet by mouth  as needed., Disp: , Rfl:  .  estradiol (VIVELLE-DOT) 0.1 MG/24HR patch, Place 1 patch (0.1 mg total) onto the skin 2 (two) times a week., Disp: 24 patch, Rfl: 3 .  famotidine (PEPCID) 20 MG tablet, Take 1 tablet (20 mg total) by mouth daily as needed for heartburn or indigestion., Disp: 90 tablet, Rfl: 3 .  fexofenadine (ALLEGRA ALLERGY) 180 MG tablet, Take 1 tablet (180 mg total) by mouth daily., Disp: 90 tablet, Rfl: 3 .  levothyroxine (SYNTHROID) 50 MCG tablet, Take 1 tablet (50 mcg total) by mouth daily before breakfast., Disp: 90 tablet, Rfl: 3 .  losartan (COZAAR) 25 MG tablet, Take 2 tablets (50 mg) by mouth once daily, Disp: 180 tablet, Rfl: 2 .  meclizine (ANTIVERT) 25 MG tablet, Take 1 tablet (25 mg total) by mouth 3 (three) times daily as needed., Disp: 270 tablet, Rfl: 3 .  metoprolol succinate (TOPROL-XL) 25 MG 24 hr tablet, Take 2-3 tablets (50-75 mg total) by mouth daily. Take two tablets by mouth daily may take additional 25 mg tablet for palpitations or increased BP., Disp: 270 tablet, Rfl: 3 .  montelukast (SINGULAIR) 10 MG tablet, Take 1 tablet (10 mg total) by mouth at bedtime., Disp: 90 tablet, Rfl: 3 .  Multiple Vitamin (MULTIVITAMIN) tablet, Take 1 tablet by mouth daily., Disp: , Rfl:  .  omega-3 acid ethyl esters (LOVAZA) 1 g capsule, Take 1 capsule (1 g total) by mouth daily., Disp: 90 capsule, Rfl: 3 .  ondansetron (ZOFRAN ODT) 4 MG disintegrating tablet, Take 1 tablet (4 mg total) by mouth every 8 (eight) hours as needed for nausea or vomiting., Disp: 20 tablet, Rfl: 0 .  promethazine (PHENERGAN) 25 MG tablet, Take 1 tablet (25 mg total) by mouth every 6 (six) hours as needed for nausea or vomiting., Disp: 90 tablet, Rfl: 3 .  Propylene Glycol (SYSTANE BALANCE) 0.6 % SOLN, Apply 1 drop to eye as needed., Disp: , Rfl:  .  psyllium (METAMUCIL) 58.6 % powder, Take 1 packet by mouth as needed., Disp: , Rfl:  .  Spacer/Aero-Holding Chambers (AEROCHAMBER MV) inhaler, Use as  instructed, Disp: 1 each, Rfl: 3 .  Syringe/Needle, Disp, (SYRINGE 3CC/20GX1") 20G X 1" 3 ML MISC, 1 Syringe by Does not apply route every 30 (thirty) days. For use with b-12 injection monthly, Disp: 50 each, Rfl: 0 .  tretinoin (RETIN-A) 0.05 % cream, Apply topically at bedtime., Disp: 45 g, Rfl: 2 .  urea (CARMOL) 10 % cream, Apply topically as needed., Disp: 90 g, Rfl: 3 .  PARoxetine (PAXIL) 10 MG tablet, Take 1 tablet (10 mg total) by mouth daily., Disp: 30 tablet, Rfl: 0 .  PARoxetine (PAXIL) 10 MG tablet, Take 1 tablet (10 mg total) by mouth daily., Disp: 90 tablet, Rfl: 1  EXAM:  VITALS per patient if applicable:  GENERAL: alert, oriented, appears well and in no acute distress  HEENT: atraumatic, conjunttiva clear, no obvious abnormalities on inspection of external nose and ears  NECK: normal movements of the head and neck  LUNGS: on inspection no signs of respiratory distress, breathing rate appears normal, no obvious gross SOB, gasping or wheezing. Did not cough during entire visit.   CV: no obvious cyanosis  MS: moves all visible extremities without noticeable abnormality  PSYCH/NEURO: pleasant and cooperative, tearful and anxious briefly , but  speech and thought processing grossly intact  ASSESSMENT AND PLAN:  Discussed the following assessment and plan:  Anxiety Current state aggravated by COVID 19 epidemic and concern for mortality given history of asthma and husband's history of COPD.  Anorexia and insomnia reported.  Prior trial of zoloft not tolerated.  Initiating paxil ,  And alprazolam refilled for prn use,  The risks and benefits of benzodiazepine use were discussed with patient today including excessive sedation leading to respiratory depression,  impaired thinking/driving, and addiction.  Patient was advised to avoid concurrent use with alcohol, to use medication only as needed and not to share with others  .  Hypothyroidism Thyroid function was normal last  May on  NAME BRAND ONLY SYNTHROID 50  MCG . She does not want to leave home for testing but has no signs of symptoms of malfunctioning thyroid  Lab Results  Component Value Date   TSH 2.26 27/25/3664     Uncomplicated asthma Managed with Pulmonology .  No recent exacerbations.  Continue aggressive management of GERD and allergic rhinitis and continue "shelter at home" precautions during COVID 19 epidemic   Seasonal and perennial allergic rhinitis Continue antihistamine ,  May increase to bid if needed,  Add salieirrigations after any outside exposure.   Essential hypertension, benign Well controlled on current regimen of losartan 25 mg and metoprolol XL 25 mg  by home readings . no changes today.  Mixed hyperlipidemia With cerebral microvascular disease by MRI . Continue  aspirin oce weekly due to excessive bruising, and daily Lipitor. She has had no events.  LDL is < 100 bu July 2019 labs.  Will repeat once COVID 19 epidemic has passed.   Lab Results  Component Value Date   CHOL 162 04/28/2018   HDL 57.20 04/28/2018   LDLCALC 84 04/28/2018   LDLDIRECT 84.0 04/05/2015   TRIG 103.0 04/28/2018   CHOLHDL 3 04/28/2018     Educated About Covid-19 Virus Infection COVID-19 Education: The signs and symptoms of COVID-19 were discussed with the patient and how to seek care for testing (follow up with PCP or arrange E-visit).  The importance of social distancing was discussed today   Updated Medication List Outpatient Encounter Medications as of 02/07/2019  Medication Sig  . albuterol (PROVENTIL HFA) 108 (90 Base) MCG/ACT inhaler Inhale 2 puffs into the lungs every 6 (six) hours as needed.  . ALPRAZolam (XANAX) 0.25 MG tablet TAKE 1 TABLET (0.25 MG TOTAL) BY MOUTH ONCE DAILY AS NEEDED FOR SLEEP.  [name] aspirin 81 MG tablet Take 81 mg by mouth once a week.   [name] atorvastatin (LIPITOR) 40 MG tablet Take 1 tablet (40 mg total) by mouth daily.  . Azelastine-Fluticasone (DYMISTA) 137-50 MCG/ACT  SUSP Place 1 spray into the nose 2 (two) times daily.  . B Complex Vitamins (VITAMIN-B COMPLEX) TABS Take 1 tablet by mouth daily.  . beclomethasone (QVAR REDIHALER) 80 MCG/ACT inhaler Inhale 2 puffs into the lungs 2 (two) times daily.  . Cholecalciferol (VITAMIN D3) 2000 units TABS Take 1 tablet by mouth daily.  [name] conjugated estrogens (PREMARIN) vaginal cream Place 1 Applicatorful vaginally 2 (two) times a week. 90 day supply,  Refill for one year  . cyanocobalamin (,VITAMIN B-12,) 1000 MCG/ML injection Inject 1 mL (1,000 mcg total) into the muscle every 30 (thirty) days.  . cycloSPORINE (RESTASIS) 0.05 % ophthalmic emulsion Place 1 drop into both eyes 2 (two) times daily.  . diazepam (VALIUM) 5 MG tablet TAKE 1 TABLET BY MOUTH EVERY 6 HOURS AS NEEDED FOR VERTIGO  . Docusate Calcium (STOOL SOFTENER PO) Take 1 tablet by mouth as needed.  [name] estradiol (VIVELLE-DOT) 0.1 MG/24HR patch Place 1 patch (0.1 mg total) onto the skin 2 (two) times a week.  . famotidine (PEPCID) 20 MG tablet Take 1 tablet (20 mg total) by mouth daily as needed for heartburn or indigestion.  . fexofenadine (ALLEGRA ALLERGY) 180 MG tablet Take 1 tablet (180 mg total) by mouth daily.  [name] levothyroxine (SYNTHROID) 50 MCG tablet Take 1 tablet (50 mcg total) by mouth daily before breakfast.  . losartan (COZAAR) 25 MG tablet Take 2 tablets (50 mg) by mouth  once daily  . meclizine (ANTIVERT) 25 MG tablet Take 1 tablet (25 mg total) by mouth 3 (three) times daily as needed.  . metoprolol succinate (TOPROL-XL) 25 MG 24 hr tablet Take 2-3 tablets (50-75 mg total) by mouth daily. Take two tablets by mouth daily may take additional 25 mg tablet for palpitations or increased BP.  . montelukast (SINGULAIR) 10 MG tablet Take 1 tablet (10 mg total) by mouth at bedtime.  . Multiple Vitamin (MULTIVITAMIN) tablet Take 1 tablet by mouth daily.  [name] omega-3 acid ethyl esters (LOVAZA) 1 g capsule Take 1 capsule (1 g total) by mouth daily.  .  ondansetron (ZOFRAN ODT) 4 MG disintegrating tablet Take 1 tablet (4 mg total) by mouth every 8 (eight) hours as needed for nausea or vomiting.  . promethazine (PHENERGAN) 25 MG tablet Take 1 tablet (25 mg total) by mouth every 6 (six) hours as needed for nausea or vomiting.  [name] Propylene Glycol (SYSTANE BALANCE) 0.6 % SOLN Apply 1 drop to eye as needed.  . psyllium (METAMUCIL) 58.6 % powder Take 1 packet by mouth as needed.  [name] Spacer/Aero-Holding Chambers (AEROCHAMBER MV) inhaler Use as instructed  . Syringe/Needle, Disp, (SYRINGE 3CC/20GX1") 20G X 1" 3 ML MISC 1 Syringe by Does not apply route every 30 (thirty) days. For use with b-12 injection monthly  . tretinoin (RETIN-A) 0.05 % cream Apply topically at bedtime.  . urea (CARMOL) 10 % cream Apply topically as needed.  . [DISCONTINUED] ALPRAZolam (XANAX) 0.25 MG tablet TAKE 1 TABLET (0.25 MG TOTAL) BY MOUTH ONCE DAILY AS NEEDED FOR SLEEP.  . [DISCONTINUED] levothyroxine (SYNTHROID) 50 MCG tablet Take 1 tablet (50 mcg total) by mouth daily before breakfast.  . PARoxetine (PAXIL) 10 MG tablet Take 1 tablet (10 mg total) by mouth daily.  [name] PARoxetine (PAXIL) 10 MG tablet Take 1 tablet (10 mg total) by mouth daily.   No facility-administered encounter medications on file as of 02/07/2019.       I discussed the assessment and treatment plan with the patient. The patient was provided an opportunity to ask questions and all were answered. The patient agreed with the plan and demonstrated an understanding of the instructions.   The patient was advised to call back or seek an in-person evaluation if the symptoms worsen or if the condition fails to improve as anticipated.  I provided 25 minutes of non-face-to-face time during this encounter.   Crecencio Mc, MD

## 2019-02-07 NOTE — Telephone Encounter (Signed)
Looks like I did, not sure why. I deleted it out and her list should be good now.  Thanks!  Philis Nettle

## 2019-02-08 ENCOUNTER — Encounter: Payer: Self-pay | Admitting: Internal Medicine

## 2019-02-08 NOTE — Assessment & Plan Note (Signed)
Continue antihistamine ,  May increase to bid if needed,  Add salieirrigations after any outside exposure.

## 2019-02-08 NOTE — Assessment & Plan Note (Signed)
Managed with Pulmonology .  No recent exacerbations.  Continue aggressive management of GERD and allergic rhinitis and continue "shelter at home" precautions during COVID 19 epidemic

## 2019-02-08 NOTE — Assessment & Plan Note (Signed)
With cerebral microvascular disease by MRI . Continue  aspirin oce weekly due to excessive bruising, and daily Lipitor. She has had no events.  LDL is < 100 bu July 2019 labs.  Will repeat once COVID 19 epidemic has passed.   Lab Results  Component Value Date   CHOL 162 04/28/2018   HDL 57.20 04/28/2018   LDLCALC 84 04/28/2018   LDLDIRECT 84.0 04/05/2015   TRIG 103.0 04/28/2018   CHOLHDL 3 04/28/2018

## 2019-02-08 NOTE — Assessment & Plan Note (Signed)
Well controlled on current regimen of losartan 25 mg and metoprolol XL 25 mg  by home readings . no changes today.

## 2019-02-08 NOTE — Assessment & Plan Note (Signed)
Thyroid function was normal last May on  NAME BRAND ONLY SYNTHROID 50  MCG . She does not want to leave home for testing but has no signs of symptoms of malfunctioning thyroid  Lab Results  Component Value Date   TSH 2.26 03/17/2018

## 2019-02-08 NOTE — Assessment & Plan Note (Signed)
Current state aggravated by COVID 19 epidemic and concern for mortality given history of asthma and husband's history of COPD.  Anorexia and insomnia reported.  Prior trial of zoloft not tolerated.  Initiating paxil ,  And alprazolam refilled for prn use,  The risks and benefits of benzodiazepine use were discussed with patient today including excessive sedation leading to respiratory depression,  impaired thinking/driving, and addiction.  Patient was advised to avoid concurrent use with alcohol, to use medication only as needed and not to share with others  .

## 2019-02-09 ENCOUNTER — Other Ambulatory Visit: Payer: Self-pay | Admitting: Internal Medicine

## 2019-02-09 NOTE — Telephone Encounter (Signed)
Refilled: 04/28/2018 Last OV: 02/07/2019 Next OV: 05/23/2019

## 2019-02-10 DIAGNOSIS — Z7189 Other specified counseling: Secondary | ICD-10-CM | POA: Insufficient documentation

## 2019-02-10 NOTE — Assessment & Plan Note (Signed)
COVID-19 Education: The signs and symptoms of COVID-19 were discussed with the patient and how to seek care for testing (follow up with PCP or arrange E-visit).  The importance of social distancing was discussed today 

## 2019-03-02 ENCOUNTER — Other Ambulatory Visit: Payer: Self-pay | Admitting: Internal Medicine

## 2019-03-13 IMAGING — CT CT HEART SCORING
2 series · 16 of 20 positions shown, 18 images · non-contrast
Comparison: None.

CLINICAL DATA: Risk stratification

EXAM:
Coronary Calcium Score
TECHNIQUE: The patient was scanned on a Siemens Somatom 64 slice scanner. Axial
non-contrast 3 mm slices were carried out through the heart. The
data set was analyzed on a dedicated work station and scored using
the Agatson method.

[Series 2: casc 3.0 i36f 2 bestdiast 69 % · axial · 0.35mm/px · z∈[-247,-142]mm · 8 of 46 slices shown, 10 images]
[im 6/46  vessel]
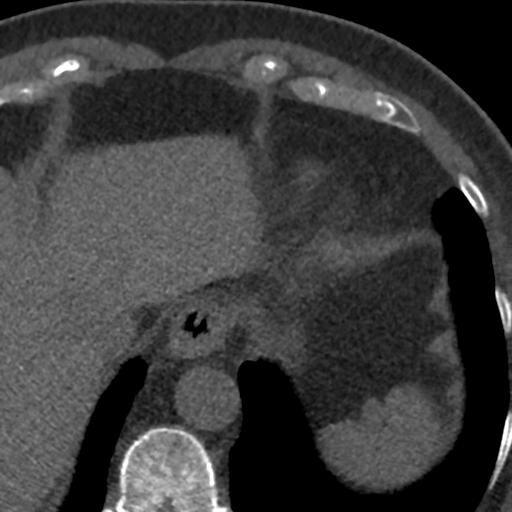
[im 6/46  lung]
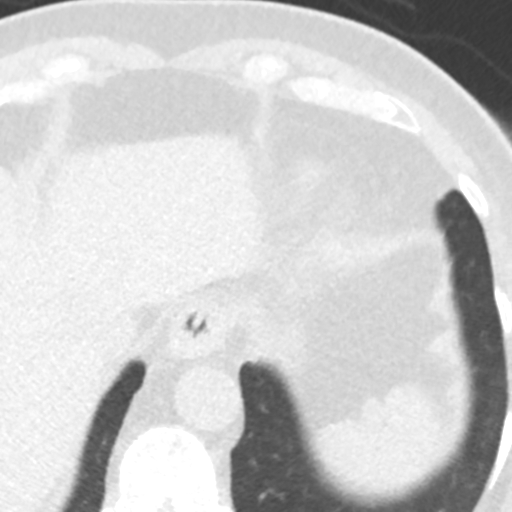
[im 11/46  vessel]
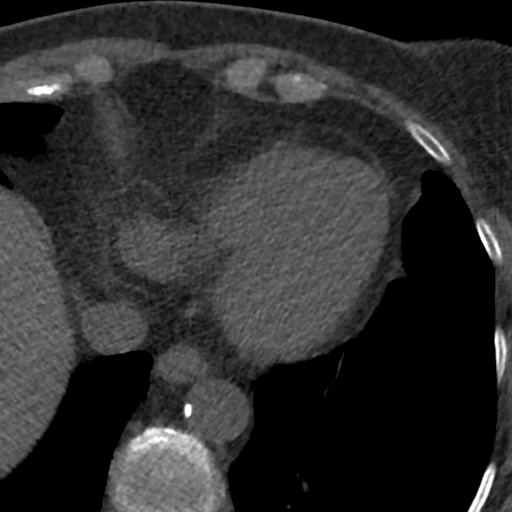
[im 16/46  vessel]
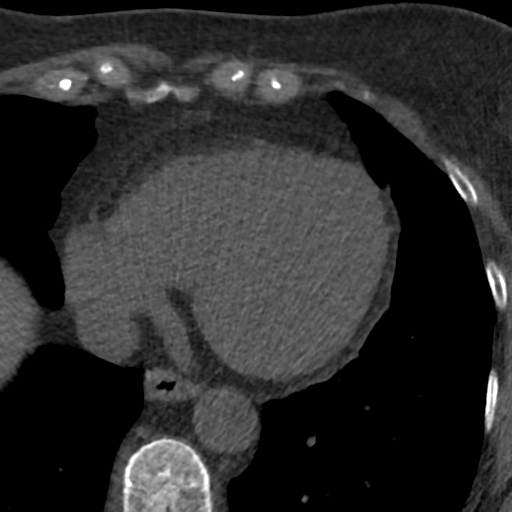
[im 21/46  vessel]
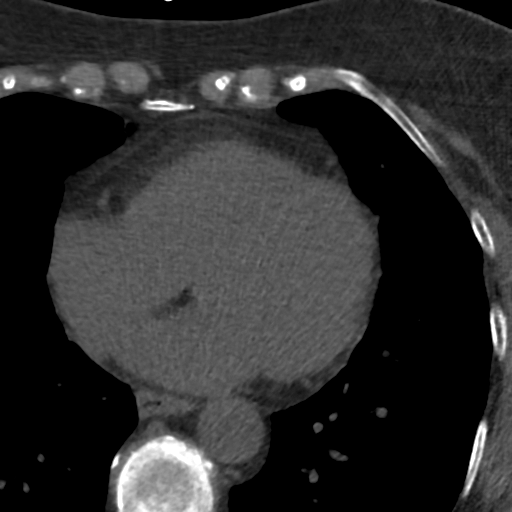
[im 26/46  vessel]
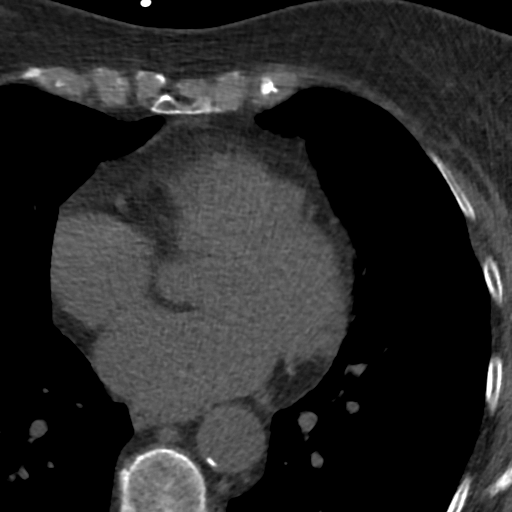
[im 26/46  lung]
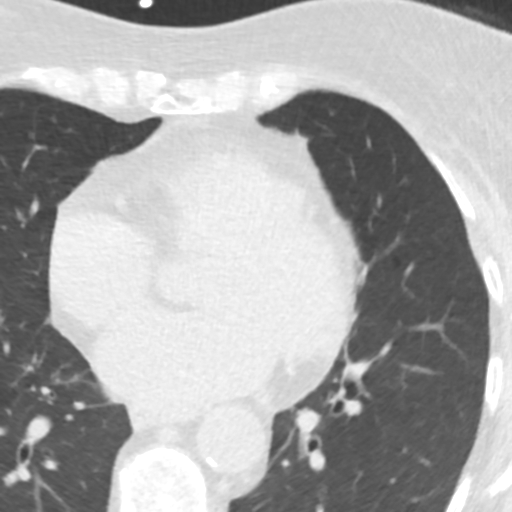
[im 31/46  vessel]
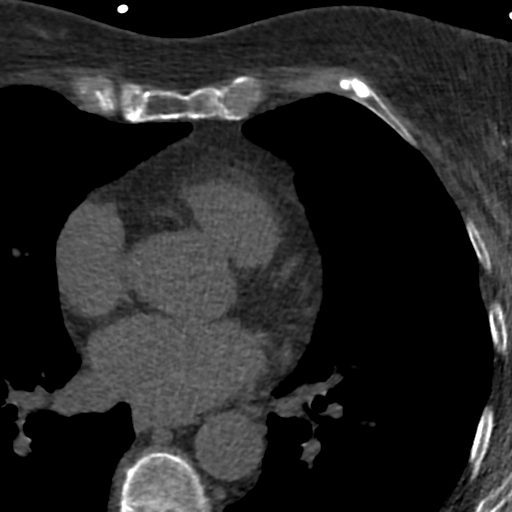
[im 36/46  vessel]
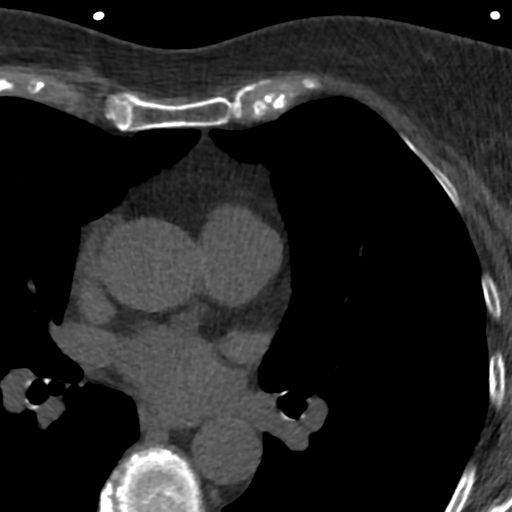
[im 41/46  vessel]
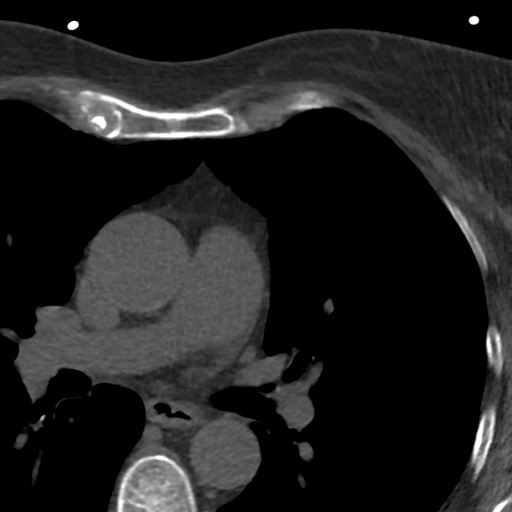

[Series 4: lung st 71 % · axial · 0.65mm/px · z∈[-247,-142]mm · 8 of 46 slices shown]
[im 6/46  lung]
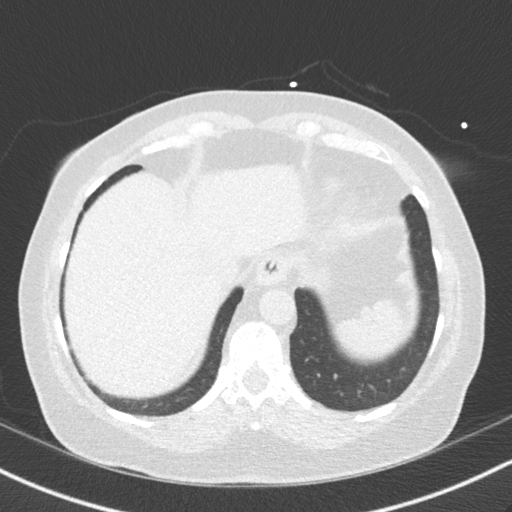
[im 11/46  lung]
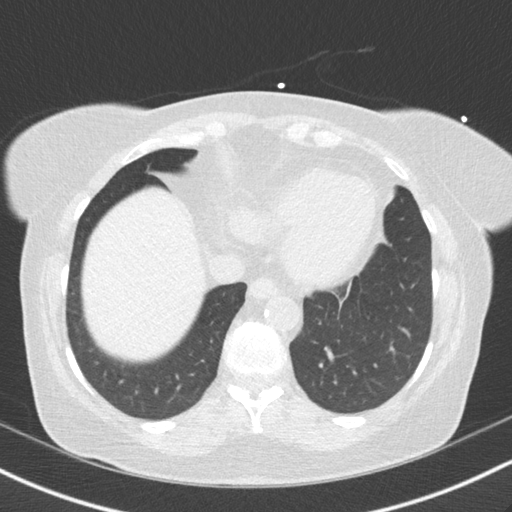
[im 16/46  lung]
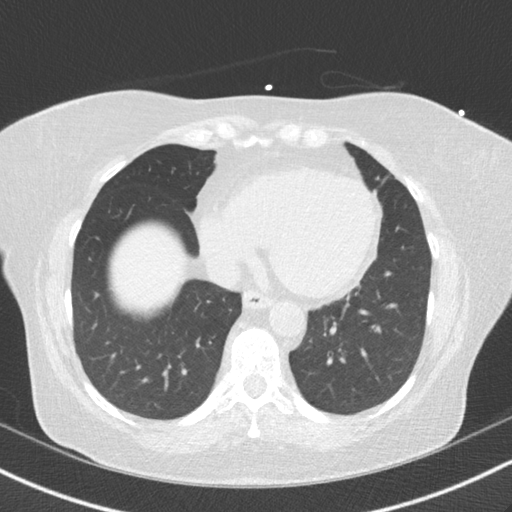
[im 21/46  lung]
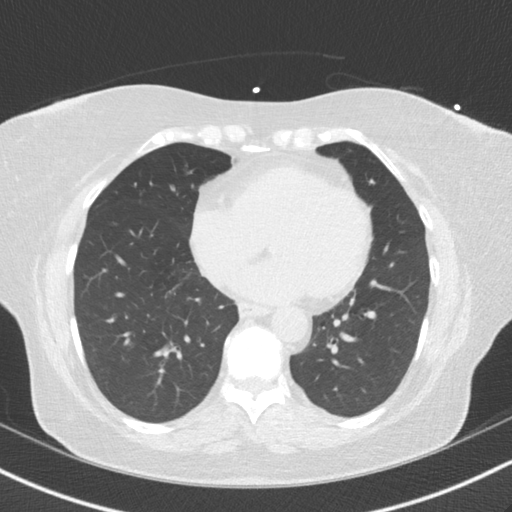
[im 26/46  lung]
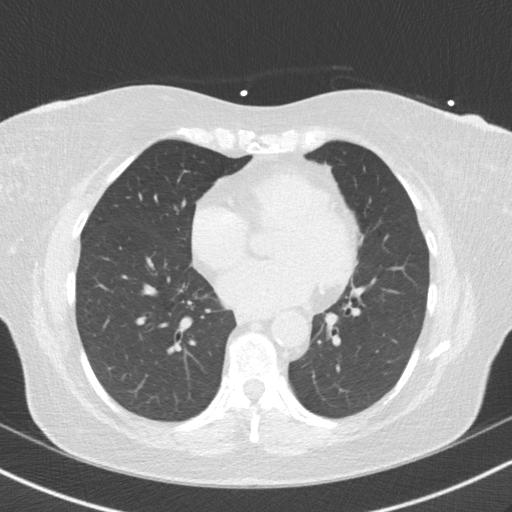
[im 31/46  lung]
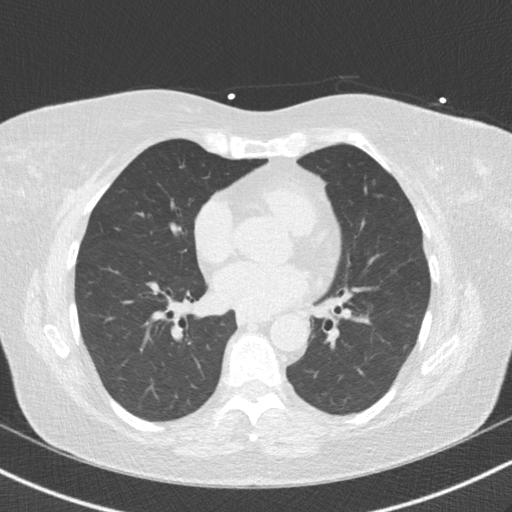
[im 36/46  lung]
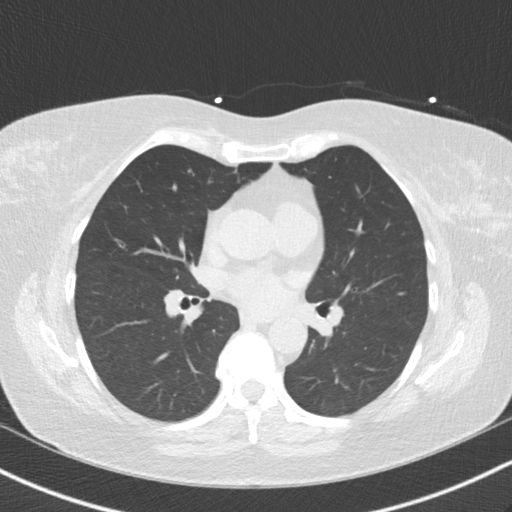
[im 41/46  lung]
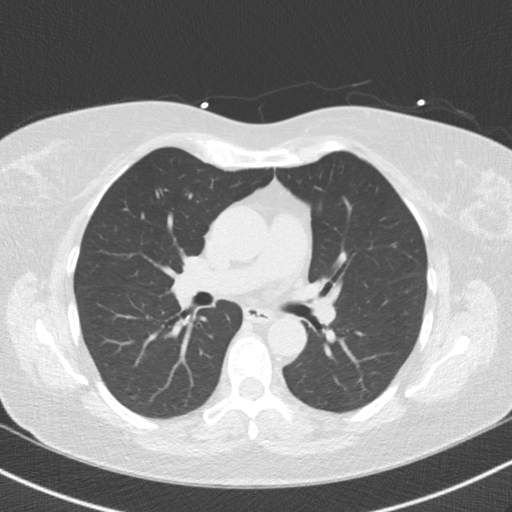

[16 of 20 positions shown; findings below may reference images not displayed]

FINDINGS: Non-cardiac: See separate report from [REDACTED].

Ascending Aorta: Normal diameter 3.5 cm with atherosclerosis

Pericardium: Normal

Coronary arteries: Mild punctate calcium noted in proximal RCA and
LAD
IMPRESSION: Coronary calcium score of 3. This was 49 th percentile for age and
sex matched control.

Hamburguesas Maquinaria

EXAM:
OVER-READ INTERPRETATION  CT CHEST

The following report is an over-read performed by radiologist Dr.
Suha Antillon [REDACTED] on 07/12/2018. This
over-read does not include interpretation of cardiac or coronary
anatomy or pathology. The coronary calcium score interpretation by
the cardiologist is attached.
FINDINGS: Aortic atherosclerosis. Within the visualized portions of the thorax
there are no suspicious appearing pulmonary nodules or masses, there
is no acute consolidative airspace disease, no pleural effusions, no
pneumothorax and no lymphadenopathy. Visualized portions of the
upper abdomen are unremarkable. 11 x 16 mm soft tissue attenuation
nodule in the lateral aspect of the right breast (axial image 1 of
series 4). There are no aggressive appearing lytic or blastic
lesions noted in the visualized portions of the skeleton.
IMPRESSION: 1.  Aortic Atherosclerosis ([id number]).
2. 11 x 16 mm soft tissue attenuation nodule in the lateral aspect
of the right breast. This may simply represent a lymph node,
however, correlation with nonemergent mammography is recommended if
not recently obtained.

## 2019-03-24 ENCOUNTER — Telehealth: Payer: Self-pay

## 2019-03-24 NOTE — Telephone Encounter (Signed)
Spoke with patient she was requesting a refill too early to get Medication early. Pharmacy requested Metoprolol 50 mg. Twice a day. Patient is not taking this way. She has an adequate supply to last through July.   Patient states this new Strength would be too confusing declined this refill,

## 2019-04-04 ENCOUNTER — Ambulatory Visit: Payer: Medicare Other

## 2019-04-25 ENCOUNTER — Telehealth: Payer: Self-pay | Admitting: Pulmonary Disease

## 2019-04-25 DIAGNOSIS — Z9989 Dependence on other enabling machines and devices: Secondary | ICD-10-CM

## 2019-04-25 DIAGNOSIS — G4733 Obstructive sleep apnea (adult) (pediatric): Secondary | ICD-10-CM

## 2019-04-25 NOTE — Telephone Encounter (Signed)
Spoke with pt.  Order sent to [facility] for CPAP supplies.  Instructed pt to call [facility] this afternoon to make sure they received the order.  Told pt to call us back if they need anything else from [place].

## 2019-04-25 NOTE — Telephone Encounter (Addendum)
Spoke with pt and she stated [facility] needed a code [id number].  [facility] already sent the order.  New order placed.  Nothing further is needed.

## 2019-04-28 ENCOUNTER — Telehealth: Payer: Self-pay | Admitting: Pulmonary Disease

## 2019-04-28 NOTE — Telephone Encounter (Signed)
Call made to [facility], per Vaughan Basta she states [phone] needed to be on the order when it is faxed to their office. I explained that an order with code has been sent via epic documentation with that code. She asked that it be re-faxed. Order printed and refaxed with requested code. Fax confirmation received. Nothing further is needed at this time.   Left message informing patient of the above.

## 2019-05-03 ENCOUNTER — Telehealth: Payer: Medicare Other | Admitting: Family

## 2019-05-03 DIAGNOSIS — B9689 Other specified bacterial agents as the cause of diseases classified elsewhere: Secondary | ICD-10-CM

## 2019-05-03 DIAGNOSIS — J019 Acute sinusitis, unspecified: Secondary | ICD-10-CM | POA: Diagnosis not present

## 2019-05-03 MED ORDER — AMOXICILLIN-POT CLAVULANATE 875-125 MG PO TABS: 1.0000 | ORAL_TABLET | Freq: Two times a day (BID) | ORAL | 0 refills | Status: DC

## 2019-05-03 NOTE — Progress Notes (Signed)

## 2019-05-23 ENCOUNTER — Ambulatory Visit: Payer: Medicare Other | Admitting: Internal Medicine

## 2019-05-23 ENCOUNTER — Ambulatory Visit (INDEPENDENT_AMBULATORY_CARE_PROVIDER_SITE_OTHER): Payer: Medicare Other

## 2019-05-23 DIAGNOSIS — Z0000 Encounter for general adult medical examination without abnormal findings: Secondary | ICD-10-CM | POA: Diagnosis not present

## 2019-05-23 NOTE — Progress Notes (Signed)
Subjective:   Angela Mendoza is a 68 y.o. female who presents for Medicare Annual (Subsequent) preventive examination.  Review of Systems:  No ROS.  Medicare Wellness Virtual Visit.  Visual/audio telehealth visit, UTA vital signs.   See social history for additional risk factors.   Cardiac Risk Factors include: advanced age (>55men, >26 women);hypertension     Objective:     Vitals: There were no vitals taken for this visit.  There is no height or weight on file to calculate BMI.  Advanced Directives 05/23/2019 05/20/2018  Does Patient Have a Medical Advance Directive? No No  Would patient like information on creating a medical advance directive? Yes (MAU/Ambulatory/Procedural Areas - Information given) Yes (MAU/Ambulatory/Procedural Areas - Information given)    Tobacco Social History   Tobacco Use  Smoking Status Former Smoker  . Packs/day: 0.30  . Years: 7.00  . Pack years: 2.10  . Types: Cigarettes  . Quit date: 10/27/1973  . 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 Hx of children who are grown   Clinical Intake:  Pre-visit preparation completed: Yes        Diabetes: No  How often do you need to have someone help you when you read instructions, pamphlets, or other written materials from your doctor or pharmacy?: 1 - Never  Interpreter Needed?: No     Past Medical History:  Diagnosis Date  . ABNORMAL HEART RHYTHMS 06/09/2008  . Allergic rhinitis 06/09/2008   chronic  . Asthma, intrinsic 10/06/2011   controlled on qvar. [name] 09/2011:  Very mild airflow obstruction (FEV1% 68, FVL c/w obstruction)   . Chronic headache 06/09/2008  . COPD (chronic obstructive pulmonary disease) ([place])   . Depression   . Diverticulosis 2013   h/o  diverticulitis  . Dry eyes   . GERD (gastroesophageal reflux disease)   . History of breast implant removal    silicone mastitis - R axilla silicone LN, free silicone L breast upper outer quadrant  . Hot flashes   . HYPERLIPIDEMIA 06/09/2008  . HYPERTENSION 06/09/2008  . Hypothyroidism   . MVP (mitral valve prolapse)   . OSA on CPAP    [name]  . Pernicious anemia    per prior pcp  . Rosacea   . Surgical menopause 1991  . Vertigo   . Vitamin D deficiency    Past Surgical History:  Procedure Laterality Date  . APPENDECTOMY  2007  . BREAST BIOPSY Left 09/27/2009  . BREAST ENHANCEMENT SURGERY  2006  . CARDIOVASCULAR STRESS TEST  2013   [facility]ucumber [name] - WNL, EF 55-60%, with 0 calcium score  . CHOLECYSTECTOMY  2007   biliary dyskinesia  . COLONOSCOPY  08/2012   diverticulosis  . COLONOSCOPY  2006   inflamed hyperplastic polyp  . ESOPHAGOGASTRODUODENOSCOPY  08/2012   esophageal reflux  . HERNIA REPAIR    . NASAL SINUS SURGERY    . RECTOCELE REPAIR  12/2008   [name]  . sleep study  08/2007   OSA, 12 cm H2O,  . TOTAL ABDOMINAL HYSTERECTOMY  1991   heavy bleeding, ovaries removed  . TUBAL LIGATION    . US ECHOCARDIOGRAPHY  09/2009   impaired relaxation, mild tric regurg, normal MV, EF 60%  . US ECHOCARDIOGRAPHY  11/2007  mild-mod MR   Family History  Problem Relation Age of Onset  . Cancer Mother 58       lung, passive smoke  . Cancer Father 37       lung, smoker  . Cancer Paternal Aunt        breast, lung  . Cancer Cousin        breast  . Stroke Sister        TIA  . Hypertension Sister   . Heart failure Sister   . Aortic dissection Paternal Uncle   . Heart attack Paternal Uncle   . AAA (abdominal aortic aneurysm) Paternal Uncle   . Aortic dissection Maternal Aunt   . Breast cancer Maternal Aunt   . Heart disease Paternal Uncle   . Stroke Paternal Uncle    Social History   Socioeconomic History  . Marital status: Married    Spouse name: Not on  file  . Number of children: Not on file  . Years of education: Not on file  . Highest education level: Not on file  Occupational History  . Occupation: retired  Scientific laboratory technician  . Financial resource strain: Not hard at all  . Food insecurity    Worry: Never true    Inability: Never true  . Transportation needs    Medical: No    Non-medical: No  Tobacco Use  . Smoking status: Former Smoker    Packs/day: 0.30    Years: 7.00    Pack years: 2.10    Types: Cigarettes    Quit date: 10/27/1973    Years since quitting: 45.6  . Smokeless tobacco: Never Used  . 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 Hx of children who are grown  Substance and Sexual Activity  . Alcohol use: Yes    Alcohol/week: 0.0 standard drinks    Comment: very rare  . Drug use: No  . Sexual activity: Not on file  Lifestyle  . Physical activity    Days per week: 3 days    Minutes per session: 20 min  . Stress: Not at all  Relationships  . Social Herbalist on phone: Not on file    Gets together: Not on file    Attends religious service: Not on file    Active member of club or organization: Not on file    Attends meetings of clubs or organizations: Not on file    Relationship status: Not on file  Other Topics Concern  . Not on file  Social History Narrative   Lives with husband, cat   Grown children   Occ: retired, was Gaffer   Edu: trade degree then 2 yrscollege    Outpatient Encounter Medications as of 05/23/2019  Medication Sig  . albuterol (PROVENTIL HFA) 108 (90 Base) MCG/ACT inhaler Inhale 2 puffs into the lungs every 6 (six) hours as needed.  . ALPRAZolam (XANAX) 0.25 MG tablet TAKE 1 TABLET (0.25 MG TOTAL) BY MOUTH ONCE DAILY AS NEEDED FOR SLEEP.  [name] amoxicillin-clavulanate (AUGMENTIN) 875-125 MG tablet Take 1 tablet by mouth 2 (two) times daily.  . Ascorbic Acid (VITAMIN C WITH ROSE HIPS) 1000 MG tablet Take 1,000 mg by mouth daily.  [name] aspirin 81  MG tablet Take 81 mg by mouth once a week.   [name] atorvastatin (LIPITOR) 40 MG tablet Take 1 tablet (40 mg total) by mouth daily.  . Azelastine-Fluticasone (DYMISTA) 137-50 MCG/ACT SUSP Place 1 spray into the nose 2 (  two) times daily.  . B Complex Vitamins (VITAMIN-B COMPLEX) TABS Take 1 tablet by mouth daily.  . beclomethasone (QVAR REDIHALER) 80 MCG/ACT inhaler Inhale 2 puffs into the lungs 2 (two) times daily.  . Cholecalciferol (VITAMIN D3) 2000 units TABS Take 1 tablet by mouth daily.  . Coenzyme Q10 300 MG CAPS Take 1 capsule by mouth daily.  [name] conjugated estrogens (PREMARIN) vaginal cream Place 1 Applicatorful vaginally 2 (two) times a week. 90 day supply,  Refill for one year  . cyanocobalamin (,VITAMIN B-12,) 1000 MCG/ML injection Inject 1 mL (1,000 mcg total) into the muscle every 30 (thirty) days.  . cycloSPORINE (RESTASIS) 0.05 % ophthalmic emulsion Place 1 drop into both eyes 2 (two) times daily.  . diazepam (VALIUM) 5 MG tablet TAKE 1 TABLET BY MOUTH EVERY 6 HOURS AS NEEDED FOR VERTIGO  . Docusate Calcium (STOOL SOFTENER PO) Take 1 tablet by mouth as needed.  [name] estradiol (VIVELLE-DOT) 0.1 MG/24HR patch Place 1 patch (0.1 mg total) onto the skin 2 (two) times a week.  . famotidine (PEPCID) 20 MG tablet Take 1 tablet (20 mg total) by mouth daily as needed for heartburn or indigestion.  . fexofenadine (ALLEGRA ALLERGY) 180 MG tablet Take 1 tablet (180 mg total) by mouth daily.  [name] levothyroxine (SYNTHROID) 50 MCG tablet Take 1 tablet (50 mcg total) by mouth daily before breakfast.  . losartan (COZAAR) 25 MG tablet Take 2 tablets (50 mg) by mouth once daily  . MAGNESIUM PO Take 250 mg by mouth daily.  . meclizine (ANTIVERT) 25 MG tablet Take 1 tablet (25 mg total) by mouth 3 (three) times daily as needed.  . metoprolol succinate (TOPROL-XL) 25 MG 24 hr tablet Take 2-3 tablets (50-75 mg total) by mouth daily. Take two tablets by mouth daily may take additional 25 mg tablet for palpitations or  increased BP.  . montelukast (SINGULAIR) 10 MG tablet Take 1 tablet (10 mg total) by mouth at bedtime.  . Multiple Vitamin (MULTIVITAMIN) tablet Take 1 tablet by mouth daily.  [name] omega-3 acid ethyl esters (LOVAZA) 1 g capsule Take 1 capsule (1 g total) by mouth daily.  . ondansetron (ZOFRAN ODT) 4 MG disintegrating tablet Take 1 tablet (4 mg total) by mouth every 8 (eight) hours as needed for nausea or vomiting.  . promethazine (PHENERGAN) 25 MG tablet Take 1 tablet (25 mg total) by mouth every 6 (six) hours as needed for nausea or vomiting.  [name] Propylene Glycol (SYSTANE BALANCE) 0.6 % SOLN Apply 1 drop to eye as needed.  . psyllium (METAMUCIL) 58.6 % powder Take 1 packet by mouth as needed.  [name] Spacer/Aero-Holding Chambers (AEROCHAMBER MV) inhaler Use as instructed  . Syringe/Needle, Disp, (SYRINGE 3CC/20GX1") 20G X 1" 3 ML MISC 1 Syringe by Does not apply route every 30 (thirty) days. For use with b-12 injection monthly  . tretinoin (RETIN-A) 0.05 % cream APPLY TOPICALLY AT BEDTIME  . urea (CARMOL) 10 % cream Apply topically as needed.  . [DISCONTINUED] PARoxetine (PAXIL) 10 MG tablet Take 1 tablet (10 mg total) by mouth daily.  . [DISCONTINUED] PARoxetine (PAXIL) 10 MG tablet TAKE 1 TABLET BY MOUTH EVERY DAY   No facility-administered encounter medications on file as of 05/23/2019.     Activities of Daily Living In your present state of health, do you have any difficulty performing the following activities: 05/23/2019  Hearing? N  Vision? N  Difficulty concentrating or making decisions? N  Walking or climbing stairs? N  Dressing or  bathing? N  Doing errands, shopping? N  Preparing Food and eating ? N  Using the Toilet? N  In the past six months, have you accidently leaked urine? N  Do you have problems with loss of bowel control? N  Managing your Medications? N  Managing your Finances? N  Housekeeping or managing your Housekeeping? N  Some recent data might be hidden    Patient Care  Team: Crecencio Mc, MD as PCP - General (Internal Medicine)    Assessment:   This is a routine wellness examination for Nyella.  I connected with patient 05/23/19 at 10:00 AM EDT by an audio enabled telemedicine application and verified that I am speaking with the correct person using two identifiers. Patient stated full name and DOB. Patient gave permission to continue with virtual visit. Patient's location was at home and Nurse's location was at [place] office.   Health Screenings  Mammogram - 03/2018. Last ordered; 11/2018. She plans to schedule.  Colonoscopy - 08/2012 Bone Density - 10/2008 Glaucoma -none Hearing -demonstrates normal hearing during visit. Labs followed by pcp Cholesterol - 04/2018 Hepatitis C Screening- 05/2015 Dental- [facility]- visits within the last 12 months.  Social  Alcohol intake - yes      Smoking history- former    Smokers in home? none Illicit drug use? none Exercise - stretching, bending, climbing stairs Diet - High fiber, distilled water BMI- discussed the importance of a healthy diet, water intake and the benefits of aerobic exercise.  Educational material provided.   Safety  Patient feels safe at home- yes Patient does have smoke detectors at home- yes Patient does wear sunscreen or protective clothing when in direct sunlight -yes Patient does wear seat belt when in a moving vehicle -yes  Covid-19 precautions and sickness symptoms discussed.   Activities of Daily Living Patient denies needing assistance with: driving, household chores, feeding themselves, getting from bed to chair, getting to the toilet, bathing/showering, dressing, managing money, or preparing meals.  No new identified risk were noted.    CPAP in use at bedtime.   Depression Screen Patient denies losing interest in daily life, feeling hopeless, or crying easily over simple problems.   Medication-taking as directed and without issues.   Fall Screen Patient denies  being afraid of falling or falling in the last year.   Memory Screen Patient is alert.  Patient denies difficulty focusing, concentrating or misplacing items. Correctly identified the president of the [place], season and recall. Patient likes to read, complete crossword puzzles, and explore new recipes for brain stimulation.  Immunizations The following Immunizations were discussed: Influenza, shingles, pneumonia, and tetanus.   Other Providers Patient Care Team: Crecencio Mc, MD as PCP - General (Internal Medicine)  Exercise Activities and Dietary recommendations Current Exercise Habits: Home exercise routine, Type of exercise: walking, Time (Minutes): 20, Frequency (Times/Week): 3, Weekly Exercise (Minutes/Week): 60, Intensity: Mild  Goals      Patient Stated   . Increase physical activity (pt-stated)     Raise heart rate while walking in place  Cut back on carb intake      Other   . Low carb diet     Portion control       Fall Risk Fall Risk  05/23/2019 05/20/2018 05/14/2017 06/23/2016  Falls in the past year? 0 Yes Yes No  Number falls in past yr: - 2 or more 1 -  Comment - Follow[facility] by [facility] - -  Injury with Fall? - Yes  Yes -  Comment - Sprained her ankle stepping the wrong way.  - -  Follow up - Education provided;Falls prevention discussed Falls evaluation completed -  Is the patient's home free of loose throw rugs in walkways, pet beds, electrical cords, etc? yes      Grab bars in the bathroom? yes      Handrails on the stairs?  yes      Adequate lighting?  yes  Depression Screen PHQ 2/9 Scores 05/23/2019 05/20/2018 05/14/2017 06/23/2016  PHQ - 2 Score 0 0 0 0     Cognitive Function MMSE - Mini Mental State Exam 05/20/2018  Orientation to time 5  Orientation to Place 5  Registration 3  Attention/ Calculation 5  Recall 3  Language- name 2 objects 2  Language- repeat 1  Language- follow 3 step command 3  Language- read & follow direction 1  Write a  sentence 1  Copy design 1  Total score 30     6CIT Screen 05/23/2019  What Year? 0 points  What month? 0 points  What time? 0 points  Count back from 20 0 points  Months in reverse 0 points  Repeat phrase 0 points  Total Score 0    Immunization History  Administered Date(s) Administered  . Influenza Split 07/15/2013, 08/04/2017  . Influenza Whole 07/27/2009, 07/28/2011, 07/21/2012  . Influenza, High Dose Seasonal PF 07/22/2017, 07/01/2018  . Influenza,inj,Quad PF,6+ Mos 08/03/2014, 07/12/2015, 07/14/2016  . Influenza-Unspecified 07/15/2013, 08/03/2014, 07/12/2015, 07/14/2016, 08/04/2017  . Pneumococcal Conjugate-13 11/08/2014  . Pneumococcal Polysaccharide-23 07/27/2009, 12/27/2015  . Td 10/27/2008  . Tdap 12/19/2013, 02/25/2017  . Tetanus 10/27/2008  . Zoster 10/27/2009   Screening Tests Health Maintenance  Topic Date Due  . INFLUENZA VACCINE  05/28/2019  . MAMMOGRAM  04/01/2020  . COLONOSCOPY  09/02/2022  . TETANUS/TDAP  02/26/2027  . DEXA SCAN  Completed  . Hepatitis C Screening  Completed  . PNA vac Low Risk Adult  Completed      Plan:    End of life planning; Advance aging; Advanced directives discussed.  Copy of current HCPOA/Living Will requested upon completion.    I have personally reviewed and noted the following in the patient's chart:   . Medical and social history . Use of alcohol, tobacco or illicit drugs  . Current medications and supplements . Functional ability and status . Nutritional status . Physical activity . Advanced directives . List of other physicians . Hospitalizations, surgeries, and ER visits in previous 12 months . Vitals . Screenings to include cognitive, depression, and falls . Referrals and appointments  In addition, I have reviewed and discussed with patient certain preventive protocols, quality metrics, and best practice recommendations. A written personalized care plan for preventive services as well as general preventive  health recommendations were provided to patient.     Varney Biles, LPN  [id number]

## 2019-05-23 NOTE — Patient Instructions (Addendum)
  Angela Mendoza , Thank you for taking time to come for your Medicare Wellness Visit. I appreciate your ongoing commitment to your health goals. Please review the following plan we discussed and let me know if I can assist you in the future.   These are the goals we discussed: Goals      Patient Stated   . Increase physical activity (pt-stated)     Raise heart rate while walking in place  Cut back on carb intake      Other   . Low carb diet     Portion control       This is a list of the screening recommended for you and due dates:  Health Maintenance  Topic Date Due  . Flu Shot  05/28/2019  . Mammogram  04/01/2020  . Colon Cancer Screening  09/02/2022  . Tetanus Vaccine  02/26/2027  . DEXA scan (bone density measurement)  Completed  .  Hepatitis C: One time screening is recommended by Center for Disease Control  (CDC) for  adults born from [birth year] through 1965.   Completed  . Pneumonia vaccines  Completed

## 2019-05-27 ENCOUNTER — Ambulatory Visit (INDEPENDENT_AMBULATORY_CARE_PROVIDER_SITE_OTHER): Payer: Medicare Other | Admitting: Internal Medicine

## 2019-05-27 ENCOUNTER — Encounter: Payer: Self-pay | Admitting: Internal Medicine

## 2019-05-27 ENCOUNTER — Other Ambulatory Visit: Payer: Self-pay

## 2019-05-27 DIAGNOSIS — J45909 Unspecified asthma, uncomplicated: Secondary | ICD-10-CM

## 2019-05-27 DIAGNOSIS — F419 Anxiety disorder, unspecified: Secondary | ICD-10-CM

## 2019-05-27 DIAGNOSIS — I10 Essential (primary) hypertension: Secondary | ICD-10-CM | POA: Diagnosis not present

## 2019-05-27 DIAGNOSIS — K2100 Gastro-esophageal reflux disease with esophagitis, without bleeding: Secondary | ICD-10-CM

## 2019-05-27 DIAGNOSIS — J3089 Other allergic rhinitis: Secondary | ICD-10-CM | POA: Diagnosis not present

## 2019-05-27 DIAGNOSIS — E559 Vitamin D deficiency, unspecified: Secondary | ICD-10-CM | POA: Diagnosis not present

## 2019-05-27 DIAGNOSIS — G4733 Obstructive sleep apnea (adult) (pediatric): Secondary | ICD-10-CM

## 2019-05-27 DIAGNOSIS — J302 Other seasonal allergic rhinitis: Secondary | ICD-10-CM

## 2019-05-27 DIAGNOSIS — E034 Atrophy of thyroid (acquired): Secondary | ICD-10-CM | POA: Diagnosis not present

## 2019-05-27 DIAGNOSIS — Z9989 Dependence on other enabling machines and devices: Secondary | ICD-10-CM

## 2019-05-27 MED ORDER — ATORVASTATIN CALCIUM 40 MG PO TABS: 40.0000 mg | ORAL_TABLET | Freq: Every day | ORAL | 3 refills | Status: DC

## 2019-05-27 MED ORDER — METOPROLOL SUCCINATE ER 25 MG PO TB24: 50.0000 mg | ORAL_TABLET | Freq: Every day | ORAL | 3 refills | Status: DC

## 2019-05-27 MED ORDER — FAMOTIDINE 20 MG PO TABS: 20.0000 mg | ORAL_TABLET | Freq: Every day | ORAL | 3 refills | Status: DC | PRN

## 2019-05-27 MED ORDER — TRETINOIN 0.05 % EX CREA: TOPICAL_CREAM | Freq: Every day | CUTANEOUS | 1 refills | Status: DC

## 2019-05-27 MED ORDER — "SYRINGE 20G X 1"" 3 ML MISC": 1.0000 | 0 refills | Status: DC

## 2019-05-27 MED ORDER — ONDANSETRON 4 MG PO TBDP: 4.0000 mg | ORAL_TABLET | Freq: Three times a day (TID) | ORAL | 0 refills | Status: DC | PRN

## 2019-05-27 MED ORDER — ESTROGENS, CONJUGATED 0.625 MG/GM VA CREA: 1.0000 | TOPICAL_CREAM | VAGINAL | 3 refills | Status: DC

## 2019-05-27 MED ORDER — ESTRADIOL 0.1 MG/24HR TD PTTW: 1.0000 | MEDICATED_PATCH | TRANSDERMAL | 3 refills | Status: DC

## 2019-05-27 MED ORDER — ALBUTEROL SULFATE HFA 108 (90 BASE) MCG/ACT IN AERS: 2.0000 | INHALATION_SPRAY | Freq: Four times a day (QID) | RESPIRATORY_TRACT | 2 refills | Status: DC | PRN

## 2019-05-27 MED ORDER — CYANOCOBALAMIN 1000 MCG/ML IJ SOLN: 1000.0000 ug | INTRAMUSCULAR | 3 refills | Status: DC

## 2019-05-27 MED ORDER — UREA 10 % EX CREA: TOPICAL_CREAM | CUTANEOUS | 3 refills | Status: DC | PRN

## 2019-05-27 MED ORDER — QVAR REDIHALER 80 MCG/ACT IN AERB: 2.0000 | INHALATION_SPRAY | Freq: Two times a day (BID) | RESPIRATORY_TRACT | 3 refills | Status: DC

## 2019-05-27 MED ORDER — CYCLOSPORINE 0.05 % OP EMUL: 1.0000 [drp] | Freq: Two times a day (BID) | OPHTHALMIC | 3 refills | Status: DC

## 2019-05-27 MED ORDER — ALPRAZOLAM 0.25 MG PO TABS: ORAL_TABLET | ORAL | 2 refills | Status: DC

## 2019-05-27 MED ORDER — MONTELUKAST SODIUM 10 MG PO TABS: 10.0000 mg | ORAL_TABLET | Freq: Every day | ORAL | 3 refills | Status: DC

## 2019-05-27 MED ORDER — MECLIZINE HCL 25 MG PO TABS: 25.0000 mg | ORAL_TABLET | Freq: Three times a day (TID) | ORAL | 3 refills | Status: DC | PRN

## 2019-05-27 MED ORDER — DIAZEPAM 5 MG PO TABS: ORAL_TABLET | ORAL | 2 refills | Status: DC

## 2019-05-27 MED ORDER — AZELASTINE-FLUTICASONE 137-50 MCG/ACT NA SUSP: 1.0000 | Freq: Two times a day (BID) | NASAL | Status: DC

## 2019-05-27 NOTE — Progress Notes (Signed)
Virtual Visit via [email or web address]  This visit type was conducted due to national recommendations for restrictions regarding the COVID-19 pandemic (e.g. social distancing).  This format is felt to be most appropriate for this patient at this time.  All issues noted in this document were discussed and addressed.  No physical exam was performed (except for noted visual exam findings with Video Visits).   I connected with@ on 05/27/19 at  4:00 PM EDT by a video enabled telemedicine application and verified that I am speaking with the correct person using two identifiers. Location patient: home Location provider: work or home office Persons participating in the virtual visit: patient, provider  I discussed the limitations, risks, security and privacy concerns of performing an evaluation and management service by telephone and the availability of in person appointments. I also discussed with the patient that there may be a patient responsible charge related to this service. The patient expressed understanding and agreed to proceed.  Reason for visit: follow up   HPI:   67 yr old female with history of asthma, hypertension, GAD and PAF presents for follow up on these an other problems  The patient has no signs or symptoms of COVID 19 infection (fever, cough, sore throat  or shortness of breath beyond what is typical for patient).  Patient has been sheltering at home sine February due to overwhelming fear of infection and denies contact with other persons with the above mentioned symptoms or with anyone confirmed to have COVID 19 . GAD:  She stopped taking paxil after about 6 weeks f therapy and has been restricting her use of xanax .  Her valium prescription has expired,; she uses it for episodes of vertigo.   ROS: See pertinent positives and negatives per HPI.  Past Medical History:  Diagnosis Date  . ABNORMAL HEART RHYTHMS 06/09/2008  . Allergic rhinitis 06/09/2008   chronic  . Asthma, intrinsic  10/06/2011   controlled on qvar. [name] 09/2011:  Very mild airflow obstruction (FEV1% 68, FVL c/w obstruction)   . Chronic headache 06/09/2008  . COPD (chronic obstructive pulmonary disease) ([place])   . Depression   . Diverticulosis 2013   h/o diverticulitis  . Dry eyes   . GERD (gastroesophageal reflux disease)   . History of breast implant removal    silicone mastitis - R axilla silicone LN, free silicone L breast upper outer quadrant  . Hot flashes   . HYPERLIPIDEMIA 06/09/2008  . HYPERTENSION 06/09/2008  . Hypothyroidism   . MVP (mitral valve prolapse)   . OSA on CPAP    [name]  . Pernicious anemia    per prior pcp  . Rosacea   . Surgical menopause 1991  . Vertigo   . Vitamin D deficiency     Past Surgical History:  Procedure Laterality Date  . APPENDECTOMY  2007  . BREAST BIOPSY Left 09/27/2009  . BREAST ENHANCEMENT SURGERY  2006  . CARDIOVASCULAR STRESS TEST  2013   [place] [name] - WNL, EF 55-60%, with 0 calcium score  . CHOLECYSTECTOMY  2007   biliary dyskinesia  . COLONOSCOPY  08/2012   diverticulosis  . COLONOSCOPY  2006   inflamed hyperplastic polyp  . ESOPHAGOGASTRODUODENOSCOPY  08/2012   esophageal reflux  . HERNIA REPAIR    . NASAL SINUS SURGERY    . RECTOCELE REPAIR  12/2008   [name]  . sleep study  08/2007   OSA, 12 cm H2O,  . TOTAL ABDOMINAL HYSTERECTOMY  1991   heavy  bleeding, ovaries removed  . TUBAL LIGATION    . US ECHOCARDIOGRAPHY  09/2009   impaired relaxation, mild tric regurg, normal MV, EF 60%  . US ECHOCARDIOGRAPHY  11/2007   mild-mod MR    Family History  Problem Relation Age of Onset  . Cancer Mother 31       lung, passive smoke  . Cancer Father 61       lung, smoker  . Cancer Paternal Aunt        breast, lung  . Cancer Cousin        breast  . Stroke Sister        TIA  . Hypertension Sister   . Heart failure Sister   . Aortic dissection Paternal Uncle   . Heart attack Paternal Uncle   . AAA (abdominal aortic  aneurysm) Paternal Uncle   . Aortic dissection Maternal Aunt   . Breast cancer Maternal Aunt   . Heart disease Paternal Uncle   . Stroke Paternal Uncle     SOCIAL HX:  reports that she quit smoking about 45 years ago. Her smoking use included cigarettes. She has a 2.10 pack-year smoking history. She has never used smokeless tobacco. She reports current alcohol use. She reports that she does not use drugs.   Current Outpatient Medications:  .  albuterol (PROVENTIL HFA) 108 (90 Base) MCG/ACT inhaler, Inhale 2 puffs into the lungs every 6 (six) hours as needed., Disp: 18 g, Rfl: 2 .  ALPRAZolam (XANAX) 0.25 MG tablet, TAKE 1 TABLET (0.25 MG TOTAL) BY MOUTH ONCE DAILY AS NEEDED FOR SLEEP., Disp: 30 tablet, Rfl: 2 .  Ascorbic Acid (VITAMIN C WITH ROSE HIPS) 1000 MG tablet, Take 1,000 mg by mouth daily., Disp: , Rfl:  .  aspirin 81 MG tablet, Take 81 mg by mouth once a week. , Disp: , Rfl:  .  atorvastatin (LIPITOR) 40 MG tablet, Take 1 tablet (40 mg total) by mouth daily., Disp: 90 tablet, Rfl: 3 .  Azelastine-Fluticasone (DYMISTA) 137-50 MCG/ACT SUSP, Place 1 spray into the nose 2 (two) times daily., Disp: 23 g, Rfl: g .  B Complex Vitamins (VITAMIN-B COMPLEX) TABS, Take 1 tablet by mouth daily., Disp: , Rfl:  .  beclomethasone (QVAR REDIHALER) 80 MCG/ACT inhaler, Inhale 2 puffs into the lungs 2 (two) times daily., Disp: 10.6 g, Rfl: 3 .  Cholecalciferol (VITAMIN D3) 2000 units TABS, Take 1 tablet by mouth daily., Disp: 90 tablet, Rfl: 3 .  Coenzyme Q10 300 MG CAPS, Take 1 capsule by mouth daily., Disp: , Rfl:  .  [START ON 05/30/2019] conjugated estrogens (PREMARIN) vaginal cream, Place 1 Applicatorful vaginally 2 (two) times a week. 90 day supply,  Refill for one year, Disp: 90 g, Rfl: 3 .  cyanocobalamin (,VITAMIN B-12,) 1000 MCG/ML injection, Inject 1 mL (1,000 mcg total) into the muscle every 30 (thirty) days., Disp: 3 mL, Rfl: 3 .  cycloSPORINE (RESTASIS) 0.05 % ophthalmic emulsion, Place 1  drop into both eyes 2 (two) times daily., Disp: 3 each, Rfl: 3 .  diazepam (VALIUM) 5 MG tablet, TAKE 1 TABLET BY MOUTH EVERY 6 HOURS AS NEEDED FOR VERTIGO, Disp: 30 tablet, Rfl: 2 .  Docusate Calcium (STOOL SOFTENER PO), Take 1 tablet by mouth as needed., Disp: , Rfl:  .  EPINEPHrine 0.3 mg/0.3 mL IJ SOAJ injection, Inject 0.3 mg into the muscle as needed for anaphylaxis., Disp: , Rfl:  .  [START ON 05/30/2019] estradiol (VIVELLE-DOT) 0.1 MG/24HR patch, Place 1 patch (  0.1 mg total) onto the skin 2 (two) times a week., Disp: 24 patch, Rfl: 3 .  famotidine (PEPCID) 20 MG tablet, Take 1 tablet (20 mg total) by mouth daily as needed for heartburn or indigestion., Disp: 90 tablet, Rfl: 3 .  fexofenadine (ALLEGRA ALLERGY) 180 MG tablet, Take 1 tablet (180 mg total) by mouth daily., Disp: 90 tablet, Rfl: 3 .  levothyroxine (SYNTHROID) 50 MCG tablet, Take 1 tablet (50 mcg total) by mouth daily before breakfast., Disp: 90 tablet, Rfl: 3 .  losartan (COZAAR) 25 MG tablet, Take 2 tablets (50 mg) by mouth once daily, Disp: 180 tablet, Rfl: 2 .  MAGNESIUM PO, Take 250 mg by mouth daily., Disp: , Rfl:  .  meclizine (ANTIVERT) 25 MG tablet, Take 1 tablet (25 mg total) by mouth 3 (three) times daily as needed., Disp: 270 tablet, Rfl: 3 .  metoprolol succinate (TOPROL-XL) 25 MG 24 hr tablet, Take 2-3 tablets (50-75 mg total) by mouth daily. Take two tablets by mouth daily may take additional 25 mg tablet for palpitations or increased BP., Disp: 270 tablet, Rfl: 3 .  montelukast (SINGULAIR) 10 MG tablet, Take 1 tablet (10 mg total) by mouth at bedtime., Disp: 90 tablet, Rfl: 3 .  Multiple Vitamin (MULTIVITAMIN) tablet, Take 1 tablet by mouth daily., Disp: , Rfl:  .  omega-3 acid ethyl esters (LOVAZA) 1 g capsule, Take 1 capsule (1 g total) by mouth daily., Disp: 90 capsule, Rfl: 3 .  ondansetron (ZOFRAN ODT) 4 MG disintegrating tablet, Take 1 tablet (4 mg total) by mouth every 8 (eight) hours as needed for nausea or  vomiting., Disp: 20 tablet, Rfl: 0 .  Propylene Glycol (SYSTANE BALANCE) 0.6 % SOLN, Apply 1 drop to eye as needed., Disp: , Rfl:  .  psyllium (METAMUCIL) 58.6 % powder, Take 1 packet by mouth as needed., Disp: , Rfl:  .  Spacer/Aero-Holding Chambers (AEROCHAMBER MV) inhaler, Use as instructed, Disp: 1 each, Rfl: 3 .  Syringe/Needle, Disp, (SYRINGE 3CC/20GX1") 20G X 1" 3 ML MISC, 1 Syringe by Does not apply route every 30 (thirty) days. For use with b-12 injection monthly, Disp: 50 each, Rfl: 0 .  tretinoin (RETIN-A) 0.05 % cream, Apply topically at bedtime., Disp: 45 g, Rfl: 1 .  urea (CARMOL) 10 % cream, Apply topically as needed., Disp: 90 g, Rfl: 3  EXAM:  VITALS per patient if applicable:  GENERAL: alert, oriented, appears well and in no acute distress  HEENT: atraumatic, conjunttiva clear, no obvious abnormalities on inspection of external nose and ears  NECK: normal movements of the head and neck  LUNGS: on inspection no signs of respiratory distress, breathing rate appears normal, no obvious gross SOB, gasping or wheezing  CV: no obvious cyanosis  MS: moves all visible extremities without noticeable abnormality  PSYCH/NEURO: pleasant and cooperative, no obvious depression or anxiety, speech and thought processing grossly intact  ASSESSMENT AND PLAN:  Asthma, intrinsic Managed with Pulmonology .  No recent exacerbations.  Continue aggressive management of GERD and allergic rhinitis and continue "shelter at home" precautions during COVID 19 epidemic   Anxiety Improved , now managing with prn alprazolam and no Paxil  Seasonal and perennial allergic rhinitis Continue antihistamine ,  May increase to bid if needed,  Add saline irrigations after any outside exposure.   OSA on CPAP Diagnosed by sleep study. She is wearing her CPAP every night a minimum of 6 hours per night and notes improved daytime wakefulness and decreased fatigue  Hypothyroidism Thyroid function was  normal last May on  NAME BRAND ONLY SYNTHROID 50  MCG . She does not want to leave home for testing but has no signs of symptoms of malfunctioning thyroid  Lab Results  Component Value Date   TSH 2.26 03/17/2018       I discussed the assessment and treatment plan with the patient. The patient was provided an opportunity to ask questions and all were answered. The patient agreed with the plan and demonstrated an understanding of the instructions.   The patient was advised to call back or seek an in-person evaluation if the symptoms worsen or if the condition fails to improve as anticipated. I provided 25 minutes of non-face-to-face time during this encounter.   Crecencio Mc, MD

## 2019-05-29 NOTE — Assessment & Plan Note (Signed)
Diagnosed by sleep study. She is wearing her CPAP every night a minimum of 6 hours per night and notes improved daytime wakefulness and decreased fatigue  

## 2019-05-29 NOTE — Assessment & Plan Note (Signed)
Continue antihistamine ,  May increase to bid if needed,  Add saline irrigations after any outside exposure.

## 2019-05-29 NOTE — Assessment & Plan Note (Signed)
Improved , now managing with prn alprazolam and no Paxil

## 2019-05-29 NOTE — Assessment & Plan Note (Signed)
Thyroid function was normal last May on  NAME BRAND ONLY SYNTHROID 50  MCG . She does not want to leave home for testing but has no signs of symptoms of malfunctioning thyroid  Lab Results  Component Value Date   TSH 2.26 03/17/2018

## 2019-05-29 NOTE — Assessment & Plan Note (Signed)
Managed with Pulmonology .  No recent exacerbations.  Continue aggressive management of GERD and allergic rhinitis and continue "shelter at home" precautions during COVID 19 epidemic

## 2019-06-01 ENCOUNTER — Other Ambulatory Visit: Payer: Self-pay

## 2019-06-01 ENCOUNTER — Other Ambulatory Visit: Payer: Self-pay | Admitting: Internal Medicine

## 2019-06-01 DIAGNOSIS — I10 Essential (primary) hypertension: Secondary | ICD-10-CM

## 2019-06-01 MED ORDER — METOPROLOL SUCCINATE ER 25 MG PO TB24: 50.0000 mg | ORAL_TABLET | Freq: Every day | ORAL | 3 refills | Status: DC

## 2019-06-01 MED ORDER — LOSARTAN POTASSIUM 50 MG PO TABS: 50.0000 mg | ORAL_TABLET | Freq: Every day | ORAL | 1 refills | Status: DC

## 2019-06-01 MED ORDER — AZELASTINE-FLUTICASONE 137-50 MCG/ACT NA SUSP: 1.0000 | Freq: Two times a day (BID) | NASAL | 3 refills | Status: DC

## 2019-06-05 ENCOUNTER — Other Ambulatory Visit: Payer: Self-pay | Admitting: Internal Medicine

## 2019-06-05 MED ORDER — ONDANSETRON 4 MG PO TBDP: 4.0000 mg | ORAL_TABLET | Freq: Three times a day (TID) | ORAL | 2 refills | Status: DC | PRN

## 2019-06-05 MED ORDER — LOSARTAN POTASSIUM 50 MG PO TABS: 50.0000 mg | ORAL_TABLET | Freq: Every day | ORAL | 1 refills | Status: DC

## 2019-06-09 ENCOUNTER — Telehealth: Payer: Self-pay | Admitting: Pulmonary Disease

## 2019-06-09 NOTE — Telephone Encounter (Signed)
Dr Halford Chessman, pt asking when you recommend she get the flu vaccine- Sept or Oct? Please advise thanks

## 2019-06-10 NOTE — Telephone Encounter (Signed)
Called and spoke with pt letting her know the info stated by VS in regards to the flu vaccine. Pt verbalized understanding. Nothing further needed.

## 2019-06-10 NOTE — Telephone Encounter (Signed)
Either September or October would be fine.  The main thing is to get the vaccine at least several weeks before the typical flu season begins - usually in November/December time frame.  It typical takes about a couple of weeks for the body to produces immune response after getting vaccination, but then the effects should last through the flu season.  So really it is a matter of convenience whether she gets the flu shot in September or October.

## 2019-06-22 ENCOUNTER — Other Ambulatory Visit: Payer: Self-pay

## 2019-06-22 ENCOUNTER — Other Ambulatory Visit (INDEPENDENT_AMBULATORY_CARE_PROVIDER_SITE_OTHER): Payer: Medicare Other

## 2019-06-22 DIAGNOSIS — E559 Vitamin D deficiency, unspecified: Secondary | ICD-10-CM | POA: Diagnosis not present

## 2019-06-22 DIAGNOSIS — E782 Mixed hyperlipidemia: Secondary | ICD-10-CM | POA: Diagnosis not present

## 2019-06-22 DIAGNOSIS — I10 Essential (primary) hypertension: Secondary | ICD-10-CM | POA: Diagnosis not present

## 2019-06-22 DIAGNOSIS — E034 Atrophy of thyroid (acquired): Secondary | ICD-10-CM | POA: Diagnosis not present

## 2019-06-22 LAB — LIPID PANEL
Cholesterol: 164 mg/dL (ref 0–200)
HDL: 55.9 mg/dL (ref >39.00)
LDL Cholesterol: 76 mg/dL (ref 0–99)
NonHDL: 108.06
Total CHOL/HDL Ratio: 3
Triglycerides: 159 mg/dL — ABNORMAL HIGH (ref 0.0–149.0)
VLDL: 31.8 mg/dL (ref 0.0–40.0)

## 2019-06-22 LAB — COMPREHENSIVE METABOLIC PANEL
ALT: 33 U/L (ref 0–35)
AST: 26 U/L (ref 0–37)
Albumin: 4.4 g/dL (ref 3.5–5.2)
Alkaline Phosphatase: 60 U/L (ref 39–117)
BUN: 9 mg/dL (ref 6–23)
CO2: 29 mEq/L (ref 19–32)
Calcium: 9.4 mg/dL (ref 8.4–10.5)
Chloride: 103 mEq/L (ref 96–112)
Creatinine, Ser: 0.84 mg/dL (ref 0.40–1.20)
GFR: 67.31 mL/min (ref >60.00)
Glucose, Bld: 98 mg/dL (ref 70–99)
Potassium: 4.5 mEq/L (ref 3.5–5.1)
Sodium: 140 mEq/L (ref 135–145)
Total Bilirubin: 0.7 mg/dL (ref 0.2–1.2)
Total Protein: 7.1 g/dL (ref 6.0–8.3)

## 2019-06-22 LAB — VITAMIN D 25 HYDROXY (VIT D DEFICIENCY, FRACTURES): VITD: 31.57 ng/mL (ref 30.00–100.00)

## 2019-06-22 LAB — TSH: TSH: 3.4 u[IU]/mL (ref 0.35–4.50)

## 2019-06-24 MED ORDER — LEVOTHYROXINE SODIUM 50 MCG PO TABS: 50.0000 ug | ORAL_TABLET | Freq: Every day | ORAL | 3 refills | Status: DC

## 2019-06-24 MED ORDER — OMEGA-3-ACID ETHYL ESTERS 1 G PO CAPS: 1.0000 g | ORAL_CAPSULE | Freq: Every day | ORAL | 3 refills | Status: DC

## 2019-07-08 ENCOUNTER — Telehealth: Payer: Self-pay | Admitting: Cardiovascular Disease

## 2019-07-08 NOTE — Telephone Encounter (Signed)
Patient received emails from thn about signing up for [facility] program.  She would like to make sure this is not something essential to care and communication with Dr. Rockey Situ.    Patient would states she is managing well and likes to keep emails to minimum and really likes just using mychart .      Patient made aware this may be a program offered to help patients with education and managing their care.   Please call to discuss if there is a need for this sign up .

## 2019-07-08 NOTE — Telephone Encounter (Signed)
Called patient. She was doing investigating to see about the EMMI program through [facility] to see if this is something Dr Rockey Situ recommend she do. She was concerned about the email and it validity. Advised that this is a tool to give education to patients on certain things and that she could sign up if she likes. She was appreciative and will do that on her own time once she decides if she wants to. Informed her that MyChart is still the way we communicate with provider. She was appreciative for my time.

## 2019-07-09 ENCOUNTER — Ambulatory Visit: Payer: Medicare Other

## 2019-07-16 ENCOUNTER — Ambulatory Visit (INDEPENDENT_AMBULATORY_CARE_PROVIDER_SITE_OTHER): Payer: Medicare Other

## 2019-07-16 DIAGNOSIS — Z23 Encounter for immunization: Secondary | ICD-10-CM

## 2019-07-19 ENCOUNTER — Encounter: Payer: Self-pay | Admitting: Pulmonary Disease

## 2019-07-19 ENCOUNTER — Telehealth (INDEPENDENT_AMBULATORY_CARE_PROVIDER_SITE_OTHER): Payer: Medicare Other | Admitting: Pulmonary Disease

## 2019-07-19 DIAGNOSIS — Z7189 Other specified counseling: Secondary | ICD-10-CM

## 2019-07-19 DIAGNOSIS — J45909 Unspecified asthma, uncomplicated: Secondary | ICD-10-CM

## 2019-07-19 DIAGNOSIS — J302 Other seasonal allergic rhinitis: Secondary | ICD-10-CM | POA: Diagnosis not present

## 2019-07-19 DIAGNOSIS — J3089 Other allergic rhinitis: Secondary | ICD-10-CM

## 2019-07-19 DIAGNOSIS — Z9989 Dependence on other enabling machines and devices: Secondary | ICD-10-CM | POA: Diagnosis not present

## 2019-07-19 DIAGNOSIS — R058 Other specified cough: Secondary | ICD-10-CM

## 2019-07-19 DIAGNOSIS — R05 Cough: Secondary | ICD-10-CM | POA: Diagnosis not present

## 2019-07-19 DIAGNOSIS — G4733 Obstructive sleep apnea (adult) (pediatric): Secondary | ICD-10-CM

## 2019-07-19 MED ORDER — AZELASTINE-FLUTICASONE 137-50 MCG/ACT NA SUSP: 1.0000 | Freq: Two times a day (BID) | NASAL | 3 refills | Status: DC

## 2019-07-19 NOTE — Patient Instructions (Signed)
Follow up in 6 months 

## 2019-07-19 NOTE — Progress Notes (Signed)
[facility]  Chief Complaint  Patient presents with  . Follow-up    Sleep apnea, asthma, allergic rhinitis    Constitutional:  There were no vitals taken for this visit.  Deferred.  Past Medical History:  Vit D deficiency, Vertigo, Rosacea, Hypothyroidism, HTN, HLD, GERD, Diverticulosis, Depression, HA  Brief Summary:  Angela Mendoza is a 68 y.o. female former smoker with OSA and asthma.  Virtual Visit via Video Note  I connected with [name] on 07/19/19 at 11:30 AM EDT by a video enabled telemedicine application and verified that I am speaking with the correct person using two identifiers.  Location: Patient: home Provider: medical office   I discussed the limitations of evaluation and management by telemedicine and the availability of in person appointments. The patient expressed understanding and agreed to proceed.  She has been doing okay.  She has been staying at home to avoid exposure to [place].  Her daughter was recently diagnosed, but Ms. Lesky hasn't had any exposure to her.    She has some sinus congestion and post nasal drip with change of seasons.  Not having cough, wheeze, sputum, skin rash, or chest pain.  Uses CPAP nightly.  No issues with mask fit.  Denies dry mouth, sore throat, or aerophagia.  She got her flu shot already.   Physical Exam:   Appearance - well kempt, speaking in full sentences, no stridor   Assessment/Plan:   Allergic asthma. - continue Qvar, singulair, and prn albuterol  Upper airway cough with allergic rhinitis. - refilled dymista - continue singulair - prn allegra, nasal irrigation  Obstructive sleep apnea. - she is compliant with CPAP and reports benefit - continue CPAP 11 cm H2O  Advice given about COVID 19 infection. - discussed importance of hand hygiene, mask wearing, and social distancing - discussed symptoms to monitor for that would indicate possible infection, and  steps to take for further assessment    Patient Instructions  Follow up in 6 months   I discussed the assessment and treatment plan with the patient. The patient was provided an opportunity to ask questions and all were answered. The patient agreed with the plan and demonstrated an understanding of the instructions.   The patient was advised to call back or seek an in-person evaluation if the symptoms worsen or if the condition fails to improve as anticipated.  I provided  17 minutes of non-face-to-face time during this encounter.   Chesley Mires, MD [facility] Pager: [phone] 07/19/2019, 11:50 AM  Flow Sheet     Pulmonary tests:  Spirometry 12/12 >> FEV1% 68 RAST 11/15/14 >> IgE 30, cats/mold PFT 02/07/16 >> FEV1 2.32 (92%), FEV1% 76, FEF 25-75% 1.56 (71%), TLC 4.25 (81%), DLCO 121, borderline BD from FEF 25-75 FeNO 12/01/18 >> 7  Sleep tests:  PSG 09/06/07 >> AHI 19 CPAP 06/18/19 to 07/17/19 >> used on 30 of 30 nights with average 8 hrs 6 min.  Average AHI 0.7 with CPAP 11 cm H2O.  Medications:   Allergies as of 07/19/2019      Reactions   Codeine    Low blood pressure/nausea   Sulfonamide Derivatives    rash   Estradiol Rash   Generic estradiol patch d/t the adhesive      Medication List       Accurate as of July 19, 2019 11:50 AM. If you have any questions, ask your nurse or doctor.  AeroChamber MV inhaler Use as instructed   albuterol 108 (90 Base) MCG/ACT inhaler Commonly known as: Proventil HFA Inhale 2 puffs into the lungs every 6 (six) hours as needed.   ALPRAZolam 0.25 MG tablet Commonly known as: XANAX TAKE 1 TABLET (0.25 MG TOTAL) BY MOUTH ONCE DAILY AS NEEDED FOR SLEEP.   aspirin 81 MG tablet Take 81 mg by mouth once a week.   atorvastatin 40 MG tablet Commonly known as: LIPITOR Take 1 tablet (40 mg total) by mouth daily.   Azelastine-Fluticasone 137-50 MCG/ACT Susp Commonly known as: Dymista Place 1  spray into the nose 2 (two) times daily.   Coenzyme Q10 300 MG Caps Take 1 capsule by mouth daily.   conjugated estrogens vaginal cream Commonly known as: PREMARIN Place 1 Applicatorful vaginally 2 (two) times a week. 90 day supply,  Refill for one year   cyanocobalamin 1000 MCG/ML injection Commonly known as: (VITAMIN B-12) Inject 1 mL (1,000 mcg total) into the muscle every 30 (thirty) days.   cycloSPORINE 0.05 % ophthalmic emulsion Commonly known as: RESTASIS Place 1 drop into both eyes 2 (two) times daily.   diazepam 5 MG tablet Commonly known as: VALIUM TAKE 1 TABLET BY MOUTH EVERY 6 HOURS AS NEEDED FOR VERTIGO   EPINEPHrine 0.3 mg/0.3 mL Soaj injection Commonly known as: EPI-PEN Inject 0.3 mg into the muscle as needed for anaphylaxis.   estradiol 0.1 MG/24HR patch Commonly known as: VIVELLE-DOT Place 1 patch (0.1 mg total) onto the skin 2 (two) times a week.   famotidine 20 MG tablet Commonly known as: PEPCID Take 1 tablet (20 mg total) by mouth daily as needed for heartburn or indigestion.   fexofenadine 180 MG tablet Commonly known as: Allegra Allergy Take 1 tablet (180 mg total) by mouth daily.   levothyroxine 50 MCG tablet Commonly known as: Synthroid Take 1 tablet (50 mcg total) by mouth daily before breakfast.   losartan 50 MG tablet Commonly known as: COZAAR Take 1 tablet (50 mg total) by mouth at bedtime.   MAGNESIUM PO Take 250 mg by mouth daily.   meclizine 25 MG tablet Commonly known as: ANTIVERT Take 1 tablet (25 mg total) by mouth 3 (three) times daily as needed.   metoprolol succinate 25 MG 24 hr tablet Commonly known as: TOPROL-XL Take 2-3 tablets (50-75 mg total) by mouth daily. Take two tablets by mouth daily may take additional 25 mg tablet for palpitations or increased BP.   montelukast 10 MG tablet Commonly known as: SINGULAIR Take 1 tablet (10 mg total) by mouth at bedtime.   multivitamin tablet Take 1 tablet by mouth daily.    omega-3 acid ethyl esters 1 g capsule Commonly known as: LOVAZA Take 1 capsule (1 g total) by mouth daily.   ondansetron 4 MG disintegrating tablet Commonly known as: Zofran ODT Take 1 tablet (4 mg total) by mouth every 8 (eight) hours as needed for nausea or vomiting.   psyllium 58.6 % powder Commonly known as: METAMUCIL Take 1 packet by mouth as needed.   Qvar RediHaler 80 MCG/ACT inhaler Generic drug: beclomethasone Inhale 2 puffs into the lungs 2 (two) times daily.   STOOL SOFTENER PO Take 1 tablet by mouth as needed.   SYRINGE 3CC/20GX1" 20G X 1" 3 ML Misc 1 Syringe by Does not apply route every 30 (thirty) days. For use with b-12 injection monthly   Systane Balance 0.6 % Soln Generic drug: Propylene Glycol Apply 1 drop to eye as needed.  tretinoin 0.05 % cream Commonly known as: RETIN-A Apply topically at bedtime.   urea 10 % cream Commonly known as: CARMOL Apply topically as needed.   vitamin C with rose hips 1000 MG tablet Take 1,000 mg by mouth daily.   Vitamin D3 50 MCG (2000 UT) Tabs Take 1 tablet by mouth daily.   Vitamin-B Complex Tabs Take 1 tablet by mouth daily.       Past Surgical History:  She  has a past surgical history that includes Cholecystectomy (2007); Appendectomy (2007); Tubal ligation; Total abdominal hysterectomy (1991); Nasal sinus surgery; Breast enhancement surgery (2006); Cardiovascular stress test (2013); Colonoscopy (08/2012); Esophagogastroduodenoscopy (08/2012); US ECHOCARDIOGRAPHY (09/2009); US ECHOCARDIOGRAPHY (11/2007); sleep study (08/2007); Rectocele repair (12/2008); Colonoscopy (2006); Breast biopsy (Left, 09/27/2009); and Hernia repair.  Family History:  Her family history includes AAA (abdominal aortic aneurysm) in her paternal uncle; Aortic dissection in her maternal aunt and paternal uncle; Breast cancer in her maternal aunt; Cancer in her cousin and paternal aunt; Cancer (age of onset: 6) in her father; Cancer (age of  onset: 73) in her mother; Heart attack in her paternal uncle; Heart disease in her paternal uncle; Heart failure in her sister; Hypertension in her sister; Stroke in her paternal uncle and sister.  Social History:  She  reports that she quit smoking about 45 years ago. Her smoking use included cigarettes. She has a 2.10 pack-year smoking history. She has never used smokeless tobacco. She reports current alcohol use. She reports that she does not use drugs.

## 2019-08-01 ENCOUNTER — Ambulatory Visit: Payer: Medicare Other

## 2019-09-20 ENCOUNTER — Other Ambulatory Visit: Payer: Self-pay

## 2019-11-07 ENCOUNTER — Telehealth: Payer: Self-pay | Admitting: *Deleted

## 2019-11-07 NOTE — Telephone Encounter (Signed)
Patient contacted Access Nurse on 11/04/19 to report fall with injury to knee/ finger on right side, patient stated swelling bette using ice and that she is able to bare weight on knee just sore and finger sore to touch with some bruising. Patient also having allergy type symptoms stuffy nose, ears feel full bilateral , denies fever or chills has been stay in due to [place]. Denies fever, or chills.  Scheduled virtual for 11/08/19 at 4:30.

## 2019-11-08 ENCOUNTER — Encounter: Payer: Self-pay | Admitting: Internal Medicine

## 2019-11-08 ENCOUNTER — Other Ambulatory Visit: Payer: Self-pay

## 2019-11-08 ENCOUNTER — Ambulatory Visit (INDEPENDENT_AMBULATORY_CARE_PROVIDER_SITE_OTHER): Payer: Medicare Other | Admitting: Internal Medicine

## 2019-11-08 DIAGNOSIS — S63692A Other sprain of right middle finger, initial encounter: Secondary | ICD-10-CM

## 2019-11-08 DIAGNOSIS — S8002XA Contusion of left knee, initial encounter: Secondary | ICD-10-CM | POA: Diagnosis not present

## 2019-11-08 DIAGNOSIS — J0110 Acute frontal sinusitis, unspecified: Secondary | ICD-10-CM | POA: Diagnosis not present

## 2019-11-08 MED ORDER — PREDNISONE 10 MG PO TABS: ORAL_TABLET | ORAL | 0 refills | Status: DC

## 2019-11-08 MED ORDER — AMOXICILLIN-POT CLAVULANATE 875-125 MG PO TABS: 1.0000 | ORAL_TABLET | Freq: Two times a day (BID) | ORAL | 0 refills | Status: DC

## 2019-11-08 NOTE — Progress Notes (Signed)
Virtual Visit via [email or web address]  This visit type was conducted due to national recommendations for restrictions regarding the COVID-19 pandemic (e.g. social distancing).  This format is felt to be most appropriate for this patient at this time.  All issues noted in this document were discussed and addressed.  No physical exam was performed (except for noted visual exam findings with Video Visits).   I connected with@ on 11/08/19 at  4:30 PM EST by a video enabled telemedicine application  and verified that I am speaking with the correct person using two identifiers. Location patient: home Location provider: work or home office Persons participating in the virtual visit: patient, provider  I discussed the limitations, risks, security and privacy concerns of performing an evaluation and management service by telephone and the availability of in person appointments. I also discussed with the patient that there may be a patient responsible charge related to this service. The patient expressed understanding and agreed to proceed.  Reason for visit: 1) sinus infection 2) recent fall with minor injuries  HPI:  1) 3 week history of sinus congestion,  Left ear pain, hoarseness, post nasal drip and cough productive of purulent sputum.  No chest pain ,  fvers bodyaches.  Has not left house or had any contact with potential COVID infection in several months.   2) Fall at home on Jan 7,  Missed the bottom stop while descending a flight of stairs,  Fell directly onto left knee . signficant bruising and swelling distal to patella. .. has been icing knee,  No pain with weight bearing. Right middle finger sprain,  Has swelling of PIP. Has been wearing a removable splint since the fall.  Able to bend and extend finger fully,  No deviation.    ROS: See pertinent positives and negatives per HPI.  Past Medical History:  Diagnosis Date  . ABNORMAL HEART RHYTHMS 06/09/2008  . Allergic rhinitis 06/09/2008   chronic  .  Asthma, intrinsic 10/06/2011   controlled on qvar. [name] 09/2011:  Very mild airflow obstruction (FEV1% 68, FVL c/w obstruction)   . Chronic headache 06/09/2008  . COPD (chronic obstructive pulmonary disease) ([place])   . Depression   . Diverticulosis 2013   h/o diverticulitis  . Dry eyes   . GERD (gastroesophageal reflux disease)   . History of breast implant removal    silicone mastitis - R axilla silicone LN, free silicone L breast upper outer quadrant  . Hot flashes   . HYPERLIPIDEMIA 06/09/2008  . HYPERTENSION 06/09/2008  . Hypothyroidism   . MVP (mitral valve prolapse)   . OSA on CPAP    [name]  . Pernicious anemia    per prior pcp  . Rosacea   . Surgical menopause 1991  . Vertigo   . Vitamin D deficiency     Past Surgical History:  Procedure Laterality Date  . APPENDECTOMY  2007  . BREAST BIOPSY Left 09/27/2009  . BREAST ENHANCEMENT SURGERY  2006  . CARDIOVASCULAR STRESS TEST  2013   [place] [name] - WNL, EF 55-60%, with 0 calcium score  . CHOLECYSTECTOMY  2007   biliary dyskinesia  . COLONOSCOPY  08/2012   diverticulosis  . COLONOSCOPY  2006   inflamed hyperplastic polyp  . ESOPHAGOGASTRODUODENOSCOPY  08/2012   esophageal reflux  . HERNIA REPAIR    . NASAL SINUS SURGERY    . RECTOCELE REPAIR  12/2008   [name]  . sleep study  08/2007   OSA, 12 cm H2O,  .  TOTAL ABDOMINAL HYSTERECTOMY  1991   heavy bleeding, ovaries removed  . TUBAL LIGATION    . US ECHOCARDIOGRAPHY  09/2009   impaired relaxation, mild tric regurg, normal MV, EF 60%  . US ECHOCARDIOGRAPHY  11/2007   mild-mod MR    Family History  Problem Relation Age of Onset  . Cancer Mother 13       lung, passive smoke  . Cancer Father 85       lung, smoker  . Cancer Paternal Aunt        breast, lung  . Cancer Cousin        breast  . Stroke Sister        TIA  . Hypertension Sister   . Heart failure Sister   . Aortic dissection Paternal Uncle   . Heart attack Paternal Uncle   . AAA  (abdominal aortic aneurysm) Paternal Uncle   . Aortic dissection Maternal Aunt   . Breast cancer Maternal Aunt   . Heart disease Paternal Uncle   . Stroke Paternal Uncle     SOCIAL HX:  reports that she quit smoking about 46 years ago. Her smoking use included cigarettes. She has a 2.10 pack-year smoking history. She has never used smokeless tobacco. She reports current alcohol use. She reports that she does not use drugs.   Current Outpatient Medications:  .  albuterol (PROVENTIL HFA) 108 (90 Base) MCG/ACT inhaler, Inhale 2 puffs into the lungs every 6 (six) hours as needed., Disp: 18 g, Rfl: 2 .  ALPRAZolam (XANAX) 0.25 MG tablet, TAKE 1 TABLET (0.25 MG TOTAL) BY MOUTH ONCE DAILY AS NEEDED FOR SLEEP., Disp: 30 tablet, Rfl: 2 .  Ascorbic Acid (VITAMIN C WITH ROSE HIPS) 1000 MG tablet, Take 1,000 mg by mouth daily., Disp: , Rfl:  .  aspirin 81 MG tablet, Take 81 mg by mouth once a week. , Disp: , Rfl:  .  atorvastatin (LIPITOR) 40 MG tablet, Take 1 tablet (40 mg total) by mouth daily., Disp: 90 tablet, Rfl: 3 .  Azelastine-Fluticasone (DYMISTA) 137-50 MCG/ACT SUSP, Place 1 spray into the nose 2 (two) times daily., Disp: 23 g, Rfl: 3 .  B Complex Vitamins (VITAMIN-B COMPLEX) TABS, Take 1 tablet by mouth daily., Disp: , Rfl:  .  beclomethasone (QVAR REDIHALER) 80 MCG/ACT inhaler, Inhale 2 puffs into the lungs 2 (two) times daily., Disp: 10.6 g, Rfl: 3 .  Cholecalciferol (VITAMIN D3) 2000 units TABS, Take 1 tablet by mouth daily., Disp: 90 tablet, Rfl: 3 .  Coenzyme Q10 300 MG CAPS, Take 1 capsule by mouth daily., Disp: , Rfl:  .  conjugated estrogens (PREMARIN) vaginal cream, Place 1 Applicatorful vaginally 2 (two) times a week. 90 day supply,  Refill for one year, Disp: 90 g, Rfl: 3 .  cyanocobalamin (,VITAMIN B-12,) 1000 MCG/ML injection, Inject 1 mL (1,000 mcg total) into the muscle every 30 (thirty) days., Disp: 3 mL, Rfl: 3 .  cycloSPORINE (RESTASIS) 0.05 % ophthalmic emulsion, Place 1 drop  into both eyes 2 (two) times daily., Disp: 3 each, Rfl: 3 .  diazepam (VALIUM) 5 MG tablet, TAKE 1 TABLET BY MOUTH EVERY 6 HOURS AS NEEDED FOR VERTIGO, Disp: 30 tablet, Rfl: 2 .  Docusate Calcium (STOOL SOFTENER PO), Take 1 tablet by mouth as needed., Disp: , Rfl:  .  EPINEPHrine 0.3 mg/0.3 mL IJ SOAJ injection, Inject 0.3 mg into the muscle as needed for anaphylaxis., Disp: , Rfl:  .  estradiol (VIVELLE-DOT) 0.1 MG/24HR patch, Place  1 patch (0.1 mg total) onto the skin 2 (two) times a week., Disp: 24 patch, Rfl: 3 .  famotidine (PEPCID) 20 MG tablet, Take 1 tablet (20 mg total) by mouth daily as needed for heartburn or indigestion., Disp: 90 tablet, Rfl: 3 .  fexofenadine (ALLEGRA ALLERGY) 180 MG tablet, Take 1 tablet (180 mg total) by mouth daily., Disp: 90 tablet, Rfl: 3 .  levothyroxine (SYNTHROID) 50 MCG tablet, Take 1 tablet (50 mcg total) by mouth daily before breakfast., Disp: 90 tablet, Rfl: 3 .  losartan (COZAAR) 50 MG tablet, Take 1 tablet (50 mg total) by mouth at bedtime., Disp: 90 tablet, Rfl: 1 .  MAGNESIUM PO, Take 250 mg by mouth daily., Disp: , Rfl:  .  meclizine (ANTIVERT) 25 MG tablet, Take 1 tablet (25 mg total) by mouth 3 (three) times daily as needed., Disp: 270 tablet, Rfl: 3 .  metoprolol succinate (TOPROL-XL) 25 MG 24 hr tablet, Take 2-3 tablets (50-75 mg total) by mouth daily. Take two tablets by mouth daily may take additional 25 mg tablet for palpitations or increased BP., Disp: 270 tablet, Rfl: 3 .  montelukast (SINGULAIR) 10 MG tablet, Take 1 tablet (10 mg total) by mouth at bedtime., Disp: 90 tablet, Rfl: 3 .  Multiple Vitamin (MULTIVITAMIN) tablet, Take 1 tablet by mouth daily., Disp: , Rfl:  .  omega-3 acid ethyl esters (LOVAZA) 1 g capsule, Take 1 capsule (1 g total) by mouth daily., Disp: 90 capsule, Rfl: 3 .  ondansetron (ZOFRAN ODT) 4 MG disintegrating tablet, Take 1 tablet (4 mg total) by mouth every 8 (eight) hours as needed for nausea or vomiting., Disp: 30  tablet, Rfl: 2 .  Propylene Glycol (SYSTANE BALANCE) 0.6 % SOLN, Apply 1 drop to eye as needed., Disp: , Rfl:  .  psyllium (METAMUCIL) 58.6 % powder, Take 1 packet by mouth as needed., Disp: , Rfl:  .  Spacer/Aero-Holding Chambers (AEROCHAMBER MV) inhaler, Use as instructed, Disp: 1 each, Rfl: 3 .  Syringe/Needle, Disp, (SYRINGE 3CC/20GX1") 20G X 1" 3 ML MISC, 1 Syringe by Does not apply route every 30 (thirty) days. For use with b-12 injection monthly, Disp: 50 each, Rfl: 0 .  tretinoin (RETIN-A) 0.05 % cream, Apply topically at bedtime., Disp: 45 g, Rfl: 1 .  urea (CARMOL) 10 % cream, Apply topically as needed., Disp: 90 g, Rfl: 3 .  amoxicillin-clavulanate (AUGMENTIN) 875-125 MG tablet, Take 1 tablet by mouth 2 (two) times daily., Disp: 14 tablet, Rfl: 0 .  predniSONE (DELTASONE) 10 MG tablet, 6 tablets on Day 1 , then reduce by 1 tablet daily until gone, Disp: 21 tablet, Rfl: 0  EXAM:  VITALS per patient if applicable:  GENERAL: alert, oriented, appears well and in no acute distress  HEENT: atraumatic, conjunttiva clear, no obvious abnormalities on inspection of external nose and ears  NECK: normal movements of the head and neck  LUNGS: on inspection no signs of respiratory distress, breathing rate appears normal, no obvious gross SOB, gasping or wheezing  CV: no obvious cyanosis  MS: right middle finger with mild swelling of PIP,  No lateral or medial deviation,  No bruising.  Left knee with significant ecchymosis ,  Diffuse swelling,  ROM full   PSYCH/NEURO: pleasant and cooperative, no obvious depression or anxiety, speech and thought processing grossly intact  ASSESSMENT AND PLAN:  Discussed the following assessment and plan:  Acute non-recurrent frontal sinusitis  Other sprain of right middle finger, initial encounter  Contusion of left  knee, initial encounter  Sinusitis, acute frontal Given chronicity of symptoms, development of facial pain and exam consistent with  bacterial URI,  Will treat with empiric antibiotics, prednisone taperdecongestants, and saline lavage.    Other sprain of right middle finger, initial encounter No evidence of fracture.  continue use of splint for 6 weeks.   Contusion of knee, left Secondary to fall.  Weight bearing without pain. Continue icing and elevation.     I discussed the assessment and treatment plan with the patient. The patient was provided an opportunity to ask questions and all were answered. The patient agreed with the plan and demonstrated an understanding of the instructions.   The patient was advised to call back or seek an in-person evaluation if the symptoms worsen or if the condition fails to improve as anticipated.  Angela Mc, MD

## 2019-11-09 DIAGNOSIS — S63692A Other sprain of right middle finger, initial encounter: Secondary | ICD-10-CM | POA: Insufficient documentation

## 2019-11-09 DIAGNOSIS — S8002XA Contusion of left knee, initial encounter: Secondary | ICD-10-CM | POA: Insufficient documentation

## 2019-11-09 DIAGNOSIS — J0110 Acute frontal sinusitis, unspecified: Secondary | ICD-10-CM | POA: Insufficient documentation

## 2019-11-09 NOTE — Assessment & Plan Note (Addendum)
Given chronicity of symptoms, development of facial pain and exam consistent with bacterial URI,  Will treat with empiric antibiotics, prednisone taperdecongestants, and saline lavage.

## 2019-11-09 NOTE — Assessment & Plan Note (Signed)
No evidence of fracture.  continue use of splint for 6 weeks.

## 2019-11-09 NOTE — Assessment & Plan Note (Signed)
Secondary to fall.  Weight bearing without pain. Continue icing and elevation.

## 2019-11-11 ENCOUNTER — Telehealth: Payer: Self-pay | Admitting: Internal Medicine

## 2019-11-11 NOTE — Telephone Encounter (Signed)
My Chart message sent

## 2019-11-11 NOTE — Telephone Encounter (Signed)
Pt would like to get the covid vaccine. She is currently on medication for her knee/sinus infection. When can she go and get the covid vaccine.

## 2019-11-14 NOTE — Telephone Encounter (Signed)
Pt had not see the mychart message so I called her to let her know. Pt gave a verbal understanding and stated that she was probably going to wait a while to get the vaccine anyways.

## 2019-12-06 ENCOUNTER — Other Ambulatory Visit: Payer: Self-pay

## 2019-12-06 ENCOUNTER — Encounter: Payer: Self-pay | Admitting: Internal Medicine

## 2019-12-06 ENCOUNTER — Ambulatory Visit (INDEPENDENT_AMBULATORY_CARE_PROVIDER_SITE_OTHER): Payer: Medicare Other | Admitting: Internal Medicine

## 2019-12-06 VITALS — Ht 65.0 in | Wt 177.0 lb

## 2019-12-06 DIAGNOSIS — I10 Essential (primary) hypertension: Secondary | ICD-10-CM

## 2019-12-06 DIAGNOSIS — J0110 Acute frontal sinusitis, unspecified: Secondary | ICD-10-CM | POA: Diagnosis not present

## 2019-12-06 DIAGNOSIS — S8002XA Contusion of left knee, initial encounter: Secondary | ICD-10-CM | POA: Diagnosis not present

## 2019-12-06 DIAGNOSIS — S63692A Other sprain of right middle finger, initial encounter: Secondary | ICD-10-CM | POA: Diagnosis not present

## 2019-12-06 DIAGNOSIS — E034 Atrophy of thyroid (acquired): Secondary | ICD-10-CM | POA: Diagnosis not present

## 2019-12-06 MED ORDER — METOPROLOL SUCCINATE ER 50 MG PO TB24: 50.0000 mg | ORAL_TABLET | Freq: Every day | ORAL | 2 refills | Status: DC

## 2019-12-06 MED ORDER — QVAR REDIHALER 80 MCG/ACT IN AERB: 2.0000 | INHALATION_SPRAY | Freq: Two times a day (BID) | RESPIRATORY_TRACT | 3 refills | Status: DC

## 2019-12-06 MED ORDER — LOSARTAN POTASSIUM 50 MG PO TABS: 50.0000 mg | ORAL_TABLET | Freq: Every day | ORAL | 1 refills | Status: DC

## 2019-12-06 MED ORDER — AZELASTINE-FLUTICASONE 137-50 MCG/ACT NA SUSP: 1.0000 | Freq: Two times a day (BID) | NASAL | 3 refills | Status: DC

## 2019-12-06 MED ORDER — ESTRADIOL 0.1 MG/24HR TD PTTW: 1.0000 | MEDICATED_PATCH | TRANSDERMAL | 3 refills | Status: DC

## 2019-12-06 NOTE — Assessment & Plan Note (Signed)
Well controlled on current regimen. Renal function  is due; no changes today.  

## 2019-12-06 NOTE — Assessment & Plan Note (Signed)
Symptoms have resolved.

## 2019-12-06 NOTE — Progress Notes (Signed)
Virtual Visit via [email or web address]  This visit type was conducted due to national recommendations for restrictions regarding the COVID-19 pandemic (e.g. social distancing).  This format is felt to be most appropriate for this patient at this time.  All issues noted in this document were discussed and addressed.  No physical exam was performed (except for noted visual exam findings with Video Visits).   I connected with@ on 12/06/19 at  3:30 PM EST by a video enabled telemedicine application and verified that I am speaking with the correct person using two identifiers. Location patient: home Location provider: work or home office Persons participating in the virtual visit: patient, provider  I discussed the limitations, risks, security and privacy concerns of performing an evaluation and management service by telephone and the availability of in person appointments. I also discussed with the patient that there may be a patient responsible charge related to this service. The patient expressed understanding and agreed to proceed.   Reason for visit: follow up  HPI:  1) right middle finger injury with PIP swelling:  She has been wearing a brace since her last Visit and notes that lately the finger hurts more after wearing the brace.  She has been exercising the finger and has been able to fully extend and flex finger.    2) left knee injury :  After evaluation the  knee became more swollen for 2 weeks,  And she developed bruising both anteriorly and posteriorly, whi now improving   3) sinusitis : resolved .  Still has allergies  Needs dymista refilled   4) COVID VACCINE NEEDED, labs needed   HTN, HYPERLIPIDEMIA,  HYPOTHYROID:  Hypertension: patient checks blood pressure twice weekly at home.  Readings have been for the most part < 140/80 at rest . Patient is following a reduce salt diet most days and is taking medications as prescribed   ROS: See pertinent positives and negatives per HPI.  Past  Medical History:  Diagnosis Date  . ABNORMAL HEART RHYTHMS 06/09/2008  . Allergic rhinitis 06/09/2008   chronic  . Asthma, intrinsic 10/06/2011   controlled on qvar. [name] 09/2011:  Very mild airflow obstruction (FEV1% 68, FVL c/w obstruction)   . Chronic headache 06/09/2008  . COPD (chronic obstructive pulmonary disease) ([place])   . Depression   . Diverticulosis 2013   h/o diverticulitis  . Dry eyes   . GERD (gastroesophageal reflux disease)   . History of breast implant removal    silicone mastitis - R axilla silicone LN, free silicone L breast upper outer quadrant  . Hot flashes   . HYPERLIPIDEMIA 06/09/2008  . HYPERTENSION 06/09/2008  . Hypothyroidism   . MVP (mitral valve prolapse)   . OSA on CPAP    [name]  . Pernicious anemia    per prior pcp  . Rosacea   . Surgical menopause 1991  . Vertigo   . Vitamin D deficiency     Past Surgical History:  Procedure Laterality Date  . APPENDECTOMY  2007  . BREAST BIOPSY Left 09/27/2009  . BREAST ENHANCEMENT SURGERY  2006  . CARDIOVASCULAR STRESS TEST  2013   [place] [name] - WNL, EF 55-60%, with 0 calcium score  . CHOLECYSTECTOMY  2007   biliary dyskinesia  . COLONOSCOPY  08/2012   diverticulosis  . COLONOSCOPY  2006   inflamed hyperplastic polyp  . ESOPHAGOGASTRODUODENOSCOPY  08/2012   esophageal reflux  . HERNIA REPAIR    . NASAL SINUS SURGERY    . RECTOCELE  REPAIR  12/2008   [name]  . sleep study  08/2007   OSA, 12 cm H2O,  . TOTAL ABDOMINAL HYSTERECTOMY  1991   heavy bleeding, ovaries removed  . TUBAL LIGATION    . US ECHOCARDIOGRAPHY  09/2009   impaired relaxation, mild tric regurg, normal MV, EF 60%  . US ECHOCARDIOGRAPHY  11/2007   mild-mod MR    Family History  Problem Relation Age of Onset  . Cancer Mother 61       lung, passive smoke  . Cancer Father 30       lung, smoker  . Cancer Paternal Aunt        breast, lung  . Cancer Cousin        breast  . Stroke Sister        TIA  . Hypertension  Sister   . Heart failure Sister   . Aortic dissection Paternal Uncle   . Heart attack Paternal Uncle   . AAA (abdominal aortic aneurysm) Paternal Uncle   . Aortic dissection Maternal Aunt   . Breast cancer Maternal Aunt   . Heart disease Paternal Uncle   . Stroke Paternal Uncle     SOCIAL HX:  reports that she quit smoking about 46 years ago. Her smoking use included cigarettes. She has a 2.10 pack-year smoking history. She has never used smokeless tobacco. She reports previous alcohol use. She reports that she does not use drugs.   Current Outpatient Medications:  .  albuterol (PROVENTIL HFA) 108 (90 Base) MCG/ACT inhaler, Inhale 2 puffs into the lungs every 6 (six) hours as needed., Disp: 18 g, Rfl: 2 .  ALPRAZolam (XANAX) 0.25 MG tablet, TAKE 1 TABLET (0.25 MG TOTAL) BY MOUTH ONCE DAILY AS NEEDED FOR SLEEP., Disp: 30 tablet, Rfl: 2 .  Ascorbic Acid (VITAMIN C WITH ROSE HIPS) 1000 MG tablet, Take 1,000 mg by mouth daily., Disp: , Rfl:  .  aspirin 81 MG tablet, Take 81 mg by mouth once a week. , Disp: , Rfl:  .  atorvastatin (LIPITOR) 40 MG tablet, Take 1 tablet (40 mg total) by mouth daily., Disp: 90 tablet, Rfl: 3 .  Azelastine-Fluticasone (DYMISTA) 137-50 MCG/ACT SUSP, Place 1 spray into the nose 2 (two) times daily., Disp: 23 g, Rfl: 3 .  B Complex Vitamins (VITAMIN-B COMPLEX) TABS, Take 1 tablet by mouth daily., Disp: , Rfl:  .  beclomethasone (QVAR REDIHALER) 80 MCG/ACT inhaler, Inhale 2 puffs into the lungs 2 (two) times daily., Disp: 10.6 g, Rfl: 3 .  Cholecalciferol (VITAMIN D3) 2000 units TABS, Take 1 tablet by mouth daily., Disp: 90 tablet, Rfl: 3 .  Coenzyme Q10 300 MG CAPS, Take 1 capsule by mouth daily., Disp: , Rfl:  .  conjugated estrogens (PREMARIN) vaginal cream, Place 1 Applicatorful vaginally 2 (two) times a week. 90 day supply,  Refill for one year, Disp: 90 g, Rfl: 3 .  cyanocobalamin (,VITAMIN B-12,) 1000 MCG/ML injection, Inject 1 mL (1,000 mcg total) into the  muscle every 30 (thirty) days., Disp: 3 mL, Rfl: 3 .  cycloSPORINE (RESTASIS) 0.05 % ophthalmic emulsion, Place 1 drop into both eyes 2 (two) times daily., Disp: 3 each, Rfl: 3 .  diazepam (VALIUM) 5 MG tablet, TAKE 1 TABLET BY MOUTH EVERY 6 HOURS AS NEEDED FOR VERTIGO, Disp: 30 tablet, Rfl: 2 .  Docusate Calcium (STOOL SOFTENER PO), Take 1 tablet by mouth as needed., Disp: , Rfl:  .  EPINEPHrine 0.3 mg/0.3 mL IJ SOAJ injection, Inject  0.3 mg into the muscle as needed for anaphylaxis., Disp: , Rfl:  .  [START ON 12/08/2019] estradiol (VIVELLE-DOT) 0.1 MG/24HR patch, Place 1 patch (0.1 mg total) onto the skin 2 (two) times a week., Disp: 24 patch, Rfl: 3 .  famotidine (PEPCID) 20 MG tablet, Take 1 tablet (20 mg total) by mouth daily as needed for heartburn or indigestion., Disp: 90 tablet, Rfl: 3 .  fexofenadine (ALLEGRA ALLERGY) 180 MG tablet, Take 1 tablet (180 mg total) by mouth daily., Disp: 90 tablet, Rfl: 3 .  levothyroxine (SYNTHROID) 50 MCG tablet, Take 1 tablet (50 mcg total) by mouth daily before breakfast., Disp: 90 tablet, Rfl: 3 .  losartan (COZAAR) 50 MG tablet, Take 1 tablet (50 mg total) by mouth at bedtime., Disp: 90 tablet, Rfl: 1 .  MAGNESIUM PO, Take 250 mg by mouth daily., Disp: , Rfl:  .  meclizine (ANTIVERT) 25 MG tablet, Take 1 tablet (25 mg total) by mouth 3 (three) times daily as needed., Disp: 270 tablet, Rfl: 3 .  metoprolol succinate (TOPROL-XL) 50 MG 24 hr tablet, Take 1 tablet (50 mg total) by mouth daily., Disp: 90 tablet, Rfl: 2 .  montelukast (SINGULAIR) 10 MG tablet, Take 1 tablet (10 mg total) by mouth at bedtime., Disp: 90 tablet, Rfl: 3 .  Multiple Vitamin (MULTIVITAMIN) tablet, Take 1 tablet by mouth daily., Disp: , Rfl:  .  omega-3 acid ethyl esters (LOVAZA) 1 g capsule, Take 1 capsule (1 g total) by mouth daily., Disp: 90 capsule, Rfl: 3 .  ondansetron (ZOFRAN ODT) 4 MG disintegrating tablet, Take 1 tablet (4 mg total) by mouth every 8 (eight) hours as needed  for nausea or vomiting., Disp: 30 tablet, Rfl: 2 .  Propylene Glycol (SYSTANE BALANCE) 0.6 % SOLN, Apply 1 drop to eye as needed., Disp: , Rfl:  .  psyllium (METAMUCIL) 58.6 % powder, Take 1 packet by mouth as needed., Disp: , Rfl:  .  Spacer/Aero-Holding Chambers (AEROCHAMBER MV) inhaler, Use as instructed, Disp: 1 each, Rfl: 3 .  Syringe/Needle, Disp, (SYRINGE 3CC/20GX1") 20G X 1" 3 ML MISC, 1 Syringe by Does not apply route every 30 (thirty) days. For use with b-12 injection monthly, Disp: 50 each, Rfl: 0 .  tretinoin (RETIN-A) 0.05 % cream, Apply topically at bedtime., Disp: 45 g, Rfl: 1 .  urea (CARMOL) 10 % cream, Apply topically as needed., Disp: 90 g, Rfl: 3  EXAM:  VITALS per patient if applicable:  GENERAL: alert, oriented, appears well and in no acute distress  HEENT: atraumatic, conjunttiva clear, no obvious abnormalities on inspection of external nose and ears  NECK: normal movements of the head and neck  LUNGS: on inspection no signs of respiratory distress, breathing rate appears normal, no obvious gross SOB, gasping or wheezing  CV: no obvious cyanosis  MS: moves all visible extremities without noticeable abnormality  PSYCH/NEURO: pleasant and cooperative, no obvious depression or anxiety, speech and thought processing grossly intact  ASSESSMENT AND PLAN:  Discussed the following assessment and plan:  Hypothyroidism due to acquired atrophy of thyroid - Plan: TSH  Essential hypertension, benign - Plan: metoprolol succinate (TOPROL-XL) 50 MG 24 hr tablet, Lipid panel, Comprehensive metabolic panel  Other sprain of right middle finger, initial encounter  Contusion of left knee, initial encounter  Acute non-recurrent frontal sinusitis  Essential hypertension, benign Well controlled on current regimen. Renal function is due  no changes today.  Other sprain of right middle finger, initial encounter Mobility restored. Sprain resolved.  Contusion of knee,  left She has no pain with weight bearing .  Bruising and swelling resolving   Sinusitis, acute frontal Symptoms have resolved.     I discussed the assessment and treatment plan with the patient. The patient was provided an opportunity to ask questions and all were answered. The patient agreed with the plan and demonstrated an understanding of the instructions.   The patient was advised to call back or seek an in-person evaluation if the symptoms worsen or if the condition fails to improve as anticipated.   I provided 30 minutes of non-face-to-face time during this encounter reviewing patient's current problems and post surgeries.  Providing counseling on the above mentioned problems , and coordination  of care .   Crecencio Mc, MD

## 2019-12-06 NOTE — Assessment & Plan Note (Signed)
She has no pain with weight bearing .  Bruising and swelling resolving

## 2019-12-06 NOTE — Assessment & Plan Note (Signed)
Mobility restored. Sprain resolved.

## 2019-12-07 ENCOUNTER — Other Ambulatory Visit: Payer: Self-pay

## 2019-12-07 ENCOUNTER — Other Ambulatory Visit: Payer: Medicare Other

## 2019-12-07 DIAGNOSIS — I10 Essential (primary) hypertension: Secondary | ICD-10-CM

## 2019-12-07 MED ORDER — METOPROLOL SUCCINATE ER 50 MG PO TB24: 50.0000 mg | ORAL_TABLET | Freq: Every day | ORAL | 2 refills | Status: DC

## 2019-12-08 ENCOUNTER — Other Ambulatory Visit: Payer: Self-pay

## 2019-12-08 ENCOUNTER — Other Ambulatory Visit (INDEPENDENT_AMBULATORY_CARE_PROVIDER_SITE_OTHER): Payer: Medicare Other

## 2019-12-08 DIAGNOSIS — I10 Essential (primary) hypertension: Secondary | ICD-10-CM | POA: Diagnosis not present

## 2019-12-08 DIAGNOSIS — E034 Atrophy of thyroid (acquired): Secondary | ICD-10-CM | POA: Diagnosis not present

## 2019-12-08 LAB — COMPREHENSIVE METABOLIC PANEL
ALT: 23 U/L (ref 0–35)
AST: 20 U/L (ref 0–37)
Albumin: 4.4 g/dL (ref 3.5–5.2)
Alkaline Phosphatase: 66 U/L (ref 39–117)
BUN: 9 mg/dL (ref 6–23)
CO2: 29 mEq/L (ref 19–32)
Calcium: 9.5 mg/dL (ref 8.4–10.5)
Chloride: 102 mEq/L (ref 96–112)
Creatinine, Ser: 0.81 mg/dL (ref 0.40–1.20)
GFR: 70.09 mL/min (ref >60.00)
Glucose, Bld: 89 mg/dL (ref 70–99)
Potassium: 3.9 mEq/L (ref 3.5–5.1)
Sodium: 138 mEq/L (ref 135–145)
Total Bilirubin: 1.1 mg/dL (ref 0.2–1.2)
Total Protein: 7.5 g/dL (ref 6.0–8.3)

## 2019-12-08 LAB — LIPID PANEL
Cholesterol: 170 mg/dL (ref 0–200)
HDL: 65.7 mg/dL (ref >39.00)
LDL Cholesterol: 81 mg/dL (ref 0–99)
NonHDL: 104.45
Total CHOL/HDL Ratio: 3
Triglycerides: 115 mg/dL (ref 0.0–149.0)
VLDL: 23 mg/dL (ref 0.0–40.0)

## 2019-12-08 LAB — TSH: TSH: 3.21 u[IU]/mL (ref 0.35–4.50)

## 2019-12-11 DIAGNOSIS — Z23 Encounter for immunization: Secondary | ICD-10-CM | POA: Diagnosis not present

## 2019-12-13 ENCOUNTER — Other Ambulatory Visit: Payer: Self-pay

## 2019-12-13 MED ORDER — MONTELUKAST SODIUM 10 MG PO TABS: 10.0000 mg | ORAL_TABLET | Freq: Every day | ORAL | 3 refills | Status: DC

## 2019-12-13 MED ORDER — LEVOTHYROXINE SODIUM 50 MCG PO TABS: 50.0000 ug | ORAL_TABLET | Freq: Every day | ORAL | 3 refills | Status: DC

## 2019-12-13 MED ORDER — CYCLOSPORINE 0.05 % OP EMUL: 1.0000 [drp] | Freq: Two times a day (BID) | OPHTHALMIC | 3 refills | Status: DC

## 2019-12-13 MED ORDER — ESTROGENS, CONJUGATED 0.625 MG/GM VA CREA: 1.0000 | TOPICAL_CREAM | VAGINAL | 3 refills | Status: DC

## 2019-12-13 MED ORDER — OMEGA-3-ACID ETHYL ESTERS 1 G PO CAPS: 1.0000 g | ORAL_CAPSULE | Freq: Every day | ORAL | 3 refills | Status: DC

## 2019-12-16 MED ORDER — LOSARTAN POTASSIUM 50 MG PO TABS: 50.0000 mg | ORAL_TABLET | Freq: Every day | ORAL | 3 refills | Status: DC

## 2020-01-08 DIAGNOSIS — Z23 Encounter for immunization: Secondary | ICD-10-CM | POA: Diagnosis not present

## 2020-01-23 ENCOUNTER — Telehealth: Payer: Self-pay | Admitting: Pulmonary Disease

## 2020-01-23 MED ORDER — PREDNISONE 10 MG PO TABS: ORAL_TABLET | ORAL | 0 refills | Status: DC

## 2020-01-23 NOTE — Telephone Encounter (Signed)
01/23/20   Can offer the patient:  Prednisone 10mg  tablet  >>>4 tabs for 2 days, then 3 tabs for 2 days, 2 tabs for 2 days, then 1 tab for 2 days, then stop >>>take with food  >>>take in the morning   Okay to place the order  Do not believe that we need to repeat antibiotics if she is recently been treated with Augmentin and is not having fevers.  Patient needs to keep scheduled visit on 01/30/2020.  I would favor that the patient actually be seen in office for that evaluation given the fact that retreating her with prednisone today we may need to consider other recommendations.   Continue Allegra, Mucinex, Singulair, Dymista.  Patient should also be doing nasal saline rinses twice daily prior to the Dymista to see if that can help with thinning out nasal mucus.  Wyn Quaker, FNP

## 2020-01-23 NOTE — Telephone Encounter (Signed)
Called and spoke to pt. Informed her of the recs per BPM. Rx sent to preferred pharmacy. Pt verbalized understanding and denied any further questions or concerns at this time.

## 2020-01-23 NOTE — Telephone Encounter (Signed)
Spoke with the pt. She is c/o increased cough x 2 months.  She states it's prod but she swallows sputum so unsure of color the majority of the time, but did notice some white a few days ago.  She notices cough is worse when lies down and had to sleep in a reclined position last night.  She is also c/o increased SOB but has not really noticed any wheezing or chest tightness. She gets winded walking up stairs.  She is using her albuterol inhaler about 2 x per day and still on qvar bid  She states that she is she is also having a lot of PND and her nose will run and then get stuffy.  She had a sinus infection in Feb 2021 and Dr Derrel Nip treated with Augmentin.  She takes allegra, mucinex, singulair and dymista.  She denies fever, chills, aches and has had her covid vaccine.  Her sats while on phone are 98% RA, pulse 83 and temp 97.9.  Televisit set for 4/5 which is unfortunately the first available appt that we had to offer.   Please advise thanks!

## 2020-01-29 NOTE — Progress Notes (Signed)
@Patient  ID: Angela Mendoza, female    DOB: December 25, 1950, 69 y.o.   MRN: [id number]  Chief Complaint  Patient presents with  . Follow-up    acute asthma cough.  sob mostly while walking and some at rest.  cough clear or white.  PND and congested in ears.      Referring provider: Crecencio Mc, MD  HPI:  69 year old female former smoker followed in our office for asthma and obstructive sleep apnea  PMH: Hyperlipidemia, hypertension, GERD, anxiety, hypothyroidism, constipation, B12 deficiency, A. fib Smoker/ Smoking History: Former smoker.  Quit 1975 Maintenance: Qvar Pt of: Dr. Halford Chessman  01/30/2020  - Visit   69 year old female former smoker on office for asthma and obstructive sleep sleep apnea.  Patient presenting to office today as an acute visit.  Angela Mendoza was treated telephonically on 01/23/2020 with a prednisone taper.  Patient had recently already been treated with Augmentin.  Angela Mendoza continues to utilize [employer], [employer], Singulair and [employer] [facility] [employer].  Patient reports Angela Mendoza was treated with Augmentin in February/2021 when Angela Mendoza felt that Angela Mendoza had a sinus infection.  Angela Mendoza primary care treated Angela Mendoza with Augmentin for this.  Angela Mendoza is presenting to our office today Angela Mendoza is reporting some of Angela Mendoza symptoms have improved since being treated with prednisone telephonically.  Angela Mendoza continues to utilize Qvar.  Angela Mendoza continues to have rhinorrhea, congestion, postnasal drip.  Angela Mendoza typically struggles with allergies.  Angela Mendoza is known allergies to tree pollen, grasses as well as dust.  Angela Mendoza also has been cleaning a lot of Angela Mendoza house out Angela Mendoza has not been wearing a mask doing this.  Patient was walked in our office did not have any oxygen desaturations.  Questionaires / Pulmonary Flowsheets:   ACT:  Asthma Control Test ACT Total Score  01/30/2020 13    Tests:   Pulmonary tests:  Spirometry 12/12 >> FEV1% 68 RAST 11/15/14 >> IgE 30, cats/mold PFT 02/07/16 >> FEV1 2.32 (92%), FEV1% 76, FEF 25-75% 1.56 (71%), TLC 4.25 (81%), DLCO 121,  borderline BD from FEF 25-75 FeNO 12/01/18 >> 7  Sleep tests:  PSG 09/06/07 >> AHI 19 CPAP 06/18/19 to 07/17/19 >> used on 30 of 30 nights with average 8 hrs 6 min.  Average AHI 0.7 with CPAP 11 cm H2O.  FENO:  Lab Results  Component Value Date   NITRICOXIDE 7 12/01/2018    PFT: PFT Results Latest Ref Rng & Units 02/06/2016  FVC-Pre L 2.98  FVC-Predicted Pre % 90  FVC-Post L 3.04  FVC-Predicted Post % 92  Pre FEV1/FVC % % 74  Post FEV1/FCV % % 76  FEV1-Pre L 2.19  FEV1-Predicted Pre % 87  FEV1-Post L 2.32  DLCO UNC% % 121  DLCO COR %Predicted % 87    WALK:  No flowsheet data found.  Imaging: No results found.  Lab Results:  CBC    Component Value Date/Time   WBC 9.0 04/28/2018 1017   RBC 4.28 04/28/2018 1017   HGB 13.5 04/28/2018 1017   HGB 13.7 12/07/2012 0000   HCT 39.5 04/28/2018 1017   PLT 269 04/28/2018 1017   MCV 92.3 04/28/2018 1017   MCH 31.5 04/28/2018 1017   MCHC 34.2 04/28/2018 1017   RDW 11.9 04/28/2018 1017   LYMPHSABS 2,079 04/28/2018 1017   MONOABS 0.8 05/26/2017 1703   EOSABS 36 04/28/2018 1017   BASOSABS 27 04/28/2018 1017    BMET    Component Value Date/Time   NA 138 12/08/2019 0920   NA 142 09/26/2016 0000  K 3.9 12/08/2019 0920   CL 102 12/08/2019 0920   CO2 29 12/08/2019 0920   GLUCOSE 89 12/08/2019 0920   BUN 9 12/08/2019 0920   BUN 12 09/26/2016 0000   CREATININE 0.81 12/08/2019 0920   CREATININE 0.96 04/12/2013 0000   CALCIUM 9.5 12/08/2019 0920    BNP No results found for: BNP  ProBNP No results found for: PROBNP  Specialty Problems      Pulmonary Problems   OSA on CPAP    CPAP 01/06/16 to 02/04/16 >> used on 30 of 30 nights with average 7 hrs 53 min.  Average AHI 0.6 with CPAP 13 cm H2O      Seasonal and perennial allergic rhinitis    Labs:11/15/14-  Allergy profile Total IgE 30, elevated for cat, grass, molds; sed rate 37, negative for foods and ANA.      Asthma, intrinsic    [name] 09/2011:  Very mild  airflow obstruction (FEV1% 68, FVL c/w obstruction)       Upper airway cough syndrome   Uncomplicated asthma   Sinusitis, acute frontal      Allergies  Allergen Reactions  . Codeine     Low blood pressure/nausea  . Sulfonamide Derivatives     rash  . Estradiol Rash    Generic estradiol patch d/t the adhesive    Immunization History  Administered Date(s) Administered  . Fluad Quad(high Dose 65+) 07/16/2019  . Influenza Split 07/15/2013, 08/04/2017  . Influenza Whole 07/27/2009, 07/28/2011, 07/21/2012  . Influenza, High Dose Seasonal PF 07/22/2017, 07/01/2018  . Influenza,inj,Quad PF,6+ Mos 08/03/2014, 07/12/2015, 07/14/2016  . Influenza-Unspecified 07/15/2013, 08/03/2014, 07/12/2015, 07/14/2016, 08/04/2017  . Moderna SARS-COVID-2 Vaccination 12/11/2019, 01/08/2020  . Pneumococcal Conjugate-13 11/08/2014  . Pneumococcal Polysaccharide-23 07/27/2009, 12/27/2015  . Td 10/27/2008  . Tdap 12/19/2013, 02/25/2017  . Tetanus 10/27/2008  . Zoster 10/27/2009    Past Medical History:  Diagnosis Date  . ABNORMAL HEART RHYTHMS 06/09/2008  . Allergic rhinitis 06/09/2008   chronic  . Asthma, intrinsic 10/06/2011   controlled on qvar. [name] 09/2011:  Very mild airflow obstruction (FEV1% 68, FVL c/w obstruction)   . Chronic headache 06/09/2008  . COPD (chronic obstructive pulmonary disease) ([place])   . Depression   . Diverticulosis 2013   h/o diverticulitis  . Dry eyes   . GERD (gastroesophageal reflux disease)   . History of breast implant removal    silicone mastitis - R axilla silicone LN, free silicone L breast upper outer quadrant  . Hot flashes   . HYPERLIPIDEMIA 06/09/2008  . HYPERTENSION 06/09/2008  . Hypothyroidism   . MVP (mitral valve prolapse)   . OSA on CPAP    [name]  . Pernicious anemia    per prior pcp  . Rosacea   . Surgical menopause 1991  . Vertigo   . Vitamin D deficiency     Tobacco History: Social History   Tobacco Use  Smoking Status Former Smoker   . Packs/day: 0.30  . Years: 7.00  . Pack years: 2.10  . Types: Cigarettes  . Quit date: 10/27/1973  . 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 Hx of children who are grown   Continue to not smoke  Outpatient Encounter Medications as of 01/30/2020  Medication Sig  . albuterol (PROVENTIL HFA) 108 (90  Base) MCG/ACT inhaler Inhale 2 puffs into the lungs every 6 (six) hours as needed.  . ALPRAZolam (XANAX) 0.25 MG tablet TAKE 1 TABLET (0.25 MG TOTAL) BY MOUTH ONCE DAILY AS NEEDED FOR SLEEP.  . Ascorbic Acid (VITAMIN C WITH ROSE HIPS) 1000 MG tablet Take 1,000 mg by mouth daily.  [name] aspirin 81 MG tablet Take 81 mg by mouth once a week.   [name] atorvastatin (LIPITOR) 40 MG tablet Take 1 tablet (40 mg total) by mouth daily.  . Azelastine-Fluticasone (DYMISTA) 137-50 MCG/ACT SUSP Place 1 spray into the nose 2 (two) times daily.  . B Complex Vitamins (VITAMIN-B COMPLEX) TABS Take 1 tablet by mouth daily.  . beclomethasone (QVAR REDIHALER) 80 MCG/ACT inhaler Inhale 2 puffs into the lungs 2 (two) times daily.  . Cholecalciferol (VITAMIN D3) 2000 units TABS Take 1 tablet by mouth daily.  . Coenzyme Q10 300 MG CAPS Take 1 capsule by mouth daily.  [name] conjugated estrogens (PREMARIN) vaginal cream Place 1 Applicatorful vaginally 2 (two) times a week. 90 day supply,  Refill for one year  . cyanocobalamin (,VITAMIN B-12,) 1000 MCG/ML injection Inject 1 mL (1,000 mcg total) into the muscle every 30 (thirty) days.  . cycloSPORINE (RESTASIS) 0.05 % ophthalmic emulsion Place 1 drop into both eyes 2 (two) times daily.  . diazepam (VALIUM) 5 MG tablet TAKE 1 TABLET BY MOUTH EVERY 6 HOURS AS NEEDED FOR VERTIGO  . Docusate Calcium (STOOL SOFTENER PO) Take 1 tablet by mouth as needed.  [name] EPINEPHrine 0.3  mg/0.3 mL IJ SOAJ injection Inject 0.3 mg into the muscle as needed for anaphylaxis.  [name] estradiol (VIVELLE-DOT) 0.1 MG/24HR patch Place 1 patch (0.1 mg total) onto the skin 2 (two) times a week.  . famotidine (PEPCID) 20 MG tablet Take 1 tablet (20 mg total) by mouth daily as needed for heartburn or indigestion.  . fexofenadine (ALLEGRA ALLERGY) 180 MG tablet Take 1 tablet (180 mg total) by mouth daily.  [name] levothyroxine (SYNTHROID) 50 MCG tablet Take 1 tablet (50 mcg total) by mouth daily before breakfast.  . losartan (COZAAR) 50 MG tablet Take 1 tablet (50 mg total) by mouth at bedtime.  [name] MAGNESIUM PO Take 250 mg by mouth daily.  . meclizine (ANTIVERT) 25 MG tablet Take 1 tablet (25 mg total) by mouth 3 (three) times daily as needed.  . metoprolol succinate (TOPROL-XL) 50 MG 24 hr tablet Take 1 tablet (50 mg total) by mouth daily.  . montelukast (SINGULAIR) 10 MG tablet Take 1 tablet (10 mg total) by mouth at bedtime.  . Multiple Vitamin (MULTIVITAMIN) tablet Take 1 tablet by mouth daily.  [name] omega-3 acid ethyl esters (LOVAZA) 1 g capsule Take 1 capsule (1 g total) by mouth daily.  . ondansetron (ZOFRAN ODT) 4 MG disintegrating tablet Take 1 tablet (4 mg total) by mouth every 8 (eight) hours as needed for nausea or vomiting.  . predniSONE (DELTASONE) 10 MG tablet 4 tabs for 2 days, then 3 tabs for 2 days, 2 tabs for 2 days, then 1 tab for 2 days, then stop. Take in the morning with food  . Propylene Glycol (SYSTANE BALANCE) 0.6 % SOLN Apply 1 drop to eye as needed.  . psyllium (METAMUCIL) 58.6 % powder Take 1 packet by mouth as needed.  [name] Spacer/Aero-Holding Chambers (AEROCHAMBER MV) inhaler Use as instructed  . Syringe/Needle, Disp, (SYRINGE 3CC/20GX1") 20G X 1" 3 ML MISC 1 Syringe by Does not apply route every 30 (thirty) days. For use with  b-12 injection monthly  . tretinoin (RETIN-A) 0.05 % cream Apply topically at bedtime.  . urea (CARMOL) 10 % cream Apply topically as needed.  . nystatin  (MYCOSTATIN) 100000 UNIT/ML suspension Take 5 mLs (500,000 Units total) by mouth 4 (four) times daily.   No facility-administered encounter medications on file as of 01/30/2020.     Review of Systems  Review of Systems  Constitutional: Positive for fatigue. Negative for activity change and fever.  HENT: Negative for sinus pressure, sinus pain and sore throat.   Respiratory: Positive for cough and shortness of breath. Negative for wheezing.   Cardiovascular: Negative for chest pain and palpitations.  Gastrointestinal: Negative for diarrhea, nausea and vomiting.  Musculoskeletal: Negative for arthralgias.  Neurological: Negative for dizziness.  Psychiatric/Behavioral: Negative for sleep disturbance. The patient is not nervous/anxious.      Physical Exam  BP 120/80 (BP Location: Left Arm, Cuff Size: Normal)   Pulse 83   Temp (!) 97 F (36.1 C) (Temporal)   Ht 5\' 5"  (1.651 m)   Wt 180 lb 9.6 oz (81.9 kg)   SpO2 98%   BMI 30.05 kg/m   Wt Readings from Last 5 Encounters:  01/30/20 180 lb 9.6 oz (81.9 kg)  12/06/19 177 lb (80.3 kg)  11/08/19 174 lb (78.9 kg)  12/01/18 180 lb (81.6 kg)  09/16/18 182 lb 3.2 oz (82.6 kg)    BMI Readings from Last 5 Encounters:  01/30/20 30.05 kg/m  12/06/19 29.45 kg/m  11/08/19 28.96 kg/m  12/01/18 29.95 kg/m  09/16/18 30.32 kg/m     Physical Exam Vitals and nursing note reviewed.  Constitutional:      General: Angela Mendoza is not in acute distress.    Appearance: Normal appearance.  HENT:     Head: Normocephalic and atraumatic.     Right Ear: Tympanic membrane, ear canal and external ear normal. There is no impacted cerumen.     Left Ear: Tympanic membrane, ear canal and external ear normal. There is no impacted cerumen.     Nose: Congestion and rhinorrhea present.     Mouth/Throat:     Mouth: Mucous membranes are moist.     Pharynx: Oropharynx is clear.     Comments: Candida  Eyes:     Pupils: Pupils are equal, round, and reactive to  light.  Cardiovascular:     Rate and Rhythm: Normal rate and regular rhythm.     Pulses: Normal pulses.     Heart sounds: Normal heart sounds. No murmur.  Pulmonary:     Effort: Pulmonary effort is normal. No respiratory distress.     Breath sounds: Normal breath sounds. No decreased air movement. No decreased breath sounds, wheezing or rales.  Musculoskeletal:     Cervical back: Normal range of motion.  Skin:    General: Skin is warm and dry.     Capillary Refill: Capillary refill takes less than 2 seconds.  Neurological:     General: No focal deficit present.     Mental Status: Angela Mendoza is alert and oriented to person, place, and time. Mental status is at baseline.     Gait: Gait (Tolerated walk in office with no oxygen desaturations) normal.  Psychiatric:        Mood and Affect: Mood normal.        Behavior: Behavior normal.        Thought Content: Thought content normal.        Judgment: Judgment normal.       Assessment &  Plan:   Educated About Covid-19 Virus Infection Plan: Reviewed importance of physical distancing Handwashing Wearing a mask   Asthma, intrinsic Plan: Continue Qvar Continue allergic rhinitis regimen May need to consider referral back to ear nose and throat or allergy if patient continues to have issues  OSA on CPAP Plan: Continue CPAP therapy.  CPAP compliance report today shows excellent compliance.  Seasonal and perennial allergic rhinitis Plan: Continue nasal saline rinses twice daily Continue Dymista nasal spray Continue Allegra Can add Chlortab at night to help with nasal congestion and allergy symptoms   GERD (gastroesophageal reflux disease) Plan: Continue Pepcid GERD recommendations and lifestyle changes  Thrush White patchy areas in the back of throat on exam today  Plan: Counseled on importance of rinsing mouth out after using Qvar Nystatin rinse today    Return in about 3 months (around 04/30/2020), or if symptoms worsen  or fail to improve, for Follow up with Wyn Quaker FNP-C.   Lauraine Rinne, NP 01/30/2020   This appointment required 42 minutes of patient care (this includes precharting, chart review, review of results, face-to-face care, etc.).

## 2020-01-30 ENCOUNTER — Other Ambulatory Visit: Payer: Self-pay

## 2020-01-30 ENCOUNTER — Encounter: Payer: Self-pay | Admitting: Pulmonary Disease

## 2020-01-30 ENCOUNTER — Ambulatory Visit (INDEPENDENT_AMBULATORY_CARE_PROVIDER_SITE_OTHER): Payer: Medicare Other | Admitting: Pulmonary Disease

## 2020-01-30 VITALS — BP 120/80 | HR 83 | Temp 97.0°F | Ht 65.0 in | Wt 180.6 lb

## 2020-01-30 DIAGNOSIS — J3089 Other allergic rhinitis: Secondary | ICD-10-CM | POA: Diagnosis not present

## 2020-01-30 DIAGNOSIS — R058 Other specified cough: Secondary | ICD-10-CM

## 2020-01-30 DIAGNOSIS — Z7189 Other specified counseling: Secondary | ICD-10-CM

## 2020-01-30 DIAGNOSIS — B370 Candidal stomatitis: Secondary | ICD-10-CM | POA: Diagnosis not present

## 2020-01-30 DIAGNOSIS — R05 Cough: Secondary | ICD-10-CM

## 2020-01-30 DIAGNOSIS — Z9989 Dependence on other enabling machines and devices: Secondary | ICD-10-CM

## 2020-01-30 DIAGNOSIS — K219 Gastro-esophageal reflux disease without esophagitis: Secondary | ICD-10-CM | POA: Diagnosis not present

## 2020-01-30 DIAGNOSIS — J45909 Unspecified asthma, uncomplicated: Secondary | ICD-10-CM | POA: Diagnosis not present

## 2020-01-30 DIAGNOSIS — G4733 Obstructive sleep apnea (adult) (pediatric): Secondary | ICD-10-CM | POA: Diagnosis not present

## 2020-01-30 DIAGNOSIS — J302 Other seasonal allergic rhinitis: Secondary | ICD-10-CM

## 2020-01-30 MED ORDER — NYSTATIN 100000 UNIT/ML MT SUSP: 5.0000 mL | Freq: Four times a day (QID) | OROMUCOSAL | 0 refills | Status: DC

## 2020-01-30 NOTE — Assessment & Plan Note (Signed)
Plan: Continue CPAP therapy.  CPAP compliance report today shows excellent compliance.

## 2020-01-30 NOTE — Assessment & Plan Note (Signed)
White patchy areas in the back of throat on exam today  Plan: Counseled on importance of rinsing mouth out after using Qvar Nystatin rinse today

## 2020-01-30 NOTE — Assessment & Plan Note (Signed)
Plan: Continue Qvar Continue allergic rhinitis regimen May need to consider referral back to ear nose and throat or allergy if patient continues to have issues

## 2020-01-30 NOTE — Assessment & Plan Note (Signed)
Plan: Continue nasal saline rinses twice daily Continue Dymista nasal spray Continue Allegra Can add Chlortab at night to help with nasal congestion and allergy symptoms

## 2020-01-30 NOTE — Assessment & Plan Note (Signed)
Plan: Reviewed importance of physical distancing Handwashing Wearing a mask

## 2020-01-30 NOTE — Patient Instructions (Addendum)
You were seen today by Lauraine Rinne, NP  for:   1. Upper airway cough syndrome  Continue to avoid known triggers  When clearing out dusty areas in your house please wear a mask  Please utilize nasal saline rinses when coming in from outside  2. Asthma, intrinsic  Continue Qvar  Walk today in office, he did well without any oxygen desaturations  Okay to finish prednisone  Only use your albuterol as a rescue medication to be used if you can't catch your breath by resting or doing a relaxed purse lip breathing pattern.  - The less you use it, the better it will work when you need it. - Ok to use up to 2 puffs  every 4 hours if you must but call for immediate appointment if use goes up over your usual need - Don't leave home without it !!  (think of it like the spare tire for your car)   3. Seasonal and perennial allergic rhinitis  Continue daily Allegra  Continue Singulair  Continue nasal saline rinses twice daily  Continue Dymista after nasal saline rinses  Can consider taking chlorpheniramine (aka Chlor tabs) 4 mg tablet (1 to 2 tablets at night) for management of allergies and postnasal drip at night >>> This is an over-the-counter medication >>> This medication is sedating  4. Gastroesophageal reflux disease without esophagitis  Continue Pepcid  GERD management: >>>Avoid laying flat until 2 hours after meals >>>Elevate head of the bed including entire chest >>>Reduce size of meals and amount of fat, acid, spices, caffeine and sweets >>>If you are smoking, Please stop! >>>Decrease alcohol consumption >>>Work on maintaining a healthy weight with normal BMI     5. OSA on CPAP  We recommend that you continue using your CPAP daily >>>Keep up the hard work using your device >>> Goal should be wearing this for the entire night that you are sleeping, at least 4 to 6 hours  Remember:  . Do not drive or operate heavy machinery if tired or drowsy.  . Please notify the  supply company and office if you are unable to use your device regularly due to missing supplies or machine being broken.  . Work on maintaining a healthy weight and following your recommended nutrition plan  . Maintain proper daily exercise and movement  . Maintaining proper use of your device can also help improve management of other chronic illnesses such as: Blood pressure, blood sugars, and weight management.   BiPAP/ CPAP Cleaning:  >>>Clean weekly, with Dawn soap, and bottle brush.  Set up to air dry. >>> Wipe mask out daily with wet wipe or towelette   Follow Up:    Return in about 3 months (around 04/30/2020), or if symptoms worsen or fail to improve, for Follow up with Wyn Quaker FNP-C.    Please do your part to reduce the spread of COVID-19:      Reduce your risk of any infection  and COVID19 by using the similar precautions used for avoiding the common cold or flu:  [name] Wash your hands often with soap and warm water for at least 20 seconds.  If soap and water are not readily available, use an alcohol-based hand sanitizer with at least 60% alcohol.  . If coughing or sneezing, cover your mouth and nose by coughing or sneezing into the elbow areas of your shirt or coat, into a tissue or into your sleeve (not your hands). Langley Gauss A MASK when in public  .  Avoid shaking hands with others and consider head nods or verbal greetings only. . Avoid touching your eyes, nose, or mouth with unwashed hands.  . Avoid close contact with people who are sick. . Avoid places or events with large numbers of people in one location, like concerts or sporting events. . If you have some symptoms but not all symptoms, continue to monitor at home and seek medical attention if your symptoms worsen. . If you are having a medical emergency, call 911.   [place] / e-Visit: [email or web address]         [facility]: [facility]: [id number]                   [facility]: [id number]     It is flu season:   >>> Best ways to protect herself from the flu: Receive the yearly flu vaccine, practice good hand hygiene washing with soap and also using hand sanitizer when available, eat a nutritious meals, get adequate rest, hydrate appropriately   Please contact the office if your symptoms worsen or you have concerns that you are not improving.   Thank you for choosing [facility] for your healthcare, and for allowing [place] to partner with you on your healthcare journey. I am thankful to be able to provide care to you today.   Wyn Quaker FNP-C

## 2020-01-30 NOTE — Assessment & Plan Note (Signed)
Plan: Continue Pepcid GERD recommendations and lifestyle changes

## 2020-01-31 NOTE — Progress Notes (Signed)
Reviewed and agree with assessment/plan.   Lowry Bala, MD [facility] 10/22/2016, 12:24 PM Pager:  [phone]  

## 2020-04-02 ENCOUNTER — Telehealth: Payer: Self-pay | Admitting: Pulmonary Disease

## 2020-04-02 ENCOUNTER — Other Ambulatory Visit: Payer: Self-pay | Admitting: Vascular Surgery

## 2020-04-02 DIAGNOSIS — Z1231 Encounter for screening mammogram for malignant neoplasm of breast: Secondary | ICD-10-CM

## 2020-04-02 NOTE — Telephone Encounter (Signed)
VS is it ok to tell pt she can receive the shingles vaccine? Do you know what side effects pt may have?

## 2020-04-03 NOTE — Telephone Encounter (Signed)
She is a candidate for the shingrix shingles vaccine.  She can proceed with getting vaccine at her discretion.

## 2020-04-03 NOTE — Telephone Encounter (Signed)
Called pt and advised message from the provider. Pt understood and verbalized understanding. Nothing further is needed.    

## 2020-04-12 ENCOUNTER — Other Ambulatory Visit: Payer: Self-pay

## 2020-04-12 ENCOUNTER — Ambulatory Visit
Admission: RE | Admit: 2020-04-12 | Discharge: 2020-04-12 | Disposition: A | Discharge status: Home / Self Care | Payer: Medicare Other | Source: Ambulatory Visit | Attending: Vascular Surgery | Admitting: Vascular Surgery

## 2020-04-12 DIAGNOSIS — Z1231 Encounter for screening mammogram for malignant neoplasm of breast: Secondary | ICD-10-CM

## 2020-04-17 ENCOUNTER — Other Ambulatory Visit: Payer: Self-pay | Admitting: Internal Medicine

## 2020-04-20 ENCOUNTER — Other Ambulatory Visit: Payer: Self-pay | Admitting: Internal Medicine

## 2020-05-04 ENCOUNTER — Ambulatory Visit: Payer: Medicare Other | Admitting: Pulmonary Disease

## 2020-05-08 ENCOUNTER — Encounter: Payer: Self-pay | Admitting: Pulmonary Disease

## 2020-05-08 ENCOUNTER — Other Ambulatory Visit: Payer: Self-pay

## 2020-05-08 ENCOUNTER — Ambulatory Visit (INDEPENDENT_AMBULATORY_CARE_PROVIDER_SITE_OTHER): Payer: Medicare Other | Admitting: Pulmonary Disease

## 2020-05-08 VITALS — BP 126/70 | HR 70 | Temp 98.1°F | Ht 65.5 in | Wt 184.2 lb

## 2020-05-08 DIAGNOSIS — K2100 Gastro-esophageal reflux disease with esophagitis, without bleeding: Secondary | ICD-10-CM | POA: Diagnosis not present

## 2020-05-08 DIAGNOSIS — K219 Gastro-esophageal reflux disease without esophagitis: Secondary | ICD-10-CM

## 2020-05-08 MED ORDER — FLUCONAZOLE 100 MG PO TABS: ORAL_TABLET | ORAL | 0 refills | Status: AC

## 2020-05-08 MED ORDER — FAMOTIDINE 20 MG PO TABS: 20.0000 mg | ORAL_TABLET | Freq: Every day | ORAL | 3 refills | Status: DC | PRN

## 2020-05-08 MED ORDER — QVAR REDIHALER 80 MCG/ACT IN AERB: 2.0000 | INHALATION_SPRAY | Freq: Two times a day (BID) | RESPIRATORY_TRACT | 3 refills | Status: DC

## 2020-05-08 MED ORDER — MONTELUKAST SODIUM 10 MG PO TABS: 10.0000 mg | ORAL_TABLET | Freq: Every day | ORAL | 3 refills | Status: DC

## 2020-05-08 MED ORDER — ALBUTEROL SULFATE HFA 108 (90 BASE) MCG/ACT IN AERS: 2.0000 | INHALATION_SPRAY | Freq: Four times a day (QID) | RESPIRATORY_TRACT | 2 refills | Status: DC | PRN

## 2020-05-08 NOTE — Progress Notes (Signed)
f °

## 2020-05-08 NOTE — Patient Instructions (Signed)
Fluconazole 200 mg on day one, then 100 mg daily for next 6 days  Follow up in 6 months

## 2020-05-08 NOTE — Progress Notes (Signed)
[name]  Chief Complaint  Patient presents with  . Follow-up    No complaints    Constitutional:  BP 126/70 (BP Location: Right Arm, Cuff Size: Normal)   Pulse 70   Temp 98.1 F (36.7 C) (Oral)   Ht 5' 5.5" (1.664 m)   Wt 184 lb 3.2 oz (83.6 kg)   SpO2 99%   BMI 30.19 kg/m   Past Medical History:  Vit D deficiency, Vertigo, Rosacea, Hypothyroidism, HTN, HLD, GERD, Diverticulosis, Depression, HA  Brief Summary:  Angela Mendoza is a 69 y.o. female former smoker with OSA and asthma.  Subjective:  Still having throat irritation.  Noticed bruising in arm since being on prednisone.  Having trouble with mask fit for CPAP machine.  Her previous mask type is no longer available.  Wakes up about 1 time per week feeling short of breath.  Still very concerned about risk of COVID exposure.  She did compete Moderna vaccine.  Physical Exam:   Appearance - well kempt   ENMT - no sinus tenderness, white oral exudate posterior pharynx, no LAN, Mallampati 2 airway, no stridor  Respiratory - equal breath sounds bilaterally, no wheezing or rales  CV - s1s2 regular rate and rhythm, no murmurs  Ext - no clubbing, no edema  Skin - no rashes  Psych - normal mood and affect    Assessment/Plan:   Allergic asthma. - continue qvar, singulair, prn albuterol - if her nocturnal symptoms progress, then consider adding a LABA  Thrush. - discussed importance of rinsing mouth after using ICS - diflucan 200 mg on day 1, then 100 mg daily for next 6 days  Upper airway cough with allergic rhinitis. - continue dymista, singulair - prn allegra, nasal irrigation  Obstructive sleep apnea. - she is compliant with CPAP - continue CPAP 11 cm H2O - she will call if she is still having trouble with mask fit  Advice given about COVID 19 infection. - had extensive discussion about risks of COVID infection now that she and her husband are fully  vaccinated - explained her risk of infection to the point of needing therapy/hospitalization is very low  A total of  47 minutes spent addressing patient care issues on day of visit.  Follow up:   Patient Instructions  Fluconazole 200 mg on day one, then 100 mg daily for next 6 days  Follow up in 6 months  Signature:  Chesley Mires, MD [place] Pager: [phone] 05/08/2020, 1:26 PM  Flow Sheet     Pulmonary tests:   Spirometry 12/12 >> FEV1% 68  RAST 11/15/14 >> IgE 30, cats/mold  PFT 02/07/16 >> FEV1 2.32 (92%), FEV1% 76, FEF 25-75% 1.56 (71%), TLC 4.25 (81%), DLCO 121, borderline BD from FEF 25-75  FeNO 12/01/18 >> 7  Sleep tests:   PSG 09/06/07 >> AHI 19  CPAP 04/07/20 to 05/06/20 >> used on 30 of 30 nights with average 8 hrs 32 min.  Average AHI 0.7 with CPAP 11 cm H2O  Medications:   Allergies as of 05/08/2020      Reactions   Codeine    Low blood pressure/nausea   Sulfonamide Derivatives    rash   Estradiol Rash   Generic estradiol patch d/t the adhesive      Medication List       Accurate as of May 08, 2020  1:26 PM. If you have any questions, ask your nurse or doctor.  STOP taking these medications   nystatin 100000 UNIT/ML suspension Commonly known as: MYCOSTATIN Stopped by: Chesley Mires, MD   predniSONE 10 MG tablet Commonly known as: DELTASONE Stopped by: Chesley Mires, MD     TAKE these medications   AeroChamber MV inhaler Use as instructed   albuterol 108 (90 Base) MCG/ACT inhaler Commonly known as: Proventil HFA Inhale 2 puffs into the lungs every 6 (six) hours as needed.   ALPRAZolam 0.25 MG tablet Commonly known as: XANAX TAKE 1 TABLET (0.25 MG TOTAL) BY MOUTH ONCE DAILY AS NEEDED FOR SLEEP.   aspirin 81 MG tablet Take 81 mg by mouth once a week.   atorvastatin 40 MG tablet Commonly known as: LIPITOR Take 1 tablet (40 mg total) by mouth daily.   Azelastine-Fluticasone 137-50 MCG/ACT  Susp Commonly known as: Dymista Place 1 spray into the nose 2 (two) times daily.   Coenzyme Q10 300 MG Caps Take 1 capsule by mouth daily.   conjugated estrogens vaginal cream Commonly known as: PREMARIN Place 1 Applicatorful vaginally 2 (two) times a week. 90 day supply,  Refill for one year   cyanocobalamin 1000 MCG/ML injection Commonly known as: (VITAMIN B-12) Inject 1 mL (1,000 mcg total) into the muscle every 30 (thirty) days.   cycloSPORINE 0.05 % ophthalmic emulsion Commonly known as: RESTASIS Place 1 drop into both eyes 2 (two) times daily.   diazepam 5 MG tablet Commonly known as: VALIUM TAKE 1 TABLET BY MOUTH EVERY 6 HOURS AS NEEDED FOR VERTIGO   EPINEPHrine 0.3 mg/0.3 mL Soaj injection Commonly known as: EPI-PEN Inject 0.3 mg into the muscle as needed for anaphylaxis.   estradiol 0.1 MG/24HR patch Commonly known as: VIVELLE-DOT Place 1 patch (0.1 mg total) onto the skin 2 (two) times a week.   famotidine 20 MG tablet Commonly known as: PEPCID Take 1 tablet (20 mg total) by mouth daily as needed for heartburn or indigestion.   fexofenadine 180 MG tablet Commonly known as: Allegra Allergy Take 1 tablet (180 mg total) by mouth daily.   fluconazole 100 MG tablet Commonly known as: DIFLUCAN Take 2 tablets (200 mg total) by mouth daily for 1 day, THEN 1 tablet (100 mg total) daily for 6 days. Start taking on: May 08, 2020 Started by: Chesley Mires, MD   levothyroxine 50 MCG tablet Commonly known as: Synthroid Take 1 tablet (50 mcg total) by mouth daily before breakfast.   losartan 50 MG tablet Commonly known as: COZAAR Take 1 tablet (50 mg total) by mouth at bedtime.   MAGNESIUM PO Take 250 mg by mouth daily.   meclizine 25 MG tablet Commonly known as: ANTIVERT Take 1 tablet (25 mg total) by mouth 3 (three) times daily as needed.   metoprolol succinate 50 MG 24 hr tablet Commonly known as: TOPROL-XL Take 1 tablet (50 mg total) by mouth daily.    montelukast 10 MG tablet Commonly known as: SINGULAIR Take 1 tablet (10 mg total) by mouth at bedtime.   multivitamin tablet Take 1 tablet by mouth daily.   omega-3 acid ethyl esters 1 g capsule Commonly known as: LOVAZA Take 1 capsule (1 g total) by mouth daily.   ondansetron 4 MG disintegrating tablet Commonly known as: Zofran ODT Take 1 tablet (4 mg total) by mouth every 8 (eight) hours as needed for nausea or vomiting.   psyllium 58.6 % powder Commonly known as: METAMUCIL Take 1 packet by mouth as needed.   Qvar RediHaler 80 MCG/ACT inhaler Generic drug: beclomethasone  Inhale 2 puffs into the lungs 2 (two) times daily.   STOOL SOFTENER PO Take 1 tablet by mouth as needed.   SYRINGE 3CC/20GX1" 20G X 1" 3 ML Misc 1 Syringe by Does not apply route every 30 (thirty) days. For use with b-12 injection monthly   Systane Balance 0.6 % Soln Generic drug: Propylene Glycol Apply 1 drop to eye as needed.   tretinoin 0.05 % cream Commonly known as: RETIN-A APPLY TOPICALLY AT BEDTIME   urea 10 % cream Commonly known as: CARMOL APPLY TOPICALLY AS NEEDED   vitamin C with rose hips 1000 MG tablet Take 1,000 mg by mouth daily.   Vitamin D3 50 MCG (2000 UT) Tabs Take 1 tablet by mouth daily.   Vitamin-B Complex Tabs Take 1 tablet by mouth daily.       Past Surgical History:  She  has a past surgical history that includes Cholecystectomy (2007); Appendectomy (2007); Tubal ligation; Total abdominal hysterectomy (1991); Nasal sinus surgery; Breast enhancement surgery (2006); Cardiovascular stress test (2013); Colonoscopy (08/2012); Esophagogastroduodenoscopy (08/2012); US ECHOCARDIOGRAPHY (09/2009); US ECHOCARDIOGRAPHY (11/2007); sleep study (08/2007); Rectocele repair (12/2008); Colonoscopy (2006); Breast biopsy (Left, 09/27/2009); and Hernia repair.  Family History:  Her family history includes AAA (abdominal aortic aneurysm) in her paternal uncle; Aortic dissection in her  maternal aunt and paternal uncle; Breast cancer in her maternal aunt; Cancer in her cousin and paternal aunt; Cancer (age of onset: 92) in her father; Cancer (age of onset: 15) in her mother; Heart attack in her paternal uncle; Heart disease in her paternal uncle; Heart failure in her sister; Hypertension in her sister; Stroke in her paternal uncle and sister.  Social History:  She  reports that she quit smoking about 46 years ago. Her smoking use included cigarettes. She has a 2.10 pack-year smoking history. She has never used smokeless tobacco. She reports previous alcohol use. She reports that she does not use drugs.

## 2020-05-14 ENCOUNTER — Ambulatory Visit: Payer: Medicare Other | Admitting: Internal Medicine

## 2020-05-14 ENCOUNTER — Telehealth: Payer: Self-pay | Admitting: Pulmonary Disease

## 2020-05-14 NOTE — Telephone Encounter (Signed)
Spoke with the pt  I advised her singulair, albuterol inhaler, qvar and pepcid have already been sent to [facility] on 05/08/20 She verbalized understanding  Nothing further needed

## 2020-05-15 ENCOUNTER — Encounter: Payer: Self-pay | Admitting: Nurse Practitioner

## 2020-05-15 ENCOUNTER — Other Ambulatory Visit: Payer: Self-pay

## 2020-05-15 ENCOUNTER — Ambulatory Visit (INDEPENDENT_AMBULATORY_CARE_PROVIDER_SITE_OTHER): Payer: Medicare Other | Admitting: Nurse Practitioner

## 2020-05-15 VITALS — BP 144/80 | HR 72 | Temp 97.7°F | Ht 65.5 in | Wt 180.0 lb

## 2020-05-15 DIAGNOSIS — H9202 Otalgia, left ear: Secondary | ICD-10-CM | POA: Diagnosis not present

## 2020-05-15 DIAGNOSIS — J3089 Other allergic rhinitis: Secondary | ICD-10-CM

## 2020-05-15 NOTE — Patient Instructions (Addendum)
Afrin use for 3 days only to see if it helps the left ear pain by opening eustachian tube.  Continue with your allergy and meclizine.  Monitor for any signs of upper respiratory infection and if occurs- call the office for virtual. Follow up with ENT- as needed.     Allergic Rhinitis, Adult Allergic rhinitis is an allergic reaction that affects the mucous membrane inside the nose. It causes sneezing, a runny or stuffy nose, and the feeling of mucus going down the back of the throat (postnasal drip). Allergic rhinitis can be mild to severe. There are two types of allergic rhinitis:  Seasonal. This type is also called hay fever. It happens only during certain seasons.  Perennial. This type can happen at any time of the year. What are the causes? This condition happens when the body's defense system (immune system) responds to certain harmless substances called allergens as though they were germs.  Seasonal allergic rhinitis is triggered by pollen, which can come from grasses, trees, and weeds. Perennial allergic rhinitis may be caused by:  House dust mites.  Pet dander.  Mold spores. What are the signs or symptoms? Symptoms of this condition include:  Sneezing.  Runny or stuffy nose (nasal congestion).  Postnasal drip.  Itchy nose.  Tearing of the eyes.  Trouble sleeping.  Daytime sleepiness. How is this diagnosed? This condition may be diagnosed based on:  Your medical history.  A physical exam.  Tests to check for related conditions, such as: ? Asthma. ? Pink eye. ? Ear infection. ? Upper respiratory infection.  Tests to find out which allergens trigger your symptoms. These may include skin or blood tests. How is this treated? There is no cure for this condition, but treatment can help control symptoms. Treatment may include:  Taking medicines that block allergy symptoms, such as antihistamines. Medicine may be given as a shot, nasal spray, or  pill.  Avoiding the allergen.  Desensitization. This treatment involves getting ongoing shots until your body becomes less sensitive to the allergen. This treatment may be done if other treatments do not help.  If taking medicine and avoiding the allergen does not work, new, stronger medicines may be prescribed. Follow these instructions at home:  Find out what you are allergic to. Common allergens include smoke, dust, and pollen.  Avoid the things you are allergic to. These are some things you can do to help avoid allergens: ? Replace carpet with wood, tile, or vinyl flooring. Carpet can trap dander and dust. ? Do not smoke. Do not allow smoking in your home. ? Change your heating and air conditioning filter at least once a month. ? During allergy season:  Keep windows closed as much as possible.  Plan outdoor activities when pollen counts are lowest. This is usually during the evening hours.  When coming indoors, change clothing and shower before sitting on furniture or bedding.  Take over-the-counter and prescription medicines only as told by your health care provider.  Keep all follow-up visits as told by your health care provider. This is important. Contact a health care provider if:  You have a fever.  You develop a persistent cough.  You make whistling sounds when you breathe (you wheeze).  Your symptoms interfere with your normal daily activities. Get help right away if:  You have shortness of breath. Summary  This condition can be managed by taking medicines as directed and avoiding allergens.  Contact your health care provider if you develop a persistent cough  or fever.  During allergy season, keep windows closed as much as possible. This information is not intended to replace advice given to you by your health care provider. Make sure you discuss any questions you have with your health care provider. Document Revised: 09/25/2017 Document Reviewed:  11/20/2016 Elsevier Patient Education  2020 [employer].

## 2020-05-15 NOTE — Progress Notes (Signed)
Established Patient Office Visit  Subjective:  Patient ID: Angela Mendoza, female    DOB: [birth year]  Age: 69 y.o. MRN: [id number]  CC:  Chief Complaint  Patient presents with  . Acute Visit    ear pain/vertigo    HPI PATIRICA Mendoza presents for left ear muffled and sharp  pains off and on with waves of vertigo. No full blown attacks. She has a hx of allergies and chronic PND and has been advised to get allergy shots.   Past Medical History:  Diagnosis Date  . ABNORMAL HEART RHYTHMS 06/09/2008  . Allergic rhinitis 06/09/2008   chronic  . Asthma, intrinsic 10/06/2011   controlled on qvar. [name] 09/2011:  Very mild airflow obstruction (FEV1% 68, FVL c/w obstruction)   . Chronic headache 06/09/2008  . COPD (chronic obstructive pulmonary disease) ([place])   . Depression   . Diverticulosis 2013   h/o diverticulitis  . Dry eyes   . GERD (gastroesophageal reflux disease)   . History of breast implant removal    silicone mastitis - R axilla silicone LN, free silicone L breast upper outer quadrant  . Hot flashes   . HYPERLIPIDEMIA 06/09/2008  . HYPERTENSION 06/09/2008  . Hypothyroidism   . MVP (mitral valve prolapse)   . OSA on CPAP    [name]  . Pernicious anemia    per prior pcp  . Rosacea   . Surgical menopause 1991  . Vertigo   . Vitamin D deficiency     Past Surgical History:  Procedure Laterality Date  . APPENDECTOMY  2007  . BREAST BIOPSY Left 09/27/2009  . BREAST ENHANCEMENT SURGERY  2006  . CARDIOVASCULAR STRESS TEST  2013   [place] [name] - WNL, EF 55-60%, with 0 calcium score  . CHOLECYSTECTOMY  2007   biliary dyskinesia  . COLONOSCOPY  08/2012   diverticulosis  . COLONOSCOPY  2006   inflamed hyperplastic polyp  . ESOPHAGOGASTRODUODENOSCOPY  08/2012   esophageal reflux  . HERNIA REPAIR    . NASAL SINUS SURGERY    . RECTOCELE REPAIR  12/2008   [name]  . sleep study  08/2007   OSA, 12 cm H2O,  . TOTAL ABDOMINAL HYSTERECTOMY  1991   heavy  bleeding, ovaries removed  . TUBAL LIGATION    . US ECHOCARDIOGRAPHY  09/2009   impaired relaxation, mild tric regurg, normal MV, EF 60%  . US ECHOCARDIOGRAPHY  11/2007   mild-mod MR    Family History  Problem Relation Age of Onset  . Cancer Mother 71       lung, passive smoke  . Cancer Father 33       lung, smoker  . Cancer Paternal Aunt        breast, lung  . Cancer Cousin        breast  . Stroke Sister        TIA  . Hypertension Sister   . Heart failure Sister   . Aortic dissection Paternal Uncle   . Heart attack Paternal Uncle   . AAA (abdominal aortic aneurysm) Paternal Uncle   . Aortic dissection Maternal Aunt   . Breast cancer Maternal Aunt   . Heart disease Paternal Uncle   . Stroke Paternal Uncle     Social History   Socioeconomic History  . Marital status: Married    Spouse name: Not on file  . Number of children: Not on file  . Years of education: Not on file  . Highest  education level: Not on file  Occupational History  . Occupation: retired  Tobacco Use  . Smoking status: Former Smoker    Packs/day: 0.30    Years: 7.00    Pack years: 2.10    Types: Cigarettes    Quit date: 10/27/1973    Years since quitting: 46.5  . Smokeless tobacco: Never Used  . 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 Hx of children who are grown  Vaping Use  . Vaping Use: Never used  Substance and Sexual Activity  . Alcohol use: Not Currently    Alcohol/week: 0.0 standard drinks  . Drug use: No  . Sexual activity: Not on file  Other Topics Concern  . Not on file  Social History Narrative   Lives with husband, cat   Grown children   Occ: retired, was Gaffer   Edu: trade degree then 2 yrscollege   Social Determinants of Radio broadcast assistant Strain:   . Difficulty of Paying Living Expenses:   Food Insecurity:   . Worried About Charity fundraiser in the Last Year:   . Arboriculturist in the Last Year:   Transportation  Needs:   . Film/video editor (Medical):   [name] Lack of Transportation (Non-Medical):   Physical Activity:   . Days of Exercise per Week:   . Minutes of Exercise per Session:   Stress:   . Feeling of Stress :   Social Connections:   . Frequency of Communication with Friends and Family:   . Frequency of Social Gatherings with Friends and Family:   . Attends Religious Services:   . Active Member of Clubs or Organizations:   . Attends Archivist Meetings:   [name] Marital Status:   Intimate Partner Violence:   . Fear of Current or Ex-Partner:   . Emotionally Abused:   [name] Physically Abused:   . Sexually Abused:     Outpatient Medications Prior to Visit  Medication Sig Dispense Refill  . albuterol (PROVENTIL HFA) 108 (90 Base) MCG/ACT inhaler Inhale 2 puffs into the lungs every 6 (six) hours as needed. 18 g 2  . ALPRAZolam (XANAX) 0.25 MG tablet TAKE 1 TABLET (0.25 MG TOTAL) BY MOUTH ONCE DAILY AS NEEDED FOR SLEEP. 30 tablet 2  . Ascorbic Acid (VITAMIN C WITH ROSE HIPS) 1000 MG tablet Take 1,000 mg by mouth daily.    [name] aspirin 81 MG tablet Take 81 mg by mouth once a week.     [name] atorvastatin (LIPITOR) 40 MG tablet Take 1 tablet (40 mg total) by mouth daily. 90 tablet 3  . Azelastine-Fluticasone (DYMISTA) 137-50 MCG/ACT SUSP Place 1 spray into the nose 2 (two) times daily. 23 g 3  . B Complex Vitamins (VITAMIN-B COMPLEX) TABS Take 1 tablet by mouth daily.    . beclomethasone (QVAR REDIHALER) 80 MCG/ACT inhaler Inhale 2 puffs into the lungs 2 (two) times daily. 31.8 g 3  . Cholecalciferol (VITAMIN D3) 2000 units TABS Take 1 tablet by mouth daily. 90 tablet 3  . Coenzyme Q10 300 MG CAPS Take 1 capsule by mouth daily.    [name] conjugated estrogens (PREMARIN) vaginal cream Place 1 Applicatorful vaginally 2 (two) times a week. 90 day supply,  Refill for one year 90 g 3  . cyanocobalamin (,VITAMIN B-12,) 1000 MCG/ML injection Inject 1 mL (1,000 mcg total) into the muscle every 30 (thirty)  days. 3 mL 3  . cycloSPORINE (RESTASIS) 0.05 % ophthalmic  emulsion Place 1 drop into both eyes 2 (two) times daily. 3 each 3  . diazepam (VALIUM) 5 MG tablet TAKE 1 TABLET BY MOUTH EVERY 6 HOURS AS NEEDED FOR VERTIGO 30 tablet 2  . Docusate Calcium (STOOL SOFTENER PO) Take 1 tablet by mouth as needed.    [name] EPINEPHrine 0.3 mg/0.3 mL IJ SOAJ injection Inject 0.3 mg into the muscle as needed for anaphylaxis.    [name] estradiol (VIVELLE-DOT) 0.1 MG/24HR patch Place 1 patch (0.1 mg total) onto the skin 2 (two) times a week. 24 patch 3  . famotidine (PEPCID) 20 MG tablet Take 1 tablet (20 mg total) by mouth daily as needed for heartburn or indigestion. 90 tablet 3  . fexofenadine (ALLEGRA ALLERGY) 180 MG tablet Take 1 tablet (180 mg total) by mouth daily. 90 tablet 3  . fluconazole (DIFLUCAN) 100 MG tablet Take 2 tablets (200 mg total) by mouth daily for 1 day, THEN 1 tablet (100 mg total) daily for 6 days. 8 tablet 0  . levothyroxine (SYNTHROID) 50 MCG tablet Take 1 tablet (50 mcg total) by mouth daily before breakfast. 90 tablet 3  . losartan (COZAAR) 50 MG tablet Take 1 tablet (50 mg total) by mouth at bedtime. 90 tablet 3  . MAGNESIUM PO Take 250 mg by mouth daily.    . meclizine (ANTIVERT) 25 MG tablet Take 1 tablet (25 mg total) by mouth 3 (three) times daily as needed. 270 tablet 3  . metoprolol succinate (TOPROL-XL) 50 MG 24 hr tablet Take 1 tablet (50 mg total) by mouth daily. 90 tablet 2  . montelukast (SINGULAIR) 10 MG tablet Take 1 tablet (10 mg total) by mouth at bedtime. 90 tablet 3  . Multiple Vitamin (MULTIVITAMIN) tablet Take 1 tablet by mouth daily.    [name] omega-3 acid ethyl esters (LOVAZA) 1 g capsule Take 1 capsule (1 g total) by mouth daily. 90 capsule 3  . ondansetron (ZOFRAN ODT) 4 MG disintegrating tablet Take 1 tablet (4 mg total) by mouth every 8 (eight) hours as needed for nausea or vomiting. 30 tablet 2  . Propylene Glycol (SYSTANE BALANCE) 0.6 % SOLN Apply 1 drop to eye as needed.     . psyllium (METAMUCIL) 58.6 % powder Take 1 packet by mouth as needed.    [name] Spacer/Aero-Holding Chambers (AEROCHAMBER MV) inhaler Use as instructed 1 each 3  . Syringe/Needle, Disp, (SYRINGE 3CC/20GX1") 20G X 1" 3 ML MISC 1 Syringe by Does not apply route every 30 (thirty) days. For use with b-12 injection monthly 50 each 0  . tretinoin (RETIN-A) 0.05 % cream APPLY TOPICALLY AT BEDTIME 45 g 1  . urea (CARMOL) 10 % cream APPLY TOPICALLY AS NEEDED 71 g 3   No facility-administered medications prior to visit.    Allergies  Allergen Reactions  . Codeine     Low blood pressure/nausea  . Sulfonamide Derivatives     rash  . Estradiol Rash    Generic estradiol patch d/t the adhesive    ROS Review of Systems  Constitutional: Negative.   HENT: Positive for ear pain.   Eyes: Negative.   Respiratory: Negative.   Cardiovascular: Negative.   Gastrointestinal: Negative.   Neurological: Negative.   Psychiatric/Behavioral: Negative.       Objective:    Physical Exam Vitals reviewed.  Constitutional:      Appearance: Normal appearance. She is normal weight.  HENT:     Head: Normocephalic and atraumatic.     Right Ear: Tympanic  membrane, ear canal and external ear normal.     Left Ear: Tympanic membrane, ear canal and external ear normal.     Mouth/Throat:     Mouth: Mucous membranes are moist.     Pharynx: Oropharynx is clear.  Eyes:     Conjunctiva/sclera: Conjunctivae normal.     Pupils: Pupils are equal, round, and reactive to light.     Comments: No nystagmus present  Cardiovascular:     Rate and Rhythm: Normal rate and regular rhythm.     Pulses: Normal pulses.     Heart sounds: Normal heart sounds.  Pulmonary:     Effort: Pulmonary effort is normal.     Breath sounds: Normal breath sounds.  Musculoskeletal:     Cervical back: Normal range of motion and neck supple.  Lymphadenopathy:     Cervical: No cervical adenopathy.  Skin:    General: Skin is warm and dry.    Neurological:     General: No focal deficit present.     Mental Status: She is alert and oriented to person, place, and time.     Cranial Nerves: No cranial nerve deficit.     Motor: No weakness.     Coordination: Coordination normal.     Gait: Gait normal.  Psychiatric:        Mood and Affect: Mood normal.        Behavior: Behavior normal.     BP (!) 144/80 (BP Location: Left Arm, Patient Position: Sitting, Cuff Size: Normal)   Pulse 72   Temp 97.7 F (36.5 C) (Oral)   Ht 5' 5.5" (1.664 m)   Wt 180 lb (81.6 kg)   SpO2 98%   BMI 29.50 kg/m  Wt Readings from Last 3 Encounters:  05/15/20 180 lb (81.6 kg)  05/08/20 184 lb 3.2 oz (83.6 kg)  01/30/20 180 lb 9.6 oz (81.9 kg)     There are no preventive care reminders to display for this patient.  There are no preventive care reminders to display for this patient.  Lab Results  Component Value Date   TSH 3.21 12/08/2019   Lab Results  Component Value Date   WBC 9.0 04/28/2018   HGB 13.5 04/28/2018   HCT 39.5 04/28/2018   MCV 92.3 04/28/2018   PLT 269 04/28/2018   Lab Results  Component Value Date   NA 138 12/08/2019   K 3.9 12/08/2019   CO2 29 12/08/2019   GLUCOSE 89 12/08/2019   BUN 9 12/08/2019   CREATININE 0.81 12/08/2019   BILITOT 1.1 12/08/2019   ALKPHOS 66 12/08/2019   AST 20 12/08/2019   ALT 23 12/08/2019   PROT 7.5 12/08/2019   ALBUMIN 4.4 12/08/2019   CALCIUM 9.5 12/08/2019   GFR 70.09 12/08/2019   Lab Results  Component Value Date   CHOL 170 12/08/2019   Lab Results  Component Value Date   HDL 65.70 12/08/2019   Lab Results  Component Value Date   LDLCALC 81 12/08/2019   Lab Results  Component Value Date   TRIG 115.0 12/08/2019   Lab Results  Component Value Date   CHOLHDL 3 12/08/2019   Lab Results  Component Value Date   HGBA1C 5.9 10/12/2015      Assessment & Plan:   Problem List Items Addressed This Visit      Respiratory   Non-seasonal allergic rhinitis     Other    Left ear pain - Primary      No orders of  the defined types were placed in this encounter. Afrin use for 3 days only to see if it helps the left ear pain by opening eustachian tube.  Continue with your allergy and meclizine.  Monitor for any signs of upper respiratory infection and if occurs- call the office for virtual. Follow up with ENT- as needed.   Follow-up: Return if symptoms worsen or fail to improve.   This visit occurred during the SARS-CoV-2 public health emergency.  Safety protocols were in place, including screening questions prior to the visit, additional usage of staff PPE, and extensive cleaning of exam room while observing appropriate contact time as indicated for disinfecting solutions.   Denice Paradise, NP

## 2020-05-23 ENCOUNTER — Other Ambulatory Visit: Payer: Self-pay

## 2020-05-23 ENCOUNTER — Ambulatory Visit (INDEPENDENT_AMBULATORY_CARE_PROVIDER_SITE_OTHER): Payer: Medicare Other

## 2020-05-23 ENCOUNTER — Ambulatory Visit: Payer: Medicare Other

## 2020-05-23 VITALS — Ht 65.5 in | Wt 180.0 lb

## 2020-05-23 DIAGNOSIS — Z0000 Encounter for general adult medical examination without abnormal findings: Secondary | ICD-10-CM

## 2020-05-23 NOTE — Patient Instructions (Addendum)
Ms. Tallo , Thank you for taking time to come for your Medicare Wellness Visit. I appreciate your ongoing commitment to your health goals. Please review the following plan we discussed and let me know if I can assist you in the future.   These are the goals we discussed: Goals      Patient Stated   .  Id like to lose about 3-5lbs (pt-stated)      Increase physical activity Raise heart rate while walking in place  Cut back on carb intake      Other   .  Low carb diet      Portion control       This is a list of the screening recommended for you and due dates:  Health Maintenance  Topic Date Due  . Flu Shot  05/27/2020  . Mammogram  04/12/2022  . Colon Cancer Screening  09/02/2022  . Tetanus Vaccine  02/26/2027  . DEXA scan (bone density measurement)  Completed  . COVID-19 Vaccine  Completed  .  Hepatitis C: One time screening is recommended by Center for Disease Control  (CDC) for  adults born from [birth year] through 1965.   Completed  . Pneumonia vaccines  Completed    Immunizations Immunization History  Administered Date(s) Administered  . Fluad Quad(high Dose 65+) 07/16/2019  . Influenza Split 07/15/2013, 08/04/2017  . Influenza Whole 07/27/2009, 07/28/2011, 07/21/2012  . Influenza, High Dose Seasonal PF 07/22/2017, 07/01/2018  . Influenza,inj,Quad PF,6+ Mos 08/03/2014, 07/12/2015, 07/14/2016  . Influenza-Unspecified 07/15/2013, 08/03/2014, 07/12/2015, 07/14/2016, 08/04/2017  . Moderna SARS-COVID-2 Vaccination 12/11/2019, 01/08/2020  . Pneumococcal Conjugate-13 11/08/2014  . Pneumococcal Polysaccharide-23 07/27/2009, 12/27/2015  . Td 10/27/2008  . Tdap 12/19/2013, 02/25/2017  . Tetanus 10/27/2008  . Zoster 10/27/2009  . Zoster Recombinat (Shingrix) 04/27/2020   Keep all routine maintenance appointments.   Follow up 05/28/20 @ 3:30  Advanced directives: declined  Conditions/risks identified: none new  Follow up in one year for your annual wellness visit     Preventive Care 65 Years and Older, Female Preventive care refers to lifestyle choices and visits with your health care provider that can promote health and wellness. What does preventive care include?  A yearly physical exam. This is also called an annual well check.  Dental exams once or twice a year.  Routine eye exams. Ask your health care provider how often you should have your eyes checked.  Personal lifestyle choices, including:  Daily care of your teeth and gums.  Regular physical activity.  Eating a healthy diet.  Avoiding tobacco and drug use.  Limiting alcohol use.  Practicing safe sex.  Taking low-dose aspirin every day.  Taking vitamin and mineral supplements as recommended by your health care provider. What happens during an annual well check? The services and screenings done by your health care provider during your annual well check will depend on your age, overall health, lifestyle risk factors, and family history of disease. Counseling  Your health care provider may ask you questions about your:  Alcohol use.  Tobacco use.  Drug use.  Emotional well-being.  Home and relationship well-being.  Sexual activity.  Eating habits.  History of falls.  Memory and ability to understand (cognition).  Work and work Statistician.  Reproductive health. Screening  You may have the following tests or measurements:  Height, weight, and BMI.  Blood pressure.  Lipid and cholesterol levels. These may be checked every 5 years, or more frequently if you are over 37 years old.  Skin check.  Lung cancer screening. You may have this screening every year starting at age 57 if you have a 30-pack-year history of smoking and currently smoke or have quit within the past 15 years.  Fecal occult blood test (FOBT) of the stool. You may have this test every year starting at age 15.  Flexible sigmoidoscopy or colonoscopy. You may have a sigmoidoscopy every 5 years or  a colonoscopy every 10 years starting at age 58.  Hepatitis C blood test.  Hepatitis B blood test.  Sexually transmitted disease (STD) testing.  Diabetes screening. This is done by checking your blood sugar (glucose) after you have not eaten for a while (fasting). You may have this done every 1-3 years.  Bone density scan. This is done to screen for osteoporosis. You may have this done starting at age 13.  Mammogram. This may be done every 1-2 years. Talk to your health care provider about how often you should have regular mammograms. Talk with your health care provider about your test results, treatment options, and if necessary, the need for more tests. Vaccines  Your health care provider may recommend certain vaccines, such as:  Influenza vaccine. This is recommended every year.  Tetanus, diphtheria, and acellular pertussis (Tdap, Td) vaccine. You may need a Td booster every 10 years.  Zoster vaccine. You may need this after age 17.  Pneumococcal 13-valent conjugate (PCV13) vaccine. One dose is recommended after age 52.  Pneumococcal polysaccharide (PPSV23) vaccine. One dose is recommended after age 54. Talk to your health care provider about which screenings and vaccines you need and how often you need them. This information is not intended to replace advice given to you by your health care provider. Make sure you discuss any questions you have with your health care provider. Document Released: 11/09/2015 Document Revised: 07/02/2016 Document Reviewed: 08/14/2015 Elsevier Interactive Patient Education  2017 [employer] Prevention in the Home Falls can cause injuries. They can happen to people of all ages. There are many things you can do to make your home safe and to help prevent falls. What can I do on the outside of my home?  Regularly fix the edges of walkways and driveways and fix any cracks.  Remove anything that might make you trip as you walk through a door,  such as a raised step or threshold.  Trim any bushes or trees on the path to your home.  Use bright outdoor lighting.  Clear any walking paths of anything that might make someone trip, such as rocks or tools.  Regularly check to see if handrails are loose or broken. Make sure that both sides of any steps have handrails.  Any raised decks and porches should have guardrails on the edges.  Have any leaves, snow, or ice cleared regularly.  Use sand or salt on walking paths during winter.  Clean up any spills in your garage right away. This includes oil or grease spills. What can I do in the bathroom?  Use night lights.  Install grab bars by the toilet and in the tub and shower. Do not use towel bars as grab bars.  Use non-skid mats or decals in the tub or shower.  If you need to sit down in the shower, use a plastic, non-slip stool.  Keep the floor dry. Clean up any water that spills on the floor as soon as it happens.  Remove soap buildup in the tub or shower regularly.  Attach bath mats securely  with double-sided non-slip rug tape.  Do not have throw rugs and other things on the floor that can make you trip. What can I do in the bedroom?  Use night lights.  Make sure that you have a light by your bed that is easy to reach.  Do not use any sheets or blankets that are too big for your bed. They should not hang down onto the floor.  Have a firm chair that has side arms. You can use this for support while you get dressed.  Do not have throw rugs and other things on the floor that can make you trip. What can I do in the kitchen?  Clean up any spills right away.  Avoid walking on wet floors.  Keep items that you use a lot in easy-to-reach places.  If you need to reach something above you, use a strong step stool that has a grab bar.  Keep electrical cords out of the way.  Do not use floor polish or wax that makes floors slippery. If you must use wax, use non-skid  floor wax.  Do not have throw rugs and other things on the floor that can make you trip. What can I do with my stairs?  Do not leave any items on the stairs.  Make sure that there are handrails on both sides of the stairs and use them. Fix handrails that are broken or loose. Make sure that handrails are as long as the stairways.  Check any carpeting to make sure that it is firmly attached to the stairs. Fix any carpet that is loose or worn.  Avoid having throw rugs at the top or bottom of the stairs. If you do have throw rugs, attach them to the floor with carpet tape.  Make sure that you have a light switch at the top of the stairs and the bottom of the stairs. If you do not have them, ask someone to add them for you. What else can I do to help prevent falls?  Wear shoes that:  Do not have high heels.  Have rubber bottoms.  Are comfortable and fit you well.  Are closed at the toe. Do not wear sandals.  If you use a stepladder:  Make sure that it is fully opened. Do not climb a closed stepladder.  Make sure that both sides of the stepladder are locked into place.  Ask someone to hold it for you, if possible.  Clearly mark and make sure that you can see:  Any grab bars or handrails.  First and last steps.  Where the edge of each step is.  Use tools that help you move around (mobility aids) if they are needed. These include:  Canes.  Walkers.  Scooters.  Crutches.  Turn on the lights when you go into a dark area. Replace any light bulbs as soon as they burn out.  Set up your furniture so you have a clear path. Avoid moving your furniture around.  If any of your floors are uneven, fix them.  If there are any pets around you, be aware of where they are.  Review your medicines with your doctor. Some medicines can make you feel dizzy. This can increase your chance of falling. Ask your doctor what other things that you can do to help prevent falls. This  information is not intended to replace advice given to you by your health care provider. Make sure you discuss any questions you have with your health care  provider. Document Released: 08/09/2009 Document Revised: 03/20/2016 Document Reviewed: 11/17/2014 Elsevier Interactive Patient Education  2017 [employer].

## 2020-05-23 NOTE — Progress Notes (Addendum)
Subjective:   Angela Mendoza is a 69 y.o. female who presents for Medicare Annual (Subsequent) preventive examination.  Review of Systems    No ROS.  Medicare Wellness Virtual Visit.  Cardiac Risk Factors include: advanced age (>36men, >2 women);hypertension     Objective:    Today's Vitals   05/23/20 1107  Weight: 180 lb (81.6 kg)  Height: 5' 5.5" (1.664 m)   Body mass index is 29.5 kg/m.  Advanced Directives 05/23/2020 05/23/2019 05/20/2018  Does Patient Have a Medical Advance Directive? No No No  Would patient like information on creating a medical advance directive? No - Patient declined Yes (MAU/Ambulatory/Procedural Areas - Information given) Yes (MAU/Ambulatory/Procedural Areas - Information given)    Current Medications (verified) Outpatient Encounter Medications as of 05/23/2020  Medication Sig   albuterol (PROVENTIL HFA) 108 (90 Base) MCG/ACT inhaler Inhale 2 puffs into the lungs every 6 (six) hours as needed.   ALPRAZolam (XANAX) 0.25 MG tablet TAKE 1 TABLET (0.25 MG TOTAL) BY MOUTH ONCE DAILY AS NEEDED FOR SLEEP.   Ascorbic Acid (VITAMIN C WITH ROSE HIPS) 1000 MG tablet Take 1,000 mg by mouth daily.   aspirin 81 MG tablet Take 81 mg by mouth once a week.    atorvastatin (LIPITOR) 40 MG tablet Take 1 tablet (40 mg total) by mouth daily.   Azelastine-Fluticasone (DYMISTA) 137-50 MCG/ACT SUSP Place 1 spray into the nose 2 (two) times daily.   B Complex Vitamins (VITAMIN-B COMPLEX) TABS Take 1 tablet by mouth daily.   beclomethasone (QVAR REDIHALER) 80 MCG/ACT inhaler Inhale 2 puffs into the lungs 2 (two) times daily.   Cholecalciferol (VITAMIN D3) 2000 units TABS Take 1 tablet by mouth daily.   Coenzyme Q10 300 MG CAPS Take 1 capsule by mouth daily.   conjugated estrogens (PREMARIN) vaginal cream Place 1 Applicatorful vaginally 2 (two) times a week. 90 day supply,  Refill for one year   cyanocobalamin (,VITAMIN B-12,) 1000 MCG/ML injection Inject 1 mL (1,000 mcg total)  into the muscle every 30 (thirty) days.   cycloSPORINE (RESTASIS) 0.05 % ophthalmic emulsion Place 1 drop into both eyes 2 (two) times daily.   diazepam (VALIUM) 5 MG tablet TAKE 1 TABLET BY MOUTH EVERY 6 HOURS AS NEEDED FOR VERTIGO   Docusate Calcium (STOOL SOFTENER PO) Take 1 tablet by mouth as needed.   EPINEPHrine 0.3 mg/0.3 mL IJ SOAJ injection Inject 0.3 mg into the muscle as needed for anaphylaxis.   estradiol (VIVELLE-DOT) 0.1 MG/24HR patch Place 1 patch (0.1 mg total) onto the skin 2 (two) times a week.   famotidine (PEPCID) 20 MG tablet Take 1 tablet (20 mg total) by mouth daily as needed for heartburn or indigestion.   fexofenadine (ALLEGRA ALLERGY) 180 MG tablet Take 1 tablet (180 mg total) by mouth daily.   levothyroxine (SYNTHROID) 50 MCG tablet Take 1 tablet (50 mcg total) by mouth daily before breakfast.   losartan (COZAAR) 50 MG tablet Take 1 tablet (50 mg total) by mouth at bedtime.   MAGNESIUM PO Take 250 mg by mouth daily.   meclizine (ANTIVERT) 25 MG tablet Take 1 tablet (25 mg total) by mouth 3 (three) times daily as needed.   metoprolol succinate (TOPROL-XL) 50 MG 24 hr tablet Take 1 tablet (50 mg total) by mouth daily.   montelukast (SINGULAIR) 10 MG tablet Take 1 tablet (10 mg total) by mouth at bedtime.   Multiple Vitamin (MULTIVITAMIN) tablet Take 1 tablet by mouth daily.   omega-3 acid ethyl  esters (LOVAZA) 1 g capsule Take 1 capsule (1 g total) by mouth daily.   ondansetron (ZOFRAN ODT) 4 MG disintegrating tablet Take 1 tablet (4 mg total) by mouth every 8 (eight) hours as needed for nausea or vomiting.   Propylene Glycol (SYSTANE BALANCE) 0.6 % SOLN Apply 1 drop to eye as needed.   psyllium (METAMUCIL) 58.6 % powder Take 1 packet by mouth as needed.   Spacer/Aero-Holding Chambers (AEROCHAMBER MV) inhaler Use as instructed   Syringe/Needle, Disp, (SYRINGE 3CC/20GX1") 20G X 1" 3 ML MISC 1 Syringe by Does not apply route every 30 (thirty) days. For use with b-12  injection monthly   tretinoin (RETIN-A) 0.05 % cream APPLY TOPICALLY AT BEDTIME   urea (CARMOL) 10 % cream APPLY TOPICALLY AS NEEDED   No facility-administered encounter medications on file as of 05/23/2020.    Allergies (verified) Codeine, Sulfonamide derivatives, and Estradiol   History: Past Medical History:  Diagnosis Date   ABNORMAL HEART RHYTHMS 06/09/2008   Allergic rhinitis 06/09/2008   chronic   Asthma, intrinsic 10/06/2011   controlled on qvar. [name] 09/2011:  Very mild airflow obstruction (FEV1% 68, FVL c/w obstruction)    Chronic headache 06/09/2008   COPD (chronic obstructive pulmonary disease) ([place])    Depression    Diverticulosis 2013   h/o diverticulitis   Dry eyes    GERD (gastroesophageal reflux disease)    History of breast implant removal    silicone mastitis - R axilla silicone LN, free silicone L breast upper outer quadrant   Hot flashes    HYPERLIPIDEMIA 06/09/2008   HYPERTENSION 06/09/2008   Hypothyroidism    MVP (mitral valve prolapse)    OSA on CPAP    [name]   Pernicious anemia    per prior pcp   Rosacea    Surgical menopause 1991   Vertigo    Vitamin D deficiency    Past Surgical History:  Procedure Laterality Date   APPENDECTOMY  2007   BREAST BIOPSY Left 09/27/2009   BREAST ENHANCEMENT SURGERY  2006   CARDIOVASCULAR STRESS TEST  2013   [place] [name] - WNL, EF 55-60%, with 0 calcium score   CHOLECYSTECTOMY  2007   biliary dyskinesia   COLONOSCOPY  08/2012   diverticulosis   COLONOSCOPY  2006   inflamed hyperplastic polyp   ESOPHAGOGASTRODUODENOSCOPY  08/2012   esophageal reflux   HERNIA REPAIR     NASAL SINUS SURGERY     RECTOCELE REPAIR  12/2008   [name]   sleep study  08/2007   OSA, 12 cm H2O,   TOTAL ABDOMINAL HYSTERECTOMY  1991   heavy bleeding, ovaries removed   TUBAL LIGATION     US ECHOCARDIOGRAPHY  09/2009   impaired relaxation, mild tric regurg, normal MV, EF 60%   US ECHOCARDIOGRAPHY  11/2007   mild-mod MR    Family History  Problem Relation Age of Onset   Cancer Mother 58       lung, passive smoke   Cancer Father 41       lung, smoker   Cancer Paternal Aunt        breast, lung   Cancer Cousin        breast   Stroke Sister        TIA   Hypertension Sister    Heart failure Sister    Aortic dissection Paternal Uncle    Heart attack Paternal Uncle    AAA (abdominal aortic aneurysm) Paternal Uncle  Aortic dissection Maternal Aunt    Breast cancer Maternal Aunt    Heart disease Paternal Uncle    Stroke Paternal Uncle    Social History   Socioeconomic History   Marital status: Married    Spouse name: Not on file   Number of children: Not on file   Years of education: Not on file   Highest education level: Not on file  Occupational History   Occupation: retired  Tobacco Use   Smoking status: Former Smoker    Packs/day: 0.30    Years: 7.00    Pack years: 2.10    Types: Cigarettes    Quit date: 10/27/1973    Years since quitting: 46.6   Smokeless tobacco: Never Used   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 Hx of children who are grown  Vaping Use   Vaping Use: Never used  Substance and Sexual Activity   Alcohol use: Not Currently    Alcohol/week: 0.0 standard drinks   Drug use: No   Sexual activity: Not on file  Other Topics Concern   Not on file  Social History Narrative   Lives with husband, cat   Grown children   Occ: retired, was Gaffer   Edu: trade degree then 2 yrscollege   Social Determinants of Radio broadcast assistant Strain:    Difficulty of Paying Living Expenses:   Food Insecurity: No Food Insecurity   Worried About Charity fundraiser in the Last Year: Never true   Arboriculturist in the Last Year: Never true  Transportation Needs: No Transportation Needs   Lack of Transportation (Medical): No   Lack of Transportation (Non-Medical): No  Physical Activity:    Days of Exercise per Week:    Minutes of  Exercise per Session:   Stress:    Feeling of Stress :   Social Connections: Unknown   Frequency of Communication with Friends and Family: More than three times a week   Frequency of Social Gatherings with Friends and Family: Not on file   Attends Religious Services: Not on file   Active Member of Clubs or Organizations: Not on file   Attends Archivist Meetings: Not on file   Marital Status: Married    Tobacco Counseling Counseling given: Not Answered Comment: 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 N  Vision? N  Difficulty concentrating or making decisions? N  Walking or climbing stairs? N  Dressing or bathing? N  Doing errands, shopping? N  Preparing Food and eating ? N  Using the Toilet? N  In the past six months, have you accidently leaked urine? N  Do you have problems with loss of bowel control? N  Managing your Medications? N  Managing your Finances? N  Housekeeping or managing your Housekeeping? N  Some recent data might be hidden    Patient Care Team: Crecencio Mc, MD as PCP - General (Internal Medicine)  Indicate any recent Medical Services you  may have received from other than [facility] providers in the past year (date may be approximate).     Assessment:   This is a routine wellness examination for Shantae.  I connected with Sema today by telephone and verified that I am speaking with the correct person using two identifiers. Location patient: home Location provider: work Persons participating in the virtual visit:  patient, Marine scientist.    I discussed the limitations, risks, security and privacy concerns of performing an evaluation and management service by telephone and the availability of in person appointments. I also discussed with the patient that there may be a patient responsible charge related to this service. The patient expressed understanding and verbally consented to this telephonic visit.    Interactive audio and video telecommunications were attempted between this provider and patient, however failed, due to patient having technical difficulties OR patient did not have access to video capability.  We continued and completed visit with audio only.  Some vital signs may be absent or patient reported.   Hearing/Vision screen  Hearing Screening   125Hz  250Hz  500Hz  1000Hz  2000Hz  3000Hz  4000Hz  6000Hz  8000Hz   Right ear:           Left ear:           Comments: Patient is able to hear conversational tones without difficulty.  No issues reported.  Vision Screening Comments: Wears corrective lenses Visual acuity not assessed, virtual visit.  They have seen their ophthalmologist in the last 12 months.     Dietary issues and exercise activities discussed:    Goals       Patient Stated     Id like to lose about 3-5lbs (pt-stated)      Increase physical activity Raise heart rate while walking in place  Cut back on carb intake      Other     Low carb diet      Portion control       Depression Screen PHQ 2/9 Scores 05/23/2020 11/07/2019 05/23/2019 05/20/2018 05/14/2017 06/23/2016  PHQ - 2 Score 0 0 0 0 0 0    Fall Risk Fall Risk  05/23/2020 05/15/2020 12/06/2019 11/08/2019 11/07/2019  Falls in the past year? 0 0 1 1 1   Comment - - - - -  Number falls in past yr: 0 - 0 0 0  Comment - - - - -  Injury with Fall? - - 1 1 1   Comment - - - - -  Risk for fall due to : - History of fall(s) - - History of fall(s)  Follow up Falls evaluation completed Falls evaluation completed Falls evaluation completed  Falls evaluation completed Falls evaluation completed    Handrails in use when climbing stairs? Yes  Home free of loose throw rugs in walkways, pet beds, electrical cords, etc? Yes  Adequate lighting in your home to reduce risk of falls? Yes   ASSISTIVE DEVICES UTILIZED TO PREVENT FALLS:  Life alert? No  Use of a cane, walker or w/c? No  Grab bars in the bathroom? No  Shower chair or bench in shower? No  Elevated toilet seat or a handicapped toilet? No   TIMED UP AND GO:  Was the test performed? No .   Cognitive Function: Patient is alert and oriented x3 She loves to read, craft, complete crossword puzzles and other brain challenging activities.  Denies difficulty with focusing, concentrating, memory loss.   MMSE - Mini Mental State Exam 05/23/2020 05/20/2018  Not completed: Unable to complete -  Orientation to time - 5  Orientation to Place - 5  Registration - 3  Attention/ Calculation - 5  Recall - 3  Language- name 2 objects - 2  Language- repeat - 1  Language- follow 3 step command - 3  Language- read & follow direction - 1  Write a sentence - 1  Copy design - 1  Total score - 30     6CIT Screen 05/23/2019  What Year? 0 points  What month? 0 points  What time? 0 points  Count back from 20 0 points  Months in reverse 0 points  Repeat phrase 0 points  Total Score 0    Immunizations Immunization History  Administered Date(s) Administered   Fluad Quad(high Dose 65+) 07/16/2019   Influenza Split 07/15/2013, 08/04/2017   Influenza Whole 07/27/2009, 07/28/2011, 07/21/2012   Influenza, High Dose Seasonal PF 07/22/2017, 07/01/2018   Influenza,inj,Quad PF,6+ Mos 08/03/2014, 07/12/2015, 07/14/2016   Influenza-Unspecified 07/15/2013, 08/03/2014, 07/12/2015, 07/14/2016, 08/04/2017   Moderna SARS-COVID-2 Vaccination 12/11/2019, 01/08/2020   Pneumococcal Conjugate-13 11/08/2014   Pneumococcal Polysaccharide-23 07/27/2009, 12/27/2015   Td 10/27/2008   Tdap 12/19/2013,  02/25/2017   Tetanus 10/27/2008   Zoster 10/27/2009   Zoster Recombinat (Shingrix) 04/27/2020   Health Maintenance There are no preventive care reminders to display for this patient. Health Maintenance  Topic Date Due   INFLUENZA VACCINE  05/27/2020   MAMMOGRAM  04/12/2022   COLONOSCOPY  09/02/2022   TETANUS/TDAP  02/26/2027   DEXA SCAN  Completed   COVID-19 Vaccine  Completed   Hepatitis C Screening  Completed   PNA vac Low Risk Adult  Completed   Dental Screening: Recommended annual dental exams for proper oral hygiene.  Community Resource Referral / Chronic Care Management: CRR required this visit?  No   CCM required this visit?  No      Plan:   Keep all routine maintenance appointments.   Follow up 05/28/20 @ 3:30  I have personally reviewed and noted the following in the patient's chart:   Medical and social history Use of alcohol, tobacco or illicit drugs  Current medications and supplements Functional ability and status Nutritional status Physical activity Advanced directives List of other physicians Hospitalizations, surgeries, and ER visits in previous 12 months Vitals Screenings to include cognitive, depression, and falls Referrals and appointments  In addition, I have reviewed and discussed with patient certain preventive protocols, quality metrics, and best practice recommendations. A written personalized care plan for preventive services as well as general preventive health recommendations were provided to patient via mychart.     OBrien-Blaney, Apryle Stowell L, LPN   1/76/1607     I have reviewed the above information and agree with above.   Deborra Medina, MD

## 2020-05-28 ENCOUNTER — Ambulatory Visit (INDEPENDENT_AMBULATORY_CARE_PROVIDER_SITE_OTHER): Payer: Medicare Other | Admitting: Internal Medicine

## 2020-05-28 ENCOUNTER — Other Ambulatory Visit: Payer: Self-pay

## 2020-05-28 ENCOUNTER — Encounter: Payer: Self-pay | Admitting: Internal Medicine

## 2020-05-28 ENCOUNTER — Ambulatory Visit: Payer: Medicare Other | Admitting: Internal Medicine

## 2020-05-28 VITALS — BP 116/78 | HR 78 | Temp 98.1°F | Resp 15 | Ht 65.5 in | Wt 182.8 lb

## 2020-05-28 DIAGNOSIS — Z8371 Family history of colonic polyps: Secondary | ICD-10-CM | POA: Diagnosis not present

## 2020-05-28 DIAGNOSIS — R42 Dizziness and giddiness: Secondary | ICD-10-CM | POA: Diagnosis not present

## 2020-05-28 DIAGNOSIS — K2100 Gastro-esophageal reflux disease with esophagitis, without bleeding: Secondary | ICD-10-CM

## 2020-05-28 DIAGNOSIS — I10 Essential (primary) hypertension: Secondary | ICD-10-CM | POA: Diagnosis not present

## 2020-05-28 DIAGNOSIS — E538 Deficiency of other specified B group vitamins: Secondary | ICD-10-CM

## 2020-05-28 DIAGNOSIS — Z9989 Dependence on other enabling machines and devices: Secondary | ICD-10-CM

## 2020-05-28 DIAGNOSIS — G4701 Insomnia due to medical condition: Secondary | ICD-10-CM

## 2020-05-28 DIAGNOSIS — E559 Vitamin D deficiency, unspecified: Secondary | ICD-10-CM

## 2020-05-28 DIAGNOSIS — G4733 Obstructive sleep apnea (adult) (pediatric): Secondary | ICD-10-CM

## 2020-05-28 DIAGNOSIS — J45909 Unspecified asthma, uncomplicated: Secondary | ICD-10-CM

## 2020-05-28 DIAGNOSIS — E782 Mixed hyperlipidemia: Secondary | ICD-10-CM

## 2020-05-28 DIAGNOSIS — E034 Atrophy of thyroid (acquired): Secondary | ICD-10-CM

## 2020-05-28 MED ORDER — ESTROGENS, CONJUGATED 0.625 MG/GM VA CREA: 1.0000 | TOPICAL_CREAM | VAGINAL | 3 refills | Status: DC

## 2020-05-28 MED ORDER — MONTELUKAST SODIUM 10 MG PO TABS: 10.0000 mg | ORAL_TABLET | Freq: Every day | ORAL | 3 refills | Status: DC

## 2020-05-28 MED ORDER — METOPROLOL SUCCINATE ER 50 MG PO TB24: 50.0000 mg | ORAL_TABLET | Freq: Every day | ORAL | 3 refills | Status: DC

## 2020-05-28 MED ORDER — EPINEPHRINE 0.3 MG/0.3ML IJ SOAJ: 0.3000 mg | INTRAMUSCULAR | 1 refills | Status: DC | PRN

## 2020-05-28 MED ORDER — ATORVASTATIN CALCIUM 40 MG PO TABS: 40.0000 mg | ORAL_TABLET | Freq: Every day | ORAL | 3 refills | Status: DC

## 2020-05-28 MED ORDER — FAMOTIDINE 20 MG PO TABS: 20.0000 mg | ORAL_TABLET | Freq: Every day | ORAL | 3 refills | Status: DC | PRN

## 2020-05-28 MED ORDER — UREA 10 % EX CREA: TOPICAL_CREAM | CUTANEOUS | 3 refills | Status: DC | PRN

## 2020-05-28 MED ORDER — LEVOTHYROXINE SODIUM 50 MCG PO TABS: 50.0000 ug | ORAL_TABLET | Freq: Every day | ORAL | 3 refills | Status: DC

## 2020-05-28 MED ORDER — ESTRADIOL 0.1 MG/24HR TD PTTW: 1.0000 | MEDICATED_PATCH | TRANSDERMAL | 3 refills | Status: DC

## 2020-05-28 MED ORDER — MECLIZINE HCL 25 MG PO TABS: 25.0000 mg | ORAL_TABLET | Freq: Three times a day (TID) | ORAL | 3 refills | Status: DC | PRN

## 2020-05-28 MED ORDER — AZELASTINE-FLUTICASONE 137-50 MCG/ACT NA SUSP: 1.0000 | Freq: Two times a day (BID) | NASAL | 3 refills | Status: DC

## 2020-05-28 MED ORDER — CYCLOSPORINE 0.05 % OP EMUL: 1.0000 [drp] | Freq: Two times a day (BID) | OPHTHALMIC | 3 refills | Status: DC

## 2020-05-28 MED ORDER — CYANOCOBALAMIN 1000 MCG/ML IJ SOLN: 1000.0000 ug | INTRAMUSCULAR | 3 refills | Status: DC

## 2020-05-28 MED ORDER — ONDANSETRON 4 MG PO TBDP: 4.0000 mg | ORAL_TABLET | Freq: Three times a day (TID) | ORAL | 2 refills | Status: DC | PRN

## 2020-05-28 MED ORDER — TRETINOIN 0.05 % EX CREA: TOPICAL_CREAM | Freq: Every day | CUTANEOUS | 1 refills | Status: DC

## 2020-05-28 MED ORDER — LOSARTAN POTASSIUM 50 MG PO TABS: 50.0000 mg | ORAL_TABLET | Freq: Every day | ORAL | 3 refills | Status: DC

## 2020-05-28 MED ORDER — ALPRAZOLAM 0.25 MG PO TABS: ORAL_TABLET | ORAL | 5 refills | Status: DC

## 2020-05-28 NOTE — Patient Instructions (Signed)
Order of shots:  Shingles #2  COVID booster   Influenza   One month in between   Health Maintenance After Age 69 After age 32, you are at a higher risk for certain long-term diseases and infections as well as injuries from falls. Falls are a major cause of broken bones and head injuries in people who are older than age 52. Getting regular preventive care can help to keep you healthy and well. Preventive care includes getting regular testing and making lifestyle changes as recommended by your health care provider. Talk with your health care provider about:  Which screenings and tests you should have. A screening is a test that checks for a disease when you have no symptoms.  A diet and exercise plan that is right for you. What should I know about screenings and tests to prevent falls? Screening and testing are the best ways to find a health problem early. Early diagnosis and treatment give you the best chance of managing medical conditions that are common after age 10. Certain conditions and lifestyle choices may make you more likely to have a fall. Your health care provider may recommend:  Regular vision checks. Poor vision and conditions such as cataracts can make you more likely to have a fall. If you wear glasses, make sure to get your prescription updated if your vision changes.  Medicine review. Work with your health care provider to regularly review all of the medicines you are taking, including over-the-counter medicines. Ask your health care provider about any side effects that may make you more likely to have a fall. Tell your health care provider if any medicines that you take make you feel dizzy or sleepy.  Osteoporosis screening. Osteoporosis is a condition that causes the bones to get weaker. This can make the bones weak and cause them to break more easily.  Blood pressure screening. Blood pressure changes and medicines to control blood pressure can make you feel  dizzy.  Strength and balance checks. Your health care provider may recommend certain tests to check your strength and balance while standing, walking, or changing positions.  Foot health exam. Foot pain and numbness, as well as not wearing proper footwear, can make you more likely to have a fall.  Depression screening. You may be more likely to have a fall if you have a fear of falling, feel emotionally low, or feel unable to do activities that you used to do.  Alcohol use screening. Using too much alcohol can affect your balance and may make you more likely to have a fall. What actions can I take to lower my risk of falls? General instructions  Talk with your health care provider about your risks for falling. Tell your health care provider if: ? You fall. Be sure to tell your health care provider about all falls, even ones that seem minor. ? You feel dizzy, sleepy, or off-balance.  Take over-the-counter and prescription medicines only as told by your health care provider. These include any supplements.  Eat a healthy diet and maintain a healthy weight. A healthy diet includes low-fat dairy products, low-fat (lean) meats, and fiber from whole grains, beans, and lots of fruits and vegetables. Home safety  Remove any tripping hazards, such as rugs, cords, and clutter.  Install safety equipment such as grab bars in bathrooms and safety rails on stairs.  Keep rooms and walkways well-lit. Activity   Follow a regular exercise program to stay fit. This will help you maintain your balance.  Ask your health care provider what types of exercise are appropriate for you.  If you need a cane or walker, use it as recommended by your health care provider.  Wear supportive shoes that have nonskid soles. Lifestyle  Do not drink alcohol if your health care provider tells you not to drink.  If you drink alcohol, limit how much you have: ? 0-1 drink a day for women. ? 0-2 drinks a day for  men.  Be aware of how much alcohol is in your drink. In the [place] one drink equals one typical bottle of beer (12 oz), one-half glass of wine (5 oz), or one shot of hard liquor (1 oz).  Do not use any products that contain nicotine or tobacco, such as cigarettes and e-cigarettes. If you need help quitting, ask your health care provider. Summary  Having a healthy lifestyle and getting preventive care can help to protect your health and wellness after age 74.  Screening and testing are the best way to find a health problem early and help you avoid having a fall. Early diagnosis and treatment give you the best chance for managing medical conditions that are more common for people who are older than age 62.  Falls are a major cause of broken bones and head injuries in people who are older than age 50. Take precautions to prevent a fall at home.  Work with your health care provider to learn what changes you can make to improve your health and wellness and to prevent falls. This information is not intended to replace advice given to you by your health care provider. Make sure you discuss any questions you have with your health care provider. Document Revised: 02/03/2019 Document Reviewed: 08/26/2017 Elsevier Patient Education  2020 [employer].

## 2020-05-28 NOTE — Progress Notes (Signed)
Subjective:  Patient ID: Angela Mendoza, female    DOB: 1951-08-01  Age: 69 y.o. MRN: [id number]  CC: The primary encounter diagnosis was Hypothyroidism due to acquired atrophy of thyroid. Diagnoses of Gastroesophageal reflux disease with esophagitis, Essential hypertension, benign, Mixed hyperlipidemia, Vitamin D deficiency, B12 deficiency, Asthma, intrinsic, Family history of colonic polyps, OSA on CPAP, Vertigo, and Insomnia due to medical condition were also pertinent to this visit.  HPI Angela Mendoza presents for follow up on GAD and other issues   This visit occurred during the SARS-CoV-2 public health emergency.  Safety protocols were in place, including screening questions prior to the visit, additional usage of staff PPE, and extensive cleaning of exam room while observing appropriate contact time as indicated for disinfecting solutions.   Patient has received both doses of the available COVID 19 vaccine without complications.  Patient continues to double mask when outside of the home except when walking in yard or at safe distances from others .  Patient reports an increase in anxiety resulting from concern for her aging mother in law and several of her younger family  members refusing to be vaccinated . She and husband Angela Mendoza both have lung disease  and have been pressured by family who reject the vaccine.    Health Maintenance:  Last colonoscopy 2015 normal.  FH of sister with multiple polyps  That were Precancerous .  She is not sure when appropriate follow up is (7 or 10 years)  Mammogram was normal in June Last DEXA : normal 2017 (Epic portal)    OSA:  Diagnosed by sleep study. She is wearing her CPAP every night a minimum of 6 hours per night and notes improved daytime wakefulness and decreased fatigue . Cleaning it nightly.     Outpatient Medications Prior to Visit  Medication Sig Dispense Refill  . albuterol (PROVENTIL HFA) 108 (90 Base) MCG/ACT inhaler Inhale 2 puffs  into the lungs every 6 (six) hours as needed. 18 g 2  . Ascorbic Acid (VITAMIN C WITH ROSE HIPS) 1000 MG tablet Take 1,000 mg by mouth daily.    [name] aspirin 81 MG tablet Take 81 mg by mouth once a week.     . B Complex Vitamins (VITAMIN-B COMPLEX) TABS Take 1 tablet by mouth daily.    . beclomethasone (QVAR REDIHALER) 80 MCG/ACT inhaler Inhale 2 puffs into the lungs 2 (two) times daily. 31.8 g 3  . Cholecalciferol (VITAMIN D3) 2000 units TABS Take 1 tablet by mouth daily. 90 tablet 3  . Coenzyme Q10 300 MG CAPS Take 1 capsule by mouth daily.    . diazepam (VALIUM) 5 MG tablet TAKE 1 TABLET BY MOUTH EVERY 6 HOURS AS NEEDED FOR VERTIGO 30 tablet 2  . Docusate Calcium (STOOL SOFTENER PO) Take 1 tablet by mouth as needed.    . fexofenadine (ALLEGRA ALLERGY) 180 MG tablet Take 1 tablet (180 mg total) by mouth daily. 90 tablet 3  . MAGNESIUM PO Take 250 mg by mouth daily.    . Multiple Vitamin (MULTIVITAMIN) tablet Take 1 tablet by mouth daily.    [name] omega-3 acid ethyl esters (LOVAZA) 1 g capsule Take 1 capsule (1 g total) by mouth daily. 90 capsule 3  . Propylene Glycol (SYSTANE BALANCE) 0.6 % SOLN Apply 1 drop to eye as needed.    . psyllium (METAMUCIL) 58.6 % powder Take 1 packet by mouth as needed.    [name] Spacer/Aero-Holding Chambers (AEROCHAMBER MV) inhaler Use as instructed 1  each 3  . Syringe/Needle, Disp, (SYRINGE 3CC/20GX1") 20G X 1" 3 ML MISC 1 Syringe by Does not apply route every 30 (thirty) days. For use with b-12 injection monthly 50 each 0  . ALPRAZolam (XANAX) 0.25 MG tablet TAKE 1 TABLET (0.25 MG TOTAL) BY MOUTH ONCE DAILY AS NEEDED FOR SLEEP. 30 tablet 2  . atorvastatin (LIPITOR) 40 MG tablet Take 1 tablet (40 mg total) by mouth daily. 90 tablet 3  . Azelastine-Fluticasone (DYMISTA) 137-50 MCG/ACT SUSP Place 1 spray into the nose 2 (two) times daily. 23 g 3  . conjugated estrogens (PREMARIN) vaginal cream Place 1 Applicatorful vaginally 2 (two) times a week. 90 day supply,  Refill for one  year 90 g 3  . cyanocobalamin (,VITAMIN B-12,) 1000 MCG/ML injection Inject 1 mL (1,000 mcg total) into the muscle every 30 (thirty) days. 3 mL 3  . cycloSPORINE (RESTASIS) 0.05 % ophthalmic emulsion Place 1 drop into both eyes 2 (two) times daily. 3 each 3  . EPINEPHrine 0.3 mg/0.3 mL IJ SOAJ injection Inject 0.3 mg into the muscle as needed for anaphylaxis.    [name] estradiol (VIVELLE-DOT) 0.1 MG/24HR patch Place 1 patch (0.1 mg total) onto the skin 2 (two) times a week. 24 patch 3  . famotidine (PEPCID) 20 MG tablet Take 1 tablet (20 mg total) by mouth daily as needed for heartburn or indigestion. 90 tablet 3  . levothyroxine (SYNTHROID) 50 MCG tablet Take 1 tablet (50 mcg total) by mouth daily before breakfast. 90 tablet 3  . losartan (COZAAR) 50 MG tablet Take 1 tablet (50 mg total) by mouth at bedtime. 90 tablet 3  . meclizine (ANTIVERT) 25 MG tablet Take 1 tablet (25 mg total) by mouth 3 (three) times daily as needed. 270 tablet 3  . metoprolol succinate (TOPROL-XL) 50 MG 24 hr tablet Take 1 tablet (50 mg total) by mouth daily. 90 tablet 2  . montelukast (SINGULAIR) 10 MG tablet Take 1 tablet (10 mg total) by mouth at bedtime. 90 tablet 3  . ondansetron (ZOFRAN ODT) 4 MG disintegrating tablet Take 1 tablet (4 mg total) by mouth every 8 (eight) hours as needed for nausea or vomiting. 30 tablet 2  . tretinoin (RETIN-A) 0.05 % cream APPLY TOPICALLY AT BEDTIME 45 g 1  . urea (CARMOL) 10 % cream APPLY TOPICALLY AS NEEDED 71 g 3   No facility-administered medications prior to visit.    Review of Systems;  Patient denies headache, fevers, malaise, unintentional weight loss, skin rash, eye pain, sinus congestion and sinus pain, sore throat, dysphagia,  hemoptysis , cough, dyspnea, wheezing, chest pain, palpitations, orthopnea, edema, abdominal pain, nausea, melena, diarrhea, constipation, flank pain, dysuria, hematuria, urinary  Frequency, nocturia, numbness, tingling, seizures,  Focal weakness, Loss  of consciousness,  Tremor, insomnia, depression, anxiety, and suicidal ideation.      Objective:  BP 116/78 (BP Location: Left Arm, Patient Position: Sitting, Cuff Size: Large)   Pulse 78   Temp 98.1 F (36.7 C) (Oral)   Resp 15   Ht 5' 5.5" (1.664 m)   Wt 182 lb 12.8 oz (82.9 kg)   SpO2 98%   BMI 29.96 kg/m   BP Readings from Last 3 Encounters:  05/28/20 116/78  05/15/20 (!) 144/80  05/08/20 126/70    Wt Readings from Last 3 Encounters:  05/28/20 182 lb 12.8 oz (82.9 kg)  05/23/20 180 lb (81.6 kg)  05/15/20 180 lb (81.6 kg)    General appearance: alert, cooperative and appears stated  age Ears: normal TM's and external ear canals both ears Throat: lips, mucosa, and tongue normal; teeth and gums normal Neck: no adenopathy, no carotid bruit, supple, symmetrical, trachea midline and thyroid not enlarged, symmetric, no tenderness/mass/nodules Back: symmetric, no curvature. ROM normal. No CVA tenderness. Lungs: clear to auscultation bilaterally Heart: regular rate and rhythm, S1, S2 normal, no murmur, click, rub or gallop Abdomen: soft, non-tender; bowel sounds normal; no masses,  no organomegaly Pulses: 2+ and symmetric Skin: Skin color, texture, turgor normal. No rashes or lesions Lymph nodes: Cervical, supraclavicular, and axillary nodes normal.  Lab Results  Component Value Date   HGBA1C 5.9 10/12/2015    Lab Results  Component Value Date   CREATININE 0.82 05/28/2020   CREATININE 0.81 12/08/2019   CREATININE 0.84 06/22/2019    Lab Results  Component Value Date   WBC 9.0 04/28/2018   HGB 13.5 04/28/2018   HCT 39.5 04/28/2018   PLT 269 04/28/2018   GLUCOSE 87 05/28/2020   CHOL 182 05/28/2020   TRIG 220.0 (H) 05/28/2020   HDL 62.50 05/28/2020   LDLDIRECT 83.0 05/28/2020   LDLCALC 81 12/08/2019   ALT 22 05/28/2020   AST 20 05/28/2020   NA 132 (L) 05/28/2020   K 4.4 05/28/2020   CL 97 05/28/2020   CREATININE 0.82 05/28/2020   BUN 9 05/28/2020   CO2 30  05/28/2020   TSH 1.35 05/28/2020   HGBA1C 5.9 10/12/2015    MM 3D SCREEN BREAST BILATERAL  Result Date: 04/16/2020 CLINICAL DATA:  Screening. EXAM: DIGITAL SCREENING BILATERAL MAMMOGRAM WITH TOMO AND CAD COMPARISON:  Previous exam(s). ACR Breast Density Category b: There are scattered areas of fibroglandular density. FINDINGS: There are no findings suspicious for malignancy. Images were processed with CAD. IMPRESSION: No mammographic evidence of malignancy. A result letter of this screening mammogram will be mailed directly to the patient. RECOMMENDATION: Screening mammogram in one year. (Code:SM-B-01Y) BI-RADS CATEGORY  1: Negative. Electronically Signed   By: Kristopher Oppenheim M.D.   On: 04/16/2020 14:41    Assessment & Plan:   Problem List Items Addressed This Visit      Unprioritized   Asthma, intrinsic    Managed with Pulmonology .  No recent exacerbations.  Continue aggressive management of GERD and allergic rhinitis and continue masking when out,   "shelter at home" precautions during COVID 19 epidemic       Relevant Medications   montelukast (SINGULAIR) 10 MG tablet   B12 deficiency   Relevant Orders   Vitamin B12 (Completed)   Essential hypertension, benign    Well controlled on current regimen of losartan and metoprolol. Renal function stable, no changes today.  Lab Results  Component Value Date   CREATININE 0.82 05/28/2020   Lab Results  Component Value Date   NA 132 (L) 05/28/2020   K 4.4 05/28/2020   CL 97 05/28/2020   CO2 30 05/28/2020         Relevant Medications   atorvastatin (LIPITOR) 40 MG tablet   EPINEPHrine 0.3 mg/0.3 mL IJ SOAJ injection   losartan (COZAAR) 50 MG tablet   metoprolol succinate (TOPROL-XL) 50 MG 24 hr tablet   Other Relevant Orders   TSH (Completed)   Family history of colonic polyps    Referral in progress  To Laurier Nancy ,  Follow up is unclear due to family history of multiple polyps in sister.       Relevant Orders    Ambulatory referral to Gastroenterology   Hypothyroidism -  Primary    Thyroid function is WNL on current dose.  No current changes needed.   Lab Results  Component Value Date   TSH 1.35 05/28/2020         Relevant Medications   levothyroxine (SYNTHROID) 50 MCG tablet   metoprolol succinate (TOPROL-XL) 50 MG 24 hr tablet   Other Relevant Orders   Comprehensive metabolic panel (Completed)   Insomnia due to medical condition    She continues to require alprazolam prn insomnia and mask adjustment.  Refilled today. The risks and benefits of benzodiazepine use were discussed with patient today including excessive sedation leading to respiratory depression,  impaired thinking/driving, and addiction.  Patient was advised to avoid concurrent use with alcohol, to use medication only as needed and not to share with others  .       Mixed hyperlipidemia    With cerebral microvascular disease by MRI . Continue  aspirin once weekly due to excessive bruising, and daily Lipitor. She has had no events.    Lab Results  Component Value Date   CHOL 182 05/28/2020   HDL 62.50 05/28/2020   LDLCALC 81 12/08/2019   LDLDIRECT 83.0 05/28/2020   TRIG 220.0 (H) 05/28/2020   CHOLHDL 3 05/28/2020         Relevant Medications   atorvastatin (LIPITOR) 40 MG tablet   EPINEPHrine 0.3 mg/0.3 mL IJ SOAJ injection   losartan (COZAAR) 50 MG tablet   metoprolol succinate (TOPROL-XL) 50 MG 24 hr tablet   Other Relevant Orders   Lipid panel (Completed)   OSA on CPAP    Diagnosed by sleep study. She is wearing her CPAP every night a minimum of 6 hours per night and notes improved daytime wakefulness and decreased fatigue       Vertigo    Infrequently occurring, managed with valium.        Other Visit Diagnoses    Gastroesophageal reflux disease with esophagitis       Relevant Medications   famotidine (PEPCID) 20 MG tablet   Vitamin D deficiency       Relevant Orders   VITAMIN D 25 Hydroxy (Vit-D  Deficiency, Fractures) (Completed)      I have changed Joaquim Lai A. Ayuso's EPINEPHrine. I am also having her maintain her aspirin, Propylene Glycol, psyllium, Docusate Calcium (STOOL SOFTENER PO), fexofenadine, Vitamin-B Complex, AeroChamber MV, Vitamin D3, multivitamin, vitamin C with rose hips, MAGNESIUM PO, Coenzyme Q10, diazepam, SYRINGE 3CC/20GX1", omega-3 acid ethyl esters, albuterol, Qvar RediHaler, atorvastatin, Azelastine-Fluticasone, cyanocobalamin, cycloSPORINE, estradiol, conjugated estrogens, famotidine, levothyroxine, losartan, meclizine, metoprolol succinate, montelukast, ondansetron, tretinoin, urea, and ALPRAZolam.  Meds ordered this encounter  Medications  . atorvastatin (LIPITOR) 40 MG tablet    Sig: Take 1 tablet (40 mg total) by mouth daily.    Dispense:  90 tablet    Refill:  3  . Azelastine-Fluticasone (DYMISTA) 137-50 MCG/ACT SUSP    Sig: Place 1 spray into the nose 2 (two) times daily.    Dispense:  23 g    Refill:  3  . cyanocobalamin (,VITAMIN B-12,) 1000 MCG/ML injection    Sig: Inject 1 mL (1,000 mcg total) into the muscle every 30 (thirty) days.    Dispense:  3 mL    Refill:  3  . cycloSPORINE (RESTASIS) 0.05 % ophthalmic emulsion    Sig: Place 1 drop into both eyes 2 (two) times daily.    Dispense:  3 each    Refill:  3  . EPINEPHrine 0.3 mg/0.3 mL IJ  SOAJ injection    Sig: Inject 0.3 mLs (0.3 mg total) into the muscle as needed for anaphylaxis.    Dispense:  1 each    Refill:  1  . estradiol (VIVELLE-DOT) 0.1 MG/24HR patch    Sig: Place 1 patch (0.1 mg total) onto the skin 2 (two) times a week.    Dispense:  24 patch    Refill:  3    Generic ok  . conjugated estrogens (PREMARIN) vaginal cream    Sig: Place 1 Applicatorful vaginally 2 (two) times a week. 90 day supply,  Refill for one year    Dispense:  90 g    Refill:  3  . famotidine (PEPCID) 20 MG tablet    Sig: Take 1 tablet (20 mg total) by mouth daily as needed for heartburn or indigestion.     Dispense:  90 tablet    Refill:  3  . levothyroxine (SYNTHROID) 50 MCG tablet    Sig: Take 1 tablet (50 mcg total) by mouth daily before breakfast.    Dispense:  90 tablet    Refill:  3    NAME BRAND ONLY SYNTHROID  . losartan (COZAAR) 50 MG tablet    Sig: Take 1 tablet (50 mg total) by mouth at bedtime.    Dispense:  90 tablet    Refill:  3  . meclizine (ANTIVERT) 25 MG tablet    Sig: Take 1 tablet (25 mg total) by mouth 3 (three) times daily as needed.    Dispense:  270 tablet    Refill:  3  . metoprolol succinate (TOPROL-XL) 50 MG 24 hr tablet    Sig: Take 1 tablet (50 mg total) by mouth daily.    Dispense:  90 tablet    Refill:  3  . montelukast (SINGULAIR) 10 MG tablet    Sig: Take 1 tablet (10 mg total) by mouth at bedtime.    Dispense:  90 tablet    Refill:  3  . ondansetron (ZOFRAN ODT) 4 MG disintegrating tablet    Sig: Take 1 tablet (4 mg total) by mouth every 8 (eight) hours as needed for nausea or vomiting.    Dispense:  30 tablet    Refill:  2  . tretinoin (RETIN-A) 0.05 % cream    Sig: Apply topically at bedtime.    Dispense:  45 g    Refill:  1  . urea (CARMOL) 10 % cream    Sig: Apply topically as needed.    Dispense:  71 g    Refill:  3  . ALPRAZolam (XANAX) 0.25 MG tablet    Sig: TAKE 1 TABLET (0.25 MG TOTAL) BY MOUTH ONCE DAILY AS NEEDED FOR SLEEP.    Dispense:  30 tablet    Refill:  5    I provided  30 minutes of  face-to-face time during this encounter reviewing patient's current problems and past surgeries, labs and imaging studies, providing counseling on the above mentioned problems , and coordination  of care .  Medications Discontinued During This Encounter  Medication Reason  . EPINEPHrine 0.3 mg/0.3 mL IJ SOAJ injection Reorder  . atorvastatin (LIPITOR) 40 MG tablet Reorder  . meclizine (ANTIVERT) 25 MG tablet Reorder  . cyanocobalamin (,VITAMIN B-12,) 1000 MCG/ML injection Reorder  . ondansetron (ZOFRAN ODT) 4 MG disintegrating tablet  Reorder  . estradiol (VIVELLE-DOT) 0.1 MG/24HR patch Reorder  . Azelastine-Fluticasone (DYMISTA) 137-50 MCG/ACT SUSP Reorder  . metoprolol succinate (TOPROL-XL) 50 MG 24 hr tablet Reorder  .  levothyroxine (SYNTHROID) 50 MCG tablet Reorder  . cycloSPORINE (RESTASIS) 0.05 % ophthalmic emulsion Reorder  . conjugated estrogens (PREMARIN) vaginal cream Reorder  . losartan (COZAAR) 50 MG tablet Reorder  . famotidine (PEPCID) 20 MG tablet Reorder  . montelukast (SINGULAIR) 10 MG tablet Reorder  . tretinoin (RETIN-A) 0.05 % cream Reorder  . urea (CARMOL) 10 % cream Reorder  . ALPRAZolam (XANAX) 0.25 MG tablet Reorder    Follow-up: No follow-ups on file.   Crecencio Mc, MD

## 2020-05-29 DIAGNOSIS — F419 Anxiety disorder, unspecified: Secondary | ICD-10-CM | POA: Insufficient documentation

## 2020-05-29 DIAGNOSIS — G4701 Insomnia due to medical condition: Secondary | ICD-10-CM | POA: Insufficient documentation

## 2020-05-29 LAB — COMPREHENSIVE METABOLIC PANEL
ALT: 22 U/L (ref 0–35)
AST: 20 U/L (ref 0–37)
Albumin: 4.5 g/dL (ref 3.5–5.2)
Alkaline Phosphatase: 62 U/L (ref 39–117)
BUN: 9 mg/dL (ref 6–23)
CO2: 30 mEq/L (ref 19–32)
Calcium: 9.5 mg/dL (ref 8.4–10.5)
Chloride: 97 mEq/L (ref 96–112)
Creatinine, Ser: 0.82 mg/dL (ref 0.40–1.20)
GFR: 69.01 mL/min (ref >60.00)
Glucose, Bld: 87 mg/dL (ref 70–99)
Potassium: 4.4 mEq/L (ref 3.5–5.1)
Sodium: 132 mEq/L — ABNORMAL LOW (ref 135–145)
Total Bilirubin: 1 mg/dL (ref 0.2–1.2)
Total Protein: 7.5 g/dL (ref 6.0–8.3)

## 2020-05-29 LAB — VITAMIN D 25 HYDROXY (VIT D DEFICIENCY, FRACTURES): VITD: 31.25 ng/mL (ref 30.00–100.00)

## 2020-05-29 LAB — LIPID PANEL
Cholesterol: 182 mg/dL (ref 0–200)
HDL: 62.5 mg/dL (ref >39.00)
NonHDL: 119.23
Total CHOL/HDL Ratio: 3
Triglycerides: 220 mg/dL — ABNORMAL HIGH (ref 0.0–149.0)
VLDL: 44 mg/dL — ABNORMAL HIGH (ref 0.0–40.0)

## 2020-05-29 LAB — VITAMIN B12: Vitamin B-12: 439 pg/mL (ref 211–911)

## 2020-05-29 LAB — LDL CHOLESTEROL, DIRECT: Direct LDL: 83 mg/dL

## 2020-05-29 LAB — TSH: TSH: 1.35 u[IU]/mL (ref 0.35–4.50)

## 2020-05-29 NOTE — Assessment & Plan Note (Signed)
Well controlled on current regimen of losartan and metoprolol. Renal function stable, no changes today.  Lab Results  Component Value Date   CREATININE 0.82 05/28/2020   Lab Results  Component Value Date   NA 132 (L) 05/28/2020   K 4.4 05/28/2020   CL 97 05/28/2020   CO2 30 05/28/2020

## 2020-05-29 NOTE — Assessment & Plan Note (Signed)
Thyroid function is WNL on current dose.  No current changes needed.   Lab Results  Component Value Date   TSH 1.35 05/28/2020

## 2020-05-29 NOTE — Assessment & Plan Note (Signed)
With cerebral microvascular disease by MRI . Continue  aspirin once weekly due to excessive bruising, and daily Lipitor. She has had no events.    Lab Results  Component Value Date   CHOL 182 05/28/2020   HDL 62.50 05/28/2020   LDLCALC 81 12/08/2019   LDLDIRECT 83.0 05/28/2020   TRIG 220.0 (H) 05/28/2020   CHOLHDL 3 05/28/2020

## 2020-05-29 NOTE — Assessment & Plan Note (Signed)
Diagnosed by sleep study. She is wearing her CPAP every night a minimum of 6 hours per night and notes improved daytime wakefulness and decreased fatigue  

## 2020-05-29 NOTE — Assessment & Plan Note (Signed)
She continues to require alprazolam prn insomnia and mask adjustment.  Refilled today. The risks and benefits of benzodiazepine use were discussed with patient today including excessive sedation leading to respiratory depression,  impaired thinking/driving, and addiction.  Patient was advised to avoid concurrent use with alcohol, to use medication only as needed and not to share with others  .

## 2020-05-29 NOTE — Assessment & Plan Note (Signed)
Managed with Pulmonology .  No recent exacerbations.  Continue aggressive management of GERD and allergic rhinitis and continue masking when out,   "shelter at home" precautions during COVID 19 epidemic

## 2020-05-29 NOTE — Assessment & Plan Note (Signed)
Referral in progress  To Angela Mendoza ,  Follow up is unclear due to family history of multiple polyps in sister.

## 2020-05-29 NOTE — Assessment & Plan Note (Signed)
Infrequently occurring, managed with valium.

## 2020-06-19 DIAGNOSIS — N6012 Diffuse cystic mastopathy of left breast: Secondary | ICD-10-CM | POA: Diagnosis not present

## 2020-06-19 DIAGNOSIS — N6011 Diffuse cystic mastopathy of right breast: Secondary | ICD-10-CM | POA: Diagnosis not present

## 2020-07-03 ENCOUNTER — Telehealth: Payer: Self-pay | Admitting: Internal Medicine

## 2020-07-03 NOTE — Telephone Encounter (Signed)
Patient called to schedule flu shot scheduled on 07-06-20 she wanted to know if it was ok with Dr.Tullo that she get her flu shot now

## 2020-07-03 NOTE — Telephone Encounter (Signed)
Spoke with pt to let her know that it is okay for her to go ahead and get the flu shot.

## 2020-07-04 NOTE — Telephone Encounter (Signed)
Dr. Halford Chessman please advise on patient mychart message  Hello Dr Halford Chessman.  At [facility] appt with you, I was prescribed several days course of pills for thrush in my throat. I took them and it worked!  I look in a magnifying mirror every day in my mouth/throat.  I have been fine. But about 3 days ago I noticed in the far back of my throat some white spots appearing again.  Do you think I need another course of the pills to finally stop this?  Thank you.

## 2020-07-05 MED ORDER — FLUCONAZOLE 100 MG PO TABS
ORAL_TABLET | ORAL | 0 refills | Status: DC
Start: 2020-07-05 — End: 2020-10-09

## 2020-07-05 NOTE — Telephone Encounter (Signed)
Contacted patient and advised her that Dr. Halford Chessman has sent in a prescription for her thrush symptoms. She has a spacer at home.

## 2020-07-05 NOTE — Telephone Encounter (Signed)
Please send script for fluconazole 100 mg pill.  She should take 2 pills on the first day, then 1 pill daily for the next 6 days.  Please make sure she has spacer device to use with her inhalers.

## 2020-07-06 ENCOUNTER — Ambulatory Visit: Payer: Medicare Other

## 2020-07-16 ENCOUNTER — Ambulatory Visit: Payer: Medicare Other

## 2020-07-20 ENCOUNTER — Ambulatory Visit (INDEPENDENT_AMBULATORY_CARE_PROVIDER_SITE_OTHER): Payer: Medicare Other

## 2020-07-20 ENCOUNTER — Other Ambulatory Visit: Payer: Self-pay

## 2020-07-20 DIAGNOSIS — Z23 Encounter for immunization: Secondary | ICD-10-CM | POA: Diagnosis not present

## 2020-07-31 ENCOUNTER — Telehealth: Payer: Self-pay | Admitting: Internal Medicine

## 2020-07-31 MED ORDER — AZELASTINE-FLUTICASONE 137-50 MCG/ACT NA SUSP: 1.0000 | Freq: Two times a day (BID) | NASAL | 3 refills | Status: DC

## 2020-07-31 NOTE — Telephone Encounter (Signed)
Patient needs a refill on her Azelastine-Fluticasone (DYMISTA) 137-50 MCG/ACT SUSP, this is needs to be sent to her [facility], [facility]. Per pharmacy, prescription needs to be corrected and reorder stating ; 90 day supply,( instead of 1 months supply) refillable up to 3 times a year.

## 2020-08-17 DIAGNOSIS — Z23 Encounter for immunization: Secondary | ICD-10-CM | POA: Diagnosis not present

## 2020-10-09 ENCOUNTER — Telehealth: Payer: Self-pay | Admitting: Pulmonary Disease

## 2020-10-09 MED ORDER — FLUCONAZOLE 100 MG PO TABS: 100.0000 mg | ORAL_TABLET | Freq: Every day | ORAL | 0 refills | Status: AC

## 2020-10-09 NOTE — Telephone Encounter (Signed)
Send script for fluconazole 100 mg daily for 5 days.  Should be okay for her son to visit if he is fully vaccinated also.

## 2020-10-09 NOTE — Telephone Encounter (Signed)
  Chesley Mires, MD    10/09/20 10:30 AM Note Send script for fluconazole 100 mg daily for 5 days.     Called pt's pharmacy and clarified the instructions for pt's fluconazole. Nothing further needed.

## 2020-10-09 NOTE — Telephone Encounter (Signed)
ATC pt. I am unable to reach pt as pt has restrictions against blocked numbers. Will keep in triage for office to call pt.

## 2020-10-09 NOTE — Telephone Encounter (Signed)
Patient states that she is having some thrush from her daily meds. Mouth wash does not work, she usually takes fluconazole 100 mg pill. Symptoms started last Thursday and is now in throat. States that as discussed with Dr. Halford Chessman at last visit she feels it is from her nasal spray but feels that she needs to use it due to her allergies being so bad. But wants Dr. Collie Siad recommendations. She also wants to know if her son should/can come over for Christmas Eve with her asthma. She is fully vaccinated with 3 Moderna vaccines. Would like Dr. Collie Siad recommendations on that as well  Dr. Halford Chessman please advise

## 2020-10-09 NOTE — Telephone Encounter (Signed)
Called and spoke with patient to let her know RX was being sent into pharmacy. And recs from Dr. Halford Chessman. Patient expressed understanding. Patient has been scheduled for OV in Jan. Nothing further needed at this time.

## 2020-10-16 ENCOUNTER — Other Ambulatory Visit: Payer: Self-pay

## 2020-10-16 ENCOUNTER — Encounter: Payer: Self-pay | Admitting: Internal Medicine

## 2020-10-16 ENCOUNTER — Telehealth (INDEPENDENT_AMBULATORY_CARE_PROVIDER_SITE_OTHER): Payer: Medicare Other | Admitting: Internal Medicine

## 2020-10-16 VITALS — BP 122/86 | HR 68 | Ht 65.5 in | Wt 175.0 lb

## 2020-10-16 DIAGNOSIS — J324 Chronic pansinusitis: Secondary | ICD-10-CM | POA: Diagnosis not present

## 2020-10-16 MED ORDER — AZITHROMYCIN 250 MG PO TABS: ORAL_TABLET | ORAL | 0 refills | Status: DC

## 2020-10-16 MED ORDER — PREDNISONE 10 MG PO TABS: ORAL_TABLET | ORAL | 0 refills | Status: DC

## 2020-10-16 NOTE — Progress Notes (Signed)
Virtual Visit via Video Note  I connected with Angela Mendoza  on 10/16/20 at  1:15 PM EST by a video enabled telemedicine application and verified that I am speaking with the correct person using two identifiers.  Location patient: home, [place] Location provider:work or home office Persons participating in the virtual visit: patient, provider  I discussed the limitations of evaluation and management by telemedicine and the availability of in person appointments. The patient expressed understanding and agreed to proceed.   HPI: 1. Chronic sinusitis frontal and maxillary happens 1-2 x per year in fall or winter no covid exposure has h/a sinus pressure cheeks and frontal tried Tylenol and sinus rinse and feels congested in her cheeks ~2 years ago saw allergist who rec 2x per week allergy shots but did not get yet due to covid and if had reaction will need to go to hospital. Having white/green thick discharge from nose and ears muffled  She also has h/o asthma stable for now  ROS: See pertinent positives and negatives per HPI.  Past Medical History:  Diagnosis Date  . ABNORMAL HEART RHYTHMS 06/09/2008  . Allergic rhinitis 06/09/2008   chronic  . Asthma, intrinsic 10/06/2011   controlled on qvar. [name] 09/2011:  Very mild airflow obstruction (FEV1% 68, FVL c/w obstruction)   . Chronic headache 06/09/2008  . COPD (chronic obstructive pulmonary disease) ([place])   . Depression   . Diverticulosis 2013   h/o diverticulitis  . Dry eyes   . GERD (gastroesophageal reflux disease)   . History of breast implant removal    silicone mastitis - R axilla silicone LN, free silicone L breast upper outer quadrant  . Hot flashes   . HYPERLIPIDEMIA 06/09/2008  . HYPERTENSION 06/09/2008  . Hypothyroidism   . MVP (mitral valve prolapse)   . OSA on CPAP    [name]  . Pernicious anemia    per prior pcp  . Rosacea   . Surgical menopause 1991  . Vertigo   . Vitamin D deficiency     Past Surgical History:   Procedure Laterality Date  . APPENDECTOMY  2007  . BREAST BIOPSY Left 09/27/2009  . BREAST ENHANCEMENT SURGERY  2006  . CARDIOVASCULAR STRESS TEST  2013   [place] [name] - WNL, EF 55-60%, with 0 calcium score  . CHOLECYSTECTOMY  2007   biliary dyskinesia  . COLONOSCOPY  08/2012   diverticulosis  . COLONOSCOPY  2006   inflamed hyperplastic polyp  . ESOPHAGOGASTRODUODENOSCOPY  08/2012   esophageal reflux  . HERNIA REPAIR    . NASAL SINUS SURGERY    . RECTOCELE REPAIR  12/2008   [name]  . sleep study  08/2007   OSA, 12 cm H2O,  . TOTAL ABDOMINAL HYSTERECTOMY  1991   heavy bleeding, ovaries removed  . TUBAL LIGATION    . US ECHOCARDIOGRAPHY  09/2009   impaired relaxation, mild tric regurg, normal MV, EF 60%  . US ECHOCARDIOGRAPHY  11/2007   mild-mod MR     Current Outpatient Medications:  .  atorvastatin (LIPITOR) 40 MG tablet, Take 1 tablet (40 mg total) by mouth daily., Disp: 90 tablet, Rfl: 3 .  Azelastine-Fluticasone (DYMISTA) 137-50 MCG/ACT SUSP, Place 1 spray into the nose 2 (two) times daily., Disp: 23 g, Rfl: 3 .  beclomethasone (QVAR REDIHALER) 80 MCG/ACT inhaler, Inhale 2 puffs into the lungs 2 (two) times daily., Disp: 31.8 g, Rfl: 3 .  Cholecalciferol (VITAMIN D3) 2000 units TABS, Take 1 tablet by mouth daily.,  Disp: 90 tablet, Rfl: 3 .  Coenzyme Q10 300 MG CAPS, Take 1 capsule by mouth daily., Disp: , Rfl:  .  conjugated estrogens (PREMARIN) vaginal cream, Place 1 Applicatorful vaginally 2 (two) times a week. 90 day supply,  Refill for one year, Disp: 90 g, Rfl: 3 .  cyanocobalamin (,VITAMIN B-12,) 1000 MCG/ML injection, Inject 1 mL (1,000 mcg total) into the muscle every 30 (thirty) days., Disp: 3 mL, Rfl: 3 .  cycloSPORINE (RESTASIS) 0.05 % ophthalmic emulsion, Place 1 drop into both eyes 2 (two) times daily., Disp: 3 each, Rfl: 3 .  diazepam (VALIUM) 5 MG tablet, TAKE 1 TABLET BY MOUTH EVERY 6 HOURS AS NEEDED FOR VERTIGO, Disp: 30 tablet, Rfl: 2 .   Docusate Calcium (STOOL SOFTENER PO), Take 1 tablet by mouth as needed., Disp: , Rfl:  .  EPINEPHrine 0.3 mg/0.3 mL IJ SOAJ injection, Inject 0.3 mLs (0.3 mg total) into the muscle as needed for anaphylaxis., Disp: 1 each, Rfl: 1 .  estradiol (VIVELLE-DOT) 0.1 MG/24HR patch, Place 1 patch (0.1 mg total) onto the skin 2 (two) times a week., Disp: 24 patch, Rfl: 3 .  famotidine (PEPCID) 20 MG tablet, Take 1 tablet (20 mg total) by mouth daily as needed for heartburn or indigestion., Disp: 90 tablet, Rfl: 3 .  fexofenadine (ALLEGRA ALLERGY) 180 MG tablet, Take 1 tablet (180 mg total) by mouth daily., Disp: 90 tablet, Rfl: 3 .  levothyroxine (SYNTHROID) 50 MCG tablet, Take 1 tablet (50 mcg total) by mouth daily before breakfast., Disp: 90 tablet, Rfl: 3 .  losartan (COZAAR) 50 MG tablet, Take 1 tablet (50 mg total) by mouth at bedtime., Disp: 90 tablet, Rfl: 3 .  MAGNESIUM PO, Take 250 mg by mouth daily., Disp: , Rfl:  .  meclizine (ANTIVERT) 25 MG tablet, Take 1 tablet (25 mg total) by mouth 3 (three) times daily as needed., Disp: 270 tablet, Rfl: 3 .  metoprolol succinate (TOPROL-XL) 50 MG 24 hr tablet, Take 1 tablet (50 mg total) by mouth daily., Disp: 90 tablet, Rfl: 3 .  montelukast (SINGULAIR) 10 MG tablet, Take 1 tablet (10 mg total) by mouth at bedtime., Disp: 90 tablet, Rfl: 3 .  Multiple Vitamin (MULTIVITAMIN) tablet, Take 1 tablet by mouth daily., Disp: , Rfl:  .  omega-3 acid ethyl esters (LOVAZA) 1 g capsule, Take 1 capsule (1 g total) by mouth daily., Disp: 90 capsule, Rfl: 3 .  Propylene Glycol 0.6 % SOLN, Apply 1 drop to eye as needed., Disp: , Rfl:  .  psyllium (METAMUCIL) 58.6 % powder, Take 1 packet by mouth as needed., Disp: , Rfl:  .  Spacer/Aero-Holding Chambers (AEROCHAMBER MV) inhaler, Use as instructed, Disp: 1 each, Rfl: 3 .  Syringe/Needle, Disp, (SYRINGE 3CC/20GX1") 20G X 1" 3 ML MISC, 1 Syringe by Does not apply route every 30 (thirty) days. For use with b-12 injection  monthly, Disp: 50 each, Rfl: 0 .  tretinoin (RETIN-A) 0.05 % cream, Apply topically at bedtime., Disp: 45 g, Rfl: 1 .  urea (CARMOL) 10 % cream, Apply topically as needed., Disp: 71 g, Rfl: 3 .  albuterol (PROVENTIL HFA) 108 (90 Base) MCG/ACT inhaler, Inhale 2 puffs into the lungs every 6 (six) hours as needed. (Patient not taking: Reported on 10/16/2020), Disp: 18 g, Rfl: 2 .  ALPRAZolam (XANAX) 0.25 MG tablet, TAKE 1 TABLET (0.25 MG TOTAL) BY MOUTH ONCE DAILY AS NEEDED FOR SLEEP. (Patient not taking: Reported on 10/16/2020), Disp: 30 tablet, Rfl: 5 .  azithromycin (ZITHROMAX) 250 MG tablet, 2 pills day 1 with food and then 1 pill day 2-5 with food, Disp: 6 tablet, Rfl: 0 .  ondansetron (ZOFRAN ODT) 4 MG disintegrating tablet, Take 1 tablet (4 mg total) by mouth every 8 (eight) hours as needed for nausea or vomiting. (Patient not taking: Reported on 10/16/2020), Disp: 30 tablet, Rfl: 2 .  predniSONE (DELTASONE) 10 MG tablet, 4 tabs for 2 days, then 3 tabs for 2 days, 2 tabs for 2 days, then 1 tab for 2 days, then stop. Take in the morning with food, Disp: 20 tablet, Rfl: 0  EXAM:  VITALS per patient if applicable:  GENERAL: alert, oriented, appears well and in no acute distress  HEENT: atraumatic, conjunttiva clear, no obvious abnormalities on inspection of external nose and ears  NECK: normal movements of the head and neck  LUNGS: on inspection no signs of respiratory distress, breathing rate appears normal, no obvious gross SOB, gasping or wheezing  CV: no obvious cyanosis  MS: moves all visible extremities without noticeable abnormality  PSYCH/NEURO: pleasant and cooperative, no obvious depression or anxiety, speech and thought processing grossly intact  ASSESSMENT AND PLAN:  Discussed the following assessment and plan:  Chronic pansinusitis - Plan: predniSONE (DELTASONE) taper starting 10/17/20, azithromycin (ZITHROMAX) 250 MG tablet NS, allegra, dymista Sudafed x 3 days or less   Discuss Dupixent or fascenra with pulm appt 10/2020 alternative to freq allergy shots  Cont probiotics  Prn Tylenol  -we discussed possible serious and likely etiologies, options for evaluation and workup, limitations of telemedicine visit vs in person visit, treatment, treatment risks and precautions.   I discussed the assessment and treatment plan with the patient. The patient was provided an opportunity to ask questions and all were answered. The patient agreed with the plan and demonstrated an understanding of the instructions.     Nino Glow McLean-Scocuzza, MD

## 2020-10-16 NOTE — Progress Notes (Signed)
Onset of symptoms for the last month. Increasing in severity. Headaches, sinus congestion with no runny nose, ear stuffiness, fatigue, vertigo symptoms.   No one around the Patient sick. Patient has a history of allergies. Patient has not been tested. Normally has sinus infections this time of year every year.

## 2020-10-16 NOTE — Patient Instructions (Signed)
Benralizumab injection What is this medicine? BENRALIZUMAB (BEN ra LIZ oo mab) is used to help treat severe asthma. It should be used in combination with other asthma treatments. This medicine may be used for other purposes; ask your health care provider or pharmacist if you have questions. COMMON BRAND NAME(S): Berna Bue What should I tell my health care provider before I take this medicine? They need to know if you have any of these conditions:  parasitic (helminth) infection  an unusual or allergic reaction to benralizumab, hamster proteins, other medicines, foods, dyes, or preservatives  pregnant or trying to get pregnant  breast-feeding How should I use this medicine? This medicine is for injection under the skin. It may be administered by a healthcare professional in a hospital or clinic setting or at home. If you get this medicine at home, you will be taught how to prepare and give this medicine. Use exactly as directed. Take your medicine at regular intervals. Do not take your medicine more often than directed. It is important that you put your used injectors, needles and syringes in a special sharps container. Do not put them in a trash can. If you do not have a sharps container, call your pharmacist or healthcare provider to get one. Talk to your pediatrician regarding the use of this medicine in children. While this drug may be prescribed for children as young as 12 years for selected conditions, precautions do apply. Overdosage: If you think you have taken too much of this medicine contact a poison control center or emergency room at once. NOTE: This medicine is only for you. Do not share this medicine with others. What if I miss a dose? It is important not to miss your dose. Call your doctor of health care professional if you are unable to keep an appointment. If you give yourself the medicine and you miss a dose, call your doctor or health care professional for advice. What may  interact with this medicine? Interactions are not expected. This list may not describe all possible interactions. Give your health care provider a list of all the medicines, herbs, non-prescription drugs, or dietary supplements you use. Also tell them if you smoke, drink alcohol, or use illegal drugs. Some items may interact with your medicine. What should I watch for while using this medicine? Visit your healthcare professional for regular checks on your progress. NEVER use this medicine for an acute asthma attack. Tell your healthcare professional if your symptoms do not start to get better or if they get worse. Do not stop taking your other asthma medicines unless instructed to do so by your doctor or health care professional. What side effects may I notice from receiving this medicine? Side effects that you should report to your doctor or health care professional as soon as possible:  allergic reactions like skin rash, itching or hives, swelling of the face, lips, or tongue  breathing problems  signs and symptoms of low blood pressure like dizziness; feeling faint or lightheaded, falls  signs and symptoms of infection like fever or chills; cough; sore throat Side effects that usually do not require medical attention (report these to your doctor or health care professional if they continue or are bothersome):  headache  pain, redness, or irritation at the site where injected This list may not describe all possible side effects. Call your doctor for medical advice about side effects. You may report side effects to FDA at 1-800-FDA-1088. Where should I keep my medicine? Keep out of  the reach of children. Store unopened syringes or injectors in a refrigerator between 2 to 8 degrees C (36 to 46 degrees F). Keep in the original container until ready for use. Protect from light. Do not freeze. Do not shake. Prior to use, remove the syringe or injector from the refrigerator and use after 30  minutes at room temperature. Throw away any unused medicine after the expiration date on the label. If needed, the unopened syringe or injector may be stored at room temperature up to 25 degrees C (77 degrees F) for a maximum of 14 days. Once removed from the refrigerator and brought to room temperature, the syringe or autoinjector must be used within 14 days or discarded. NOTE: This sheet is a summary. It may not cover all possible information. If you have questions about this medicine, talk to your doctor, pharmacist, or health care provider.  2020 Elsevier/Gold Standard (2018-08-05 12:50:56)  Dupilumab injection What is this medicine? DUPILUMAB (doo PIL ue mab) is an injection used to treat certain patients with eczema, asthma, and sinus inflammation with nasal polyps. This medicine may be used for other purposes; ask your health care provider or pharmacist if you have questions. COMMON BRAND NAME(S): DUPIXENT What should I tell my health care provider before I take this medicine? They need to know if you have any of these conditions:  asthma  parasitic (helminth) infection  an unusual or allergic reaction to dupilumab, other medicines, foods, dyes, or preservatives  pregnant or trying to get pregnant  breast-feeding How should I use this medicine? This medicine is for injection under the skin. You will be taught how to prepare and give this medicine. Use exactly as directed. Take your medicine at regular intervals. Do not take your medicine more often than directed. It is important that you put your used needles and syringes in a special sharps container. Do not put them in a trash can. If you do not have a sharps container, call your pharmacist or healthcare provider to get one. Talk to your pediatrician regarding the use of this medicine in children. While this medicine may be prescribed for children as young as 6 years for selected conditions, precautions do apply. Overdosage: If  you think you have taken too much of this medicine contact a poison control center or emergency room at once. NOTE: This medicine is only for you. Do not share this medicine with others. What if I miss a dose? If you miss a dose, take it as soon as you can if it is within 7 days from the missed dose. If it is more than 7 days from your last dose and you are on an every other week dosing schedule, skip the dose and wait to take the next dose on the original schedule. If it is more than 7 days from your last dose and you are on an every 4-week dosing schedule, take a dose as soon as possible and start a new schedule based on this date. Do not take double or extra doses. What may interact with this medicine?  vaccines  warfarin This list may not describe all possible interactions. Give your health care provider a list of all the medicines, herbs, non-prescription drugs, or dietary supplements you use. Also tell them if you smoke, drink alcohol, or use illegal drugs. Some items may interact with your medicine. What should I watch for while using this medicine? Tell your doctor or healthcare professional if your symptoms do not start to get  better or if they get worse. You should not receive certain vaccines while using this medicine. Dupilumab may prevent a vaccine from working. Talk to your health care provider if you need to receive a vaccine while using this medicine. What side effects may I notice from receiving this medicine? Side effects that you should report to your doctor or health care professional as soon as possible:  allergic reactions like skin rash, itching or hives, swelling of the face, lips, or tongue  changes in vision  eye pain or swelling Side effects that usually do not require medical attention (report these to your doctor or health care professional if they continue or are bothersome):  cold sores  dry or itching eyes  pain, redness, or irritation at site where  injected This list may not describe all possible side effects. Call your doctor for medical advice about side effects. You may report side effects to FDA at 1-800-FDA-1088. Where should I keep my medicine? Keep out of the reach of children. Store this medicine in the refrigerator between 2 and 8 degrees C (36 and 46 degrees F) until you are ready to prepare your injection; do not freeze. You may also store at room temperature for up to 14 days if needed. Do not store above 25 degrees C (77 degrees F) or expose to heat. Throw away any unused medicine after the expiration date. NOTE: This sheet is a summary. It may not cover all possible information. If you have questions about this medicine, talk to your doctor, pharmacist, or health care provider.  2020 Elsevier/Gold Standard (2019-04-18 11:55:26)

## 2020-11-16 ENCOUNTER — Telehealth: Payer: Self-pay | Admitting: Pulmonary Disease

## 2020-11-16 DIAGNOSIS — G4733 Obstructive sleep apnea (adult) (pediatric): Secondary | ICD-10-CM

## 2020-11-16 DIAGNOSIS — Z9989 Dependence on other enabling machines and devices: Secondary | ICD-10-CM

## 2020-11-16 NOTE — Telephone Encounter (Signed)
Called and spoke with pt letting her know that VS said he was fine with [place] placing order for new cpap machine. Pt verbalized understanding. Order has been placed. Nothing further needed.

## 2020-11-16 NOTE — Telephone Encounter (Signed)
Okay to place order for new CPAP machine at 11 cm H2O with heated humidity.  She will need ROV in 3 months after getting new machine.

## 2020-11-16 NOTE — Telephone Encounter (Signed)
Spoke with pt, states that her cpap machine is giving her an error message "motor life exceeded, contact provider".  Pt unsure how long message has been showing up on machine.  Pt denies any current issues with machine function.  Pt has not contacted DME ([facility]).  Per chart pt has had machine since at least 2015, cannot see original order for machine.   Per last OV note pt is on cpap 11cmH2O.   VS ok to place new order for cpap at same settings? Thanks!

## 2020-11-19 ENCOUNTER — Ambulatory Visit: Payer: Medicare Other | Admitting: Pulmonary Disease

## 2020-11-28 ENCOUNTER — Other Ambulatory Visit: Payer: Self-pay | Admitting: Vascular Surgery

## 2020-11-28 DIAGNOSIS — Z1231 Encounter for screening mammogram for malignant neoplasm of breast: Secondary | ICD-10-CM

## 2020-12-12 ENCOUNTER — Telehealth: Payer: Self-pay | Admitting: Pulmonary Disease

## 2020-12-12 NOTE — Telephone Encounter (Signed)
Leroy Sea called me back.  Still working on this.  He was able to pull studies and sent them to person he needed to send to and she had sent him a message she needed diagnostic study.  He had messaged her that is what was sent.  He hasn't heard back from her yet.  He will get back to me.

## 2020-12-12 NOTE — Telephone Encounter (Addendum)
I located sleep study in chart - was scanned from [facility] [employer] left vm for him to call me back so I can help him locate the study.

## 2020-12-12 NOTE — Telephone Encounter (Signed)
Called and spoke with pt. Pt stated she was told by [facility] that they were needing the sleep study that was done so they can be able to get things taken care of for her due to the error message that her CPAP machine is giving her.  Order was placed 11/16/20 for pt to receive new cpap machine from [facility]. Pt had in lab study performed 08/2007 which they should have or be able to locate but pt stated that they told her they did not have this.  [facility], can you help [place] out with this please.

## 2020-12-17 NOTE — Telephone Encounter (Signed)
Received message from [facility] that they do have all they need now and when they get to a place where they are able to process replacement machines they will be able to take care of her.  Nothing further needed.

## 2020-12-17 NOTE — Telephone Encounter (Signed)
Left vm for Brad to check status of order.

## 2020-12-25 ENCOUNTER — Telehealth: Payer: Self-pay | Admitting: Pulmonary Disease

## 2020-12-25 DIAGNOSIS — H2513 Age-related nuclear cataract, bilateral: Secondary | ICD-10-CM | POA: Diagnosis not present

## 2020-12-25 MED ORDER — FLUCONAZOLE 100 MG PO TABS: 100.0000 mg | ORAL_TABLET | Freq: Every day | ORAL | 0 refills | Status: DC

## 2020-12-25 NOTE — Telephone Encounter (Signed)
Okay to send order for fluconazole 100 mg daily for 5 days.

## 2020-12-25 NOTE — Telephone Encounter (Signed)
Called and spoke with patient.  She states she gets this every so often. She believes its from her Dymista and not the inhaler because she gargles vigorously after using inhaler.  She states the pills work better than any liquid for swishing and spitting. She would like [facility] called into [facility] [place]. She takes a 5 day course of Fluconazole 100mg .  Dr. Halford Chessman please let us know if this is okay to send in for patient

## 2020-12-25 NOTE — Telephone Encounter (Signed)
Spoke with pt and informed [facility] would be [facility] [place]. Pt stated understanding. Fluconazole order placed. Nothing further needed at this time.

## 2020-12-29 ENCOUNTER — Emergency Department: Payer: Medicare Other

## 2020-12-29 ENCOUNTER — Other Ambulatory Visit: Payer: Self-pay

## 2020-12-29 ENCOUNTER — Emergency Department
Admission: EM | Admit: 2020-12-29 | Discharge: 2020-12-29 | Disposition: A | Discharge status: Home / Self Care | Payer: Medicare Other | Attending: Emergency Medicine | Admitting: Emergency Medicine

## 2020-12-29 ENCOUNTER — Encounter: Payer: Self-pay | Admitting: Intensive Care

## 2020-12-29 DIAGNOSIS — Z7951 Long term (current) use of inhaled steroids: Secondary | ICD-10-CM | POA: Diagnosis not present

## 2020-12-29 DIAGNOSIS — R079 Chest pain, unspecified: Secondary | ICD-10-CM | POA: Insufficient documentation

## 2020-12-29 DIAGNOSIS — E039 Hypothyroidism, unspecified: Secondary | ICD-10-CM | POA: Insufficient documentation

## 2020-12-29 DIAGNOSIS — R42 Dizziness and giddiness: Secondary | ICD-10-CM

## 2020-12-29 DIAGNOSIS — Z79899 Other long term (current) drug therapy: Secondary | ICD-10-CM | POA: Diagnosis not present

## 2020-12-29 DIAGNOSIS — R0602 Shortness of breath: Secondary | ICD-10-CM | POA: Diagnosis not present

## 2020-12-29 DIAGNOSIS — R110 Nausea: Secondary | ICD-10-CM | POA: Diagnosis not present

## 2020-12-29 DIAGNOSIS — Z87891 Personal history of nicotine dependence: Secondary | ICD-10-CM | POA: Diagnosis not present

## 2020-12-29 DIAGNOSIS — R200 Anesthesia of skin: Secondary | ICD-10-CM | POA: Diagnosis not present

## 2020-12-29 DIAGNOSIS — J449 Chronic obstructive pulmonary disease, unspecified: Secondary | ICD-10-CM | POA: Insufficient documentation

## 2020-12-29 DIAGNOSIS — I10 Essential (primary) hypertension: Secondary | ICD-10-CM | POA: Insufficient documentation

## 2020-12-29 DIAGNOSIS — R0789 Other chest pain: Secondary | ICD-10-CM | POA: Diagnosis not present

## 2020-12-29 DIAGNOSIS — J45909 Unspecified asthma, uncomplicated: Secondary | ICD-10-CM | POA: Insufficient documentation

## 2020-12-29 DIAGNOSIS — R030 Elevated blood-pressure reading, without diagnosis of hypertension: Secondary | ICD-10-CM | POA: Diagnosis not present

## 2020-12-29 DIAGNOSIS — R202 Paresthesia of skin: Secondary | ICD-10-CM | POA: Diagnosis not present

## 2020-12-29 LAB — CBC
HCT: 40.6 % (ref 36.0–46.0)
Hemoglobin: 13.6 g/dL (ref 12.0–15.0)
MCH: 30.8 pg (ref 26.0–34.0)
MCHC: 33.5 g/dL (ref 30.0–36.0)
MCV: 92.1 fL (ref 80.0–100.0)
Platelets: 298 10*3/uL (ref 150–400)
RBC: 4.41 MIL/uL (ref 3.87–5.11)
RDW: 12.5 % (ref 11.5–15.5)
WBC: 8.1 10*3/uL (ref 4.0–10.5)
nRBC: 0 % (ref 0.0–0.2)

## 2020-12-29 LAB — BASIC METABOLIC PANEL
Anion gap: 11 (ref 5–15)
BUN: 10 mg/dL (ref 8–23)
CO2: 25 mmol/L (ref 22–32)
Calcium: 9.5 mg/dL (ref 8.9–10.3)
Chloride: 98 mmol/L (ref 98–111)
Creatinine, Ser: 0.78 mg/dL (ref 0.44–1.00)
GFR, Estimated: >60 mL/min (ref >60)
Glucose, Bld: 101 mg/dL — ABNORMAL HIGH (ref 70–99)
Potassium: 3.7 mmol/L (ref 3.5–5.1)
Sodium: 134 mmol/L — ABNORMAL LOW (ref 135–145)

## 2020-12-29 LAB — TROPONIN I (HIGH SENSITIVITY)
Troponin I (High Sensitivity): 3 ng/L (ref <18)
Troponin I (High Sensitivity): 4 ng/L (ref <18)

## 2020-12-29 MED ORDER — VANCOMYCIN HCL 1750 MG/350ML IV SOLN
1750.0000 mg | Freq: Once | INTRAVENOUS | Status: DC
  Filled 2020-12-29: qty 350

## 2020-12-29 MED ORDER — LORAZEPAM 2 MG/ML IJ SOLN
0.5000 mg | Freq: Once | INTRAMUSCULAR | Status: AC
  Administered 2020-12-29: 0.5 mg via INTRAVENOUS
  Filled 2020-12-29: qty 1

## 2020-12-29 MED ORDER — SODIUM CHLORIDE 0.9 % IV BOLUS
1000.0000 mL | Freq: Once | INTRAVENOUS | Status: AC
  Administered 2020-12-29: 1000 mL via INTRAVENOUS

## 2020-12-29 MED ORDER — VANCOMYCIN HCL 1250 MG/250ML IV SOLN: 1250.0000 mg | Freq: Once | INTRAVENOUS | Status: DC

## 2020-12-29 MED ORDER — SODIUM CHLORIDE 0.9 % IV SOLN: 2.0000 g | Freq: Once | INTRAVENOUS | Status: DC

## 2020-12-29 MED ORDER — METRONIDAZOLE IN NACL 5-0.79 MG/ML-% IV SOLN: 500.0000 mg | Freq: Once | INTRAVENOUS | Status: DC

## 2020-12-29 NOTE — ED Triage Notes (Addendum)
Patient c/o intermittent chest heaviness for a month with nausea. C/o pain in bilateral ear with left being worse, dizziness, pain on left side of head, and numbness to left hand off and on. A&o x3. Denies sob

## 2020-12-29 NOTE — ED Provider Notes (Signed)
[facility] Emergency Department Provider Note  ____________________________________________   Event Date/Time   First MD Initiated Contact with Patient 12/29/20 1704     (approximate)  I have reviewed the triage vital signs and the nursing notes.   HISTORY  Chief Complaint Chest Pain    HPI Angela Mendoza is a 70 y.o. female presents emergency department complaining of dizziness secondary to Mnire's disease, intermittent chest pain for 1 month, and some shortness of breath.  Patient has history of A. fib.  States she is having worse hot flashes due to menopause.  Went to [facility] and they were concerned about the dizziness and chest pain combined together.    Past Medical History:  Diagnosis Date  . ABNORMAL HEART RHYTHMS 06/09/2008  . Allergic rhinitis 06/09/2008   chronic  . Asthma, intrinsic 10/06/2011   controlled on qvar. [name] 09/2011:  Very mild airflow obstruction (FEV1% 68, FVL c/w obstruction)   . Chronic headache 06/09/2008  . COPD (chronic obstructive pulmonary disease) ([place])   . Depression   . Diverticulosis 2013   h/o diverticulitis  . Dry eyes   . GERD (gastroesophageal reflux disease)   . History of breast implant removal    silicone mastitis - R axilla silicone LN, free silicone L breast upper outer quadrant  . Hot flashes   . HYPERLIPIDEMIA 06/09/2008  . HYPERTENSION 06/09/2008  . Hypothyroidism   . MVP (mitral valve prolapse)   . OSA on CPAP    [name]  . Pernicious anemia    per prior pcp  . Rosacea   . Surgical menopause 1991  . Vertigo   . Vitamin D deficiency     Patient Active Problem List   Diagnosis Date Noted  . Insomnia due to medical condition 05/29/2020  . Left ear pain 05/15/2020  . Educated about COVID-19 virus infection 02/10/2019  . History of atrial fibrillation 07/02/2018  . Paroxysmal atrial fibrillation ([place]) 07/02/2018  . B12 deficiency 05/02/2018  . Vaginal vault prolapse after  hysterectomy 05/01/2018  . Fatigue 05/01/2018  . Arthritis pain, hand 08/27/2017  . Trigger finger, right ring finger 06/09/2017  . Reaction to Pneumovax immunization 12/30/2015  . Cerebral microvascular disease 10/12/2015  . Constipation 04/14/2015  . Allergic urticaria 11/18/2014  . History of diverticulitis 11/11/2014  . Family history of colonic polyps 11/11/2014  . S/P cholecystectomy 11/11/2014  . Upper airway cough syndrome 10/27/2014  . Hyperkeratosis of sole 12/19/2013  . Anxiety   . GERD (gastroesophageal reflux disease)   . Hypothyroidism   . Vertigo   . Menopausal syndrome   . Dry eyes   . Asthma, intrinsic 10/06/2011  . Mixed hyperlipidemia 06/09/2008  . OSA on CPAP 06/09/2008  . Essential hypertension, benign 06/09/2008  . Non-seasonal allergic rhinitis 06/09/2008  . ANGINA, HX OF 06/09/2008    Past Surgical History:  Procedure Laterality Date  . APPENDECTOMY  2007  . BREAST BIOPSY Left 09/27/2009  . BREAST ENHANCEMENT SURGERY  2006  . CARDIOVASCULAR STRESS TEST  2013   [place] [name] - WNL, EF 55-60%, with 0 calcium score  . CHOLECYSTECTOMY  2007   biliary dyskinesia  . COLONOSCOPY  08/2012   diverticulosis  . COLONOSCOPY  2006   inflamed hyperplastic polyp  . ESOPHAGOGASTRODUODENOSCOPY  08/2012   esophageal reflux  . HERNIA REPAIR    . NASAL SINUS SURGERY    . RECTOCELE REPAIR  12/2008   [name]  . sleep study  08/2007  OSA, 12 cm H2O,  . TOTAL ABDOMINAL HYSTERECTOMY  1991   heavy bleeding, ovaries removed  . TUBAL LIGATION    . US ECHOCARDIOGRAPHY  09/2009   impaired relaxation, mild tric regurg, normal MV, EF 60%  . US ECHOCARDIOGRAPHY  11/2007   mild-mod MR    Prior to Admission medications   Medication Sig Start Date End Date Taking? Authorizing Provider  albuterol (PROVENTIL HFA) 108 (90 Base) MCG/ACT inhaler Inhale 2 puffs into the lungs every 6 (six) hours as needed. Patient not taking: Reported on 10/16/2020 05/08/20   Chesley Mires, MD  ALPRAZolam Duanne Moron) 0.25 MG tablet TAKE 1 TABLET (0.25 MG TOTAL) BY MOUTH ONCE DAILY AS NEEDED FOR SLEEP. Patient not taking: Reported on 10/16/2020 05/28/20   Crecencio Mc, MD  atorvastatin (LIPITOR) 40 MG tablet Take 1 tablet (40 mg total) by mouth daily. 05/28/20   Crecencio Mc, MD  Azelastine-Fluticasone (DYMISTA) 137-50 MCG/ACT SUSP Place 1 spray into the nose 2 (two) times daily. 07/31/20   Crecencio Mc, MD  azithromycin (ZITHROMAX) 250 MG tablet 2 pills day 1 with food and then 1 pill day 2-5 with food 10/16/20   McLean-Scocuzza, Nino Glow, MD  beclomethasone (QVAR REDIHALER) 80 MCG/ACT inhaler Inhale 2 puffs into the lungs 2 (two) times daily. 05/08/20   Chesley Mires, MD  Cholecalciferol (VITAMIN D3) 2000 units TABS Take 1 tablet by mouth daily. 05/11/18   Crecencio Mc, MD  Coenzyme Q10 300 MG CAPS Take 1 capsule by mouth daily.    [provider]  conjugated estrogens (PREMARIN) vaginal cream Place 1 Applicatorful vaginally 2 (two) times a week. 90 day supply,  Refill for one year 05/28/20   Crecencio Mc, MD  cyanocobalamin (,VITAMIN B-12,) 1000 MCG/ML injection Inject 1 mL (1,000 mcg total) into the muscle every 30 (thirty) days. 05/28/20   Crecencio Mc, MD  cycloSPORINE (RESTASIS) 0.05 % ophthalmic emulsion Place 1 drop into both eyes 2 (two) times daily. 05/28/20   Crecencio Mc, MD  diazepam (VALIUM) 5 MG tablet TAKE 1 TABLET BY MOUTH EVERY 6 HOURS AS NEEDED FOR VERTIGO 05/27/19   Crecencio Mc, MD  Docusate Calcium (STOOL SOFTENER PO) Take 1 tablet by mouth as needed.    [provider]  EPINEPHrine 0.3 mg/0.3 mL IJ SOAJ injection Inject 0.3 mLs (0.3 mg total) into the muscle as needed for anaphylaxis. 05/28/20   Crecencio Mc, MD  estradiol (VIVELLE-DOT) 0.1 MG/24HR patch Place 1 patch (0.1 mg total) onto the skin 2 (two) times a week. 05/28/20   Crecencio Mc, MD  famotidine (PEPCID) 20 MG tablet Take 1 tablet (20 mg total) by mouth daily as needed  for heartburn or indigestion. 05/28/20   Crecencio Mc, MD  fexofenadine (ALLEGRA ALLERGY) 180 MG tablet Take 1 tablet (180 mg total) by mouth daily. 01/08/15   Crecencio Mc, MD  fluconazole (DIFLUCAN) 100 MG tablet Take 1 tablet (100 mg total) by mouth daily. 12/25/20   Chesley Mires, MD  levothyroxine (SYNTHROID) 50 MCG tablet Take 1 tablet (50 mcg total) by mouth daily before breakfast. 05/28/20   Crecencio Mc, MD  losartan (COZAAR) 50 MG tablet Take 1 tablet (50 mg total) by mouth at bedtime. 05/28/20   Crecencio Mc, MD  MAGNESIUM PO Take 250 mg by mouth daily.    [provider]  meclizine (ANTIVERT) 25 MG tablet Take 1 tablet (25 mg total) by mouth 3 (three) times  daily as needed. 05/28/20   Crecencio Mc, MD  metoprolol succinate (TOPROL-XL) 50 MG 24 hr tablet Take 1 tablet (50 mg total) by mouth daily. 05/28/20   Crecencio Mc, MD  montelukast (SINGULAIR) 10 MG tablet Take 1 tablet (10 mg total) by mouth at bedtime. 05/28/20   Crecencio Mc, MD  Multiple Vitamin (MULTIVITAMIN) tablet Take 1 tablet by mouth daily.    [provider]  omega-3 acid ethyl esters (LOVAZA) 1 g capsule Take 1 capsule (1 g total) by mouth daily. 12/13/19   Crecencio Mc, MD  ondansetron (ZOFRAN ODT) 4 MG disintegrating tablet Take 1 tablet (4 mg total) by mouth every 8 (eight) hours as needed for nausea or vomiting. Patient not taking: Reported on 10/16/2020 05/28/20   Crecencio Mc, MD  predniSONE (DELTASONE) 10 MG tablet 4 tabs for 2 days, then 3 tabs for 2 days, 2 tabs for 2 days, then 1 tab for 2 days, then stop. Take in the morning with food 10/16/20   McLean-Scocuzza, Nino Glow, MD  Propylene Glycol 0.6 % SOLN Apply 1 drop to eye as needed.    [provider]  psyllium (METAMUCIL) 58.6 % powder Take 1 packet by mouth as needed.    [provider]  Spacer/Aero-Holding Chambers (AEROCHAMBER MV) inhaler Use as instructed 03/24/18   Chesley Mires, MD  Syringe/Needle, Disp,  (SYRINGE 3CC/20GX1") 20G X 1" 3 ML MISC 1 Syringe by Does not apply route every 30 (thirty) days. For use with b-12 injection monthly 05/27/19   Crecencio Mc, MD  tretinoin (RETIN-A) 0.05 % cream Apply topically at bedtime. 05/28/20   Crecencio Mc, MD  urea (CARMOL) 10 % cream Apply topically as needed. 05/28/20   Crecencio Mc, MD    Allergies Codeine, Sulfonamide derivatives, and Estradiol  Family History  Problem Relation Age of Onset  . Cancer Mother 84       lung, passive smoke  . Cancer Father 58       lung, smoker  . Cancer Paternal Aunt        breast, lung  . Cancer Cousin        breast  . Stroke Sister        TIA  . Hypertension Sister   . Heart failure Sister   . Aortic dissection Paternal Uncle   . Heart attack Paternal Uncle   . AAA (abdominal aortic aneurysm) Paternal Uncle   . Aortic dissection Maternal Aunt   . Breast cancer Maternal Aunt   . Heart disease Paternal Uncle   . Stroke Paternal Uncle     Social History Social History   Tobacco Use  . Smoking status: Former Smoker    Packs/day: 0.30    Years: 7.00    Pack years: 2.10    Types: Cigarettes    Quit date: 10/27/1973    Years since quitting: 47.2  . Smokeless tobacco: Never Used  . 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 Hx of children who are grown  Vaping Use  . Vaping Use: Never used  Substance Use Topics  . Alcohol use: Not Currently    Alcohol/week: 0.0 standard drinks  . Drug use: No    Review of Systems  Constitutional: No fever/chills Eyes: No visual changes. ENT: No sore throat. Respiratory: Denies cough Cardiovascular: Positive chest pain Gastrointestinal: Denies abdominal pain Genitourinary: Negative for dysuria. Musculoskeletal: Negative for back pain. Skin: Negative for rash. Psychiatric: no mood  changes,     ____________________________________________   PHYSICAL EXAM:  VITAL SIGNS: ED Triage Vitals  Enc Vitals Group     BP  12/29/20 1411 (!) 144/82     Pulse Rate 12/29/20 1411 80     Resp 12/29/20 1411 16     Temp 12/29/20 1411 98.5 F (36.9 C)     Temp Source 12/29/20 1411 Oral     SpO2 12/29/20 1411 99 %     Weight 12/29/20 1421 179 lb (81.2 kg)     Height 12/29/20 1421 5' 5.5" (1.664 m)     Head Circumference --      Peak Flow --      Pain Score 12/29/20 1420 7     Pain Loc --      Pain Edu? --      Excl. in [place]? --     Constitutional: Alert and oriented. Well appearing and in no acute distress. Eyes: Conjunctivae are normal.  2 beats nystagmus bilaterally Head: Atraumatic. Ears: TMs are clear bilaterally Nose: No congestion/rhinnorhea. Mouth/Throat: Mucous membranes are moist.   Neck:  supple no lymphadenopathy noted Cardiovascular: Normal rate, regular rhythm. Heart sounds are normal Respiratory: Normal respiratory effort.  No retractions, lungs c t a  Abd: soft nontender bs normal all 4 quad GU: deferred Musculoskeletal: FROM all extremities, warm and well perfused Neurologic:  Normal speech and language.  Skin:  Skin is warm, dry and intact. No rash noted. Psychiatric: Mood and affect are normal. Speech and behavior are normal.  ____________________________________________   LABS (all labs ordered are listed, but only abnormal results are displayed)  Labs Reviewed  BASIC METABOLIC PANEL - Abnormal; Notable for the following components:      Result Value   Sodium 134 (*)    Glucose, Bld 101 (*)    All other components within normal limits  CBC  TROPONIN I (HIGH SENSITIVITY)  TROPONIN I (HIGH SENSITIVITY)   ____________________________________________   ____________________________________________  RADIOLOGY  Chest x-ray  ____________________________________________   PROCEDURES  Procedure(s) performed: EKG, see physician read   Procedures    ____________________________________________   INITIAL IMPRESSION / ASSESSMENT AND PLAN / ED COURSE  Pertinent labs &  imaging results that were available during my care of the patient were reviewed by me and considered in my medical decision making (see chart for details).   Patient 70 year old female presents with intermittent chest pain and dizziness secondary to Mnire's disease.  See HPI.  Physical exam shows the patient to appear stable.  DDx: STEMI, NSTEMI, angina, anxiety, A. fib, Mnire's disease  CBC is normal, basic metabolic panel is normal, troponins normal  Chest x-ray reviewed by me and confirmed by radiology to have no acute abnormality  EKG shows normal sinus rhythm, see physician  Anticipate patient be discharged.  Do not feel that she has any acute cardiac abnormality time.  We will give her fluids and Ativan IV to see if this helps with her dizziness  Discussed with Dr. Joni Fears.  No further imaging needed at this time.  Patient still very dizzy with ambulation after IV fluids and Ativan.  We will do CT of the head   Ct head is negative, explained to the patient, she is to f/u with her regular doctor and cardiology.  Return to the ER if worsening, discharged in stable condition in the care of her husband  Angela Mendoza was evaluated in Emergency Department on 12/29/2020 for the symptoms described in the history of present  illness. She was evaluated in the context of the global COVID-19 pandemic, which necessitated consideration that the patient might be at risk for infection with the SARS-CoV-2 virus that causes COVID-19. Institutional protocols and algorithms that pertain to the evaluation of patients at risk for COVID-19 are in a state of rapid change based on information released by regulatory bodies including the CDC and federal and state organizations. These policies and algorithms were followed during the patient's care in the ED.    As part of my medical decision making, I reviewed the following data within the [place] History obtained from family, Nursing notes  reviewed and incorporated, Labs reviewed , EKG interpreted NSR, Old EKG reviewed, Old chart reviewed, Radiograph reviewed , Notes from prior ED visits and [place] Controlled Substance Database  ____________________________________________   FINAL CLINICAL IMPRESSION(S) / ED DIAGNOSES  Final diagnoses:  Dizziness  Nonspecific chest pain      NEW MEDICATIONS STARTED DURING THIS VISIT:  New Prescriptions   No medications on file     Note:  This document was prepared using Dragon voice recognition software and may include unintentional dictation errors.    Versie Starks, PA-C 12/29/20 Harrington Challenger, MD 12/29/20 [phone]

## 2020-12-29 NOTE — Discharge Instructions (Addendum)
Follow-up with your regular doctor and cardiology.  Please call for an appointment.  Return the emergency department worsening.  Continue to drink plenty of fluids and take your regular medications.

## 2020-12-29 NOTE — Consult Note (Signed)
PHARMACY -  BRIEF ANTIBIOTIC NOTE   Pharmacy has received consult(s) for vancomycin and cefepime from an ED provider.  The patient's profile has been reviewed for ht/wt/allergies/indication/available labs.    One time order(s) placed for cefepime 2 g and vancomycin 1750 mg  Further antibiotics/pharmacy consults should be ordered by admitting physician if indicated.                       Thank you, Darnelle Bos 12/29/2020  6:09 PM

## 2020-12-29 NOTE — ED Notes (Addendum)
Pt presents to ED with c/o of chest pain that has been intermittent "for awhile". Pt states no SOB and pt denies cardiac HX other than a-fib which pt states is controlled by lopressor. Pt states pain is sharp in nature and is L sided and also occassionally radiates to under her L breast. Pt denies V/D. But states intermittent nausea but also states a HX of vertigo that pt states "has been acting up". Pt is A&Ox4.

## 2020-12-29 NOTE — ED Notes (Signed)
Pt is resting comfortably at this time. Pt told she could call husband to come back and be with here. IVF. Warm blanket provided. Pt denies any other needs at this time. Call bell in reach.

## 2020-12-31 ENCOUNTER — Telehealth: Payer: Self-pay | Admitting: Internal Medicine

## 2020-12-31 NOTE — Telephone Encounter (Signed)
patient called in wanted to if Dr.Tullo wanted to see her for a follow up from ED  she is schedule to see her cardio doctor on thursday

## 2020-12-31 NOTE — Telephone Encounter (Signed)
Pt was seen in ED for dizziness and chest that had been occurring for 1 month intermittently. Pt is scheduled to follow up with her cardiologist on Thursday but wanted to know if she needed to follow up with you as well.

## 2020-12-31 NOTE — Telephone Encounter (Signed)
She has one for march 23 per earlier note from [place]

## 2020-12-31 NOTE — Telephone Encounter (Signed)
Spoke with pt to let her know that she will just need to keep the appt she has scheduled on 01/16/2021. Pt gave a verbal understanding.

## 2020-12-31 NOTE — Telephone Encounter (Signed)
Patient called Access Nurse on 12/29/20 with complaint of "states that she feels like she has an ear infection with moments of vertigo; ear pain, nasal congestion with green phlegm, and woozy. Her symptoms started Tuesday and has gotten progressively worse since. Thought it was allergies at first but states symptoms are worsening and having a lot of ear pain. Denies fever" Patient was advised to go to UC patient followed advice went to [facility] on 12/29/2020 and was sent TO ER with Complaint of dizziness and chest pain.  Given IV fluids and Ativan, after testing ruled out acute cardiology related issues.  Patient was advised to follow up with PCP .  Has appointment with PCP for 01/16/21.

## 2021-01-02 NOTE — Progress Notes (Signed)
Cardiology Office Note:    Date:  01/03/2021   ID:  Angela Mendoza, DOB 1951-02-08, MRN [id number]  PCP:  Sherlene Shams, MD  [facility] Cardiologist:  No primary care provider on file.  [facility] Electrophysiologist:  None   Referring MD: Sherlene Shams, MD   Chief Complaint: ED follow-up  History of Present Illness:    Angela Mendoza is a 70 y.o. female with a hx of remote afib, palpitations, anxiety, OSA on CPAP, asthma, HTN, mild -moderate MR by echo 2009, Meniere's who is being seen for ED follow-up. Afib was diagnosed in 2005 in [place] and saw Dr. Dulce Sellar. She reported feeling palpitations. She was not started on a/c. She was started on metoprolol for rate control. No documented Afib in chart review. She has a family history of aortic abdominal dissection. She saw Dr. Mariah Milling in 2019 for palpitations/afib. She was on only aspirin and metoprolol. History of swelling on amlodipine. EKG showed SR. She was started on Losartan for better BP control.   Echo from 2010 showed normal EF 60%, impaired relaxation, mild TR, normal MV. Stress test 2013 was normal was EF 55-60%. Cardiac scoring per CT was 3, 49th percentile for age and sex (2019).   The patient went to the ER at [facility] 12/29/20 for chest pain. She was at [facility] clinic for chest pain, dizziness, and possible ear infection. Her BP was severely elevated, 175/103 and was sent to the ER. Troponin normal and EKG with SR. She was given IVF for dizziness. CT head negative. She was given Ativan which seemed to calm the patient down and improve BP. No medications were changed and cardiology follow-up was recommended.   Today, the patient reports she has been taking her BP. Home Bps appear to be relatively well-controlled, 120s/70s. She had an episode of palpitations and BP elevated to 140s/80s and she took a Xanax which helped. She is still having dizziness, which is different than her dizziness with Meniers dz. She  describes it as feeling off-balance. She feels like over the last year she has been experiencing palpitations and chest pain, however these have gotten worse in the last month. Also stress has been worse at home int he last month due to selling their house and moving. Chest pain is left sided and it's a sharp pain, sometimes a pressure. Only lasts a minute or so and then self-resolves. Not exertional. Not related to palpitations.     Past Medical History:  Diagnosis Date  . Agatston coronary artery calcium score less than 100    a. 06/2018 Cardiac CT: Ca2+ = 3 (49th %'ile).  . Allergic rhinitis 06/09/2008   chronic  . Aortic atherosclerosis (HCC)    a. 06/2018 noted on chest CT.  [name] Asthma, intrinsic 10/06/2011   controlled on qvar. Cleda Daub 09/2011:  Very mild airflow obstruction (FEV1% 68, FVL c/w obstruction)   . Chronic headache 06/09/2008  . COPD (chronic obstructive pulmonary disease) (HCC)   . Depression   . Diverticulosis 2013   h/o diverticulitis  . Dry eyes   . GERD (gastroesophageal reflux disease)   . History of breast implant removal    silicone mastitis - R axilla silicone LN, free silicone L breast upper outer quadrant  . Hot flashes   . HYPERLIPIDEMIA 06/09/2008  . HYPERTENSION 06/09/2008  . Hypothyroidism   . MVP (mitral valve prolapse)   . OSA on CPAP    [name]  . PAF (paroxysmal atrial fibrillation) (HCC)  06/09/2008   a. CHA2DS2VASc = 3-->prefers ASA.  [name] Pernicious anemia    per prior pcp  . Rosacea   . Surgical menopause 1991  . Vertigo   . Vitamin D deficiency     Past Surgical History:  Procedure Laterality Date  . APPENDECTOMY  2007  . BREAST BIOPSY Left 09/27/2009  . BREAST ENHANCEMENT SURGERY  2006  . CARDIOVASCULAR STRESS TEST  2013   [place] Dr. Dulce Sellar - WNL, EF 55-60%, with 0 calcium score  . CHOLECYSTECTOMY  2007   biliary dyskinesia  . COLONOSCOPY  08/2012   diverticulosis  . COLONOSCOPY  2006   inflamed hyperplastic polyp  .  ESOPHAGOGASTRODUODENOSCOPY  08/2012   esophageal reflux  . HERNIA REPAIR    . NASAL SINUS SURGERY    . RECTOCELE REPAIR  12/2008   Dr. Thamas Jaegers  . sleep study  08/2007   OSA, 12 cm H2O,  . TOTAL ABDOMINAL HYSTERECTOMY  1991   heavy bleeding, ovaries removed  . TUBAL LIGATION    . US ECHOCARDIOGRAPHY  09/2009   impaired relaxation, mild tric regurg, normal MV, EF 60%  . US ECHOCARDIOGRAPHY  11/2007   mild-mod MR    Current Medications: Current Meds  Medication Sig  . albuterol (PROVENTIL HFA) 108 (90 Base) MCG/ACT inhaler Inhale 2 puffs into the lungs every 6 (six) hours as needed.  . ALPRAZolam (XANAX) 0.25 MG tablet TAKE 1 TABLET (0.25 MG TOTAL) BY MOUTH ONCE DAILY AS NEEDED FOR SLEEP.  [name] atorvastatin (LIPITOR) 40 MG tablet Take 1 tablet (40 mg total) by mouth daily.  . Azelastine-Fluticasone (DYMISTA) 137-50 MCG/ACT SUSP Place 1 spray into the nose 2 (two) times daily.  . beclomethasone (QVAR REDIHALER) 80 MCG/ACT inhaler Inhale 2 puffs into the lungs 2 (two) times daily.  . Cholecalciferol (VITAMIN D3) 2000 units TABS Take 1 tablet by mouth daily.  . Coenzyme Q10 300 MG CAPS Take 1 capsule by mouth daily.  [name] conjugated estrogens (PREMARIN) vaginal cream Place 1 Applicatorful vaginally 2 (two) times a week. 90 day supply,  Refill for one year  . cyanocobalamin (,VITAMIN B-12,) 1000 MCG/ML injection Inject 1 mL (1,000 mcg total) into the muscle every 30 (thirty) days.  . cycloSPORINE (RESTASIS) 0.05 % ophthalmic emulsion Place 1 drop into both eyes 2 (two) times daily.  . diazepam (VALIUM) 5 MG tablet TAKE 1 TABLET BY MOUTH EVERY 6 HOURS AS NEEDED FOR VERTIGO  . Docusate Calcium (STOOL SOFTENER PO) Take 1 tablet by mouth as needed.  [name] EPINEPHrine 0.3 mg/0.3 mL IJ SOAJ injection Inject 0.3 mLs (0.3 mg total) into the muscle as needed for anaphylaxis.  [name] estradiol (VIVELLE-DOT) 0.1 MG/24HR patch Place 1 patch (0.1 mg total) onto the skin 2 (two) times a week.  . famotidine (PEPCID) 20 MG  tablet Take 1 tablet (20 mg total) by mouth daily as needed for heartburn or indigestion.  . fexofenadine (ALLEGRA ALLERGY) 180 MG tablet Take 1 tablet (180 mg total) by mouth daily.  [name] levothyroxine (SYNTHROID) 50 MCG tablet Take 1 tablet (50 mcg total) by mouth daily before breakfast.  . losartan (COZAAR) 50 MG tablet Take 1 tablet (50 mg total) by mouth at bedtime.  [name] MAGNESIUM PO Take 250 mg by mouth daily.  . meclizine (ANTIVERT) 25 MG tablet Take 1 tablet (25 mg total) by mouth 3 (three) times daily as needed.  . metoprolol succinate (TOPROL-XL) 50 MG 24 hr tablet Take 1 tablet (50 mg total) by mouth daily.  [name]  montelukast (SINGULAIR) 10 MG tablet Take 1 tablet (10 mg total) by mouth at bedtime.  . Multiple Vitamin (MULTIVITAMIN) tablet Take 1 tablet by mouth daily.  [name] omega-3 acid ethyl esters (LOVAZA) 1 g capsule Take 1 capsule (1 g total) by mouth daily.  . ondansetron (ZOFRAN ODT) 4 MG disintegrating tablet Take 1 tablet (4 mg total) by mouth every 8 (eight) hours as needed for nausea or vomiting.  [name] Propylene Glycol 0.6 % SOLN Apply 1 drop to eye as needed.  . psyllium (METAMUCIL) 58.6 % powder Take 1 packet by mouth as needed.  [name] Spacer/Aero-Holding Chambers (AEROCHAMBER MV) inhaler Use as instructed  . Syringe/Needle, Disp, (SYRINGE 3CC/20GX1") 20G X 1" 3 ML MISC 1 Syringe by Does not apply route every 30 (thirty) days. For use with b-12 injection monthly  . tretinoin (RETIN-A) 0.05 % cream Apply topically at bedtime.  . urea (CARMOL) 10 % cream Apply topically as needed.     Allergies:   Codeine, Sulfonamide derivatives, and Estradiol   Social History   Socioeconomic History  . Marital status: Married    Spouse name: Not on file  . Number of children: Not on file  . Years of education: Not on file  . Highest education level: Not on file  Occupational History  . Occupation: retired  Tobacco Use  . Smoking status: Former Smoker    Packs/day: 0.30    Years: 7.00    Pack  years: 2.10    Types: Cigarettes    Quit date: 10/27/1973    Years since quitting: 47.2  . Smokeless tobacco: Never Used  . 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 Hx of children who are grown  Vaping Use  . Vaping Use: Never used  Substance and Sexual Activity  . Alcohol use: Not Currently    Alcohol/week: 0.0 standard drinks  . Drug use: No  . Sexual activity: Not on file  Other Topics Concern  . Not on file  Social History Narrative   Lives with husband, cat   Grown children   Occ: retired, was Research scientist (medical)   Edu: trade degree then 2 yrscollege   Social Determinants of Corporate investment banker Strain: Not on file  Food Insecurity: No Food Insecurity  . Worried About Programme researcher, broadcasting/film/video in the Last Year: Never true  . Ran Out of Food in the Last Year: Never true  Transportation Needs: No Transportation Needs  . Lack of Transportation (Medical): No  . Lack of Transportation (Non-Medical): No  Physical Activity: Not on file  Stress: Not on file  Social Connections: Unknown  . Frequency of Communication with Friends and Family: More than three times a week  . Frequency of Social Gatherings with Friends and Family: Not on file  . Attends Religious Services: Not on file  . Active Member of Clubs or Organizations: Not on file  . Attends Banker Meetings: Not on file  . Marital Status: Married     Family History: The patient's family history includes AAA (abdominal aortic aneurysm) in her paternal uncle; Aortic dissection in her maternal aunt and paternal uncle; Breast cancer in her maternal aunt; Cancer in her cousin and paternal aunt; Cancer (age of onset: 36) in her father; Cancer (age of onset: 56) in her mother; Heart attack in her paternal uncle; Heart disease in her paternal uncle; Heart failure in her sister; Hypertension in her sister; Stroke in her paternal uncle and sister.  ROS:   Please see the history of present  illness.     All other systems reviewed and are negative.  EKGs/Labs/Other Studies Reviewed:    The following studies were reviewed today:  CARDIOVASCULAR STRESS TEST  2013 [place] Dr. Dulce Sellar - WNL, EF 55-60%, with 0 calcium score     Echo 2010  impaired relaxation, mild tric regurg, normal MV, EF 60%  EKG:  EKG is  ordered today.  The ekg ordered today demonstrates   Recent Labs: 05/28/2020: ALT 22; TSH 1.35 12/29/2020: BUN 10; Creatinine, Ser 0.78; Hemoglobin 13.6; Platelets 298; Potassium 3.7; Sodium 134  Recent Lipid Panel    Component Value Date/Time   CHOL 182 05/28/2020 1606   TRIG 220.0 (H) 05/28/2020 1606   TRIG 199 04/12/2013 0000   HDL 62.50 05/28/2020 1606   CHOLHDL 3 05/28/2020 1606   VLDL 44.0 (H) 05/28/2020 1606   LDLCALC 81 12/08/2019 0920   LDLCALC 93 04/12/2013 0000   LDLDIRECT 83.0 05/28/2020 1606     Physical Exam:    VS:  BP 130/80 (BP Location: Left Arm, Patient Position: Sitting, Cuff Size: Large)   Pulse 71   Ht 5' 5.5" (1.664 m)   Wt 179 lb (81.2 kg)   SpO2 97%   BMI 29.33 kg/m     Wt Readings from Last 3 Encounters:  01/03/21 179 lb (81.2 kg)  12/29/20 179 lb (81.2 kg)  10/16/20 175 lb (79.4 kg)     GEN:  Well nourished, well developed in no acute distress HEENT: Normal NECK: No JVD; No carotid bruits LYMPHATICS: No lymphadenopathy CARDIAC: RRR, no murmurs, rubs, gallops RESPIRATORY:  Clear to auscultation without rales, wheezing or rhonchi  ABDOMEN: Soft, non-tender, non-distended MUSCULOSKELETAL:  No edema; No deformity  SKIN: Warm and dry NEUROLOGIC:  Alert and oriented x 3 PSYCHIATRIC:  Normal affect   ASSESSMENT:    1. Palpitations   2. Paroxysmal atrial fibrillation (HCC)   3. Mitral valve disorder   4. Chest pain of uncertain etiology   5. Essential hypertension    PLAN:    In order of problems listed above:  Palpitations Remote Afib Patient reports history of afib however there is no documented EKG. She was not  started on a/c at the time and started on metoprolol for rate control. Over the last month palpitations have been worse, which possibly might related to increased stress at home. TSH in August was normal. Labs in the ER visit were unremarkable. EKG shows SR. I will order 2 week heart monitor.   Atypical chest pain Has been intermittent over the last year, but more frequent over the last month, also possibly stress related. Chest pain sounds more atypical in nature and pain is unrelated to palpitations. In 2019 the patient had a cardiac scoring CT showing calcium score of 3, 49th percentile for age and sex, which is reassuring. I will check an echo. PCP previously stopped Aspirin.   HTN This was severely elevated in the ER but seemed to improve with Ativan and IVF. At home Bps have been good, 120/70s. Continue Losartan and Toprol-XL. Continue with daily blood pressures.  Mitral valve disease Echo in 2009 with mild to moderate MR. No significant murmur on exam. No orthopnea or LLE. Echo as above  Disposition: Follow up in 1 month(s) with APP    Signed, Devonda Pequignot David Stall, PA-C  01/03/2021 1:28 PM    [facility]

## 2021-01-03 ENCOUNTER — Other Ambulatory Visit: Payer: Self-pay

## 2021-01-03 ENCOUNTER — Encounter: Payer: Self-pay | Admitting: Medical

## 2021-01-03 ENCOUNTER — Ambulatory Visit (INDEPENDENT_AMBULATORY_CARE_PROVIDER_SITE_OTHER): Payer: Medicare Other | Admitting: Medical

## 2021-01-03 ENCOUNTER — Ambulatory Visit (INDEPENDENT_AMBULATORY_CARE_PROVIDER_SITE_OTHER): Payer: Medicare Other

## 2021-01-03 VITALS — BP 130/80 | HR 71 | Ht 65.5 in | Wt 179.0 lb

## 2021-01-03 DIAGNOSIS — R002 Palpitations: Secondary | ICD-10-CM

## 2021-01-03 DIAGNOSIS — I059 Rheumatic mitral valve disease, unspecified: Secondary | ICD-10-CM

## 2021-01-03 DIAGNOSIS — I480 Paroxysmal atrial fibrillation: Secondary | ICD-10-CM | POA: Diagnosis not present

## 2021-01-03 DIAGNOSIS — I10 Essential (primary) hypertension: Secondary | ICD-10-CM | POA: Diagnosis not present

## 2021-01-03 DIAGNOSIS — R079 Chest pain, unspecified: Secondary | ICD-10-CM

## 2021-01-03 NOTE — Patient Instructions (Signed)
Medication Instructions:  No changes  *If you need a refill on your cardiac medications before your next appointment, please call your pharmacy*   Lab Work: None   If you have labs (blood work) drawn today and your tests are completely normal, you will receive your results only by: [name] MyChart Message (if you have MyChart) OR . A paper copy in the mail If you have any lab test that is abnormal or we need to change your treatment, we will call you to review the results.   Testing/Procedures: Your physician has requested that you have an echocardiogram. Echocardiography is a painless test that uses sound waves to create images of your heart. It provides your doctor with information about the size and shape of your heart and how well your heart's chambers and valves are working. This procedure takes approximately one hour. There are no restrictions for this procedure.  Your physician has recommended that you wear a Zio monitor.  Wear from 01/03/21 and remove 01/17/21 and then remove and mail back in box provided.   This monitor is a medical device that records the heart's electrical activity. Doctors most often use these monitors to diagnose arrhythmias. Arrhythmias are problems with the speed or rhythm of the heartbeat. The monitor is a small device applied to your chest. You can wear one while you do your normal daily activities. While wearing this monitor if you have any symptoms to push the button and record what you felt. Once you have worn this monitor for the period of time provider prescribed (Usually 14 days), you will return the monitor device in the postage paid box. Once it is returned they will download the data collected and provide [place] with a report which the provider will then review and we will call you with those results. Important tips:  1. Avoid showering during the first 24 hours of wearing the monitor. 2. Avoid excessive sweating to help maximize wear time. 3. Do not submerge  the device, no hot tubs, and no swimming pools. 4. Keep any lotions or oils away from the patch. 5. After 24 hours you may shower with the patch on. Take brief showers with your back facing the shower head.  6. Do not remove patch once it has been placed because that will interrupt data and decrease adhesive wear time. 7. Push the button when you have any symptoms and write down what you were feeling. 8. Once you have completed wearing your monitor, remove and place into box which has postage paid and place in your outgoing mailbox.  9. If for some reason you have misplaced your box then call our office and we can provide another box and/or mail it off for you.         Follow-Up: At [facility], you and your health needs are our priority.  As part of our continuing mission to provide you with exceptional heart care, we have created designated Provider Care Teams.  These Care Teams include your primary Cardiologist (physician) and Advanced Practice Providers (APPs -  Physician Assistants and Nurse Practitioners) who all work together to provide you with the care you need, when you need it.  Your next appointment:   1 month(s)  The format for your next appointment:   In Person  Provider:   You may see Dr. Ida Rogue or one of the following Advanced Practice Providers on your designated Care Team:    Murray Hodgkins, NP  Christell Faith, PA-C  Marrianne Mood,  PA-C  Cadence Kathlen Mody, PA-C  Laurann Montana, NP

## 2021-01-07 ENCOUNTER — Ambulatory Visit (INDEPENDENT_AMBULATORY_CARE_PROVIDER_SITE_OTHER): Payer: Medicare Other | Admitting: Pulmonary Disease

## 2021-01-07 ENCOUNTER — Other Ambulatory Visit: Payer: Self-pay

## 2021-01-07 ENCOUNTER — Encounter: Payer: Self-pay | Admitting: Pulmonary Disease

## 2021-01-07 VITALS — BP 128/76 | HR 74 | Temp 97.4°F | Ht 65.0 in | Wt 180.0 lb

## 2021-01-07 DIAGNOSIS — Z9989 Dependence on other enabling machines and devices: Secondary | ICD-10-CM | POA: Diagnosis not present

## 2021-01-07 DIAGNOSIS — J45909 Unspecified asthma, uncomplicated: Secondary | ICD-10-CM | POA: Diagnosis not present

## 2021-01-07 DIAGNOSIS — R058 Other specified cough: Secondary | ICD-10-CM | POA: Diagnosis not present

## 2021-01-07 DIAGNOSIS — J302 Other seasonal allergic rhinitis: Secondary | ICD-10-CM | POA: Diagnosis not present

## 2021-01-07 DIAGNOSIS — J3089 Other allergic rhinitis: Secondary | ICD-10-CM

## 2021-01-07 DIAGNOSIS — G4733 Obstructive sleep apnea (adult) (pediatric): Secondary | ICD-10-CM | POA: Diagnosis not present

## 2021-01-07 MED ORDER — FLONASE SENSIMIST 27.5 MCG/SPRAY NA SUSP: 1.0000 | Freq: Every day | NASAL | 3 refills | Status: DC

## 2021-01-07 MED ORDER — MONTELUKAST SODIUM 10 MG PO TABS: 10.0000 mg | ORAL_TABLET | Freq: Every day | ORAL | 3 refills | Status: DC

## 2021-01-07 MED ORDER — QVAR REDIHALER 80 MCG/ACT IN AERB: 2.0000 | INHALATION_SPRAY | Freq: Two times a day (BID) | RESPIRATORY_TRACT | 3 refills | Status: DC

## 2021-01-07 MED ORDER — AZELASTINE HCL 0.1 % NA SOLN: 1.0000 | Freq: Two times a day (BID) | NASAL | 3 refills | Status: DC

## 2021-01-07 NOTE — Patient Instructions (Addendum)
Azelastine one spray in each nostril twice per day  Flonase sensimist 1 spray in each nostril daily  Stop using dymista  Follow up in 6 months

## 2021-01-07 NOTE — Progress Notes (Signed)
[facility]  Chief Complaint  Patient presents with  . Follow-up    Patient feels like she finds herself a little more out of breath over the past year    Constitutional:  BP 128/76 (BP Location: Right Arm, Cuff Size: Normal)   Pulse 74   Temp (!) 97.4 F (36.3 C) (Temporal)   Ht 5\' 5"  (1.651 m)   Wt 180 lb (81.6 kg)   SpO2 99% Comment: Room air  BMI 29.95 kg/m   Past Medical History:  Vit D deficiency, Vertigo, Rosacea, Hypothyroidism, HTN, HLD, GERD, Diverticulosis, Depression, HA  Past Surgical History:  She  has a past surgical history that includes Cholecystectomy (2007); Appendectomy (2007); Tubal ligation; Total abdominal hysterectomy (1991); Nasal sinus surgery; Breast enhancement surgery (2006); Cardiovascular stress test (2013); Colonoscopy (08/2012); Esophagogastroduodenoscopy (08/2012); US ECHOCARDIOGRAPHY (09/2009); US ECHOCARDIOGRAPHY (11/2007); sleep study (08/2007); Rectocele repair (12/2008); Colonoscopy (2006); Breast biopsy (Left, 09/27/2009); and Hernia repair.  Brief Summary:  Angela Mendoza is a 70 y.o. female former smoker with OSA and asthma.      Subjective:   She was seen in ER earlier this month for dizziness and chest pain.  CXR showed biapical scarring.  She was seen by cardiology and has Echo set up.  Using CPAP nightly.  No issues with mask fit.  Breathing okay.  Not having cough, wheeze, or sputum.  She thinks episodes of thrush are coming from dysmista and post nasal drainage with lingering effect of nasal steroid.    Physical Exam:   Appearance - well kempt   ENMT - no sinus tenderness, no oral exudate, no LAN, Mallampati 3 airway, no stridor  Respiratory - equal breath sounds bilaterally, no wheezing or rales  CV - s1s2 regular rate and rhythm, no murmurs  Ext - no clubbing, no edema  Skin - no rashes  Psych - normal mood and affect   Pulmonary testing:   Spirometry 12/12 >> FEV1%  68  RAST 11/15/14 >> IgE 30, cats/mold  PFT 02/07/16 >> FEV1 2.32 (92%), FEV1% 76, FEF 25-75% 1.56 (71%), TLC 4.25 (81%), DLCO 121, borderline BD from FEF 25-75  FeNO 12/01/18 >> 7  Chest Imaging:    Sleep Tests:   PSG 09/06/07 >> AHI 19  CPAP 12/05/20 to 01/03/21 >> used on 30 of 30 nights with average 8 hrs 15 min.  Average AHI 0.6 with CPAP 11 cm H2O  Cardiac Tests:    Social History:  She  reports that she quit smoking about 43 years ago. Her smoking use included cigarettes. She has a 2.10 pack-year smoking history. She has never used smokeless tobacco. She reports previous alcohol use. She reports that she does not use drugs.  Family History:  Her family history includes AAA (abdominal aortic aneurysm) in her paternal uncle; Aortic dissection in her maternal aunt and paternal uncle; Breast cancer in her maternal aunt; Cancer in her cousin and paternal aunt; Cancer (age of onset: 54) in her father; Cancer (age of onset: 66) in her mother; Heart attack in her paternal uncle; Heart disease in her paternal uncle; Heart failure in her sister; Hypertension in her sister; Stroke in her paternal uncle and sister.     Assessment/Plan:   Allergic asthma. - continue Qvar and prn albuterol with spacer device - continue singulair - gets her scripts through [facility]. - has recurrent episodes - responds best to fluconazole regimen (200 mg on day 1, then 100 mg  daily for next 6 days) - she reports compliance with oral hygiene after using Qvar - concern that nasal steroid in dymista is contributing to this  Upper airway cough with allergic rhinitis. - will change dymista to flonase sensimist and azelastine - continue singulair - prn allegra, nasal irrigation  Obstructive sleep apnea. - she is compliant with CPAP and reports benefit from therapy - uses [employer] for her DME - continue CPAP 11 cm H2O  Time Spent Involved in Patient Care on Day of Examination:  33  minutes  Follow up:  Patient Instructions  Azelastine one spray in each nostril twice per day  Flonase sensimist 1 spray in each nostril daily  Stop using dymista  Follow up in 6 months   Medication List:   Allergies as of 01/07/2021      Reactions   Codeine    Low blood pressure/nausea   Sulfonamide Derivatives    rash   Estradiol Rash   Generic estradiol patch d/t the adhesive      Medication List       Accurate as of January 07, 2021  5:27 PM. If you have any questions, ask your nurse or doctor.        STOP taking these medications   Azelastine-Fluticasone 137-50 MCG/ACT Susp Commonly known as: Dymista Stopped by: Chesley Mires, MD     TAKE these medications   AeroChamber MV inhaler Use as instructed   albuterol 108 (90 Base) MCG/ACT inhaler Commonly known as: Proventil HFA Inhale 2 puffs into the lungs every 6 (six) hours as needed.   ALPRAZolam 0.25 MG tablet Commonly known as: XANAX TAKE 1 TABLET (0.25 MG TOTAL) BY MOUTH ONCE DAILY AS NEEDED FOR SLEEP.   atorvastatin 40 MG tablet Commonly known as: LIPITOR Take 1 tablet (40 mg total) by mouth daily.   azelastine 0.1 % nasal spray Commonly known as: ASTELIN Place 1 spray into both nostrils 2 (two) times daily. Use in each nostril as directed Started by: Chesley Mires, MD   Coenzyme Q10 300 MG Caps Take 1 capsule by mouth daily.   conjugated estrogens vaginal cream Commonly known as: PREMARIN Place 1 Applicatorful vaginally 2 (two) times a week. 90 day supply,  Refill for one year   cyanocobalamin 1000 MCG/ML injection Commonly known as: (VITAMIN B-12) Inject 1 mL (1,000 mcg total) into the muscle every 30 (thirty) days.   cycloSPORINE 0.05 % ophthalmic emulsion Commonly known as: RESTASIS Place 1 drop into both eyes 2 (two) times daily.   diazepam 5 MG tablet Commonly known as: VALIUM TAKE 1 TABLET BY MOUTH EVERY 6 HOURS AS NEEDED FOR VERTIGO   EPINEPHrine 0.3 mg/0.3 mL Soaj  injection Commonly known as: EPI-PEN Inject 0.3 mLs (0.3 mg total) into the muscle as needed for anaphylaxis.   estradiol 0.1 MG/24HR patch Commonly known as: VIVELLE-DOT Place 1 patch (0.1 mg total) onto the skin 2 (two) times a week.   famotidine 20 MG tablet Commonly known as: PEPCID Take 1 tablet (20 mg total) by mouth daily as needed for heartburn or indigestion.   fexofenadine 180 MG tablet Commonly known as: Allegra Allergy Take 1 tablet (180 mg total) by mouth daily.   Flonase Sensimist 27.5 MCG/SPRAY nasal spray Generic drug: fluticasone Place 1 spray into the nose daily. Started by: Chesley Mires, MD   levothyroxine 50 MCG tablet Commonly known as: Synthroid Take 1 tablet (50 mcg total) by mouth daily before breakfast.   losartan 50 MG tablet Commonly known as:  COZAAR Take 1 tablet (50 mg total) by mouth at bedtime.   MAGNESIUM PO Take 250 mg by mouth daily.   meclizine 25 MG tablet Commonly known as: ANTIVERT Take 1 tablet (25 mg total) by mouth 3 (three) times daily as needed.   metoprolol succinate 50 MG 24 hr tablet Commonly known as: TOPROL-XL Take 1 tablet (50 mg total) by mouth daily.   montelukast 10 MG tablet Commonly known as: SINGULAIR Take 1 tablet (10 mg total) by mouth at bedtime.   multivitamin tablet Take 1 tablet by mouth daily.   omega-3 acid ethyl esters 1 g capsule Commonly known as: LOVAZA Take 1 capsule (1 g total) by mouth daily.   ondansetron 4 MG disintegrating tablet Commonly known as: Zofran ODT Take 1 tablet (4 mg total) by mouth every 8 (eight) hours as needed for nausea or vomiting.   Propylene Glycol 0.6 % Soln Apply 1 drop to eye as needed.   psyllium 58.6 % powder Commonly known as: METAMUCIL Take 1 packet by mouth as needed.   Qvar RediHaler 80 MCG/ACT inhaler Generic drug: beclomethasone Inhale 2 puffs into the lungs 2 (two) times daily.   STOOL SOFTENER PO Take 1 tablet by mouth as needed.   SYRINGE  3CC/20GX1" 20G X 1" 3 ML Misc 1 Syringe by Does not apply route every 30 (thirty) days. For use with b-12 injection monthly   tretinoin 0.05 % cream Commonly known as: RETIN-A Apply topically at bedtime.   urea 10 % cream Commonly known as: CARMOL Apply topically as needed.   Vitamin D3 50 MCG (2000 UT) Tabs Take 1 tablet by mouth daily.       Signature:  Chesley Mires, MD [place] Pager - [phone] 01/07/2021, 5:27 PM

## 2021-01-09 ENCOUNTER — Telehealth: Payer: Self-pay | Admitting: Medical

## 2021-01-09 NOTE — Telephone Encounter (Signed)
Called to see if patient had brought in list of blood pressures to her appointment and wanted to confirm if the list I have was from here. She provided a couple of readings and they did not match those we had copy for. Advised that I appreciate her time and review of her list. She had no further questions at this time.

## 2021-01-16 ENCOUNTER — Encounter: Payer: Self-pay | Admitting: Internal Medicine

## 2021-01-16 ENCOUNTER — Other Ambulatory Visit: Payer: Self-pay

## 2021-01-16 ENCOUNTER — Ambulatory Visit: Payer: Medicare Other | Admitting: Internal Medicine

## 2021-01-16 ENCOUNTER — Ambulatory Visit (INDEPENDENT_AMBULATORY_CARE_PROVIDER_SITE_OTHER): Payer: Medicare Other | Admitting: Internal Medicine

## 2021-01-16 VITALS — BP 130/76 | HR 69 | Resp 16 | Ht 65.0 in | Wt 179.2 lb

## 2021-01-16 DIAGNOSIS — I10 Essential (primary) hypertension: Secondary | ICD-10-CM

## 2021-01-16 DIAGNOSIS — F411 Generalized anxiety disorder: Secondary | ICD-10-CM | POA: Diagnosis not present

## 2021-01-16 DIAGNOSIS — R1012 Left upper quadrant pain: Secondary | ICD-10-CM | POA: Diagnosis not present

## 2021-01-16 DIAGNOSIS — Z8249 Family history of ischemic heart disease and other diseases of the circulatory system: Secondary | ICD-10-CM

## 2021-01-16 MED ORDER — CITALOPRAM HYDROBROMIDE 10 MG PO TABS: 5.0000 mg | ORAL_TABLET | Freq: Every day | ORAL | 0 refills | Status: DC

## 2021-01-16 MED ORDER — CITALOPRAM HYDROBROMIDE 10 MG PO TABS: 10.0000 mg | ORAL_TABLET | Freq: Every day | ORAL | 0 refills | Status: DC

## 2021-01-16 NOTE — Progress Notes (Signed)
Subjective:  Patient ID: Angela Mendoza, female    DOB: [birth year]  Age: 70 y.o. MRN: [id number]  CC: The primary encounter diagnosis was Family history of abdominal aortic aneurysm (AAA). Diagnoses of Colicky LUQ abdominal pain, LUQ abdominal pain, Essential hypertension, benign, and GAD (generalized anxiety disorder) were also pertinent to this visit.  HPI Angela Mendoza presents for follow up on multiple issues   This visit occurred during the SARS-CoV-2 public health emergency.  Safety protocols were in place, including screening questions prior to the visit, additional usage of staff PPE, and extensive cleaning of exam room while observing appropriate contact time as indicated for disinfecting solutions.   Palpitations.  She has a remote history of PAF 2005.  She has been wearing Zio Monitor  for 2 weeks  for recurrent palpitations.  Increased anxiety:  Aggravated by the stress of trying to find affordable housing in [place], with both sons moving away.    Planning to follow one son to [facility].  Also concerned about her FH of AAA (uncle was a smoker, aunt was also a smoker)   Has persistent deep grabbing  LUQ pain under the left breast , first noticed the  last summer.  Has become more frequent .  Occurs at rest   BP has been elevated more recently , varies from day to day.  Went to urgent Care who sent her  to ER for elevated BP pain in left ear .  Head CT done  Normal.  Has meniere's  Home BPs have been variable on losartan 50 mg daily and metoprolol 50 mg daily . Has   Ranged from 474 to 259 systolic .  Last ENT exam over 2 years ago.  Starting a new nasal spray by Dr Halford Chessman .  Has been stirring up a lot of dust at home thinks this  may have aggravated it:  More sneezing,  more congestion , no sore throat, body aches or fevers.   OSA under control    Outpatient Medications Prior to Visit  Medication Sig Dispense Refill  . albuterol (PROVENTIL HFA) 108 (90 Base) MCG/ACT inhaler Inhale 2  puffs into the lungs every 6 (six) hours as needed. 18 g 2  . ALPRAZolam (XANAX) 0.25 MG tablet TAKE 1 TABLET (0.25 MG TOTAL) BY MOUTH ONCE DAILY AS NEEDED FOR SLEEP. 30 tablet 5  . atorvastatin (LIPITOR) 40 MG tablet Take 1 tablet (40 mg total) by mouth daily. 90 tablet 3  . azelastine (ASTELIN) 0.1 % nasal spray Place 1 spray into both nostrils 2 (two) times daily. Use in each nostril as directed 30 mL 3  . beclomethasone (QVAR REDIHALER) 80 MCG/ACT inhaler Inhale 2 puffs into the lungs 2 (two) times daily. 31.8 g 3  . Cholecalciferol (VITAMIN D3) 2000 units TABS Take 1 tablet by mouth daily. 90 tablet 3  . Coenzyme Q10 300 MG CAPS Take 1 capsule by mouth daily.    [name] conjugated estrogens (PREMARIN) vaginal cream Place 1 Applicatorful vaginally 2 (two) times a week. 90 day supply,  Refill for one year 90 g 3  . cyanocobalamin (,VITAMIN B-12,) 1000 MCG/ML injection Inject 1 mL (1,000 mcg total) into the muscle every 30 (thirty) days. 3 mL 3  . cycloSPORINE (RESTASIS) 0.05 % ophthalmic emulsion Place 1 drop into both eyes 2 (two) times daily. 3 each 3  . diazepam (VALIUM) 5 MG tablet TAKE 1 TABLET BY MOUTH EVERY 6 HOURS AS NEEDED FOR VERTIGO 30 tablet 2  .  Docusate Calcium (STOOL SOFTENER PO) Take 1 tablet by mouth as needed.    [name] EPINEPHrine 0.3 mg/0.3 mL IJ SOAJ injection Inject 0.3 mLs (0.3 mg total) into the muscle as needed for anaphylaxis. 1 each 1  . estradiol (VIVELLE-DOT) 0.1 MG/24HR patch Place 1 patch (0.1 mg total) onto the skin 2 (two) times a week. 24 patch 3  . famotidine (PEPCID) 20 MG tablet Take 1 tablet (20 mg total) by mouth daily as needed for heartburn or indigestion. 90 tablet 3  . fexofenadine (ALLEGRA ALLERGY) 180 MG tablet Take 1 tablet (180 mg total) by mouth daily. 90 tablet 3  . fluticasone (FLONASE SENSIMIST) 27.5 MCG/SPRAY nasal spray Place 1 spray into the nose daily. 10 g 3  . levothyroxine (SYNTHROID) 50 MCG tablet Take 1 tablet (50 mcg total) by mouth daily before  breakfast. 90 tablet 3  . losartan (COZAAR) 50 MG tablet Take 1 tablet (50 mg total) by mouth at bedtime. 90 tablet 3  . MAGNESIUM PO Take 250 mg by mouth daily.    . meclizine (ANTIVERT) 25 MG tablet Take 1 tablet (25 mg total) by mouth 3 (three) times daily as needed. 270 tablet 3  . metoprolol succinate (TOPROL-XL) 50 MG 24 hr tablet Take 1 tablet (50 mg total) by mouth daily. 90 tablet 3  . montelukast (SINGULAIR) 10 MG tablet Take 1 tablet (10 mg total) by mouth at bedtime. 90 tablet 3  . Multiple Vitamin (MULTIVITAMIN) tablet Take 1 tablet by mouth daily.    [name] omega-3 acid ethyl esters (LOVAZA) 1 g capsule Take 1 capsule (1 g total) by mouth daily. 90 capsule 3  . ondansetron (ZOFRAN ODT) 4 MG disintegrating tablet Take 1 tablet (4 mg total) by mouth every 8 (eight) hours as needed for nausea or vomiting. 30 tablet 2  . Propylene Glycol 0.6 % SOLN Apply 1 drop to eye as needed.    . psyllium (METAMUCIL) 58.6 % powder Take 1 packet by mouth as needed.    [name] Spacer/Aero-Holding Chambers (AEROCHAMBER MV) inhaler Use as instructed 1 each 3  . Syringe/Needle, Disp, (SYRINGE 3CC/20GX1") 20G X 1" 3 ML MISC 1 Syringe by Does not apply route every 30 (thirty) days. For use with b-12 injection monthly 50 each 0  . tretinoin (RETIN-A) 0.05 % cream Apply topically at bedtime. 45 g 1  . urea (CARMOL) 10 % cream Apply topically as needed. 71 g 3   No facility-administered medications prior to visit.    Review of Systems;  Patient denies headache, fevers, malaise, unintentional weight loss, skin rash, eye pain, sinus congestion and sinus pain, sore throat, dysphagia,  hemoptysis , cough, dyspnea, wheezing, chest pain, palpitations, orthopnea, edema, abdominal pain, nausea, melena, diarrhea, constipation, flank pain, dysuria, hematuria, urinary  Frequency, nocturia, numbness, tingling, seizures,  Focal weakness, Loss of consciousness,  Tremor, insomnia, depression, anxiety, and suicidal ideation.       Objective:  BP 130/76 (BP Location: Left Arm, Patient Position: Sitting, Cuff Size: Large)   Pulse 69   Resp 16   Ht 5\' 5"  (1.651 m)   Wt 179 lb 3.2 oz (81.3 kg)   SpO2 99%   BMI 29.82 kg/m   BP Readings from Last 3 Encounters:  01/16/21 130/76  01/07/21 128/76  01/03/21 130/80    Wt Readings from Last 3 Encounters:  01/16/21 179 lb 3.2 oz (81.3 kg)  01/07/21 180 lb (81.6 kg)  01/03/21 179 lb (81.2 kg)    General appearance: alert,  cooperative and appears stated age Ears: normal TM's and external ear canals both ears Throat: lips, mucosa, and tongue normal; teeth and gums normal Neck: no adenopathy, no carotid bruit, supple, symmetrical, trachea midline and thyroid not enlarged, symmetric, no tenderness/mass/nodules Back: symmetric, no curvature. ROM normal. No CVA tenderness. Lungs: clear to auscultation bilaterally Heart: regular rate and rhythm, S1, S2 normal, no murmur, click, rub or gallop Abdomen: soft, non-tender; bowel sounds normal; no masses,  no organomegaly Pulses: 2+ and symmetric Skin: Skin color, texture, turgor normal. No rashes or lesions Lymph nodes: Cervical, supraclavicular, and axillary nodes normal.  Lab Results  Component Value Date   HGBA1C 5.9 10/12/2015    Lab Results  Component Value Date   CREATININE 0.78 12/29/2020   CREATININE 0.82 05/28/2020   CREATININE 0.81 12/08/2019    Lab Results  Component Value Date   WBC 8.1 12/29/2020   HGB 13.6 12/29/2020   HCT 40.6 12/29/2020   PLT 298 12/29/2020   GLUCOSE 101 (H) 12/29/2020   CHOL 182 05/28/2020   TRIG 220.0 (H) 05/28/2020   HDL 62.50 05/28/2020   LDLDIRECT 83.0 05/28/2020   LDLCALC 81 12/08/2019   ALT 22 05/28/2020   AST 20 05/28/2020   NA 134 (L) 12/29/2020   K 3.7 12/29/2020   CL 98 12/29/2020   CREATININE 0.78 12/29/2020   BUN 10 12/29/2020   CO2 25 12/29/2020   TSH 1.35 05/28/2020   HGBA1C 5.9 10/12/2015    DG Chest 2 View  Result Date: 12/29/2020 CLINICAL  DATA:  Chest pain. Patient c/o intermittent chest heaviness for a month with nausea. C/o pain in bilateral ear with left being worse, dizziness, pain on left side of head, and numbness to left hand off and on EXAM: CHEST - 2 VIEW COMPARISON:  Chest x-ray 01/08/2018, CT cardiac 07/12/2018. FINDINGS: The heart size and mediastinal contours are within normal limits. Biapical pleural/pulmonary scarring. No focal consolidation. No pulmonary edema. No pleural effusion. No pneumothorax. No acute osseous abnormality. Right upper quadrant abdominal surgical clips. IMPRESSION: No active cardiopulmonary disease. Electronically Signed   By: Iven Finn M.D.   On: 12/29/2020 15:11   CT Head Wo Contrast  Result Date: 12/29/2020 CLINICAL DATA:  Dizziness. intermittent chest heaviness for a month with nausea. C/o pain in bilateral ear with left being worse, dizziness, pain on left side of head, and numbness to left hand off and on. Axo x3 EXAM: CT HEAD WITHOUT CONTRAST TECHNIQUE: Contiguous axial images were obtained from the base of the skull through the vertex without intravenous contrast. COMPARISON:  CT head 05/14/2017 FINDINGS: Brain: No evidence of large-territorial acute infarction. No parenchymal hemorrhage. No mass lesion. No extra-axial collection. No mass effect or midline shift. No hydrocephalus. Basilar cisterns are patent. Vascular: No hyperdense vessel. Skull: No acute fracture or focal lesion. Sinuses/Orbits: Paranasal sinuses and mastoid air cells are clear. The orbits are unremarkable. Other: None. IMPRESSION: No acute intracranial abnormality. Electronically Signed   By: Iven Finn M.D.   On: 12/29/2020 19:41    Assessment & Plan:   Problem List Items Addressed This Visit      Unprioritized   GAD (generalized anxiety disorder)    starting Celexa for management of anxiety       Relevant Medications   citalopram (CELEXA) 10 MG tablet   Essential hypertension, benign    Readings reviewed.   No changes to therapy except treating anxiety       LUQ abdominal pain    She  is concerned about aortic aneurysm .  Aortic ultrasound ordered.        Other Visit Diagnoses    Family history of abdominal aortic aneurysm (AAA)    -  Primary   Relevant Orders   VAS [place] AAA DUPLEX   Colicky LUQ abdominal pain       Relevant Orders   VAS [place] AAA DUPLEX      I have changed Angela Mendoza citalopram. I am also having her maintain her Propylene Glycol, psyllium, Docusate Calcium (STOOL SOFTENER PO), fexofenadine, AeroChamber MV, Vitamin D3, multivitamin, MAGNESIUM PO, Coenzyme Q10, diazepam, SYRINGE 3CC/20GX1", omega-3 acid ethyl esters, albuterol, atorvastatin, cyanocobalamin, cycloSPORINE, EPINEPHrine, estradiol, conjugated estrogens, famotidine, levothyroxine, losartan, meclizine, metoprolol succinate, ondansetron, tretinoin, urea, ALPRAZolam, Qvar RediHaler, montelukast, azelastine, and Flonase Sensimist.  Meds ordered this encounter  Medications  . DISCONTD: citalopram (CELEXA) 10 MG tablet    Sig: Take 1 tablet (10 mg total) by mouth daily.    Dispense:  90 tablet    Refill:  0  . citalopram (CELEXA) 10 MG tablet    Sig: Take 0.5 tablets (5 mg total) by mouth daily.    Dispense:  5 tablet    Refill:  0    Medications Discontinued During This Encounter  Medication Reason  . citalopram (CELEXA) 10 MG tablet Reorder    Follow-up: Return in about 4 weeks (around 02/13/2021).   Crecencio Mc, MD

## 2021-01-16 NOTE — Patient Instructions (Addendum)
I recommend starting Celexa for your anxiety, starting with 1/2 tablet daily with a meal  This is a safe drug,  After 4-5 days  If you are tolerating  It ,  Change to full tablet    Abdominal ultrasound has been ordered.  You will be called

## 2021-01-17 DIAGNOSIS — R1012 Left upper quadrant pain: Secondary | ICD-10-CM | POA: Insufficient documentation

## 2021-01-17 NOTE — Assessment & Plan Note (Signed)
Readings reviewed.  No changes to therapy except treating anxiety

## 2021-01-17 NOTE — Assessment & Plan Note (Signed)
She is concerned about aortic aneurysm .  Aortic ultrasound ordered.

## 2021-01-17 NOTE — Assessment & Plan Note (Signed)
starting Celexa for management of anxiety

## 2021-01-22 DIAGNOSIS — F411 Generalized anxiety disorder: Secondary | ICD-10-CM

## 2021-01-23 ENCOUNTER — Other Ambulatory Visit: Payer: Self-pay

## 2021-01-23 ENCOUNTER — Ambulatory Visit (INDEPENDENT_AMBULATORY_CARE_PROVIDER_SITE_OTHER): Payer: Medicare Other

## 2021-01-23 DIAGNOSIS — I480 Paroxysmal atrial fibrillation: Secondary | ICD-10-CM

## 2021-01-23 DIAGNOSIS — R002 Palpitations: Secondary | ICD-10-CM | POA: Diagnosis not present

## 2021-01-24 LAB — ECHOCARDIOGRAM COMPLETE
AR max vel: 2.98 cm2
AV Area VTI: 3.01 cm2
AV Area mean vel: 3 cm2
AV Mean grad: 3 mmHg
AV Peak grad: 5.4 mmHg
Ao pk vel: 1.16 m/s
Area-P 1/2: 3.13 cm2
Calc EF: 53.5 %
S' Lateral: 3.3 cm
Single Plane A2C EF: 52.7 %
Single Plane A4C EF: 54.2 %

## 2021-01-25 NOTE — Assessment & Plan Note (Signed)
Stopped citalopram after 2 doses due to nausea

## 2021-01-30 ENCOUNTER — Ambulatory Visit (INDEPENDENT_AMBULATORY_CARE_PROVIDER_SITE_OTHER): Payer: Medicare Other

## 2021-01-30 ENCOUNTER — Other Ambulatory Visit: Payer: Self-pay

## 2021-01-30 DIAGNOSIS — R1012 Left upper quadrant pain: Secondary | ICD-10-CM

## 2021-01-30 DIAGNOSIS — Z8249 Family history of ischemic heart disease and other diseases of the circulatory system: Secondary | ICD-10-CM

## 2021-01-30 NOTE — Progress Notes (Signed)
Cardiology Office Note:    Date:  02/01/2021   ID:  Angela Mendoza, DOB 1951-05-24, MRN [id number]  PCP:  Crecencio Mc, MD  [facility] Cardiologist:  Dr. Arvid Right [facility] Electrophysiologist:  None   Referring MD: Crecencio Mc, MD   Chief Complaint: Follow-up echo and heart monitor  History of Present Illness:     Angela Mendoza is a 70 y.o. female with a hx of remote A. fib, palpitations, anxiety, OSA on CPAP, asthma, hypertension, mild to moderate MR by echo 2009, Mnire's disease who is being seen for follow-up.  A. fib was diagnosed in 2005 in [place] with [name], although there is no documented A. fib in the chart.  Patient reported palpitations.  She was not started on anticoagulation at that time.  She was started on metoprolol for rate control.  She has a family history of aortic abdominal dissection.  Patient saw Dr. Rockey Situ in 2019 for palpitations/A. fib.  She was on only aspirin and metoprolol.  History of swelling on amlodipine.  EKG at that time showed sinus rhythm.  Echo from 2010 showed normal EF 60%, impaired relaxation, mild TR, normal mitral valve.  Stress test 2013 was normal with EF 55 to 60%.  Cardiac scoring per CT was 3, 49th percentile for age and sex.  Patient was seen in the ER 12/29/2020 for chest pain.  Blood pressure was severely elevated.  Troponin was normal and EKG showed no ischemic changes.  She was given IV fluids for dizziness.  CT of the head was negative she was given Ativan which seemed to calm the patient down and improve the blood pressure.  She was seen in cardiology follow-up 01/03/2021.  Blood pressure was well controlled, although still reported dizziness.  Echo and heart monitor were ordered.  Today, echo and heart monitor reviewed in detail. Patient says she is under a lot of stress. Husband is mentally declining and unable to help with tasks, also he can be abrasive. Patient has a lot to deal with at home. Also trying to  eventually move. PCP gave citalopram and she felt horrible, nauseous and loopy. She has tried other antidepressents but have not seemed to help. Prozac had helped int he past, but PCP ?not wanting to prescribe this. She was given Xanax to take as needed and is taking it 1 pill monthly. Still feels stressed at times. Handling stress with prayer/religion, talking with family members. Also she is not sleeping well since husband has insomnia and awake throughout the night and therefore patient is not sleeping well. Now they are sleeping separately.She stopped all caffeine which has improved palpitations and anxiety.   She has not had recurrent dizziness. She has her "normal" vertigo in the AM and takes meclizine.   BP at home is generally 120-130s/70-80s. When she is stressed sbp goes up to 140s. Today it's 140/80, however she had a stressful morning with spouse. PCP ordered abd [place] which showed largeset measurement 2.1cm and no aneurysm.  She says chest pain/sob is better but still having occasional sharp chest pain, worse in the last 2 days. Also has DOE. Chest pain seemed to be associated with palpitations and anxiety. She reported prior treadmill stress tests in the past and was unable to complete them due to severe asthma. Myoview stress test was discussed but with asthma patient would prefer cardiac CT.   Past Medical History:  Diagnosis Date  . Agatston coronary artery calcium score less than 100  a. 06/2018 Cardiac CT: Ca2+ = 3 (49th %'ile).  . Allergic rhinitis 06/09/2008   chronic  . Aortic atherosclerosis ([place])    a. 06/2018 noted on chest CT.  [name] Asthma, intrinsic 10/06/2011   controlled on qvar. [name] 09/2011:  Very mild airflow obstruction (FEV1% 68, FVL c/w obstruction)   . Chronic headache 06/09/2008  . COPD (chronic obstructive pulmonary disease) ([place])   . Depression   . Diverticulosis 2013   h/o diverticulitis  . Dry eyes   . GERD (gastroesophageal reflux disease)   . History of  breast implant removal    silicone mastitis - R axilla silicone LN, free silicone L breast upper outer quadrant  . Hot flashes   . HYPERLIPIDEMIA 06/09/2008  . HYPERTENSION 06/09/2008  . Hypothyroidism   . MVP (mitral valve prolapse)   . OSA on CPAP    [name]  . PAF (paroxysmal atrial fibrillation) ([place]) 06/09/2008   a. CHA2DS2VASc = 3-->prefers ASA.  [name] Pernicious anemia    per prior pcp  . Rosacea   . Surgical menopause 1991  . Vertigo   . Vitamin D deficiency     Past Surgical History:  Procedure Laterality Date  . APPENDECTOMY  2007  . BREAST BIOPSY Left 09/27/2009  . BREAST ENHANCEMENT SURGERY  2006  . CARDIOVASCULAR STRESS TEST  2013   [place] [name] - WNL, EF 55-60%, with 0 calcium score  . CHOLECYSTECTOMY  2007   biliary dyskinesia  . COLONOSCOPY  08/2012   diverticulosis  . COLONOSCOPY  2006   inflamed hyperplastic polyp  . ESOPHAGOGASTRODUODENOSCOPY  08/2012   esophageal reflux  . HERNIA REPAIR    . NASAL SINUS SURGERY    . RECTOCELE REPAIR  12/2008   [name]  . sleep study  08/2007   OSA, 12 cm H2O,  . TOTAL ABDOMINAL HYSTERECTOMY  1991   heavy bleeding, ovaries removed  . TUBAL LIGATION    . US ECHOCARDIOGRAPHY  09/2009   impaired relaxation, mild tric regurg, normal MV, EF 60%  . US ECHOCARDIOGRAPHY  11/2007   mild-mod MR    Current Medications: No outpatient medications have been marked as taking for the 02/01/21 encounter (Appointment) with Kathlen Mody, Journe Hallmark H, PA-C.     Allergies:   Codeine, Sulfonamide derivatives, and Estradiol   Social History   Socioeconomic History  . Marital status: Married    Spouse name: Not on file  . Number of children: Not on file  . Years of education: Not on file  . Highest education level: Not on file  Occupational History  . Occupation: retired  Tobacco Use  . Smoking status: Former Smoker    Packs/day: 0.30    Years: 7.00    Pack years: 2.10    Types: Cigarettes    Quit date: 10/27/1977    Years since  quitting: 43.2  . Smokeless tobacco: Never Used  . 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 Hx of children who are grown  Vaping Use  . Vaping Use: Never used  Substance and Sexual Activity  . Alcohol use: Not Currently    Alcohol/week: 0.0 standard drinks  . Drug use: No  . Sexual activity: Not on file  Other Topics Concern  . Not on file  Social History Narrative   Lives with husband, cat   Grown children   Occ: retired, was Gaffer   Edu: trade degree then 2 yrscollege   Social Determinants of Engineer, drilling  Resource Strain: Not on file  Food Insecurity: No Food Insecurity  . Worried About Charity fundraiser in the Last Year: Never true  . Ran Out of Food in the Last Year: Never true  Transportation Needs: No Transportation Needs  . Lack of Transportation (Medical): No  . Lack of Transportation (Non-Medical): No  Physical Activity: Not on file  Stress: Not on file  Social Connections: Unknown  . Frequency of Communication with Friends and Family: More than three times a week  . Frequency of Social Gatherings with Friends and Family: Not on file  . Attends Religious Services: Not on file  . Active Member of Clubs or Organizations: Not on file  . Attends Archivist Meetings: Not on file  . Marital Status: Married     Family History: The patient's*family history includes AAA (abdominal aortic aneurysm) in her paternal uncle; Aortic dissection in her maternal aunt and paternal uncle; Breast cancer in her maternal aunt; Cancer in her cousin and paternal aunt; Cancer (age of onset: 24) in her father; Cancer (age of onset: 8) in her mother; Heart attack in her paternal uncle; Heart disease in her paternal uncle; Heart failure in her sister; Hypertension in her sister; Stroke in her paternal uncle and sister.  ROS:   Please see the history of present illness.    All other systems reviewed and are negative.  EKGs/Labs/Other  Studies Reviewed:    The following studies were reviewed today:  Heart monitor 01/27/21  Study Highlights  Event Monitor  Patch Wear Time:  13 days and 23 hours   Predominant underlying rhythm was Sinus Rhythm. Patient had a min HR of 50 bpm, max HR of 127 bpm, and avg HR of 71 bpm.   Isolated SVEs  were rare (<1.0%), SVE Couplets were rare (<1.0%), and no SVE Triplets were present. Isolated VEs were rare (<1.0%), VE Couplets were rare (<1.0%), and no VE Triplets were present.   Most patient triggered events (107)  associated with normal sinus, no arrhythmia.  Signed, Esmond Plants, MD, Ph.D [facility]  Echo 01/23/21  1. Left ventricular ejection fraction, by estimation, is 60 to 65%. The  left ventricle has normal function. The left ventricle has no regional  wall motion abnormalities. Left ventricular diastolic parameters are  consistent with Grade II diastolic  dysfunction (pseudonormalization).  2. Right ventricular systolic function is normal. The right ventricular  size is normal. There is mildly elevated pulmonary artery systolic  pressure. The estimated right ventricular systolic pressure is 96.2 mmHg.  3. The mitral valve is normal in structure. Mild mitral valve  regurgitation.   Cardiac CT scoring 06/2018 FINDINGS: Non-cardiac: See separate report from [facility].  Ascending Aorta: Normal diameter 3.5 cm with atherosclerosis  Pericardium: Normal  Coronary arteries: Mild punctate calcium noted in proximal RCA and LAD IMPRESSION: Coronary calcium score of 3. This was 41 th percentile for age and sex matched control.  [place]  EKG:  EKG is not ordered today.   Recent Labs: 05/28/2020: ALT 22; TSH 1.35 12/29/2020: BUN 10; Creatinine, Ser 0.78; Hemoglobin 13.6; Platelets 298; Potassium 3.7; Sodium 134  Recent Lipid Panel    Component Value Date/Time   CHOL 182 05/28/2020 1606   TRIG 220.0 (H) 05/28/2020 1606   TRIG 199 04/12/2013  0000   HDL 62.50 05/28/2020 1606   CHOLHDL 3 05/28/2020 1606   VLDL 44.0 (H) 05/28/2020 1606   LDLCALC 81 12/08/2019 0920   LDLCALC 93 04/12/2013 0000  LDLDIRECT 83.0 05/28/2020 1606     Physical Exam:    VS:  There were no vitals taken for this visit.    Wt Readings from Last 3 Encounters:  01/16/21 179 lb 3.2 oz (81.3 kg)  01/07/21 180 lb (81.6 kg)  01/03/21 179 lb (81.2 kg)     GEN Well nourished, well developed in no acute distress HEENT: Normal NECK: No JVD; No carotid bruits LYMPHATICS: No lymphadenopathy CARDIAC: RRR, no murmurs, rubs, gallops RESPIRATORY:  Clear to auscultation without rales, wheezing or rhonchi  ABDOMEN: Soft, non-tender, non-distended MUSCULOSKELETAL:  No edema; No deformity  SKIN: Warm and dry NEUROLOGIC:  Alert and oriented x 3 PSYCHIATRIC:  Normal affect   ASSESSMENT:    1. Essential hypertension   2. Mitral valve disorder   3. Paroxysmal A-fib (HCC)   4. Chest pain of uncertain etiology    PLAN:    In order of problems listed above:  Palpitations ?Remote Afib Heart monitor showed normal rhythm, average heart rate 70s, with no significant arrhythmia, and triggered events NSR. Still no documented Afib. Echo showed normal EF with G2DD and mild MR. Heart rate today in the 70s. She is still having anxiety and palpitations. Anxiety due to very stressfull home situation. Seeing PCP for better anxiety control, now has Xanax to take as needed. She stopped caffnie/tea which has improved palpitations. Discussed anxiety management in detail, recommended possibly see psychiatrist.    Atypical chest pain She is still having atypical sharp chest pain. Overall it has improved but the last 2 days it was much more frequent. Also has SOB on exertion. Symptoms possibly related to anxiety and stress. Cardiac CT 2019 showed calcium score of 3, 49th percentile for age and sex. Echo with normal EF and no WMA. She reported she had multiple treadmill stress tests  in the past and was unable to finish them due to asthma/breathing difficulty. Myoview lexiscan was discussed however patient would prefer cardiac CT. We will get cardiac CT and see her after. Start Aspirin 81mg  daily. Recommended activity as tolerated  HTN BP a little high but she had a stressful morning. Bp at home good 120-130s/70-80s. Continue current medications.   Mitral valve disease Echo showed mild MR. No significant murmur on exam. No heart failure symptoms. Can repeat echo in 1-2 years.   OSA  Reports compliance with CPAP   Disposition: Follow up in 3 month(s) with APP   Signed, Viktor Philipp Arlyss Repress  02/01/2021 8:35 AM    [place]

## 2021-02-01 ENCOUNTER — Other Ambulatory Visit: Payer: Self-pay

## 2021-02-01 ENCOUNTER — Ambulatory Visit (INDEPENDENT_AMBULATORY_CARE_PROVIDER_SITE_OTHER): Payer: Medicare Other | Admitting: Medical

## 2021-02-01 ENCOUNTER — Encounter: Payer: Self-pay | Admitting: Medical

## 2021-02-01 VITALS — BP 140/80 | HR 71 | Ht 65.0 in | Wt 178.0 lb

## 2021-02-01 DIAGNOSIS — R072 Precordial pain: Secondary | ICD-10-CM

## 2021-02-01 DIAGNOSIS — I10 Essential (primary) hypertension: Secondary | ICD-10-CM | POA: Diagnosis not present

## 2021-02-01 DIAGNOSIS — I480 Paroxysmal atrial fibrillation: Secondary | ICD-10-CM

## 2021-02-01 DIAGNOSIS — R931 Abnormal findings on diagnostic imaging of heart and coronary circulation: Secondary | ICD-10-CM | POA: Diagnosis not present

## 2021-02-01 DIAGNOSIS — Z23 Encounter for immunization: Secondary | ICD-10-CM | POA: Diagnosis not present

## 2021-02-01 DIAGNOSIS — I059 Rheumatic mitral valve disease, unspecified: Secondary | ICD-10-CM | POA: Diagnosis not present

## 2021-02-01 DIAGNOSIS — R079 Chest pain, unspecified: Secondary | ICD-10-CM

## 2021-02-01 MED ORDER — METOPROLOL TARTRATE 100 MG PO TABS: ORAL_TABLET | ORAL | 0 refills | Status: DC

## 2021-02-01 NOTE — Patient Instructions (Addendum)
Medication Instructions:  - Your physician has recommended you make the following change in your medication:   1) START Aspirin 81 mg- take 1 tablet by mouth once daily   *If you need a refill on your cardiac medications before your next appointment, please call your pharmacy*   Lab Work: - none ordered  If you have labs (blood work) drawn today and your tests are completely normal, you will receive your results only by: [name] MyChart Message (if you have MyChart) OR . A paper copy in the mail If you have any lab test that is abnormal or we need to change your treatment, we will call you to review the results.   Testing/Procedures: - Your physician has requested that you have cardiac CT. Cardiac computed tomography (CT) is a painless test that uses an x-ray machine to take clear, detailed pictures of your heart.   Your cardiac CT will be scheduled at one of the below locations:   [facility] [address] [place], [place] [address] [phone]  [place] [address] [place], [place] [address] [phone]  If scheduled at [facility], please arrive at the [facility] main entrance (entrance A) of [facility] 30 minutes prior to test start time. Proceed to the [facility] Radiology Department (first floor) to check-in and test prep.  If scheduled at [facility], please arrive 15 mins early for check-in and test prep.  Please follow these instructions carefully (unless otherwise directed):  On the Night Before the Test: . Be sure to Drink plenty of water. . Do not consume any caffeinated/decaffeinated beverages or chocolate 12 hours prior to your test. . Do not take any antihistamines 12 hours prior to your test.   On the Day of the Test: . Drink plenty of water until 1 hour prior to the test. . Do not eat any food 4 hours prior to the test. . You may take your  regular medications prior to the test.  . Take metoprolol (Lopressor) 100 mg two hours prior to test. . FEMALES- please wear underwire-free bra if available       After the Test: . Drink plenty of water. . After receiving IV contrast, you may experience a mild flushed feeling. This is normal. . On occasion, you may experience a mild rash up to 24 hours after the test. This is not dangerous. If this occurs, you can take Benadryl 25 mg and increase your fluid intake. . If you experience trouble breathing, this can be serious. If it is severe call 911 IMMEDIATELY. If it is mild, please call our office.   Once we have confirmed authorization from your insurance company, we will call you to set up a date and time for your test. Based on how quickly your insurance processes prior authorizations requests, please allow up to 4 weeks to be contacted for scheduling your Cardiac CT appointment. Be advised that routine Cardiac CT appointments could be scheduled as many as 8 weeks after your provider has ordered it.  For non-scheduling related questions, please contact the cardiac imaging nurse navigator should you have any questions/concerns: Marchia Bond, Cardiac Imaging Nurse Navigator Gordy Clement, Cardiac Imaging Nurse Navigator [facility] Direct Office Dial: [phone]   For scheduling needs, including cancellations and rescheduling, please call [place], [phone].    Follow-Up: At [facility], you and your health needs are our priority.  As  part of our continuing mission to provide you with exceptional heart care, we have created designated Provider Care Teams.  These Care Teams include your primary Cardiologist (physician) and Advanced Practice Providers (APPs -  Physician Assistants and Nurse Practitioners) who all work together to provide you with the care you need, when you need it.  We recommend signing up for the patient portal called "MyChart".  Sign  up information is provided on this After Visit Summary.  MyChart is used to connect with patients for Virtual Visits (Telemedicine).  Patients are able to view lab/test results, encounter notes, upcoming appointments, etc.  Non-urgent messages can be sent to your provider as well.   To learn more about what you can do with MyChart, go to [email or web address].    Your next appointment:   3 month(s)  The format for your next appointment:   In Person  Provider:   You may see Ida Rogue, MD or one of the following Advanced Practice Providers on your designated Care Team:    Murray Hodgkins, NP  Christell Faith, PA-C  Marrianne Mood, PA-C  Cadence Kathlen Mody, [place]  Laurann Montana, NP    Other Instructions   Cardiac CT Angiogram A cardiac CT angiogram is a procedure to look at the heart and the area around the heart. It may be done to help find the cause of chest pains or other symptoms of heart disease. During this procedure, a substance called contrast dye is injected into the blood vessels in the area to be checked. A large X-ray machine, called a CT scanner, then takes detailed pictures of the heart and the surrounding area. The procedure is also sometimes called a coronary CT angiogram, coronary artery scanning, or CTA. A cardiac CT angiogram allows the health care provider to see how well blood is flowing to and from the heart. The health care provider will be able to see if there are any problems, such as:  Blockage or narrowing of the coronary arteries in the heart.  Fluid around the heart.  Signs of weakness or disease in the muscles, valves, and tissues of the heart. Tell a health care provider about:  Any allergies you have. This is especially important if you have had a previous allergic reaction to contrast dye.  All medicines you are taking, including vitamins, herbs, eye drops, creams, and over-the-counter medicines.  Any blood disorders you have.  Any surgeries  you have had.  Any medical conditions you have.  Whether you are pregnant or may be pregnant.  Any anxiety disorders, chronic pain, or other conditions you have that may increase your stress or prevent you from lying still. What are the risks? Generally, this is a safe procedure. However, problems may occur, including:  Bleeding.  Infection.  Allergic reactions to medicines or dyes.  Damage to other structures or organs.  Kidney damage from the contrast dye that is used.  Increased risk of cancer from radiation exposure. This risk is low. Talk with your health care provider about: ? The risks and benefits of testing. ? How you can receive the lowest dose of radiation. What happens before the procedure?  Wear comfortable clothing and remove any jewelry, glasses, dentures, and hearing aids.  Follow instructions from your health care provider about eating and drinking. This may include: ? For 12 hours before the procedure -- avoid caffeine. This includes tea, coffee, soda, energy drinks, and diet pills. Drink plenty of water or other fluids that do not have caffeine in  them. Being well hydrated can prevent complications. ? For 4-6 hours before the procedure -- stop eating and drinking. The contrast dye can cause nausea, but this is less likely if your stomach is empty.  Ask your health care provider about changing or stopping your regular medicines. This is especially important if you are taking diabetes medicines, blood thinners, or medicines to treat problems with erections (erectile dysfunction). What happens during the procedure?  Hair on your chest may need to be removed so that small sticky patches called electrodes can be placed on your chest. These will transmit information that helps to monitor your heart during the procedure.  An IV will be inserted into one of your veins.  You might be given a medicine to control your heart rate during the procedure. This will help to  ensure that good images are obtained.  You will be asked to lie on an exam table. This table will slide in and out of the CT machine during the procedure.  Contrast dye will be injected into the IV. You might feel warm, or you may get a metallic taste in your mouth.  You will be given a medicine called nitroglycerin. This will relax or dilate the arteries in your heart.  The table that you are lying on will move into the CT machine tunnel for the scan.  The person running the machine will give you instructions while the scans are being done. You may be asked to: ? Keep your arms above your head. ? Hold your breath. ? Stay very still, even if the table is moving.  When the scanning is complete, you will be moved out of the machine.  The IV will be removed. The procedure may vary among health care providers and hospitals.   What can I expect after the procedure? After your procedure, it is common to have:  A metallic taste in your mouth from the contrast dye.  A feeling of warmth.  A headache from the nitroglycerin. Follow these instructions at home:  Take over-the-counter and prescription medicines only as told by your health care provider.  If you are told, drink enough fluid to keep your urine pale yellow. This will help to flush the contrast dye out of your body.  Most people can return to their normal activities right after the procedure. Ask your health care provider what activities are safe for you.  It is up to you to get the results of your procedure. Ask your health care provider, or the department that is doing the procedure, when your results will be ready.  Keep all follow-up visits as told by your health care provider. This is important. Contact a health care provider if:  You have any symptoms of allergy to the contrast dye. These include: ? Shortness of breath. ? Rash or hives. ? A racing heartbeat. Summary  A cardiac CT angiogram is a procedure to look at  the heart and the area around the heart. It may be done to help find the cause of chest pains or other symptoms of heart disease.  During this procedure, a large X-ray machine, called a CT scanner, takes detailed pictures of the heart and the surrounding area after a contrast dye has been injected into blood vessels in the area.  Ask your health care provider about changing or stopping your regular medicines before the procedure. This is especially important if you are taking diabetes medicines, blood thinners, or medicines to treat erectile dysfunction.  If  you are told, drink enough fluid to keep your urine pale yellow. This will help to flush the contrast dye out of your body. This information is not intended to replace advice given to you by your health care provider. Make sure you discuss any questions you have with your health care provider. Document Revised: 06/08/2019 Document Reviewed: 06/08/2019 Elsevier Patient Education  2021 [employer].

## 2021-02-03 NOTE — Progress Notes (Signed)
Your abdominal aortic ultrasound was normal.  You do not have an aneurysm

## 2021-02-13 ENCOUNTER — Encounter: Payer: Self-pay | Admitting: Internal Medicine

## 2021-02-13 ENCOUNTER — Telehealth: Payer: Self-pay | Admitting: Pulmonary Disease

## 2021-02-13 ENCOUNTER — Other Ambulatory Visit: Payer: Self-pay

## 2021-02-13 ENCOUNTER — Ambulatory Visit (INDEPENDENT_AMBULATORY_CARE_PROVIDER_SITE_OTHER): Payer: Medicare Other | Admitting: Internal Medicine

## 2021-02-13 VITALS — BP 128/68 | HR 75 | Temp 98.2°F | Resp 16 | Ht 65.0 in | Wt 179.0 lb

## 2021-02-13 DIAGNOSIS — G8929 Other chronic pain: Secondary | ICD-10-CM

## 2021-02-13 DIAGNOSIS — I10 Essential (primary) hypertension: Secondary | ICD-10-CM

## 2021-02-13 DIAGNOSIS — F411 Generalized anxiety disorder: Secondary | ICD-10-CM

## 2021-02-13 DIAGNOSIS — M25562 Pain in left knee: Secondary | ICD-10-CM

## 2021-02-13 DIAGNOSIS — R1012 Left upper quadrant pain: Secondary | ICD-10-CM | POA: Diagnosis not present

## 2021-02-13 MED ORDER — FLUTICASONE PROPIONATE 50 MCG/ACT NA SUSP: 2.0000 | Freq: Every day | NASAL | 3 refills | Status: DC

## 2021-02-13 MED ORDER — FLUOXETINE HCL 10 MG PO TABS: 10.0000 mg | ORAL_TABLET | Freq: Every day | ORAL | 1 refills | Status: DC

## 2021-02-13 NOTE — Patient Instructions (Signed)
I recommend starting prozac at 1/2 tablet daily with breakfast AFTER YOUR cardiac test    advance to a full tablet after a few days if tolerated   Referral to P[facility] Plate Aluicio ([facility]) for left knee pain

## 2021-02-13 NOTE — Telephone Encounter (Signed)
Called [facility] to check status of pt receiving the azelastine nasal spray and the flonase sensimist. Spoke with Tillie Rung who stated that they did receive both Rx but they do not carry flonase sensimist. The Rx for that will need to be just for the flonase nasal spray as that is what they do carry.  Tillie Rung stated that she is not sure why the azelastine was never filled for pt. She stated that pharmacist is going to review this and then call us back.  While we are waiting for a return call, Dr. Halford Chessman please advise if you are okay with [place] changing Rx for hte Flonase sensimist to regular flonase nasal spray.

## 2021-02-13 NOTE — Telephone Encounter (Signed)
Sonal pharmacist with [facility] returned phone call and stated that patient was originally put on dymista nasal spray which is combination of fluticasone and azelastine. An order for just the flonase was sent in today and pharmacist needs clarification as to whether patient should just use flonase or use OR the Dymista. If Dr Halford Chessman would like patient to continue on Dymista instead of flonase then need a new script sent for Dymista otherwise if the plan is to proceed with Flonase ONLY then [facility] can [facility] and [facility] to [facility]. Order for Flonase was sent in this morning.  Dr Halford Chessman please clarify which nasal spray you would like patient to use.

## 2021-02-13 NOTE — Progress Notes (Signed)
Subjective:  Patient ID: Angela Mendoza, female    DOB: 1951/07/14  Age: 70 y.o. MRN: [id number]  CC: The primary encounter diagnosis was Chronic pain of left knee. Diagnoses of LUQ abdominal pain, GAD (generalized anxiety disorder), and Essential hypertension, benign were also pertinent to this visit.  HPI Angela Mendoza presents for follow up on anxiety and elevated blood pressure  This visit occurred during the SARS-CoV-2 public health emergency.  Safety protocols were in place, including screening questions prior to the visit, additional usage of staff PPE, and extensive cleaning of exam room while observing appropriate contact time as indicated for disinfecting solutions.   Seen one month ago for uncontrolled anxiety in the setting of recurrent LUQ pain with cardiac workup under way. Still having anxiety because of the impending move, husband's medical issues ,  His apparent lack of concern about her medical issues and the medical tests she is having .  Using prayer, reading scripture  and detachment .  Tried starting  celexa last month but after taking her first 1/2 tablet, she developed several unpleasant sensations so she stopped.  We dicussed resuming prozac,  Which she had taken for years   Long time ago. Not using xanax bc not sure if she should drive .  Discussed use of xanax.   Workup underway for chest pain, which she is very anxious about potentially having a cardiac cath :  ECHO reviewed and normal U/s reviewed and no aortic aneurysm  Seen  Cardiac CT from 2019: calcium score was 3  Cardiac CT angiogram may 5 .  Cardiac cath discussed as next option   Hypertension: patient has been checking  blood pressure twice weekly at home.  Readings have been for the most part < 140/80 at rest . Patient is following a reduce salt diet most days and is taking medications as prescribed  Outpatient Medications Prior to Visit  Medication Sig Dispense Refill  . albuterol (PROVENTIL HFA) 108  (90 Base) MCG/ACT inhaler Inhale 2 puffs into the lungs every 6 (six) hours as needed. 18 g 2  . ALPRAZolam (XANAX) 0.25 MG tablet TAKE 1 TABLET (0.25 MG TOTAL) BY MOUTH ONCE DAILY AS NEEDED FOR SLEEP. 30 tablet 5  . atorvastatin (LIPITOR) 40 MG tablet Take 1 tablet (40 mg total) by mouth daily. 90 tablet 3  . beclomethasone (QVAR REDIHALER) 80 MCG/ACT inhaler Inhale 2 puffs into the lungs 2 (two) times daily. 31.8 g 3  . Cholecalciferol (VITAMIN D3) 2000 units TABS Take 1 tablet by mouth daily. 90 tablet 3  . Coenzyme Q10 300 MG CAPS Take 1 capsule by mouth daily.    [name] conjugated estrogens (PREMARIN) vaginal cream Place 1 Applicatorful vaginally 2 (two) times a week. 90 day supply,  Refill for one year 90 g 3  . cyanocobalamin (,VITAMIN B-12,) 1000 MCG/ML injection Inject 1 mL (1,000 mcg total) into the muscle every 30 (thirty) days. 3 mL 3  . cycloSPORINE (RESTASIS) 0.05 % ophthalmic emulsion Place 1 drop into both eyes 2 (two) times daily. 3 each 3  . diazepam (VALIUM) 5 MG tablet TAKE 1 TABLET BY MOUTH EVERY 6 HOURS AS NEEDED FOR VERTIGO 30 tablet 2  . Docusate Calcium (STOOL SOFTENER PO) Take 1 tablet by mouth as needed.    [name] EPINEPHrine 0.3 mg/0.3 mL IJ SOAJ injection Inject 0.3 mLs (0.3 mg total) into the muscle as needed for anaphylaxis. 1 each 1  . estradiol (VIVELLE-DOT) 0.1 MG/24HR patch Place 1 patch (  0.1 mg total) onto the skin 2 (two) times a week. 24 patch 3  . famotidine (PEPCID) 20 MG tablet Take 1 tablet (20 mg total) by mouth daily as needed for heartburn or indigestion. 90 tablet 3  . fexofenadine (ALLEGRA ALLERGY) 180 MG tablet Take 1 tablet (180 mg total) by mouth daily. 90 tablet 3  . fluticasone (FLONASE) 50 MCG/ACT nasal spray Place 2 sprays into both nostrils daily. 16 g 3  . levothyroxine (SYNTHROID) 50 MCG tablet Take 1 tablet (50 mcg total) by mouth daily before breakfast. 90 tablet 3  . losartan (COZAAR) 50 MG tablet Take 1 tablet (50 mg total) by mouth at bedtime. 90  tablet 3  . MAGNESIUM PO Take 250 mg by mouth daily.    . meclizine (ANTIVERT) 25 MG tablet Take 1 tablet (25 mg total) by mouth 3 (three) times daily as needed. 270 tablet 3  . metoprolol succinate (TOPROL-XL) 50 MG 24 hr tablet Take 1 tablet (50 mg total) by mouth daily. 90 tablet 3  . metoprolol tartrate (LOPRESSOR) 100 MG tablet Take 1 tablet (100 mg) by mouth 2 hours prior to your Cardiac CT 1 tablet 0  . montelukast (SINGULAIR) 10 MG tablet Take 1 tablet (10 mg total) by mouth at bedtime. 90 tablet 3  . Multiple Vitamin (MULTIVITAMIN) tablet Take 1 tablet by mouth daily.    [name] omega-3 acid ethyl esters (LOVAZA) 1 g capsule Take 1 capsule (1 g total) by mouth daily. 90 capsule 3  . ondansetron (ZOFRAN ODT) 4 MG disintegrating tablet Take 1 tablet (4 mg total) by mouth every 8 (eight) hours as needed for nausea or vomiting. 30 tablet 2  . Propylene Glycol 0.6 % SOLN Apply 1 drop to eye as needed.    . psyllium (METAMUCIL) 58.6 % powder Take 1 packet by mouth as needed.    [name] Spacer/Aero-Holding Chambers (AEROCHAMBER MV) inhaler Use as instructed 1 each 3  . Syringe/Needle, Disp, (SYRINGE 3CC/20GX1") 20G X 1" 3 ML MISC 1 Syringe by Does not apply route every 30 (thirty) days. For use with b-12 injection monthly 50 each 0  . tretinoin (RETIN-A) 0.05 % cream Apply topically at bedtime. 45 g 1  . urea (CARMOL) 10 % cream Apply topically as needed. 71 g 3  . azelastine (ASTELIN) 0.1 % nasal spray Place 1 spray into both nostrils 2 (two) times daily. Use in each nostril as directed (Patient not taking: Reported on 02/13/2021) 30 mL 3   No facility-administered medications prior to visit.    Review of Systems;  Patient denies headache, fevers, malaise, unintentional weight loss, skin rash, eye pain, sinus congestion and sinus pain, sore throat, dysphagia,  hemoptysis , cough, dyspnea, wheezing, chest pain, palpitations, orthopnea, edema, abdominal pain, nausea, melena, diarrhea, constipation, flank  pain, dysuria, hematuria, urinary  Frequency, nocturia, numbness, tingling, seizures,  Focal weakness, Loss of consciousness,  Tremor, insomnia, depression, anxiety, and suicidal ideation.      Objective:  BP 128/68 (BP Location: Left Arm, Patient Position: Sitting, Cuff Size: Large)   Pulse 75   Temp 98.2 F (36.8 C) (Oral)   Resp 16   Ht 5\' 5"  (1.651 m)   Wt 179 lb (81.2 kg)   SpO2 96%   BMI 29.79 kg/m   BP Readings from Last 3 Encounters:  02/13/21 128/68  02/01/21 140/80  01/16/21 130/76    Wt Readings from Last 3 Encounters:  02/13/21 179 lb (81.2 kg)  02/01/21 178 lb (80.7 kg)  01/16/21 179 lb 3.2 oz (81.3 kg)    General appearance: alert, cooperative and appears stated age Ears: normal TM's and external ear canals both ears Throat: lips, mucosa, and tongue normal; teeth and gums normal Neck: no adenopathy, no carotid bruit, supple, symmetrical, trachea midline and thyroid not enlarged, symmetric, no tenderness/mass/nodules Back: symmetric, no curvature. ROM normal. No CVA tenderness. Lungs: clear to auscultation bilaterally Heart: regular rate and rhythm, S1, S2 normal, no murmur, click, rub or gallop Abdomen: soft, non-tender; bowel sounds normal; no masses,  no organomegaly Pulses: 2+ and symmetric Skin: Skin color, texture, turgor normal. No rashes or lesions Lymph nodes: Cervical, supraclavicular, and axillary nodes normal.  Lab Results  Component Value Date   HGBA1C 5.9 10/12/2015    Lab Results  Component Value Date   CREATININE 0.78 12/29/2020   CREATININE 0.82 05/28/2020   CREATININE 0.81 12/08/2019    Lab Results  Component Value Date   WBC 8.1 12/29/2020   HGB 13.6 12/29/2020   HCT 40.6 12/29/2020   PLT 298 12/29/2020   GLUCOSE 101 (H) 12/29/2020   CHOL 182 05/28/2020   TRIG 220.0 (H) 05/28/2020   HDL 62.50 05/28/2020   LDLDIRECT 83.0 05/28/2020   LDLCALC 81 12/08/2019   ALT 22 05/28/2020   AST 20 05/28/2020   NA 134 (L) 12/29/2020    K 3.7 12/29/2020   CL 98 12/29/2020   CREATININE 0.78 12/29/2020   BUN 10 12/29/2020   CO2 25 12/29/2020   TSH 1.35 05/28/2020   HGBA1C 5.9 10/12/2015    DG Chest 2 View  Result Date: 12/29/2020 CLINICAL DATA:  Chest pain. Patient c/o intermittent chest heaviness for a month with nausea. C/o pain in bilateral ear with left being worse, dizziness, pain on left side of head, and numbness to left hand off and on EXAM: CHEST - 2 VIEW COMPARISON:  Chest x-ray 01/08/2018, CT cardiac 07/12/2018. FINDINGS: The heart size and mediastinal contours are within normal limits. Biapical pleural/pulmonary scarring. No focal consolidation. No pulmonary edema. No pleural effusion. No pneumothorax. No acute osseous abnormality. Right upper quadrant abdominal surgical clips. IMPRESSION: No active cardiopulmonary disease. Electronically Signed   By: Iven Finn M.D.   On: 12/29/2020 15:11   CT Head Wo Contrast  Result Date: 12/29/2020 CLINICAL DATA:  Dizziness. intermittent chest heaviness for a month with nausea. C/o pain in bilateral ear with left being worse, dizziness, pain on left side of head, and numbness to left hand off and on. Axo x3 EXAM: CT HEAD WITHOUT CONTRAST TECHNIQUE: Contiguous axial images were obtained from the base of the skull through the vertex without intravenous contrast. COMPARISON:  CT head 05/14/2017 FINDINGS: Brain: No evidence of large-territorial acute infarction. No parenchymal hemorrhage. No mass lesion. No extra-axial collection. No mass effect or midline shift. No hydrocephalus. Basilar cisterns are patent. Vascular: No hyperdense vessel. Skull: No acute fracture or focal lesion. Sinuses/Orbits: Paranasal sinuses and mastoid air cells are clear. The orbits are unremarkable. Other: None. IMPRESSION: No acute intracranial abnormality. Electronically Signed   By: Iven Finn M.D.   On: 12/29/2020 19:41    Assessment & Plan:   Problem List Items Addressed This Visit       Unprioritized   LUQ abdominal pain    [place] was negative for aortic ultrasound      GAD (generalized anxiety disorder)    Reassurance provided that she is unlikely to have critical stenosis given her previously normal tests.  She is willing to resume prozac  since she did not tolerate recent trial of celexa      Relevant Medications   FLUoxetine (PROZAC) 10 MG tablet   Essential hypertension, benign    Well controlled on current regimen of losartan and metoprolol.  Renal function is stable.  No changes today  Lab Results  Component Value Date   CREATININE 0.78 12/29/2020   Lab Results  Component Value Date   NA 134 (L) 12/29/2020   K 3.7 12/29/2020   CL 98 12/29/2020   CO2 25 12/29/2020          Other Visit Diagnoses    Chronic pain of left knee    -  Primary   Relevant Medications   FLUoxetine (PROZAC) 10 MG tablet   Other Relevant Orders   Ambulatory referral to Orthopedic Surgery      I have discontinued Joaquim Lai A. Sowder's azelastine. I am also having her start on FLUoxetine. Additionally, I am having her maintain her Propylene Glycol, psyllium, Docusate Calcium (STOOL SOFTENER PO), fexofenadine, AeroChamber MV, Vitamin D3, multivitamin, MAGNESIUM PO, Coenzyme Q10, diazepam, SYRINGE 3CC/20GX1", omega-3 acid ethyl esters, albuterol, atorvastatin, cyanocobalamin, cycloSPORINE, EPINEPHrine, estradiol, conjugated estrogens, famotidine, levothyroxine, losartan, meclizine, metoprolol succinate, ondansetron, tretinoin, urea, ALPRAZolam, Qvar RediHaler, montelukast, metoprolol tartrate, and fluticasone.  Meds ordered this encounter  Medications  . FLUoxetine (PROZAC) 10 MG tablet    Sig: Take 1 tablet (10 mg total) by mouth daily.    Dispense:  90 tablet    Refill:  1    Medications Discontinued During This Encounter  Medication Reason  . azelastine (ASTELIN) 0.1 % nasal spray     Follow-up: Return in about 2 months (around 04/15/2021).   Crecencio Mc, MD

## 2021-02-13 NOTE — Telephone Encounter (Signed)
Called and spoke with [facility] representative who stated they will have to call back due to not having anything documented as to what they need from [place] as stated when first called and left message stating:returning call regards to the pts nasal spray (ASTELIN) stated they filled it in Feb, but in March has no refills for the script but we sent just the ASTELIN rx and not the combination and they needed clarification. Provided representative with contact number to call us back.

## 2021-02-13 NOTE — Telephone Encounter (Signed)
She was intolerant of dymista.  That is why separate orders for azelastine and flonase were sent.  Please have them follow through with order for flonase.

## 2021-02-13 NOTE — Assessment & Plan Note (Signed)
[place] was negative for aortic ultrasound

## 2021-02-13 NOTE — Telephone Encounter (Signed)
Please reorder azelastine.

## 2021-02-13 NOTE — Telephone Encounter (Signed)
Rx for flonase nasal spray has been sent to preferred pharmacy and have removed flonase sensimist.  Also sent pt a mychart message so she is aware of what I found out and stated to her to try to call the pharmacy later today to see if they are able to ship her the meds.

## 2021-02-13 NOTE — Telephone Encounter (Signed)
Spoke with Sonal, pharmacist from [facility] and confirmed to follow through with order for flonase. Sonal expressed understanding. There currently is no order for Azelastine as it was discontinued last month per Sonal (Pharmacist). Dr Halford Chessman do you want Azelastine ordered as previously done?

## 2021-02-14 MED ORDER — AZELASTINE HCL 0.1 % NA SOLN: 1.0000 | Freq: Two times a day (BID) | NASAL | 3 refills | Status: DC

## 2021-02-14 NOTE — Telephone Encounter (Signed)
Patient updated and made aware of recent discussions with [facility] Pharmacist and reordering of azelastine per Dr Halford Chessman and in agreement with plan. Nothing further needed at this time.

## 2021-02-14 NOTE — Telephone Encounter (Signed)
Azelastine reordered and sent to [facility] per Dr Halford Chessman. Called and left message on voicemail for patient to return phone and will update.

## 2021-02-15 ENCOUNTER — Telehealth: Payer: Self-pay | Admitting: Pulmonary Disease

## 2021-02-15 ENCOUNTER — Ambulatory Visit: Payer: Medicare Other | Attending: Critical Care Medicine

## 2021-02-15 DIAGNOSIS — Z20822 Contact with and (suspected) exposure to covid-19: Secondary | ICD-10-CM

## 2021-02-15 NOTE — Assessment & Plan Note (Signed)
Reassurance provided that she is unlikely to have critical stenosis given her previously normal tests.  She is willing to resume prozac since she did not tolerate recent trial of celexa

## 2021-02-15 NOTE — Telephone Encounter (Signed)
Patient is aware of below message/recommendations and voiced his understanding.  °Nothing further needed at this time.  ° °

## 2021-02-15 NOTE — Assessment & Plan Note (Signed)
Well controlled on current regimen of losartan and metoprolol.  Renal function is stable.  No changes today  Lab Results  Component Value Date   CREATININE 0.78 12/29/2020   Lab Results  Component Value Date   NA 134 (L) 12/29/2020   K 3.7 12/29/2020   CL 98 12/29/2020   CO2 25 12/29/2020

## 2021-02-15 NOTE — Telephone Encounter (Signed)
Called and spoke to patient.  Patient stated that a member of her household tested positive for covid yesterday. Patient took at home covid test last night which was negative.  She reports of headache, fatigue, dry cough, sore throat, change in taste, sweats, runny nose clear in color and mild sob. Sx started on 02/13/2021. She feels that she has a fever, but she has not measured her tempeture.  Patient is fully vaccinated against covid with two vaccines and two boosters.  She has not needed to use albuterol HFA. She is using qvar BID.  No current OTC meds to help with sx.  She does not wear supplemental oxygen.  I have recommended that she have a PCR test and quarantine. Patient will call back with results of test.  Patient has been provided with testing site information.   VS, please advise. Thanks

## 2021-02-15 NOTE — Telephone Encounter (Signed)
Agree with plan to get COVID testing again.  Her symptoms could also be related to seasonal allergies.

## 2021-02-16 LAB — NOVEL CORONAVIRUS, NAA: SARS-CoV-2, NAA: DETECTED — AB

## 2021-02-16 LAB — SARS-COV-2, NAA 2 DAY TAT

## 2021-02-17 ENCOUNTER — Telehealth: Payer: Self-pay | Admitting: Infectious Diseases

## 2021-02-17 DIAGNOSIS — U071 COVID-19: Secondary | ICD-10-CM

## 2021-02-17 MED ORDER — NIRMATRELVIR/RITONAVIR (PAXLOVID)TABLET: 3.0000 | ORAL_TABLET | Freq: Two times a day (BID) | ORAL | 0 refills | Status: AC

## 2021-02-17 NOTE — Telephone Encounter (Signed)
Outpatient Oral COVID Treatment Note  I connected with Angela Mendoza on 02/17/2021/11:46 AM by telephone and verified that I am speaking with the correct person using two identifiers.  I discussed the limitations, risks, security, and privacy concerns of performing an evaluation and management service by telephone and the availability of in person appointments. I also discussed with the patient that there may be a patient responsible charge related to this service. The patient expressed understanding and agreed to proceed.  Patient location: [place], [place] Provider location:  residence   Diagnosis: COVID-19 infection  Purpose of visit: Discussion of potential use of Molnupiravir or Paxlovid, a new treatment for mild to moderate COVID-19 viral infection in non-hospitalized patients.   Subjective: Patient is a 70 y.o. female who has been diagnosed with COVID 19 viral infection.  Their symptoms began on 02/13/2021 with sneezing, coughing, shortness of breath  Past Medical History:  Diagnosis Date  . Agatston coronary artery calcium score less than 100    a. 06/2018 Cardiac CT: Ca2+ = 3 (49th %'ile).  . Allergic rhinitis 06/09/2008   chronic  . Aortic atherosclerosis ([place])    a. 06/2018 noted on chest CT.  [name] Asthma, intrinsic 10/06/2011   controlled on qvar. [name] 09/2011:  Very mild airflow obstruction (FEV1% 68, FVL c/w obstruction)   . Chronic headache 06/09/2008  . COPD (chronic obstructive pulmonary disease) ([place])   . Depression   . Diverticulosis 2013   h/o diverticulitis  . Dry eyes   . GERD (gastroesophageal reflux disease)   . History of breast implant removal    silicone mastitis - R axilla silicone LN, free silicone L breast upper outer quadrant  . Hot flashes   . HYPERLIPIDEMIA 06/09/2008  . HYPERTENSION 06/09/2008  . Hypothyroidism   . MVP (mitral valve prolapse)   . OSA on CPAP    [name]  . PAF (paroxysmal atrial fibrillation) ([place]) 06/09/2008   a. CHA2DS2VASc =  3-->prefers ASA.  [name] Pernicious anemia    per prior pcp  . Rosacea   . Surgical menopause 1991  . Vertigo   . Vitamin D deficiency     Allergies  Allergen Reactions  . Codeine     Low blood pressure/nausea  . Sulfonamide Derivatives     rash  . Estradiol Rash    Generic estradiol patch d/t the adhesive     Current Outpatient Medications:  .  albuterol (PROVENTIL HFA) 108 (90 Base) MCG/ACT inhaler, Inhale 2 puffs into the lungs every 6 (six) hours as needed., Disp: 18 g, Rfl: 2 .  ALPRAZolam (XANAX) 0.25 MG tablet, TAKE 1 TABLET (0.25 MG TOTAL) BY MOUTH ONCE DAILY AS NEEDED FOR SLEEP., Disp: 30 tablet, Rfl: 5 .  atorvastatin (LIPITOR) 40 MG tablet, Take 1 tablet (40 mg total) by mouth daily., Disp: 90 tablet, Rfl: 3 .  azelastine (ASTELIN) 0.1 % nasal spray, Place 1 spray into both nostrils 2 (two) times daily. Use in each nostril as directed, Disp: 30 mL, Rfl: 3 .  beclomethasone (QVAR REDIHALER) 80 MCG/ACT inhaler, Inhale 2 puffs into the lungs 2 (two) times daily., Disp: 31.8 g, Rfl: 3 .  Cholecalciferol (VITAMIN D3) 2000 units TABS, Take 1 tablet by mouth daily., Disp: 90 tablet, Rfl: 3 .  Coenzyme Q10 300 MG CAPS, Take 1 capsule by mouth daily., Disp: , Rfl:  .  conjugated estrogens (PREMARIN) vaginal cream, Place 1 Applicatorful vaginally 2 (two) times a week. 90 day supply,  Refill for one year, Disp: 90  g, Rfl: 3 .  cyanocobalamin (,VITAMIN B-12,) 1000 MCG/ML injection, Inject 1 mL (1,000 mcg total) into the muscle every 30 (thirty) days., Disp: 3 mL, Rfl: 3 .  cycloSPORINE (RESTASIS) 0.05 % ophthalmic emulsion, Place 1 drop into both eyes 2 (two) times daily., Disp: 3 each, Rfl: 3 .  diazepam (VALIUM) 5 MG tablet, TAKE 1 TABLET BY MOUTH EVERY 6 HOURS AS NEEDED FOR VERTIGO, Disp: 30 tablet, Rfl: 2 .  Docusate Calcium (STOOL SOFTENER PO), Take 1 tablet by mouth as needed., Disp: , Rfl:  .  EPINEPHrine 0.3 mg/0.3 mL IJ SOAJ injection, Inject 0.3 mLs (0.3 mg total) into the muscle  as needed for anaphylaxis., Disp: 1 each, Rfl: 1 .  estradiol (VIVELLE-DOT) 0.1 MG/24HR patch, Place 1 patch (0.1 mg total) onto the skin 2 (two) times a week., Disp: 24 patch, Rfl: 3 .  famotidine (PEPCID) 20 MG tablet, Take 1 tablet (20 mg total) by mouth daily as needed for heartburn or indigestion., Disp: 90 tablet, Rfl: 3 .  fexofenadine (ALLEGRA ALLERGY) 180 MG tablet, Take 1 tablet (180 mg total) by mouth daily., Disp: 90 tablet, Rfl: 3 .  FLUoxetine (PROZAC) 10 MG tablet, Take 1 tablet (10 mg total) by mouth daily., Disp: 90 tablet, Rfl: 1 .  fluticasone (FLONASE) 50 MCG/ACT nasal spray, Place 2 sprays into both nostrils daily., Disp: 16 g, Rfl: 3 .  levothyroxine (SYNTHROID) 50 MCG tablet, Take 1 tablet (50 mcg total) by mouth daily before breakfast., Disp: 90 tablet, Rfl: 3 .  losartan (COZAAR) 50 MG tablet, Take 1 tablet (50 mg total) by mouth at bedtime., Disp: 90 tablet, Rfl: 3 .  MAGNESIUM PO, Take 250 mg by mouth daily., Disp: , Rfl:  .  meclizine (ANTIVERT) 25 MG tablet, Take 1 tablet (25 mg total) by mouth 3 (three) times daily as needed., Disp: 270 tablet, Rfl: 3 .  metoprolol succinate (TOPROL-XL) 50 MG 24 hr tablet, Take 1 tablet (50 mg total) by mouth daily., Disp: 90 tablet, Rfl: 3 .  metoprolol tartrate (LOPRESSOR) 100 MG tablet, Take 1 tablet (100 mg) by mouth 2 hours prior to your Cardiac CT, Disp: 1 tablet, Rfl: 0 .  montelukast (SINGULAIR) 10 MG tablet, Take 1 tablet (10 mg total) by mouth at bedtime., Disp: 90 tablet, Rfl: 3 .  Multiple Vitamin (MULTIVITAMIN) tablet, Take 1 tablet by mouth daily., Disp: , Rfl:  .  omega-3 acid ethyl esters (LOVAZA) 1 g capsule, Take 1 capsule (1 g total) by mouth daily., Disp: 90 capsule, Rfl: 3 .  ondansetron (ZOFRAN ODT) 4 MG disintegrating tablet, Take 1 tablet (4 mg total) by mouth every 8 (eight) hours as needed for nausea or vomiting., Disp: 30 tablet, Rfl: 2 .  Propylene Glycol 0.6 % SOLN, Apply 1 drop to eye as needed., Disp: ,  Rfl:  .  psyllium (METAMUCIL) 58.6 % powder, Take 1 packet by mouth as needed., Disp: , Rfl:  .  Spacer/Aero-Holding Chambers (AEROCHAMBER MV) inhaler, Use as instructed, Disp: 1 each, Rfl: 3 .  Syringe/Needle, Disp, (SYRINGE 3CC/20GX1") 20G X 1" 3 ML MISC, 1 Syringe by Does not apply route every 30 (thirty) days. For use with b-12 injection monthly, Disp: 50 each, Rfl: 0 .  tretinoin (RETIN-A) 0.05 % cream, Apply topically at bedtime., Disp: 45 g, Rfl: 1 .  urea (CARMOL) 10 % cream, Apply topically as needed., Disp: 71 g, Rfl: 3  Objective: Patient appears/sounds mildly ill.  They are in no apparent distress.  Breathing is non labored.  Mood and behavior are normal.  Laboratory Data:  Recent Results (from the past 2160 hour(s))  Basic metabolic panel     Status: Abnormal   Collection Time: 12/29/20  2:19 PM  Result Value Ref Range   Sodium 134 (L) 135 - 145 mmol/L   Potassium 3.7 3.5 - 5.1 mmol/L   Chloride 98 98 - 111 mmol/L   CO2 25 22 - 32 mmol/L   Glucose, Bld 101 (H) 70 - 99 mg/dL    Comment: Glucose reference range applies only to samples taken after fasting for at least 8 hours.   BUN 10 8 - 23 mg/dL   Creatinine, Ser 0.78 0.44 - 1.00 mg/dL   Calcium 9.5 8.9 - 10.3 mg/dL   GFR, Estimated >60 >60 mL/min    Comment: (NOTE) Calculated using the CKD-EPI Creatinine Equation (2021)    Anion gap 11 5 - 15    Comment: Performed at [facility], [address] [place], [place] [address]  CBC     Status: None   Collection Time: 12/29/20  2:19 PM  Result Value Ref Range   WBC 8.1 4.0 - 10.5 K/uL   RBC 4.41 3.87 - 5.11 MIL/uL   Hemoglobin 13.6 12.0 - 15.0 g/dL   HCT 40.6 36.0 - 46.0 %   MCV 92.1 80.0 - 100.0 fL   MCH 30.8 26.0 - 34.0 pg   MCHC 33.5 30.0 - 36.0 g/dL   RDW 12.5 11.5 - 15.5 %   Platelets 298 150 - 400 K/uL   nRBC 0.0 0.0 - 0.2 %    Comment: Performed at [facility], [place], [place] [address]  Troponin I (High Sensitivity)      Status: None   Collection Time: 12/29/20  2:19 PM  Result Value Ref Range   Troponin I (High Sensitivity) 3 <18 ng/L    Comment: (NOTE) Elevated high sensitivity troponin I (hsTnI) values and significant  changes across serial measurements may suggest ACS but many other  chronic and acute conditions are known to elevate hsTnI results.  Refer to the "Links" section for chest pain algorithms and additional  guidance. Performed at [facility], [place], [place] [address]   Troponin I (High Sensitivity)     Status: None   Collection Time: 12/29/20  5:50 PM  Result Value Ref Range   Troponin I (High Sensitivity) 4 <18 ng/L    Comment: (NOTE) Elevated high sensitivity troponin I (hsTnI) values and significant  changes across serial measurements may suggest ACS but many other  chronic and acute conditions are known to elevate hsTnI results.  Refer to the "Links" section for chest pain algorithms and additional  guidance. Performed at [facility], [address] [place], [place] [address]   ECHOCARDIOGRAM COMPLETE     Status: None   Collection Time: 01/23/21 10:46 AM  Result Value Ref Range   AR max vel 2.98 cm2   AV Peak grad 5.4 mmHg   Ao pk vel 1.16 m/s   S' Lateral 3.30 cm   Area-P 1/2 3.13 cm2   AV Area VTI 3.01 cm2   AV Mean grad 3.0 mmHg   Single Plane A4C EF 54.2 %   Single Plane A2C EF 52.7 %   Calc EF 53.5 %   AV Area mean vel 3.00 cm2  Novel Coronavirus, NAA ([facility])     Status: Abnormal   Collection Time: 02/15/21  1:39 PM   Specimen: Nasopharyngeal(NP) swabs in vial transport medium   Nasopharynge  Result Value Ref Range   SARS-CoV-2, NAA Detected (A) Not Detected    Comment: Patients who have a positive COVID-19 test result may now have treatment options. Treatment options are available for patients with mild to moderate symptoms and for hospitalized patients. Visit our website at [email or web address]  for resources and information. This nucleic acid amplification test was developed and its performance characteristics determined by [employer]. Nucleic acid amplification tests include RT-PCR and TMA. This test has not been FDA cleared or approved. This test has been authorized by FDA under an Emergency Use Authorization (EUA). This test is only authorized for the duration of time the declaration that circumstances exist justifying the authorization of the emergency use of in vitro diagnostic tests for detection of SARS-CoV-2 virus and/or diagnosis of COVID-19 infection under section 564(b)(1) of the Act, 21 U.S.C. [id number]) (1), unless the authorization is terminated or revoked sooner. When diagnostic testing is negativ e, the possibility of a false negative result should be considered in the context of a patient's recent exposures and the presence of clinical signs and symptoms consistent with COVID-19. An individual without symptoms of COVID-19 and who is not shedding SARS-CoV-2 virus would expect to have a negative (not detected) result in this assay.   SARS-COV-2, NAA 2 DAY TAT     Status: None   Collection Time: 02/15/21  1:39 PM   Nasopharynge  Result Value Ref Range   SARS-CoV-2, NAA 2 DAY TAT Performed      Assessment: 70 y.o. female with mild/moderate COVID 19 viral infection diagnosed on 02/15/2021 at high risk for progression to severe COVID 19.  Plan:  This patient is a 70 y.o. female that meets the following criteria for Emergency Use Authorization of: Paxlovid 1. Age >12 yr AND > 40 kg 2. SARS-COV-2 positive test 3. Symptom onset < 5 days 4. Mild-to-moderate COVID disease with high risk for severe progression to hospitalization or death  I have spoken and communicated the following to the patient or parent/caregiver regarding: 1. Paxlovid is an unapproved drug that is authorized for use under an Emergency Use Authorization.  2. There are no adequate,  approved, available products for the treatment of COVID-19 in adults who have mild-to-moderate COVID-19 and are at high risk for progressing to severe COVID-19, including hospitalization or death. 3. Other therapeutics are currently authorized. For additional information on all products authorized for treatment or prevention of COVID-19, please see [email or web address].  4. There are benefits and risks of taking this treatment as outlined in the "Fact Sheet for Patients and Caregivers."  5. "Fact Sheet for Patients and Caregivers" was reviewed with patient. A hard copy will be provided to patient from pharmacy prior to the patient receiving treatment. 6. Patients should continue to self-isolate and use infection control measures (e.g., wear mask, isolate, social distance, avoid sharing personal items, clean and disinfect "high touch" surfaces, and frequent handwashing) according to CDC guidelines.  7. The patient or parent/caregiver has the option to accept or refuse treatment. 8. Patient medication history was reviewed for potential drug interactions:Interaction with home meds: Flonase - patient will hold, Statin - patient will hold; valium - patient will hold 9. Patient's GFR was calculated to be > 60, and they were therefore prescribed Normal dose (GFR>60) - nirmatrelvir 150mg  tab (2 tablet) by mouth twice daily AND ritonavir 100mg  tab (1 tablet) by mouth twice daily   After reviewing above  information with the patient, the patient agrees to receive Paxlovid.  Follow up instructions:    . Take prescription BID x 5 days as directed . Reach out to pharmacist for counseling on medication if desired . For concerns regarding further COVID symptoms please follow up with your PCP or urgent care . For urgent or life-threatening issues, seek care at your local emergency department  The patient was  provided an opportunity to ask questions, and all were answered. The patient agreed with the plan and demonstrated an understanding of the instructions.   Script sent [facility] in [place], [place] based on [place] [facility] treatment locator  The patient was advised to call their PCP or seek an in-person evaluation if the symptoms worsen or if the condition fails to improve as anticipated.   I provided 10 minutes of non face-to-face telephone visit time during this encounter, and > 50% was spent counseling as documented under my assessment & plan.  Janene Madeira, NP 02/17/2021 /11:46 AM

## 2021-02-18 ENCOUNTER — Telehealth: Payer: Self-pay | Admitting: Pulmonary Disease

## 2021-02-18 ENCOUNTER — Telehealth: Payer: Self-pay | Admitting: Internal Medicine

## 2021-02-18 ENCOUNTER — Telehealth: Payer: Self-pay | Admitting: Cardiovascular Disease

## 2021-02-18 NOTE — Telephone Encounter (Signed)
Pt stated that she was in our office last Wednesday and started feeling sick that evening. She took a home test on Wednesday night it was negative and then took another one Thursday morning because she was feeling worse and it was positive. Pt went to urgent care and then [facility] reached out to her and prescribed her the oral anti viral treatment. Pt wanted to let you know that the medication that they put her on is a five day trial. Pt stated that she is starting to feel better other than a deep cough.

## 2021-02-18 NOTE — Telephone Encounter (Signed)
Patient wanted Dr Derrel Nip to know that she tested positive for covid. [place] has reach out to her and prescribe a anti viral medication.

## 2021-02-18 NOTE — Telephone Encounter (Signed)
Pt just calling to let VS know that she tested positive for covid. Pt was placed on trial antiviral- Paxlovid,for 5 days. Started yesterday.If VS wants her to switch or anything please let her know also when she needs to follow up.Pt can be reached at [phone]

## 2021-02-18 NOTE — Telephone Encounter (Signed)
She doesn't need to change any of her other medications.  She can keep regular follow up if her symptoms from COVID resolve.  If she develops trouble with her breathing after completed COVID therapy then she should call our office to discuss.

## 2021-02-18 NOTE — Telephone Encounter (Signed)
Patient calling Patient has a CT scan on 02/28/21  Her COVID test came back positive - she spoke with CT office and they stated it will be ok to continue with test  Patient is concerned because she is taking a trial antiviral medication Paxlovid and wants to know if it will affect results or if it may have a reaction to dye used  Please call to discuss

## 2021-02-18 NOTE — Telephone Encounter (Signed)
Spoke to pt and made aware of PharmD recc below. Pt appreciative of information and voiced understanding.  She does still choose to push out cardiac CT further (currently scheduled 02/28/21).  Angela Mendoza's number given to pt to call for schedule changes.

## 2021-02-18 NOTE — Telephone Encounter (Signed)
Paxlovid still NEW on the market and we don't have lots of post-marketing data, but  Paxlovid is a combination of 2 medications and one of the medications is well known with NO interactions with contrast. The other medication is NOT expected to cause problems or interactions with contracts either.

## 2021-02-18 NOTE — Telephone Encounter (Signed)
Called and spoke with pt letting her know the info stated by  VS and she verbalized understanding. Nothing further needed. 

## 2021-02-19 NOTE — Telephone Encounter (Signed)
I will send her a MyChart today but there is no direct interaction that is known presently and there is a significant amount of time between when she completes the Paxlovid and her scan 5/5. I don't see an absolute need to post-pone the CT but if she wishes to wait I think that is OK as well given it is a non-urgent study.

## 2021-02-19 NOTE — Telephone Encounter (Signed)
By looking at current medication list. I recommend holding atorvastatin and stop using alprazolam while on Paxlovid (1 week), then okay to resume as usual.   Paxlovid can increase levels of both medication even with short term use.

## 2021-02-26 ENCOUNTER — Telehealth: Payer: Self-pay | Admitting: Pulmonary Disease

## 2021-02-26 MED ORDER — PREDNISONE 10 MG PO TABS: ORAL_TABLET | ORAL | 0 refills | Status: AC

## 2021-02-26 NOTE — Telephone Encounter (Signed)
Can send script for prednisone 10 mg pill >> 4 pills daily for 2 days, 3 pills daily for 2 days, 2 pills daily for 2 days, 1 pill daily for 2 days.  Please schedule video visit with me or NP sometime this week.

## 2021-02-26 NOTE — Telephone Encounter (Signed)
Called spoke with patient Let her know Dr. Collie Siad recommendations Patient voiced back understanding Pharmacy verified Order placed Scheduled video visit with Dr. Halford Chessman this week 02/27/21  Nothing further needed at this time.

## 2021-02-26 NOTE — Telephone Encounter (Signed)
Primary Pulmonologist: Dr Halford Chessman Last office visit and with whom: 01/07/21, Dr Halford Chessman What do we see them for (pulmonary problems): OSA, upper airway cough syndrome Last OV assessment/plan:  Instructions    Return in about 6 months (around 07/10/2021). Azelastine one spray in each nostril twice per day  Flonase sensimist 1 spray in each nostril daily  Stop using dymista  Follow up in 6 months         Reason for call:  Called and spoke with Patient. Patient stated she was tested positive for covid 02/15/21 and felt better last week. Patient stated this week she is having a mostly dry cough, but at times she is able to produce clear/white sputum. Patient stated she has a runny nose off and on, with clear mucus. Patient stated she has off and on head congestion. Patient stated she is using her emergency inhaled mucinex. Patient stated she felt mucinex did help with her cough last night.  Patient stated she took a rapid covid test this morning and it was positive. Patient is concerned she may still have covid or covid has caused her to have a asthma flare. Patient stated she has not received her replacement cpap from [facility] yet and did not know if that may also be causing problems.  Message routed to Dr. Halford Chessman to advise     Allergies  Allergen Reactions  . Codeine     Low blood pressure/nausea  . Sulfonamide Derivatives     rash  . Estradiol Rash    Generic estradiol patch d/t the adhesive    Immunization History  Administered Date(s) Administered  . Fluad Quad(high Dose 65+) 07/16/2019, 07/20/2020  . Influenza Split 07/15/2013, 08/04/2017  . Influenza Whole 07/27/2009, 07/28/2011, 07/21/2012  . Influenza, High Dose Seasonal PF 07/22/2017, 07/01/2018  . Influenza,inj,Quad PF,6+ Mos 08/03/2014, 07/12/2015, 07/14/2016  . Influenza-Unspecified 07/15/2013, 08/03/2014, 07/12/2015, 07/14/2016, 08/04/2017  . Moderna SARS-COV2 Booster Vaccination 08/17/2020, 02/01/2021  . Moderna  Sars-Covid-2 Vaccination 12/11/2019, 01/08/2020  . Pneumococcal Conjugate-13 11/08/2014  . Pneumococcal Polysaccharide-23 07/27/2009, 12/27/2015  . Td 10/27/2008  . Tdap 12/19/2013, 02/25/2017  . Tetanus 10/27/2008  . Zoster 10/27/2009  . Zoster Recombinat (Shingrix) 04/27/2020, 11/26/2020

## 2021-02-27 ENCOUNTER — Encounter: Payer: Self-pay | Admitting: Pulmonary Disease

## 2021-02-27 ENCOUNTER — Telehealth (INDEPENDENT_AMBULATORY_CARE_PROVIDER_SITE_OTHER): Payer: Medicare Other | Admitting: Pulmonary Disease

## 2021-02-27 ENCOUNTER — Telehealth: Payer: Self-pay | Admitting: Pulmonary Disease

## 2021-02-27 DIAGNOSIS — J4551 Severe persistent asthma with (acute) exacerbation: Secondary | ICD-10-CM | POA: Diagnosis not present

## 2021-02-27 DIAGNOSIS — G4733 Obstructive sleep apnea (adult) (pediatric): Secondary | ICD-10-CM

## 2021-02-27 DIAGNOSIS — U071 COVID-19: Secondary | ICD-10-CM

## 2021-02-27 DIAGNOSIS — Z9989 Dependence on other enabling machines and devices: Secondary | ICD-10-CM

## 2021-02-27 MED ORDER — FLUCONAZOLE 100 MG PO TABS: ORAL_TABLET | ORAL | 0 refills | Status: AC

## 2021-02-27 NOTE — Telephone Encounter (Signed)
I checked referral to see what order this was about- last referral to [facility] 11/20/20- is this was they are referring to? B/c the msg says "rx". Needing clarification. Thanks.

## 2021-02-27 NOTE — Progress Notes (Signed)
[facility]  Chief Complaint  Patient presents with  . Follow-up    Patient recently tested positive for covid in late April. Stated that she stated the prednisone taper yesterday afternoon and is feeling a little better.   She also stated that she is still trying to use her cpap machine but has not received the new machine from [facility].     Constitutional:  There were no vitals taken for this visit.  Deferred.  Past Medical History:  Vit D deficiency, Vertigo, Rosacea, Hypothyroidism, HTN, HLD, GERD, Diverticulosis, Depression, HA  Past Surgical History:  She  has a past surgical history that includes Cholecystectomy (2007); Appendectomy (2007); Tubal ligation; Total abdominal hysterectomy (1991); Nasal sinus surgery; Breast enhancement surgery (2006); Cardiovascular stress test (2013); Colonoscopy (08/2012); Esophagogastroduodenoscopy (08/2012); US ECHOCARDIOGRAPHY (09/2009); US ECHOCARDIOGRAPHY (11/2007); sleep study (08/2007); Rectocele repair (12/2008); Colonoscopy (2006); Breast biopsy (Left, 09/27/2009); and Hernia repair.  Brief Summary:  Angela Mendoza is a 70 y.o. female former smoker with OSA and asthma.      Subjective:   Virtual Visit via Video Note  I connected with Angela Mendoza on 02/27/21 at 10:15 AM EDT by a video enabled telemedicine application and verified that I am speaking with the correct person using two identifiers.  Location: Patient: home Provider: medical office   I discussed the limitations of evaluation and management by telemedicine and the availability of in person appointments. The patient expressed understanding and agreed to proceed.  Tried video connection, but didn't work.  She tested positive for COVID at end of April.  Treated with paxlovid and felt improvement.  Few days after finishing this she developed cough with white sputum, and wheeze.  Also having sinus congestion.  No fever, chest  pain, or hemoptysis.  Had prednisone called in, and this helped.  Has noticed thrush recurrence in her throat.  She is still waiting to get new CPAP machine.    Physical Exam:   Deferred.    Pulmonary testing:   Spirometry 12/12 >> FEV1% 68  RAST 11/15/14 >> IgE 30, cats/mold  PFT 02/07/16 >> FEV1 2.32 (92%), FEV1% 76, FEF 25-75% 1.56 (71%), TLC 4.25 (81%), DLCO 121, borderline BD from FEF 25-75  FeNO 12/01/18 >> 7  Chest Imaging:    Sleep Tests:   PSG 09/06/07 >> AHI 19  CPAP 12/05/20 to 01/03/21 >> used on 30 of 30 nights with average 8 hrs 15 min.  Average AHI 0.6 with CPAP 11 cm H2O  Cardiac Tests:    Social History:  She  reports that she quit smoking about 43 years ago. Her smoking use included cigarettes. She has a 2.10 pack-year smoking history. She has never used smokeless tobacco. She reports previous alcohol use. She reports that she does not use drugs.  Family History:  Her family history includes AAA (abdominal aortic aneurysm) in her paternal uncle; Aortic dissection in her maternal aunt and paternal uncle; Breast cancer in her maternal aunt; Cancer in her cousin and paternal aunt; Cancer (age of onset: 4) in her father; Cancer (age of onset: 86) in her mother; Heart attack in her paternal uncle; Heart disease in her paternal uncle; Heart failure in her sister; Hypertension in her sister; Stroke in her paternal uncle and sister.    Discussion:  She had COVID 19 at end of April and improved after treatment with paxlovid.  More recent symptoms likely represent asthma exacerbation rather than persistent COVID infection, or bacterial  superinfection.  Assessment/Plan:   Allergic asthma with acute exacerbation. - finish course of prednisone - continue Qvar and prn albuterol with spacer device - continue singulair - gets her scripts through [facility]. - has recurrent episode - she doesn't improve with nystatin - will give course of fluconazole (200 mg on  day 1, then 100 mg daily for next 6 days) - she reports compliance with oral hygiene after using Qvar; might need to consider changing to different formulation of inhaled steroid  Upper airway cough with allergic rhinitis. - dysmista caused funny taste - continue flonase - prn azelastine - continue singulair - prn allegra, nasal irrigation  Obstructive sleep apnea. - she is compliant with CPAP and reports benefit from therapy - uses [facility] for her DME - her current device has gone past life of it's motor - she is still waiting for replacement CPAP machine - continue CPAP 11 cm H2O  Time Spent Involved in Patient Care on Day of Examination:   I discussed the assessment and treatment plan with the patient. The patient was provided an opportunity to ask questions and all were answered. The patient agreed with the plan and demonstrated an understanding of the instructions.   The patient was advised to call back or seek an in-person evaluation if the symptoms worsen or if the condition fails to improve as anticipated.  I provided 26 minutes of non-face-to-face time during this encounter.  Follow up:  Patient Instructions  Follow up in 4 months   Medication List:   Allergies as of 02/27/2021      Reactions   Codeine    Low blood pressure/nausea   Sulfonamide Derivatives    rash      Medication List       Accurate as of Feb 27, 2021 10:38 AM. If you have any questions, ask your nurse or doctor.        STOP taking these medications   diazepam 5 MG tablet Commonly known as: VALIUM Stopped by: Chesley Mires, MD   EPINEPHrine 0.3 mg/0.3 mL Soaj injection Commonly known as: EPI-PEN Stopped by: Chesley Mires, MD     TAKE these medications   AeroChamber MV inhaler Use as instructed   albuterol 108 (90 Base) MCG/ACT inhaler Commonly known as: Proventil HFA Inhale 2 puffs into the lungs every 6 (six) hours as needed.   ALPRAZolam 0.25 MG tablet Commonly known as:  XANAX TAKE 1 TABLET (0.25 MG TOTAL) BY MOUTH ONCE DAILY AS NEEDED FOR SLEEP.   atorvastatin 40 MG tablet Commonly known as: LIPITOR Take 1 tablet (40 mg total) by mouth daily.   azelastine 0.1 % nasal spray Commonly known as: ASTELIN Place 1 spray into both nostrils 2 (two) times daily. Use in each nostril as directed   Coenzyme Q10 300 MG Caps Take 1 capsule by mouth daily.   conjugated estrogens vaginal cream Commonly known as: PREMARIN Place 1 Applicatorful vaginally 2 (two) times a week. 90 day supply,  Refill for one year   cyanocobalamin 1000 MCG/ML injection Commonly known as: (VITAMIN B-12) Inject 1 mL (1,000 mcg total) into the muscle every 30 (thirty) days.   cycloSPORINE 0.05 % ophthalmic emulsion Commonly known as: RESTASIS Place 1 drop into both eyes 2 (two) times daily.   estradiol 0.1 MG/24HR patch Commonly known as: VIVELLE-DOT Place 1 patch (0.1 mg total) onto the skin 2 (two) times a week.   famotidine 20 MG tablet Commonly known as: PEPCID Take 1 tablet (20  mg total) by mouth daily as needed for heartburn or indigestion.   fexofenadine 180 MG tablet Commonly known as: Allegra Allergy Take 1 tablet (180 mg total) by mouth daily.   FLUoxetine 10 MG tablet Commonly known as: PROZAC Take 1 tablet (10 mg total) by mouth daily.   fluticasone 50 MCG/ACT nasal spray Commonly known as: FLONASE Place 2 sprays into both nostrils daily.   levothyroxine 50 MCG tablet Commonly known as: Synthroid Take 1 tablet (50 mcg total) by mouth daily before breakfast.   losartan 50 MG tablet Commonly known as: COZAAR Take 1 tablet (50 mg total) by mouth at bedtime.   MAGNESIUM PO Take 250 mg by mouth daily.   meclizine 25 MG tablet Commonly known as: ANTIVERT Take 1 tablet (25 mg total) by mouth 3 (three) times daily as needed.   metoprolol succinate 50 MG 24 hr tablet Commonly known as: TOPROL-XL Take 1 tablet (50 mg total) by mouth daily.   metoprolol  tartrate 100 MG tablet Commonly known as: LOPRESSOR Take 1 tablet (100 mg) by mouth 2 hours prior to your Cardiac CT   montelukast 10 MG tablet Commonly known as: SINGULAIR Take 1 tablet (10 mg total) by mouth at bedtime.   multivitamin tablet Take 1 tablet by mouth daily.   omega-3 acid ethyl esters 1 g capsule Commonly known as: LOVAZA Take 1 capsule (1 g total) by mouth daily.   ondansetron 4 MG disintegrating tablet Commonly known as: Zofran ODT Take 1 tablet (4 mg total) by mouth every 8 (eight) hours as needed for nausea or vomiting.   predniSONE 10 MG tablet Commonly known as: DELTASONE Take 4 tablets (40 mg total) by mouth daily with breakfast for 2 days, THEN 3 tablets (30 mg total) daily with breakfast for 2 days, THEN 2 tablets (20 mg total) daily with breakfast for 2 days, THEN 1 tablet (10 mg total) daily with breakfast for 2 days. Start taking on: Feb 26, 2021   Propylene Glycol 0.6 % Soln Apply 1 drop to eye as needed.   psyllium 58.6 % powder Commonly known as: METAMUCIL Take 1 packet by mouth as needed.   Qvar RediHaler 80 MCG/ACT inhaler Generic drug: beclomethasone Inhale 2 puffs into the lungs 2 (two) times daily.   STOOL SOFTENER PO Take 1 tablet by mouth as needed.   SYRINGE 3CC/20GX1" 20G X 1" 3 ML Misc 1 Syringe by Does not apply route every 30 (thirty) days. For use with b-12 injection monthly   tretinoin 0.05 % cream Commonly known as: RETIN-A Apply topically at bedtime.   urea 10 % cream Commonly known as: CARMOL Apply topically as needed.   Vitamin D3 50 MCG (2000 UT) Tabs Take 1 tablet by mouth daily.       Signature:  Chesley Mires, MD [place] Pager - [phone] 02/27/2021, 10:38 AM

## 2021-02-27 NOTE — Patient Instructions (Signed)
Follow up in 4 months 

## 2021-02-27 NOTE — Telephone Encounter (Signed)
Patient had a video visit this morning with VS. She stated that she had been calling [facility] to check on the status of her cpap and was told that they did not have the order. I verified that the order was placed on January 25th but it was not placed using the correct template. I advised her that I would place the correct order.   Per VS, she is interested in the [place]. Will place this information in the referral as well.

## 2021-02-27 NOTE — Telephone Encounter (Signed)
I have this order and will send to [facility] once VS signs it.  Nothing further needed in message.

## 2021-02-28 ENCOUNTER — Ambulatory Visit: Admission: RE | Admit: 2021-02-28 | Payer: Medicare Other | Source: Ambulatory Visit

## 2021-03-05 ENCOUNTER — Telehealth: Payer: Self-pay | Admitting: Pulmonary Disease

## 2021-03-05 MED ORDER — AMOXICILLIN-POT CLAVULANATE 875-125 MG PO TABS: 1.0000 | ORAL_TABLET | Freq: Two times a day (BID) | ORAL | 0 refills | Status: DC

## 2021-03-05 NOTE — Telephone Encounter (Signed)
Called and spoke with pt who said that she is better than she was after beginning the prednisone and states she is taking last day of 1 pill of the prednisone.  Pt said that she is still coughing but that is better than it was, but she is now coughing up green phlegm. States sometimes her cough is a dry cough but at times she is coughing up phlegm. Pt said she also still has congestion but that is also better than it was. Denies any complaints of wheezing.  Due to her now coughing up green phlegm, pt wants to know if an abx can be prescribed to help with that.  Dr. Halford Chessman, please advise.

## 2021-03-05 NOTE — Telephone Encounter (Signed)
I have called the pt and she is aware of augmentin that has been sent to the pharmacy.  Nothing further is needed.

## 2021-03-05 NOTE — Telephone Encounter (Signed)
Please send script for augmentin 875 one pill bid for 7 days.

## 2021-03-07 ENCOUNTER — Ambulatory Visit: Payer: Medicare Other

## 2021-04-03 ENCOUNTER — Telehealth (HOSPITAL_COMMUNITY): Payer: Self-pay | Admitting: Emergency Medicine

## 2021-04-03 NOTE — Telephone Encounter (Signed)
Reaching out to patient to offer assistance regarding upcoming cardiac imaging study; pt verbalizes understanding of appt date/time, parking situation and where to check in, pre-test NPO status and medications ordered, and verified current allergies; name and call back number provided for further questions should they arise Marchia Bond RN Navigator Cardiac Imaging [facility] [phone] office [phone] cell  100mg  metop tart 2 hr prior to scan. Takes allergy meds before bedtime Clarise Cruz

## 2021-04-04 ENCOUNTER — Other Ambulatory Visit: Payer: Self-pay

## 2021-04-04 ENCOUNTER — Ambulatory Visit
Admission: RE | Admit: 2021-04-04 | Discharge: 2021-04-04 | Disposition: A | Discharge status: Home / Self Care | Payer: Medicare Other | Source: Ambulatory Visit | Attending: Medical | Admitting: Medical

## 2021-04-04 DIAGNOSIS — R072 Precordial pain: Secondary | ICD-10-CM | POA: Insufficient documentation

## 2021-04-04 LAB — POCT I-STAT CREATININE: Creatinine, Ser: 0.7 mg/dL (ref 0.44–1.00)

## 2021-04-04 MED ORDER — IOHEXOL 350 MG/ML SOLN
80.0000 mL | Freq: Once | INTRAVENOUS | Status: AC | PRN
  Administered 2021-04-04: 80 mL via INTRAVENOUS

## 2021-04-04 MED ORDER — METOPROLOL TARTRATE 5 MG/5ML IV SOLN
10.0000 mg | Freq: Once | INTRAVENOUS | Status: AC
  Administered 2021-04-04: 10 mg via INTRAVENOUS

## 2021-04-04 MED ORDER — NITROGLYCERIN 0.4 MG SL SUBL
0.8000 mg | SUBLINGUAL_TABLET | Freq: Once | SUBLINGUAL | Status: AC
  Administered 2021-04-04: 0.8 mg via SUBLINGUAL

## 2021-04-04 NOTE — Progress Notes (Signed)
Patient tolerated procedure well. Ambulate w/o difficulty. Sitting in chair drinking water provided. Encouraged to drink extra water today and reasoning explained. Verbalized understanding. All questions answered. ABC intact. No further needs. Discharge from procedure area w/o issues.  

## 2021-04-05 ENCOUNTER — Telehealth: Payer: Self-pay | Admitting: Cardiovascular Disease

## 2021-04-05 NOTE — Telephone Encounter (Signed)
Results are still pending for the Cardiac CT. Reviewed preliminary findings and will route to provider for final interpretation. She was appreciative for the call with no further questions at this time.

## 2021-04-05 NOTE — Telephone Encounter (Signed)
Paient calling in to review her cardiac ct results

## 2021-04-09 ENCOUNTER — Telehealth: Payer: Self-pay | Admitting: Pulmonary Disease

## 2021-04-09 MED ORDER — FLUCONAZOLE 100 MG PO TABS: 100.0000 mg | ORAL_TABLET | Freq: Every day | ORAL | 0 refills | Status: AC

## 2021-04-09 NOTE — Telephone Encounter (Signed)
Call made to patient, confirmed DOB. Made aware of VS recommendations. Patient aware to STOP Flonase, CONTINUE Azelastine, and pick up diflucan from pharmacy. Pharmacy confirmed. Voiced understanding. Nothing further needed at this time.

## 2021-04-09 NOTE — Telephone Encounter (Signed)
Called and spoke with patient, she needs some clarification on what nasal sprays she should be using.  She was in the office on 01/07/21 and she says you discussed her using the Azelastine spray and the Flonase sensimist.  Then she received Flonase nasal spray in the mail.  She used the Flonase sensimist for a couple of days and then when she received the Flonase nasal spray in the mail she call the office to find out what she was supposed to be taking.  She was told to take the azelastine and Flonase which are both listed on her last AVS.  She currently has thrush as well and needs something to treat it.  Dr. Halford Chessman, please advise.  Thank you.    Prior Versions: 1. Chesley Mires, MD (Physician) at 01/07/2021  3:17 PM - Signed   Azelastine one spray in each nostril twice per day   Flonase sensimist 1 spray in each nostril daily   Stop using dymista      Medication List         Accurate as of Feb 27, 2021 10:38 AM. If you have any questions, ask your nurse or doctor.          STOP taking these medications   diazepam 5 MG tablet Commonly known as: VALIUM Stopped by: Chesley Mires, MD    EPINEPHrine 0.3 mg/0.3 mL Soaj injection Commonly known as: EPI-PEN Stopped by: Chesley Mires, MD       TAKE these medications   AeroChamber MV inhaler Use as instructed    albuterol 108 (90 Base) MCG/ACT inhaler Commonly known as: Proventil HFA Inhale 2 puffs into the lungs every 6 (six) hours as needed.    ALPRAZolam 0.25 MG tablet Commonly known as: XANAX TAKE 1 TABLET (0.25 MG TOTAL) BY MOUTH ONCE DAILY AS NEEDED FOR SLEEP.    atorvastatin 40 MG tablet Commonly known as: LIPITOR Take 1 tablet (40 mg total) by mouth daily.    azelastine 0.1 % nasal spray Commonly known as: ASTELIN Place 1 spray into both nostrils 2 (two) times daily. Use in each nostril as directed    Coenzyme Q10 300 MG Caps Take 1 capsule by mouth daily.    conjugated estrogens vaginal cream Commonly known as:  PREMARIN Place 1 Applicatorful vaginally 2 (two) times a week. 90 day supply,  Refill for one year    cyanocobalamin 1000 MCG/ML injection Commonly known as: (VITAMIN B-12) Inject 1 mL (1,000 mcg total) into the muscle every 30 (thirty) days.    cycloSPORINE 0.05 % ophthalmic emulsion Commonly known as: RESTASIS Place 1 drop into both eyes 2 (two) times daily.    estradiol 0.1 MG/24HR patch Commonly known as: VIVELLE-DOT Place 1 patch (0.1 mg total) onto the skin 2 (two) times a week.    famotidine 20 MG tablet Commonly known as: PEPCID Take 1 tablet (20 mg total) by mouth daily as needed for heartburn or indigestion.    fexofenadine 180 MG tablet Commonly known as: Allegra Allergy Take 1 tablet (180 mg total) by mouth daily.    FLUoxetine 10 MG tablet Commonly known as: PROZAC Take 1 tablet (10 mg total) by mouth daily.    fluticasone 50 MCG/ACT nasal spray Commonly known as: FLONASE Place 2 sprays into both nostrils daily.    levothyroxine 50 MCG tablet Commonly known as: Synthroid Take 1 tablet (50 mcg total) by mouth daily before breakfast.    losartan 50 MG tablet Commonly known as: COZAAR Take 1 tablet (  50 mg total) by mouth at bedtime.    MAGNESIUM PO Take 250 mg by mouth daily.    meclizine 25 MG tablet Commonly known as: ANTIVERT Take 1 tablet (25 mg total) by mouth 3 (three) times daily as needed.    metoprolol succinate 50 MG 24 hr tablet Commonly known as: TOPROL-XL Take 1 tablet (50 mg total) by mouth daily.    metoprolol tartrate 100 MG tablet Commonly known as: LOPRESSOR Take 1 tablet (100 mg) by mouth 2 hours prior to your Cardiac CT    montelukast 10 MG tablet Commonly known as: SINGULAIR Take 1 tablet (10 mg total) by mouth at bedtime.    multivitamin tablet Take 1 tablet by mouth daily.    omega-3 acid ethyl esters 1 g capsule Commonly known as: LOVAZA Take 1 capsule (1 g total) by mouth daily.    ondansetron 4 MG disintegrating  tablet Commonly known as: Zofran ODT Take 1 tablet (4 mg total) by mouth every 8 (eight) hours as needed for nausea or vomiting.    predniSONE 10 MG tablet Commonly known as: DELTASONE Take 4 tablets (40 mg total) by mouth daily with breakfast for 2 days, THEN 3 tablets (30 mg total) daily with breakfast for 2 days, THEN 2 tablets (20 mg total) daily with breakfast for 2 days, THEN 1 tablet (10 mg total) daily with breakfast for 2 days. Start taking on: Feb 26, 2021    Propylene Glycol 0.6 % Soln Apply 1 drop to eye as needed.    psyllium 58.6 % powder Commonly known as: METAMUCIL Take 1 packet by mouth as needed.    Qvar RediHaler 80 MCG/ACT inhaler Generic drug: beclomethasone Inhale 2 puffs into the lungs 2 (two) times daily.    STOOL SOFTENER PO Take 1 tablet by mouth as needed.    SYRINGE 3CC/20GX1" 20G X 1" 3 ML Misc 1 Syringe by Does not apply route every 30 (thirty) days. For use with b-12 injection monthly    tretinoin 0.05 % cream Commonly known as: RETIN-A Apply topically at bedtime.    urea 10 % cream Commonly known as: CARMOL Apply topically as needed.    Vitamin D3 50 MCG (2000 UT) Tabs Take 1 tablet by mouth daily.           Signature:  Chesley Mires, MD [place] Pager - [phone]

## 2021-04-09 NOTE — Telephone Encounter (Signed)
Have her stop flonase.  She should continue azelastine.  Call in script for diflucan 100 mg daily for 7 days.

## 2021-04-15 DIAGNOSIS — S91011A Laceration without foreign body, right ankle, initial encounter: Secondary | ICD-10-CM | POA: Diagnosis not present

## 2021-04-16 ENCOUNTER — Ambulatory Visit: Payer: Medicare Other

## 2021-04-17 ENCOUNTER — Other Ambulatory Visit: Payer: Self-pay

## 2021-04-17 ENCOUNTER — Ambulatory Visit
Admission: RE | Admit: 2021-04-17 | Discharge: 2021-04-17 | Disposition: A | Discharge status: Home / Self Care | Payer: Medicare Other | Source: Ambulatory Visit | Attending: Vascular Surgery | Admitting: Vascular Surgery

## 2021-04-17 DIAGNOSIS — Z1231 Encounter for screening mammogram for malignant neoplasm of breast: Secondary | ICD-10-CM

## 2021-04-19 ENCOUNTER — Ambulatory Visit: Payer: Medicare Other | Admitting: Internal Medicine

## 2021-04-26 DIAGNOSIS — H524 Presbyopia: Secondary | ICD-10-CM | POA: Diagnosis not present

## 2021-05-03 ENCOUNTER — Ambulatory Visit: Payer: Medicare Other | Admitting: Medical

## 2021-05-05 NOTE — Progress Notes (Signed)
Cardiology Office Note  Date:  05/06/2021   ID:  Angela Mendoza, DOB [birth year], MRN [id number]  PCP:  Crecencio Mc, MD   Chief Complaint  Patient presents with   Follow up Cardiac CT     Patient c/o stabbing pains in chest at times. Medications reviewed by the patient verbally.     HPI:  Angela Mendoza is a 70 year old woman with past medical history of Remote atrial fib 2016, details unclear, outside facility report,  Palpitations anxiety OSA, on CPAP Who presents for follow-up of her palpitations, HTN, atrial fib  Last seen by myself: 06/2018 Seen by one of our providers: 01/2021  Lots of bruising on aspirin which he takes several days a week No regular exercise program Denies any significant symptoms concerning for angina She does have occasional sharp discomforts on the left side  Seen in the emergency room 12/29/2020 for chest pain.  Blood pressure was severely elevated.  Troponin was normal and EKG showed no ischemic changes.  She was given IV fluids for dizziness.  CT of the head was negative   Stress at home, husband with medical issues  Echo 12/2020 reviewed 1. Left ventricular ejection fraction, by estimation, is 60 to 65%. The  left ventricle has normal function. The left ventricle has no regional  wall motion abnormalities. Left ventricular diastolic parameters are  consistent with Grade II diastolic  dysfunction (pseudonormalization).   2. Right ventricular systolic function is normal. The right ventricular  size is normal. There is mildly elevated pulmonary artery systolic  pressure. The estimated right ventricular systolic pressure is 62.3 mmHg.   3. The mitral valve is normal in structure. Mild mitral valve  regurgitation.   Cardiac CTA images pulled up and reviewed 1. Coronary calcium score of 0. Patient is low risk for coronary events 2. Normal coronary origin with right dominance. 3. No evidence of CAD. 4. CAD-RADS 0. Consider non-atherosclerotic  causes of chest pain  Has sleep apnea on CPAP Sees Dr. Halford Chessman for asthma  MRI Microvascular changes  Previous issues with breast implants  EKG personally reviewed by myself on todays visit Shows normal sinus rhythm rate 58 bpm , T wave abnormality  PMH:   has a past medical history of [place] coronary artery calcium score less than 100, Allergic rhinitis (06/09/2008), Aortic atherosclerosis ([place]), Asthma, intrinsic (10/06/2011), Chronic headache (06/09/2008), COPD (chronic obstructive pulmonary disease) ([place]), Depression, Diverticulosis (2013), Dry eyes, GERD (gastroesophageal reflux disease), History of breast implant removal, Hot flashes, HYPERLIPIDEMIA (06/09/2008), HYPERTENSION (06/09/2008), Hypothyroidism, MVP (mitral valve prolapse), OSA on CPAP, PAF (paroxysmal atrial fibrillation) ([place]) (06/09/2008), Pernicious anemia, Rosacea, Surgical menopause (1991), Vertigo, and Vitamin D deficiency.  PSH:    Past Surgical History:  Procedure Laterality Date   APPENDECTOMY  2007   BREAST BIOPSY Left 09/27/2009   BREAST ENHANCEMENT SURGERY  2006   CARDIOVASCULAR STRESS TEST  2013   [facility] [name] - WNL, EF 55-60%, with 0 calcium score   CHOLECYSTECTOMY  2007   biliary dyskinesia   COLONOSCOPY  08/2012   diverticulosis   COLONOSCOPY  2006   inflamed hyperplastic polyp   ESOPHAGOGASTRODUODENOSCOPY  08/2012   esophageal reflux   HERNIA REPAIR     NASAL SINUS SURGERY     RECTOCELE REPAIR  12/2008   [name]   sleep study  08/2007   OSA, 12 cm H2O,   TOTAL ABDOMINAL HYSTERECTOMY  1991   heavy bleeding, ovaries removed   TUBAL LIGATION  US ECHOCARDIOGRAPHY  09/2009   impaired relaxation, mild tric regurg, normal MV, EF 60%   US ECHOCARDIOGRAPHY  11/2007   mild-mod MR    Current Outpatient Medications  Medication Sig Dispense Refill   albuterol (PROVENTIL HFA) 108 (90 Base) MCG/ACT inhaler Inhale 2 puffs into the lungs every 6 (six) hours as needed. 18 g 2   ALPRAZolam (XANAX)  0.25 MG tablet TAKE 1 TABLET (0.25 MG TOTAL) BY MOUTH ONCE DAILY AS NEEDED FOR SLEEP. 30 tablet 5   aspirin 81 MG EC tablet Take one tablet 2-3 times a week     atorvastatin (LIPITOR) 40 MG tablet Take 1 tablet (40 mg total) by mouth daily. 90 tablet 3   azelastine (ASTELIN) 0.1 % nasal spray Place 1 spray into both nostrils 2 (two) times daily. Use in each nostril as directed 30 mL 3   beclomethasone (QVAR REDIHALER) 80 MCG/ACT inhaler Inhale 2 puffs into the lungs 2 (two) times daily. 31.8 g 3   Cholecalciferol (VITAMIN D3) 2000 units TABS Take 1 tablet by mouth daily. 90 tablet 3   Coenzyme Q10 (COQ10 PO) Take by mouth daily.     Coenzyme Q10 300 MG CAPS Take 1 capsule by mouth daily.     conjugated estrogens (PREMARIN) vaginal cream Place 1 Applicatorful vaginally 2 (two) times a week. 90 day supply,  Refill for one year 90 g 3   cyanocobalamin (,VITAMIN B-12,) 1000 MCG/ML injection Inject 1 mL (1,000 mcg total) into the muscle every 30 (thirty) days. 3 mL 3   cycloSPORINE (RESTASIS) 0.05 % ophthalmic emulsion Place 1 drop into both eyes 2 (two) times daily. 3 each 3   diazepam (VALIUM) 5 MG tablet Take 5 mg by mouth every 6 (six) hours as needed for anxiety.     Docusate Calcium (STOOL SOFTENER PO) Take 1 tablet by mouth as needed.     EPINEPHrine 0.3 mg/0.3 mL IJ SOAJ injection EpiPen 2-Pak 0.3 mg/0.3 mL injection, auto-injector   0.3 mg by injection route.     estradiol (VIVELLE-DOT) 0.1 MG/24HR patch Place 1 patch (0.1 mg total) onto the skin 2 (two) times a week. 24 patch 3   famotidine (PEPCID) 20 MG tablet Take 1 tablet (20 mg total) by mouth daily as needed for heartburn or indigestion. 90 tablet 3   fexofenadine (ALLEGRA ALLERGY) 180 MG tablet Take 1 tablet (180 mg total) by mouth daily. 90 tablet 3   Fluoxetine HCl, PMDD, 10 MG TABS Take 10 mg by mouth daily.     levothyroxine (SYNTHROID) 50 MCG tablet Take 1 tablet (50 mcg total) by mouth daily before breakfast. 90 tablet 3    losartan (COZAAR) 50 MG tablet Take 1 tablet (50 mg total) by mouth at bedtime. 90 tablet 3   MAGNESIUM PO Take 250 mg by mouth daily.     meclizine (ANTIVERT) 25 MG tablet Take 1 tablet (25 mg total) by mouth 3 (three) times daily as needed. 270 tablet 3   metoprolol succinate (TOPROL-XL) 50 MG 24 hr tablet Take 1 tablet (50 mg total) by mouth daily. 90 tablet 3   montelukast (SINGULAIR) 10 MG tablet Take 1 tablet (10 mg total) by mouth at bedtime. 90 tablet 3   Multiple Vitamin (MULTIVITAMIN) tablet Take 1 tablet by mouth daily.     omega-3 acid ethyl esters (LOVAZA) 1 g capsule Take 1 capsule (1 g total) by mouth daily. 90 capsule 3   ondansetron (ZOFRAN ODT) 4 MG disintegrating tablet Take 1  tablet (4 mg total) by mouth every 8 (eight) hours as needed for nausea or vomiting. 30 tablet 2   Propylene Glycol 0.6 % SOLN Apply 1 drop to eye as needed.     psyllium (METAMUCIL) 58.6 % powder Take 1 packet by mouth as needed.     Spacer/Aero-Holding Chambers (AEROCHAMBER MV) inhaler Use as instructed 1 each 3   Syringe/Needle, Disp, (SYRINGE 3CC/20GX1") 20G X 1" 3 ML MISC 1 Syringe by Does not apply route every 30 (thirty) days. For use with b-12 injection monthly 50 each 0   tretinoin (RETIN-A) 0.05 % cream Apply topically at bedtime. 45 g 1   urea (CARMOL) 10 % cream Apply topically as needed. 71 g 3   amoxicillin-clavulanate (AUGMENTIN) 875-125 MG tablet Take 1 tablet by mouth 2 (two) times daily. (Patient not taking: Reported on 05/06/2021) 14 tablet 0   metoprolol tartrate (LOPRESSOR) 100 MG tablet Take 1 tablet (100 mg) by mouth 2 hours prior to your Cardiac CT (Patient not taking: Reported on 05/06/2021) 1 tablet 0   No current facility-administered medications for this visit.    Allergies:   Codeine and Sulfonamide derivatives   Social History:  The patient  reports that she quit smoking about 43 years ago. Her smoking use included cigarettes. She has a 2.10 pack-year smoking history. She has  never used smokeless tobacco. She reports previous alcohol use. She reports that she does not use drugs.   Family History:   family history includes AAA (abdominal aortic aneurysm) in her paternal uncle; Aortic dissection in her maternal aunt and paternal uncle; Breast cancer in her maternal aunt; Cancer in her cousin and paternal aunt; Cancer (age of onset: 68) in her father; Cancer (age of onset: 63) in her mother; Heart attack in her paternal uncle; Heart disease in her paternal uncle; Heart failure in her sister; Hypertension in her sister; Stroke in her paternal uncle and sister.   Review of Systems: Review of Systems  Constitutional: Negative.   Respiratory: Negative.    Cardiovascular: Negative.   Gastrointestinal: Negative.   Musculoskeletal: Negative.   Neurological: Negative.   Psychiatric/Behavioral:  The patient is nervous/anxious.   All other systems reviewed and are negative.  PHYSICAL EXAM: VS:  BP 130/80 (BP Location: Left Arm, Patient Position: Sitting, Cuff Size: Normal)   Pulse (!) 58   Ht 5\' 5"  (1.651 m)   Wt 182 lb (82.6 kg)   SpO2 98%   BMI 30.29 kg/m  , BMI Body mass index is 30.29 kg/m. Constitutional:  oriented to person, place, and time. No distress.  HENT:  Head: Grossly normal Eyes:  no discharge. No scleral icterus.  Neck: No JVD, no carotid bruits  Cardiovascular: Regular rate and rhythm, no murmurs appreciated Pulmonary/Chest: Clear to auscultation bilaterally, no wheezes or rails Abdominal: Soft.  no distension.  no tenderness.  Musculoskeletal: Normal range of motion Neurological:  normal muscle tone. Coordination normal. No atrophy Skin: Skin warm and dry Psychiatric: normal affect, pleasant  Recent Labs: 05/28/2020: ALT 22; TSH 1.35 12/29/2020: BUN 10; Hemoglobin 13.6; Platelets 298; Potassium 3.7; Sodium 134 04/04/2021: Creatinine, Ser 0.70    Lipid Panel Lab Results  Component Value Date   CHOL 182 05/28/2020   HDL 62.50 05/28/2020    LDLCALC 81 12/08/2019   TRIG 220.0 (H) 05/28/2020    =  Wt Readings from Last 3 Encounters:  05/06/21 182 lb (82.6 kg)  02/13/21 179 lb (81.2 kg)  02/01/21 178 lb (80.7 kg)  ASSESSMENT AND PLAN:  Paroxysmal atrial fibrillation ([place]) Per report noted in 2016, details unclear Previous discussions concerning risk and benefit of anticoagulation No reported episodes since that time We will continue metoprolol succinate, she has heavy bruising, we will stop the aspirin Long discussion concerning monitoring system such as apple watch with software, cardia mobile, monitoring of pulse using other devices  Palpitations -  No atrial fibrillation on monitor, rare ectopy Most triggered events not associated with significant arrhythmia  Chest pain Atypical in nature, cardiac CTA reviewed in detail, no significant disease No further cardiac work-up needed  Anxiety Prior stressors, managed by primary care Has previously tried SSRIs  OSA on CPAP Managed by pulmonary in [place]  Uncomplicated asthma, unspecified asthma severity, unspecified whether persistent Managed by Dr. Llana Aliment   Hypertension Blood pressure is well controlled on today's visit. No changes made to the medications.   Total encounter time more than 25 minutes  Greater than 50% was spent in counseling and coordination of care with the patient      No orders of the defined types were placed in this encounter.    Signed, Esmond Plants, M.D., Ph.D. 05/06/2021  [place], [place]

## 2021-05-06 ENCOUNTER — Ambulatory Visit (INDEPENDENT_AMBULATORY_CARE_PROVIDER_SITE_OTHER): Payer: Medicare Other | Admitting: Cardiovascular Disease

## 2021-05-06 ENCOUNTER — Other Ambulatory Visit: Payer: Self-pay

## 2021-05-06 ENCOUNTER — Encounter: Payer: Self-pay | Admitting: Cardiovascular Disease

## 2021-05-06 VITALS — BP 130/80 | HR 58 | Ht 65.0 in | Wt 182.0 lb

## 2021-05-06 DIAGNOSIS — R002 Palpitations: Secondary | ICD-10-CM

## 2021-05-06 DIAGNOSIS — R079 Chest pain, unspecified: Secondary | ICD-10-CM | POA: Diagnosis not present

## 2021-05-06 DIAGNOSIS — I480 Paroxysmal atrial fibrillation: Secondary | ICD-10-CM

## 2021-05-06 DIAGNOSIS — I10 Essential (primary) hypertension: Secondary | ICD-10-CM

## 2021-05-06 DIAGNOSIS — I059 Rheumatic mitral valve disease, unspecified: Secondary | ICD-10-CM

## 2021-05-06 NOTE — Patient Instructions (Addendum)
Medication Instructions:  Stop aspirin  If you need a refill on your cardiac medications before your next appointment, please call your pharmacy.    Lab work: No new labs needed  Testing/Procedures: No new testing needed   Follow-Up: At [facility], you and your health needs are our priority.  As part of our continuing mission to provide you with exceptional heart care, we have created designated Provider Care Teams.  These Care Teams include your primary Cardiologist (physician) and Advanced Practice Providers (APPs -  Physician Assistants and Nurse Practitioners) who all work together to provide you with the care you need, when you need it.  You will need a follow up appointment in 12 months  Providers on your designated Care Team:   Murray Hodgkins, NP Christell Faith, PA-C Marrianne Mood, PA-C Cadence Morovis, [place]   COVID-19 Vaccine Information can be found at: [email or web address].uk For questions related to vaccine distribution or appointments, please email [email or web address] or call [phone].

## 2021-05-07 DIAGNOSIS — N6012 Diffuse cystic mastopathy of left breast: Secondary | ICD-10-CM | POA: Diagnosis not present

## 2021-05-07 DIAGNOSIS — N6011 Diffuse cystic mastopathy of right breast: Secondary | ICD-10-CM | POA: Diagnosis not present

## 2021-05-09 DIAGNOSIS — H04123 Dry eye syndrome of bilateral lacrimal glands: Secondary | ICD-10-CM

## 2021-05-10 ENCOUNTER — Encounter: Payer: Self-pay | Admitting: Pulmonary Disease

## 2021-05-10 ENCOUNTER — Telehealth: Payer: Self-pay | Admitting: Pulmonary Disease

## 2021-05-10 ENCOUNTER — Ambulatory Visit (INDEPENDENT_AMBULATORY_CARE_PROVIDER_SITE_OTHER): Payer: Medicare Other | Admitting: Pulmonary Disease

## 2021-05-10 ENCOUNTER — Other Ambulatory Visit: Payer: Self-pay

## 2021-05-10 VITALS — BP 124/82 | HR 55 | Temp 97.8°F | Ht 65.0 in | Wt 183.6 lb

## 2021-05-10 DIAGNOSIS — R058 Other specified cough: Secondary | ICD-10-CM | POA: Diagnosis not present

## 2021-05-10 DIAGNOSIS — G4733 Obstructive sleep apnea (adult) (pediatric): Secondary | ICD-10-CM | POA: Diagnosis not present

## 2021-05-10 DIAGNOSIS — J3089 Other allergic rhinitis: Secondary | ICD-10-CM | POA: Diagnosis not present

## 2021-05-10 DIAGNOSIS — Z9989 Dependence on other enabling machines and devices: Secondary | ICD-10-CM | POA: Diagnosis not present

## 2021-05-10 DIAGNOSIS — J302 Other seasonal allergic rhinitis: Secondary | ICD-10-CM

## 2021-05-10 DIAGNOSIS — J4551 Severe persistent asthma with (acute) exacerbation: Secondary | ICD-10-CM | POA: Diagnosis not present

## 2021-05-10 NOTE — Telephone Encounter (Signed)
CPAP 04/02/21 to 05/01/21 >> used on 30 of 30 nights with average 7 hrs 53 min.  Average AHI 0.7 with CPAP 11 cm H2O.   Please let her know that her CPAP report shows good control of sleep apnea on current settings.

## 2021-05-10 NOTE — Assessment & Plan Note (Signed)
Managed with [facility]

## 2021-05-10 NOTE — Progress Notes (Signed)
[facility]  Chief Complaint  Patient presents with   Follow-up    OSA follow up, She has a new machine and brought it to the office to have provider see the machine.      Constitutional:  BP 124/82 (BP Location: Left Arm, Patient Position: Sitting, Cuff Size: Normal)   Pulse (!) 55   Temp 97.8 F (36.6 C) (Oral)   Ht 5\' 5"  (1.651 m)   Wt 183 lb 9.6 oz (83.3 kg)   SpO2 98%   BMI 30.55 kg/m   Past Medical History:  Vit D deficiency, Vertigo, Rosacea, Hypothyroidism, HTN, HLD, GERD, Diverticulosis, Depression, HA, COVID 12 February 2021  Past Surgical History:  She  has a past surgical history that includes Cholecystectomy (2007); Appendectomy (2007); Tubal ligation; Total abdominal hysterectomy (1991); Nasal sinus surgery; Breast enhancement surgery (2006); Cardiovascular stress test (2013); Colonoscopy (08/2012); Esophagogastroduodenoscopy (08/2012); US ECHOCARDIOGRAPHY (09/2009); US ECHOCARDIOGRAPHY (11/2007); sleep study (08/2007); Rectocele repair (12/2008); Colonoscopy (2006); Breast biopsy (Left, 09/27/2009); and Hernia repair.  Brief Summary:  Angela Mendoza is a 70 y.o. female former smoker with OSA and asthma.      Subjective:   Breathing starting to feel better.  No issues with throat.  Not having cough, wheeze, or chest congestion.  Still has some nasal drainage.  Got new Luna CPAP.  Working well.  No issues with mask fit.    Physical Exam:   Appearance - well kempt   ENMT - no sinus tenderness, no oral exudate, no LAN, Mallampati 2 airway, no stridor  Respiratory - equal breath sounds bilaterally, no wheezing or rales  CV - s1s2 regular rate and rhythm, no murmurs  Ext - no clubbing, no edema  Skin - no rashes  Psych - normal mood and affect    Pulmonary testing:  Spirometry 12/12 >> FEV1% 68 RAST 11/15/14 >> IgE 30, cats/mold PFT 02/07/16 >> FEV1 2.32 (92%), FEV1% 76, FEF 25-75% 1.56 (71%), TLC 4.25 (81%),  DLCO 121, borderline BD from FEF 25-75 FeNO 12/01/18 >> 7  Chest Imaging:  Coronary CT 04/04/21 >> stable 2 mm nodule LLL, coronary calcium score 0  Sleep Tests:  PSG 09/06/07 >> AHI 19 CPAP 12/05/20 to 01/03/21 >> used on 30 of 30 nights with average 8 hrs 15 min.  Average AHI 0.6 with CPAP 11 cm H2O  Cardiac Tests:  Echo 01/23/21 >> EF 60 to 65%, grade 2 DD, RVSP 39.1 mmHg, mild MR  Social History:  She  reports that she quit smoking about 43 years ago. Her smoking use included cigarettes. She has a 2.10 pack-year smoking history. She has never used smokeless tobacco. She reports previous alcohol use. She reports that she does not use drugs.  Family History:  Her family history includes AAA (abdominal aortic aneurysm) in her paternal uncle; Aortic dissection in her maternal aunt and paternal uncle; Breast cancer in her maternal aunt; Cancer in her cousin and paternal aunt; Cancer (age of onset: 79) in her father; Cancer (age of onset: 82) in her mother; Heart attack in her paternal uncle; Heart disease in her paternal uncle; Heart failure in her sister; Hypertension in her sister; Stroke in her paternal uncle and sister.     Assessment/Plan:   Allergic asthma. - continue Qvar and albuterol with spacer - continue singulair - gets her scripts through [facility]  Recurrent thrush. - improved - she feels it has been related to azelastine use  - she doesn't  improve with nystatin   Upper airway cough with allergic rhinitis. - dysmista caused funny taste, and azelastine caused throat irritation - prn azelastine - continue singulair - prn allegra, nasal irrigation   Obstructive sleep apnea. - she is compliant with CPAP and reports benefit from therapy - uses [employer] for her DME - continue CPAP 11 cm H2O  Time Spent Involved in Patient Care on Day of Examination:  25 minutes  Follow up:   Patient Instructions  Follow up in 3 to 4 months  Medication List:   Allergies as of  05/10/2021       Reactions   Codeine Other (See Comments)   Low blood pressure/nausea   Sulfonamide Derivatives Rash   rash        Medication List        Accurate as of May 10, 2021  2:10 PM. If you have any questions, ask your nurse or doctor.          STOP taking these medications    amoxicillin-clavulanate 875-125 MG tablet Commonly known as: AUGMENTIN Stopped by: Chesley Mires, MD   metoprolol tartrate 100 MG tablet Commonly known as: LOPRESSOR Stopped by: Chesley Mires, MD       TAKE these medications    AeroChamber MV inhaler Use as instructed   albuterol 108 (90 Base) MCG/ACT inhaler Commonly known as: Proventil HFA Inhale 2 puffs into the lungs every 6 (six) hours as needed.   ALPRAZolam 0.25 MG tablet Commonly known as: XANAX TAKE 1 TABLET (0.25 MG TOTAL) BY MOUTH ONCE DAILY AS NEEDED FOR SLEEP.   atorvastatin 40 MG tablet Commonly known as: LIPITOR Take 1 tablet (40 mg total) by mouth daily.   azelastine 0.1 % nasal spray Commonly known as: ASTELIN Place 1 spray into both nostrils 2 (two) times daily. Use in each nostril as directed   conjugated estrogens vaginal cream Commonly known as: PREMARIN Place 1 Applicatorful vaginally 2 (two) times a week. 90 day supply,  Refill for one year   COQ10 PO Take by mouth daily. What changed: Another medication with the same name was removed. Continue taking this medication, and follow the directions you see here. Changed by: Chesley Mires, MD   cyanocobalamin 1000 MCG/ML injection Commonly known as: (VITAMIN B-12) Inject 1 mL (1,000 mcg total) into the muscle every 30 (thirty) days.   cycloSPORINE 0.05 % ophthalmic emulsion Commonly known as: RESTASIS Place 1 drop into both eyes 2 (two) times daily.   diazepam 5 MG tablet Commonly known as: VALIUM Take 5 mg by mouth every 6 (six) hours as needed for anxiety.   EPINEPHrine 0.3 mg/0.3 mL Soaj injection Commonly known as: EPI-PEN EpiPen 2-Pak 0.3  mg/0.3 mL injection, auto-injector   0.3 mg by injection route.   estradiol 0.1 MG/24HR patch Commonly known as: VIVELLE-DOT Place 1 patch (0.1 mg total) onto the skin 2 (two) times a week.   famotidine 20 MG tablet Commonly known as: PEPCID Take 1 tablet (20 mg total) by mouth daily as needed for heartburn or indigestion.   fexofenadine 180 MG tablet Commonly known as: Allegra Allergy Take 1 tablet (180 mg total) by mouth daily.   Fluoxetine HCl (PMDD) 10 MG Tabs Take 10 mg by mouth daily.   levothyroxine 50 MCG tablet Commonly known as: Synthroid Take 1 tablet (50 mcg total) by mouth daily before breakfast.   losartan 50 MG tablet Commonly known as: COZAAR Take 1 tablet (50 mg total) by mouth at bedtime.  MAGNESIUM PO Take 250 mg by mouth daily.   meclizine 25 MG tablet Commonly known as: ANTIVERT Take 1 tablet (25 mg total) by mouth 3 (three) times daily as needed.   metoprolol succinate 50 MG 24 hr tablet Commonly known as: TOPROL-XL Take 1 tablet (50 mg total) by mouth daily.   montelukast 10 MG tablet Commonly known as: SINGULAIR Take 1 tablet (10 mg total) by mouth at bedtime.   multivitamin tablet Take 1 tablet by mouth daily.   ondansetron 4 MG disintegrating tablet Commonly known as: Zofran ODT Take 1 tablet (4 mg total) by mouth every 8 (eight) hours as needed for nausea or vomiting.   Propylene Glycol 0.6 % Soln Apply 1 drop to eye as needed.   psyllium 58.6 % powder Commonly known as: METAMUCIL Take 1 packet by mouth as needed.   Qvar RediHaler 80 MCG/ACT inhaler Generic drug: beclomethasone Inhale 2 puffs into the lungs 2 (two) times daily.   STOOL SOFTENER PO Take 1 tablet by mouth as needed.   SYRINGE 3CC/20GX1" 20G X 1" 3 ML Misc 1 Syringe by Does not apply route every 30 (thirty) days. For use with b-12 injection monthly   tretinoin 0.05 % cream Commonly known as: RETIN-A Apply topically at bedtime.   urea 10 % cream Commonly  known as: CARMOL Apply topically as needed.   Vitamin D3 50 MCG (2000 UT) Tabs Take 1 tablet by mouth daily.        Signature:  Chesley Mires, MD [place] Pager - [phone] 05/10/2021, 2:10 PM

## 2021-05-10 NOTE — Patient Instructions (Signed)
Follow up in 3 to 4 months 

## 2021-05-13 NOTE — Telephone Encounter (Signed)
ATC patient to go over results, LMTCB

## 2021-05-13 NOTE — Telephone Encounter (Signed)
Pt returning a phone call. Pt can be reached at [phone].

## 2021-05-13 NOTE — Telephone Encounter (Signed)
Called and spoke with patient to go over message from Dr. Halford Chessman about CPAP report. Patient expressed understanding. Nothing further needed at this time.

## 2021-05-16 ENCOUNTER — Telehealth: Payer: Self-pay | Admitting: Pulmonary Disease

## 2021-05-16 NOTE — Telephone Encounter (Signed)
I called and spoke with patient regarding message. Patient stated that her husband, who is with the [employer], was given his third covid booster shot today and [facility] told her to husband that she should as well. Patient wanted to check to see if a third one was needed. She has had 2 first shots and 2 more boosters, last one being done in April 2022 which right after she contracted covid so she is wanting to ask Dr. Halford Chessman if she should get another one. Will route to Dr. Halford Chessman for recs.  Dr. Halford Chessman, please advise on recs. Thanks!

## 2021-05-23 DIAGNOSIS — R040 Epistaxis: Secondary | ICD-10-CM | POA: Diagnosis not present

## 2021-05-24 ENCOUNTER — Ambulatory Visit: Payer: Medicare Other

## 2021-05-27 ENCOUNTER — Ambulatory Visit (INDEPENDENT_AMBULATORY_CARE_PROVIDER_SITE_OTHER): Payer: Medicare Other

## 2021-05-27 VITALS — Ht 65.0 in | Wt 183.0 lb

## 2021-05-27 DIAGNOSIS — Z0000 Encounter for general adult medical examination without abnormal findings: Secondary | ICD-10-CM

## 2021-05-27 NOTE — Progress Notes (Addendum)
Subjective:   Angela Mendoza is a 70 y.o. female who presents for Medicare Annual (Subsequent) preventive examination.  Review of Systems    No ROS.  Medicare Wellness Virtual Visit.  Visual/audio telehealth visit, UTA vital signs.   See social history for additional risk factors.   Cardiac Risk Factors include: advanced age (>66mn, >>78women)     Objective:    Today's Vitals   05/27/21 1521  Weight: 183 lb (83 kg)  Height: '5\' 5"'$  (1.651 m)   Body mass index is 30.45 kg/m.  Advanced Directives 05/27/2021 12/29/2020 05/23/2020 05/23/2019 05/20/2018  Does Patient Have a Medical Advance Directive? No No No No No  Would patient like information on creating a medical advance directive? No - Patient declined No - Patient declined No - Patient declined Yes (MAU/Ambulatory/Procedural Areas - Information given) Yes (MAU/Ambulatory/Procedural Areas - Information given)    Current Medications (verified) Outpatient Encounter Medications as of 05/27/2021  Medication Sig   albuterol (PROVENTIL HFA) 108 (90 Base) MCG/ACT inhaler Inhale 2 puffs into the lungs every 6 (six) hours as needed.   ALPRAZolam (XANAX) 0.25 MG tablet TAKE 1 TABLET (0.25 MG TOTAL) BY MOUTH ONCE DAILY AS NEEDED FOR SLEEP.   atorvastatin (LIPITOR) 40 MG tablet Take 1 tablet (40 mg total) by mouth daily.   azelastine (ASTELIN) 0.1 % nasal spray Place 1 spray into both nostrils 2 (two) times daily. Use in each nostril as directed   beclomethasone (QVAR REDIHALER) 80 MCG/ACT inhaler Inhale 2 puffs into the lungs 2 (two) times daily.   Cholecalciferol (VITAMIN D3) 2000 units TABS Take 1 tablet by mouth daily.   Coenzyme Q10 (COQ10 PO) Take by mouth daily.   conjugated estrogens (PREMARIN) vaginal cream Place 1 Applicatorful vaginally 2 (two) times a week. 90 day supply,  Refill for one year   cyanocobalamin (,VITAMIN B-12,) 1000 MCG/ML injection Inject 1 mL (1,000 mcg total) into the muscle every 30 (thirty) days.   cycloSPORINE  (RESTASIS) 0.05 % ophthalmic emulsion Place 1 drop into both eyes 2 (two) times daily.   diazepam (VALIUM) 5 MG tablet Take 5 mg by mouth every 6 (six) hours as needed for anxiety.   Docusate Calcium (STOOL SOFTENER PO) Take 1 tablet by mouth as needed.   EPINEPHrine 0.3 mg/0.3 mL IJ SOAJ injection EpiPen 2-Pak 0.3 mg/0.3 mL injection, auto-injector   0.3 mg by injection route.   estradiol (VIVELLE-DOT) 0.1 MG/24HR patch Place 1 patch (0.1 mg total) onto the skin 2 (two) times a week.   famotidine (PEPCID) 20 MG tablet Take 1 tablet (20 mg total) by mouth daily as needed for heartburn or indigestion.   fexofenadine (ALLEGRA ALLERGY) 180 MG tablet Take 1 tablet (180 mg total) by mouth daily.   Fluoxetine HCl, PMDD, 10 MG TABS Take 10 mg by mouth daily.   levothyroxine (SYNTHROID) 50 MCG tablet Take 1 tablet (50 mcg total) by mouth daily before breakfast.   losartan (COZAAR) 50 MG tablet Take 1 tablet (50 mg total) by mouth at bedtime.   MAGNESIUM PO Take 250 mg by mouth daily.   meclizine (ANTIVERT) 25 MG tablet Take 1 tablet (25 mg total) by mouth 3 (three) times daily as needed.   metoprolol succinate (TOPROL-XL) 50 MG 24 hr tablet Take 1 tablet (50 mg total) by mouth daily.   montelukast (SINGULAIR) 10 MG tablet Take 1 tablet (10 mg total) by mouth at bedtime.   Multiple Vitamin (MULTIVITAMIN) tablet Take 1 tablet by mouth daily.  ondansetron (ZOFRAN ODT) 4 MG disintegrating tablet Take 1 tablet (4 mg total) by mouth every 8 (eight) hours as needed for nausea or vomiting.   Propylene Glycol 0.6 % SOLN Apply 1 drop to eye as needed.   psyllium (METAMUCIL) 58.6 % powder Take 1 packet by mouth as needed.   Spacer/Aero-Holding Chambers (AEROCHAMBER MV) inhaler Use as instructed   Syringe/Needle, Disp, (SYRINGE 3CC/20GX1") 20G X 1" 3 ML MISC 1 Syringe by Does not apply route every 30 (thirty) days. For use with b-12 injection monthly   tretinoin (RETIN-A) 0.05 % cream Apply topically at bedtime.    urea (CARMOL) 10 % cream Apply topically as needed.   No facility-administered encounter medications on file as of 05/27/2021.    Allergies (verified) Codeine and Sulfonamide derivatives   History: Past Medical History:  Diagnosis Date   Agatston coronary artery calcium score less than 100    a. 06/2018 Cardiac CT: Ca2+ = 3 (49th %'ile).   Allergic rhinitis 06/09/2008   chronic   Aortic atherosclerosis ([place])    a. 06/2018 noted on chest CT.   Asthma, intrinsic 10/06/2011   controlled on qvar. [name] 09/2011:  Very mild airflow obstruction (FEV1% 68, FVL c/w obstruction)    Chronic headache 06/09/2008   COPD (chronic obstructive pulmonary disease) ([place])    Depression    Diverticulosis 2013   h/o diverticulitis   Dry eyes    GERD (gastroesophageal reflux disease)    History of breast implant removal    silicone mastitis - R axilla silicone LN, free silicone L breast upper outer quadrant   Hot flashes    HYPERLIPIDEMIA 06/09/2008   HYPERTENSION 06/09/2008   Hypothyroidism    MVP (mitral valve prolapse)    OSA on CPAP    [name]   PAF (paroxysmal atrial fibrillation) ([place]) 06/09/2008   a. CHA2DS2VASc = 3-->prefers ASA.   Pernicious anemia    per prior pcp   Rosacea    Surgical menopause 1991   Vertigo    Vitamin D deficiency    Past Surgical History:  Procedure Laterality Date   APPENDECTOMY  2007   BREAST BIOPSY Left 09/27/2009   BREAST ENHANCEMENT SURGERY  2006   CARDIOVASCULAR STRESS TEST  2013   [place] [name] - WNL, EF 55-60%, with 0 calcium score   CHOLECYSTECTOMY  2007   biliary dyskinesia   COLONOSCOPY  08/2012   diverticulosis   COLONOSCOPY  2006   inflamed hyperplastic polyp   ESOPHAGOGASTRODUODENOSCOPY  08/2012   esophageal reflux   HERNIA REPAIR     NASAL SINUS SURGERY     RECTOCELE REPAIR  12/2008   [name]   sleep study  08/2007   OSA, 12 cm H2O,   TOTAL ABDOMINAL HYSTERECTOMY  1991   heavy bleeding, ovaries removed   TUBAL LIGATION     US  ECHOCARDIOGRAPHY  09/2009   impaired relaxation, mild tric regurg, normal MV, EF 60%   US ECHOCARDIOGRAPHY  11/2007   mild-mod MR   Family History  Problem Relation Age of Onset   Cancer Mother 49       lung, passive smoke   Cancer Father 36       lung, smoker   Cancer Paternal Aunt        breast, lung   Cancer Cousin        breast   Stroke Sister        TIA   Hypertension Sister    Heart failure Sister  Aortic dissection Paternal Uncle    Heart attack Paternal Uncle    AAA (abdominal aortic aneurysm) Paternal Uncle    Aortic dissection Maternal Aunt    Breast cancer Maternal Aunt    Heart disease Paternal Uncle    Stroke Paternal Uncle    Social History   Socioeconomic History   Marital status: Married    Spouse name: Not on file   Number of children: Not on file   Years of education: Not on file   Highest education level: Not on file  Occupational History   Occupation: retired  Tobacco Use   Smoking status: Former    Packs/day: 0.30    Years: 7.00    Pack years: 2.10    Types: Cigarettes    Quit date: 10/27/1977    Years since quitting: 43.6   Smokeless tobacco: Never   Tobacco comments:    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 Hx of children who are grown  Vaping Use   Vaping Use: Never used  Substance and Sexual Activity   Alcohol use: Not Currently    Alcohol/week: 0.0 standard drinks   Drug use: No   Sexual activity: Not on file  Other Topics Concern   Not on file  Social History Narrative   Lives with husband    Grown children   Occ: retired, was Gaffer   Edu: trade degree then 2 yrscollege   Social Determinants of Radio broadcast assistant Strain: Low Risk    Difficulty of Paying Living Expenses: Not hard at all  Food Insecurity: No Food Insecurity   Worried About Charity fundraiser in the Last Year: Never true   Arboriculturist in the Last Year: Never true  Transportation Needs: No Transportation Needs   Lack  of Transportation (Medical): No   Lack of Transportation (Non-Medical): No  Physical Activity: Insufficiently Active   Days of Exercise per Week: 3 days   Minutes of Exercise per Session: 20 min  Stress: No Stress Concern Present   Feeling of Stress : Not at all  Social Connections: Unknown   Frequency of Communication with Friends and Family: Not on file   Frequency of Social Gatherings with Friends and Family: More than three times a week   Attends Religious Services: Not on Electrical engineer or Organizations: Not on file   Attends Archivist Meetings: Not on file   Marital Status: Married    Tobacco Counseling Counseling given: Not Answered Tobacco comments: 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 Hx of children who are grown   Clinical Intake:  Pre-visit preparation completed: Yes           How often do you need to have someone help you when you read instructions, pamphlets, or other written materials from your doctor or pharmacy?: 1 - Never  Interpreter Needed?: No      Activities of Daily Living In your present state of health, do you have any difficulty performing the following activities: 05/27/2021  Hearing? N  Vision? N  Difficulty concentrating or making decisions? N  Walking or climbing stairs? N  Dressing or bathing? N  Doing errands, shopping? N  Preparing Food and eating ? N  Using the Toilet? N  In the past six months, have you accidently leaked urine? N  Do you have problems with loss of bowel control? N  Managing your Medications? N  Managing your Finances?  N  Housekeeping or managing your Housekeeping? N  Some recent data might be hidden    Patient Care Team: Crecencio Mc, MD as PCP - General (Internal Medicine)  Indicate any recent Medical Services you may have received from other than [facility] providers in the past year (date may be approximate).     Assessment:   This is a routine wellness  examination for [place].  I connected with Kindy today by telephone and verified that I am speaking with the correct person using two identifiers. Location patient: home Location provider: work Persons participating in the virtual visit: patient, Marine scientist.    I discussed the limitations, risks, security and privacy concerns of performing an evaluation and management service by telephone and the availability of in person appointments. The patient expressed understanding and verbally consented to this telephonic visit.    Interactive audio and video telecommunications were attempted between this provider and patient, however failed, due to patient having technical difficulties OR patient did not have access to video capability.  We continued and completed visit with audio only.  Some vital signs may be absent or patient reported.   Hearing/Vision screen Hearing Screening - Comments:: Patient is able to hear conversational tones without difficulty.  No issues reported. Vision Screening - Comments:: Wears corrective lenses    Dietary issues and exercise activities discussed: Current Exercise Habits: Home exercise routine, Type of exercise: walking, Intensity: Mild Healthy diet Good water intake   Goals Addressed               This Visit's Progress     Patient Stated     Id like to increase physical activity (pt-stated)        Other     Reduce sugar intake        Portion control        Depression Screen PHQ 2/9 Scores 05/27/2021 02/13/2021 05/23/2020 11/07/2019 05/23/2019 05/20/2018 05/14/2017  PHQ - 2 Score 0 2 0 0 0 0 0  PHQ- 9 Score - 4 - - - - -    Fall Risk Fall Risk  05/27/2021 02/13/2021 01/16/2021 10/16/2020 05/28/2020  Falls in the past year? 0 0 0 1 0  Comment - - - - -  Number falls in past yr: - - - 0 -  Comment - - - - -  Injury with Fall? 0 - - 1 -  Comment - - - - -  Risk for fall due to : - - - History of fall(s) -  Follow up Falls evaluation completed Falls  evaluation completed Falls evaluation completed Falls evaluation completed Falls evaluation completed    [place]: Adequate lighting in your home to reduce risk of falls? Yes   ASSISTIVE DEVICES UTILIZED TO PREVENT FALLS: Use of a cane, walker or w/c? No   TIMED UP AND GO: Was the test performed? No .   Cognitive Function: Patient is alert and oriented x3. Denies difficulty focusing, making decisions, memory loss.  MMSE/6CIT deferred. Normal by direct communication/observation.  MMSE - Mini Mental State Exam 05/23/2020 05/20/2018  Not completed: Unable to complete -  Orientation to time - 5  Orientation to Place - 5  Registration - 3  Attention/ Calculation - 5  Recall - 3  Language- name 2 objects - 2  Language- repeat - 1  Language- follow 3 step command - 3  Language- read & follow direction - 1  Write a sentence - 1  Copy design - 1  Total score - 30     6CIT Screen 05/23/2019  What Year? 0 points  What month? 0 points  What time? 0 points  Count back from 20 0 points  Months in reverse 0 points  Repeat phrase 0 points  Total Score 0    Immunizations Immunization History  Administered Date(s) Administered   Fluad Quad(high Dose 65+) 07/16/2019, 07/20/2020   Influenza Split 07/15/2013, 07/12/2015, 07/14/2016, 08/04/2017   Influenza Whole 07/27/2009, 07/28/2011, 07/21/2012   Influenza, High Dose Seasonal PF 07/22/2017, 07/01/2018   Influenza,inj,Quad PF,6+ Mos 08/03/2014, 07/12/2015, 07/14/2016   Influenza-Unspecified 07/15/2013, 08/03/2014, 07/12/2015, 07/14/2016, 08/04/2017   Moderna SARS-COV2 Booster Vaccination 08/17/2020, 02/01/2021   Moderna Sars-Covid-2 Vaccination 12/11/2019, 01/08/2020   Pneumococcal Conjugate-13 11/08/2014   Pneumococcal Polysaccharide-23 07/27/2009, 12/27/2015   Td 10/27/2008   Tdap 12/19/2013, 02/25/2017   Tetanus 10/27/2008   Zoster Recombinat (Shingrix) 04/27/2020, 11/26/2020   Zoster, Live  10/27/2009    Health Maintenance  There are no preventive care reminders to display for this patient. Health Maintenance  Topic Date Due   INFLUENZA VACCINE  06/27/2021 (Originally 05/27/2021)   COVID-19 Vaccine (5 - Booster for Moderna series) 06/03/2021   COLONOSCOPY (Pts 45-67yr Insurance coverage will need to be confirmed)  09/02/2022   MAMMOGRAM  04/18/2023   TETANUS/TDAP  02/26/2027   DEXA SCAN  Completed   Hepatitis C Screening  Completed   PNA vac Low Risk Adult  Completed   Zoster Vaccines- Shingrix  Completed   HPV VACCINES  Aged Out   Lung Cancer Screening: (Low Dose CT Chest recommended if Age 70-80years, 30 pack-year currently smoking OR have quit w/in 15years.) does not qualify.   Vision Screening: Recommended annual ophthalmology exams for early detection of glaucoma and other disorders of the eye.  Dental Screening: Recommended annual dental exams for proper oral hygiene  Community Resource Referral / Chronic Care Management: CRR required this visit?  No   CCM required this visit?  No      Plan:   Keep all routine maintenance appointments.   I have personally reviewed and noted the following in the patient's chart:   Medical and social history Use of alcohol, tobacco or illicit drugs  Current medications and supplements including opioid prescriptions. Patient is not currently taking opioid.  Functional ability and status Nutritional status Physical activity Advanced directives List of other physicians Hospitalizations, surgeries, and ER visits in previous 12 months Vitals Screenings to include cognitive, depression, and falls Referrals and appointments  In addition, I have reviewed and discussed with patient certain preventive protocols, quality metrics, and best practice recommendations. A written personalized care plan for preventive services as well as general preventive health recommendations were provided to patient via mychart.      OBrien-Blaney, Moishy Laday L, LPN   [id number]    I have reviewed the above information and agree with above.   TDeborra Medina MD

## 2021-05-27 NOTE — Telephone Encounter (Signed)
Spoke with the pt and notified of response per Dr Halford Chessman  She verbalized understanding and nothing further needed

## 2021-05-27 NOTE — Patient Instructions (Addendum)
[name] , Thank you for taking time to come for your Medicare Wellness Visit. I appreciate your ongoing commitment to your health goals. Please review the following plan we discussed and let me know if I can assist you in the future.   These are the goals we discussed:  Goals       Patient Stated     Id like to increase physical activity (pt-stated)      Other     Reduce sugar intake      Portion control         This is a list of the screening recommended for you and due dates:  Health Maintenance  Topic Date Due   Flu Shot  06/27/2021*   COVID-19 Vaccine (5 - Booster for Moderna series) 06/03/2021   Colon Cancer Screening  09/02/2022   Mammogram  04/18/2023   Tetanus Vaccine  02/26/2027   DEXA scan (bone density measurement)  Completed   Hepatitis C Screening: USPSTF Recommendation to screen - Ages 26-79 yo.  Completed   Pneumonia vaccines  Completed   Zoster (Shingles) Vaccine  Completed   HPV Vaccine  Aged Out  *Topic was postponed. The date shown is not the original due date.    Conditions/risks identified: discuss bruising and referral for mohs surgery with pcp next visit. Appointment noted.  Follow up in one year for your annual wellness visit    Preventive Care 65 Years and Older, Female Preventive care refers to lifestyle choices and visits with your health care provider that can promote health and wellness. What does preventive care include? A yearly physical exam. This is also called an annual well check. Dental exams once or twice a year. Routine eye exams. Ask your health care provider how often you should have your eyes checked. Personal lifestyle choices, including: Daily care of your teeth and gums. Regular physical activity. Eating a healthy diet. Avoiding tobacco and drug use. Limiting alcohol use. Practicing safe sex. Taking low-dose aspirin every day. Taking vitamin and mineral supplements as recommended by your health care provider. What  happens during an annual well check? The services and screenings done by your health care provider during your annual well check will depend on your age, overall health, lifestyle risk factors, and family history of disease. Counseling  Your health care provider may ask you questions about your: Alcohol use. Tobacco use. Drug use. Emotional well-being. Home and relationship well-being. Sexual activity. Eating habits. History of falls. Memory and ability to understand (cognition). Work and work Statistician. Reproductive health. Screening  You may have the following tests or measurements: Height, weight, and BMI. Blood pressure. Lipid and cholesterol levels. These may be checked every 5 years, or more frequently if you are over 29 years old. Skin check. Lung cancer screening. You may have this screening every year starting at age 1 if you have a 30-pack-year history of smoking and currently smoke or have quit within the past 15 years. Fecal occult blood test (FOBT) of the stool. You may have this test every year starting at age 72. Flexible sigmoidoscopy or colonoscopy. You may have a sigmoidoscopy every 5 years or a colonoscopy every 10 years starting at age 59. Hepatitis C blood test. Hepatitis B blood test. Sexually transmitted disease (STD) testing. Diabetes screening. This is done by checking your blood sugar (glucose) after you have not eaten for a while (fasting). You may have this done every 1-3 years. Bone density scan. This is done to screen for  osteoporosis. You may have this done starting at age 22. Mammogram. This may be done every 1-2 years. Talk to your health care provider about how often you should have regular mammograms. Talk with your health care provider about your test results, treatment options, and if necessary, the need for more tests. Vaccines  Your health care provider may recommend certain vaccines, such as: Influenza vaccine. This is recommended every  year. Tetanus, diphtheria, and acellular pertussis (Tdap, Td) vaccine. You may need a Td booster every 10 years. Zoster vaccine. You may need this after age 41. Pneumococcal 13-valent conjugate (PCV13) vaccine. One dose is recommended after age 55. Pneumococcal polysaccharide (PPSV23) vaccine. One dose is recommended after age 35. Talk to your health care provider about which screenings and vaccines you need and how often you need them. This information is not intended to replace advice given to you by your health care provider. Make sure you discuss any questions you have with your health care provider. Document Released: 11/09/2015 Document Revised: 07/02/2016 Document Reviewed: 08/14/2015 Elsevier Interactive Patient Education  2017 [place] Prevention in the Home Falls can cause injuries. They can happen to people of all ages. There are many things you can do to make your home safe and to help prevent falls. What can I do on the outside of my home? Regularly fix the edges of walkways and driveways and fix any cracks. Remove anything that might make you trip as you walk through a door, such as a raised step or threshold. Trim any bushes or trees on the path to your home. Use bright outdoor lighting. Clear any walking paths of anything that might make someone trip, such as rocks or tools. Regularly check to see if handrails are loose or broken. Make sure that both sides of any steps have handrails. Any raised decks and porches should have guardrails on the edges. Have any leaves, snow, or ice cleared regularly. Use sand or salt on walking paths during winter. Clean up any spills in your garage right away. This includes oil or grease spills. What can I do in the bathroom? Use night lights. Install grab bars by the toilet and in the tub and shower. Do not use towel bars as grab bars. Use non-skid mats or decals in the tub or shower. If you need to sit down in the shower, use a  plastic, non-slip stool. Keep the floor dry. Clean up any water that spills on the floor as soon as it happens. Remove soap buildup in the tub or shower regularly. Attach bath mats securely with double-sided non-slip rug tape. Do not have throw rugs and other things on the floor that can make you trip. What can I do in the bedroom? Use night lights. Make sure that you have a light by your bed that is easy to reach. Do not use any sheets or blankets that are too big for your bed. They should not hang down onto the floor. Have a firm chair that has side arms. You can use this for support while you get dressed. Do not have throw rugs and other things on the floor that can make you trip. What can I do in the kitchen? Clean up any spills right away. Avoid walking on wet floors. Keep items that you use a lot in easy-to-reach places. If you need to reach something above you, use a strong step stool that has a grab bar. Keep electrical cords out of the way. Do not  use floor polish or wax that makes floors slippery. If you must use wax, use non-skid floor wax. Do not have throw rugs and other things on the floor that can make you trip. What can I do with my stairs? Do not leave any items on the stairs. Make sure that there are handrails on both sides of the stairs and use them. Fix handrails that are broken or loose. Make sure that handrails are as long as the stairways. Check any carpeting to make sure that it is firmly attached to the stairs. Fix any carpet that is loose or worn. Avoid having throw rugs at the top or bottom of the stairs. If you do have throw rugs, attach them to the floor with carpet tape. Make sure that you have a light switch at the top of the stairs and the bottom of the stairs. If you do not have them, ask someone to add them for you. What else can I do to help prevent falls? Wear shoes that: Do not have high heels. Have rubber bottoms. Are comfortable and fit you  well. Are closed at the toe. Do not wear sandals. If you use a stepladder: Make sure that it is fully opened. Do not climb a closed stepladder. Make sure that both sides of the stepladder are locked into place. Ask someone to hold it for you, if possible. Clearly mark and make sure that you can see: Any grab bars or handrails. First and last steps. Where the edge of each step is. Use tools that help you move around (mobility aids) if they are needed. These include: Canes. Walkers. Scooters. Crutches. Turn on the lights when you go into a dark area. Replace any light bulbs as soon as they burn out. Set up your furniture so you have a clear path. Avoid moving your furniture around. If any of your floors are uneven, fix them. If there are any pets around you, be aware of where they are. Review your medicines with your doctor. Some medicines can make you feel dizzy. This can increase your chance of falling. Ask your doctor what other things that you can do to help prevent falls. This information is not intended to replace advice given to you by your health care provider. Make sure you discuss any questions you have with your health care provider. Document Released: 08/09/2009 Document Revised: 03/20/2016 Document Reviewed: 11/17/2014 Elsevier Interactive Patient Education  2017 [employer].

## 2021-05-27 NOTE — Telephone Encounter (Signed)
If she has received the initial two vaccinations plus two boosters, there is no need for an additional booster at this time.  There will likely be an updated vaccine coming out in the Fall, but this hasn't been finalized yet.

## 2021-05-29 ENCOUNTER — Other Ambulatory Visit: Payer: Self-pay

## 2021-05-29 ENCOUNTER — Telehealth: Payer: Self-pay | Admitting: Internal Medicine

## 2021-05-29 ENCOUNTER — Ambulatory Visit (INDEPENDENT_AMBULATORY_CARE_PROVIDER_SITE_OTHER): Payer: Medicare Other | Admitting: Internal Medicine

## 2021-05-29 VITALS — BP 126/70 | HR 59 | Temp 97.2°F | Ht 65.0 in | Wt 182.4 lb

## 2021-05-29 DIAGNOSIS — G4701 Insomnia due to medical condition: Secondary | ICD-10-CM

## 2021-05-29 DIAGNOSIS — R040 Epistaxis: Secondary | ICD-10-CM

## 2021-05-29 DIAGNOSIS — E034 Atrophy of thyroid (acquired): Secondary | ICD-10-CM | POA: Diagnosis not present

## 2021-05-29 DIAGNOSIS — C44311 Basal cell carcinoma of skin of nose: Secondary | ICD-10-CM

## 2021-05-29 DIAGNOSIS — G4733 Obstructive sleep apnea (adult) (pediatric): Secondary | ICD-10-CM

## 2021-05-29 DIAGNOSIS — D692 Other nonthrombocytopenic purpura: Secondary | ICD-10-CM

## 2021-05-29 DIAGNOSIS — U071 COVID-19: Secondary | ICD-10-CM

## 2021-05-29 DIAGNOSIS — D699 Hemorrhagic condition, unspecified: Secondary | ICD-10-CM | POA: Diagnosis not present

## 2021-05-29 DIAGNOSIS — Z780 Asymptomatic menopausal state: Secondary | ICD-10-CM

## 2021-05-29 DIAGNOSIS — E782 Mixed hyperlipidemia: Secondary | ICD-10-CM

## 2021-05-29 DIAGNOSIS — E559 Vitamin D deficiency, unspecified: Secondary | ICD-10-CM

## 2021-05-29 DIAGNOSIS — K2100 Gastro-esophageal reflux disease with esophagitis, without bleeding: Secondary | ICD-10-CM | POA: Diagnosis not present

## 2021-05-29 DIAGNOSIS — Z9989 Dependence on other enabling machines and devices: Secondary | ICD-10-CM

## 2021-05-29 DIAGNOSIS — F411 Generalized anxiety disorder: Secondary | ICD-10-CM

## 2021-05-29 DIAGNOSIS — I10 Essential (primary) hypertension: Secondary | ICD-10-CM

## 2021-05-29 LAB — PROTIME-INR
INR: 1 ratio (ref 0.8–1.0)
Prothrombin Time: 10.9 s (ref 9.6–13.1)

## 2021-05-29 LAB — COMPREHENSIVE METABOLIC PANEL
ALT: 27 U/L (ref 0–35)
AST: 22 U/L (ref 0–37)
Albumin: 4.4 g/dL (ref 3.5–5.2)
Alkaline Phosphatase: 65 U/L (ref 39–117)
BUN: 10 mg/dL (ref 6–23)
CO2: 28 mEq/L (ref 19–32)
Calcium: 9.3 mg/dL (ref 8.4–10.5)
Chloride: 100 mEq/L (ref 96–112)
Creatinine, Ser: 0.84 mg/dL (ref 0.40–1.20)
GFR: 70.35 mL/min (ref >60.00)
Glucose, Bld: 99 mg/dL (ref 70–99)
Potassium: 4.3 mEq/L (ref 3.5–5.1)
Sodium: 136 mEq/L (ref 135–145)
Total Bilirubin: 1.2 mg/dL (ref 0.2–1.2)
Total Protein: 7.1 g/dL (ref 6.0–8.3)

## 2021-05-29 LAB — LIPID PANEL
Cholesterol: 148 mg/dL (ref 0–200)
HDL: 51.8 mg/dL (ref >39.00)
LDL Cholesterol: 71 mg/dL (ref 0–99)
NonHDL: 96.42
Total CHOL/HDL Ratio: 3
Triglycerides: 127 mg/dL (ref 0.0–149.0)
VLDL: 25.4 mg/dL (ref 0.0–40.0)

## 2021-05-29 LAB — VITAMIN D 25 HYDROXY (VIT D DEFICIENCY, FRACTURES): VITD: 33.69 ng/mL (ref 30.00–100.00)

## 2021-05-29 LAB — TSH: TSH: 1.99 u[IU]/mL (ref 0.35–5.50)

## 2021-05-29 LAB — C-REACTIVE PROTEIN: CRP: <1 mg/dL (ref 0.5–20.0)

## 2021-05-29 LAB — APTT: aPTT: 34.8 s — ABNORMAL HIGH (ref 23.4–32.7)

## 2021-05-29 LAB — SEDIMENTATION RATE: Sed Rate: 17 mm/hr (ref 0–30)

## 2021-05-29 MED ORDER — ALBUTEROL SULFATE HFA 108 (90 BASE) MCG/ACT IN AERS: 2.0000 | INHALATION_SPRAY | Freq: Four times a day (QID) | RESPIRATORY_TRACT | 2 refills | Status: DC | PRN

## 2021-05-29 MED ORDER — DIAZEPAM 5 MG PO TABS: 5.0000 mg | ORAL_TABLET | Freq: Four times a day (QID) | ORAL | 0 refills | Status: DC | PRN

## 2021-05-29 MED ORDER — FLUOXETINE HCL (PMDD) 10 MG PO TABS
10.0000 mg | ORAL_TABLET | Freq: Every day | ORAL | 1 refills | Status: DC
Start: 2021-05-29 — End: 2022-02-03

## 2021-05-29 MED ORDER — ALPRAZOLAM 0.25 MG PO TABS: ORAL_TABLET | ORAL | 5 refills | Status: DC

## 2021-05-29 MED ORDER — AZELASTINE HCL 0.1 % NA SOLN: 1.0000 | Freq: Two times a day (BID) | NASAL | 3 refills | Status: DC

## 2021-05-29 MED ORDER — ATORVASTATIN CALCIUM 40 MG PO TABS: 40.0000 mg | ORAL_TABLET | Freq: Every day | ORAL | 3 refills | Status: DC

## 2021-05-29 MED ORDER — CYANOCOBALAMIN 1000 MCG/ML IJ SOLN: 1000.0000 ug | INTRAMUSCULAR | 3 refills | Status: DC

## 2021-05-29 MED ORDER — ESTROGENS, CONJUGATED 0.625 MG/GM VA CREA: 1.0000 | TOPICAL_CREAM | VAGINAL | 3 refills | Status: DC

## 2021-05-29 MED ORDER — LEVOTHYROXINE SODIUM 50 MCG PO TABS: 50.0000 ug | ORAL_TABLET | Freq: Every day | ORAL | 3 refills | Status: DC

## 2021-05-29 MED ORDER — QVAR REDIHALER 80 MCG/ACT IN AERB: 2.0000 | INHALATION_SPRAY | Freq: Two times a day (BID) | RESPIRATORY_TRACT | 3 refills | Status: DC

## 2021-05-29 MED ORDER — "SYRINGE 20G X 1"" 3 ML MISC": 1.0000 | 0 refills | Status: DC

## 2021-05-29 MED ORDER — ESTRADIOL 0.1 MG/24HR TD PTTW: 1.0000 | MEDICATED_PATCH | TRANSDERMAL | 3 refills | Status: DC

## 2021-05-29 MED ORDER — MONTELUKAST SODIUM 10 MG PO TABS: 10.0000 mg | ORAL_TABLET | Freq: Every day | ORAL | 3 refills | Status: DC

## 2021-05-29 MED ORDER — FAMOTIDINE 20 MG PO TABS: 20.0000 mg | ORAL_TABLET | Freq: Every day | ORAL | 3 refills | Status: DC | PRN

## 2021-05-29 MED ORDER — MECLIZINE HCL 25 MG PO TABS: 25.0000 mg | ORAL_TABLET | Freq: Three times a day (TID) | ORAL | 3 refills | Status: DC | PRN

## 2021-05-29 MED ORDER — VITAMIN D3 50 MCG (2000 UT) PO TABS: 1.0000 | ORAL_TABLET | Freq: Every day | ORAL | 3 refills | Status: AC

## 2021-05-29 MED ORDER — METOPROLOL SUCCINATE ER 50 MG PO TB24: 50.0000 mg | ORAL_TABLET | Freq: Every day | ORAL | 3 refills | Status: DC

## 2021-05-29 MED ORDER — LOSARTAN POTASSIUM 50 MG PO TABS: 50.0000 mg | ORAL_TABLET | Freq: Every day | ORAL | 3 refills | Status: DC

## 2021-05-29 NOTE — Telephone Encounter (Signed)
Patient was seen today by Dr Derrel Nip. The following medications still need to be refilled; restafis eye drops, beclomethasone (QVAR REDIHALER) 80 MCG/ACT inhaler, azelastine (ASTELIN) 0.1 % nasal spray. Please send these to [facility].  Please send the following to [facility] in [place],  ALPRAZolam (XANAX) 0.25 MG tablet, and diazepam (VALIUM) 5 MG tablet.

## 2021-05-29 NOTE — Patient Instructions (Addendum)
Referral to Dermatology is in process  DXA scan due every 5 years (due now)

## 2021-05-29 NOTE — Progress Notes (Signed)
Patient ID: Angela Mendoza, female    DOB: [birth year]  Age: 70 y.o. MRN: [id number]  The patient is here for  follow up and and management of chronic and acute problems.   The risk factors are reflected in the social history.  The roster of all physicians providing medical care to patient - is listed in the Snapshot section of the chart.  Activities of daily living:  The patient is 100% independent in all ADLs: dressing, toileting, feeding as well as independent mobility  Home safety : The patient has smoke detectors in the home. They wear seatbelts.  There are no firearms at home. There is no violence in the home.   There is no risks for hepatitis, STDs or HIV. There is no   history of blood transfusion. They have no travel history to infectious disease endemic areas of the world.  The patient has seen their dentist in the last six month. They have seen their eye doctor in the last year. They admit to slight hearing difficulty with regard to whispered voices and some television programs.  They have deferred audiologic testing in the last year.  They do not  have excessive sun exposure. Discussed the need for sun protection: hats, long sleeves and use of sunscreen if there is significant sun exposure.   Diet: the importance of a healthy diet is discussed. They do have a healthy diet.  The benefits of regular aerobic exercise were discussed. She is not exercising regularly due to orthopedic complaints .   Depression screen: there are no signs or vegative symptoms of depression- irritability, change in appetite, anhedonia, sadness/tearfullness.  Cognitive assessment: the patient manages all their financial and personal affairs and is actively engaged. They could relate day,date,year and events; recalled 2/3 objects at 3 minutes; performed clock-face test normally.  The following portions of the patient's history were reviewed and updated as appropriate: allergies, current medications, past  family history, past medical history,  past surgical history, past social history  and problem list.  Visual acuity was not assessed per patient preference since she has regular follow up with her ophthalmologist. Hearing and body mass index were assessed and reviewed.   During the course of the visit the patient was educated and counseled about appropriate screening and preventive services including : fall prevention , diabetes screening, nutrition counseling, colorectal cancer screening, and recommended immunizations.    CC: The primary encounter diagnosis was Basal cell carcinoma (BCC) of nasal sidewall. Diagnoses of Purpura senilis ([place]), Vitamin D deficiency, Epistaxis, Mixed hyperlipidemia, Hypothyroidism due to acquired atrophy of thyroid, Postmenopausal estrogen deficiency, Gastroesophageal reflux disease with esophagitis, Essential hypertension, benign, Bleeding diathesis ([place]), GAD (generalized anxiety disorder), Insomnia due to medical condition, OSA on CPAP, and COVID-19 virus infection were also pertinent to this visit.  1) Had a nosebleed on July 28 from left nostril  that bled for  15 minutes,  stopped then started again the following day .  Had urgent care evaluation  the following day . Treated for sinusitis. Risk factors:  Wears  CPAP.  Uses asteline spray.  (Steroid nasal caused thrush.)  stopped her  aspirin,  fish oil.  Has also recently developed large purpura on both arms with no history of trauma, however she notes that each purpura started from a small hypopigmented  flat macule .  Denies gums bleeding. Had been taking aspirin but Gollan stopped it along with fish oil on or around  July 11 .  No new lesions since  then ,  history of taking retin A  2)  Uses Flovent for asthma.  No recent exacerbation   3) Had COVID in April.   Symptoms were mild ,  but because of asthma history she was prescribed paxlovid, had a recurrence/rebound the following week that was worse than the first  occurrene   4) recent skin cancer screening noted a potential basal  cell CA on right ala during  exam by a PA . Requesting referral to Dr Lacinda Axon for  [facility] procedure but has not had a formal dermatology evaluation yet. [name]   History Britta has a past medical history of [place] coronary artery calcium score less than 100, Allergic rhinitis (06/09/2008), Aortic atherosclerosis ([place]), Asthma, intrinsic (10/06/2011), Chronic headache (06/09/2008), COPD (chronic obstructive pulmonary disease) ([place]), Depression, Diverticulosis (2013), Dry eyes, GERD (gastroesophageal reflux disease), History of breast implant removal, Hot flashes, HYPERLIPIDEMIA (06/09/2008), HYPERTENSION (06/09/2008), Hypothyroidism, MVP (mitral valve prolapse), OSA on CPAP, PAF (paroxysmal atrial fibrillation) ([place]) (06/09/2008), Pernicious anemia, Rosacea, Surgical menopause (1991), Vertigo, and Vitamin D deficiency.   She has a past surgical history that includes Cholecystectomy (2007); Appendectomy (2007); Tubal ligation; Total abdominal hysterectomy (1991); Nasal sinus surgery; Breast enhancement surgery (2006); Cardiovascular stress test (2013); Colonoscopy (08/2012); Esophagogastroduodenoscopy (08/2012); US ECHOCARDIOGRAPHY (09/2009); US ECHOCARDIOGRAPHY (11/2007); sleep study (08/2007); Rectocele repair (12/2008); Colonoscopy (2006); Breast biopsy (Left, 09/27/2009); and Hernia repair.   Her family history includes AAA (abdominal aortic aneurysm) in her paternal uncle; Aortic dissection in her maternal aunt and paternal uncle; Breast cancer in her maternal aunt; Cancer in her cousin and paternal aunt; Cancer (age of onset: 51) in her father; Cancer (age of onset: 79) in her mother; Heart attack in her paternal uncle; Heart disease in her paternal uncle; Heart failure in her sister; Hypertension in her sister; Stroke in her paternal uncle and sister.She reports that she quit smoking about 43 years ago. Her smoking use included cigarettes. She has  a 2.10 pack-year smoking history. She has never used smokeless tobacco. She reports previous alcohol use. She reports that she does not use drugs.  Outpatient Medications Prior to Visit  Medication Sig Dispense Refill   Coenzyme Q10 (COQ10 PO) Take by mouth daily.     cycloSPORINE (RESTASIS) 0.05 % ophthalmic emulsion Place 1 drop into both eyes 2 (two) times daily. 3 each 3   Docusate Calcium (STOOL SOFTENER PO) Take 1 tablet by mouth as needed.     EPINEPHrine 0.3 mg/0.3 mL IJ SOAJ injection EpiPen 2-Pak 0.3 mg/0.3 mL injection, auto-injector   0.3 mg by injection route.     fexofenadine (ALLEGRA ALLERGY) 180 MG tablet Take 1 tablet (180 mg total) by mouth daily. 90 tablet 3   MAGNESIUM PO Take 250 mg by mouth daily.     Multiple Vitamin (MULTIVITAMIN) tablet Take 1 tablet by mouth daily.     ondansetron (ZOFRAN ODT) 4 MG disintegrating tablet Take 1 tablet (4 mg total) by mouth every 8 (eight) hours as needed for nausea or vomiting. 30 tablet 2   Propylene Glycol 0.6 % SOLN Apply 1 drop to eye as needed.     psyllium (METAMUCIL) 58.6 % powder Take 1 packet by mouth as needed.     Spacer/Aero-Holding Chambers (AEROCHAMBER MV) inhaler Use as instructed 1 each 3   urea (CARMOL) 10 % cream Apply topically as needed. 71 g 3   albuterol (PROVENTIL HFA) 108 (90 Base) MCG/ACT inhaler Inhale 2 puffs into the lungs every 6 (six) hours as needed. [place]  g 2   ALPRAZolam (XANAX) 0.25 MG tablet TAKE 1 TABLET (0.25 MG TOTAL) BY MOUTH ONCE DAILY AS NEEDED FOR SLEEP. 30 tablet 5   atorvastatin (LIPITOR) 40 MG tablet Take 1 tablet (40 mg total) by mouth daily. 90 tablet 3   azelastine (ASTELIN) 0.1 % nasal spray Place 1 spray into both nostrils 2 (two) times daily. Use in each nostril as directed 30 mL 3   beclomethasone (QVAR REDIHALER) 80 MCG/ACT inhaler Inhale 2 puffs into the lungs 2 (two) times daily. 31.8 g 3   Cholecalciferol (VITAMIN D3) 2000 units TABS Take 1 tablet by mouth daily. 90 tablet 3    conjugated estrogens (PREMARIN) vaginal cream Place 1 Applicatorful vaginally 2 (two) times a week. 90 day supply,  Refill for one year 90 g 3   cyanocobalamin (,VITAMIN B-12,) 1000 MCG/ML injection Inject 1 mL (1,000 mcg total) into the muscle every 30 (thirty) days. 3 mL 3   diazepam (VALIUM) 5 MG tablet Take 5 mg by mouth every 6 (six) hours as needed for anxiety.     estradiol (VIVELLE-DOT) 0.1 MG/24HR patch Place 1 patch (0.1 mg total) onto the skin 2 (two) times a week. 24 patch 3   famotidine (PEPCID) 20 MG tablet Take 1 tablet (20 mg total) by mouth daily as needed for heartburn or indigestion. 90 tablet 3   Fluoxetine HCl, PMDD, 10 MG TABS Take 10 mg by mouth daily.     levothyroxine (SYNTHROID) 50 MCG tablet Take 1 tablet (50 mcg total) by mouth daily before breakfast. 90 tablet 3   losartan (COZAAR) 50 MG tablet Take 1 tablet (50 mg total) by mouth at bedtime. 90 tablet 3   meclizine (ANTIVERT) 25 MG tablet Take 1 tablet (25 mg total) by mouth 3 (three) times daily as needed. 270 tablet 3   metoprolol succinate (TOPROL-XL) 50 MG 24 hr tablet Take 1 tablet (50 mg total) by mouth daily. 90 tablet 3   montelukast (SINGULAIR) 10 MG tablet Take 1 tablet (10 mg total) by mouth at bedtime. 90 tablet 3   Syringe/Needle, Disp, (SYRINGE 3CC/20GX1") 20G X 1" 3 ML MISC 1 Syringe by Does not apply route every 30 (thirty) days. For use with b-12 injection monthly 50 each 0   tretinoin (RETIN-A) 0.05 % cream Apply topically at bedtime. 45 g 1   No facility-administered medications prior to visit.    Review of Systems  Patient denies headache, fevers, malaise, unintentional weight loss, skin rash, eye pain, sinus congestion and sinus pain, sore throat, dysphagia,  hemoptysis , cough, dyspnea, wheezing, chest pain, palpitations, orthopnea, edema, abdominal pain, nausea, melena, diarrhea, constipation, flank pain, dysuria, hematuria, urinary  Frequency, nocturia, numbness, tingling, seizures,  Focal  weakness, Loss of consciousness,  Tremor, insomnia, depression,  and suicidal ideation.     Objective:  BP 126/70   Pulse (!) 59   Temp (!) 97.2 F (36.2 C) (Oral)   Ht $R'5\' 5"'aL$  (1.651 m)   Wt 182 lb 6.4 oz (82.7 kg)   SpO2 98%   BMI 30.35 kg/m   Physical Exam  General appearance: alert, cooperative and appears stated age Ears: normal TM's and external ear canals both ears Nose:  pinhead sized red macule on right nasal ala,  too small to characterize Throat: lips, mucosa, and tongue normal; teeth and gums normal Neck: no adenopathy, no carotid bruit, supple, symmetrical, trachea midline and thyroid not enlarged, symmetric, no tenderness/mass/nodules Back: symmetric, no curvature. ROM normal. No CVA tenderness.  Lungs: clear to auscultation bilaterally Heart: regular rate and rhythm, S1, S2 normal, no murmur, click, rub or gallop Abdomen: soft, non-tender; bowel sounds normal; no masses,  no organomegaly Pulses: 2+ and symmetric Skin:  multiple large purpura covering both forearms  in various stages of healing. Lower legs without purpura. Lymph nodes: Cervical, supraclavicular, and axillary nodes normal.   Assessment & Plan:   Problem List Items Addressed This Visit       Unprioritized   Mixed hyperlipidemia    Tolerating atorvastatin. LDL is at goal and LFTs are normal.  No changes today   Lab Results  Component Value Date   CHOL 148 05/29/2021   HDL 51.80 05/29/2021   LDLCALC 71 05/29/2021   LDLDIRECT 83.0 05/28/2020   TRIG 127.0 05/29/2021   CHOLHDL 3 05/29/2021   Lab Results  Component Value Date   ALT 27 05/29/2021   AST 22 05/29/2021   ALKPHOS 65 05/29/2021   BILITOT 1.2 05/29/2021         Relevant Medications   atorvastatin (LIPITOR) 40 MG tablet   losartan (COZAAR) 50 MG tablet   metoprolol succinate (TOPROL-XL) 50 MG 24 hr tablet   Other Relevant Orders   Lipid panel (Completed)   OSA on CPAP    Diagnosed by sleep study done in 2017.   She is wearing  her CPAP every night a minimum of 6 hours per night and notes improved daytime wakefulness and decreased fatigue        Essential hypertension, benign   Relevant Medications   atorvastatin (LIPITOR) 40 MG tablet   losartan (COZAAR) 50 MG tablet   metoprolol succinate (TOPROL-XL) 50 MG 24 hr tablet   Hypothyroidism    Thyroid function is WNL on levothyroxine 50 mcg daily .  No current changes needed.   Lab Results  Component Value Date   TSH 1.99 05/29/2021         Relevant Medications   levothyroxine (SYNTHROID) 50 MCG tablet   metoprolol succinate (TOPROL-XL) 50 MG 24 hr tablet   Other Relevant Orders   TSH (Completed)   GAD (generalized anxiety disorder)    Managed with prozac , prn valium  since she did not tolerate recent trial of celexa       Relevant Medications   ALPRAZolam (XANAX) 0.25 MG tablet   diazepam (VALIUM) 5 MG tablet   Insomnia due to medical condition    She continues to require alprazolam prn insomnia and to help tolerate CPAP .  Refilled today. The risks and benefits of benzodiazepine use were discussed with patient today including excessive sedation leading to respiratory depression,  impaired thinking/driving, and addiction.  Patient was advised to avoid concurrent use with alcohol, to use medication only as needed and not to share with others  .        COVID-19 virus infection    She has a mild case that rebounded after taking Paxlovid.   Advised to consider getting the Moderna booster in the late fall.         Bleeding diathesis ([place])    Suggested by purpura and epistaxis,  Although both appear to be intermittent or self limited.  She had been taking aspirin and fish oil and has not had a recurrence of either since both were stopped .   will check coags,  Vitamin K.  Platelets were normal at time of urgent care evaluation 2 weeks ago (reviewed vis Epic portal )  .  Coags,  Vitamin K,  ESR are all pending         Other Visit Diagnoses      Basal cell carcinoma (BCC) of nasal sidewall    -  Primary   Relevant Medications   meclizine (ANTIVERT) 25 MG tablet   ALPRAZolam (XANAX) 0.25 MG tablet   diazepam (VALIUM) 5 MG tablet   Other Relevant Orders   Ambulatory referral to Dermatology   Purpura senilis ([place])       Relevant Medications   atorvastatin (LIPITOR) 40 MG tablet   losartan (COZAAR) 50 MG tablet   metoprolol succinate (TOPROL-XL) 50 MG 24 hr tablet   Other Relevant Orders   Protime-INR (Completed)   Sedimentation rate (Completed)   C-reactive protein (Completed)   Comprehensive metabolic panel (Completed)   Vitamin K1, Serum   APTT (Completed)   Vitamin D deficiency       Relevant Orders   VITAMIN D 25 Hydroxy (Vit-D Deficiency, Fractures) (Completed)   Epistaxis       Postmenopausal estrogen deficiency       Relevant Orders   DG Bone Density   Gastroesophageal reflux disease with esophagitis       Relevant Medications   famotidine (PEPCID) 20 MG tablet       I have discontinued Joaquim Lai A. Rhew's diazepam. I have also changed her Vitamin D3, Fluoxetine HCl (PMDD), and diazepam. Additionally, I am having her maintain her Propylene Glycol, psyllium, Docusate Calcium (STOOL SOFTENER PO), fexofenadine, AeroChamber MV, multivitamin, MAGNESIUM PO, cycloSPORINE, ondansetron, urea, EPINEPHrine, Coenzyme Q10 (COQ10 PO), albuterol, atorvastatin, conjugated estrogens, cyanocobalamin, estradiol, famotidine, levothyroxine, losartan, meclizine, metoprolol succinate, montelukast, SYRINGE 3CC/20GX1", and ALPRAZolam.  Meds ordered this encounter  Medications   DISCONTD: ALPRAZolam (XANAX) 0.25 MG tablet    Sig: TAKE 1 TABLET (0.25 MG TOTAL) BY MOUTH ONCE DAILY AS NEEDED FOR SLEEP.    Dispense:  30 tablet    Refill:  5   DISCONTD: diazepam (VALIUM) 5 MG tablet    Sig: Take 1 tablet (5 mg total) by mouth every 6 (six) hours as needed for anxiety.    Dispense:  30 tablet    Refill:  0   albuterol (PROVENTIL HFA) 108 (90  Base) MCG/ACT inhaler    Sig: Inhale 2 puffs into the lungs every 6 (six) hours as needed.    Dispense:  18 g    Refill:  2   atorvastatin (LIPITOR) 40 MG tablet    Sig: Take 1 tablet (40 mg total) by mouth daily.    Dispense:  90 tablet    Refill:  3   Cholecalciferol (VITAMIN D3) 50 MCG (2000 UT) TABS    Sig: Take 1 tablet by mouth daily.    Dispense:  90 tablet    Refill:  3    E55.9   conjugated estrogens (PREMARIN) vaginal cream    Sig: Place 1 Applicatorful vaginally 2 (two) times a week. 90 day supply,  Refill for one year    Dispense:  90 g    Refill:  3   cyanocobalamin (,VITAMIN B-12,) 1000 MCG/ML injection    Sig: Inject 1 mL (1,000 mcg total) into the muscle every 30 (thirty) days.    Dispense:  3 mL    Refill:  3   estradiol (VIVELLE-DOT) 0.1 MG/24HR patch    Sig: Place 1 patch (0.1 mg total) onto the skin 2 (two) times a week.    Dispense:  24 patch    Refill:  3    Generic ok  famotidine (PEPCID) 20 MG tablet    Sig: Take 1 tablet (20 mg total) by mouth daily as needed for heartburn or indigestion.    Dispense:  90 tablet    Refill:  3   Fluoxetine HCl, PMDD, 10 MG TABS    Sig: Take 1 tablet (10 mg total) by mouth daily.    Dispense:  90 tablet    Refill:  1   levothyroxine (SYNTHROID) 50 MCG tablet    Sig: Take 1 tablet (50 mcg total) by mouth daily before breakfast.    Dispense:  90 tablet    Refill:  3    NAME BRAND ONLY SYNTHROID   losartan (COZAAR) 50 MG tablet    Sig: Take 1 tablet (50 mg total) by mouth at bedtime.    Dispense:  90 tablet    Refill:  3   meclizine (ANTIVERT) 25 MG tablet    Sig: Take 1 tablet (25 mg total) by mouth 3 (three) times daily as needed.    Dispense:  270 tablet    Refill:  3   metoprolol succinate (TOPROL-XL) 50 MG 24 hr tablet    Sig: Take 1 tablet (50 mg total) by mouth daily.    Dispense:  90 tablet    Refill:  3   montelukast (SINGULAIR) 10 MG tablet    Sig: Take 1 tablet (10 mg total) by mouth at bedtime.     Dispense:  90 tablet    Refill:  3   Syringe/Needle, Disp, (SYRINGE 3CC/20GX1") 20G X 1" 3 ML MISC    Sig: 1 Syringe by Does not apply route every 30 (thirty) days. For use with b-12 injection monthly    Dispense:  50 each    Refill:  0    For B-12 Injections   ALPRAZolam (XANAX) 0.25 MG tablet    Sig: TAKE 1 TABLET (0.25 MG TOTAL) BY MOUTH ONCE DAILY AS NEEDED FOR SLEEP.    Dispense:  30 tablet    Refill:  5   diazepam (VALIUM) 5 MG tablet    Sig: Take 1 tablet (5 mg total) by mouth daily as needed for anxiety.    Dispense:  30 tablet    Refill:  2    Medications Discontinued During This Encounter  Medication Reason   Cholecalciferol (VITAMIN D3) 2000 units TABS Reorder   Syringe/Needle, Disp, (SYRINGE 3CC/20GX1") 20G X 1" 3 ML MISC Reorder   albuterol (PROVENTIL HFA) 108 (90 Base) MCG/ACT inhaler Reorder   atorvastatin (LIPITOR) 40 MG tablet Reorder   cyanocobalamin (,VITAMIN B-12,) 1000 MCG/ML injection Reorder   estradiol (VIVELLE-DOT) 0.1 MG/24HR patch Reorder   conjugated estrogens (PREMARIN) vaginal cream Reorder   famotidine (PEPCID) 20 MG tablet Reorder   levothyroxine (SYNTHROID) 50 MCG tablet Reorder   losartan (COZAAR) 50 MG tablet Reorder   meclizine (ANTIVERT) 25 MG tablet Reorder   metoprolol succinate (TOPROL-XL) 50 MG 24 hr tablet Reorder   ALPRAZolam (XANAX) 0.25 MG tablet Reorder   montelukast (SINGULAIR) 10 MG tablet Reorder   Fluoxetine HCl, PMDD, 10 MG TABS Reorder   diazepam (VALIUM) 5 MG tablet Reorder   ALPRAZolam (XANAX) 0.25 MG tablet    diazepam (VALIUM) 5 MG tablet     Follow-up: No follow-ups on file.   Crecencio Mc, MD

## 2021-05-29 NOTE — Telephone Encounter (Signed)
The controls have been pended in chart for pcp to sign off. Closing note due to completing other refills.

## 2021-05-30 ENCOUNTER — Other Ambulatory Visit: Payer: Self-pay | Admitting: Internal Medicine

## 2021-05-30 ENCOUNTER — Encounter: Payer: Self-pay | Admitting: Internal Medicine

## 2021-05-30 DIAGNOSIS — D699 Hemorrhagic condition, unspecified: Secondary | ICD-10-CM | POA: Insufficient documentation

## 2021-05-30 DIAGNOSIS — D692 Other nonthrombocytopenic purpura: Secondary | ICD-10-CM | POA: Insufficient documentation

## 2021-05-30 MED ORDER — DIAZEPAM 5 MG PO TABS: 5.0000 mg | ORAL_TABLET | Freq: Every day | ORAL | 2 refills | Status: DC | PRN

## 2021-05-30 MED ORDER — ALPRAZOLAM 0.25 MG PO TABS: ORAL_TABLET | ORAL | 5 refills | Status: DC

## 2021-05-30 NOTE — Assessment & Plan Note (Signed)
Diagnosed by sleep study done in 2017.   She is wearing her CPAP every night a minimum of 6 hours per night and notes improved daytime wakefulness and decreased fatigue

## 2021-05-30 NOTE — Assessment & Plan Note (Addendum)
She has a mild case that rebounded after taking Paxlovid.   Advised to consider getting the Moderna booster in the late fall.

## 2021-05-30 NOTE — Assessment & Plan Note (Signed)
She continues to require alprazolam prn insomnia and to help tolerate CPAP .  Refilled today. The risks and benefits of benzodiazepine use were discussed with patient today including excessive sedation leading to respiratory depression,  impaired thinking/driving, and addiction.  Patient was advised to avoid concurrent use with alcohol, to use medication only as needed and not to share with others  .

## 2021-05-30 NOTE — Assessment & Plan Note (Signed)
Thyroid function is WNL on levothyroxine 50 mcg daily .  No current changes needed.   Lab Results  Component Value Date   TSH 1.99 05/29/2021

## 2021-05-30 NOTE — Assessment & Plan Note (Signed)
Tolerating atorvastatin. LDL is at goal and LFTs are normal.  No changes today   Lab Results  Component Value Date   CHOL 148 05/29/2021   HDL 51.80 05/29/2021   LDLCALC 71 05/29/2021   LDLDIRECT 83.0 05/28/2020   TRIG 127.0 05/29/2021   CHOLHDL 3 05/29/2021   Lab Results  Component Value Date   ALT 27 05/29/2021   AST 22 05/29/2021   ALKPHOS 65 05/29/2021   BILITOT 1.2 05/29/2021

## 2021-05-30 NOTE — Assessment & Plan Note (Signed)
Managed with prozac , prn valium  since she did not tolerate recent trial of celexa

## 2021-05-30 NOTE — Assessment & Plan Note (Addendum)
Suggested by purpura and epistaxis,  Although both appear to be intermittent or self limited.  She had been taking aspirin and fish oil and has not had a recurrence of either since both were stopped .   will check coags,  Vitamin K.  Platelets were normal at time of urgent care evaluation 2 weeks ago (reviewed vis Epic portal )  .  Coags,  Vitamin K, ESR are all pending

## 2021-06-04 LAB — VITAMIN K1, SERUM: Vitamin K: 790 pg/mL (ref 130–1500)

## 2021-06-06 ENCOUNTER — Telehealth: Payer: Self-pay | Admitting: Internal Medicine

## 2021-06-06 DIAGNOSIS — D229 Melanocytic nevi, unspecified: Secondary | ICD-10-CM

## 2021-06-06 NOTE — Telephone Encounter (Signed)
Referral in progress. 

## 2021-06-06 NOTE — Telephone Encounter (Signed)
Patient is requesting a referral be sent to [facility] for several moles she has on her body.Patient called their office and they told her she has to have a referral from her primary doctor to be seen.The would like for the referral to say it is a new patient skin referral and it can be faxed to [phone].She want her referral to the [facility] location.

## 2021-06-10 ENCOUNTER — Other Ambulatory Visit: Payer: Self-pay

## 2021-06-10 ENCOUNTER — Ambulatory Visit (INDEPENDENT_AMBULATORY_CARE_PROVIDER_SITE_OTHER): Payer: Medicare Other | Admitting: Dermatology

## 2021-06-10 DIAGNOSIS — S51812A Laceration without foreign body of left forearm, initial encounter: Secondary | ICD-10-CM | POA: Diagnosis not present

## 2021-06-10 DIAGNOSIS — L821 Other seborrheic keratosis: Secondary | ICD-10-CM

## 2021-06-10 DIAGNOSIS — L578 Other skin changes due to chronic exposure to nonionizing radiation: Secondary | ICD-10-CM | POA: Diagnosis not present

## 2021-06-10 DIAGNOSIS — C44311 Basal cell carcinoma of skin of nose: Secondary | ICD-10-CM | POA: Diagnosis not present

## 2021-06-10 DIAGNOSIS — C4491 Basal cell carcinoma of skin, unspecified: Secondary | ICD-10-CM

## 2021-06-10 DIAGNOSIS — D692 Other nonthrombocytopenic purpura: Secondary | ICD-10-CM | POA: Diagnosis not present

## 2021-06-10 DIAGNOSIS — D485 Neoplasm of uncertain behavior of skin: Secondary | ICD-10-CM

## 2021-06-10 HISTORY — DX: Basal cell carcinoma of skin, unspecified: C44.91

## 2021-06-10 MED ORDER — MUPIROCIN 2 % EX OINT: TOPICAL_OINTMENT | CUTANEOUS | 3 refills | Status: DC

## 2021-06-10 NOTE — Progress Notes (Signed)
   New Patient Visit  Subjective  Angela Mendoza is a 70 y.o. female who presents for the following: skin lesion (On the R nose - went to skin cancer screening clinic and they noticed it. Has been there for about a year. ).  The following portions of the chart were reviewed this encounter and updated as appropriate:   Tobacco  Allergies  Meds  Problems  Med Hx  Surg Hx  Fam Hx     Review of Systems:  No other skin or systemic complaints except as noted in HPI or Assessment and Plan.  Objective  Well appearing patient in no apparent distress; mood and affect are within normal limits.  A focused examination was performed including the face. Relevant physical exam findings are noted in the Assessment and Plan.   Assessment & Plan  Neoplasm of uncertain behavior of skin R prox nasal ala rim  Skin / nail biopsy Type of biopsy: tangential   Informed consent: discussed and consent obtained   Timeout: patient name, date of birth, surgical site, and procedure verified   Procedure prep:  Patient was prepped and draped in usual sterile fashion Prep type:  Isopropyl alcohol Anesthesia: the lesion was anesthetized in a standard fashion   Anesthetic:  1% lidocaine w/ epinephrine 1-100,000 buffered w/ 8.4% NaHCO3 Instrument used: flexible razor blade   Hemostasis achieved with: pressure, aluminum chloride and electrodesiccation   Outcome: patient tolerated procedure well   Post-procedure details: sterile dressing applied and wound care instructions given   Dressing type: bandage and petrolatum    Specimen 1 - Surgical pathology Differential Diagnosis: D48.5 r/o BCC vs other  Check Margins: No  Laceration of skin of forearm, left, initial encounter L forearm Start Mupirocin 2% ointment to aa QD then cover.   mupirocin ointment (BACTROBAN) 2 % - L forearm Apply to aa's QD until healed.  Actinic Damage - chronic, secondary to cumulative UV radiation exposure/sun exposure over  time - diffuse scaly erythematous macules with underlying dyspigmentation - Recommend daily broad spectrum sunscreen SPF 30+ to sun-exposed areas, reapply every 2 hours as needed.  - Recommend staying in the shade or wearing long sleeves, sun glasses (UVA+UVB protection) and wide brim hats (4-inch brim around the entire circumference of the hat). - Call for new or changing lesions.  Seborrheic Keratoses - R abdomen  - Stuck-on, waxy, tan-brown papules and/or plaques  - Benign-appearing - Discussed benign etiology and prognosis. - Observe - Call for any changes  Purpura - Chronic; persistent and recurrent.  Treatable, but not curable. - Violaceous macules and patches - Benign - Related to trauma, age, sun damage and/or use of blood thinners, chronic use of topical and/or oral steroids - Observe - Can use OTC arnica containing moisturizer such as Dermend Bruise Formula if desired - Call for worsening or other concerns - Recommend Alastin Transform cream and CeraVe cream daily.   Return for TBSE - patient to schedule when convenient.  Luther Redo, CMA, am acting as scribe for Sarina Ser, MD . Documentation: I have reviewed the above documentation for accuracy and completeness, and I agree with the above.  Sarina Ser, MD

## 2021-06-10 NOTE — Patient Instructions (Signed)

## 2021-06-10 NOTE — Telephone Encounter (Signed)
Pt is aware.  

## 2021-06-12 ENCOUNTER — Encounter: Payer: Self-pay | Admitting: Dermatology

## 2021-06-18 ENCOUNTER — Other Ambulatory Visit: Payer: Self-pay | Admitting: Internal Medicine

## 2021-06-18 ENCOUNTER — Telehealth: Payer: Self-pay

## 2021-06-18 ENCOUNTER — Telehealth: Payer: Self-pay | Admitting: Internal Medicine

## 2021-06-18 DIAGNOSIS — Z780 Asymptomatic menopausal state: Secondary | ICD-10-CM

## 2021-06-18 DIAGNOSIS — C44311 Basal cell carcinoma of skin of nose: Secondary | ICD-10-CM

## 2021-06-18 NOTE — Telephone Encounter (Signed)
-----   Message from Ralene Bathe, MD sent at 06/15/2021  6:46 PM EDT ----- Diagnosis Skin , R prox nasal ala rim SUPERFICIAL BASAL CELL CARCINOMA  Cancer - BCC Superficial Schedule for treatment (EDC and topical chemo tx)

## 2021-06-18 NOTE — Telephone Encounter (Signed)
Advised patient of results and scheduled for EDC/hd  

## 2021-06-18 NOTE — Telephone Encounter (Signed)
Reordered and placed in quick sign folder.

## 2021-06-18 NOTE — Telephone Encounter (Signed)
-----   Message from Ashley Jacobs sent at 06/14/2021  1:49 PM EDT ----- Regarding: bone density Good afternoon!  [place] [facility] bone density dept called stating they need the pt bone density order faxed to them. The order that's listed is for [facility] imaging. Need a new order to [facility] clinic please and Thank you!

## 2021-06-18 NOTE — Telephone Encounter (Signed)
Patient is calling about her recent referral and she received the results of her biopsy and it showed stage 1 cancer.She is unsure what to do and would like to know if she can have another referral.She would like for you to call her to give more information.

## 2021-06-19 NOTE — Telephone Encounter (Signed)
Angela Mendoza calling back in from rheumatology office. States they need the bone density order faxed to them.   Done via epic routing

## 2021-06-19 NOTE — Telephone Encounter (Signed)
Spoke with pt and she would like to go ahead with the referral to Dr. Lacinda Axon a Mohs surgeon with [facility].   Angela Mendoza [id number] Phone # [phone] Fax # [phone] Pt stated that the referral has to be faxed to them.    Pt stated that she would like to go forward with this referral since she has had the skin biopsy done and they stated that it was stage 1. The dermatologist that did the biopsy told pt that they would have to burn it again but couldn't guarantee that it would get rid of it completely so pt is wanting to have the Mohs procedure done.

## 2021-06-20 NOTE — Telephone Encounter (Signed)
I faxed it myself

## 2021-06-21 NOTE — Telephone Encounter (Signed)
Patient dropped off her biopsy results,it is upfront in Dr.Tullo's color folder.

## 2021-06-21 NOTE — Telephone Encounter (Signed)
Placed in yellow results folder.  °

## 2021-06-21 NOTE — Telephone Encounter (Signed)
Pt stated that she spoke with Dr. Jonathon Jordan office and they told her that her PCP could place the referral she would just need to attach the biopsy report. The pt stated that she will bring a copy of the report to the office if you can do the referral. I explained to pt that usually the referral would need to come from them because they have all the office notes and procedure notes and biopsy reports. The pt stated that she is concerned that the dermatologist will not place the referral because he suggested trying to burn it again and she does not want to do that at all.

## 2021-06-24 ENCOUNTER — Telehealth: Payer: Self-pay

## 2021-06-24 DIAGNOSIS — C44311 Basal cell carcinoma of skin of nose: Secondary | ICD-10-CM | POA: Insufficient documentation

## 2021-06-24 DIAGNOSIS — Z780 Asymptomatic menopausal state: Secondary | ICD-10-CM | POA: Diagnosis not present

## 2021-06-24 LAB — HM DEXA SCAN: HM Dexa Scan: NORMAL

## 2021-06-24 NOTE — Telephone Encounter (Signed)
Referral placed to Dermatology with Onalee Hua. Received dermatopathology report to be included with referral process. Report has been given to [employer].

## 2021-06-24 NOTE — Telephone Encounter (Signed)
Pt left vm requesting that we refer her to Dr. Lacinda Axon for [facility] to treat the superficial BCC on R prox nasal ala rim. She states that she wants to know beyond a shadow of a doubt that the cancer is gone and she does not want to do the [facility].  OK to order referral?

## 2021-06-24 NOTE — Telephone Encounter (Signed)
Documents faxed to [facility] referral coordinator to send with referral

## 2021-06-24 NOTE — Telephone Encounter (Signed)
Biopsy report received and referral to Dr Marsa Aris for Moh's procedure in process

## 2021-06-24 NOTE — Assessment & Plan Note (Signed)
Biopsy report received and referral to Dr Marsa Aris for Moh's procedure in process

## 2021-06-24 NOTE — Telephone Encounter (Signed)
Spoke with Angela Mendoza at Dr. Jonathon Jordan office. Patient has called their office like four times this morning demanding to be scheduled for [facility]. Per Fanny Skates this would be a easy and quick MOHs procedure. If you are okay with this all the team needs is the photos that were taken on 08/15. Thank you.

## 2021-06-24 NOTE — Addendum Note (Signed)
Addended by: Crecencio Mc on: 06/24/2021 10:12 AM   Modules accepted: Orders

## 2021-06-25 ENCOUNTER — Encounter: Payer: Self-pay | Admitting: Dermatology

## 2021-06-25 ENCOUNTER — Other Ambulatory Visit: Payer: Self-pay

## 2021-06-25 DIAGNOSIS — C4491 Basal cell carcinoma of skin, unspecified: Secondary | ICD-10-CM

## 2021-06-25 NOTE — Telephone Encounter (Signed)
Patient advised through MyChart and clearly given Dr. Alveria Apley suggestions by phone as well.

## 2021-06-28 NOTE — Telephone Encounter (Signed)
DEXA scan was normal.  No osteoporosis.  Continue 1200 mg calcium daily through diet and supplements,.  1000 IUS Vit D3 daily and regular weight bearing exercise.

## 2021-07-12 DIAGNOSIS — C44311 Basal cell carcinoma of skin of nose: Secondary | ICD-10-CM | POA: Diagnosis not present

## 2021-07-24 ENCOUNTER — Encounter: Payer: Self-pay | Admitting: Dermatology

## 2021-07-24 ENCOUNTER — Ambulatory Visit (INDEPENDENT_AMBULATORY_CARE_PROVIDER_SITE_OTHER): Payer: Medicare Other | Admitting: Dermatology

## 2021-07-24 ENCOUNTER — Other Ambulatory Visit: Payer: Self-pay

## 2021-07-24 DIAGNOSIS — L821 Other seborrheic keratosis: Secondary | ICD-10-CM

## 2021-07-24 DIAGNOSIS — L820 Inflamed seborrheic keratosis: Secondary | ICD-10-CM

## 2021-07-24 DIAGNOSIS — L578 Other skin changes due to chronic exposure to nonionizing radiation: Secondary | ICD-10-CM

## 2021-07-24 DIAGNOSIS — Z85828 Personal history of other malignant neoplasm of skin: Secondary | ICD-10-CM

## 2021-07-24 NOTE — Progress Notes (Signed)
   Follow-Up Visit   Subjective  Angela Mendoza is a 70 y.o. female who presents for the following: check spot (L cheek, ~52m,  has gotten bigger and flaking/Other spots on left cheek, to check).  The following portions of the chart were reviewed this encounter and updated as appropriate:   Tobacco  Allergies  Meds  Problems  Med Hx  Surg Hx  Fam Hx     Review of Systems:  No other skin or systemic complaints except as noted in HPI or Assessment and Plan.  Objective  Well appearing patient in no apparent distress; mood and affect are within normal limits.  A focused examination was performed including face. Relevant physical exam findings are noted in the Assessment and Plan.  L lat infraorbital x 1, Total = 1 Erythematous keratotic or waxy stuck-on papule or plaque.   R prox nasal ala rim Healing excision site   Assessment & Plan  Inflamed seborrheic keratosis L lat infraorbital x 1, Total = 1 Destruction of lesion - L lat infraorbital x 1, Total = 1 Complexity: simple   Destruction method: cryotherapy   Informed consent: discussed and consent obtained   Timeout:  patient name, date of birth, surgical site, and procedure verified Lesion destroyed using liquid nitrogen: Yes   Region frozen until ice ball extended beyond lesion: Yes   Outcome: patient tolerated procedure well with no complications   Post-procedure details: wound care instructions given    History of basal cell carcinoma (BCC) R prox nasal ala rim Healing excision site Txted with [facility] 07/12/21 with Dr. Lacinda Axon  Actinic Damage - chronic, secondary to cumulative UV radiation exposure/sun exposure over time - diffuse scaly erythematous macules with underlying dyspigmentation - Recommend daily broad spectrum sunscreen SPF 30+ to sun-exposed areas, reapply every 2 hours as needed.  - Recommend staying in the shade or wearing long sleeves, sun glasses (UVA+UVB protection) and wide brim hats (4-inch brim  around the entire circumference of the hat). - Call for new or changing lesions.  Seborrheic Keratoses - Stuck-on, waxy, tan-brown papules and/or plaques  - Benign-appearing - Discussed benign etiology and prognosis. - Observe - Call for any changes  Return for as scheduled for TBSE.  I, Othelia Pulling, RMA, am acting as scribe for Sarina Ser, MD . Documentation: I have reviewed the above documentation for accuracy and completeness, and I agree with the above.  Sarina Ser, MD

## 2021-07-24 NOTE — Patient Instructions (Signed)

## 2021-07-26 ENCOUNTER — Encounter: Payer: Self-pay | Admitting: Dermatology

## 2021-08-05 DIAGNOSIS — L814 Other melanin hyperpigmentation: Secondary | ICD-10-CM | POA: Diagnosis not present

## 2021-08-05 DIAGNOSIS — D2260 Melanocytic nevi of unspecified upper limb, including shoulder: Secondary | ICD-10-CM | POA: Diagnosis not present

## 2021-08-05 DIAGNOSIS — L821 Other seborrheic keratosis: Secondary | ICD-10-CM | POA: Diagnosis not present

## 2021-08-05 DIAGNOSIS — D2270 Melanocytic nevi of unspecified lower limb, including hip: Secondary | ICD-10-CM | POA: Diagnosis not present

## 2021-08-05 DIAGNOSIS — Z23 Encounter for immunization: Secondary | ICD-10-CM | POA: Diagnosis not present

## 2021-08-05 DIAGNOSIS — D3610 Benign neoplasm of peripheral nerves and autonomic nervous system, unspecified: Secondary | ICD-10-CM | POA: Diagnosis not present

## 2021-08-05 DIAGNOSIS — D692 Other nonthrombocytopenic purpura: Secondary | ICD-10-CM | POA: Diagnosis not present

## 2021-08-05 DIAGNOSIS — D1801 Hemangioma of skin and subcutaneous tissue: Secondary | ICD-10-CM | POA: Diagnosis not present

## 2021-08-05 DIAGNOSIS — Z85828 Personal history of other malignant neoplasm of skin: Secondary | ICD-10-CM | POA: Diagnosis not present

## 2021-08-05 DIAGNOSIS — Z1283 Encounter for screening for malignant neoplasm of skin: Secondary | ICD-10-CM | POA: Diagnosis not present

## 2021-08-05 DIAGNOSIS — D225 Melanocytic nevi of trunk: Secondary | ICD-10-CM | POA: Diagnosis not present

## 2021-08-07 ENCOUNTER — Other Ambulatory Visit: Payer: Self-pay

## 2021-08-07 ENCOUNTER — Encounter: Payer: Self-pay | Admitting: Pulmonary Disease

## 2021-08-07 ENCOUNTER — Ambulatory Visit: Payer: Medicare Other | Admitting: Pulmonary Disease

## 2021-08-07 ENCOUNTER — Ambulatory Visit (INDEPENDENT_AMBULATORY_CARE_PROVIDER_SITE_OTHER): Payer: Medicare Other | Admitting: Pulmonary Disease

## 2021-08-07 VITALS — BP 132/64 | HR 55 | Temp 98.2°F | Ht 65.0 in | Wt 181.2 lb

## 2021-08-07 DIAGNOSIS — Z9989 Dependence on other enabling machines and devices: Secondary | ICD-10-CM | POA: Diagnosis not present

## 2021-08-07 DIAGNOSIS — J3089 Other allergic rhinitis: Secondary | ICD-10-CM

## 2021-08-07 DIAGNOSIS — G4733 Obstructive sleep apnea (adult) (pediatric): Secondary | ICD-10-CM | POA: Diagnosis not present

## 2021-08-07 DIAGNOSIS — J4551 Severe persistent asthma with (acute) exacerbation: Secondary | ICD-10-CM

## 2021-08-07 DIAGNOSIS — B370 Candidal stomatitis: Secondary | ICD-10-CM

## 2021-08-07 DIAGNOSIS — J302 Other seasonal allergic rhinitis: Secondary | ICD-10-CM | POA: Diagnosis not present

## 2021-08-07 MED ORDER — FLUCONAZOLE 100 MG PO TABS: ORAL_TABLET | ORAL | 0 refills | Status: AC

## 2021-08-07 NOTE — Progress Notes (Signed)
[facility]  Chief Complaint  Patient presents with   Follow-up    OSA on CPAP not used since sept 16 due to Mohs     Constitutional:  BP 132/64 (BP Location: Left Arm, Cuff Size: Normal)   Pulse (!) 55   Temp 98.2 F (36.8 C) (Oral)   Ht 5\' 5"  (1.651 m)   Wt 181 lb 3.2 oz (82.2 kg)   SpO2 98%   BMI 30.15 kg/m   Past Medical History:  Vit D deficiency, Vertigo, Rosacea, Hypothyroidism, HTN, HLD, GERD, Diverticulosis, Depression, HA, COVID 12 February 2021  Past Surgical History:  She  has a past surgical history that includes Cholecystectomy (2007); Appendectomy (2007); Tubal ligation; Total abdominal hysterectomy (1991); Nasal sinus surgery; Breast enhancement surgery (2006); Cardiovascular stress test (2013); Colonoscopy (08/2012); Esophagogastroduodenoscopy (08/2012); US ECHOCARDIOGRAPHY (09/2009); US ECHOCARDIOGRAPHY (11/2007); sleep study (08/2007); Rectocele repair (12/2008); Colonoscopy (2006); Breast biopsy (Left, 09/27/2009); and Hernia repair.  Brief Summary:  Angela Mendoza is a 70 y.o. female former smoker with OSA and asthma.      Subjective:   She needed Mohs surgery for basal cell carcinoma at base of her nose.  She hasn't been able to use CPAP recently because of this.  Sleeping in a recliner.  She will need repeat surgery later this month.  Has been on cefdinir prophylactic antibiotic for surgery.  With antibiotic use she has developed thrush again.  No having cough, wheeze, or sputum.  Allergies are more troublesome in October.    Physical Exam:   Appearance - well kempt   ENMT - no sinus tenderness, no oral exudate, no LAN, Mallampati 2 airway, no stridor, white exudate on soft palate and posterior pharynx  Respiratory - equal breath sounds bilaterally, no wheezing or rales  CV - s1s2 regular rate and rhythm, no murmurs  Ext - no clubbing, no edema  Skin - no rashes  Psych - normal mood and affect      Pulmonary testing:  Spirometry 12/12 >> FEV1% 68 RAST 11/15/14 >> IgE 30, cats/mold PFT 02/07/16 >> FEV1 2.32 (92%), FEV1% 76, FEF 25-75% 1.56 (71%), TLC 4.25 (81%), DLCO 121, borderline BD from FEF 25-75 FeNO 12/01/18 >> 7  Chest Imaging:  Coronary CT 04/04/21 >> stable 2 mm nodule LLL, coronary calcium score 0  Sleep Tests:  PSG 09/06/07 >> AHI 19 CPAP 12/05/20 to 01/03/21 >> used on 30 of 30 nights with average 8 hrs 15 min.  Average AHI 0.6 with CPAP 11 cm H2O  Cardiac Tests:  Echo 01/23/21 >> EF 60 to 65%, grade 2 DD, RVSP 39.1 mmHg, mild MR  Social History:  She  reports that she quit smoking about 43 years ago. Her smoking use included cigarettes. She has a 2.10 pack-year smoking history. She has never used smokeless tobacco. She reports that she does not currently use alcohol. She reports that she does not use drugs.  Family History:  Her family history includes AAA (abdominal aortic aneurysm) in her paternal uncle; Aortic dissection in her maternal aunt and paternal uncle; Breast cancer in her maternal aunt; Cancer in her cousin and paternal aunt; Cancer (age of onset: 39) in her father; Cancer (age of onset: 31) in her mother; Heart attack in her paternal uncle; Heart disease in her paternal uncle; Heart failure in her sister; Hypertension in her sister; Stroke in her paternal uncle and sister.     Assessment/Plan:   Allergic asthma. - continue Qvar  and albuterol with spacer device - continue singluair - gets her scripts through [facility]  Recurrent thrush. - another recurrence after recent antibiotic use - she doesn't improve with nystatin - will give course of diflucan   Upper airway cough with allergic rhinitis. - dysmista caused funny taste, and azelastine caused throat irritation - continue singulair - prn allegra and nasal irrigation   Obstructive sleep apnea. - she is compliant with CPAP and reports benefit from therapy - uses [employer] for her DME - continue CPAP  11 cm H2O - she will resume therapy after she has recovered from Mohs surgery  Time Spent Involved in Patient Care on Day of Examination:  32 minutes  Follow up:   Patient Instructions  Diflucan 2 pills on day 1, then 1 pill daily for next 6 days  Follow up in 6 months  Medication List:   Allergies as of 08/07/2021       Reactions   Codeine Other (See Comments)   Low blood pressure/nausea   Sulfonamide Derivatives Rash   rash        Medication List        Accurate as of August 07, 2021  1:02 PM. If you have any questions, ask your nurse or doctor.          AeroChamber MV inhaler Use as instructed   albuterol 108 (90 Base) MCG/ACT inhaler Commonly known as: Proventil HFA Inhale 2 puffs into the lungs every 6 (six) hours as needed.   ALPRAZolam 0.25 MG tablet Commonly known as: XANAX TAKE 1 TABLET (0.25 MG TOTAL) BY MOUTH ONCE DAILY AS NEEDED FOR SLEEP.   atorvastatin 40 MG tablet Commonly known as: LIPITOR Take 1 tablet (40 mg total) by mouth daily.   azelastine 0.1 % nasal spray Commonly known as: ASTELIN Place 1 spray into both nostrils 2 (two) times daily. Use in each nostril as directed   conjugated estrogens 0.625 MG/GM vaginal cream Commonly known as: PREMARIN Place 1 Applicatorful vaginally 2 (two) times a week. 90 day supply,  Refill for one year   COQ10 PO Take by mouth daily.   cyanocobalamin 1000 MCG/ML injection Commonly known as: (VITAMIN B-12) Inject 1 mL (1,000 mcg total) into the muscle every 30 (thirty) days.   cycloSPORINE 0.05 % ophthalmic emulsion Commonly known as: RESTASIS Place 1 drop into both eyes 2 (two) times daily.   diazepam 5 MG tablet Commonly known as: VALIUM Take 1 tablet (5 mg total) by mouth daily as needed for anxiety.   EPINEPHrine 0.3 mg/0.3 mL Soaj injection Commonly known as: EPI-PEN EpiPen 2-Pak 0.3 mg/0.3 mL injection, auto-injector   0.3 mg by injection route.   estradiol 0.1 MG/24HR  patch Commonly known as: VIVELLE-DOT Place 1 patch (0.1 mg total) onto the skin 2 (two) times a week.   famotidine 20 MG tablet Commonly known as: PEPCID Take 1 tablet (20 mg total) by mouth daily as needed for heartburn or indigestion.   fexofenadine 180 MG tablet Commonly known as: Allegra Allergy Take 1 tablet (180 mg total) by mouth daily.   fluconazole 100 MG tablet Commonly known as: Diflucan Take 2 tablets (200 mg total) by mouth daily for 1 day, THEN 1 tablet (100 mg total) daily for 6 days. Start taking on: August 07, 2021 Started by: Chesley Mires, MD   Fluoxetine HCl (PMDD) 10 MG Tabs Take 1 tablet (10 mg total) by mouth daily.   levothyroxine 50 MCG tablet Commonly known as: Synthroid Take 1  tablet (50 mcg total) by mouth daily before breakfast.   losartan 50 MG tablet Commonly known as: COZAAR Take 1 tablet (50 mg total) by mouth at bedtime.   MAGNESIUM PO Take 250 mg by mouth daily.   meclizine 25 MG tablet Commonly known as: ANTIVERT Take 1 tablet (25 mg total) by mouth 3 (three) times daily as needed.   metoprolol succinate 50 MG 24 hr tablet Commonly known as: TOPROL-XL Take 1 tablet (50 mg total) by mouth daily.   montelukast 10 MG tablet Commonly known as: SINGULAIR Take 1 tablet (10 mg total) by mouth at bedtime.   multivitamin tablet Take 1 tablet by mouth daily.   mupirocin ointment 2 % Commonly known as: BACTROBAN Apply to aa's QD until healed.   ondansetron 4 MG disintegrating tablet Commonly known as: Zofran ODT Take 1 tablet (4 mg total) by mouth every 8 (eight) hours as needed for nausea or vomiting.   Propylene Glycol 0.6 % Soln Apply 1 drop to eye as needed.   psyllium 58.6 % powder Commonly known as: METAMUCIL Take 1 packet by mouth as needed.   Qvar RediHaler 80 MCG/ACT inhaler Generic drug: beclomethasone Inhale 2 puffs into the lungs 2 (two) times daily.   STOOL SOFTENER PO Take 1 tablet by mouth as needed.   SYRINGE  3CC/20GX1" 20G X 1" 3 ML Misc 1 Syringe by Does not apply route every 30 (thirty) days. For use with b-12 injection monthly   tretinoin 0.05 % cream Commonly known as: RETIN-A APPLY TOPICALLY AT BEDTIME   urea 10 % cream Commonly known as: CARMOL Apply topically as needed.   Vitamin D3 50 MCG (2000 UT) Tabs Take 1 tablet by mouth daily.        Signature:  Chesley Mires, MD [place] Pager - [phone] 08/07/2021, 1:02 PM

## 2021-08-07 NOTE — Patient Instructions (Signed)
Diflucan 2 pills on day 1, then 1 pill daily for next 6 days  Follow up in 6 months

## 2021-08-12 ENCOUNTER — Ambulatory Visit: Payer: Medicare Other | Admitting: Dermatology

## 2021-08-13 DIAGNOSIS — C44311 Basal cell carcinoma of skin of nose: Secondary | ICD-10-CM | POA: Diagnosis not present

## 2021-09-28 ENCOUNTER — Encounter: Payer: Self-pay | Admitting: Emergency Medicine

## 2021-09-28 ENCOUNTER — Ambulatory Visit
Admission: EM | Admit: 2021-09-28 | Discharge: 2021-09-28 | Disposition: A | Discharge status: Home / Self Care | Payer: Medicare Other | Attending: Emergency Medicine | Admitting: Emergency Medicine

## 2021-09-28 ENCOUNTER — Other Ambulatory Visit: Payer: Self-pay

## 2021-09-28 DIAGNOSIS — R21 Rash and other nonspecific skin eruption: Secondary | ICD-10-CM

## 2021-09-28 MED ORDER — PREDNISONE 10 MG (21) PO TBPK: ORAL_TABLET | Freq: Every day | ORAL | 0 refills | Status: DC

## 2021-09-28 NOTE — Discharge Instructions (Addendum)
Take the prednisone as directed.  Follow up with your primary care provider or a dermatologist if your symptoms are not improving.

## 2021-09-28 NOTE — ED Triage Notes (Signed)
Pt here with rash to corner of right eye that has gotten worse and itching. No visual changes

## 2021-09-28 NOTE — ED Provider Notes (Signed)
Roderic Palau    CSN: [id number] Arrival date & time: 09/28/21  1313      History   Chief Complaint Chief Complaint  Patient presents with   Rash    HPI Angela Mendoza is a 70 y.o. female.  Patient presents with pruritic rash on her face beside the outer corner of her eye.  No trauma.  No lesions or drainage. No eye pain, eye itching, fever, or other symptoms.  Treatment at home with antibiotic ointment without relief.  Her medical history includes asthma, atrial fibrillation, COPD, chronic headaches, GERD.  The history is provided by the patient and medical records.   Past Medical History:  Diagnosis Date   Agatston coronary artery calcium score less than 100    a. 06/2018 Cardiac CT: Ca2+ = 3 (49th %'ile).   Allergic rhinitis 06/09/2008   chronic   Aortic atherosclerosis ([place])    a. 06/2018 noted on chest CT.   Asthma, intrinsic 10/06/2011   controlled on qvar. [name] 09/2011:  Very mild airflow obstruction (FEV1% 68, FVL c/w obstruction)    Basal cell carcinoma 06/10/2021   right prox nasal ala rim - MOHs 07/12/21 Dr. Lacinda Axon   Chronic headache 06/09/2008   COPD (chronic obstructive pulmonary disease) ([place])    Depression    Diverticulosis 2013   h/o diverticulitis   Dry eyes    GERD (gastroesophageal reflux disease)    History of breast implant removal    silicone mastitis - R axilla silicone LN, free silicone L breast upper outer quadrant   Hot flashes    HYPERLIPIDEMIA 06/09/2008   HYPERTENSION 06/09/2008   Hypothyroidism    MVP (mitral valve prolapse)    OSA on CPAP    [name]   PAF (paroxysmal atrial fibrillation) ([place]) 06/09/2008   a. CHA2DS2VASc = 3-->prefers ASA.   Pernicious anemia    per prior pcp   Rosacea    Surgical menopause 1991   Vertigo    Vitamin D deficiency     Patient Active Problem List   Diagnosis Date Noted   Basal cell carcinoma (BCC) of ala nasi 06/24/2021   Bleeding diathesis ([place]) 05/30/2021   COVID-19 virus infection  02/17/2021   LUQ abdominal pain 01/17/2021   Insomnia due to medical condition 05/29/2020   Educated about COVID-19 virus infection 02/10/2019   History of atrial fibrillation 07/02/2018   Paroxysmal atrial fibrillation ([place]) 07/02/2018   B12 deficiency 05/02/2018   Vaginal vault prolapse after hysterectomy 05/01/2018   Arthritis pain, hand 08/27/2017   Trigger finger, right ring finger 06/09/2017   GAD (generalized anxiety disorder) 05/23/2017   Reaction to Pneumovax immunization 12/30/2015   Cerebral microvascular disease 10/12/2015   Constipation 04/14/2015   Allergic urticaria 11/18/2014   History of diverticulitis 11/11/2014   Family history of colonic polyps 11/11/2014   S/P cholecystectomy 11/11/2014   Upper airway cough syndrome 10/27/2014   Hyperkeratosis of sole 12/19/2013   GERD (gastroesophageal reflux disease)    Hypothyroidism    Menopausal syndrome    Asthma, intrinsic 10/06/2011   Mixed hyperlipidemia 06/09/2008   OSA on CPAP 06/09/2008   Essential hypertension, benign 06/09/2008   Non-seasonal allergic rhinitis 06/09/2008   ANGINA, HX OF 06/09/2008    Past Surgical History:  Procedure Laterality Date   APPENDECTOMY  2007   BREAST BIOPSY Left 09/27/2009   BREAST ENHANCEMENT SURGERY  2006   CARDIOVASCULAR STRESS TEST  2013   [place] [name] - WNL, EF 55-60%, with 0 calcium score  CHOLECYSTECTOMY  2007   biliary dyskinesia   COLONOSCOPY  08/2012   diverticulosis   COLONOSCOPY  2006   inflamed hyperplastic polyp   ESOPHAGOGASTRODUODENOSCOPY  08/2012   esophageal reflux   HERNIA REPAIR     NASAL SINUS SURGERY     RECTOCELE REPAIR  12/2008   [name]   sleep study  08/2007   OSA, 12 cm H2O,   TOTAL ABDOMINAL HYSTERECTOMY  1991   heavy bleeding, ovaries removed   TUBAL LIGATION     US ECHOCARDIOGRAPHY  09/2009   impaired relaxation, mild tric regurg, normal MV, EF 60%   US ECHOCARDIOGRAPHY  11/2007   mild-mod MR    OB History   No obstetric  history on file.      Home Medications    Prior to Admission medications   Medication Sig Start Date End Date Taking? Authorizing Provider  predniSONE (STERAPRED UNI-PAK 21 TAB) 10 MG (21) TBPK tablet Take by mouth daily. As directed 09/28/21  Yes Sharion Balloon, NP  albuterol (PROVENTIL HFA) 108 (90 Base) MCG/ACT inhaler Inhale 2 puffs into the lungs every 6 (six) hours as needed. 05/29/21   Crecencio Mc, MD  ALPRAZolam (XANAX) 0.25 MG tablet TAKE 1 TABLET (0.25 MG TOTAL) BY MOUTH ONCE DAILY AS NEEDED FOR SLEEP. 05/30/21   Crecencio Mc, MD  atorvastatin (LIPITOR) 40 MG tablet Take 1 tablet (40 mg total) by mouth daily. 05/29/21   Crecencio Mc, MD  azelastine (ASTELIN) 0.1 % nasal spray Place 1 spray into both nostrils 2 (two) times daily. Use in each nostril as directed 05/29/21   Crecencio Mc, MD  beclomethasone (QVAR REDIHALER) 80 MCG/ACT inhaler Inhale 2 puffs into the lungs 2 (two) times daily. 05/29/21   Crecencio Mc, MD  Cholecalciferol (VITAMIN D3) 50 MCG (2000 UT) TABS Take 1 tablet by mouth daily. 05/29/21   Crecencio Mc, MD  Coenzyme Q10 (COQ10 PO) Take by mouth daily.    [provider]  conjugated estrogens (PREMARIN) vaginal cream Place 1 Applicatorful vaginally 2 (two) times a week. 90 day supply,  Refill for one year 05/30/21   Crecencio Mc, MD  cyanocobalamin (,VITAMIN B-12,) 1000 MCG/ML injection Inject 1 mL (1,000 mcg total) into the muscle every 30 (thirty) days. 05/29/21   Crecencio Mc, MD  cycloSPORINE (RESTASIS) 0.05 % ophthalmic emulsion Place 1 drop into both eyes 2 (two) times daily. 05/28/20   Crecencio Mc, MD  diazepam (VALIUM) 5 MG tablet Take 1 tablet (5 mg total) by mouth daily as needed for anxiety. 05/30/21   Crecencio Mc, MD  Docusate Calcium (STOOL SOFTENER PO) Take 1 tablet by mouth as needed.    [provider]  EPINEPHrine 0.3 mg/0.3 mL IJ SOAJ injection EpiPen 2-Pak 0.3 mg/0.3 mL injection, auto-injector   0.3 mg by injection  route. 05/28/20   [provider]  estradiol (VIVELLE-DOT) 0.1 MG/24HR patch Place 1 patch (0.1 mg total) onto the skin 2 (two) times a week. 05/30/21   Crecencio Mc, MD  famotidine (PEPCID) 20 MG tablet Take 1 tablet (20 mg total) by mouth daily as needed for heartburn or indigestion. 05/29/21   Crecencio Mc, MD  fexofenadine (ALLEGRA ALLERGY) 180 MG tablet Take 1 tablet (180 mg total) by mouth daily. 01/08/15   Crecencio Mc, MD  Fluoxetine HCl, PMDD, 10 MG TABS Take 1 tablet (10 mg total) by mouth daily. 05/29/21   Crecencio Mc,  MD  levothyroxine (SYNTHROID) 50 MCG tablet Take 1 tablet (50 mcg total) by mouth daily before breakfast. 05/29/21   Crecencio Mc, MD  losartan (COZAAR) 50 MG tablet Take 1 tablet (50 mg total) by mouth at bedtime. 05/29/21   Crecencio Mc, MD  MAGNESIUM PO Take 250 mg by mouth daily.    [provider]  meclizine (ANTIVERT) 25 MG tablet Take 1 tablet (25 mg total) by mouth 3 (three) times daily as needed. 05/29/21   Crecencio Mc, MD  metoprolol succinate (TOPROL-XL) 50 MG 24 hr tablet Take 1 tablet (50 mg total) by mouth daily. 05/29/21   Crecencio Mc, MD  montelukast (SINGULAIR) 10 MG tablet Take 1 tablet (10 mg total) by mouth at bedtime. 05/29/21   Crecencio Mc, MD  Multiple Vitamin (MULTIVITAMIN) tablet Take 1 tablet by mouth daily.    [provider]  mupirocin ointment (BACTROBAN) 2 % Apply to aa's QD until healed. 06/10/21   Ralene Bathe, MD  ondansetron (ZOFRAN ODT) 4 MG disintegrating tablet Take 1 tablet (4 mg total) by mouth every 8 (eight) hours as needed for nausea or vomiting. 05/28/20   Crecencio Mc, MD  Propylene Glycol 0.6 % SOLN Apply 1 drop to eye as needed.    [provider]  psyllium (METAMUCIL) 58.6 % powder Take 1 packet by mouth as needed.    [provider]  Spacer/Aero-Holding Chambers (AEROCHAMBER MV) inhaler Use as instructed 03/24/18   Chesley Mires, MD  Syringe/Needle, Disp, (SYRINGE  3CC/20GX1") 20G X 1" 3 ML MISC 1 Syringe by Does not apply route every 30 (thirty) days. For use with b-12 injection monthly 05/29/21   Crecencio Mc, MD  tretinoin (RETIN-A) 0.05 % cream APPLY TOPICALLY AT BEDTIME 05/30/21   Crecencio Mc, MD  urea (CARMOL) 10 % cream Apply topically as needed. 05/28/20   Crecencio Mc, MD    Family History Family History  Problem Relation Age of Onset   Cancer Mother 44       lung, passive smoke   Cancer Father 87       lung, smoker   Cancer Paternal Aunt        breast, lung   Cancer Cousin        breast   Stroke Sister        TIA   Hypertension Sister    Heart failure Sister    Aortic dissection Paternal Uncle    Heart attack Paternal Uncle    AAA (abdominal aortic aneurysm) Paternal Uncle    Aortic dissection Maternal Aunt    Breast cancer Maternal Aunt    Heart disease Paternal Uncle    Stroke Paternal Uncle     Social History Social History   Tobacco Use   Smoking status: Former    Packs/day: 0.30    Years: 7.00    Pack years: 2.10    Types: Cigarettes    Quit date: 10/27/1977    Years since quitting: 43.9   Smokeless tobacco: Never   Tobacco comments:    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 Hx of children who are grown  Vaping Use   Vaping Use: Never used  Substance Use Topics   Alcohol use: Not Currently    Alcohol/week: 0.0 standard drinks   Drug use: No     Allergies   Codeine and Sulfonamide derivatives   Review of Systems Review of Systems  Constitutional:  Negative for  chills and fever.  HENT:  Negative for ear pain and sore throat.   Eyes:  Negative for pain, discharge, redness, itching and visual disturbance.  Respiratory:  Negative for cough and shortness of breath.   Cardiovascular:  Negative for chest pain and palpitations.  Skin:  Positive for rash. Negative for color change and wound.  All other systems reviewed and are negative.   Physical Exam Triage Vital Signs ED Triage  Vitals  Enc Vitals Group     BP 09/28/21 1414 134/66     Pulse Rate 09/28/21 1414 (!) 58     Resp 09/28/21 1414 18     Temp 09/28/21 1414 98.5 F (36.9 C)     Temp Source 09/28/21 1414 Oral     SpO2 09/28/21 1414 95 %     Weight --      Height --      Head Circumference --      Peak Flow --      Pain Score 09/28/21 1425 4     Pain Loc --      Pain Edu? --      Excl. in [place]? --    No data found.  Updated Vital Signs BP 134/66 (BP Location: Left Arm)   Pulse (!) 58   Temp 98.5 F (36.9 C) (Oral)   Resp 18   SpO2 95%   Visual Acuity Right Eye Distance:   Left Eye Distance:   Bilateral Distance:    Right Eye Near:   Left Eye Near:    Bilateral Near:     Physical Exam Vitals and nursing note reviewed.  Constitutional:      General: She is not in acute distress.    Appearance: She is well-developed.  Eyes:     Extraocular Movements: Extraocular movements intact.     Conjunctiva/sclera: Conjunctivae normal.     Pupils: Pupils are equal, round, and reactive to light.  Cardiovascular:     Rate and Rhythm: Normal rate and regular rhythm.     Heart sounds: Normal heart sounds.  Pulmonary:     Effort: Pulmonary effort is normal. No respiratory distress.     Breath sounds: Normal breath sounds.  Musculoskeletal:     Cervical back: Neck supple.  Skin:    General: Skin is warm and dry.     Capillary Refill: Capillary refill takes less than 2 seconds.     Findings: Rash present.     Comments: Dry patchy pink rash on face/upper cheek beside outer eye.  No open wounds or drainage.   Neurological:     Mental Status: She is alert.  Psychiatric:        Mood and Affect: Mood normal.        Behavior: Behavior normal.     UC Treatments / Results  Labs (all labs ordered are listed, but only abnormal results are displayed) Labs Reviewed - No data to display  EKG   Radiology No results found.  Procedures Procedures (including critical care time)  Medications  Ordered in UC Medications - No data to display  Initial Impression / Assessment and Plan / UC Course  I have reviewed the triage vital signs and the nursing notes.  Pertinent labs & imaging results that were available during my care of the patient were reviewed by me and considered in my medical decision making (see chart for details).   Rash.  Treatment with prednisone.  Discussed Benadryl as needed for itching.  Instructed her to  follow-up with PCP or dermatologist if her symptoms are not improving.  She agrees to plan.   Final Clinical Impressions(s) / UC Diagnoses   Final diagnoses:  Rash     Discharge Instructions      Take the prednisone as directed.  Follow up with your primary care provider or a dermatologist if your symptoms are not improving.        ED Prescriptions     Medication Sig Dispense Auth. Provider   predniSONE (STERAPRED UNI-PAK 21 TAB) 10 MG (21) TBPK tablet Take by mouth daily. As directed 21 tablet Sharion Balloon, NP      PDMP not reviewed this encounter.   Sharion Balloon, NP 09/28/21 [phone]

## 2021-10-07 ENCOUNTER — Ambulatory Visit: Payer: Medicare Other | Admitting: Dermatology

## 2021-10-11 DIAGNOSIS — H01132 Eczematous dermatitis of right lower eyelid: Secondary | ICD-10-CM | POA: Diagnosis not present

## 2021-10-11 DIAGNOSIS — L821 Other seborrheic keratosis: Secondary | ICD-10-CM | POA: Diagnosis not present

## 2021-10-11 DIAGNOSIS — D235 Other benign neoplasm of skin of trunk: Secondary | ICD-10-CM | POA: Diagnosis not present

## 2021-11-22 ENCOUNTER — Other Ambulatory Visit: Payer: Self-pay | Admitting: Vascular Surgery

## 2021-11-22 DIAGNOSIS — Z0000 Encounter for general adult medical examination without abnormal findings: Secondary | ICD-10-CM

## 2021-11-22 DIAGNOSIS — Z1231 Encounter for screening mammogram for malignant neoplasm of breast: Secondary | ICD-10-CM

## 2021-12-16 ENCOUNTER — Ambulatory Visit (INDEPENDENT_AMBULATORY_CARE_PROVIDER_SITE_OTHER): Payer: Medicare Other | Admitting: Nurse Practitioner

## 2021-12-16 ENCOUNTER — Other Ambulatory Visit: Payer: Self-pay

## 2021-12-16 ENCOUNTER — Ambulatory Visit (INDEPENDENT_AMBULATORY_CARE_PROVIDER_SITE_OTHER): Payer: Medicare Other

## 2021-12-16 ENCOUNTER — Encounter: Payer: Self-pay | Admitting: Nurse Practitioner

## 2021-12-16 VITALS — BP 122/82 | HR 73 | Temp 97.7°F | Ht 65.0 in | Wt 187.0 lb

## 2021-12-16 DIAGNOSIS — G4733 Obstructive sleep apnea (adult) (pediatric): Secondary | ICD-10-CM

## 2021-12-16 DIAGNOSIS — J45909 Unspecified asthma, uncomplicated: Secondary | ICD-10-CM

## 2021-12-16 DIAGNOSIS — R058 Other specified cough: Secondary | ICD-10-CM | POA: Diagnosis not present

## 2021-12-16 DIAGNOSIS — J019 Acute sinusitis, unspecified: Secondary | ICD-10-CM

## 2021-12-16 DIAGNOSIS — R0602 Shortness of breath: Secondary | ICD-10-CM | POA: Diagnosis not present

## 2021-12-16 DIAGNOSIS — Z9989 Dependence on other enabling machines and devices: Secondary | ICD-10-CM | POA: Diagnosis not present

## 2021-12-16 DIAGNOSIS — R059 Cough, unspecified: Secondary | ICD-10-CM | POA: Diagnosis not present

## 2021-12-16 LAB — CBC WITH DIFFERENTIAL/PLATELET
Basophils Absolute: 0 10*3/uL (ref 0.0–0.1)
Basophils Relative: 0.3 % (ref 0.0–3.0)
Eosinophils Absolute: 0.2 10*3/uL (ref 0.0–0.7)
Eosinophils Relative: 2.2 % (ref 0.0–5.0)
HCT: 40 % (ref 36.0–46.0)
Hemoglobin: 13.3 g/dL (ref 12.0–15.0)
Lymphocytes Relative: 22.6 % (ref 12.0–46.0)
Lymphs Abs: 2 10*3/uL (ref 0.7–4.0)
MCHC: 33.1 g/dL (ref 30.0–36.0)
MCV: 95.1 fl (ref 78.0–100.0)
Monocytes Absolute: 0.9 10*3/uL (ref 0.1–1.0)
Monocytes Relative: 10 % (ref 3.0–12.0)
Neutro Abs: 5.9 10*3/uL (ref 1.4–7.7)
Neutrophils Relative %: 64.9 % (ref 43.0–77.0)
Platelets: 315 10*3/uL (ref 150.0–400.0)
RBC: 4.21 Mil/uL (ref 3.87–5.11)
RDW: 13.4 % (ref 11.5–15.5)
WBC: 9 10*3/uL (ref 4.0–10.5)

## 2021-12-16 LAB — NITRIC OXIDE: FeNO level (ppb): 19

## 2021-12-16 MED ORDER — BUDESONIDE-FORMOTEROL FUMARATE 160-4.5 MCG/ACT IN AERO: 2.0000 | INHALATION_SPRAY | Freq: Two times a day (BID) | RESPIRATORY_TRACT | 0 refills | Status: DC

## 2021-12-16 MED ORDER — SPACER/AERO-HOLDING CHAMBERS DEVI: 0 refills | Status: DC

## 2021-12-16 MED ORDER — PREDNISONE 20 MG PO TABS: 40.0000 mg | ORAL_TABLET | Freq: Every day | ORAL | 0 refills | Status: AC

## 2021-12-16 MED ORDER — DM-GUAIFENESIN ER 30-600 MG PO TB12: 1.0000 | ORAL_TABLET | Freq: Two times a day (BID) | ORAL | 0 refills | Status: DC | PRN

## 2021-12-16 MED ORDER — AZELASTINE HCL 0.1 % NA SOLN: 2.0000 | Freq: Two times a day (BID) | NASAL | 2 refills | Status: DC

## 2021-12-16 MED ORDER — DOXYCYCLINE HYCLATE 100 MG PO TABS: 100.0000 mg | ORAL_TABLET | Freq: Two times a day (BID) | ORAL | 0 refills | Status: AC

## 2021-12-16 MED ORDER — BENZONATATE 200 MG PO CAPS: 200.0000 mg | ORAL_CAPSULE | Freq: Three times a day (TID) | ORAL | 1 refills | Status: DC | PRN

## 2021-12-16 MED ORDER — BUDESONIDE-FORMOTEROL FUMARATE 160-4.5 MCG/ACT IN AERO: 2.0000 | INHALATION_SPRAY | Freq: Two times a day (BID) | RESPIRATORY_TRACT | 6 refills | Status: DC

## 2021-12-16 NOTE — Assessment & Plan Note (Signed)
FeNO nl. ACT 9. Suspect symptoms multifactorial. Will step up therapy. CBC with diff and IgE today. Prednisone burst. Continue singulair and allegra.

## 2021-12-16 NOTE — Addendum Note (Signed)
Addended by: Fran Lowes on: 12/16/2021 04:52 PM   Modules accepted: Orders

## 2021-12-16 NOTE — Assessment & Plan Note (Signed)
Cough and exam findings consistent with upper airway irritation. Will switch from DPI and step up to ICS/LABA HFA given ACT score/irriation. Advised to use with spacer. Optimize postnasal drainage control. Prednisone burst.

## 2021-12-16 NOTE — Assessment & Plan Note (Signed)
Great compliance in past. Denies any difficulties of recent. Will contact DME regarding download info.

## 2021-12-16 NOTE — Assessment & Plan Note (Addendum)
Flare in symptoms with change in weather. Worsening of nasal congestion/drainage - will tx for bacterial coinfection given >10 days with doxy course. Supportive care. Felt astelin worked well in past but was concerned about thrush. Discussed trialing and notifying if thrush develops.   Patient Instructions  -Continue Albuterol inhaler 2 puffs every 6 hours as needed for shortness of breath or wheezing. Notify if symptoms persist despite rescue inhaler/neb use. -Continue flonase nasal spray 2 sprays each nostril daily -Continue singulair 10 mg At bedtime -Continue Allegra 180 mg daily   Stop Qvar. Start Symbicort 2 puffs Twice daily with spacer. Brush tongue and rinse mouth afterwards -Start Astelin nasal spray 2 sprays each nostril Twice daily  -Start nasal irrigations (netti pot) 1-2 times per day -Start prednisone 40 mg daily for 5 days. Take in AM with food. -Doxycycline 100 mg Twice daily for 7 days. Notify immediately of any rash, itching, hives, or swelling, or seek emergency care.Finish your antibiotics in their entirety. Do not stop just because symptoms improve. Take with food to reduce GI upset. Avoid direct sun exposure and wear sunscreen while taking.  -Tessalon perles 200 mg Three times a day as needed for cough -Mucinex DM Twice daily as needed for cough and congestion  Labs today - CBC with diff and IgE  Asthma Action Plan in place Rinse mouth after inhaled corticosteroid use.  Avoid triggers, when able.  Exercise encouraged. Notify if worsening symptoms upon exertion.  Notify and seek help if symptoms unrelieved by rescue inhaler.  Continue CPAP 11cmH2O nightly for minimum 4-6 hours  Be aware of any drowsiness when driving and notify of excessive daytime fatigue symptoms.   Follow up in 1 month with Dr. Halford Chessman or Alanson Aly. If symptoms do not improve or worsen, please contact office for sooner follow up or seek emergency care.

## 2021-12-16 NOTE — Patient Instructions (Addendum)
-  Continue Albuterol inhaler 2 puffs every 6 hours as needed for shortness of breath or wheezing. Notify if symptoms persist despite rescue inhaler/neb use. -Continue flonase nasal spray 2 sprays each nostril daily -Continue singulair 10 mg At bedtime -Continue Allegra 180 mg daily   Stop Qvar. Start Symbicort 2 puffs Twice daily with spacer. Brush tongue and rinse mouth afterwards -Start Astelin nasal spray 2 sprays each nostril Twice daily  -Start nasal irrigations (netti pot) 1-2 times per day -Start prednisone 40 mg daily for 5 days. Take in AM with food. -Doxycycline 100 mg Twice daily for 7 days. Notify immediately of any rash, itching, hives, or swelling, or seek emergency care.Finish your antibiotics in their entirety. Do not stop just because symptoms improve. Take with food to reduce GI upset. Avoid direct sun exposure and wear sunscreen while taking.  -Tessalon perles 200 mg Three times a day as needed for cough -Mucinex DM Twice daily as needed for cough and congestion  Labs today - CBC with diff and IgE  Asthma Action Plan in place Rinse mouth after inhaled corticosteroid use.  Avoid triggers, when able.  Exercise encouraged. Notify if worsening symptoms upon exertion.  Notify and seek help if symptoms unrelieved by rescue inhaler.  Continue CPAP 11cmH2O nightly for minimum 4-6 hours  Be aware of any drowsiness when driving and notify of excessive daytime fatigue symptoms.   Follow up in 1 month with Dr. Halford Chessman or Alanson Aly. If symptoms do not improve or worsen, please contact office for sooner follow up or seek emergency care.

## 2021-12-16 NOTE — Progress Notes (Signed)
@Patient  ID: Angela Mendoza, female    DOB: [birth year], 71 y.o.   MRN: [id number]  Chief Complaint  Patient presents with   Follow-up    Patient says she's congested, shortness of breath is getting worse, and the cough has been going on since she has had covid last year.     Referring provider: Crecencio Mc, MD  HPI: 71 year old female, former smoker followed for OSA on CPAP, allergic rhinitis and asthma.  She is a patient of Dr. Juanetta Gosling and last seen in office 08/07/2021.  Past medical history significant for hypertension, history of A-fib, GERD, hypothyroidism, arthritis, HLD.   TEST/EVENTS:  02/06/2016 PFTs: FVC 3.04 (92), FEV1 2.32 (92), ratio 76, DLCOunc 121%. No BD. Overall normal spirometry  01/23/2021 echocardiogram: EF 60-65%. GIIDD. Mildly elevated PASP. Mild MR.  04/04/2021 Coronary CT: atherosclerosis. Tortuous thoracic aorta. 2 mm LLL pulmonary nodule, considered benign. Subpleural 2-3 mm LLL nodule, benign. Lateral right breast nodule 1.3 cm, likely intramammary lymph node.   08/07/2021: OV with Dr. Halford Chessman. Breathing stable and without cough. Contiued on Qvar and albuterol PRN. Recurrent thrush - tx with diflucan course. Continued singular and allegra.   12/16/2021: Today- acute visit Patient presents today for worsening congestion and shortness of breath.  Feels as though her breathing has been progressively worse since having COVID in April of last year.  She has a persistent cough, usually non-productive. She has been having significant sinus symptoms since the change of weather occurred, over the past few weeks. She has green to yellow nasal drainage. She does report some headaches and sinus pressure. Her shortness of breath is upon exertion but also some at night. She has an occasional wheeze. She denies any fevers or recent sick exposures. She denies any orthopnea, PND, chest pain or lower extremity swelling. She continues on Qvar and uses her albuterol inhaler a few times a  day. She continues on Singulair, Flonase, and Allegra daily. She continues on CPAP nightly and denies any issues. Past downloads  have showed compliance; unable to obtain today - will contact DME.   ACT 9 FeNO 19 ppb  Allergies  Allergen Reactions   Codeine Other (See Comments)    Low blood pressure/nausea   Sulfonamide Derivatives Rash    rash    Immunization History  Administered Date(s) Administered   Fluad Quad(high Dose 65+) 07/16/2019, 07/20/2020, 08/05/2021   Influenza Split 07/15/2013, 07/12/2015, 07/14/2016, 08/04/2017   Influenza Whole 07/27/2009, 07/28/2011, 07/21/2012   Influenza, High Dose Seasonal PF 07/22/2017, 07/01/2018   Influenza,inj,Quad PF,6+ Mos 08/03/2014, 07/12/2015, 07/14/2016   Influenza-Unspecified 07/15/2013, 08/03/2014, 07/12/2015, 07/14/2016, 08/04/2017   Moderna SARS-COV2 Booster Vaccination 08/17/2020, 02/01/2021   Moderna Sars-Covid-2 Vaccination 12/11/2019, 01/08/2020, 07/25/2021   Pneumococcal Conjugate-13 11/08/2014   Pneumococcal Polysaccharide-23 07/27/2009, 12/27/2015   Td 10/27/2008   Tdap 12/19/2013, 02/25/2017   Tetanus 10/27/2008   Zoster Recombinat (Shingrix) 04/27/2020, 11/26/2020   Zoster, Live 10/27/2009    Past Medical History:  Diagnosis Date   Agatston coronary artery calcium score less than 100    a. 06/2018 Cardiac CT: Ca2+ = 3 (49th %'ile).   Allergic rhinitis 06/09/2008   chronic   Aortic atherosclerosis ([place])    a. 06/2018 noted on chest CT.   Asthma, intrinsic 10/06/2011   controlled on qvar. [name] 09/2011:  Very mild airflow obstruction (FEV1% 68, FVL c/w obstruction)    Basal cell carcinoma 06/10/2021   right prox nasal ala rim - MOHs 07/12/21 Dr. Lacinda Axon   Chronic headache 06/09/2008  COPD (chronic obstructive pulmonary disease) ([place])    Depression    Diverticulosis 2013   h/o diverticulitis   Dry eyes    GERD (gastroesophageal reflux disease)    History of breast implant removal    silicone mastitis - R axilla  silicone LN, free silicone L breast upper outer quadrant   Hot flashes    HYPERLIPIDEMIA 06/09/2008   HYPERTENSION 06/09/2008   Hypothyroidism    MVP (mitral valve prolapse)    OSA on CPAP    [name]   PAF (paroxysmal atrial fibrillation) ([place]) 06/09/2008   a. CHA2DS2VASc = 3-->prefers ASA.   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 Hx of children who are grown   Outpatient Medications Prior to Visit  Medication Sig Dispense Refill   albuterol (PROVENTIL HFA) 108 (90 Base) MCG/ACT inhaler Inhale 2 puffs into the lungs every 6 (six) hours as needed. 18 g 2   ALPRAZolam (XANAX) 0.25 MG tablet TAKE 1 TABLET (0.25 MG TOTAL) BY MOUTH ONCE DAILY AS NEEDED FOR SLEEP. 30 tablet 5   atorvastatin (LIPITOR) 40 MG tablet Take 1 tablet (40 mg total) by mouth daily. 90 tablet 3   Cholecalciferol (VITAMIN D3) 50 MCG (2000 UT) TABS Take 1 tablet by mouth daily. 90 tablet 3   Coenzyme Q10 (COQ10 PO) Take by mouth daily.     conjugated estrogens (PREMARIN) vaginal cream Place 1 Applicatorful vaginally 2 (two) times a week. 90 day supply,  Refill for one year 90 g 3   cyanocobalamin (,VITAMIN B-12,) 1000 MCG/ML injection Inject 1 mL (1,000 mcg total) into the muscle every 30 (thirty) days. 3 mL 3   cycloSPORINE (RESTASIS) 0.05 % ophthalmic emulsion Place 1 drop into both eyes 2 (two) times daily. 3 each 3   diazepam (VALIUM) 5 MG tablet Take 1 tablet (5 mg total) by mouth daily as needed for anxiety.  30 tablet 2   Docusate Calcium (STOOL SOFTENER PO) Take 1 tablet by mouth as needed.     EPINEPHrine 0.3 mg/0.3 mL IJ SOAJ injection EpiPen 2-Pak 0.3 mg/0.3 mL injection, auto-injector   0.3 mg by injection route.     estradiol (VIVELLE-DOT) 0.1 MG/24HR patch Place 1 patch (0.1 mg total) onto the skin 2 (two) times a week. 24 patch 3   famotidine (PEPCID) 20 MG tablet Take 1 tablet (20 mg total) by mouth daily as needed for heartburn or indigestion. 90 tablet 3   fexofenadine (ALLEGRA ALLERGY) 180 MG tablet Take 1 tablet (180 mg total) by mouth daily. 90 tablet 3   Fluoxetine HCl, PMDD, 10 MG TABS Take 1 tablet (10 mg total) by mouth daily. 90 tablet 1   levothyroxine (SYNTHROID) 50 MCG tablet Take 1 tablet (50 mcg total) by mouth daily before breakfast. 90 tablet 3   losartan (COZAAR) 50 MG tablet Take 1 tablet (50 mg total) by mouth at bedtime. 90 tablet 3  MAGNESIUM PO Take 250 mg by mouth daily.     meclizine (ANTIVERT) 25 MG tablet Take 1 tablet (25 mg total) by mouth 3 (three) times daily as needed. 270 tablet 3   metoprolol succinate (TOPROL-XL) 50 MG 24 hr tablet Take 1 tablet (50 mg total) by mouth daily. 90 tablet 3   montelukast (SINGULAIR) 10 MG tablet Take 1 tablet (10 mg total) by mouth at bedtime. 90 tablet 3   Multiple Vitamin (MULTIVITAMIN) tablet Take 1 tablet by mouth daily.     ondansetron (ZOFRAN ODT) 4 MG disintegrating tablet Take 1 tablet (4 mg total) by mouth every 8 (eight) hours as needed for nausea or vomiting. 30 tablet 2   Propylene Glycol 0.6 % SOLN Apply 1 drop to eye as needed.     psyllium (METAMUCIL) 58.6 % powder Take 1 packet by mouth as needed.     Spacer/Aero-Holding Chambers (AEROCHAMBER MV) inhaler Use as instructed 1 each 3   Syringe/Needle, Disp, (SYRINGE 3CC/20GX1") 20G X 1" 3 ML MISC 1 Syringe by Does not apply route every 30 (thirty) days. For use with b-12 injection monthly 50 each 0   tretinoin (RETIN-A) 0.05 % cream APPLY TOPICALLY AT BEDTIME 45 g  1   urea (CARMOL) 10 % cream Apply topically as needed. 71 g 3   beclomethasone (QVAR REDIHALER) 80 MCG/ACT inhaler Inhale 2 puffs into the lungs 2 (two) times daily. 31.8 g 3   azelastine (ASTELIN) 0.1 % nasal spray Place 1 spray into both nostrils 2 (two) times daily. Use in each nostril as directed (Patient not taking: Reported on 12/16/2021) 30 mL 3   mupirocin ointment (BACTROBAN) 2 % Apply to aa's QD until healed. 22 g 3   predniSONE (STERAPRED UNI-PAK 21 TAB) 10 MG (21) TBPK tablet Take by mouth daily. As directed 21 tablet 0   No facility-administered medications prior to visit.     Review of Systems:   Constitutional: No weight loss or gain, night sweats, fevers, chills. +fatigue (associates it d/t coughing at night) HEENT: No difficulty swallowing, tooth/dental problems, or sore throat. No sneezing, itching, ear ache. +headaches, sinus pressure, nasal congestion and drainage CV:  +PND. No chest pain, orthopnea, swelling in lower extremities, anasarca, dizziness, palpitations, syncope Resp: +shortness of breath with exertion; wheezing; dry, hacking cough. No excess mucus or change in color of mucus. No hemoptysis. No wheezing.  No chest wall deformity GI:  No heartburn, indigestion, abdominal pain, nausea, vomiting, diarrhea, change in bowel habits, loss of appetite, bloody stools.  GU: No dysuria, change in color of urine, urgency or frequency.  No flank pain, no hematuria  Skin: No rash, lesions, ulcerations MSK:  No joint pain or swelling.  No decreased range of motion.  No back pain. Neuro: No dizziness or lightheadedness.  Psych: No depression or anxiety. Mood stable.     Physical Exam:  BP 122/82 (BP Location: Left Arm, Patient Position: Sitting, Cuff Size: Normal)    Pulse 73    Temp 97.7 F (36.5 C) (Oral)    Ht 5\' 5"  (1.651 m)    Wt 187 lb (84.8 kg)    SpO2 97%    BMI 31.12 kg/m   GEN: Pleasant, interactive, well-appearing; obese; in no acute distress. HEENT:   Normocephalic and atraumatic. EACs patent bilaterally. TM pearly gray with present light reflex bilaterally. PERRLA. Sclera white. Nasal turbinates pink, moist and patent bilaterally. Clear rhinorrhea present. Oropharynx erythematous and moist, without exudate or edema. No lesions, ulcerations  NECK:  Supple w/ fair ROM. No JVD present. Normal carotid impulses w/o bruits. Thyroid symmetrical with no goiter or nodules palpated. No lymphadenopathy.   CV: RRR, no m/r/g, no peripheral edema. Pulses intact, +2 bilaterally. No cyanosis, pallor or clubbing. PULMONARY:  Unlabored, regular breathing. Clear bilaterally A&P w/o wheezes/rales/rhonchi. No accessory muscle use. No dullness to percussion. GI: BS present and normoactive. Soft, non-tender to palpation. No organomegaly or masses detected. No CVA tenderness. MSK: No erythema, warmth or tenderness. Cap refil <2 sec all extrem. No deformities or joint swelling noted.  Neuro: A/Ox3. No focal deficits noted.   Skin: Warm, no lesions or rashe Psych: Normal affect and behavior. Judgement and thought content appropriate.     Lab Results:  CBC    Component Value Date/Time   WBC 8.1 12/29/2020 1419   RBC 4.41 12/29/2020 1419   HGB 13.6 12/29/2020 1419   HGB 13.7 12/07/2012 0000   HCT 40.6 12/29/2020 1419   PLT 298 12/29/2020 1419   MCV 92.1 12/29/2020 1419   MCH 30.8 12/29/2020 1419   MCHC 33.5 12/29/2020 1419   RDW 12.5 12/29/2020 1419   LYMPHSABS 2,079 04/28/2018 1017   MONOABS 0.8 05/26/2017 1703   EOSABS 36 04/28/2018 1017   BASOSABS 27 04/28/2018 1017    BMET    Component Value Date/Time   NA 136 05/29/2021 1126   NA 142 09/26/2016 0000   K 4.3 05/29/2021 1126   CL 100 05/29/2021 1126   CO2 28 05/29/2021 1126   GLUCOSE 99 05/29/2021 1126   BUN 10 05/29/2021 1126   BUN 12 09/26/2016 0000   CREATININE 0.84 05/29/2021 1126   CREATININE 0.96 04/12/2013 0000   CALCIUM 9.3 05/29/2021 1126   GFRNONAA >60 12/29/2020 1419    BNP No  results found for: BNP   Imaging:  No results found.    PFT Results Latest Ref Rng & Units 02/06/2016  FVC-Pre L 2.98  FVC-Predicted Pre % 90  FVC-Post L 3.04  FVC-Predicted Post % 92  Pre FEV1/FVC % % 74  Post FEV1/FCV % % 76  FEV1-Pre L 2.19  FEV1-Predicted Pre % 87  FEV1-Post L 2.32  DLCO uncorrected ml/min/mmHg 31.04  DLCO UNC% % 121  DLVA Predicted % 87    Lab Results  Component Value Date   NITRICOXIDE 7 12/01/2018        Assessment & Plan:   Acute rhinosinusitis Flare in symptoms with change in weather. Worsening of nasal congestion/drainage - will tx for bacterial coinfection given >10 days with doxy course. Supportive care. Felt astelin worked well in past but was concerned about thrush. Discussed trialing and notifying if thrush develops.   Patient Instructions  -Continue Albuterol inhaler 2 puffs every 6 hours as needed for shortness of breath or wheezing. Notify if symptoms persist despite rescue inhaler/neb use. -Continue flonase nasal spray 2 sprays each nostril daily -Continue singulair 10 mg At bedtime -Continue Allegra 180 mg daily   Stop Qvar. Start Symbicort 2 puffs Twice daily with spacer. Brush tongue and rinse mouth afterwards -Start Astelin nasal spray 2 sprays each nostril Twice daily  -Start nasal irrigations (netti pot) 1-2 times per day -Start prednisone 40 mg daily for 5 days. Take in AM with food. -Doxycycline 100 mg Twice daily for 7 days. Notify immediately of any rash, itching, hives, or swelling, or seek emergency care.Finish your antibiotics in their entirety. Do not stop just because symptoms improve. Take with food to reduce GI upset. Avoid direct sun  exposure and wear sunscreen while taking.  -Tessalon perles 200 mg Three times a day as needed for cough -Mucinex DM Twice daily as needed for cough and congestion  Labs today - CBC with diff and IgE  Asthma Action Plan in place Rinse mouth after inhaled corticosteroid use.   Avoid triggers, when able.  Exercise encouraged. Notify if worsening symptoms upon exertion.  Notify and seek help if symptoms unrelieved by rescue inhaler.  Continue CPAP 11cmH2O nightly for minimum 4-6 hours  Be aware of any drowsiness when driving and notify of excessive daytime fatigue symptoms.   Follow up in 1 month with Dr. Halford Chessman or Alanson Aly. If symptoms do not improve or worsen, please contact office for sooner follow up or seek emergency care.    Upper airway cough syndrome Cough and exam findings consistent with upper airway irritation. Will switch from DPI and step up to ICS/LABA HFA given ACT score/irriation. Advised to use with spacer. Optimize postnasal drainage control. Prednisone burst.   Asthma, intrinsic FeNO nl. ACT 9. Suspect symptoms multifactorial. Will step up therapy. CBC with diff and IgE today. Prednisone burst. Continue singulair and allegra.   OSA on CPAP Great compliance in past. Denies any difficulties of recent. Will contact DME regarding download info.      Clayton Bibles, NP 12/16/2021  Pt aware and understands NP's role.

## 2021-12-16 NOTE — Progress Notes (Signed)
Reviewed and agree with assessment/plan.   Chesley Mires, MD [facility] Pulmonary/Critical Care 12/16/2021, 5:06 PM Pager:  [phone]

## 2021-12-17 LAB — IGE: IgE (Immunoglobulin E), Serum: 23 kU/L (ref <=114)

## 2021-12-18 NOTE — Progress Notes (Signed)
IgE and CBC with diff normal. Slight elevation in eosinophils; however, we just stepped up her therapy so wouldn't add anything at this time. Thanks!

## 2021-12-23 NOTE — Progress Notes (Signed)
Did she switch to the Symbicort? She should have stopped the Qvar and started Symbicort 160 2 puffs Twice daily. I had added the spacer to hopefully reduce upper airway irritation, but if she has switched to the Symbicort and the change from the dry powder to the [facility] has been sufficient, I'm fine with her holding off on using the spacer right now.

## 2021-12-26 MED ORDER — SPACER/AERO-HOLD CHAMBER BAGS MISC: 2.0000 | Freq: Two times a day (BID) | 0 refills | Status: DC

## 2021-12-26 NOTE — Progress Notes (Signed)
Can we follow up on this? Thanks!

## 2021-12-26 NOTE — Addendum Note (Signed)
Addended by: Darliss Ridgel on: 12/26/2021 12:16 PM   Modules accepted: Orders

## 2021-12-30 ENCOUNTER — Ambulatory Visit: Payer: Medicare Other | Admitting: Nurse Practitioner

## 2022-01-01 ENCOUNTER — Ambulatory Visit (INDEPENDENT_AMBULATORY_CARE_PROVIDER_SITE_OTHER): Payer: Medicare Other | Admitting: Nurse Practitioner

## 2022-01-01 ENCOUNTER — Other Ambulatory Visit: Payer: Self-pay

## 2022-01-01 ENCOUNTER — Encounter: Payer: Self-pay | Admitting: Nurse Practitioner

## 2022-01-01 VITALS — BP 118/82 | HR 78 | Wt 188.0 lb

## 2022-01-01 DIAGNOSIS — B370 Candidal stomatitis: Secondary | ICD-10-CM

## 2022-01-01 DIAGNOSIS — Z9989 Dependence on other enabling machines and devices: Secondary | ICD-10-CM | POA: Diagnosis not present

## 2022-01-01 DIAGNOSIS — G4733 Obstructive sleep apnea (adult) (pediatric): Secondary | ICD-10-CM

## 2022-01-01 DIAGNOSIS — R058 Other specified cough: Secondary | ICD-10-CM | POA: Diagnosis not present

## 2022-01-01 DIAGNOSIS — J310 Chronic rhinitis: Secondary | ICD-10-CM | POA: Diagnosis not present

## 2022-01-01 DIAGNOSIS — J45909 Unspecified asthma, uncomplicated: Secondary | ICD-10-CM | POA: Diagnosis not present

## 2022-01-01 MED ORDER — FLUCONAZOLE 100 MG PO TABS: ORAL_TABLET | ORAL | 0 refills | Status: DC

## 2022-01-01 MED ORDER — SPACER/AERO-HOLDING CHAMBERS DEVI: 2 refills | Status: DC

## 2022-01-01 NOTE — Assessment & Plan Note (Addendum)
Evidence of thrush on exam.  Reports that nystatin usually does not work for her.  Diflucan course sent.  Hope changed to Symbicort with spacer will help prevent further recurrence. ?

## 2022-01-01 NOTE — Patient Instructions (Addendum)
-  Continue Albuterol inhaler 2 puffs every 6 hours as needed for shortness of breath or wheezing. Notify if symptoms persist despite rescue inhaler/neb use. ?-Continue flonase nasal spray 2 sprays each nostril daily ?-Continue singulair 10 mg At bedtime ?-Continue Allegra 180 mg daily  ? ?-Start Symbicort 2 puffs Twice daily with spacer. Brush tongue and rinse mouth afterwards ?-Continue Astelin nasal spray 2 sprays each nostril Twice daily  ?-Continue nasal irrigations (netti pot) 1-2 times per day ?-Continue mucinex DM Twice daily as needed for cough and congestion ? ?-Chlortab 4 mg At bedtime for cough  ?  ?Asthma Action Plan in place ?Rinse mouth after inhaled corticosteroid use.  ?Avoid triggers, when able.  ?Exercise encouraged. Notify if worsening symptoms upon exertion.  ?Notify and seek help if symptoms unrelieved by rescue inhaler. ?  ?Continue CPAP 11cmH2O nightly for minimum 4-6 hours  ?Be aware of any drowsiness when driving and notify of excessive daytime fatigue symptoms.  ? ?CT sinus - someone will contact you for scheduling  ?  ?Follow up in 3 months with Dr. Halford Chessman or Alanson Aly. If symptoms do not improve or worsen, please contact office for sooner follow up or seek emergency care. ?

## 2022-01-01 NOTE — Progress Notes (Signed)
$'@Patient'B$  ID: Angela Mendoza, female    DOB: 1950-12-03, 71 y.o.   MRN: [id number]  Chief Complaint  Patient presents with   Asthma    Referring provider: Crecencio Mc, MD  HPI: 71 year old female, former smoker followed for OSA on CPAP, allergic rhinitis and asthma.  She is a patient of Dr. Juanetta Mendoza and last seen in office 12/16/2021 by [facility] NP.  Past medical history significant for hypertension, history of A-fib, GERD, hypothyroidism, arthritis, HLD  TEST/EVENTS:  02/06/2016 PFTs: FVC 3.04 (92), FEV1 2.32 (92), ratio 76, DLCOunc 121%.  No BD.  Overall normal spirometry 01/23/2021 echocardiogram: EF 60 to 65%.  G2 DD.  Mildly elevated PASP.  Mild MR. 04/04/2021 coronary CT: Atherosclerosis.  Tortuous thoracic aorta.  2 mm left lower lobe pulmonary nodule, considered benign.  Subpleural 2 to 3 mm LLL nodule, benign.  Lateral right breast nodule 1.3 cm, likely intramammary lymph node  08/07/2021: OV with Dr. Halford Mendoza.  Breathing stable and without cough.  Continued on Qvar and albuterol as needed.  Recurrent thrush-treated with Diflucan course.  Continued Singulair and Allegra  12/16/2021: OV with Angela Whitehorn NP.  Worsening congestion and shortness of breath.  Felt as though her breathing had been progressively worse since having COVID in April of last year.  Persistent cough, usually nonproductive and significant sinus symptoms with green-yellow nasal drainage.  Headaches and sinus pressure.  Does notice an occasional wheeze.  FeNO was normal.  Does get good response from albuterol and is using it multiple times a day.  Stepped up from Qvar to Symbicort.  Advised to use with spacer.  Treated with Doxy course and short prednisone burst.  Optimize postnasal drainage and cough suppression.  CBC with differential and IgE normal.  Continued on CPAP 11 cmH2O nightly with good response.  01/01/2022: Today-follow-up Patient reports that she is feeling better since being seen last time.  She does note that her nasal  drainage got worse after stopping Flonase and Astelin prior to our last visit.  She feels as though the symptoms have improved since restarting however she continues to have persistent drainage that feels like a dripping faucet.  She was previously evaluated by ENT and told that most of her symptoms were allergy related.  Her most recent CBC did not have significant eosinophilia and her IgE was normal.  She is maintained on Allegra and Singulair.  She does feel like her cough has improved but is still present.  Remains nonproductive.  Was doing [employer] which helped but they began to upset her stomach.  She has restarted taking Mucinex DM which has helped as well.  She never switched from Qvar to Symbicort.  Does still occasionally use albuterol.  Discussed her concerns with Symbicort today.  She is concerned that she might have thrush again.  From OSA standpoint, she is compliant with therapy and uses her machine nightly.  She did have some issues with her CPAP machine and has finally gotten a new one that seems to working well.  She denies any excessive daytime fatigue, drowsy driving or morning headaches.  Allergies  Allergen Reactions   Codeine Other (See Comments)    Low blood pressure/nausea   Sulfonamide Derivatives Rash    rash    Immunization History  Administered Date(s) Administered   Fluad Quad(high Dose 65+) 07/16/2019, 07/20/2020, 08/05/2021   Influenza Split 07/15/2013, 07/12/2015, 07/14/2016, 08/04/2017   Influenza Whole 07/27/2009, 07/28/2011, 07/21/2012   Influenza, High Dose Seasonal PF 07/22/2017, 07/01/2018  Influenza,inj,Quad PF,6+ Mos 08/03/2014, 07/12/2015, 07/14/2016   Influenza-Unspecified 07/15/2013, 08/03/2014, 07/12/2015, 07/14/2016, 08/04/2017   Moderna SARS-COV2 Booster Vaccination 08/17/2020, 02/01/2021   Moderna Sars-Covid-2 Vaccination 12/11/2019, 01/08/2020, 07/25/2021   Pneumococcal Conjugate-13 11/08/2014   Pneumococcal Polysaccharide-23 07/27/2009,  12/27/2015   Td 10/27/2008   Tdap 12/19/2013, 02/25/2017   Tetanus 10/27/2008   Zoster Recombinat (Shingrix) 04/27/2020, 11/26/2020   Zoster, Live 10/27/2009    Past Medical History:  Diagnosis Date   Agatston coronary artery calcium score less than 100    a. 06/2018 Cardiac CT: Ca2+ = 3 (49th %'ile).   Allergic rhinitis 06/09/2008   chronic   Aortic atherosclerosis ([place])    a. 06/2018 noted on chest CT.   Asthma, intrinsic 10/06/2011   controlled on qvar. [name] 09/2011:  Very mild airflow obstruction (FEV1% 68, FVL c/w obstruction)    Basal cell carcinoma 06/10/2021   right prox nasal ala rim - MOHs 07/12/21 Angela Mendoza   Chronic headache 06/09/2008   COPD (chronic obstructive pulmonary disease) ([place])    Depression    Diverticulosis 2013   h/o diverticulitis   Dry eyes    GERD (gastroesophageal reflux disease)    History of breast implant removal    silicone mastitis - R axilla silicone LN, free silicone L breast upper outer quadrant   Hot flashes    HYPERLIPIDEMIA 06/09/2008   HYPERTENSION 06/09/2008   Hypothyroidism    MVP (mitral valve prolapse)    OSA on CPAP    [name]   PAF (paroxysmal atrial fibrillation) ([place]) 06/09/2008   a. CHA2DS2VASc = 3-->prefers ASA.   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 Hx of children who are grown   Outpatient Medications Prior to Visit  Medication Sig Dispense Refill   albuterol (PROVENTIL HFA) 108 (90 Base) MCG/ACT inhaler  Inhale 2 puffs into the lungs every 6 (six) hours as needed. 18 g 2   ALPRAZolam (XANAX) 0.25 MG tablet TAKE 1 TABLET (0.25 MG TOTAL) BY MOUTH ONCE DAILY AS NEEDED FOR SLEEP. 30 tablet 5   atorvastatin (LIPITOR) 40 MG tablet Take 1 tablet (40 mg total) by mouth daily. 90 tablet 3   azelastine (ASTELIN) 0.1 % nasal spray Place 2 sprays into both nostrils 2 (two) times daily. Use in each nostril as directed 30 mL 2   beclomethasone (QVAR) 80 MCG/ACT inhaler      benzonatate (TESSALON) 200 MG capsule Take 1 capsule (200 mg total) by mouth 3 (three) times daily as needed for cough. 30 capsule 1   budesonide-formoterol (SYMBICORT) 160-4.5 MCG/ACT inhaler Inhale 2 puffs into the lungs in the morning and at bedtime. 1 each 0   Cholecalciferol (VITAMIN D3) 50 MCG (2000 UT) TABS Take 1 tablet by mouth daily. 90 tablet 3   Coenzyme Q10 (COQ10 PO) Take by mouth daily.     conjugated estrogens (PREMARIN) vaginal cream Place 1 Applicatorful vaginally 2 (two) times a  week. 90 day supply,  Refill for one year 90 g 3   cyanocobalamin (,VITAMIN B-12,) 1000 MCG/ML injection Inject 1 mL (1,000 mcg total) into the muscle every 30 (thirty) days. 3 mL 3   cycloSPORINE (RESTASIS) 0.05 % ophthalmic emulsion Place 1 drop into both eyes 2 (two) times daily. 3 each 3   dextromethorphan-guaiFENesin (MUCINEX DM) 30-600 MG 12hr tablet Take 1 tablet by mouth 2 (two) times daily as needed for cough. 60 tablet 0   diazepam (VALIUM) 5 MG tablet Take 1 tablet (5 mg total) by mouth daily as needed for anxiety. 30 tablet 2   Docusate Calcium (STOOL SOFTENER PO) Take 1 tablet by mouth as needed.     EPINEPHrine 0.3 mg/0.3 mL IJ SOAJ injection EpiPen 2-Pak 0.3 mg/0.3 mL injection, auto-injector   0.3 mg by injection route.     estradiol (VIVELLE-DOT) 0.1 MG/24HR patch Place 1 patch (0.1 mg total) onto the skin 2 (two) times a week. 24 patch 3   famotidine (PEPCID) 20 MG tablet Take 1 tablet (20 mg total) by mouth daily as needed for  heartburn or indigestion. 90 tablet 3   fexofenadine (ALLEGRA ALLERGY) 180 MG tablet Take 1 tablet (180 mg total) by mouth daily. 90 tablet 3   Fluoxetine HCl, PMDD, 10 MG TABS Take 1 tablet (10 mg total) by mouth daily. 90 tablet 1   fluticasone (FLONASE) 50 MCG/ACT nasal spray Place 2 sprays into both nostrils daily.     levothyroxine (SYNTHROID) 50 MCG tablet Take 1 tablet (50 mcg total) by mouth daily before breakfast. 90 tablet 3   losartan (COZAAR) 50 MG tablet Take 1 tablet (50 mg total) by mouth at bedtime. 90 tablet 3   MAGNESIUM PO Take 250 mg by mouth daily.     meclizine (ANTIVERT) 25 MG tablet Take 1 tablet (25 mg total) by mouth 3 (three) times daily as needed. 270 tablet 3   metoprolol succinate (TOPROL-XL) 50 MG 24 hr tablet Take 1 tablet (50 mg total) by mouth daily. 90 tablet 3   montelukast (SINGULAIR) 10 MG tablet Take 1 tablet (10 mg total) by mouth at bedtime. 90 tablet 3   Multiple Vitamin (MULTIVITAMIN) tablet Take 1 tablet by mouth daily.     ondansetron (ZOFRAN ODT) 4 MG disintegrating tablet Take 1 tablet (4 mg total) by mouth every 8 (eight) hours as needed for nausea or vomiting. 30 tablet 2   Propylene Glycol 0.6 % SOLN Apply 1 drop to eye as needed.     psyllium (METAMUCIL) 58.6 % powder Take 1 packet by mouth as needed.     Spacer/Aero-Hold Chamber Bags MISC 2 puffs by Does not apply route 2 (two) times daily. 1 each 0   Syringe/Needle, Disp, (SYRINGE 3CC/20GX1") 20G X 1" 3 ML MISC 1 Syringe by Does not apply route every 30 (thirty) days. For use with b-12 injection monthly 50 each 0   tretinoin (RETIN-A) 0.05 % cream APPLY TOPICALLY AT BEDTIME 45 g 1   urea (CARMOL) 10 % cream Apply topically as needed. 71 g 3   Spacer/Aero-Holding Chambers (AEROCHAMBER MV) inhaler Use as instructed 1 each 3   Spacer/Aero-Holding Chambers DEVI Use with inhaler 1 each 0   mupirocin ointment (BACTROBAN) 2 % Apply topically.     budesonide-formoterol (SYMBICORT) 160-4.5 MCG/ACT  inhaler Inhale 2 puffs into the lungs in the morning and at bedtime. 1 each 6   No facility-administered medications prior to visit.     Review of  Systems:   Constitutional: No weight loss or gain, night sweats, fevers, chills, fatigue, or lassitude. HEENT: No headaches, difficulty swallowing, tooth/dental problems, or sore throat. No sneezing, itching, ear ache +nasal congestion/drainage, post nasal drip CV:  No chest pain, orthopnea, PND, swelling in lower extremities, anasarca, dizziness, palpitations, syncope Resp: + Occasional wheeze, nonproductive cough.  No shortness of breath with exertion or at rest. No excess mucus or change in color of mucus. No hemoptysis.  No chest wall deformity GI:  No heartburn, indigestion, abdominal pain, nausea, vomiting, diarrhea, change in bowel habits, loss of appetite, bloody stools.  GU: No dysuria, change in color of urine, urgency or frequency.  No flank pain, no hematuria  Skin: No rash, lesions, ulcerations MSK:  No joint pain or swelling.  No decreased range of motion.  No back pain. Neuro: No dizziness or lightheadedness.  Psych: No depression or anxiety. Mood stable.     Physical Exam:  BP 118/82    Pulse 78    Wt 188 lb (85.3 kg)    SpO2 98%    BMI 31.28 kg/m   GEN: Pleasant, interactive, well-appearing; obese; in no acute distress. HEENT:  Normocephalic and atraumatic. EACs patent bilaterally. TM pearly gray with present light reflex bilaterally. PERRLA. Sclera white. Nasal turbinates erythematous, moist and patent bilaterally.  Clear rhinorrhea present. Oropharynx erythematous and moist, without exudate or edema. No lesions, ulcerations.  Scattered white plaques on tongue NECK:  Supple w/ fair ROM. No JVD present. Normal carotid impulses w/o bruits. Thyroid symmetrical with no goiter or nodules palpated. No lymphadenopathy.   CV: RRR, no m/r/g, no peripheral edema. Pulses intact, +2 bilaterally. No cyanosis, pallor or  clubbing. PULMONARY:  Unlabored, regular breathing. Clear bilaterally A&P w/o wheezes/rales/rhonchi. No accessory muscle use. No dullness to percussion. GI: BS present and normoactive. Soft, non-tender to palpation. No organomegaly or masses detected. No CVA tenderness. MSK: No erythema, warmth or tenderness. Cap refil <2 sec all extrem. No deformities or joint swelling noted.  Neuro: A/Ox3. No focal deficits noted.   Skin: Warm, no lesions or rashe Psych: Normal affect and behavior. Judgement and thought content appropriate.     Lab Results:  CBC    Component Value Date/Time   WBC 9.0 12/16/2021 1234   RBC 4.21 12/16/2021 1234   HGB 13.3 12/16/2021 1234   HGB 13.7 12/07/2012 0000   HCT 40.0 12/16/2021 1234   PLT 315.0 12/16/2021 1234   MCV 95.1 12/16/2021 1234   MCH 30.8 12/29/2020 1419   MCHC 33.1 12/16/2021 1234   RDW 13.4 12/16/2021 1234   LYMPHSABS 2.0 12/16/2021 1234   MONOABS 0.9 12/16/2021 1234   EOSABS 0.2 12/16/2021 1234   BASOSABS 0.0 12/16/2021 1234    BMET    Component Value Date/Time   NA 136 05/29/2021 1126   NA 142 09/26/2016 0000   K 4.3 05/29/2021 1126   CL 100 05/29/2021 1126   CO2 28 05/29/2021 1126   GLUCOSE 99 05/29/2021 1126   BUN 10 05/29/2021 1126   BUN 12 09/26/2016 0000   CREATININE 0.84 05/29/2021 1126   CREATININE 0.96 04/12/2013 0000   CALCIUM 9.3 05/29/2021 1126   GFRNONAA >60 12/29/2020 1419    BNP No results found for: BNP   Imaging:  DG Chest 2 View  Result Date: 12/16/2021 CLINICAL DATA:  Shortness of breath and cough EXAM: CHEST - 2 VIEW COMPARISON:  December 12, 2014 FINDINGS: The heart size and mediastinal contours are within normal limits. Both lungs  are clear. Degenerative joint changes of bilateral shoulders and spine noted. IMPRESSION: No active cardiopulmonary disease. Electronically Signed   By: Abelardo Diesel M.D.   On: 12/16/2021 12:44      PFT Results Latest Ref Rng & Units 02/06/2016  FVC-Pre L 2.98   FVC-Predicted Pre % 90  FVC-Post L 3.04  FVC-Predicted Post % 92  Pre FEV1/FVC % % 74  Post FEV1/FCV % % 76  FEV1-Pre L 2.19  FEV1-Predicted Pre % 87  FEV1-Post L 2.32  DLCO uncorrected ml/min/mmHg 31.04  DLCO UNC% % 121  DLVA Predicted % 87    Lab Results  Component Value Date   NITRICOXIDE 7 12/01/2018        Assessment & Plan:   Asthma, intrinsic Given her previous ACT of 9 and use of albuterol, advised that she still step up therapy to Symbicort 2 puffs twice a day and use with spacer.  No evidence of bronchospasm on exam.  Overall improving since our previous visit but still having persistent cough, which could be asthmatic in nature but do suspect mostly upper airway irritation.  Continue Allegra and Singulair for trigger prevention.  Patient Instructions  -Continue Albuterol inhaler 2 puffs every 6 hours as needed for shortness of breath or wheezing. Notify if symptoms persist despite rescue inhaler/neb use. -Continue flonase nasal spray 2 sprays each nostril daily -Continue singulair 10 mg At bedtime -Continue Allegra 180 mg daily   -Start Symbicort 2 puffs Twice daily with spacer. Brush tongue and rinse mouth afterwards -Continue Astelin nasal spray 2 sprays each nostril Twice daily  -Continue nasal irrigations (netti pot) 1-2 times per day -Continue mucinex DM Twice daily as needed for cough and congestion  -Chlortab 4 mg At bedtime for cough    Asthma Action Plan in place Rinse mouth after inhaled corticosteroid use.  Avoid triggers, when able.  Exercise encouraged. Notify if worsening symptoms upon exertion.  Notify and seek help if symptoms unrelieved by rescue inhaler.   Continue CPAP 11cmH2O nightly for minimum 4-6 hours  Be aware of any drowsiness when driving and notify of excessive daytime fatigue symptoms.   CT sinus - someone will contact you for scheduling    Follow up in 3 months with Dr. Halford Mendoza or Alanson Aly. If symptoms do not improve or  worsen, please contact office for sooner follow up or seek emergency care.    Chronic rhinitis Optimized postnasal drainage control at last visit.  Still having persistent nasal drainage.  Will obtain CT sinus for further evaluation and likely referral to ENT.  No evidence of significant allergen component with normal IgE.  OSA on CPAP Tolerating CPAP well.  Average use 7 hours 36 minutes.  AHI 1.2.  Getting good benefit from use.  Upper airway cough syndrome Improved since previous visit.  Had to stop [employer].  Advised to use Chlortab at night.  Continue Mucinex DM.  Frequent sips of water and sugar-free hard candies.  Oral candidiasis Evidence of thrush on exam.  Reports that nystatin usually does not work for her.  Diflucan course sent.  Hope changed to Symbicort with spacer will help prevent further recurrence.   I spent 35 minutes of dedicated to the care of this patient on the date of this encounter to include pre-visit review of records, face-to-face time with the patient discussing conditions above, post visit ordering of testing, clinical documentation with the electronic health record, making appropriate referrals as documented, and communicating necessary findings to members of  the patients care team.  Clayton Bibles, NP 01/01/2022  Pt aware and understands NP's role.

## 2022-01-01 NOTE — Assessment & Plan Note (Addendum)
Optimized postnasal drainage control at last visit.  Still having persistent nasal drainage.  Will obtain CT sinus for further evaluation and likely referral to ENT.  No evidence of significant allergen component with normal IgE. ?

## 2022-01-01 NOTE — Assessment & Plan Note (Signed)
Improved since previous visit.  Had to stop [employer].  Advised to use Chlortab at night.  Continue Mucinex DM.  Frequent sips of water and sugar-free hard candies. ?

## 2022-01-01 NOTE — Progress Notes (Signed)
Reviewed and agree with assessment/plan. ? ? ?Chesley Mires, MD ?[place] ?01/01/2022, 2:54 PM ?Pager:  [phone] ? ?

## 2022-01-01 NOTE — Assessment & Plan Note (Signed)
Tolerating CPAP well.  Average use 7 hours 36 minutes.  AHI 1.2.  Getting good benefit from use. ?

## 2022-01-01 NOTE — Assessment & Plan Note (Signed)
Given her previous ACT of 9 and use of albuterol, advised that she still step up therapy to Symbicort 2 puffs twice a day and use with spacer.  No evidence of bronchospasm on exam.  Overall improving since our previous visit but still having persistent cough, which could be asthmatic in nature but do suspect mostly upper airway irritation.  Continue Allegra and Singulair for trigger prevention. ? ?Patient Instructions  ?-Continue Albuterol inhaler 2 puffs every 6 hours as needed for shortness of breath or wheezing. Notify if symptoms persist despite rescue inhaler/neb use. ?-Continue flonase nasal spray 2 sprays each nostril daily ?-Continue singulair 10 mg At bedtime ?-Continue Allegra 180 mg daily  ? ?-Start Symbicort 2 puffs Twice daily with spacer. Brush tongue and rinse mouth afterwards ?-Continue Astelin nasal spray 2 sprays each nostril Twice daily  ?-Continue nasal irrigations (netti pot) 1-2 times per day ?-Continue mucinex DM Twice daily as needed for cough and congestion ? ?-Chlortab 4 mg At bedtime for cough  ?  ?Asthma Action Plan in place ?Rinse mouth after inhaled corticosteroid use.  ?Avoid triggers, when able.  ?Exercise encouraged. Notify if worsening symptoms upon exertion.  ?Notify and seek help if symptoms unrelieved by rescue inhaler. ?  ?Continue CPAP 11cmH2O nightly for minimum 4-6 hours  ?Be aware of any drowsiness when driving and notify of excessive daytime fatigue symptoms.  ? ?CT sinus - someone will contact you for scheduling  ?  ?Follow up in 3 months with Dr. Halford Chessman or Alanson Aly. If symptoms do not improve or worsen, please contact office for sooner follow up or seek emergency care. ? ? ?

## 2022-01-08 ENCOUNTER — Other Ambulatory Visit: Payer: Self-pay

## 2022-01-08 ENCOUNTER — Ambulatory Visit
Admission: RE | Admit: 2022-01-08 | Discharge: 2022-01-08 | Disposition: A | Discharge status: Home / Self Care | Payer: Medicare Other | Source: Ambulatory Visit | Attending: Nurse Practitioner | Admitting: Nurse Practitioner

## 2022-01-08 DIAGNOSIS — J310 Chronic rhinitis: Secondary | ICD-10-CM | POA: Insufficient documentation

## 2022-01-08 DIAGNOSIS — J329 Chronic sinusitis, unspecified: Secondary | ICD-10-CM | POA: Diagnosis not present

## 2022-01-08 DIAGNOSIS — J341 Cyst and mucocele of nose and nasal sinus: Secondary | ICD-10-CM | POA: Diagnosis not present

## 2022-01-13 NOTE — Progress Notes (Signed)
Please notify pt her CT sinus scan did not show any evidence of chronic or acute sinusitis. Normally aerated sinuses that were patent. She can continue with our interventions as previously discussed. Thanks.

## 2022-01-16 ENCOUNTER — Ambulatory Visit: Payer: Medicare Other

## 2022-01-17 ENCOUNTER — Other Ambulatory Visit: Payer: Medicare Other

## 2022-01-21 DIAGNOSIS — C44311 Basal cell carcinoma of skin of nose: Secondary | ICD-10-CM | POA: Diagnosis not present

## 2022-01-27 DIAGNOSIS — H02889 Meibomian gland dysfunction of unspecified eye, unspecified eyelid: Secondary | ICD-10-CM | POA: Diagnosis not present

## 2022-01-27 DIAGNOSIS — H2513 Age-related nuclear cataract, bilateral: Secondary | ICD-10-CM | POA: Diagnosis not present

## 2022-02-03 ENCOUNTER — Telehealth: Payer: Self-pay | Admitting: Internal Medicine

## 2022-02-10 ENCOUNTER — Ambulatory Visit (INDEPENDENT_AMBULATORY_CARE_PROVIDER_SITE_OTHER): Payer: Medicare Other | Admitting: Pulmonary Disease

## 2022-02-10 ENCOUNTER — Encounter: Payer: Self-pay | Admitting: Pulmonary Disease

## 2022-02-10 VITALS — BP 120/62 | HR 67 | Temp 98.3°F | Ht 65.0 in | Wt 188.6 lb

## 2022-02-10 DIAGNOSIS — J45909 Unspecified asthma, uncomplicated: Secondary | ICD-10-CM

## 2022-02-10 DIAGNOSIS — J302 Other seasonal allergic rhinitis: Secondary | ICD-10-CM | POA: Diagnosis not present

## 2022-02-10 DIAGNOSIS — J019 Acute sinusitis, unspecified: Secondary | ICD-10-CM | POA: Diagnosis not present

## 2022-02-10 DIAGNOSIS — Z9989 Dependence on other enabling machines and devices: Secondary | ICD-10-CM | POA: Diagnosis not present

## 2022-02-10 DIAGNOSIS — G4733 Obstructive sleep apnea (adult) (pediatric): Secondary | ICD-10-CM

## 2022-02-10 DIAGNOSIS — J3089 Other allergic rhinitis: Secondary | ICD-10-CM

## 2022-02-10 DIAGNOSIS — R058 Other specified cough: Secondary | ICD-10-CM

## 2022-02-10 DIAGNOSIS — J310 Chronic rhinitis: Secondary | ICD-10-CM

## 2022-02-10 MED ORDER — CHLORPHENIRAMINE MALEATE 4 MG PO TABS: 4.0000 mg | ORAL_TABLET | Freq: Every day | ORAL | 3 refills | Status: DC

## 2022-02-10 MED ORDER — FLUTICASONE PROPIONATE 50 MCG/ACT NA SUSP: 1.0000 | Freq: Every day | NASAL | 3 refills | Status: DC

## 2022-02-10 MED ORDER — AZELASTINE HCL 0.1 % NA SOLN: 2.0000 | Freq: Two times a day (BID) | NASAL | 3 refills | Status: DC

## 2022-02-10 NOTE — Patient Instructions (Signed)
Will arrange for new spacer device to use with symbicort and albuterol ? ?Follow up in 6 months ?

## 2022-02-10 NOTE — Progress Notes (Signed)
? ? Pulmonary, Critical Care, and Sleep Medicine ? ?Chief Complaint  ?Patient presents with  ? Follow-up  ?  Follow up from [facility]. Pt states Angela Mendoza her on a stronger inhaler and she has developed a cough, and getting light headed now. Symbicort '180mg'$ . Patient also has questions about medications   ? ? ? ?Constitutional:  ?BP 120/62 (BP Location: Left Arm, Patient Position: Sitting, Cuff Size: Normal)   Pulse 67   Temp 98.3 ?F (36.8 ?C) (Oral)   Ht '5\' 5"'$  (1.651 m)   Wt 188 lb 9.6 oz (85.5 kg)   SpO2 97%   BMI 31.38 kg/m?  ? ?Past Medical History:  ?Vit D deficiency, Vertigo, Rosacea, Hypothyroidism, HTN, HLD, GERD, Diverticulosis, Depression, HA, COVID 12 February 2021 ? ?Past Surgical History:  ?She  has a past surgical history that includes Cholecystectomy (2007); Appendectomy (2007); Tubal ligation; Total abdominal hysterectomy (1991); Nasal sinus surgery; Breast enhancement surgery (2006); Cardiovascular stress test (2013); Colonoscopy (08/2012); Esophagogastroduodenoscopy (08/2012); US ECHOCARDIOGRAPHY (09/2009); US ECHOCARDIOGRAPHY (11/2007); sleep study (08/2007); Rectocele repair (12/2008); Colonoscopy (2006); Breast biopsy (Left, 09/27/2009); and Hernia repair. ? ?Brief Summary:  ?Angela Mendoza is a 71 y.o. female former smoker with OSA and asthma. ?  ? ? ? ?Subjective:  ? ?She saw Roxan Diesel twice since I last saw her. ? ?Got luna changed to Resmed.  She is worried luna humidifier was causing her cough to get worse. ? ?CT sinus was okay. ? ?Not having wheeze or chest congestion.  Still gets post nasal drip.  Using azelastine bid and flonase in the morning.  Chlorpheniramine helps.  Uses mucinex dm prn.  Tessalon caused upset stomach but helped with cough. ? ?Physical Exam:  ? ?Appearance - well kempt  ? ?ENMT - no sinus tenderness, no oral exudate, no LAN, Mallampati 2 airway, no stridor ? ?Respiratory - equal breath sounds bilaterally, no wheezing or rales ? ?CV - s1s2 regular rate and rhythm, no  murmurs ? ?Ext - no clubbing, no edema ? ?Skin - no rashes ? ?Psych - normal mood and affect ? ? ?  ?Pulmonary testing:  ?Spirometry 12/12 >> FEV1% 68 ?RAST 11/15/14 >> IgE 30, cats/mold ?PFT 02/07/16 >> FEV1 2.32 (92%), FEV1% 76, FEF 25-75% 1.56 (71%), TLC 4.25 (81%), DLCO 121, borderline BD from FEF 25-75 ?FeNO 12/01/18 >> 7 ?IgE 12/16/21 >> 23 ? ?Chest Imaging:  ?Coronary CT 04/04/21 >> stable 2 mm nodule LLL, coronary calcium score 0 ?CT sinus 01/09/22 >> normal ? ?Sleep Tests:  ?PSG 09/06/07 >> AHI 19 ?CPAP 12/05/20 to 01/03/21 >> used on 30 of 30 nights with average 8 hrs 15 min.  Average AHI 0.6 with CPAP 11 cm H2O ? ?Cardiac Tests:  ?Echo 01/23/21 >> EF 60 to 65%, grade 2 DD, RVSP 39.1 mmHg, mild MR ? ?Social History:  ?She  reports that she quit smoking about 44 years ago. Her smoking use included cigarettes. She has a 2.10 pack-year smoking history. She has never used smokeless tobacco. She reports that she does not currently use alcohol. She reports that she does not use drugs. ? ?Family History:  ?Her family history includes AAA (abdominal aortic aneurysm) in her paternal uncle; Aortic dissection in her maternal aunt and paternal uncle; Breast cancer in her maternal aunt; Cancer in her cousin and paternal aunt; Cancer (age of onset: 46) in her father; Cancer (age of onset: 4) in her mother; Heart attack in her paternal uncle; Heart disease in her paternal uncle; Heart failure in her  sister; Hypertension in her sister; Stroke in her paternal uncle and sister. ?  ? ? ?Assessment/Plan:  ? ?Allergic asthma. ?- she gets scripts through [facility] ?- will arrange for spacer device ?- continue symbicort 160 two puffs bid and singulair ?- prn albuterol ?- she is going to enroll in asthma study at [facility] ? ?Recurrent thrush. ?- doing better since switching to symbicort ?- nystatin doesn't clear her infection; usually needs diflucan ?  ?Upper airway cough with allergic rhinitis. ?- dysmista caused funny taste ?- azelastine  two sprays bid, and flonase 1 spray in the morning ?- allegra in the morning and chlorpheniramine at night ?- continue singulair ?- prn mucinex, mucinex dm, tessalon ?- continue nasal irrigation ?  ?Obstructive sleep apnea. ?- she is compliant with CPAP and reports benefit from therapy ?- uses [employer] for her DME ?- continue CPAP 10 cm H2O ? ?Time Spent Involved in Patient Care on Day of Examination:  ?36 minutes ? ?Follow up:  ? ?Patient Instructions  ?Will arrange for new spacer device to use with symbicort and albuterol ? ?Follow up in 6 months ? ?Medication List:  ? ?Allergies as of 02/10/2022   ? ?   Reactions  ? Codeine Other (See Comments)  ? Low blood pressure/nausea  ? Sulfonamide Derivatives Rash  ? rash  ? ?  ? ?  ?Medication List  ?  ? ?  ? Accurate as of February 10, 2022  2:11 PM. If you have any questions, ask your nurse or doctor.  ?  ?  ? ?  ? ?STOP taking these medications   ? ?beclomethasone 80 MCG/ACT inhaler ?Commonly known as: QVAR ?Stopped by: Chesley Mires, MD ?  ?fluconazole 100 MG tablet ?Commonly known as: Diflucan ?Stopped by: Chesley Mires, MD ?  ?Spacer/Aero-Hold [employer] Misc ?Stopped by: Chesley Mires, MD ?  ?Spacer/Aero-Holding Dorise Bullion ?Stopped by: Chesley Mires, MD ?  ? ?  ? ?TAKE these medications   ? ?albuterol 108 (90 Base) MCG/ACT inhaler ?Commonly known as: Proventil HFA ?Inhale 2 puffs into the lungs every 6 (six) hours as needed. ?  ?ALPRAZolam 0.25 MG tablet ?Commonly known as: Duanne Moron ?TAKE 1 TABLET (0.25 MG TOTAL) BY MOUTH ONCE DAILY AS NEEDED FOR SLEEP. ?  ?atorvastatin 40 MG tablet ?Commonly known as: LIPITOR ?Take 1 tablet (40 mg total) by mouth daily. ?  ?azelastine 0.1 % nasal spray ?Commonly known as: ASTELIN ?Place 2 sprays into both nostrils 2 (two) times daily. Use in each nostril as directed ?  ?benzonatate 200 MG capsule ?Commonly known as: TESSALON ?Take 1 capsule (200 mg total) by mouth 3 (three) times daily as needed for cough. ?  ?budesonide-formoterol 160-4.5  MCG/ACT inhaler ?Commonly known as: Symbicort ?Inhale 2 puffs into the lungs in the morning and at bedtime. ?  ?chlorpheniramine 4 MG tablet ?Commonly known as: CHLOR-TRIMETON ?Take 1 tablet (4 mg total) by mouth at bedtime. ?Started by: Chesley Mires, MD ?  ?conjugated estrogens 0.625 MG/GM vaginal cream ?Commonly known as: PREMARIN ?Place 1 Applicatorful vaginally 2 (two) times a week. 90 day supply,  Refill for one year ?  ?COQ10 PO ?Take by mouth daily. ?  ?cyanocobalamin 1000 MCG/ML injection ?Commonly known as: (VITAMIN B-12) ?Inject 1 mL (1,000 mcg total) into the muscle every 30 (thirty) days. ?  ?cycloSPORINE 0.05 % ophthalmic emulsion ?Commonly known as: RESTASIS ?Place 1 drop into both eyes 2 (two) times daily. ?  ?dextromethorphan-guaiFENesin 30-600 MG 12hr tablet ?Commonly known as: [place] DM ?  Take 1 tablet by mouth 2 (two) times daily as needed for cough. ?  ?diazepam 5 MG tablet ?Commonly known as: VALIUM ?Take 1 tablet (5 mg total) by mouth daily as needed for anxiety. ?  ?EPINEPHrine 0.3 mg/0.3 mL Soaj injection ?Commonly known as: EPI-PEN ?EpiPen 2-Pak 0.3 mg/0.3 mL injection, auto-injector ?  0.3 mg by injection route. ?  ?estradiol 0.1 MG/24HR patch ?Commonly known as: VIVELLE-DOT ?Place 1 patch (0.1 mg total) onto the skin 2 (two) times a week. ?  ?famotidine 20 MG tablet ?Commonly known as: PEPCID ?Take 1 tablet (20 mg total) by mouth daily as needed for heartburn or indigestion. ?  ?fexofenadine 180 MG tablet ?Commonly known as: Allegra Allergy ?Take 1 tablet (180 mg total) by mouth daily. ?  ?FLUoxetine 10 MG tablet ?Commonly known as: PROZAC ?TAKE ONE TABLET BY MOUTH EVERY DAY ?  ?fluticasone 50 MCG/ACT nasal spray ?Commonly known as: FLONASE ?Place 1 spray into both nostrils daily. ?What changed: how much to take ?Changed by: Chesley Mires, MD ?  ?levothyroxine 50 MCG tablet ?Commonly known as: Synthroid ?Take 1 tablet (50 mcg total) by mouth daily before breakfast. ?  ?losartan 50 MG  tablet ?Commonly known as: COZAAR ?Take 1 tablet (50 mg total) by mouth at bedtime. ?  ?MAGNESIUM PO ?Take 250 mg by mouth daily. ?  ?meclizine 25 MG tablet ?Commonly known as: ANTIVERT ?Take 1 tablet (25 mg total) by

## 2022-02-11 DIAGNOSIS — M2242 Chondromalacia patellae, left knee: Secondary | ICD-10-CM | POA: Diagnosis not present

## 2022-02-17 MED ORDER — FLUOXETINE HCL 10 MG PO TABS: 10.0000 mg | ORAL_TABLET | Freq: Every day | ORAL | 1 refills | Status: DC

## 2022-02-17 NOTE — Telephone Encounter (Signed)
Pt called in requesting refill on medication (FLUoxetine (PROZAC) 10 MG tablet)... Pt stating that script needs to go to [place] by mail... Pt requesting 90 day supply... Pt requesting callback  ?

## 2022-02-17 NOTE — Telephone Encounter (Signed)
Medication has been refilled.

## 2022-02-17 NOTE — Addendum Note (Signed)
Addended by: Adair Laundry on: 02/17/2022 12:09 PM ? ? Modules accepted: Orders ? ?

## 2022-02-27 ENCOUNTER — Encounter: Payer: Self-pay | Admitting: Internal Medicine

## 2022-02-27 ENCOUNTER — Ambulatory Visit (INDEPENDENT_AMBULATORY_CARE_PROVIDER_SITE_OTHER): Payer: Medicare Other | Admitting: Internal Medicine

## 2022-02-27 VITALS — BP 130/84 | HR 64 | Temp 97.9°F | Ht 65.0 in | Wt 190.8 lb

## 2022-02-27 DIAGNOSIS — N3000 Acute cystitis without hematuria: Secondary | ICD-10-CM | POA: Diagnosis not present

## 2022-02-27 DIAGNOSIS — K5900 Constipation, unspecified: Secondary | ICD-10-CM | POA: Diagnosis not present

## 2022-02-27 DIAGNOSIS — N952 Postmenopausal atrophic vaginitis: Secondary | ICD-10-CM

## 2022-02-27 DIAGNOSIS — C44311 Basal cell carcinoma of skin of nose: Secondary | ICD-10-CM | POA: Diagnosis not present

## 2022-02-27 DIAGNOSIS — D699 Hemorrhagic condition, unspecified: Secondary | ICD-10-CM

## 2022-02-27 DIAGNOSIS — R300 Dysuria: Secondary | ICD-10-CM | POA: Diagnosis not present

## 2022-02-27 DIAGNOSIS — N8110 Cystocele, unspecified: Secondary | ICD-10-CM

## 2022-02-27 DIAGNOSIS — T148XXA Other injury of unspecified body region, initial encounter: Secondary | ICD-10-CM

## 2022-02-27 LAB — POCT URINALYSIS DIPSTICK
Bilirubin, UA: NEGATIVE
Blood, UA: POSITIVE
Glucose, UA: NEGATIVE
Ketones, UA: NEGATIVE
Leukocytes, UA: NEGATIVE
Nitrite, UA: NEGATIVE
Protein, UA: NEGATIVE
Spec Grav, UA: 1.015 (ref 1.010–1.025)
Urobilinogen, UA: 0.2 E.U./dL
pH, UA: 6 (ref 5.0–8.0)

## 2022-02-27 LAB — COMPREHENSIVE METABOLIC PANEL
ALT: 30 U/L (ref 0–35)
AST: 27 U/L (ref 0–37)
Albumin: 4.5 g/dL (ref 3.5–5.2)
Alkaline Phosphatase: 73 U/L (ref 39–117)
BUN: 12 mg/dL (ref 6–23)
CO2: 29 mEq/L (ref 19–32)
Calcium: 9.1 mg/dL (ref 8.4–10.5)
Chloride: 100 mEq/L (ref 96–112)
Creatinine, Ser: 0.9 mg/dL (ref 0.40–1.20)
GFR: 64.42 mL/min (ref >60.00)
Glucose, Bld: 94 mg/dL (ref 70–99)
Potassium: 4.3 mEq/L (ref 3.5–5.1)
Sodium: 136 mEq/L (ref 135–145)
Total Bilirubin: 1 mg/dL (ref 0.2–1.2)
Total Protein: 7.1 g/dL (ref 6.0–8.3)

## 2022-02-27 LAB — URINALYSIS, MICROSCOPIC ONLY

## 2022-02-27 LAB — CBC WITH DIFFERENTIAL/PLATELET
Basophils Absolute: 0 10*3/uL (ref 0.0–0.1)
Basophils Relative: 0.4 % (ref 0.0–3.0)
Eosinophils Absolute: 0.1 10*3/uL (ref 0.0–0.7)
Eosinophils Relative: 1 % (ref 0.0–5.0)
HCT: 39 % (ref 36.0–46.0)
Hemoglobin: 12.8 g/dL (ref 12.0–15.0)
Lymphocytes Relative: 28.5 % (ref 12.0–46.0)
Lymphs Abs: 2 10*3/uL (ref 0.7–4.0)
MCHC: 32.8 g/dL (ref 30.0–36.0)
MCV: 95.4 fl (ref 78.0–100.0)
Monocytes Absolute: 0.7 10*3/uL (ref 0.1–1.0)
Monocytes Relative: 9.6 % (ref 3.0–12.0)
Neutro Abs: 4.2 10*3/uL (ref 1.4–7.7)
Neutrophils Relative %: 60.5 % (ref 43.0–77.0)
Platelets: 305 10*3/uL (ref 150.0–400.0)
RBC: 4.09 Mil/uL (ref 3.87–5.11)
RDW: 13.5 % (ref 11.5–15.5)
WBC: 7 10*3/uL (ref 4.0–10.5)

## 2022-02-27 LAB — PROTIME-INR
INR: 0.9 ratio (ref 0.8–1.0)
Prothrombin Time: 10.2 s (ref 9.6–13.1)

## 2022-02-27 LAB — APTT: aPTT: 33.6 s — ABNORMAL HIGH (ref 23.4–32.7)

## 2022-02-27 MED ORDER — PREMARIN 0.625 MG/GM VA CREA: 1.0000 g | TOPICAL_CREAM | Freq: Every day | VAGINAL | 12 refills | Status: DC

## 2022-02-27 NOTE — Assessment & Plan Note (Signed)
Referring (again) to Dr Sharlett Iles for definitive treatment.  ?

## 2022-02-27 NOTE — Assessment & Plan Note (Signed)
She has purpura on both arms.  Last episode of epistaxis in 2022 occurred because of use of fish oil and asa.  Checking coags and platelets today ?

## 2022-02-27 NOTE — Assessment & Plan Note (Signed)
She has a small external hemorrhoid on exam.  Recommend trial of glycerin suppositories. ?

## 2022-02-27 NOTE — Assessment & Plan Note (Signed)
S/p second Moh's procedure ., right ala nasi recently with excellent cosmetic results ?

## 2022-02-27 NOTE — Patient Instructions (Addendum)
There's no evidence of infection on your STAT urinalysis,  but the results will not be complete for 48 hours  ? ?Try using glycerin suppository before each BM to help pass your stool ? ?Get back on estrogen cream:  use it nightly for 2 weeks before reducing to twice weekly ? ?Avoid deodorant panty liners and deodorant soaps ? ?I will make the referral back to dr Sharlett Iles at [facility] ? ? ?I am checking your coags and platelet count today because of the bruising  ? ?

## 2022-02-27 NOTE — Progress Notes (Addendum)
Subjective:  Patient ID: Angela Mendoza, female    DOB: 01-Mar-1951  Age: 71 y.o. MRN: [id number]  CC: The primary encounter diagnosis was Dysuria. Diagnoses of Bruising, Basal cell carcinoma (BCC) of ala nasi, Bleeding diathesis (HCC), Atrophic vaginitis, Cystocele without uterine prolapse, Constipation, unspecified constipation type, and Acute cystitis without hematuria were also pertinent to this visit.   This visit occurred during the SARS-CoV-2 public health emergency.  Safety protocols were in place, including screening questions prior to the visit, additional usage of staff PPE, and extensive cleaning of exam room while observing appropriate contact time as indicated for disinfecting solutions.    HPI Angela Mendoza presents for urgent evaluation of urinary symptoms that started last evening  Chief Complaint  Patient presents with   Urinary Tract Infection    Urinary pressure and burning, trouble emptying bladder     71 yr old female with history of atrophic vaginitis and cystocele, no prior surgery presents with incomplete emptying of  bladder  that is chronic but progressive,  and new onset pressure and burning in the vaginal area that start last evening, without hematuria, flank pain nausea . [name] Has developed stress incontinence due to cystocele,  wears panty liners.   Last urogyn evaluation was in 2016 by Gracy Bruins.  Did not follow up for entry into RCT . Has not been using Premarin cream for the last several motnhs  2) had Moh's procedure on right alar by  Dr Adriana Simas recently. She is healing well   3) has begun bruising again on forearms. Denies hematuria, gums bleeding    4) trouble with bowel movements.  Has difficulty finishing bowel movements. Has to wipe frequently because the last bit remains clinging to her anus.   Outpatient Medications Prior to Visit  Medication Sig Dispense Refill   albuterol (PROVENTIL HFA) 108 (90 Base) MCG/ACT inhaler Inhale 2 puffs into the  lungs every 6 (six) hours as needed. 18 g 2   ALPRAZolam (XANAX) 0.25 MG tablet TAKE 1 TABLET (0.25 MG TOTAL) BY MOUTH ONCE DAILY AS NEEDED FOR SLEEP. 30 tablet 5   atorvastatin (LIPITOR) 40 MG tablet Take 1 tablet (40 mg total) by mouth daily. 90 tablet 3   azelastine (ASTELIN) 0.1 % nasal spray Place 2 sprays into both nostrils 2 (two) times daily. Use in each nostril as directed 90 mL 3   benzonatate (TESSALON) 200 MG capsule Take 1 capsule (200 mg total) by mouth 3 (three) times daily as needed for cough. 30 capsule 1   budesonide-formoterol (SYMBICORT) 160-4.5 MCG/ACT inhaler Inhale 2 puffs into the lungs in the morning and at bedtime. 1 each 0   chlorpheniramine (CHLOR-TRIMETON) 4 MG tablet Take 1 tablet (4 mg total) by mouth at bedtime. 90 tablet 3   Cholecalciferol (VITAMIN D3) 50 MCG (2000 UT) TABS Take 1 tablet by mouth daily. 90 tablet 3   Coenzyme Q10 (COQ10 PO) Take by mouth daily.     cyanocobalamin (,VITAMIN B-12,) 1000 MCG/ML injection Inject 1 mL (1,000 mcg total) into the muscle every 30 (thirty) days. 3 mL 3   cycloSPORINE (RESTASIS) 0.05 % ophthalmic emulsion Place 1 drop into both eyes 2 (two) times daily. 3 each 3   dextromethorphan-guaiFENesin (MUCINEX DM) 30-600 MG 12hr tablet Take 1 tablet by mouth 2 (two) times daily as needed for cough. 60 tablet 0   diazepam (VALIUM) 5 MG tablet Take 1 tablet (5 mg total) by mouth daily as needed for anxiety. 30 tablet 2  Docusate Calcium (STOOL SOFTENER PO) Take 1 tablet by mouth as needed.     EPINEPHrine 0.3 mg/0.3 mL IJ SOAJ injection EpiPen 2-Pak 0.3 mg/0.3 mL injection, auto-injector   0.3 mg by injection route.     estradiol (VIVELLE-DOT) 0.1 MG/24HR patch Place 1 patch (0.1 mg total) onto the skin 2 (two) times a week. 24 patch 3   famotidine (PEPCID) 20 MG tablet Take 1 tablet (20 mg total) by mouth daily as needed for heartburn or indigestion. 90 tablet 3   fexofenadine (ALLEGRA ALLERGY) 180 MG tablet Take 1 tablet (180 mg  total) by mouth daily. 90 tablet 3   FLUoxetine (PROZAC) 10 MG tablet Take 1 tablet (10 mg total) by mouth daily. 90 tablet 1   fluticasone (FLONASE) 50 MCG/ACT nasal spray Place 1 spray into both nostrils daily. 48 g 3   levothyroxine (SYNTHROID) 50 MCG tablet Take 1 tablet (50 mcg total) by mouth daily before breakfast. 90 tablet 3   losartan (COZAAR) 50 MG tablet Take 1 tablet (50 mg total) by mouth at bedtime. 90 tablet 3   MAGNESIUM PO Take 250 mg by mouth daily.     meclizine (ANTIVERT) 25 MG tablet Take 1 tablet (25 mg total) by mouth 3 (three) times daily as needed. 270 tablet 3   metoprolol succinate (TOPROL-XL) 50 MG 24 hr tablet Take 1 tablet (50 mg total) by mouth daily. 90 tablet 3   montelukast (SINGULAIR) 10 MG tablet Take 1 tablet (10 mg total) by mouth at bedtime. 90 tablet 3   Multiple Vitamin (MULTIVITAMIN) tablet Take 1 tablet by mouth daily.     mupirocin ointment (BACTROBAN) 2 % Apply topically.     ondansetron (ZOFRAN ODT) 4 MG disintegrating tablet Take 1 tablet (4 mg total) by mouth every 8 (eight) hours as needed for nausea or vomiting. 30 tablet 2   Propylene Glycol 0.6 % SOLN Apply 1 drop to eye as needed.     psyllium (METAMUCIL) 58.6 % powder Take 1 packet by mouth as needed.     Syringe/Needle, Disp, (SYRINGE 3CC/20GX1") 20G X 1" 3 ML MISC 1 Syringe by Does not apply route every 30 (thirty) days. For use with b-12 injection monthly 50 each 0   tretinoin (RETIN-A) 0.05 % cream APPLY TOPICALLY AT BEDTIME 45 g 1   urea (CARMOL) 10 % cream Apply topically as needed. 71 g 3   conjugated estrogens (PREMARIN) vaginal cream Place 1 Applicatorful vaginally 2 (two) times a week. 90 day supply,  Refill for one year 90 g 3   No facility-administered medications prior to visit.    Review of Systems;  Patient denies headache, fevers, malaise, unintentional weight loss, skin rash, eye pain, sinus congestion and sinus pain, sore throat, dysphagia,  hemoptysis , cough, dyspnea,  wheezing, chest pain, palpitations, orthopnea, edema, abdominal pain, nausea, melena, diarrhea, constipation, flank pain, dysuria, hematuria, urinary  Frequency, nocturia, numbness, tingling, seizures,  Focal weakness, Loss of consciousness,  Tremor, insomnia, depression, anxiety, and suicidal ideation.      Objective:  BP 130/84 (BP Location: Left Arm, Patient Position: Sitting, Cuff Size: Large)   Pulse 64   Temp 97.9 F (36.6 C) (Oral)   Ht 5\' 5"  (1.651 m)   Wt 190 lb 12.8 oz (86.5 kg)   SpO2 95%   BMI 31.75 kg/m    General Appearance:    Alert, cooperative, no distress, appears stated age  Head:    Normocephalic, without obvious abnormality, atraumatic  Eyes:  PERRL, conjunctiva/corneas clear, EOM's intact, fundi    benign, both eyes  Ears:    Normal TM's and external ear canals, both ears  Nose:   Nares normal, septum midline, mucosa normal, no drainage    or sinus tenderness  Throat:   Lips, mucosa, and tongue normal; teeth and gums normal  Neck:   Supple, symmetrical, trachea midline, no adenopathy;    thyroid:  no enlargement/tenderness/nodules; no carotid   bruit or JVD  Back:     Symmetric, no curvature, ROM normal, no CVA tenderness  Lungs:     Clear to auscultation bilaterally, respirations unlabored  Chest Wall:    No tenderness or deformity   Heart:    Regular rate and rhythm, S1 and S2 normal, no murmur, rub   or gallop  Breast Exam:    Deferred  Abdomen:     Soft, non-tender, bowel sounds active all four quadrants,    no masses, no organomegaly  Genitalia:    Pelvic: cervix and uterus surgically absent, , external genitalia normal, cystocele present with cough .  ATROPHIC changes to labia minora and vaginal walls.  Not discharge  Extremities:   Extremities normal, atraumatic, no cynosis or edema  Pulses:   2+ and symmetric all extremities  Skin:   Bilateral purpura on forearms   Lymph nodes:   Cervical, supraclavicular, and axillary nodes normal  Neurologic:    CNII-XII intact, normal strength, sensation and reflexes    throughout    BP Readings from Last 3 Encounters:  02/27/22 130/84  02/10/22 120/62  01/01/22 118/82    Wt Readings from Last 3 Encounters:  02/27/22 190 lb 12.8 oz (86.5 kg)  02/10/22 188 lb 9.6 oz (85.5 kg)  01/01/22 188 lb (85.3 kg)    Lab Results  Component Value Date   HGBA1C 5.9 10/12/2015    Lab Results  Component Value Date   CREATININE 0.90 02/27/2022   CREATININE 0.84 05/29/2021   CREATININE 0.70 04/04/2021    Lab Results  Component Value Date   WBC 7.0 02/27/2022   HGB 12.8 02/27/2022   HCT 39.0 02/27/2022   PLT 305.0 02/27/2022   GLUCOSE 94 02/27/2022   CHOL 148 05/29/2021   TRIG 127.0 05/29/2021   HDL 51.80 05/29/2021   LDLDIRECT 83.0 05/28/2020   LDLCALC 71 05/29/2021   ALT 30 02/27/2022   AST 27 02/27/2022   NA 136 02/27/2022   K 4.3 02/27/2022   CL 100 02/27/2022   CREATININE 0.90 02/27/2022   BUN 12 02/27/2022   CO2 29 02/27/2022   TSH 1.99 05/29/2021   INR 0.9 02/27/2022   HGBA1C 5.9 10/12/2015    CT MAXILLOFACIAL WO CONTRAST  Result Date: 01/09/2022 CLINICAL DATA:  Chronic sinusitis EXAM: CT MAXILLOFACIAL WITHOUT CONTRAST TECHNIQUE: Multidetector CT images of the paranasal sinuses were obtained using the standard protocol without intravenous contrast. RADIATION DOSE REDUCTION: This exam was performed according to the departmental dose-optimization program which includes automated exposure control, adjustment of the mA and/or kV according to patient size and/or use of iterative reconstruction technique. COMPARISON:  No prior facial bone or sinus CT, correlation is made with CT head 12/29/2020 FINDINGS: Paranasal sinuses: Frontal: Normally aerated. Patent frontal sinus drainage pathways. Ethmoid: Normally aerated. Maxillary: Small mucous retention cyst in the inferior left maxillary sinus. Otherwise normally aerated. No osseous thickening or erosion. Sphenoid: Normally aerated. Patent  sphenoethmoidal recesses. Right ostiomeatal unit: Patent. Left ostiomeatal unit: Patent. Nasal passages: Patent. Bilateral concha bullosa. Intact nasal septum is  midline. Anatomy: No pneumatization superior to anterior ethmoid notches. Symmetric and intact olfactory grooves and fovea ethmoidalis, Keros I (1-68mm). Sellar sphenoid pneumatization pattern. Other: Orbits and intracranial compartment are unremarkable. Visible mastoid air cells are normally aerated. IMPRESSION: Normally aerated paranasal sinuses. Patent sinus drainage pathways. No evidence of acute or chronic sinusitis. Electronically Signed   By: Wiliam Ke M.D.   On: 01/09/2022 03:12    Assessment & Plan:   Problem List Items Addressed This Visit     UTI (urinary tract infection)    Treating for Proteus mirabilis sensitive to cipro        Fecal retention    She has a small external hemorrhoid on exam.  Recommend trial of glycerin suppositories.       Cystocele without uterine prolapse    Referring (again) to Dr Doy Hutching for definitive treatment.        Relevant Orders   Ambulatory referral to Urogynecology   Bleeding diathesis (HCC)    She has purpura on both arms.  Last episode of epistaxis in 2022 occurred because of use of fish oil and asa.  Checking coags and platelets today       Basal cell carcinoma (BCC) of ala nasi    S/p second Moh's procedure ., right ala nasi recently with excellent cosmetic results       Relevant Medications   ciprofloxacin (CIPRO) 250 MG tablet   Atrophic vaginitis    Advised to resume vaginal estrogen.        Other Visit Diagnoses     Dysuria    -  Primary   Relevant Orders   POCT Urinalysis Dipstick (Completed)   Urine Culture (Completed)   Urine Microscopic Only (Completed)   Bruising       Relevant Orders   Protime-INR (Completed)   PTT (Completed)   CBC with Differential/Platelet (Completed)   Comprehensive metabolic panel (Completed)       I spent a total of   42 minutes with this patient in a face to face visit on the date of this encounter reviewing  her current urinalysis,  her surgical history of hysterectomy,  her last visit with Dr Doy Hutching at [facility] in 2016,  her  recent skin cancer biosy and Mohs procedure by Dr Adriana Simas , her last workup for coagulopathy,  her discontination of estrogen cream and post visit ordering of testing and therapeutics.    Follow-up: No follow-ups on file.   Sherlene Shams, MD

## 2022-02-27 NOTE — Assessment & Plan Note (Addendum)
Advised to resume vaginal estrogen.  ?

## 2022-03-01 LAB — URINE CULTURE
MICRO NUMBER:: 13352128
SPECIMEN QUALITY:: ADEQUATE

## 2022-03-01 MED ORDER — CIPROFLOXACIN HCL 250 MG PO TABS: 250.0000 mg | ORAL_TABLET | Freq: Two times a day (BID) | ORAL | 0 refills | Status: AC

## 2022-03-01 NOTE — Assessment & Plan Note (Signed)
Treating for Proteus mirabilis sensitive to cipro  ?

## 2022-03-01 NOTE — Addendum Note (Signed)
Addended by: Crecencio Mc on: 03/01/2022 10:33 PM ? ? Modules accepted: Orders ? ?

## 2022-03-02 ENCOUNTER — Encounter: Payer: Self-pay | Admitting: Internal Medicine

## 2022-03-03 ENCOUNTER — Other Ambulatory Visit: Payer: Self-pay

## 2022-03-03 MED ORDER — PREMARIN 0.625 MG/GM VA CREA: 1.0000 g | TOPICAL_CREAM | Freq: Every day | VAGINAL | 12 refills | Status: DC

## 2022-03-12 ENCOUNTER — Other Ambulatory Visit: Payer: Self-pay | Admitting: Internal Medicine

## 2022-03-21 ENCOUNTER — Ambulatory Visit (INDEPENDENT_AMBULATORY_CARE_PROVIDER_SITE_OTHER): Payer: Medicare Other | Admitting: Family

## 2022-03-21 ENCOUNTER — Other Ambulatory Visit: Payer: Self-pay

## 2022-03-21 ENCOUNTER — Encounter: Payer: Self-pay | Admitting: Family

## 2022-03-21 VITALS — BP 128/74 | HR 61 | Temp 98.1°F | Ht 65.0 in | Wt 191.2 lb

## 2022-03-21 DIAGNOSIS — J029 Acute pharyngitis, unspecified: Secondary | ICD-10-CM | POA: Diagnosis not present

## 2022-03-21 LAB — POCT RAPID STREP A (OFFICE)
Rapid Strep A Screen: NEGATIVE
Rapid Strep A Screen: NEGATIVE

## 2022-03-21 LAB — POC COVID19 BINAXNOW: SARS Coronavirus 2 Ag: NEGATIVE

## 2022-03-21 MED ORDER — PREDNISONE 10 MG PO TABS: ORAL_TABLET | ORAL | 0 refills | Status: DC

## 2022-03-21 NOTE — Progress Notes (Signed)
Subjective:    Patient ID: Angela Mendoza, female    DOB: [birth year], 71 y.o.   MRN: [id number]  CC: Angela Mendoza is a 71 y.o. female who presents today for an acute visit.    HPI: Complains of sore throat and ear pain x 2 days. Sore throat has improved.   H/o allergies. Ears are popping. Endorses PND.  Chronic cough, however past 2 days more congestion. No fever, HA, facial pain, SOB, wheezing.   History of tonsillectomy  compliant with singulair, azelastine, allegra and flonase.  She has chronic cough since covid. She has h/o asthma. She has started symbicort. No longer on qvar.  She follows with Dr Halford Chessman, last seen 02/10/22  HISTORY:  Past Medical History:  Diagnosis Date   Agatston coronary artery calcium score less than 100    a. 06/2018 Cardiac CT: Ca2+ = 3 (49th %'ile).   Allergic rhinitis 06/09/2008   chronic   Aortic atherosclerosis ([place])    a. 06/2018 noted on chest CT.   Asthma, intrinsic 10/06/2011   controlled on qvar. [name] 09/2011:  Very mild airflow obstruction (FEV1% 68, FVL c/w obstruction)    Basal cell carcinoma 06/10/2021   right prox nasal ala rim - MOHs 07/12/21 Dr. Lacinda Axon   Chronic headache 06/09/2008   COPD (chronic obstructive pulmonary disease) ([place])    Depression    Diverticulosis 2013   h/o diverticulitis   Dry eyes    GERD (gastroesophageal reflux disease)    History of breast implant removal    silicone mastitis - R axilla silicone LN, free silicone L breast upper outer quadrant   Hot flashes    HYPERLIPIDEMIA 06/09/2008   HYPERTENSION 06/09/2008   Hypothyroidism    MVP (mitral valve prolapse)    OSA on CPAP    [name]   PAF (paroxysmal atrial fibrillation) ([place]) 06/09/2008   a. CHA2DS2VASc = 3-->prefers ASA.   Pernicious anemia    per prior pcp   Rosacea    Surgical menopause 1991   Vertigo    Vitamin D deficiency    Past Surgical History:  Procedure Laterality Date   APPENDECTOMY  2007   BREAST BIOPSY Left 09/27/2009   BREAST  ENHANCEMENT SURGERY  2006   CARDIOVASCULAR STRESS TEST  2013   [place] [name] - WNL, EF 55-60%, with 0 calcium score   CHOLECYSTECTOMY  2007   biliary dyskinesia   COLONOSCOPY  08/2012   diverticulosis   COLONOSCOPY  2006   inflamed hyperplastic polyp   ESOPHAGOGASTRODUODENOSCOPY  08/2012   esophageal reflux   HERNIA REPAIR     NASAL SINUS SURGERY     RECTOCELE REPAIR  12/2008   [name]   sleep study  08/2007   OSA, 12 cm H2O,   TOTAL ABDOMINAL HYSTERECTOMY  1991   heavy bleeding, ovaries removed   TUBAL LIGATION     US ECHOCARDIOGRAPHY  09/2009   impaired relaxation, mild tric regurg, normal MV, EF 60%   US ECHOCARDIOGRAPHY  11/2007   mild-mod MR   Family History  Problem Relation Age of Onset   Cancer Mother 69       lung, passive smoke   Cancer Father 39       lung, smoker   Cancer Paternal Aunt        breast, lung   Cancer Cousin        breast   Stroke Sister        TIA   Hypertension Sister  Heart failure Sister    Aortic dissection Paternal Uncle    Heart attack Paternal Uncle    AAA (abdominal aortic aneurysm) Paternal Uncle    Aortic dissection Maternal Aunt    Breast cancer Maternal Aunt    Heart disease Paternal Uncle    Stroke Paternal Uncle     Allergies: Codeine and Sulfonamide derivatives Current Outpatient Medications on File Prior to Visit  Medication Sig Dispense Refill   albuterol (PROVENTIL HFA) 108 (90 Base) MCG/ACT inhaler Inhale 2 puffs into the lungs every 6 (six) hours as needed. 18 g 2   ALPRAZolam (XANAX) 0.25 MG tablet TAKE 1 TABLET (0.25 MG TOTAL) BY MOUTH ONCE DAILY AS NEEDED FOR SLEEP. 30 tablet 5   atorvastatin (LIPITOR) 40 MG tablet Take 1 tablet (40 mg total) by mouth daily. 90 tablet 3   azelastine (ASTELIN) 0.1 % nasal spray Place 2 sprays into both nostrils 2 (two) times daily. Use in each nostril as directed 90 mL 3   budesonide-formoterol (SYMBICORT) 160-4.5 MCG/ACT inhaler Inhale 2 puffs into the lungs in the  morning and at bedtime. 1 each 0   chlorpheniramine (CHLOR-TRIMETON) 4 MG tablet Take 1 tablet (4 mg total) by mouth at bedtime. 90 tablet 3   Cholecalciferol (VITAMIN D3) 50 MCG (2000 UT) TABS Take 1 tablet by mouth daily. 90 tablet 3   Coenzyme Q10 (COQ10 PO) Take by mouth daily.     conjugated estrogens (PREMARIN) vaginal cream Place vaginally 2 (two) times a week. INSERT 0.5 APPLICATORS FULL VAGINALLY TWICE PER WEEK. AT BEDTIME. 30 g 4   cyanocobalamin (,VITAMIN B-12,) 1000 MCG/ML injection Inject 1 mL (1,000 mcg total) into the muscle every 30 (thirty) days. 3 mL 3   cycloSPORINE (RESTASIS) 0.05 % ophthalmic emulsion Place 1 drop into both eyes 2 (two) times daily. 3 each 3   dextromethorphan-guaiFENesin (MUCINEX DM) 30-600 MG 12hr tablet Take 1 tablet by mouth 2 (two) times daily as needed for cough. 60 tablet 0   diazepam (VALIUM) 5 MG tablet Take 1 tablet (5 mg total) by mouth daily as needed for anxiety. 30 tablet 2   Docusate Calcium (STOOL SOFTENER PO) Take 1 tablet by mouth as needed.     EPINEPHrine 0.3 mg/0.3 mL IJ SOAJ injection EpiPen 2-Pak 0.3 mg/0.3 mL injection, auto-injector   0.3 mg by injection route.     estradiol (VIVELLE-DOT) 0.1 MG/24HR patch Place 1 patch (0.1 mg total) onto the skin 2 (two) times a week. 24 patch 3   famotidine (PEPCID) 20 MG tablet Take 1 tablet (20 mg total) by mouth daily as needed for heartburn or indigestion. 90 tablet 3   fexofenadine (ALLEGRA ALLERGY) 180 MG tablet Take 1 tablet (180 mg total) by mouth daily. 90 tablet 3   FLUoxetine (PROZAC) 10 MG tablet Take 1 tablet (10 mg total) by mouth daily. 90 tablet 1   fluticasone (FLONASE) 50 MCG/ACT nasal spray Place 1 spray into both nostrils daily. 48 g 3   levothyroxine (SYNTHROID) 50 MCG tablet Take 1 tablet (50 mcg total) by mouth daily before breakfast. 90 tablet 3   losartan (COZAAR) 50 MG tablet Take 1 tablet (50 mg total) by mouth at bedtime. 90 tablet 3   MAGNESIUM PO Take 250 mg by mouth  daily.     meclizine (ANTIVERT) 25 MG tablet Take 1 tablet (25 mg total) by mouth 3 (three) times daily as needed. 270 tablet 3   metoprolol succinate (TOPROL-XL) 50 MG 24 hr tablet Take  1 tablet (50 mg total) by mouth daily. 90 tablet 3   montelukast (SINGULAIR) 10 MG tablet Take 1 tablet (10 mg total) by mouth at bedtime. 90 tablet 3   Multiple Vitamin (MULTIVITAMIN) tablet Take 1 tablet by mouth daily.     mupirocin ointment (BACTROBAN) 2 % Apply topically.     ondansetron (ZOFRAN ODT) 4 MG disintegrating tablet Take 1 tablet (4 mg total) by mouth every 8 (eight) hours as needed for nausea or vomiting. 30 tablet 2   Propylene Glycol 0.6 % SOLN Apply 1 drop to eye as needed.     psyllium (METAMUCIL) 58.6 % powder Take 1 packet by mouth as needed.     Syringe/Needle, Disp, (SYRINGE 3CC/20GX1") 20G X 1" 3 ML MISC 1 Syringe by Does not apply route every 30 (thirty) days. For use with b-12 injection monthly 50 each 0   tretinoin (RETIN-A) 0.05 % cream APPLY TOPICALLY AT BEDTIME 45 g 1   urea (CARMOL) 10 % cream Apply topically as needed. 71 g 3   benzonatate (TESSALON) 200 MG capsule Take 1 capsule (200 mg total) by mouth 3 (three) times daily as needed for cough. (Patient not taking: Reported on 03/21/2022) 30 capsule 1   No current facility-administered medications on file prior to visit.    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 Hx of children who are grown  Vaping Use   Vaping Use: Never used  Substance Use Topics   Alcohol use: Not Currently    Alcohol/week: 0.0 standard drinks   Drug use: No    Review of Systems  Constitutional:  Negative for chills and fever.  HENT:  Positive for congestion, ear pain and sore throat.    Respiratory:  Positive for cough.   Cardiovascular:  Negative for chest pain and palpitations.  Gastrointestinal:  Negative for nausea and vomiting.     Objective:    BP 128/74   Pulse 61   Temp 98.1 F (36.7 C) (Oral)   Ht '5\' 5"'$  (1.651 m)   Wt 191 lb 3.2 oz (86.7 kg)   SpO2 96%   BMI 31.82 kg/m    Physical Exam Vitals reviewed.  Constitutional:      Appearance: She is well-developed.  HENT:     Head: Normocephalic and atraumatic.     Right Ear: Hearing, tympanic membrane, ear canal and external ear normal. No decreased hearing noted. No drainage, swelling or tenderness. No middle ear effusion. No foreign body. Tympanic membrane is not erythematous or bulging.     Left Ear: Hearing, tympanic membrane, ear canal and external ear normal. No decreased hearing noted. No drainage, swelling or tenderness.  No middle ear effusion. No foreign body. Tympanic membrane is not erythematous or bulging.     Nose: Nose normal. No rhinorrhea.     Right Sinus: No maxillary sinus tenderness or frontal sinus tenderness.     Left Sinus: No maxillary sinus tenderness or frontal sinus tenderness.     Mouth/Throat:     Pharynx: Uvula midline. No oropharyngeal exudate or posterior oropharyngeal erythema.     Tonsils: No tonsillar abscesses.  Eyes:     Conjunctiva/sclera:  Conjunctivae normal.  Cardiovascular:     Rate and Rhythm: Regular rhythm.     Pulses: Normal pulses.     Heart sounds: Normal heart sounds.  Pulmonary:     Effort: Pulmonary effort is normal.     Breath sounds: Examination of the left-upper field reveals wheezing. Wheezing present. No rhonchi or rales.     Comments: 1 expiratory wheeze Lymphadenopathy:     Head:     Right side of head: No submental, submandibular, tonsillar, preauricular, posterior auricular or occipital adenopathy.     Left side of head: No submental, submandibular, tonsillar, preauricular, posterior auricular or occipital adenopathy.     Cervical: No  cervical adenopathy.  Skin:    General: Skin is warm and dry.  Neurological:     Mental Status: She is alert.  Psychiatric:        Speech: Speech normal.        Behavior: Behavior normal.        Thought Content: Thought content normal.       Assessment & Plan:   Problem List Items Addressed This Visit       Other   Sore throat - Primary    Negative strep and COVID today.  Patient is well-appearing in no acute respiratory distress.  She is afebrile.  Duration 2 days.  I did hear 1 expiratory wheeze.  She has history of asthma, allergic rhinitis.  Agreed less likely  to be bacterial in nature. I have started her on prednisone taper.  She will continue allergy regimen as prescribed by pulmonology.  She will let me know how she is doing       Relevant Orders   POC COVID-19 (Completed)   POCT rapid strep A (Completed)   POCT rapid strep A (Completed)       I am having Joaquim Lai A. Volanda Napoleon maintain her Propylene Glycol, psyllium, Docusate Calcium (STOOL SOFTENER PO), fexofenadine, multivitamin, MAGNESIUM PO, cycloSPORINE, ondansetron, urea, EPINEPHrine, Coenzyme Q10 (COQ10 PO), albuterol, atorvastatin, Vitamin D3, cyanocobalamin, estradiol, famotidine, levothyroxine, losartan, meclizine, metoprolol succinate, montelukast, SYRINGE 3CC/20GX1", ALPRAZolam, diazepam, tretinoin, budesonide-formoterol, benzonatate, dextromethorphan-guaiFENesin, mupirocin ointment, azelastine, fluticasone, chlorpheniramine, FLUoxetine, and Premarin.   Meds ordered this encounter  Medications   DISCONTD: predniSONE (DELTASONE) 10 MG tablet    Sig: Take 4 tablets ( total 40 mg) by mouth for 2 days; take 3 tablets ( total 30 mg) by mouth for 2 days; take 2 tablets ( total 20 mg) by mouth for 1 day; take 1 tablet ( total 10 mg) by mouth for 1 day.    Dispense:  17 tablet    Refill:  0    Order Specific Question:   Supervising Provider    Answer:   Crecencio Mc [[id number]]    Return precautions given.    Risks, benefits, and alternatives of the medications and treatment plan prescribed today were discussed, and patient expressed understanding.   Education regarding symptom management and diagnosis given to patient on AVS.  Continue to follow with Crecencio Mc, MD for routine health maintenance.   Angela Mendoza and I agreed with plan.   [name], FNP

## 2022-03-21 NOTE — Assessment & Plan Note (Signed)
Negative strep and COVID today.  Patient is well-appearing in no acute respiratory distress.  She is afebrile.  Duration 2 days.  I did hear 1 expiratory wheeze.  She has history of asthma, allergic rhinitis.  Agreed less likely  to be bacterial in nature. I have started her on prednisone taper.  She will continue allergy regimen as prescribed by pulmonology.  She will let me know how she is doing

## 2022-03-21 NOTE — Patient Instructions (Signed)
Please continue albuterol and Symbicort.  Please continue allergy regimen including Singulair, azelastine Allegra and Flonase.    Start prednisone.  As discussed, pick up and start before 2 PM today as can interfere with sleep.  Prednisone can also make you more irritable and hungry.  Please let me know how you are doing and of course of any new concerns

## 2022-03-26 ENCOUNTER — Encounter: Payer: Self-pay | Admitting: Pulmonary Disease

## 2022-03-26 ENCOUNTER — Ambulatory Visit (INDEPENDENT_AMBULATORY_CARE_PROVIDER_SITE_OTHER): Payer: Medicare Other | Admitting: Pulmonary Disease

## 2022-03-26 VITALS — BP 118/64 | HR 72 | Ht 65.0 in | Wt 191.8 lb

## 2022-03-26 DIAGNOSIS — J209 Acute bronchitis, unspecified: Secondary | ICD-10-CM

## 2022-03-26 DIAGNOSIS — J4551 Severe persistent asthma with (acute) exacerbation: Secondary | ICD-10-CM

## 2022-03-26 MED ORDER — AZITHROMYCIN 250 MG PO TABS: ORAL_TABLET | ORAL | 0 refills | Status: DC

## 2022-03-26 MED ORDER — PREDNISONE 10 MG PO TABS
ORAL_TABLET | ORAL | 0 refills | Status: AC
Start: 2022-03-26 — End: 2022-04-07

## 2022-03-26 NOTE — Progress Notes (Signed)
Synopsis: Referred in May 2023 for acute visit  Subjective:   PATIENT ID: Angela Mendoza GENDER: female DOB: 08/11/1951, MRN: [id number]   HPI  Chief Complaint  Patient presents with   Acute Visit    VS pt for asthma. States she had a sore throat and runny nose last week. Tested negative for covid and strep. Started coughing on Sunday. Productive cough with yellow phlegm.    Angela Mendoza is a 71 year old woman, former smoker with OSA and asthma who returns to pulmonary clinic for acute visit due to cough.   She thought Luna CPAP humidifier was making her cough worse and this was changed to ResMed recently. She was given spacer to use with Symbicort 160-4.34mg 2 puffs twice daily at last visit on 02/10/22.   She presents today with productive cough of 4 days. She started with sore throat that progressed to sinus pressure, congestion and later cough and chest congestion. She is coughing up yellow phlegm. She was prescribed prednisone by her primary care but has not started it yet.   She denies fevers, chills or sweats.   Past Medical History:  Diagnosis Date   Agatston coronary artery calcium score less than 100    a. 06/2018 Cardiac CT: Ca2+ = 3 (49th %'ile).   Allergic rhinitis 06/09/2008   chronic   Aortic atherosclerosis ([place]    a. 06/2018 noted on chest CT.   Asthma, intrinsic 10/06/2011   controlled on qvar. SArlyce Harman12/2012:  Very mild airflow obstruction (FEV1% 68, FVL c/w obstruction)    Basal cell carcinoma 06/10/2021   right prox nasal ala rim - MOHs 07/12/21 Dr. CLacinda Axon  Chronic headache 06/09/2008   [place]    Depression    Diverticulosis 2013   h/o diverticulitis   Dry eyes    GERD (gastroesophageal reflux disease)    History of breast implant removal    silicone mastitis - R axilla silicone LN, free silicone L breast upper outer quadrant   Hot flashes    HYPERLIPIDEMIA 06/09/2008   HYPERTENSION 06/09/2008    Hypothyroidism    MVP (mitral valve prolapse)    OSA on CPAP    [name]   [place]ox[place]mal [place] 06/09/[place]   a. CHA2DS2VASc = 3-->prefers ASA.   Pernicious anemia    per prior pcp   Rosacea    Surgical menopause 1991   Vertigo    Vitamin D deficiency      Family History  Problem Relation Age of Onset   Cancer Mother 684      lung, passive smoke   Cancer Father 529      lung, smoker   Cancer Paternal Aunt        breast, lung   Cancer Cousin        breast   Stroke Sister        TIA   Hypertension Sister    Heart failure Sister    Aortic dissection Paternal Uncle    Heart attack Paternal Uncle    AAA (abdominal aortic aneurysm) Paternal Uncle    Aortic dissection Maternal Aunt    Breast cancer Maternal Aunt    Heart disease Paternal Uncle    Stroke Paternal Uncle      Social History   Socioeconomic History   Marital status: Married    Spouse name: Not on file   Number of children: Not on file   Years of education: Not  on file   Highest education level: Not on file  Occupational History   Occupation: retired  Tobacco Use   Smoking status: Former    Packs/day: 0.30    Years: 7.00    Pack years: 2.10    Types: Cigarettes    Quit date: 10/27/1977    Years since quitting: 44.4   Smokeless tobacco: Never   Tobacco comments:    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 Hx of children who are grown  Vaping Use   Vaping Use: Never used  Substance and Sexual Activity   Alcohol use: Not Currently    Alcohol/week: 0.0 standard drinks   Drug use: No   Sexual activity: Not on file  Other Topics Concern   Not on file  Social History Narrative   Lives with husband    Grown children   Occ: retired, was Gaffer   Edu: trade degree then 2 yrscollege   Social Determinants of Radio broadcast assistant Strain: Low Risk    Difficulty of Paying Living Expenses: Not hard at all  Food Insecurity: No Food Insecurity   Worried  About Charity fundraiser in the Last Year: Never true   Arboriculturist in the Last Year: Never true  Transportation Needs: No Transportation Needs   Lack of Transportation (Medical): No   Lack of Transportation (Non-Medical): No  Physical Activity: Insufficiently Active   Days of Exercise per Week: 3 days   Minutes of Exercise per Session: 20 min  Stress: No Stress Concern Present   Feeling of Stress : Not at all  Social Connections: Unknown   Frequency of Communication with Friends and Family: Not on file   Frequency of Social Gatherings with Friends and Family: More than three times a week   Attends Religious Services: Not on Electrical engineer or Organizations: Not on file   Attends Archivist Meetings: Not on file   Marital Status: Married  Human resources officer Violence: Not At Risk   Fear of Current or Ex-Partner: No   Emotionally Abused: No   Physically Abused: No   Sexually Abused: No     Allergies  Allergen Reactions   Codeine Other (See Comments)    Low blood pressure/nausea   Sulfonamide Derivatives Rash    rash     Outpatient Medications Prior to Visit  Medication Sig Dispense Refill   albuterol (PROVENTIL HFA) 108 (90 Base) MCG/ACT inhaler Inhale 2 puffs into the lungs every 6 (six) hours as needed. 18 g 2   ALPRAZolam (XANAX) 0.25 MG tablet TAKE 1 TABLET (0.25 MG TOTAL) BY MOUTH ONCE DAILY AS NEEDED FOR SLEEP. 30 tablet 5   atorvastatin (LIPITOR) 40 MG tablet Take 1 tablet (40 mg total) by mouth daily. 90 tablet 3   azelastine (ASTELIN) 0.1 % nasal spray Place 2 sprays into both nostrils 2 (two) times daily. Use in each nostril as directed 90 mL 3   budesonide-formoterol (SYMBICORT) 160-4.5 MCG/ACT inhaler Inhale 2 puffs into the lungs in the morning and at bedtime. 1 each 0   chlorpheniramine (CHLOR-TRIMETON) 4 MG tablet Take 1 tablet (4 mg total) by mouth at bedtime. 90 tablet 3   Cholecalciferol (VITAMIN D3) 50 MCG (2000 UT) TABS Take 1  tablet by mouth daily. 90 tablet 3   Coenzyme Q10 (COQ10 PO) Take by mouth daily.     conjugated estrogens (PREMARIN) vaginal cream Place vaginally 2 (two) times a week.  INSERT 0.5 APPLICATORS FULL VAGINALLY TWICE PER WEEK. AT BEDTIME. 30 g 4   cyanocobalamin (,VITAMIN B-12,) 1000 MCG/ML injection Inject 1 mL (1,000 mcg total) into the muscle every 30 (thirty) days. 3 mL 3   cycloSPORINE (RESTASIS) 0.05 % ophthalmic emulsion Place 1 drop into both eyes 2 (two) times daily. 3 each 3   dextromethorphan-guaiFENesin (MUCINEX DM) 30-600 MG 12hr tablet Take 1 tablet by mouth 2 (two) times daily as needed for cough. 60 tablet 0   diazepam (VALIUM) 5 MG tablet Take 1 tablet (5 mg total) by mouth daily as needed for anxiety. 30 tablet 2   Docusate Calcium (STOOL SOFTENER PO) Take 1 tablet by mouth as needed.     EPINEPHrine 0.3 mg/0.3 mL IJ SOAJ injection EpiPen 2-Pak 0.3 mg/0.3 mL injection, auto-injector   0.3 mg by injection route.     estradiol (VIVELLE-DOT) 0.1 MG/24HR patch Place 1 patch (0.1 mg total) onto the skin 2 (two) times a week. 24 patch 3   famotidine (PEPCID) 20 MG tablet Take 1 tablet (20 mg total) by mouth daily as needed for heartburn or indigestion. 90 tablet 3   fexofenadine (ALLEGRA ALLERGY) 180 MG tablet Take 1 tablet (180 mg total) by mouth daily. 90 tablet 3   FLUoxetine (PROZAC) 10 MG tablet Take 1 tablet (10 mg total) by mouth daily. 90 tablet 1   fluticasone (FLONASE) 50 MCG/ACT nasal spray Place 1 spray into both nostrils daily. 48 g 3   levothyroxine (SYNTHROID) 50 MCG tablet Take 1 tablet (50 mcg total) by mouth daily before breakfast. 90 tablet 3   losartan (COZAAR) 50 MG tablet Take 1 tablet (50 mg total) by mouth at bedtime. 90 tablet 3   MAGNESIUM PO Take 250 mg by mouth daily.     meclizine (ANTIVERT) 25 MG tablet Take 1 tablet (25 mg total) by mouth 3 (three) times daily as needed. 270 tablet 3   metoprolol succinate (TOPROL-XL) 50 MG 24 hr tablet Take 1 tablet (50 mg  total) by mouth daily. 90 tablet 3   montelukast (SINGULAIR) 10 MG tablet Take 1 tablet (10 mg total) by mouth at bedtime. 90 tablet 3   Multiple Vitamin (MULTIVITAMIN) tablet Take 1 tablet by mouth daily.     mupirocin ointment (BACTROBAN) 2 % Apply topically.     ondansetron (ZOFRAN ODT) 4 MG disintegrating tablet Take 1 tablet (4 mg total) by mouth every 8 (eight) hours as needed for nausea or vomiting. 30 tablet 2   Propylene Glycol 0.6 % SOLN Apply 1 drop to eye as needed.     psyllium (METAMUCIL) 58.6 % powder Take 1 packet by mouth as needed.     Syringe/Needle, Disp, (SYRINGE 3CC/20GX1") 20G X 1" 3 ML MISC 1 Syringe by Does not apply route every 30 (thirty) days. For use with b-12 injection monthly 50 each 0   tretinoin (RETIN-A) 0.05 % cream APPLY TOPICALLY AT BEDTIME 45 g 1   urea (CARMOL) 10 % cream Apply topically as needed. 71 g 3   predniSONE (DELTASONE) 10 MG tablet Take 4 tablets ( total 40 mg) by mouth for 2 days; take 3 tablets ( total 30 mg) by mouth for 2 days; take 2 tablets ( total 20 mg) by mouth for 1 day; take 1 tablet ( total 10 mg) by mouth for 1 day. 17 tablet 0   benzonatate (TESSALON) 200 MG capsule Take 1 capsule (200 mg total) by mouth 3 (three) times daily as needed for  cough. (Patient not taking: Reported on 03/21/2022) 30 capsule 1   No facility-administered medications prior to visit.   Review of Systems  Constitutional:  Negative for chills, fever, malaise/fatigue and weight loss.  HENT:  Positive for congestion, sinus pain and sore throat.   Eyes: Negative.   Respiratory:  Positive for cough and sputum production. Negative for hemoptysis, shortness of breath and wheezing.   Cardiovascular:  Negative for chest pain, palpitations, orthopnea, claudication and leg swelling.  Gastrointestinal:  Negative for abdominal pain, heartburn, nausea and vomiting.  Genitourinary: Negative.   Musculoskeletal:  Negative for joint pain and myalgias.  Skin:  Negative for  rash.  Neurological:  Negative for weakness.  Endo/Heme/Allergies: Negative.   Psychiatric/Behavioral: Negative.     Objective:   Vitals:   03/26/22 1339  BP: 118/64  Pulse: 72  SpO2: 98%  Weight: 191 lb 12.8 oz (87 kg)  Height: '5\' 5"'$  (1.651 m)    Physical Exam Constitutional:      General: She is not in acute distress.    Appearance: She is not ill-appearing.  HENT:     Head: Normocephalic and atraumatic.     Nose: Congestion and rhinorrhea present.     Mouth/Throat:     Mouth: Mucous membranes are moist.  Eyes:     General: No scleral icterus.    Conjunctiva/sclera: Conjunctivae normal.     Pupils: Pupils are equal, round, and reactive to light.  Cardiovascular:     Rate and Rhythm: Normal rate and regular rhythm.     Pulses: Normal pulses.     Heart sounds: Normal heart sounds. No murmur heard. Pulmonary:     Effort: Pulmonary effort is normal.     Breath sounds: Normal breath sounds. No wheezing, rhonchi or rales.  Abdominal:     General: Bowel sounds are normal.     Palpations: Abdomen is soft.  Musculoskeletal:     Right lower leg: No edema.     Left lower leg: No edema.  Lymphadenopathy:     Cervical: No cervical adenopathy.  Skin:    General: Skin is warm and dry.  Neurological:     General: No focal deficit present.     Mental Status: She is alert.  Psychiatric:        Mood and Affect: Mood normal.        Behavior: Behavior normal.        Thought Content: Thought content normal.        Judgment: Judgment normal.   CBC    Component Value Date/Time   WBC 7.0 02/27/2022 1301   RBC 4.09 02/27/2022 1301   HGB 12.8 02/27/2022 1301   HGB 13.7 12/07/2012 0000   HCT 39.0 02/27/2022 1301   PLT 305.0 02/27/2022 1301   MCV 95.4 02/27/2022 1301   MCH 30.8 12/29/2020 1419   MCHC 32.8 02/27/2022 1301   RDW 13.5 02/27/2022 1301   LYMPHSABS 2.0 02/27/2022 1301   MONOABS 0.7 02/27/2022 1301   EOSABS 0.1 02/27/2022 1301   BASOSABS 0.0 02/27/2022 1301       Latest Ref Rng & Units 02/27/2022    1:01 PM 05/29/2021   11:26 AM 04/04/2021   12:35 PM  BMP  Glucose 70 - 99 mg/dL 94   99     BUN 6 - 23 mg/dL 12   10     Creatinine 0.40 - 1.20 mg/dL 0.90   0.84   0.70    Sodium 135 - 145 mEq/L  136   136     Potassium 3.5 - 5.1 mEq/L 4.3   4.3     Chloride 96 - 112 mEq/L 100   100     CO2 19 - 32 mEq/L 29   28     Calcium 8.4 - 10.5 mg/dL 9.1   9.3      Chest imaging: CXR 12/16/21 The heart size and mediastinal contours are within normal limits. Both lungs are clear. Degenerative joint changes of bilateral shoulders and spine noted.  PFT:    Latest Ref Rng & Units 02/06/2016   11:40 AM  PFT Results  FVC-Pre L 2.98    FVC-Predicted Pre % 90    FVC-Post L 3.04    FVC-Predicted Post % 92    Pre FEV1/FVC % % 74    Post FEV1/FCV % % 76    FEV1-Pre L 2.19    FEV1-Predicted Pre % 87    FEV1-Post L 2.32    DLCO uncorrected ml/min/mmHg 31.04    DLCO UNC% % 121    DLVA Predicted % 87      Labs:  Path:  Echo:  Heart Catheterization:    Assessment & Plan:   Acute bronchitis, unspecified organism - Plan: azithromycin (ZITHROMAX) 250 MG tablet  Severe persistent asthma with exacerbation - Plan: predniSONE (DELTASONE) 10 MG tablet  Discussion: Sundeep Cary is a 71 year old woman, former smoker with OSA and asthma who returns to pulmonary clinic for acute visit due to cough.   She has acute bronchitis likely from viral infection with on going cough with chest congestion.   She is to take zpak for possible secondary bacterial infection along with extended prednisone taper for exacerbation of her asthma.   Continue current regimen of symbicort, montelukast, chlorpheniramine, azelastine and fluticasone nasal sprays. She can use mucinex PRN.   Follow up with Dr. Halford Chessman as scheduled.  Freda Jackson, MD [facility] Office: [phone]   Current Outpatient Medications:    albuterol (PROVENTIL HFA) 108 (90  Base) MCG/ACT inhaler, Inhale 2 puffs into the lungs every 6 (six) hours as needed., Disp: 18 g, Rfl: 2   ALPRAZolam (XANAX) 0.25 MG tablet, TAKE 1 TABLET (0.25 MG TOTAL) BY MOUTH ONCE DAILY AS NEEDED FOR SLEEP., Disp: 30 tablet, Rfl: 5   atorvastatin (LIPITOR) 40 MG tablet, Take 1 tablet (40 mg total) by mouth daily., Disp: 90 tablet, Rfl: 3   azelastine (ASTELIN) 0.1 % nasal spray, Place 2 sprays into both nostrils 2 (two) times daily. Use in each nostril as directed, Disp: 90 mL, Rfl: 3   azithromycin (ZITHROMAX) 250 MG tablet, Take as directed, Disp: 6 tablet, Rfl: 0   budesonide-formoterol (SYMBICORT) 160-4.5 MCG/ACT inhaler, Inhale 2 puffs into the lungs in the morning and at bedtime., Disp: 1 each, Rfl: 0   chlorpheniramine (CHLOR-TRIMETON) 4 MG tablet, Take 1 tablet (4 mg total) by mouth at bedtime., Disp: 90 tablet, Rfl: 3   Cholecalciferol (VITAMIN D3) 50 MCG (2000 UT) TABS, Take 1 tablet by mouth daily., Disp: 90 tablet, Rfl: 3   Coenzyme Q10 (COQ10 PO), Take by mouth daily., Disp: , Rfl:    conjugated estrogens (PREMARIN) vaginal cream, Place vaginally 2 (two) times a week. INSERT 0.5 APPLICATORS FULL VAGINALLY TWICE PER WEEK. AT BEDTIME., Disp: 30 g, Rfl: 4   cyanocobalamin (,VITAMIN B-12,) 1000 MCG/ML injection, Inject 1 mL (1,000 mcg total) into the muscle every 30 (thirty) days., Disp: 3 mL, Rfl: 3   cycloSPORINE (  RESTASIS) 0.05 % ophthalmic emulsion, Place 1 drop into both eyes 2 (two) times daily., Disp: 3 each, Rfl: 3   dextromethorphan-guaiFENesin (MUCINEX DM) 30-600 MG 12hr tablet, Take 1 tablet by mouth 2 (two) times daily as needed for cough., Disp: 60 tablet, Rfl: 0   diazepam (VALIUM) 5 MG tablet, Take 1 tablet (5 mg total) by mouth daily as needed for anxiety., Disp: 30 tablet, Rfl: 2   Docusate Calcium (STOOL SOFTENER PO), Take 1 tablet by mouth as needed., Disp: , Rfl:    EPINEPHrine 0.3 mg/0.3 mL IJ SOAJ injection, EpiPen 2-Pak 0.3 mg/0.3 mL injection, auto-injector   0.3  mg by injection route., Disp: , Rfl:    estradiol (VIVELLE-DOT) 0.1 MG/24HR patch, Place 1 patch (0.1 mg total) onto the skin 2 (two) times a week., Disp: 24 patch, Rfl: 3   famotidine (PEPCID) 20 MG tablet, Take 1 tablet (20 mg total) by mouth daily as needed for heartburn or indigestion., Disp: 90 tablet, Rfl: 3   fexofenadine (ALLEGRA ALLERGY) 180 MG tablet, Take 1 tablet (180 mg total) by mouth daily., Disp: 90 tablet, Rfl: 3   FLUoxetine (PROZAC) 10 MG tablet, Take 1 tablet (10 mg total) by mouth daily., Disp: 90 tablet, Rfl: 1   fluticasone (FLONASE) 50 MCG/ACT nasal spray, Place 1 spray into both nostrils daily., Disp: 48 g, Rfl: 3   levothyroxine (SYNTHROID) 50 MCG tablet, Take 1 tablet (50 mcg total) by mouth daily before breakfast., Disp: 90 tablet, Rfl: 3   losartan (COZAAR) 50 MG tablet, Take 1 tablet (50 mg total) by mouth at bedtime., Disp: 90 tablet, Rfl: 3   MAGNESIUM PO, Take 250 mg by mouth daily., Disp: , Rfl:    meclizine (ANTIVERT) 25 MG tablet, Take 1 tablet (25 mg total) by mouth 3 (three) times daily as needed., Disp: 270 tablet, Rfl: 3   metoprolol succinate (TOPROL-XL) 50 MG 24 hr tablet, Take 1 tablet (50 mg total) by mouth daily., Disp: 90 tablet, Rfl: 3   montelukast (SINGULAIR) 10 MG tablet, Take 1 tablet (10 mg total) by mouth at bedtime., Disp: 90 tablet, Rfl: 3   Multiple Vitamin (MULTIVITAMIN) tablet, Take 1 tablet by mouth daily., Disp: , Rfl:    mupirocin ointment (BACTROBAN) 2 %, Apply topically., Disp: , Rfl:    ondansetron (ZOFRAN ODT) 4 MG disintegrating tablet, Take 1 tablet (4 mg total) by mouth every 8 (eight) hours as needed for nausea or vomiting., Disp: 30 tablet, Rfl: 2   predniSONE (DELTASONE) 10 MG tablet, Take 4 tablets (40 mg total) by mouth daily with breakfast for 3 days, THEN 3 tablets (30 mg total) daily with breakfast for 3 days, THEN 2 tablets (20 mg total) daily with breakfast for 3 days, THEN 1 tablet (10 mg total) daily with breakfast for 3  days., Disp: 30 tablet, Rfl: 0   Propylene Glycol 0.6 % SOLN, Apply 1 drop to eye as needed., Disp: , Rfl:    psyllium (METAMUCIL) 58.6 % powder, Take 1 packet by mouth as needed., Disp: , Rfl:    Syringe/Needle, Disp, (SYRINGE 3CC/20GX1") 20G X 1" 3 ML MISC, 1 Syringe by Does not apply route every 30 (thirty) days. For use with b-12 injection monthly, Disp: 50 each, Rfl: 0   tretinoin (RETIN-A) 0.05 % cream, APPLY TOPICALLY AT BEDTIME, Disp: 45 g, Rfl: 1   urea (CARMOL) 10 % cream, Apply topically as needed., Disp: 71 g, Rfl: 3

## 2022-03-26 NOTE — Patient Instructions (Signed)
Start Zpak antibiotic  Start prednisone taper  Follow up as scheduled with Dr. Halford Chessman.   Call clinic in 2 weeks if still not feeling better

## 2022-03-30 ENCOUNTER — Encounter: Payer: Self-pay | Admitting: Pulmonary Disease

## 2022-04-18 ENCOUNTER — Ambulatory Visit
Admission: RE | Admit: 2022-04-18 | Discharge: 2022-04-18 | Disposition: A | Discharge status: Home / Self Care | Payer: Medicare Other | Source: Ambulatory Visit | Attending: Vascular Surgery | Admitting: Vascular Surgery

## 2022-04-18 DIAGNOSIS — Z1231 Encounter for screening mammogram for malignant neoplasm of breast: Secondary | ICD-10-CM

## 2022-04-23 DIAGNOSIS — C44311 Basal cell carcinoma of skin of nose: Secondary | ICD-10-CM | POA: Diagnosis not present

## 2022-05-05 ENCOUNTER — Telehealth: Payer: Self-pay | Admitting: Pulmonary Disease

## 2022-05-05 DIAGNOSIS — N3946 Mixed incontinence: Secondary | ICD-10-CM | POA: Diagnosis not present

## 2022-05-05 DIAGNOSIS — N993 Prolapse of vaginal vault after hysterectomy: Secondary | ICD-10-CM | POA: Diagnosis not present

## 2022-05-05 MED ORDER — BECLOMETHASONE DIPROP HFA 80 MCG/ACT IN AERB: 2.0000 | INHALATION_SPRAY | Freq: Two times a day (BID) | RESPIRATORY_TRACT | 6 refills | Status: DC

## 2022-05-05 NOTE — Telephone Encounter (Signed)
Called and spoke with patient. She stated that she has been hoarse on and off for the past few weeks. She believes it is coming from the Symbicort inhaler. She has also noticed that her throat has felt "raw" as well and each time that she looks at her throat, it appears red. Denied any fevers or feeling like her throat has been closing up. She confirmed that she has been rinsing her mouth after each use.   She wants to know if she can go back to Qvar 10mg. She currently has one Qvar inhaler at home but will need to have refills sent to [facility].   Dr. SHalford Chessman can you please advise? Thanks!

## 2022-05-05 NOTE — Telephone Encounter (Signed)
Called and spoke to patient and told her to stop the Symbicort inhaler per Dr Halford Chessman. Advised her to not throw away inhaler just incase. Sent in refills for Qvar inhaler. Nothing further needed

## 2022-05-05 NOTE — Telephone Encounter (Signed)
Please send refill for Qvar 80 mcg two puffs bid.  Can cancel symbicort.

## 2022-05-08 NOTE — Progress Notes (Signed)
Cardiology Office Note  Date:  05/09/2022   ID:  ALIJAH Mendoza, DOB [birth year], MRN [id number]  PCP:  Crecencio Mc, MD   Chief Complaint  Patient presents with   Other    12 Month f/u pt would like to discuss weight gain, medications and upcoming surgery. Meds reviewed verbally with pt.    HPI:  Angela Mendoza is a 71 year old woman with past medical history of Remote atrial fib 2016, details unclear, outside facility report,  Palpitations anxiety OSA, on CPAP Who presents for follow-up of her palpitations, HTN, atrial fib  Last seen by myself: July 2022 Feels well Rare asthma/URI requiring prednisone  Eating out more, no regular exercise program Weight gain 182 to 196 pounds in past year  Continues to have arm bruising, not on anticoagulation/aspirin  No chest pain or shortness of breath on exertion Bought an exercise bike, started to do exercise at home  We will need to have surgery at [facility], urologic/GYN, details unclear  EKG personally reviewed by myself on todays visit Normal sinus rhythm rate 59 bpm nonspecific T wave abnormality, no change from prior EKGs  Other past medical Seen in the emergency room 12/29/2020 for chest pain.  Blood pressure was severely elevated.  Troponin was normal and EKG showed no ischemic changes.  She was given IV fluids for dizziness.  CT of the head was negative   Stress at home, husband with medical issues  Echo 12/2020 reviewed 1. Left ventricular ejection fraction, by estimation, is 60 to 65%. The  left ventricle has normal function. The left ventricle has no regional  wall motion abnormalities. Left ventricular diastolic parameters are  consistent with Grade II diastolic  dysfunction (pseudonormalization).   2. Right ventricular systolic function is normal. The right ventricular  size is normal. There is mildly elevated pulmonary artery systolic  pressure. The estimated right ventricular systolic pressure is 94.4 mmHg.   3.  The mitral valve is normal in structure. Mild mitral valve  regurgitation.   Cardiac CTA images  1. Coronary calcium score of 0. Patient is low risk for coronary events 2. Normal coronary origin with right dominance. 3. No evidence of CAD. 4. CAD-RADS 0. Consider non-atherosclerotic causes of chest pain  Has sleep apnea on CPAP Sees Dr. Halford Chessman for asthma  MRI Microvascular changes  Previous issues with breast implants  PMH:   has a past medical history of [place] coronary artery calcium score less than 100, Allergic rhinitis (06/09/2008), Aortic atherosclerosis ([place]), Asthma, intrinsic (10/06/2011), Basal cell carcinoma (06/10/2021), Chronic headache (06/09/2008), COPD (chronic obstructive pulmonary disease) ([place]), Depression, Diverticulosis (2013), Dry eyes, GERD (gastroesophageal reflux disease), History of breast implant removal, Hot flashes, HYPERLIPIDEMIA (06/09/2008), HYPERTENSION (06/09/2008), Hypothyroidism, MVP (mitral valve prolapse), OSA on CPAP, PAF (paroxysmal atrial fibrillation) ([place]) (06/09/2008), Pernicious anemia, Rosacea, Surgical menopause (1991), Vertigo, and Vitamin D deficiency.  PSH:    Past Surgical History:  Procedure Laterality Date   APPENDECTOMY  2007   BREAST BIOPSY Left 09/27/2009   BREAST ENHANCEMENT SURGERY  2006   CARDIOVASCULAR STRESS TEST  2013   [place] [name] - WNL, EF 55-60%, with 0 calcium score   CHOLECYSTECTOMY  2007   biliary dyskinesia   COLONOSCOPY  08/2012   diverticulosis   COLONOSCOPY  2006   inflamed hyperplastic polyp   ESOPHAGOGASTRODUODENOSCOPY  08/2012   esophageal reflux   HERNIA REPAIR     NASAL SINUS SURGERY     RECTOCELE REPAIR  12/2008   [name]  sleep study  08/2007   OSA, 12 cm H2O,   TOTAL ABDOMINAL HYSTERECTOMY  1991   heavy bleeding, ovaries removed   TUBAL LIGATION     US ECHOCARDIOGRAPHY  09/2009   impaired relaxation, mild tric regurg, normal MV, EF 60%   US ECHOCARDIOGRAPHY  11/2007   mild-mod MR     Current Outpatient Medications  Medication Sig Dispense Refill   albuterol (PROVENTIL HFA) 108 (90 Base) MCG/ACT inhaler Inhale 2 puffs into the lungs every 6 (six) hours as needed. 18 g 2   ALPRAZolam (XANAX) 0.25 MG tablet TAKE 1 TABLET (0.25 MG TOTAL) BY MOUTH ONCE DAILY AS NEEDED FOR SLEEP. 30 tablet 5   atorvastatin (LIPITOR) 40 MG tablet Take 1 tablet (40 mg total) by mouth daily. 90 tablet 3   azelastine (ASTELIN) 0.1 % nasal spray Place 2 sprays into both nostrils 2 (two) times daily. Use in each nostril as directed 90 mL 3   beclomethasone (QVAR) 80 MCG/ACT inhaler Inhale 2 puffs into the lungs 2 (two) times daily. 3 each 6   chlorpheniramine (CHLOR-TRIMETON) 4 MG tablet Take 1 tablet (4 mg total) by mouth at bedtime. 90 tablet 3   Cholecalciferol (VITAMIN D3) 50 MCG (2000 UT) TABS Take 1 tablet by mouth daily. 90 tablet 3   Coenzyme Q10 (COQ10 PO) Take by mouth daily.     conjugated estrogens (PREMARIN) vaginal cream Place vaginally 2 (two) times a week. INSERT 0.5 APPLICATORS FULL VAGINALLY TWICE PER WEEK. AT BEDTIME. 30 g 4   cyanocobalamin (,VITAMIN B-12,) 1000 MCG/ML injection Inject 1 mL (1,000 mcg total) into the muscle every 30 (thirty) days. 3 mL 3   cycloSPORINE (RESTASIS) 0.05 % ophthalmic emulsion Place 1 drop into both eyes 2 (two) times daily. 3 each 3   dextromethorphan-guaiFENesin (MUCINEX DM) 30-600 MG 12hr tablet Take 1 tablet by mouth 2 (two) times daily as needed for cough. 60 tablet 0   diazepam (VALIUM) 5 MG tablet Take 1 tablet (5 mg total) by mouth daily as needed for anxiety. (Patient taking differently: Take 5 mg by mouth daily as needed (vertigo).) 30 tablet 2   Docusate Calcium (STOOL SOFTENER PO) Take 1 tablet by mouth as needed.     EPINEPHrine 0.3 mg/0.3 mL IJ SOAJ injection EpiPen 2-Pak 0.3 mg/0.3 mL injection, auto-injector   0.3 mg by injection route.     estradiol (VIVELLE-DOT) 0.1 MG/24HR patch Place 1 patch (0.1 mg total) onto the skin 2 (two)  times a week. 24 patch 3   famotidine (PEPCID) 20 MG tablet Take 1 tablet (20 mg total) by mouth daily as needed for heartburn or indigestion. 90 tablet 3   fexofenadine (ALLEGRA ALLERGY) 180 MG tablet Take 1 tablet (180 mg total) by mouth daily. 90 tablet 3   FLUoxetine (PROZAC) 10 MG tablet Take 1 tablet (10 mg total) by mouth daily. (Patient taking differently: Take 5 mg by mouth daily.) 90 tablet 1   fluticasone (FLONASE) 50 MCG/ACT nasal spray Place 1 spray into both nostrils daily. 48 g 3   levothyroxine (SYNTHROID) 50 MCG tablet Take 1 tablet (50 mcg total) by mouth daily before breakfast. 90 tablet 3   losartan (COZAAR) 50 MG tablet Take 1 tablet (50 mg total) by mouth at bedtime. 90 tablet 3   MAGNESIUM PO Take 250 mg by mouth daily.     meclizine (ANTIVERT) 25 MG tablet Take 1 tablet (25 mg total) by mouth 3 (three) times daily as needed. (Patient taking differently:  Take 25 mg by mouth 3 (three) times daily as needed (vertigo).) 270 tablet 3   metoprolol succinate (TOPROL-XL) 50 MG 24 hr tablet Take 1 tablet (50 mg total) by mouth daily. 90 tablet 3   montelukast (SINGULAIR) 10 MG tablet Take 1 tablet (10 mg total) by mouth at bedtime. 90 tablet 3   Multiple Vitamin (MULTIVITAMIN) tablet Take 1 tablet by mouth daily.     mupirocin ointment (BACTROBAN) 2 % Apply topically.     ondansetron (ZOFRAN ODT) 4 MG disintegrating tablet Take 1 tablet (4 mg total) by mouth every 8 (eight) hours as needed for nausea or vomiting. 30 tablet 2   Propylene Glycol 0.6 % SOLN Apply 1 drop to eye as needed.     psyllium (METAMUCIL) 58.6 % powder Take 1 packet by mouth as needed.     Syringe/Needle, Disp, (SYRINGE 3CC/20GX1") 20G X 1" 3 ML MISC 1 Syringe by Does not apply route every 30 (thirty) days. For use with b-12 injection monthly 50 each 0   tretinoin (RETIN-A) 0.05 % cream APPLY TOPICALLY AT BEDTIME 45 g 1   urea (CARMOL) 10 % cream Apply topically as needed. 71 g 3   budesonide-formoterol  (SYMBICORT) 160-4.5 MCG/ACT inhaler Inhale 2 puffs into the lungs in the morning and at bedtime. (Patient not taking: Reported on 05/09/2022) 1 each 0   No current facility-administered medications for this visit.    Allergies:   Codeine and Sulfonamide derivatives   Social History:  The patient  reports that she quit smoking about 44 years ago. Her smoking use included cigarettes. She has a 2.10 pack-year smoking history. She has never used smokeless tobacco. She reports that she does not currently use alcohol. She reports that she does not use drugs.   Family History:   family history includes AAA (abdominal aortic aneurysm) in her paternal uncle; Aortic dissection in her maternal aunt and paternal uncle; Breast cancer in her maternal aunt; Cancer in her cousin and paternal aunt; Cancer (age of onset: 51) in her father; Cancer (age of onset: 72) in her mother; Heart attack in her paternal uncle; Heart disease in her paternal uncle; Heart failure in her sister; Hypertension in her sister; Stroke in her paternal uncle and sister.   Review of Systems: Review of Systems  Constitutional: Negative.   Respiratory: Negative.    Cardiovascular: Negative.   Gastrointestinal: Negative.   Musculoskeletal: Negative.   Neurological: Negative.   Psychiatric/Behavioral:  The patient is nervous/anxious.   All other systems reviewed and are negative.   PHYSICAL EXAM: VS:  BP 138/78 (BP Location: Left Arm, Patient Position: Sitting, Cuff Size: Large)   Pulse (!) 59   Ht '5\' 5"'$  (1.651 m)   Wt 196 lb 8 oz (89.1 kg)   SpO2 96%   BMI 32.70 kg/m  , BMI Body mass index is 32.7 kg/m. Constitutional:  oriented to person, place, and time. No distress.  HENT:  Head: Grossly normal Eyes:  no discharge. No scleral icterus.  Neck: No JVD, no carotid bruits  Cardiovascular: Regular rate and rhythm, no murmurs appreciated Pulmonary/Chest: Clear to auscultation bilaterally, no wheezes or rails Abdominal: Soft.   no distension.  no tenderness.  Musculoskeletal: Normal range of motion Neurological:  normal muscle tone. Coordination normal. No atrophy Skin: Skin warm and dry Psychiatric: normal affect, pleasant  Recent Labs: 05/29/2021: TSH 1.99 02/27/2022: ALT 30; BUN 12; Creatinine, Ser 0.90; Hemoglobin 12.8; Platelets 305.0; Potassium 4.3; Sodium 136    Lipid  Panel Lab Results  Component Value Date   CHOL 148 05/29/2021   HDL 51.80 05/29/2021   LDLCALC 71 05/29/2021   TRIG 127.0 05/29/2021    Wt Readings from Last 3 Encounters:  05/09/22 196 lb 8 oz (89.1 kg)  03/26/22 191 lb 12.8 oz (87 kg)  03/21/22 191 lb 3.2 oz (86.7 kg)     ASSESSMENT AND PLAN: Preop surgery Acceptable risk for surgery at [facility] No further cardiac testing needed  Paroxysmal atrial fibrillation ([place]) Per report noted in 2016, details unclear Previous discussions concerning risk and benefit of anticoagulation Currently not on anticoagulation, no recurrence of her atrial fibrillation Previously discussed using apple watch with software, cardia mobile, monitoring of pulse using other devices  Obesity We have encouraged continued exercise, careful diet management in an effort to lose weight.  Chest pain No recent chest pain symptoms cardiac CTA with no significant disease No further cardiac work-up needed  Anxiety Prior stressors, managed by primary care Has previously tried SSRIs Reassurance provided  OSA on CPAP Managed by pulmonary in [place]  Uncomplicated asthma, unspecified asthma severity, unspecified whether persistent Managed by Dr. Jodene Nam recently  Hypertension Blood pressure is well controlled on today's visit. No changes made to the medications. For any worsening bradycardia could decrease dose of metoprolol   Total encounter time more than 30 minutes  Greater than 50% was spent in counseling and coordination of care with the patient      Orders Placed This Encounter   Procedures   EKG 12-Lead     Signed, Esmond Plants, M.D., Ph.D. 05/09/2022  [place] [place], [place]

## 2022-05-09 ENCOUNTER — Encounter: Payer: Self-pay | Admitting: Cardiovascular Disease

## 2022-05-09 ENCOUNTER — Ambulatory Visit (INDEPENDENT_AMBULATORY_CARE_PROVIDER_SITE_OTHER): Payer: Medicare Other | Admitting: Internal Medicine

## 2022-05-09 ENCOUNTER — Ambulatory Visit (INDEPENDENT_AMBULATORY_CARE_PROVIDER_SITE_OTHER): Payer: Medicare Other | Admitting: Cardiovascular Disease

## 2022-05-09 ENCOUNTER — Encounter: Payer: Self-pay | Admitting: Internal Medicine

## 2022-05-09 VITALS — BP 114/68 | HR 69 | Temp 98.1°F | Ht 65.0 in | Wt 196.4 lb

## 2022-05-09 VITALS — BP 138/78 | HR 59 | Ht 65.0 in | Wt 196.5 lb

## 2022-05-09 DIAGNOSIS — R7301 Impaired fasting glucose: Secondary | ICD-10-CM | POA: Diagnosis not present

## 2022-05-09 DIAGNOSIS — F411 Generalized anxiety disorder: Secondary | ICD-10-CM

## 2022-05-09 DIAGNOSIS — I6789 Other cerebrovascular disease: Secondary | ICD-10-CM

## 2022-05-09 DIAGNOSIS — I10 Essential (primary) hypertension: Secondary | ICD-10-CM

## 2022-05-09 DIAGNOSIS — R079 Chest pain, unspecified: Secondary | ICD-10-CM | POA: Diagnosis not present

## 2022-05-09 DIAGNOSIS — I059 Rheumatic mitral valve disease, unspecified: Secondary | ICD-10-CM | POA: Diagnosis not present

## 2022-05-09 DIAGNOSIS — E782 Mixed hyperlipidemia: Secondary | ICD-10-CM

## 2022-05-09 DIAGNOSIS — D699 Hemorrhagic condition, unspecified: Secondary | ICD-10-CM | POA: Diagnosis not present

## 2022-05-09 DIAGNOSIS — E034 Atrophy of thyroid (acquired): Secondary | ICD-10-CM | POA: Diagnosis not present

## 2022-05-09 DIAGNOSIS — N8110 Cystocele, unspecified: Secondary | ICD-10-CM | POA: Diagnosis not present

## 2022-05-09 DIAGNOSIS — J45909 Unspecified asthma, uncomplicated: Secondary | ICD-10-CM | POA: Diagnosis not present

## 2022-05-09 DIAGNOSIS — I480 Paroxysmal atrial fibrillation: Secondary | ICD-10-CM | POA: Diagnosis not present

## 2022-05-09 MED ORDER — NYSTATIN 100000 UNIT/ML MT SUSP: OROMUCOSAL | 0 refills | Status: DC

## 2022-05-09 NOTE — Patient Instructions (Addendum)
You can continue  to taper the prozac  as follows: take 5 mg daily for 2 weeks  or just 10 days,  then reduce dose  to 5 mg every other day for the same 10-14 days ,   then stop   I am prescribing Nystatin suspension to swish and swallow (or spit out) once daily after your use your steroid inhaler  TO PREVENT THRUSH

## 2022-05-09 NOTE — Patient Instructions (Signed)
Medication Instructions:  No changes  If you need a refill on your cardiac medications before your next appointment, please call your pharmacy.   Lab work: No new labs needed  Testing/Procedures: No new testing needed  Follow-Up: At [facility], you and your health needs are our priority.  As part of our continuing mission to provide you with exceptional heart care, we have created designated Provider Care Teams.  These Care Teams include your primary Cardiologist (physician) and Advanced Practice Providers (APPs -  Physician Assistants and Nurse Practitioners) who all work together to provide you with the care you need, when you need it.  You will need a follow up appointment as needed  Providers on your designated Care Team:   Christopher Berge, NP Ryan Dunn, PA-C Cadence Furth, PA-C  COVID-19 Vaccine Information can be found at: [email or web address] For questions related to vaccine distribution or appointments, please email [email or web address] or call [phone].    

## 2022-05-09 NOTE — Progress Notes (Unsigned)
Subjective:  Patient ID: Angela Mendoza, female    DOB: June 30, 1951  Age: 71 y.o. MRN: [id number]  CC: There were no encounter diagnoses.   HPI Angela Mendoza presents for follow up on depression Chief Complaint  Patient presents with   Follow-up    Follow up on medication    1) MD:  has been taking fluoxetine for over one year  currently  on 5 mg daily  since Sunday . Home stressors have improved .  Husband doing better.  Bipolar dtr no longer in her life.   Major life decisions are now decided and   2) Vaginal prolapse.    3)  weight gain:  had several rounds of prednisone prescribed by  Outpatient Medications Prior to Visit  Medication Sig Dispense Refill   albuterol (PROVENTIL HFA) 108 (90 Base) MCG/ACT inhaler Inhale 2 puffs into the lungs every 6 (six) hours as needed. 18 g 2   ALPRAZolam (XANAX) 0.25 MG tablet TAKE 1 TABLET (0.25 MG TOTAL) BY MOUTH ONCE DAILY AS NEEDED FOR SLEEP. 30 tablet 5   atorvastatin (LIPITOR) 40 MG tablet Take 1 tablet (40 mg total) by mouth daily. 90 tablet 3   azelastine (ASTELIN) 0.1 % nasal spray Place 2 sprays into both nostrils 2 (two) times daily. Use in each nostril as directed 90 mL 3   beclomethasone (QVAR) 80 MCG/ACT inhaler Inhale 2 puffs into the lungs 2 (two) times daily. 3 each 6   budesonide-formoterol (SYMBICORT) 160-4.5 MCG/ACT inhaler Inhale 2 puffs into the lungs in the morning and at bedtime. (Patient not taking: Reported on 05/09/2022) 1 each 0   chlorpheniramine (CHLOR-TRIMETON) 4 MG tablet Take 1 tablet (4 mg total) by mouth at bedtime. 90 tablet 3   Cholecalciferol (VITAMIN D3) 50 MCG (2000 UT) TABS Take 1 tablet by mouth daily. 90 tablet 3   Coenzyme Q10 (COQ10 PO) Take by mouth daily.     conjugated estrogens (PREMARIN) vaginal cream Place vaginally 2 (two) times a week. INSERT 0.5 APPLICATORS FULL VAGINALLY TWICE PER WEEK. AT BEDTIME. 30 g 4   cyanocobalamin (,VITAMIN B-12,) 1000 MCG/ML injection Inject 1 mL (1,000 mcg total)  into the muscle every 30 (thirty) days. 3 mL 3   cycloSPORINE (RESTASIS) 0.05 % ophthalmic emulsion Place 1 drop into both eyes 2 (two) times daily. 3 each 3   dextromethorphan-guaiFENesin (MUCINEX DM) 30-600 MG 12hr tablet Take 1 tablet by mouth 2 (two) times daily as needed for cough. 60 tablet 0   diazepam (VALIUM) 5 MG tablet Take 1 tablet (5 mg total) by mouth daily as needed for anxiety. (Patient taking differently: Take 5 mg by mouth daily as needed (vertigo).) 30 tablet 2   Docusate Calcium (STOOL SOFTENER PO) Take 1 tablet by mouth as needed.     EPINEPHrine 0.3 mg/0.3 mL IJ SOAJ injection EpiPen 2-Pak 0.3 mg/0.3 mL injection, auto-injector   0.3 mg by injection route.     estradiol (VIVELLE-DOT) 0.1 MG/24HR patch Place 1 patch (0.1 mg total) onto the skin 2 (two) times a week. 24 patch 3   famotidine (PEPCID) 20 MG tablet Take 1 tablet (20 mg total) by mouth daily as needed for heartburn or indigestion. 90 tablet 3   fexofenadine (ALLEGRA ALLERGY) 180 MG tablet Take 1 tablet (180 mg total) by mouth daily. 90 tablet 3   FLUoxetine (PROZAC) 10 MG tablet Take 1 tablet (10 mg total) by mouth daily. (Patient taking differently: Take 5 mg by mouth daily.) 90  tablet 1   fluticasone (FLONASE) 50 MCG/ACT nasal spray Place 1 spray into both nostrils daily. 48 g 3   levothyroxine (SYNTHROID) 50 MCG tablet Take 1 tablet (50 mcg total) by mouth daily before breakfast. 90 tablet 3   losartan (COZAAR) 50 MG tablet Take 1 tablet (50 mg total) by mouth at bedtime. 90 tablet 3   MAGNESIUM PO Take 250 mg by mouth daily.     meclizine (ANTIVERT) 25 MG tablet Take 1 tablet (25 mg total) by mouth 3 (three) times daily as needed. (Patient taking differently: Take 25 mg by mouth 3 (three) times daily as needed (vertigo).) 270 tablet 3   metoprolol succinate (TOPROL-XL) 50 MG 24 hr tablet Take 1 tablet (50 mg total) by mouth daily. 90 tablet 3   montelukast (SINGULAIR) 10 MG tablet Take 1 tablet (10 mg total) by  mouth at bedtime. 90 tablet 3   Multiple Vitamin (MULTIVITAMIN) tablet Take 1 tablet by mouth daily.     mupirocin ointment (BACTROBAN) 2 % Apply topically.     ondansetron (ZOFRAN ODT) 4 MG disintegrating tablet Take 1 tablet (4 mg total) by mouth every 8 (eight) hours as needed for nausea or vomiting. 30 tablet 2   Propylene Glycol 0.6 % SOLN Apply 1 drop to eye as needed.     psyllium (METAMUCIL) 58.6 % powder Take 1 packet by mouth as needed.     Syringe/Needle, Disp, (SYRINGE 3CC/20GX1") 20G X 1" 3 ML MISC 1 Syringe by Does not apply route every 30 (thirty) days. For use with b-12 injection monthly 50 each 0   tretinoin (RETIN-A) 0.05 % cream APPLY TOPICALLY AT BEDTIME 45 g 1   urea (CARMOL) 10 % cream Apply topically as needed. 71 g 3   No facility-administered medications prior to visit.    Review of Systems;  Patient denies headache, fevers, malaise, unintentional weight loss, skin rash, eye pain, sinus congestion and sinus pain, sore throat, dysphagia,  hemoptysis , cough, dyspnea, wheezing, chest pain, palpitations, orthopnea, edema, abdominal pain, nausea, melena, diarrhea, constipation, flank pain, dysuria, hematuria, urinary  Frequency, nocturia, numbness, tingling, seizures,  Focal weakness, Loss of consciousness,  Tremor, insomnia, depression, anxiety, and suicidal ideation.      Objective:  BP 114/68 (BP Location: Left Arm, Patient Position: Sitting, Cuff Size: Large)   Pulse 69   Temp 98.1 F (36.7 C) (Oral)   Ht '5\' 5"'$  (1.651 m)   Wt 196 lb 6.4 oz (89.1 kg)   SpO2 96%   BMI 32.68 kg/m   BP Readings from Last 3 Encounters:  05/09/22 114/68  05/09/22 138/78  03/26/22 118/64    Wt Readings from Last 3 Encounters:  05/09/22 196 lb 6.4 oz (89.1 kg)  05/09/22 196 lb 8 oz (89.1 kg)  03/26/22 191 lb 12.8 oz (87 kg)    General appearance: alert, cooperative and appears stated age Ears: normal TM's and external ear canals both ears Throat: lips, mucosa, and tongue  normal; teeth and gums normal Neck: no adenopathy, no carotid bruit, supple, symmetrical, trachea midline and thyroid not enlarged, symmetric, no tenderness/mass/nodules Back: symmetric, no curvature. ROM normal. No CVA tenderness. Lungs: clear to auscultation bilaterally Heart: regular rate and rhythm, S1, S2 normal, no murmur, click, rub or gallop Abdomen: soft, non-tender; bowel sounds normal; no masses,  no organomegaly Pulses: 2+ and symmetric Skin: Skin color, texture, turgor normal. No rashes or lesions Lymph nodes: Cervical, supraclavicular, and axillary nodes normal.  Lab Results  Component Value  Date   HGBA1C 5.9 10/12/2015    Lab Results  Component Value Date   CREATININE 0.90 02/27/2022   CREATININE 0.84 05/29/2021   CREATININE 0.70 04/04/2021    Lab Results  Component Value Date   WBC 7.0 02/27/2022   HGB 12.8 02/27/2022   HCT 39.0 02/27/2022   PLT 305.0 02/27/2022   GLUCOSE 94 02/27/2022   CHOL 148 05/29/2021   TRIG 127.0 05/29/2021   HDL 51.80 05/29/2021   LDLDIRECT 83.0 05/28/2020   LDLCALC 71 05/29/2021   ALT 30 02/27/2022   AST 27 02/27/2022   NA 136 02/27/2022   K 4.3 02/27/2022   CL 100 02/27/2022   CREATININE 0.90 02/27/2022   BUN 12 02/27/2022   CO2 29 02/27/2022   TSH 1.99 05/29/2021   INR 0.9 02/27/2022   HGBA1C 5.9 10/12/2015    MM 3D SCREEN BREAST BILATERAL  Result Date: 04/21/2022 CLINICAL DATA:  Screening. EXAM: DIGITAL SCREENING BILATERAL MAMMOGRAM WITH TOMOSYNTHESIS AND CAD TECHNIQUE: Bilateral screening digital craniocaudal and mediolateral oblique mammograms were obtained. Bilateral screening digital breast tomosynthesis was performed. The images were evaluated with computer-aided detection. COMPARISON:  Previous exam(s). ACR Breast Density Category b: There are scattered areas of fibroglandular density. FINDINGS: There are no findings suspicious for malignancy. IMPRESSION: No mammographic evidence of malignancy. A result letter of this  screening mammogram will be mailed directly to the patient. RECOMMENDATION: Screening mammogram in one year. (Code:SM-B-01Y) BI-RADS CATEGORY  1: Negative. Electronically Signed   By: Lajean Manes M.D.   On: 04/21/2022 10:48    Assessment & Plan:   Problem List Items Addressed This Visit   None   I spent a total of   minutes with this patient in a face to face visit on the date of this encounter reviewing the last office visit with me on        ,  most recent with patient's cardiologist in    ,  patient'ss diet and eating habits, home blood pressure readings ,  most recent imaging study ,   and post visit ordering of testing and therapeutics.    Follow-up: No follow-ups on file.   Crecencio Mc, MD

## 2022-05-11 NOTE — Assessment & Plan Note (Signed)
Well controlled on current regimen. Renal function stable, no changes today. 

## 2022-05-11 NOTE — Assessment & Plan Note (Signed)
Corrective surgery planned for the fall.

## 2022-05-11 NOTE — Assessment & Plan Note (Signed)
Advised to continue Symbicort and use nystatin suspension after each use.

## 2022-05-11 NOTE — Assessment & Plan Note (Addendum)
Managed with prozac , prn valium with resolution of symptoms.   prozac wean outlined elexa

## 2022-05-27 ENCOUNTER — Telehealth: Payer: Self-pay | Admitting: Pulmonary Disease

## 2022-05-27 ENCOUNTER — Other Ambulatory Visit: Payer: Medicare Other

## 2022-05-27 MED ORDER — FLUCONAZOLE 150 MG PO TABS: 150.0000 mg | ORAL_TABLET | Freq: Every day | ORAL | 0 refills | Status: DC

## 2022-05-27 MED ORDER — SPIRIVA RESPIMAT 1.25 MCG/ACT IN AERS: 2.0000 | INHALATION_SPRAY | Freq: Every day | RESPIRATORY_TRACT | 0 refills | Status: DC

## 2022-05-27 NOTE — Telephone Encounter (Signed)
Called and spoke with patient. She stated that she was on Symbicort 162mg before and it caused her throat to feel raw all of the time. She used with a spacer and rinsed her mouth each time that she used it but it still caused the irritation. I explained to her that Beth recommended sending in the lower dose but she refused.   She also refused the Nystatin solution because she is on a "sugar free diet" and Nystatin has artificial sweetener in it. She prefers Diflucan.   Beth, can you please advise? Thanks!

## 2022-05-27 NOTE — Telephone Encounter (Signed)
I called the patient and she reports that she is rinsing and is getting the white patches in her mouth and she wants to know if she can get the oral medications for her thrush. She reports the liquid as it does not help to take the thrush away.  She has a dry cough and had shortness of breath with exertion and does not feel the Qvar is helping.   She was on Symbicort in the past and it helped some but it made her throat raw and wants to know if you recommend something else in the mean time as she is a Dr. Halford Chessman patient. Please advise?

## 2022-05-27 NOTE — Telephone Encounter (Signed)
ICS is the main treatment for asthma which unfortunately runs the risk for developing thrush. She can go back on low dose Symbicort if she wants and maybe adding a spacer will help. Please send in Nystatin S/S QID for thrush symptoms

## 2022-05-27 NOTE — Telephone Encounter (Signed)
Called and spoke with patient. She verbalized understanding of recommendations. Diflucan will be sent in for her as well as the Spiriva. Advised her I will attach a note for the pharmacist to show her how to use the device.   Nothing further needed at time of call.

## 2022-05-27 NOTE — Telephone Encounter (Signed)
Yes, we can send in diflucan '150mg'$  x 1; may repeat in 72 hours if needed. Give her two tablets without refills. Would just continued albuterol as needed. If she is insistent on being on some sort of maintenance inhaler could consider spiriva 1.36mg two puff daily in the morning. This is a bronchodilator without a steriod.

## 2022-05-28 ENCOUNTER — Ambulatory Visit (INDEPENDENT_AMBULATORY_CARE_PROVIDER_SITE_OTHER): Payer: Medicare Other

## 2022-05-28 ENCOUNTER — Other Ambulatory Visit (INDEPENDENT_AMBULATORY_CARE_PROVIDER_SITE_OTHER): Payer: Medicare Other

## 2022-05-28 VITALS — BP 137/81 | HR 63 | Temp 97.9°F | Resp 16 | Ht 65.0 in | Wt 186.8 lb

## 2022-05-28 DIAGNOSIS — E034 Atrophy of thyroid (acquired): Secondary | ICD-10-CM

## 2022-05-28 DIAGNOSIS — E782 Mixed hyperlipidemia: Secondary | ICD-10-CM

## 2022-05-28 DIAGNOSIS — D699 Hemorrhagic condition, unspecified: Secondary | ICD-10-CM | POA: Diagnosis not present

## 2022-05-28 DIAGNOSIS — R7301 Impaired fasting glucose: Secondary | ICD-10-CM

## 2022-05-28 DIAGNOSIS — Z0000 Encounter for general adult medical examination without abnormal findings: Secondary | ICD-10-CM

## 2022-05-28 LAB — CBC WITH DIFFERENTIAL/PLATELET
Basophils Absolute: 0 10*3/uL (ref 0.0–0.1)
Basophils Relative: 0.5 % (ref 0.0–3.0)
Eosinophils Absolute: 0.1 10*3/uL (ref 0.0–0.7)
Eosinophils Relative: 1 % (ref 0.0–5.0)
HCT: 40.7 % (ref 36.0–46.0)
Hemoglobin: 13.5 g/dL (ref 12.0–15.0)
Lymphocytes Relative: 37.6 % (ref 12.0–46.0)
Lymphs Abs: 2.4 10*3/uL (ref 0.7–4.0)
MCHC: 33.2 g/dL (ref 30.0–36.0)
MCV: 94.9 fl (ref 78.0–100.0)
Monocytes Absolute: 0.7 10*3/uL (ref 0.1–1.0)
Monocytes Relative: 10.2 % (ref 3.0–12.0)
Neutro Abs: 3.2 10*3/uL (ref 1.4–7.7)
Neutrophils Relative %: 50.7 % (ref 43.0–77.0)
Platelets: 313 10*3/uL (ref 150.0–400.0)
RBC: 4.29 Mil/uL (ref 3.87–5.11)
RDW: 13.9 % (ref 11.5–15.5)
WBC: 6.4 10*3/uL (ref 4.0–10.5)

## 2022-05-28 LAB — COMPREHENSIVE METABOLIC PANEL
ALT: 25 U/L (ref 0–35)
AST: 23 U/L (ref 0–37)
Albumin: 4.5 g/dL (ref 3.5–5.2)
Alkaline Phosphatase: 66 U/L (ref 39–117)
BUN: 15 mg/dL (ref 6–23)
CO2: 29 mEq/L (ref 19–32)
Calcium: 9.7 mg/dL (ref 8.4–10.5)
Chloride: 97 mEq/L (ref 96–112)
Creatinine, Ser: 0.97 mg/dL (ref 0.40–1.20)
GFR: 58.78 mL/min — ABNORMAL LOW (ref >60.00)
Glucose, Bld: 96 mg/dL (ref 70–99)
Potassium: 4.4 mEq/L (ref 3.5–5.1)
Sodium: 134 mEq/L — ABNORMAL LOW (ref 135–145)
Total Bilirubin: 0.9 mg/dL (ref 0.2–1.2)
Total Protein: 7.3 g/dL (ref 6.0–8.3)

## 2022-05-28 LAB — TSH: TSH: 2.44 u[IU]/mL (ref 0.35–5.50)

## 2022-05-28 LAB — HEMOGLOBIN A1C: Hgb A1c MFr Bld: 6.5 % (ref 4.6–6.5)

## 2022-05-28 NOTE — Progress Notes (Cosign Needed Addendum)
Subjective:   Angela Mendoza is a 71 y.o. female who presents for Medicare Annual (Subsequent) preventive examination.  Review of Systems    No ROS.  Medicare Wellness    Cardiac Risk Factors include: advanced age (>71mn, >>63women);hypertension     Objective:    Today's Vitals   05/28/22 1110  BP: 137/81  Pulse: 63  Resp: 16  Temp: 97.9 F (36.6 C)  Weight: 186 lb 12.8 oz (84.7 kg)  Height: '5\' 5"'$  (1.651 m)   Body mass index is 31.09 kg/m.     05/28/2022   11:43 AM 05/27/2021    3:27 PM 12/29/2020    2:21 PM 05/23/2020   11:25 AM 05/23/2019   10:27 AM 05/20/2018   10:57 AM  Advanced Directives  Does Patient Have a Medical Advance Directive? No No No No No No  Would patient like information on creating a medical advance directive? No - Patient declined No - Patient declined No - Patient declined No - Patient declined Yes (MAU/Ambulatory/Procedural Areas - Information given) Yes (MAU/Ambulatory/Procedural Areas - Information given)    Current Medications (verified) Outpatient Encounter Medications as of 05/28/2022  Medication Sig   [DISCONTINUED] budesonide-formoterol (SYMBICORT) 160-4.5 MCG/ACT inhaler Inhale 2 puffs into the lungs in the morning and at bedtime.   albuterol (PROVENTIL HFA) 108 (90 Base) MCG/ACT inhaler Inhale 2 puffs into the lungs every 6 (six) hours as needed.   ALPRAZolam (XANAX) 0.25 MG tablet TAKE 1 TABLET (0.25 MG TOTAL) BY MOUTH ONCE DAILY AS NEEDED FOR SLEEP.   atorvastatin (LIPITOR) 40 MG tablet Take 1 tablet (40 mg total) by mouth daily.   azelastine (ASTELIN) 0.1 % nasal spray Place 2 sprays into both nostrils 2 (two) times daily. Use in each nostril as directed   chlorpheniramine (CHLOR-TRIMETON) 4 MG tablet Take 1 tablet (4 mg total) by mouth at bedtime.   Cholecalciferol (VITAMIN D3) 50 MCG (2000 UT) TABS Take 1 tablet by mouth daily.   Coenzyme Q10 (COQ10 PO) Take by mouth daily.   conjugated estrogens (PREMARIN) vaginal cream Place vaginally  2 (two) times a week. INSERT 0.5 APPLICATORS FULL VAGINALLY TWICE PER WEEK. AT BEDTIME.   cyanocobalamin (,VITAMIN B-12,) 1000 MCG/ML injection Inject 1 mL (1,000 mcg total) into the muscle every 30 (thirty) days.   cycloSPORINE (RESTASIS) 0.05 % ophthalmic emulsion Place 1 drop into both eyes 2 (two) times daily.   dextromethorphan-guaiFENesin (MUCINEX DM) 30-600 MG 12hr tablet Take 1 tablet by mouth 2 (two) times daily as needed for cough. (Patient not taking: Reported on 05/28/2022)   diazepam (VALIUM) 5 MG tablet Take 1 tablet (5 mg total) by mouth daily as needed for anxiety. (Patient taking differently: Take 5 mg by mouth daily as needed (vertigo).)   Docusate Calcium (STOOL SOFTENER PO) Take 1 tablet by mouth as needed.   EPINEPHrine 0.3 mg/0.3 mL IJ SOAJ injection EpiPen 2-Pak 0.3 mg/0.3 mL injection, auto-injector   0.3 mg by injection route.   estradiol (VIVELLE-DOT) 0.1 MG/24HR patch Place 1 patch (0.1 mg total) onto the skin 2 (two) times a week.   famotidine (PEPCID) 20 MG tablet Take 1 tablet (20 mg total) by mouth daily as needed for heartburn or indigestion.   fexofenadine (ALLEGRA ALLERGY) 180 MG tablet Take 1 tablet (180 mg total) by mouth daily.   fluconazole (DIFLUCAN) 150 MG tablet Take 1 tablet (150 mg total) by mouth daily. Can take additional tablet after 72 hours.   fluticasone (FLONASE) 50 MCG/ACT nasal spray  Place 1 spray into both nostrils daily.   levothyroxine (SYNTHROID) 50 MCG tablet Take 1 tablet (50 mcg total) by mouth daily before breakfast.   losartan (COZAAR) 50 MG tablet Take 1 tablet (50 mg total) by mouth at bedtime.   MAGNESIUM PO Take 250 mg by mouth daily.   meclizine (ANTIVERT) 25 MG tablet Take 1 tablet (25 mg total) by mouth 3 (three) times daily as needed. (Patient taking differently: Take 25 mg by mouth 3 (three) times daily as needed (vertigo).)   metoprolol succinate (TOPROL-XL) 50 MG 24 hr tablet Take 1 tablet (50 mg total) by mouth daily.    montelukast (SINGULAIR) 10 MG tablet Take 1 tablet (10 mg total) by mouth at bedtime.   Multiple Vitamin (MULTIVITAMIN) tablet Take 1 tablet by mouth daily.   mupirocin ointment (BACTROBAN) 2 % Apply topically.   nystatin (MYCOSTATIN) 100000 UNIT/ML suspension Once daily after use of steroid inhaler   ondansetron (ZOFRAN ODT) 4 MG disintegrating tablet Take 1 tablet (4 mg total) by mouth every 8 (eight) hours as needed for nausea or vomiting.   Propylene Glycol 0.6 % SOLN Apply 1 drop to eye as needed.   psyllium (METAMUCIL) 58.6 % powder Take 1 packet by mouth as needed.   Syringe/Needle, Disp, (SYRINGE 3CC/20GX1") 20G X 1" 3 ML MISC 1 Syringe by Does not apply route every 30 (thirty) days. For use with b-12 injection monthly   Tiotropium Bromide Monohydrate (SPIRIVA RESPIMAT) 1.25 MCG/ACT AERS Inhale 2 puffs into the lungs daily.   tretinoin (RETIN-A) 0.05 % cream APPLY TOPICALLY AT BEDTIME   urea (CARMOL) 10 % cream Apply topically as needed.   [DISCONTINUED] beclomethasone (QVAR) 80 MCG/ACT inhaler Inhale 2 puffs into the lungs 2 (two) times daily.   [DISCONTINUED] FLUoxetine (PROZAC) 10 MG tablet Take 1 tablet (10 mg total) by mouth daily. (Patient taking differently: Take 5 mg by mouth daily.)   No facility-administered encounter medications on file as of 05/28/2022.    Allergies (verified) Codeine, Qvar [beclomethasone], and Sulfonamide derivatives   History: Past Medical History:  Diagnosis Date   Agatston coronary artery calcium score less than 100    a. 06/2018 Cardiac CT: Ca2+ = 3 (49th %'ile).   Allergic rhinitis 06/09/2008   chronic   Aortic atherosclerosis ([place])    a. 06/2018 noted on chest CT.   Asthma, intrinsic 10/06/2011   controlled on qvar. [name] 09/2011:  Very mild airflow obstruction (FEV1% 68, FVL c/w obstruction)    Basal cell carcinoma 06/10/2021   right prox nasal ala rim - MOHs 07/12/21 Dr. Lacinda Axon   Chronic headache 06/09/2008   COPD (chronic obstructive  pulmonary disease) ([place])    Depression    Diverticulosis 2013   h/o diverticulitis   Dry eyes    GERD (gastroesophageal reflux disease)    History of breast implant removal    silicone mastitis - R axilla silicone LN, free silicone L breast upper outer quadrant   Hot flashes    HYPERLIPIDEMIA 06/09/2008   HYPERTENSION 06/09/2008   Hypothyroidism    MVP (mitral valve prolapse)    OSA on CPAP    [name]   PAF (paroxysmal atrial fibrillation) ([place]) 06/09/2008   a. CHA2DS2VASc = 3-->prefers ASA.   Pernicious anemia    per prior pcp   Rosacea    Surgical menopause 1991   Vertigo    Vitamin D deficiency    Past Surgical History:  Procedure Laterality Date   APPENDECTOMY  2007   BREAST  BIOPSY Left 09/27/2009   BREAST ENHANCEMENT SURGERY  2006   CARDIOVASCULAR STRESS TEST  2013   [place] [name] - WNL, EF 55-60%, with 0 calcium score   CHOLECYSTECTOMY  2007   biliary dyskinesia   COLONOSCOPY  08/2012   diverticulosis   COLONOSCOPY  2006   inflamed hyperplastic polyp   ESOPHAGOGASTRODUODENOSCOPY  08/2012   esophageal reflux   HERNIA REPAIR     NASAL SINUS SURGERY     RECTOCELE REPAIR  12/2008   [name]   sleep study  08/2007   OSA, 12 cm H2O,   TOTAL ABDOMINAL HYSTERECTOMY  1991   heavy bleeding, ovaries removed   TUBAL LIGATION     US ECHOCARDIOGRAPHY  09/2009   impaired relaxation, mild tric regurg, normal MV, EF 60%   US ECHOCARDIOGRAPHY  11/2007   mild-mod MR   Family History  Problem Relation Age of Onset   Cancer Mother 73       lung, passive smoke   Cancer Father 90       lung, smoker   COPD Sister    Stroke Sister        TIA   Hypertension Sister    Heart failure Sister    Aortic dissection Maternal Aunt    Breast cancer Maternal Aunt    Cancer Paternal Aunt        breast, lung   Aortic dissection Paternal Uncle    Heart attack Paternal Uncle    AAA (abdominal aortic aneurysm) Paternal Uncle    Heart disease Paternal Uncle    Stroke Paternal  Uncle    Cancer Cousin        breast   Social History   Socioeconomic History   Marital status: Married    Spouse name: Not on file   Number of children: Not on file   Years of education: Not on file   Highest education level: Not on file  Occupational History   Occupation: retired  Tobacco Use   Smoking status: Former    Packs/day: 0.30    Years: 7.00    Total pack years: 2.10    Types: Cigarettes    Quit date: 10/27/1977    Years since quitting: 44.6   Smokeless tobacco: Never   Tobacco comments:    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 Hx of children who are grown  Vaping Use   Vaping Use: Never used  Substance and Sexual Activity   Alcohol use: Not Currently    Alcohol/week: 0.0 standard drinks of alcohol   Drug use: No   Sexual activity: Not on file  Other Topics Concern   Not on file  Social History Narrative   Lives with husband    Grown children   Occ: retired, was Gaffer   Edu: trade degree then 2 yrscollege   Social Determinants of Health   Financial Resource Strain: [place]  (05/28/2022)   Overall Financial Resource Strain (CARDIA)    Difficulty of Paying Living Expenses: Not hard at all  Food Insecurity: No Food Insecurity (05/28/2022)   Hunger Vital Sign    Worried About Running Out of Food in the Last Year: Never true    [place] in the Last Year: Never true  Transportation Needs: No Transportation Needs (05/28/2022)   PRAPARE - Hydrologist (Medical): No    Lack of Transportation (Non-Medical): No  Physical Activity: Sufficiently Active (05/28/2022)   Exercise  Vital Sign    Days of Exercise per Week: 5 days    Minutes of Exercise per Session: 30 min  Stress: No Stress Concern Present (05/28/2022)   [place]    Feeling of Stress : Not at all  Social Connections: Unknown (05/28/2022)   Social Connection and Isolation Panel  [NHANES]    Frequency of Communication with Friends and Family: More than three times a week    Frequency of Social Gatherings with Friends and Family: More than three times a week    Attends Religious Services: Not on Advertising copywriter or Organizations: Not on file    Attends Archivist Meetings: Not on file    Marital Status: Married    Tobacco Counseling Counseling given: Not Answered Tobacco comments: 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 Hx of children who are grown   Clinical Intake:  Pre-visit preparation completed: Yes        Diabetes: No  How often do you need to have someone help you when you read instructions, pamphlets, or other written materials from your doctor or pharmacy?: 1 - Never  Interpreter Needed?: No      Activities of Daily Living    05/28/2022   11:18 AM  In your present state of health, do you have any difficulty performing the following activities:  Hearing? 0  Vision? 0  Difficulty concentrating or making decisions? 0  Walking or climbing stairs? 0  Dressing or bathing? 0  Doing errands, shopping? 0  Preparing Food and eating ? N  Using the Toilet? N  In the past six months, have you accidently leaked urine? N  Do you have problems with loss of bowel control? N  Managing your Medications? N  Managing your Finances? N  Housekeeping or managing your Housekeeping? N   Patient Care Team: Crecencio Mc, MD as PCP - General (Internal Medicine)  Indicate any recent Medical Services you may have received from other than [facility] providers in the past year (date may be approximate).     Assessment:   This is a routine wellness examination for [place]oya.  Hearing/Vision screen Hearing Screening - Comments:: Patient is able to hear conversational tones without difficulty. No issues reported. Vision Screening - Comments:: Followed by [facility], [place] Wears corrective lenses They have  seen their ophthalmologist in the last 12 months.    Dietary issues and exercise activities discussed: Current Exercise Habits: Home exercise routine, Type of exercise: calisthenics;stretching;strength training/weights (Recumbent bike), Time (Minutes): 30, Frequency (Times/Week): 5, Weekly Exercise (Minutes/Week): 150, Intensity: Mild Healthy diet Good water intake   Goals Addressed               This Visit's Progress     Patient Stated     Keto diet (pt-stated)        Weight goal 175lb Considering Mediterranean diet       Depression Screen    05/28/2022   11:35 AM 05/09/2022    4:49 PM 05/29/2021   10:31 AM 05/27/2021    3:26 PM 02/13/2021    2:15 PM 05/23/2020   11:20 AM 11/07/2019    8:43 AM  PHQ 2/9 Scores  PHQ - 2 Score 0 0 0 0 2 0 0  PHQ- 9 Score 0 3   4      Fall Risk    05/28/2022   11:42 AM 05/29/2021   10:30  AM 05/27/2021    3:29 PM 02/13/2021    2:07 PM 01/16/2021   10:55 AM  Fall Risk   Falls in the past year? 0 0 0 0 0  Number falls in past yr: 0 0     Injury with Fall? 0 0 0    Risk for fall due to :  No Fall Risks     Follow up Falls evaluation completed Falls evaluation completed Falls evaluation completed Falls evaluation completed Falls evaluation completed    [place]: Home free of loose throw rugs in walkways, pet beds, electrical cords, etc? Yes  Adequate lighting in your home to reduce risk of falls? Yes   ASSISTIVE DEVICES UTILIZED TO PREVENT FALLS: Life alert? No  Use of a cane, walker or w/c? No   TIMED UP AND GO: Was the test performed? Yes .  Length of time to ambulate 10 feet: 10 sec.   Gait steady and fast without use of assistive device  Cognitive Function: Patient is alert and oriented x3.  Enjoys reading and other brain stimulating activities.      05/23/2020   11:28 AM 05/20/2018   11:04 AM  MMSE - Mini Mental State Exam  Not completed: Unable to complete   Orientation to time  5   Orientation to Place  5  Registration  3  Attention/ Calculation  5  Recall  3  Language- name 2 objects  2  Language- repeat  1  Language- follow 3 step command  3  Language- read & follow direction  1  Write a sentence  1  Copy design  1  Total score  30        05/23/2019   10:31 AM  6CIT Screen  What Year? 0 points  What month? 0 points  What time? 0 points  Count back from 20 0 points  Months in reverse 0 points  Repeat phrase 0 points  Total Score 0 points    Immunizations Immunization History  Administered Date(s) Administered   Fluad Quad(high Dose 65+) 07/16/2019, 07/20/2020, 08/05/2021   Influenza Split 07/15/2013, 07/12/2015, 07/14/2016, 08/04/2017   Influenza Whole 07/27/2009, 07/28/2011, 07/21/2012   Influenza, High Dose Seasonal PF 07/22/2017, 07/01/2018   Influenza,inj,Quad PF,6+ Mos 08/03/2014, 07/12/2015, 07/14/2016   Influenza-Unspecified 07/15/2013, 08/03/2014, 07/12/2015, 07/14/2016, 08/04/2017   Moderna SARS-COV2 Booster Vaccination 08/17/2020, 02/01/2021   Moderna Sars-Covid-2 Vaccination 12/11/2019, 01/08/2020, 07/25/2021   Pneumococcal Conjugate-13 11/08/2014   Pneumococcal Polysaccharide-23 07/27/2009, 12/27/2015   Td 10/27/2008   Tdap 12/19/2013, 02/25/2017   Tetanus 10/27/2008   Zoster Recombinat (Shingrix) 04/27/2020, 11/26/2020   Zoster, Live 10/27/2009   Screening Tests Health Maintenance  Topic Date Due   INFLUENZA VACCINE  05/27/2022   COVID-19 Vaccine (4 - Booster for Moderna series) 06/13/2022 (Originally 09/19/2021)   COLONOSCOPY (Pts 45-58yr Insurance coverage will need to be confirmed)  09/02/2022   MAMMOGRAM  04/19/2023   TETANUS/TDAP  02/26/2027   Pneumonia Vaccine 71 Years old  Completed   DEXA SCAN  Completed   Hepatitis C Screening  Completed   Zoster Vaccines- Shingrix  Completed   HPV VACCINES  Aged Out   Health Maintenance Health Maintenance Due  Topic Date Due   INFLUENZA VACCINE  05/27/2022   Lung Cancer  Screening: (Low Dose CT Chest recommended if Age 71-80years, 30 pack-year currently smoking OR have quit w/in 15years.) does not qualify.   Vision Screening: Recommended annual ophthalmology exams for early detection of glaucoma  and other disorders of the eye.  Dental Screening: Recommended annual dental exams for proper oral hygiene  Community Resource Referral / Chronic Care Management: CRR required this visit?  No   CCM required this visit?  No      Plan:   Keep all routine maintenance appointments.   I have personally reviewed and noted the following in the patient's chart:   Medical and social history Use of alcohol, tobacco or illicit drugs  Current medications and supplements including opioid prescriptions.  Functional ability and status Nutritional status Physical activity Advanced directives List of other physicians Hospitalizations, surgeries, and ER visits in previous 12 months Vitals Screenings to include cognitive, depression, and falls Referrals and appointments  In addition, I have reviewed and discussed with patient certain preventive protocols, quality metrics, and best practice recommendations. A written personalized care plan for preventive services as well as general preventive health recommendations were provided to patient.     OBrien-Blaney, Marranda Arakelian L, LPN   01/31/961    I have reviewed the above information and agree with above.   Deborra Medina, MD

## 2022-05-28 NOTE — Patient Instructions (Addendum)
  Angela Mendoza , Thank you for taking time to come for your Medicare Wellness Visit. I appreciate your ongoing commitment to your health goals. Please review the following plan we discussed and let me know if I can assist you in the future.   These are the goals we discussed:  Goals       Patient Stated     Id like to increase physical activity (pt-stated)      Keto diet (pt-stated)      Weight goal 175lb Considering Mediterranean diet        This is a list of the screening recommended for you and due dates:  Health Maintenance  Topic Date Due   Flu Shot  05/27/2022   COVID-19 Vaccine (4 - Booster for Moderna series) 06/13/2022*   Colon Cancer Screening  09/02/2022   Mammogram  04/19/2023   Tetanus Vaccine  02/26/2027   Pneumonia Vaccine  Completed   DEXA scan (bone density measurement)  Completed   Hepatitis C Screening: USPSTF Recommendation to screen - Ages 98-79 yo.  Completed   Zoster (Shingles) Vaccine  Completed   HPV Vaccine  Aged Out  *Topic was postponed. The date shown is not the original due date.

## 2022-05-29 LAB — LIPID PANEL W/REFLEX DIRECT LDL
Cholesterol: 145 mg/dL (ref <200)
HDL: 64 mg/dL (ref >=50)
LDL Cholesterol (Calc): 63 mg/dL (calc)
Non-HDL Cholesterol (Calc): 81 mg/dL (calc) (ref <130)
Total CHOL/HDL Ratio: 2.3 (calc) (ref <5.0)
Triglycerides: 95 mg/dL (ref <150)

## 2022-05-30 ENCOUNTER — Encounter: Payer: Self-pay | Admitting: Internal Medicine

## 2022-05-30 ENCOUNTER — Ambulatory Visit (INDEPENDENT_AMBULATORY_CARE_PROVIDER_SITE_OTHER): Payer: Medicare Other | Admitting: Internal Medicine

## 2022-05-30 VITALS — BP 116/80 | HR 62 | Temp 98.4°F | Ht 65.0 in | Wt 188.4 lb

## 2022-05-30 DIAGNOSIS — M79604 Pain in right leg: Secondary | ICD-10-CM | POA: Diagnosis not present

## 2022-05-30 DIAGNOSIS — E034 Atrophy of thyroid (acquired): Secondary | ICD-10-CM

## 2022-05-30 DIAGNOSIS — G8929 Other chronic pain: Secondary | ICD-10-CM | POA: Diagnosis not present

## 2022-05-30 DIAGNOSIS — Z1211 Encounter for screening for malignant neoplasm of colon: Secondary | ICD-10-CM

## 2022-05-30 DIAGNOSIS — E559 Vitamin D deficiency, unspecified: Secondary | ICD-10-CM | POA: Diagnosis not present

## 2022-05-30 DIAGNOSIS — E785 Hyperlipidemia, unspecified: Secondary | ICD-10-CM

## 2022-05-30 DIAGNOSIS — E119 Type 2 diabetes mellitus without complications: Secondary | ICD-10-CM | POA: Diagnosis not present

## 2022-05-30 DIAGNOSIS — M79605 Pain in left leg: Secondary | ICD-10-CM | POA: Diagnosis not present

## 2022-05-30 DIAGNOSIS — E1169 Type 2 diabetes mellitus with other specified complication: Secondary | ICD-10-CM

## 2022-05-30 DIAGNOSIS — I10 Essential (primary) hypertension: Secondary | ICD-10-CM | POA: Diagnosis not present

## 2022-05-30 DIAGNOSIS — D692 Other nonthrombocytopenic purpura: Secondary | ICD-10-CM

## 2022-05-30 DIAGNOSIS — I480 Paroxysmal atrial fibrillation: Secondary | ICD-10-CM | POA: Diagnosis not present

## 2022-05-30 DIAGNOSIS — M79672 Pain in left foot: Secondary | ICD-10-CM

## 2022-05-30 DIAGNOSIS — G2581 Restless legs syndrome: Secondary | ICD-10-CM | POA: Diagnosis not present

## 2022-05-30 MED ORDER — BLOOD GLUCOSE TEST STRIPS 333 VI STRP: ORAL_STRIP | 0 refills | Status: DC

## 2022-05-30 MED ORDER — ONETOUCH ULTRASOFT LANCETS MISC: 12 refills | Status: DC

## 2022-05-30 NOTE — Progress Notes (Signed)
Patient ID: JAKYIA GACCIONE, female    DOB: 03/18/1951  Age: 71 y.o. MRN: [id number]  The patient is here for follow up and  management of other chronic and acute problems.   The risk factors are reflected in the social history.  The roster of all physicians providing medical care to patient - is listed in the Snapshot section of the chart.  Activities of daily living:  The patient is 100% independent in all ADLs: dressing, toileting, feeding as well as independent mobility  Home safety : The patient has smoke detectors in the home. They wear seatbelts.  There are no firearms at home. There is no violence in the home.   There is no risks for hepatitis, STDs or HIV. There is no   history of blood transfusion. They have no travel history to infectious disease endemic areas of the world.  The patient has seen their dentist in the last six month. They have seen their eye doctor in the last year. They admit to slight hearing difficulty with regard to whispered voices and some television programs.  They have deferred audiologic testing in the last year.  They do not  have excessive sun exposure. Discussed the need for sun protection: hats, long sleeves and use of sunscreen if there is significant sun exposure.   Diet: the importance of a healthy diet is discussed. They do have a healthy diet.  The benefits of regular aerobic exercise were discussed. She walks 4 times per week ,  20 minutes.   Depression screen: there are no signs or vegative symptoms of depression- irritability, change in appetite, anhedonia, sadness/tearfullness.  Cognitive assessment: the patient manages all their financial and personal affairs and is actively engaged. They could relate day,date,year and events; recalled 2/3 objects at 3 minutes; performed clock-face test normally.  The following portions of the patient's history were reviewed and updated as appropriate: allergies, current medications, past family history, past  medical history,  past surgical history, past social history  and problem list.  Visual acuity was not assessed per patient preference since she has regular follow up with her ophthalmologist. Hearing and body mass index were assessed and reviewed.   During the course of the visit the patient was educated and counseled about appropriate screening and preventive services including : fall prevention , diabetes screening, nutrition counseling, colorectal cancer screening, and recommended immunizations.    CC: The primary encounter diagnosis was Chronic heel pain, left. Diagnoses of Colon cancer screening, Essential hypertension, Controlled type 2 diabetes mellitus without complication, without long-term current use of insulin ([place]), Leg pain, bilateral, Restless legs, Vitamin D deficiency, Paroxysmal atrial fibrillation ([place]), Hyperlipidemia associated with type 2 diabetes mellitus ([place]), Purpura senilis ([place]), Hypothyroidism due to acquired atrophy of thyroid, and Essential hypertension, benign were also pertinent to this visit.  1) left heel pain with weight bearing.  No history of trauma.   2) slow heart rate with several episodes of presyncope  3)  History Maryiah has a past medical history of Agatston coronary artery calcium score less than 100, Allergic rhinitis (06/09/2008), Aortic atherosclerosis ([place]), Asthma, intrinsic (10/06/2011), Basal cell carcinoma (06/10/2021), Chronic headache (06/09/2008), COPD (chronic obstructive pulmonary disease) ([place]), Depression, Diverticulosis (2013), Dry eyes, GERD (gastroesophageal reflux disease), History of breast implant removal, Hot flashes, HYPERLIPIDEMIA (06/09/2008), HYPERTENSION (06/09/2008), Hypothyroidism, MVP (mitral valve prolapse), OSA on CPAP, PAF (paroxysmal atrial fibrillation) ([place]) (06/09/2008), Pernicious anemia, Rosacea, Surgical menopause (1991), Vertigo, and Vitamin D deficiency.   She has a past surgical  history that includes  Cholecystectomy (2007); Appendectomy (2007); Tubal ligation; Total abdominal hysterectomy (1991); Nasal sinus surgery; Breast enhancement surgery (2006); Cardiovascular stress test (2013); Colonoscopy (08/2012); Esophagogastroduodenoscopy (08/2012); US ECHOCARDIOGRAPHY (09/2009); US ECHOCARDIOGRAPHY (11/2007); sleep study (08/2007); Rectocele repair (12/2008); Colonoscopy (2006); Breast biopsy (Left, 09/27/2009); and Hernia repair.   Her family history includes AAA (abdominal aortic aneurysm) in her paternal uncle; Aortic dissection in her maternal aunt and paternal uncle; Breast cancer in her maternal aunt; COPD in her sister; Cancer in her cousin and paternal aunt; Cancer (age of onset: 59) in her father; Cancer (age of onset: 76) in her mother; Heart attack in her paternal uncle; Heart disease in her paternal uncle; Heart failure in her sister; Hypertension in her sister; Stroke in her paternal uncle and sister.She reports that she quit smoking about 44 years ago. Her smoking use included cigarettes. She has a 2.10 pack-year smoking history. She has never used smokeless tobacco. She reports that she does not currently use alcohol. She reports that she does not use drugs.  Outpatient Medications Prior to Visit  Medication Sig Dispense Refill   albuterol (PROVENTIL HFA) 108 (90 Base) MCG/ACT inhaler Inhale 2 puffs into the lungs every 6 (six) hours as needed. 18 g 2   ALPRAZolam (XANAX) 0.25 MG tablet TAKE 1 TABLET (0.25 MG TOTAL) BY MOUTH ONCE DAILY AS NEEDED FOR SLEEP. 30 tablet 5   atorvastatin (LIPITOR) 40 MG tablet Take 1 tablet (40 mg total) by mouth daily. 90 tablet 3   azelastine (ASTELIN) 0.1 % nasal spray Place 2 sprays into both nostrils 2 (two) times daily. Use in each nostril as directed 90 mL 3   chlorpheniramine (CHLOR-TRIMETON) 4 MG tablet Take 1 tablet (4 mg total) by mouth at bedtime. 90 tablet 3   Cholecalciferol (VITAMIN D3) 50 MCG (2000 UT) TABS Take 1 tablet by mouth daily. 90 tablet  3   Coenzyme Q10 (COQ10 PO) Take by mouth daily.     conjugated estrogens (PREMARIN) vaginal cream Place vaginally 2 (two) times a week. INSERT 0.5 APPLICATORS FULL VAGINALLY TWICE PER WEEK. AT BEDTIME. 30 g 4   cyanocobalamin (,VITAMIN B-12,) 1000 MCG/ML injection Inject 1 mL (1,000 mcg total) into the muscle every 30 (thirty) days. 3 mL 3   cycloSPORINE (RESTASIS) 0.05 % ophthalmic emulsion Place 1 drop into both eyes 2 (two) times daily. 3 each 3   diazepam (VALIUM) 5 MG tablet Take 1 tablet (5 mg total) by mouth daily as needed for anxiety. (Patient taking differently: Take 5 mg by mouth daily as needed (vertigo).) 30 tablet 2   Docusate Calcium (STOOL SOFTENER PO) Take 1 tablet by mouth as needed.     EPINEPHrine 0.3 mg/0.3 mL IJ SOAJ injection EpiPen 2-Pak 0.3 mg/0.3 mL injection, auto-injector   0.3 mg by injection route.     estradiol (VIVELLE-DOT) 0.1 MG/24HR patch Place 1 patch (0.1 mg total) onto the skin 2 (two) times a week. 24 patch 3   famotidine (PEPCID) 20 MG tablet Take 1 tablet (20 mg total) by mouth daily as needed for heartburn or indigestion. 90 tablet 3   fexofenadine (ALLEGRA ALLERGY) 180 MG tablet Take 1 tablet (180 mg total) by mouth daily. 90 tablet 3   fluconazole (DIFLUCAN) 150 MG tablet Take 1 tablet (150 mg total) by mouth daily. Can take additional tablet after 72 hours. 2 tablet 0   fluticasone (FLONASE) 50 MCG/ACT nasal spray Place 1 spray into both nostrils daily. 48 g 3   levothyroxine (  SYNTHROID) 50 MCG tablet Take 1 tablet (50 mcg total) by mouth daily before breakfast. 90 tablet 3   losartan (COZAAR) 50 MG tablet Take 1 tablet (50 mg total) by mouth at bedtime. 90 tablet 3   MAGNESIUM PO Take 250 mg by mouth daily.     meclizine (ANTIVERT) 25 MG tablet Take 1 tablet (25 mg total) by mouth 3 (three) times daily as needed. (Patient taking differently: Take 25 mg by mouth 3 (three) times daily as needed (vertigo).) 270 tablet 3   metoprolol succinate (TOPROL-XL)  50 MG 24 hr tablet Take 1 tablet (50 mg total) by mouth daily. 90 tablet 3   montelukast (SINGULAIR) 10 MG tablet Take 1 tablet (10 mg total) by mouth at bedtime. 90 tablet 3   Multiple Vitamin (MULTIVITAMIN) tablet Take 1 tablet by mouth daily.     mupirocin ointment (BACTROBAN) 2 % Apply topically.     ondansetron (ZOFRAN ODT) 4 MG disintegrating tablet Take 1 tablet (4 mg total) by mouth every 8 (eight) hours as needed for nausea or vomiting. 30 tablet 2   Propylene Glycol 0.6 % SOLN Apply 1 drop to eye as needed.     psyllium (METAMUCIL) 58.6 % powder Take 1 packet by mouth as needed.     Syringe/Needle, Disp, (SYRINGE 3CC/20GX1") 20G X 1" 3 ML MISC 1 Syringe by Does not apply route every 30 (thirty) days. For use with b-12 injection monthly 50 each 0   Tiotropium Bromide Monohydrate (SPIRIVA RESPIMAT) 1.25 MCG/ACT AERS Inhale 2 puffs into the lungs daily. 4 g 0   tretinoin (RETIN-A) 0.05 % cream APPLY TOPICALLY AT BEDTIME 45 g 1   urea (CARMOL) 10 % cream Apply topically as needed. 71 g 3   dextromethorphan-guaiFENesin (MUCINEX DM) 30-600 MG 12hr tablet Take 1 tablet by mouth 2 (two) times daily as needed for cough. (Patient not taking: Reported on 05/28/2022) 60 tablet 0   nystatin (MYCOSTATIN) 100000 UNIT/ML suspension Once daily after use of steroid inhaler (Patient not taking: Reported on 05/30/2022) 150 mL 0   No facility-administered medications prior to visit.    Review of Systems  Patient denies headache, fevers, malaise, unintentional weight loss, skin rash, eye pain, sinus congestion and sinus pain, sore throat, dysphagia,  hemoptysis , cough, dyspnea, wheezing, chest pain, palpitations, orthopnea, edema, abdominal pain, nausea, melena, diarrhea, constipation, flank pain, dysuria, hematuria, urinary  Frequency, nocturia, numbness, tingling, seizures,  Focal weakness, Loss of consciousness,  Tremor, insomnia, depression, anxiety, and suicidal ideation.     Objective:  BP 116/80 (BP  Location: Left Arm, Patient Position: Sitting, Cuff Size: Normal)   Pulse 62   Temp 98.4 F (36.9 C) (Oral)   Ht '5\' 5"'$  (1.651 m)   Wt 188 lb 6.4 oz (85.5 kg)   SpO2 98%   BMI 31.35 kg/m   Physical Exam  General appearance: alert, cooperative and appears stated age Head: Normocephalic, without obvious abnormality, atraumatic Eyes: conjunctivae/corneas clear. PERRL, EOM's intact. Fundi benign. Ears: normal TM's and external ear canals both ears Nose: Nares normal. Septum midline. Mucosa normal. No drainage or sinus tenderness. Throat: lips, mucosa, and tongue normal; teeth and gums normal Neck: no adenopathy, no carotid bruit, no JVD, supple, symmetrical, trachea midline and thyroid not enlarged, symmetric, no tenderness/mass/nodules Lungs: clear to auscultation bilaterally Breasts: normal appearance, no masses or tenderness Heart: regular rate and rhythm, S1, S2 normal, no murmur, click, rub or gallop Abdomen: soft, non-tender; bowel sounds normal; no masses,  no organomegaly  Extremities: extremities normal, atraumatic, no cyanosis or edema Pulses: 2+ and symmetric Skin: multiple ecchymoses Skin color, texture, turgor normal. No rashes or lesions Neurologic: Alert and oriented X 3, normal strength and tone. Normal symmetric reflexes. Normal coordination and gait.      Assessment & Plan:   Problem List Items Addressed This Visit     Purpura senilis ([place])    She has purpura on both arms.  She uses  fish oil and asa.   Lab Results  Component Value Date   WBC 5.9 05/30/2022   HGB 13.3 05/30/2022   HCT 38.1 05/30/2022   MCV 92.0 05/30/2022   PLT 319 05/30/2022         Paroxysmal atrial fibrillation ([place])    Now with symptomatic bradycardia.  Advised to reduce metoprolol dose to 25 mg daily      Hypothyroidism    Thyroid function is WNL on levothyroxine 50 mcg daily .  No current changes needed.   Lab Results  Component Value Date   TSH 2.44 05/28/2022          Hyperlipidemia associated with type 2 diabetes mellitus ([place])    New diagnosis of DM with a1c 6.5  .  Discussed diagnosis, standards of care.  rx for glucometer .  Continue statin .  She has no proteinuria.   Lab Results  Component Value Date   HGBA1C 5.9 (H) 05/30/2022   Lab Results  Component Value Date   MICROALBUR <0.2 05/30/2022           Essential hypertension, benign    Well controlled on current regimen. Renal function stable, no changes today.      Controlled type 2 diabetes mellitus without complication, without long-term current use of insulin ([facility])   Relevant Orders   Lipid Panel w/reflex Direct LDL (Completed)   Hemoglobin A1c (Completed)   COMPLETE METABOLIC PANEL WITH GFR (Completed)   Chronic heel pain, left - Primary    Suspect bone spur on heel.  Referring to podiatry       Relevant Orders   Ambulatory referral to [facility] G[facility] (Completed)   Other Visit Diagnoses     Colon cancer screening       Relevant Orders   Ambulatory referral to Gastroenterology   COMPLETE METABOLIC PANEL WITH GFR (Completed)   Essential hypertension       Relevant Orders   COMPLETE METABOLIC PANEL WITH GFR (Completed)   Microalbumin / creatinine urine ratio (Completed)   Leg pain, bilateral       Relevant Orders   COMPLETE METABOLIC PANEL WITH GFR (Completed)   Restless legs       Relevant Orders   Iron, TIBC and Ferritin Panel (Completed)   COMPLETE METABOLIC PANEL WITH GFR (Completed)   CBC with Differential/Platelet (Completed)   Vitamin D deficiency       Relevant Orders   COMPLETE METABOLIC PANEL WITH GFR (Completed)   VITAMIN D 25 Hydroxy (Vit-D Deficiency, Fractures) (Completed)       I have discontinued Joaquim Lai A. [place]'s dextromethorphan-guaiFENesin and nystatin. I am also having her start on onetouch ultrasoft. Additionally, I am having her maintain her Propylene Glycol, psyllium, Docusate Calcium (STOOL SOFTENER PO),  fexofenadine, multivitamin, MAGNESIUM PO, cycloSPORINE, ondansetron, urea, EPINEPHrine, Coenzyme Q10 (COQ10 PO), albuterol, atorvastatin, Vitamin D3, cyanocobalamin, estradiol, famotidine, levothyroxine, losartan, meclizine, metoprolol succinate, montelukast, SYRINGE 3CC/20GX1", ALPRAZolam, diazepam, tretinoin, mupirocin ointment, azelastine, fluticasone, chlorpheniramine, Premarin, fluconazole, Spiriva Respimat, and Blood Glucose Test  Strips 333.  Meds ordered this encounter  Medications   DISCONTD: Glucose Blood (BLOOD GLUCOSE TEST STRIPS 333) STRP    Sig: FOR ONE TOUCH METER    NEW ONSET TYPE 2 DIABETES    Dispense:  100 strip    Refill:  0   Glucose Blood (BLOOD GLUCOSE TEST STRIPS 333) STRP    Sig: FOR ONE TOUCH METER    NEW ONSET TYPE 2 DIABETES    Dispense:  25 strip    Refill:  0   Lancets (ONETOUCH ULTRASOFT) lancets    Sig: Use as instructed    Dispense:  100 each    Refill:  12     I spent 30 minutes dedicated to the care of this patient on the date of this encounter to include pre-visit review of her medical history,  most recent imaging studies  and labs , Face-to-face time with the patient discussing her new diagnosis of diabetes , and post visit ordering of testing and therapeutics.     Medications Discontinued During This Encounter  Medication Reason   dextromethorphan-guaiFENesin (MUCINEX DM) 30-600 MG 12hr tablet    nystatin (MYCOSTATIN) 100000 UNIT/ML suspension    Glucose Blood (BLOOD GLUCOSE TEST STRIPS 333) STRP     Follow-up: Return in about 2 weeks (around 06/13/2022).   Crecencio Mc, MD

## 2022-05-30 NOTE — Patient Instructions (Signed)
Referral to dr Tarri Glenn has been done for your colonoscopy  your fasting  glucose is NOT elevated but your a1c of 6.5 is   diagnostic of diabetes; You do not need medications at this time to control your diabetes., but this condition does raise your lifetime risk for heart disease, and other complications  so you should continue taking Lipitor and losartan   You may reduce your metoprolol dose to 12/ tablet daily if you continue to feel weak and have a low pulse.    Please  schedule a return visit in 3 months  ,  labs prior to visit

## 2022-05-31 LAB — CBC WITH DIFFERENTIAL/PLATELET
Absolute Monocytes: 578 cells/uL (ref 200–950)
Basophils Absolute: 18 cells/uL (ref 0–200)
Basophils Relative: 0.3 %
Eosinophils Absolute: 77 cells/uL (ref 15–500)
Eosinophils Relative: 1.3 %
HCT: 38.1 % (ref 35.0–45.0)
Hemoglobin: 13.3 g/dL (ref 11.7–15.5)
Lymphs Abs: 2336 cells/uL (ref 850–3900)
MCH: 32.1 pg (ref 27.0–33.0)
MCHC: 34.9 g/dL (ref 32.0–36.0)
MCV: 92 fL (ref 80.0–100.0)
MPV: 9.8 fL (ref 7.5–12.5)
Monocytes Relative: 9.8 %
Neutro Abs: 2891 cells/uL (ref 1500–7800)
Neutrophils Relative %: 49 %
Platelets: 319 10*3/uL (ref 140–400)
RBC: 4.14 10*6/uL (ref 3.80–5.10)
RDW: 12.5 % (ref 11.0–15.0)
Total Lymphocyte: 39.6 %
WBC: 5.9 10*3/uL (ref 3.8–10.8)

## 2022-05-31 LAB — MICROALBUMIN / CREATININE URINE RATIO
Creatinine, Urine: 61 mg/dL (ref 20–275)
Microalb, Ur: <0.2 mg/dL

## 2022-05-31 LAB — LIPID PANEL W/REFLEX DIRECT LDL
Cholesterol: 142 mg/dL (ref <200)
HDL: 57 mg/dL (ref >=50)
LDL Cholesterol (Calc): 66 mg/dL (calc)
Non-HDL Cholesterol (Calc): 85 mg/dL (calc) (ref <130)
Total CHOL/HDL Ratio: 2.5 (calc) (ref <5.0)
Triglycerides: 100 mg/dL (ref <150)

## 2022-05-31 LAB — HEMOGLOBIN A1C
Hgb A1c MFr Bld: 5.9 % of total Hgb — ABNORMAL HIGH (ref <5.7)
Mean Plasma Glucose: 123 mg/dL
eAG (mmol/L): 6.8 mmol/L

## 2022-05-31 LAB — COMPLETE METABOLIC PANEL WITH GFR
AG Ratio: 1.7 (calc) (ref 1.0–2.5)
ALT: 25 U/L (ref 6–29)
AST: 26 U/L (ref 10–35)
Albumin: 4.4 g/dL (ref 3.6–5.1)
Alkaline phosphatase (APISO): 63 U/L (ref 37–153)
BUN: 9 mg/dL (ref 7–25)
CO2: 27 mmol/L (ref 20–32)
Calcium: 9.3 mg/dL (ref 8.6–10.4)
Chloride: 100 mmol/L (ref 98–110)
Creat: 0.97 mg/dL (ref 0.60–1.00)
Globulin: 2.6 g/dL (calc) (ref 1.9–3.7)
Glucose, Bld: 90 mg/dL (ref 65–99)
Potassium: 4.8 mmol/L (ref 3.5–5.3)
Sodium: 136 mmol/L (ref 135–146)
Total Bilirubin: 1.2 mg/dL (ref 0.2–1.2)
Total Protein: 7 g/dL (ref 6.1–8.1)
eGFR: 62 mL/min/{1.73_m2} (ref >=60)

## 2022-05-31 LAB — IRON,TIBC AND FERRITIN PANEL
%SAT: 28 % (calc) (ref 16–45)
Ferritin: 89 ng/mL (ref 16–288)
Iron: 117 ug/dL (ref 45–160)
TIBC: 416 mcg/dL (calc) (ref 250–450)

## 2022-05-31 LAB — VITAMIN D 25 HYDROXY (VIT D DEFICIENCY, FRACTURES): Vit D, 25-Hydroxy: 38 ng/mL (ref 30–100)

## 2022-06-01 DIAGNOSIS — M722 Plantar fascial fibromatosis: Secondary | ICD-10-CM | POA: Insufficient documentation

## 2022-06-01 DIAGNOSIS — G8929 Other chronic pain: Secondary | ICD-10-CM | POA: Insufficient documentation

## 2022-06-01 DIAGNOSIS — E1169 Type 2 diabetes mellitus with other specified complication: Secondary | ICD-10-CM | POA: Insufficient documentation

## 2022-06-01 NOTE — Assessment & Plan Note (Signed)
Well controlled on current regimen. Renal function stable, no changes today. 

## 2022-06-01 NOTE — Assessment & Plan Note (Signed)
Suspect bone spur on heel.  Referring to podiatry

## 2022-06-01 NOTE — Assessment & Plan Note (Signed)
New diagnosis of DM with a1c 6.5  .  Discussed diagnosis, standards of care.  rx for glucometer .  Continue statin .  She has no proteinuria.   Lab Results  Component Value Date   HGBA1C 5.9 (H) 05/30/2022   Lab Results  Component Value Date   MICROALBUR <0.2 05/30/2022

## 2022-06-01 NOTE — Assessment & Plan Note (Addendum)
She has purpura on both arms.  She uses  fish oil and asa.   Lab Results  Component Value Date   WBC 5.9 05/30/2022   HGB 13.3 05/30/2022   HCT 38.1 05/30/2022   MCV 92.0 05/30/2022   PLT 319 05/30/2022

## 2022-06-01 NOTE — Assessment & Plan Note (Signed)
Thyroid function is WNL on levothyroxine 50 mcg daily .  No current changes needed.   Lab Results  Component Value Date   TSH 2.44 05/28/2022

## 2022-06-01 NOTE — Assessment & Plan Note (Signed)
Now with symptomatic bradycardia.  Advised to reduce metoprolol dose to 25 mg daily

## 2022-06-04 ENCOUNTER — Telehealth: Payer: Self-pay

## 2022-06-04 ENCOUNTER — Telehealth: Payer: Self-pay | Admitting: Nurse Practitioner

## 2022-06-04 DIAGNOSIS — J019 Acute sinusitis, unspecified: Secondary | ICD-10-CM

## 2022-06-04 MED ORDER — AZELASTINE HCL 0.1 % NA SOLN: 2.0000 | Freq: Two times a day (BID) | NASAL | 3 refills | Status: DC

## 2022-06-04 MED ORDER — FLUTICASONE PROPIONATE 50 MCG/ACT NA SUSP: 1.0000 | Freq: Every day | NASAL | 3 refills | Status: DC

## 2022-06-04 MED ORDER — SPIRIVA RESPIMAT 1.25 MCG/ACT IN AERS: 2.0000 | INHALATION_SPRAY | Freq: Every day | RESPIRATORY_TRACT | 3 refills | Status: DC

## 2022-06-04 NOTE — Telephone Encounter (Signed)
Medications refilled. ATC patient. Nothing further needed.

## 2022-06-04 NOTE — Telephone Encounter (Signed)
Patient states she would like to have a 90-day supply of all the medications listed.  Also, patient states she is supposed to follow-up with Dr. Deborra Medina on 06/16/2022, but she would like to know if we need to reschedule her appointment since she has not been able to check her blood sugar yet.

## 2022-06-04 NOTE — Telephone Encounter (Signed)
Patient states the meter we gave her to test her blood does not match the lancets and test strips we called in for her.  Patient states she would like to get a new machine that will go with the lancets and test strips she has picked up from her pharmacy.  Patient states she would like for [place] to send a prescription to [facility] in [place] for an Ultra Meter.  Patient states she needs refills on the following medications:  The following are for Meds by [facility]:  albuterol (PROVENTIL HFA)  atorvastatin (LIPITOR) 40 MG tablet conjugated estrogens (PREMARIN)  cyanocobalamin (,VITAMIN B-12,) cycloSPORINE (RESTASIS) 0.05 %  EPINEPHrine 0.3 mg/0.3 mL IJ SOAJ  estradiol (VIVELLE-DOT) 0.1 MG/24HR  famotidine (PEPCID) 20 MG tablet Glucose Blood (BLOOD GLUCOSE TEST STRIPS 333) STRP (Patient states these should be One Touch Ultra) Lancets (ONETOUCH ULTRASOFT)  levothyroxine (SYNTHROID) 50 MCG tablet losartan (COZAAR) 50 MG tablet meclizine (ANTIVERT) 25 MG tablet metoprolol succinate (TOPROL-XL) montelukast (SINGULAIR) 10 MG tablet ondansetron (ZOFRAN ODT)  Refill following at [facility]: ALPRAZolam (XANAX) 0.25 MG tablet diazepam (VALIUM) 5 MG tablet tretinoin (RETIN-A) 0.05 % cream urea (CARMOL) 10 % cream

## 2022-06-05 ENCOUNTER — Other Ambulatory Visit: Payer: Self-pay

## 2022-06-05 DIAGNOSIS — K2100 Gastro-esophageal reflux disease with esophagitis, without bleeding: Secondary | ICD-10-CM

## 2022-06-05 DIAGNOSIS — I10 Essential (primary) hypertension: Secondary | ICD-10-CM

## 2022-06-05 MED ORDER — LEVOTHYROXINE SODIUM 50 MCG PO TABS: 50.0000 ug | ORAL_TABLET | Freq: Every day | ORAL | 3 refills | Status: DC

## 2022-06-05 MED ORDER — ONDANSETRON 4 MG PO TBDP: 4.0000 mg | ORAL_TABLET | Freq: Three times a day (TID) | ORAL | 2 refills | Status: DC | PRN

## 2022-06-05 MED ORDER — ESTRADIOL 0.1 MG/24HR TD PTTW: 1.0000 | MEDICATED_PATCH | TRANSDERMAL | 3 refills | Status: DC

## 2022-06-05 MED ORDER — TRETINOIN 0.05 % EX CREA: TOPICAL_CREAM | Freq: Every day | CUTANEOUS | 1 refills | Status: AC

## 2022-06-05 MED ORDER — LOSARTAN POTASSIUM 50 MG PO TABS: 50.0000 mg | ORAL_TABLET | Freq: Every day | ORAL | 3 refills | Status: DC

## 2022-06-05 MED ORDER — CYANOCOBALAMIN 1000 MCG/ML IJ SOLN: 1000.0000 ug | INTRAMUSCULAR | 3 refills | Status: DC

## 2022-06-05 MED ORDER — GLUCOSE BLOOD VI STRP: ORAL_STRIP | 12 refills | Status: DC

## 2022-06-05 MED ORDER — ATORVASTATIN CALCIUM 40 MG PO TABS: 40.0000 mg | ORAL_TABLET | Freq: Every day | ORAL | 3 refills | Status: DC

## 2022-06-05 MED ORDER — EPINEPHRINE 0.3 MG/0.3ML IJ SOAJ: INTRAMUSCULAR | 1 refills | Status: DC

## 2022-06-05 MED ORDER — METOPROLOL SUCCINATE ER 50 MG PO TB24: 50.0000 mg | ORAL_TABLET | Freq: Every day | ORAL | 3 refills | Status: DC

## 2022-06-05 MED ORDER — ONETOUCH ULTRASOFT LANCETS MISC: 12 refills | Status: DC

## 2022-06-05 MED ORDER — MONTELUKAST SODIUM 10 MG PO TABS: 10.0000 mg | ORAL_TABLET | Freq: Every day | ORAL | 3 refills | Status: DC

## 2022-06-05 MED ORDER — MECLIZINE HCL 25 MG PO TABS: 25.0000 mg | ORAL_TABLET | Freq: Three times a day (TID) | ORAL | 3 refills | Status: DC | PRN

## 2022-06-05 MED ORDER — UREA 10 % EX CREA: TOPICAL_CREAM | CUTANEOUS | 3 refills | Status: DC | PRN

## 2022-06-05 MED ORDER — FAMOTIDINE 20 MG PO TABS: 20.0000 mg | ORAL_TABLET | Freq: Every day | ORAL | 3 refills | Status: DC | PRN

## 2022-06-05 MED ORDER — ALBUTEROL SULFATE HFA 108 (90 BASE) MCG/ACT IN AERS: 2.0000 | INHALATION_SPRAY | Freq: Four times a day (QID) | RESPIRATORY_TRACT | 2 refills | Status: DC | PRN

## 2022-06-05 MED ORDER — PREMARIN 0.625 MG/GM VA CREA: TOPICAL_CREAM | VAGINAL | 4 refills | Status: DC

## 2022-06-05 NOTE — Telephone Encounter (Signed)
Not sure of you would prefer patient to wait on her appointment scheduled for 8/21 since unable to check blood sugars?  I have refilled meds to requested pharmacies as asked besides controls which I have sent to you.

## 2022-06-06 ENCOUNTER — Ambulatory Visit (INDEPENDENT_AMBULATORY_CARE_PROVIDER_SITE_OTHER): Payer: Medicare Other | Admitting: Podiatry

## 2022-06-06 ENCOUNTER — Encounter: Payer: Self-pay | Admitting: Gastroenterology

## 2022-06-06 ENCOUNTER — Ambulatory Visit (INDEPENDENT_AMBULATORY_CARE_PROVIDER_SITE_OTHER): Payer: Medicare Other

## 2022-06-06 DIAGNOSIS — M722 Plantar fascial fibromatosis: Secondary | ICD-10-CM

## 2022-06-06 MED ORDER — METHYLPREDNISOLONE 4 MG PO TBPK: ORAL_TABLET | ORAL | 0 refills | Status: DC

## 2022-06-06 MED ORDER — MELOXICAM 15 MG PO TABS: 15.0000 mg | ORAL_TABLET | Freq: Every day | ORAL | 1 refills | Status: DC

## 2022-06-06 MED ORDER — BETAMETHASONE SOD PHOS & ACET 6 (3-3) MG/ML IJ SUSP
3.0000 mg | Freq: Once | INTRAMUSCULAR | Status: AC
  Administered 2022-06-06: 3 mg via INTRA_ARTICULAR

## 2022-06-06 NOTE — Progress Notes (Signed)
Chief Complaint  Patient presents with   Foot Pain    Chronic left heel pain    Subjective: 71 y.o. female presenting to the office today for evaluation of left heel pain has been going on for few months now.  Denies a history of injury.  Gradual onset.  She has not really done anything for treatment.  She presents for further treatment and evaluation   Past Medical History:  Diagnosis Date   Agatston coronary artery calcium score less than 100    a. 06/2018 Cardiac CT: Ca2+ = 3 (49th %'ile).   Allergic rhinitis 06/09/2008   chronic   Aortic atherosclerosis ([place])    a. 06/2018 noted on chest CT.   Asthma, intrinsic 10/06/2011   controlled on qvar. [name] 09/2011:  Very mild airflow obstruction (FEV1% 68, FVL c/w obstruction)    Basal cell carcinoma 06/10/2021   right prox nasal ala rim - MOHs 07/12/21 Dr. Lacinda Axon   Chronic headache 06/09/2008   COPD (chronic obstructive pulmonary disease) ([place])    Depression    Diverticulosis 2013   h/o diverticulitis   Dry eyes    GERD (gastroesophageal reflux disease)    History of breast implant removal    silicone mastitis - R axilla silicone LN, free silicone L breast upper outer quadrant   Hot flashes    HYPERLIPIDEMIA 06/09/2008   HYPERTENSION 06/09/2008   Hypothyroidism    MVP (mitral valve prolapse)    OSA on CPAP    [name]   PAF (paroxysmal atrial fibrillation) ([place]) 06/09/2008   a. CHA2DS2VASc = 3-->prefers ASA.   Pernicious anemia    per prior pcp   Rosacea    Surgical menopause 1991   Vertigo    Vitamin D deficiency      Objective: Physical Exam General: The patient is alert and oriented x3 in no acute distress.  Dermatology: Skin is warm, dry and supple bilateral lower extremities. Negative for open lesions or macerations bilateral.   Vascular: Dorsalis Pedis and Posterior Tibial pulses palpable bilateral.  Capillary fill time is immediate to all digits.  Neurological: Epicritic and protective threshold intact  bilateral.   Musculoskeletal: Tenderness to palpation to the plantar aspect of the left heel along the plantar fascia. All other joints range of motion within normal limits bilateral. Strength 5/5 in all groups bilateral.   Radiographic exam: Normal osseous mineralization. Joint spaces preserved. No fracture/dislocation/boney destruction. No other soft tissue abnormalities or radiopaque foreign bodies.   Assessment: 1. Plantar fasciitis left foot  Plan of Care:  1. Patient evaluated. Xrays reviewed.   2. Injection of 0.5cc Celestone soluspan injected into the left plantar fascia.  3. Rx for Medrol Dose Pak placed 4. Rx for Meloxicam ordered for patient. 5. Plantar fascial band(s) dispensed  6. Instructed patient regarding therapies and modalities at home to alleviate symptoms.  Advised against going barefoot.  Patient states that she goes barefoot throughout the day 7.  Patient is also inquiring about orthotics from [facility].  Prescription for custom molded orthotics provided for the patient to take to [facility]  8.  Return to clinic in 4 weeks.     Edrick Kins, DPM [facility]  Dr. Edrick Kins, DPM    [address]  [place], [place] [address]                Office [phone]  Fax [phone]

## 2022-06-06 NOTE — Telephone Encounter (Signed)
Thank you .  You can mover her appt out 2 weeks.  Please send in the matcing lancets/test strips for her mete as requested

## 2022-06-09 ENCOUNTER — Other Ambulatory Visit: Payer: Self-pay

## 2022-06-09 MED ORDER — ONETOUCH ULTRA 2 W/DEVICE KIT: PACK | 0 refills | Status: DC

## 2022-06-09 NOTE — Telephone Encounter (Signed)
Patient wants diazepam refilled.

## 2022-06-10 ENCOUNTER — Ambulatory Visit (INDEPENDENT_AMBULATORY_CARE_PROVIDER_SITE_OTHER): Payer: Medicare Other | Admitting: Primary Care

## 2022-06-10 ENCOUNTER — Other Ambulatory Visit: Payer: Self-pay | Admitting: Primary Care

## 2022-06-10 ENCOUNTER — Ambulatory Visit (INDEPENDENT_AMBULATORY_CARE_PROVIDER_SITE_OTHER): Payer: Medicare Other

## 2022-06-10 VITALS — BP 142/68 | HR 65 | Temp 98.7°F | Ht 65.0 in | Wt 188.0 lb

## 2022-06-10 DIAGNOSIS — J45909 Unspecified asthma, uncomplicated: Secondary | ICD-10-CM

## 2022-06-10 DIAGNOSIS — J4551 Severe persistent asthma with (acute) exacerbation: Secondary | ICD-10-CM | POA: Diagnosis not present

## 2022-06-10 DIAGNOSIS — R0602 Shortness of breath: Secondary | ICD-10-CM

## 2022-06-10 DIAGNOSIS — J40 Bronchitis, not specified as acute or chronic: Secondary | ICD-10-CM | POA: Diagnosis not present

## 2022-06-10 DIAGNOSIS — J441 Chronic obstructive pulmonary disease with (acute) exacerbation: Secondary | ICD-10-CM

## 2022-06-10 LAB — CBC WITH DIFFERENTIAL/PLATELET
Basophils Absolute: 0 10*3/uL (ref 0.0–0.1)
Basophils Relative: 0.4 % (ref 0.0–3.0)
Eosinophils Absolute: 0.2 10*3/uL (ref 0.0–0.7)
Eosinophils Relative: 2.1 % (ref 0.0–5.0)
HCT: 39.7 % (ref 36.0–46.0)
Hemoglobin: 13.1 g/dL (ref 12.0–15.0)
Lymphocytes Relative: 44.3 % (ref 12.0–46.0)
Lymphs Abs: 3.6 10*3/uL (ref 0.7–4.0)
MCHC: 33 g/dL (ref 30.0–36.0)
MCV: 94.9 fl (ref 78.0–100.0)
Monocytes Absolute: 1 10*3/uL (ref 0.1–1.0)
Monocytes Relative: 11.7 % (ref 3.0–12.0)
Neutro Abs: 3.4 10*3/uL (ref 1.4–7.7)
Neutrophils Relative %: 41.5 % — ABNORMAL LOW (ref 43.0–77.0)
Platelets: 319 10*3/uL (ref 150.0–400.0)
RBC: 4.18 Mil/uL (ref 3.87–5.11)
RDW: 13.4 % (ref 11.5–15.5)
WBC: 8.2 10*3/uL (ref 4.0–10.5)

## 2022-06-10 LAB — BRAIN NATRIURETIC PEPTIDE: Pro B Natriuretic peptide (BNP): 37 pg/mL (ref 0.0–100.0)

## 2022-06-10 MED ORDER — DIAZEPAM 5 MG PO TABS: 5.0000 mg | ORAL_TABLET | Freq: Every day | ORAL | 2 refills | Status: DC | PRN

## 2022-06-10 MED ORDER — NYSTATIN 100000 UNIT/ML MT SUSP: 5.0000 mL | Freq: Four times a day (QID) | OROMUCOSAL | 0 refills | Status: DC

## 2022-06-10 NOTE — Patient Instructions (Addendum)
  Inhaled steroids are the main treatment of asthma, however, there is a risk of thrush while using any of these inhalers   Recommendations: - Resume Symbicort 149mg two puffs morning and evening with spacer (rinse mouth after use) - Continue Spiriva Respimat 1.249m two puffs daily in the morning  - Continue Singulair '10mg'$  at bedtime - Use albuterol 2 puffs every 4-6 hours as needed for breakthrough shortness of breath - Refilling Nystain to use as needed for thrush  - We will check labs to see if you qualify for biologics for treatment of asthma if unable to continue inhalers   Orders: - Labs (Cbc with diff, IgE) and CXR today  - Resp viral swab  Follow-up: - First available with Dr. SoHalford Chessman

## 2022-06-10 NOTE — Progress Notes (Signed)
Please let patient know CXR and CBC/BNP were normal. Still waiting one lab test, I believe IGE level

## 2022-06-10 NOTE — Progress Notes (Signed)
$'@Patient'F$  ID: Angela Mendoza, female    DOB: [birth year], 71 y.o.   MRN: [id number]  Chief Complaint  Patient presents with   Follow-up    She has cough that is dry,  but one time she had white sputum,  Only on spiriva at this moment,     Referring provider: Crecencio Mc, MD  HPI: 71 year old female, former smoker. PMH significant for hypertension, paroxysmal A-fib, asthma, chronic rhinitis, OSA on CPAP, GERD, type 2 diabetes, hypothyroidism, obesity deficiency.  Patient of Dr. Halford Chessman.   06/10/2022 Patient presents today for overview due to asthma. She was last seen in office by Dr. Erin Fulling on 03/26/2022 for acute bronchitis treated with azithromycin and prednisone taper.  In July she was changed from Symbicort 165mcg to Qvar 9mcg. She called our office in July with recurrent thrush symptoms wanting to come off all steroid inhalers. We discontinued Qvar and added Spiriva Respimat 1.51mcg two puffs once daily. Diflucan was sent in for thrush symptoms.   She has had a cough off and on for the last month which has been worse last 2 weeks. She is no longer on any steroid inhalers. She is currently using Spiriva Respimat 1.4mcg two puff daily. Ritta Slot has gone away. Qvar made her cough worse, she prefers Symbicort. She has associated wheezing and chest tightness.   Allergies  Allergen Reactions   Codeine Other (See Comments)    Low blood pressure/nausea   Qvar [Beclomethasone] Other (See Comments)    Thrush per patient even with rinsing.    Sulfonamide Derivatives Rash    rash    Immunization History  Administered Date(s) Administered   Fluad Quad(high Dose 65+) 07/16/2019, 07/20/2020, 08/05/2021   Influenza Split 07/15/2013, 07/12/2015, 07/14/2016, 08/04/2017   Influenza Whole 07/27/2009, 07/28/2011, 07/21/2012   Influenza, High Dose Seasonal PF 07/22/2017, 07/01/2018   Influenza,inj,Quad PF,6+ Mos 08/03/2014, 07/12/2015, 07/14/2016   Influenza-Unspecified 07/15/2013, 08/03/2014,  07/12/2015, 07/14/2016, 08/04/2017   Moderna SARS-COV2 Booster Vaccination 08/17/2020, 02/01/2021   Moderna Sars-Covid-2 Vaccination 12/11/2019, 01/08/2020, 07/25/2021   Pneumococcal Conjugate-13 11/08/2014   Pneumococcal Polysaccharide-23 07/27/2009, 12/27/2015   Td 10/27/2008   Tdap 12/19/2013, 02/25/2017   Tetanus 10/27/2008   Zoster Recombinat (Shingrix) 04/27/2020, 11/26/2020   Zoster, Live 10/27/2009    Past Medical History:  Diagnosis Date   Agatston coronary artery calcium score less than 100    a. 06/2018 Cardiac CT: Ca2+ = 3 (49th %'ile).   Allergic rhinitis 06/09/2008   chronic   Aortic atherosclerosis ([place])    a. 06/2018 noted on chest CT.   Asthma, intrinsic 10/06/2011   controlled on qvar. [name] 09/2011:  Very mild airflow obstruction (FEV1% 68, FVL c/w obstruction)    Basal cell carcinoma 06/10/2021   right prox nasal ala rim - MOHs 07/12/21 Dr. Lacinda Axon   Chronic headache 06/09/2008   COPD (chronic obstructive pulmonary disease) ([place])    Depression    Diverticulosis 2013   h/o diverticulitis   Dry eyes    GERD (gastroesophageal reflux disease)    History of breast implant removal    silicone mastitis - R axilla silicone LN, free silicone L breast upper outer quadrant   Hot flashes    HYPERLIPIDEMIA 06/09/2008   HYPERTENSION 06/09/2008   Hypothyroidism    MVP (mitral valve prolapse)    OSA on CPAP    [name]   PAF (paroxysmal atrial fibrillation) ([place]) 06/09/2008   a. CHA2DS2VASc = 3-->prefers ASA.   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 Hx of children who are grown   Outpatient Medications Prior to Visit  Medication Sig Dispense Refill   albuterol (PROVENTIL HFA) 108 (90 Base) MCG/ACT inhaler Inhale 2 puffs into the lungs every 6 (six) hours as needed. 18 g 2   atorvastatin (LIPITOR) 40 MG tablet Take 1 tablet (40 mg total) by mouth daily. 90 tablet 3   azelastine (ASTELIN) 0.1 % nasal spray Place 2 sprays into both nostrils 2 (two) times daily. Use in each nostril as directed 90 mL 3   Blood Glucose Monitoring Suppl (ONE TOUCH ULTRA 2) w/Device KIT Used to check blood glucose daily. 1 kit 0   chlorpheniramine (CHLOR-TRIMETON) 4 MG tablet Take 1 tablet (4 mg total) by mouth at bedtime. (Patient not taking: Reported on 07/03/2022) 90 tablet 3   Cholecalciferol (VITAMIN D3) 50 MCG (2000 UT) TABS Take 1 tablet by mouth daily. 90 tablet 3   Coenzyme Q10 (COQ10 PO) Take by mouth daily.     conjugated estrogens (PREMARIN) vaginal cream Place vaginally 2 (two) times a week. INSERT 0.5 APPLICATORS FULL VAGINALLY TWICE PER WEEK. AT BEDTIME. 30 g 4   cyanocobalamin (VITAMIN B12) 1000 MCG/ML injection Inject 1 mL (1,000 mcg total) into the muscle every 30 (thirty) days. 3 mL 3   cycloSPORINE (RESTASIS) 0.05 % ophthalmic emulsion Place 1 drop into both eyes 2 (two) times daily. 3 each 3   Docusate Calcium (STOOL SOFTENER PO) Take 1 tablet by mouth as needed.     EPINEPHrine 0.3 mg/0.3 mL IJ SOAJ injection 0.3 mg by injection route. 1 each 1   estradiol (VIVELLE-DOT) 0.1 MG/24HR patch Place 1 patch (0.1 mg total) onto the skin 2 (two) times a week. 24 patch 3   famotidine (PEPCID) 20 MG tablet Take 1 tablet (20 mg total) by mouth daily as needed for heartburn or indigestion. 90 tablet 3   fexofenadine (ALLEGRA ALLERGY) 180 MG tablet Take 1 tablet (180 mg total) by mouth daily. 90 tablet 3   fluticasone (FLONASE) 50 MCG/ACT nasal spray Place 1 spray  into both nostrils daily. 48 g 3   levothyroxine (SYNTHROID) 50 MCG tablet Take 1 tablet (50 mcg total) by mouth daily before breakfast. 90 tablet 3   losartan (COZAAR) 50 MG tablet Take 1 tablet (50 mg total) by mouth at bedtime. 90 tablet 3   MAGNESIUM PO Take 250 mg by mouth daily.     meclizine (ANTIVERT) 25 MG tablet Take 1 tablet (25 mg total) by mouth 3 (three) times daily as needed (vertigo). 84 tablet 3   metoprolol succinate (TOPROL-XL) 50 MG 24 hr tablet Take 1 tablet (50 mg total) by mouth daily. 90 tablet 3   montelukast (SINGULAIR) 10 MG tablet Take 1 tablet (10 mg total) by mouth at bedtime. 90 tablet 3   Multiple Vitamin (MULTIVITAMIN) tablet Take 1 tablet by mouth daily.     mupirocin ointment (BACTROBAN)  2 % Apply topically.     ondansetron (ZOFRAN ODT) 4 MG disintegrating tablet Take 1 tablet (4 mg total) by mouth every 8 (eight) hours as needed for nausea or vomiting. 30 tablet 2   Propylene Glycol 0.6 % SOLN Apply 1 drop to eye as needed.     psyllium (METAMUCIL) 58.6 % powder Take 1 packet by mouth as needed.     Syringe/Needle, Disp, (SYRINGE 3CC/20GX1") 20G X 1" 3 ML MISC 1 Syringe by Does not apply route every 30 (thirty) days. For use with b-12 injection monthly 50 each 0   Tiotropium Bromide Monohydrate (SPIRIVA RESPIMAT) 1.25 MCG/ACT AERS Inhale 2 puffs into the lungs daily. 12 g 3   tretinoin (RETIN-A) 0.05 % cream Apply topically at bedtime. 45 g 1   ALPRAZolam (XANAX) 0.25 MG tablet TAKE 1 TABLET (0.25 MG TOTAL) BY MOUTH ONCE DAILY AS NEEDED FOR SLEEP. 30 tablet 5   diazepam (VALIUM) 5 MG tablet Take 1 tablet (5 mg total) by mouth daily as needed for anxiety. 30 tablet 2   Glucose Blood (BLOOD GLUCOSE TEST STRIPS 333) STRP FOR ONE TOUCH METER    NEW ONSET TYPE 2 DIABETES 25 strip 0   glucose blood test strip USED TO CHECK BLOOD SUGARS TWICE DAILY. 100 each 12   Lancets (ONETOUCH ULTRASOFT) lancets Use as instructed 100 each 12   Lancets (ONETOUCH ULTRASOFT) lancets  USED TO CHECK BLOOD SUGAR TWICE DAILY. 100 each 12   meloxicam (MOBIC) 15 MG tablet Take 1 tablet (15 mg total) by mouth daily. 30 tablet 1   urea (CARMOL) 10 % cream Apply topically as needed. 71 g 3   fluconazole (DIFLUCAN) 150 MG tablet Take 1 tablet (150 mg total) by mouth daily. Can take additional tablet after 72 hours. (Patient not taking: Reported on 06/10/2022) 2 tablet 0   methylPREDNISolone (MEDROL DOSEPAK) 4 MG TBPK tablet 6 day dose pack - take as directed (Patient not taking: Reported on 06/10/2022) 21 tablet 0   No facility-administered medications prior to visit.   Review of Systems  Review of Systems  Constitutional: Negative.   HENT: Negative.    Respiratory:  Positive for cough, chest tightness, shortness of breath and wheezing.    Physical Exam  BP (!) 142/68 (BP Location: Right Arm, Patient Position: Sitting)   Pulse 65   Temp 98.7 F (37.1 C) (Oral)   Ht $R'5\' 5"'vq$  (1.651 m)   Wt 188 lb (85.3 kg)   SpO2 97%   BMI 31.28 kg/m  Physical Exam Constitutional:      Appearance: Normal appearance.  HENT:     Head: Normocephalic and atraumatic.     Mouth/Throat:     Mouth: Mucous membranes are moist.     Pharynx: Oropharynx is clear.  Cardiovascular:     Rate and Rhythm: Normal rate and regular rhythm.  Pulmonary:     Effort: Pulmonary effort is normal.     Breath sounds: Normal breath sounds.  Musculoskeletal:        General: Normal range of motion.  Skin:    General: Skin is warm and dry.  Neurological:     General: No focal deficit present.     Mental Status: She is alert and oriented to person, place, and time. Mental status is at baseline.  Psychiatric:        Mood and Affect: Mood normal.        Behavior: Behavior normal.        Thought Content:  Thought content normal.        Judgment: Judgment normal.      Lab Results:  CBC    Component Value Date/Time   WBC 8.2 06/10/2022 0941   RBC 4.18 06/10/2022 0941   HGB 13.1 06/10/2022 0941   HGB 13.7  12/07/2012 0000   HCT 39.7 06/10/2022 0941   PLT 319.0 06/10/2022 0941   MCV 94.9 06/10/2022 0941   MCH 32.1 05/30/2022 1432   MCHC 33.0 06/10/2022 0941   RDW 13.4 06/10/2022 0941   LYMPHSABS 3.6 06/10/2022 0941   MONOABS 1.0 06/10/2022 0941   EOSABS 0.2 06/10/2022 0941   BASOSABS 0.0 06/10/2022 0941    BMET    Component Value Date/Time   NA 136 05/30/2022 1428   NA 142 09/26/2016 0000   K 4.8 05/30/2022 1428   CL 100 05/30/2022 1428   CO2 27 05/30/2022 1428   GLUCOSE 90 05/30/2022 1428   BUN 9 05/30/2022 1428   BUN 12 09/26/2016 0000   CREATININE 0.97 05/30/2022 1428   CALCIUM 9.3 05/30/2022 1428   GFRNONAA >60 12/29/2020 1419    BNP No results found for: "BNP"  ProBNP    Component Value Date/Time   PROBNP 37.0 06/10/2022 0941    Imaging: DG Chest 2 View  Result Date: 06/10/2022 CLINICAL DATA:  Bronchitis EXAM: CHEST - 2 VIEW COMPARISON:  12/16/2021 FINDINGS: The heart size and mediastinal contours are within normal limits. Both lungs are clear. The visualized skeletal structures are unremarkable. IMPRESSION: No active cardiopulmonary disease. Electronically Signed   By: Franchot Gallo M.D.   On: 06/10/2022 10:11   DG Foot Complete Left  Result Date: 06/06/2022 Please see detailed radiograph report in office note.    Assessment & Plan:   Asthma, intrinsic - Patient has had a cough x 1 month with associated chest tightness and wheezing. Spirometry in 2012 showed mild airflow obstruction/ FEV1 68%. She is currently not on any ICS inhalers. Recommend resuming symbicort . Reviewed risks of thrush. Recommending using maintenance inhaler with spacer. Continue Spiriva respimat 1.59mcg, Singulair 10mg  at beditme and prn Albuterol.Checking labs to see if she would be a good candidate for biologics if unable to tolerate oral steroids and symptoms persist.   Recommendations: - Resume Symbicort 17mcg two puffs morning and evening with spacer (rinse mouth after use) -  Continue Spiriva Respimat 1.27mcg two puffs daily in the morning  - Continue Singulair 10mg  at bedtime - Use albuterol 2 puffs every 4-6 hours as needed for breakthrough shortness of breath - Refilling Nystain to use as needed for thrush  - We will check labs to see if you qualify for biologics for treatment of asthma if unable to continue inhalers   Orders: - Labs (Cbc with diff, IgE) and CXR today  - Resp viral swab  Follow-up: - First available with Dr. Almedia Balls, NP 07/06/2022

## 2022-06-10 NOTE — Telephone Encounter (Signed)
[place] pharmacy choices were  in chart  with nothing in message to confirm which one to sent it to,  so the Diazepam has been refilled and sent to [facility] .  (Mail order of controlled substances was the dafault, which is not permitted )

## 2022-06-11 LAB — IGE: IgE (Immunoglobulin E), Serum: 18 kU/L (ref <=114)

## 2022-06-11 MED ORDER — AMOXICILLIN-POT CLAVULANATE 875-125 MG PO TABS: 1.0000 | ORAL_TABLET | Freq: Two times a day (BID) | ORAL | 0 refills | Status: DC

## 2022-06-11 NOTE — Progress Notes (Signed)
Ill send in abx

## 2022-06-12 LAB — RESPIRATORY VIRUS PANEL

## 2022-06-12 NOTE — Telephone Encounter (Signed)
Tried to call the pharmacy to find out what meter they are filling for her so test strips and lancets but pharmacy is closed until 3pm.

## 2022-06-16 ENCOUNTER — Ambulatory Visit: Payer: Medicare Other | Admitting: Internal Medicine

## 2022-06-16 MED ORDER — BUDESONIDE-FORMOTEROL FUMARATE 160-4.5 MCG/ACT IN AERO: 2.0000 | INHALATION_SPRAY | Freq: Two times a day (BID) | RESPIRATORY_TRACT | 3 refills | Status: DC

## 2022-06-17 ENCOUNTER — Telehealth: Payer: Self-pay | Admitting: Pulmonary Disease

## 2022-06-17 ENCOUNTER — Telehealth: Payer: Self-pay

## 2022-06-17 ENCOUNTER — Other Ambulatory Visit: Payer: Self-pay | Admitting: Primary Care

## 2022-06-17 DIAGNOSIS — N6011 Diffuse cystic mastopathy of right breast: Secondary | ICD-10-CM | POA: Diagnosis not present

## 2022-06-17 DIAGNOSIS — N6012 Diffuse cystic mastopathy of left breast: Secondary | ICD-10-CM | POA: Diagnosis not present

## 2022-06-17 DIAGNOSIS — Z1211 Encounter for screening for malignant neoplasm of colon: Secondary | ICD-10-CM | POA: Diagnosis not present

## 2022-06-17 MED ORDER — ONETOUCH DELICA LANCETS 33G MISC
3 refills | Status: DC
Start: 2022-06-17 — End: 2022-07-03

## 2022-06-17 MED ORDER — BENZONATATE 200 MG PO CAPS: 200.0000 mg | ORAL_CAPSULE | Freq: Three times a day (TID) | ORAL | 1 refills | Status: DC | PRN

## 2022-06-17 NOTE — Telephone Encounter (Signed)
Patient states [facility] gave [place] incorrect information and we ordered the wrong lancets.  Patient states she needs the OneTouch Delica Plus Lancets.  Patient states she would like for [place] to send the prescription to [facility] and she states they like to do a 90-day supply for refills.

## 2022-06-17 NOTE — Telephone Encounter (Signed)
Can send script for tessalon 200 mg tid prn, #30 with 1 refill. 

## 2022-06-17 NOTE — Telephone Encounter (Signed)
Correct lancets have been sent to [facility]

## 2022-06-17 NOTE — Telephone Encounter (Signed)
Called and spoke with pt letting her know recs per [facility] and she verbalized understanding. Rx for tessalon has been sent to preferred pharmacy. Nothing further needed.

## 2022-06-17 NOTE — Telephone Encounter (Signed)
Called and spoke with pt who states she finished the prednisone 2 days ago and that she finished the abx yesterday 8/21. Pt said that she is still coughing even after finishing the meds.  Pt said that when she coughs, her cheeks will also get real red and they will remain red throughout the day.  Pt wants to know what might be able to be recommended. Dr. Halford Chessman, please advise.

## 2022-06-18 ENCOUNTER — Telehealth: Payer: Self-pay | Admitting: Podiatry

## 2022-06-18 ENCOUNTER — Ambulatory Visit: Payer: Medicare Other | Admitting: Primary Care

## 2022-06-18 NOTE — Telephone Encounter (Signed)
Pt called in stating that she feels better and doesn't want her Rx for pain and doesn't plan on taking it and could it please be removed off of her account.  Please advise

## 2022-06-19 ENCOUNTER — Other Ambulatory Visit: Payer: Self-pay | Admitting: Podiatry

## 2022-06-19 ENCOUNTER — Ambulatory Visit: Payer: Medicare Other | Admitting: Nurse Practitioner

## 2022-06-19 ENCOUNTER — Ambulatory Visit: Payer: Medicare Other | Admitting: Primary Care

## 2022-07-03 ENCOUNTER — Encounter: Payer: Self-pay | Admitting: Internal Medicine

## 2022-07-04 ENCOUNTER — Encounter: Payer: Self-pay | Admitting: Internal Medicine

## 2022-07-04 ENCOUNTER — Ambulatory Visit (INDEPENDENT_AMBULATORY_CARE_PROVIDER_SITE_OTHER): Payer: Medicare Other | Admitting: Internal Medicine

## 2022-07-04 ENCOUNTER — Telehealth: Payer: Self-pay

## 2022-07-04 VITALS — BP 128/72 | HR 82 | Temp 97.9°F | Resp 16 | Ht 65.0 in | Wt 183.1 lb

## 2022-07-04 DIAGNOSIS — J45909 Unspecified asthma, uncomplicated: Secondary | ICD-10-CM | POA: Diagnosis not present

## 2022-07-04 DIAGNOSIS — L989 Disorder of the skin and subcutaneous tissue, unspecified: Secondary | ICD-10-CM | POA: Diagnosis not present

## 2022-07-04 DIAGNOSIS — E119 Type 2 diabetes mellitus without complications: Secondary | ICD-10-CM | POA: Diagnosis not present

## 2022-07-04 DIAGNOSIS — M722 Plantar fascial fibromatosis: Secondary | ICD-10-CM | POA: Diagnosis not present

## 2022-07-04 DIAGNOSIS — E1169 Type 2 diabetes mellitus with other specified complication: Secondary | ICD-10-CM

## 2022-07-04 DIAGNOSIS — E785 Hyperlipidemia, unspecified: Secondary | ICD-10-CM

## 2022-07-04 MED ORDER — ALPRAZOLAM 0.25 MG PO TABS: ORAL_TABLET | ORAL | 5 refills | Status: DC

## 2022-07-04 MED ORDER — UREA 10 % EX CREA
TOPICAL_CREAM | CUTANEOUS | 3 refills | Status: DC | PRN
Start: 2022-07-04 — End: 2022-08-13

## 2022-07-04 MED ORDER — BLOOD GLUCOSE TEST STRIPS 333 VI STRP: ORAL_STRIP | 3 refills | Status: DC

## 2022-07-04 NOTE — Telephone Encounter (Signed)
Patient states she understood [place] today at her visit to say she did not need to have labs drawn, but she is seeing lab orders on MyChart.  Patient requests we please call her to clarify.

## 2022-07-04 NOTE — Assessment & Plan Note (Signed)
Currently asymptomatic except for mild nonproductive cough since her COVID infection.  Chest x ray normal August 15 . Continue symbicort, Singulair  and Spiriva.    Advised to receive the  RSV  Vaccine,  Letter written

## 2022-07-04 NOTE — Assessment & Plan Note (Signed)
Currently well-controlled with  dietary odificationsations . Patient is reminded to schedule an annual eye exam in the spring  and foot exam is normal today. Patient has no microalbuminuria. Patient is tolerating statin therapy for CAD risk reduction and on ACE/ARB for renal protection and hypertension   . Lab Results  Component Value Date   HGBA1C 5.9 (H) 05/30/2022   Lab Results  Component Value Date   MICROALBUR <0.2 05/30/2022

## 2022-07-04 NOTE — Progress Notes (Unsigned)
Subjective:  Patient ID: Angela Mendoza, female    DOB: 09-10-1951  Age: 71 y.o. MRN: [id number]  CC: The primary encounter diagnosis was Controlled type 2 diabetes mellitus without complication, without long-term current use of insulin ([place]). Diagnoses of Hyperlipidemia associated with type 2 diabetes mellitus ([place]), Skin lesion of back, Plantar fasciitis of left foot, and Asthma, intrinsic were also pertinent to this visit.   HPI Angela Mendoza presents for follow up o newly diagnosed diabetes   Chief Complaint  Patient presents with   Follow-up    3 month follow up on diabetes Doing well  Needs refill on Xanax and Ura cream [facility]     T2DM:  Newly diagnosed.  She  feels generally well,   is restricting her sugar and trying to lose weight. Checking  blood sugars once daily I the morning  BS have been under 120  fasting and above 85 and < 150 post prandially.  Denies any recent hypoglyemic events.  Taking  medications as directed. Following a carbohydrate modified diet 6 days per week. Denies numbness, burning and tingling of extremities. Appetite is good.  Using one touch verio reflect and one touch lancets delice plus .  Heel pain:  she was diagnosed with plantar fasciitis involving the left heel ; was injected with celestone by Daylene Katayama on August 11:   her pain was temporarily relieved.    Has an enlarging red lesion on left buttock,  worried she has a skin CA ever snce Jimmy Buffet died.  Has an appt for kin CA screening  at [facility] in one month but no derm follow up   Persistent cough since COVID infection.  Revewed chest xray done august 23  clear. Postponing urogyn surgery until November    Outpatient Medications Prior to Visit  Medication Sig Dispense Refill   albuterol (PROVENTIL HFA) 108 (90 Base) MCG/ACT inhaler Inhale 2 puffs into the lungs every 6 (six) hours as needed. 18 g 2   atorvastatin (LIPITOR) 40 MG tablet Take 1 tablet (40 mg total) by mouth daily. 90  tablet 3   azelastine (ASTELIN) 0.1 % nasal spray Place 2 sprays into both nostrils 2 (two) times daily. Use in each nostril as directed 90 mL 3   benzonatate (TESSALON) 200 MG capsule Take 1 capsule (200 mg total) by mouth 3 (three) times daily as needed for cough. 30 capsule 1   Blood Glucose Monitoring Suppl (ONE TOUCH ULTRA 2) w/Device KIT Used to check blood glucose daily. 1 kit 0   budesonide-formoterol (SYMBICORT) 160-4.5 MCG/ACT inhaler Inhale 2 puffs into the lungs in the morning and at bedtime. 90 each 3   Cholecalciferol (VITAMIN D3) 50 MCG (2000 UT) TABS Take 1 tablet by mouth daily. 90 tablet 3   Coenzyme Q10 (COQ10 PO) Take by mouth daily.     conjugated estrogens (PREMARIN) vaginal cream Place vaginally 2 (two) times a week. INSERT 0.5 APPLICATORS FULL VAGINALLY TWICE PER WEEK. AT BEDTIME. 30 g 4   cyanocobalamin (VITAMIN B12) 1000 MCG/ML injection Inject 1 mL (1,000 mcg total) into the muscle every 30 (thirty) days. 3 mL 3   cycloSPORINE (RESTASIS) 0.05 % ophthalmic emulsion Place 1 drop into both eyes 2 (two) times daily. 3 each 3   Docusate Calcium (STOOL SOFTENER PO) Take 1 tablet by mouth as needed.     EPINEPHrine 0.3 mg/0.3 mL IJ SOAJ injection 0.3 mg by injection route. 1 each 1   estradiol (VIVELLE-DOT) 0.1  MG/24HR patch Place 1 patch (0.1 mg total) onto the skin 2 (two) times a week. 24 patch 3   famotidine (PEPCID) 20 MG tablet Take 1 tablet (20 mg total) by mouth daily as needed for heartburn or indigestion. 90 tablet 3   fexofenadine (ALLEGRA ALLERGY) 180 MG tablet Take 1 tablet (180 mg total) by mouth daily. 90 tablet 3   fluticasone (FLONASE) 50 MCG/ACT nasal spray Place 1 spray into both nostrils daily. 48 g 3   levothyroxine (SYNTHROID) 50 MCG tablet Take 1 tablet (50 mcg total) by mouth daily before breakfast. 90 tablet 3   losartan (COZAAR) 50 MG tablet Take 1 tablet (50 mg total) by mouth at bedtime. 90 tablet 3   MAGNESIUM PO Take 250 mg by mouth daily.      meclizine (ANTIVERT) 25 MG tablet Take 1 tablet (25 mg total) by mouth 3 (three) times daily as needed (vertigo). 84 tablet 3   metoprolol succinate (TOPROL-XL) 50 MG 24 hr tablet Take 1 tablet (50 mg total) by mouth daily. 90 tablet 3   montelukast (SINGULAIR) 10 MG tablet Take 1 tablet (10 mg total) by mouth at bedtime. 90 tablet 3   Multiple Vitamin (MULTIVITAMIN) tablet Take 1 tablet by mouth daily.     mupirocin ointment (BACTROBAN) 2 % Apply topically.     nystatin (MYCOSTATIN) 500000 units TABS tablet Take 1 tablet (500,000 Units total) by mouth in the morning, at noon, in the evening, and at bedtime. 60 tablet 0   ondansetron (ZOFRAN ODT) 4 MG disintegrating tablet Take 1 tablet (4 mg total) by mouth every 8 (eight) hours as needed for nausea or vomiting. 30 tablet 2   Propylene Glycol 0.6 % SOLN Apply 1 drop to eye as needed.     psyllium (METAMUCIL) 58.6 % powder Take 1 packet by mouth as needed.     Syringe/Needle, Disp, (SYRINGE 3CC/20GX1") 20G X 1" 3 ML MISC 1 Syringe by Does not apply route every 30 (thirty) days. For use with b-12 injection monthly 50 each 0   Tiotropium Bromide Monohydrate (SPIRIVA RESPIMAT) 1.25 MCG/ACT AERS Inhale 2 puffs into the lungs daily. 12 g 3   tretinoin (RETIN-A) 0.05 % cream Apply topically at bedtime. 45 g 1   diazepam (VALIUM) 5 MG tablet Take 1 tablet (5 mg total) by mouth daily as needed for anxiety. 30 tablet 2   Glucose Blood (BLOOD GLUCOSE TEST STRIPS 333) STRP FOR ONE TOUCH METER    NEW ONSET TYPE 2 DIABETES 25 strip 0   glucose blood test strip USED TO CHECK BLOOD SUGARS TWICE DAILY. 100 each 12   urea (CARMOL) 10 % cream Apply topically as needed. 71 g 3   chlorpheniramine (CHLOR-TRIMETON) 4 MG tablet Take 1 tablet (4 mg total) by mouth at bedtime. (Patient not taking: Reported on 07/03/2022) 90 tablet 3   ALPRAZolam (XANAX) 0.25 MG tablet TAKE 1 TABLET (0.25 MG TOTAL) BY MOUTH ONCE DAILY AS NEEDED FOR SLEEP. 30 tablet 5   amoxicillin-clavulanate  (AUGMENTIN) 875-125 MG tablet Take 1 tablet by mouth 2 (two) times daily. 10 tablet 0   Lancets (ONETOUCH ULTRASOFT) lancets Use as instructed 100 each 12   Lancets (ONETOUCH ULTRASOFT) lancets USED TO CHECK BLOOD SUGAR TWICE DAILY. 100 each 12   methylPREDNISolone (MEDROL DOSEPAK) 4 MG TBPK tablet 6 day dose pack - take as directed (Patient not taking: Reported on 06/10/2022) 21 tablet 0   OneTouch Delica Lancets 53G MISC Use to check blood sugars  once daily. 100 each 3   No facility-administered medications prior to visit.    Review of Systems;  Patient denies headache, fevers, malaise, unintentional weight loss, skin rash, eye pain, sinus congestion and sinus pain, sore throat, dysphagia,  hemoptysis , cough, dyspnea, wheezing, chest pain, palpitations, orthopnea, edema, abdominal pain, nausea, melena, diarrhea, constipation, flank pain, dysuria, hematuria, urinary  Frequency, nocturia, numbness, tingling, seizures,  Focal weakness, Loss of consciousness,  Tremor, insomnia, depression, anxiety, and suicidal ideation.      Objective:  BP 128/72   Pulse 82   Temp 97.9 F (36.6 C) (Oral)   Resp 16   Ht $R'5\' 5"'fX$  (1.651 m)   Wt 183 lb 2 oz (83.1 kg)   SpO2 99%   BMI 30.47 kg/m   BP Readings from Last 3 Encounters:  07/04/22 128/72  06/10/22 (!) 142/68  05/30/22 116/80    Wt Readings from Last 3 Encounters:  07/04/22 183 lb 2 oz (83.1 kg)  06/10/22 188 lb (85.3 kg)  05/30/22 188 lb 6.4 oz (85.5 kg)    General appearance: alert, cooperative and appears stated age Ears: normal TM's and external ear canals both ears Throat: lips, mucosa, and tongue normal; teeth and gums normal Neck: no adenopathy, no carotid bruit, supple, symmetrical, trachea midline and thyroid not enlarged, symmetric, no tenderness/mass/nodules Back: symmetric, no curvature. ROM normal. No CVA tenderness. Lungs: clear to auscultation bilaterally Heart: regular rate and rhythm, S1, S2 normal, no murmur, click,  rub or gallop Abdomen: soft, non-tender; bowel sounds normal; no masses,  no organomegaly Pulses: 2+ and symmetric Skin: left buttock with teardrop shaped bullous /cystic lesion , salmon colored.  Scattered purpura covering arms .   Lymph nodes: Cervical, supraclavicular, and axillary nodes normal. Neuro:  awake and interactive with normal mood and affect. Higher cortical functions are normal. Speech is clear without word-finding difficulty or dysarthria. Extraocular movements are intact. Visual fields of both eyes are grossly intact. Sensation to light touch is grossly intact bilaterally of upper and lower extremities. Motor examination shows 4+/5 symmetric hand grip and upper extremity and 5/5 lower extremity strength. There is no pronation or drift. Gait is non-ataxic   Lab Results  Component Value Date   HGBA1C 5.9 (H) 05/30/2022   HGBA1C 6.5 05/28/2022   HGBA1C 5.9 10/12/2015    Lab Results  Component Value Date   CREATININE 0.97 05/30/2022   CREATININE 0.97 05/28/2022   CREATININE 0.90 02/27/2022    Lab Results  Component Value Date   WBC 8.2 06/10/2022   HGB 13.1 06/10/2022   HCT 39.7 06/10/2022   PLT 319.0 06/10/2022   GLUCOSE 90 05/30/2022   CHOL 142 05/30/2022   TRIG 100 05/30/2022   HDL 57 05/30/2022   LDLDIRECT 83.0 05/28/2020   LDLCALC 66 05/30/2022   ALT 25 05/30/2022   AST 26 05/30/2022   NA 136 05/30/2022   K 4.8 05/30/2022   CL 100 05/30/2022   CREATININE 0.97 05/30/2022   BUN 9 05/30/2022   CO2 27 05/30/2022   TSH 2.44 05/28/2022   INR 0.9 02/27/2022   HGBA1C 5.9 (H) 05/30/2022   MICROALBUR <0.2 05/30/2022    MM 3D SCREEN BREAST BILATERAL  Result Date: 04/21/2022 CLINICAL DATA:  Screening. EXAM: DIGITAL SCREENING BILATERAL MAMMOGRAM WITH TOMOSYNTHESIS AND CAD TECHNIQUE: Bilateral screening digital craniocaudal and mediolateral oblique mammograms were obtained. Bilateral screening digital breast tomosynthesis was performed. The images were evaluated  with computer-aided detection. COMPARISON:  Previous exam(s). ACR Breast Density Category b:  There are scattered areas of fibroglandular density. FINDINGS: There are no findings suspicious for malignancy. IMPRESSION: No mammographic evidence of malignancy. A result letter of this screening mammogram will be mailed directly to the patient. RECOMMENDATION: Screening mammogram in one year. (Code:SM-B-01Y) BI-RADS CATEGORY  1: Negative. Electronically Signed   By: Lajean Manes M.D.   On: 04/21/2022 10:48    Assessment & Plan:   Problem List Items Addressed This Visit     Asthma, intrinsic    Currently asymptomatic except for mild nonproductive cough since her COVID infection.  Chest x ray normal August 15 . Continue symbicort, Singulair  and Spiriva.    Advised to receive the  RSV  Vaccine,  Letter written       Controlled type 2 diabetes mellitus without complication, without long-term current use of insulin ([place]) - Primary    Currently well-controlled with  dietary modifications and restriction of simple sugars. Patient is reminded to schedule an annual eye exam in the spring  and foot exam is normal today. Patient has no microalbuminuria. Patient is tolerating statin therapy for CAD risk reduction and on ACE/ARB for renal protection and hypertension   . Lab Results  Component Value Date   HGBA1C 5.9 (H) 05/30/2022   Lab Results  Component Value Date   MICROALBUR <0.2 05/30/2022           Relevant Orders   Hemoglobin A1c   Comprehensive metabolic panel   Hyperlipidemia associated with type 2 diabetes mellitus ([place])   Relevant Orders   Lipid panel   LDL cholesterol, direct   Plantar fasciitis of left foot    Diagnosed after plain films were normal , no bone spurs.  Improved transiently after steroid injection by podiatry .  Advised to return for follow up since pain has returned       Skin lesion of back    Dermatology referral to [facility] made to eval lesion on left  buttock that is cystic appearing.       Relevant Orders   Ambulatory referral to Dermatology    I spent a total of  40 minutes with this patient in a face to face visit on the date of this encounter reviewing the last office visit with me , her  most recent visit with podiatry ,  patient's diet and exercise habits, home blood pressure /blood sugar readings,  and post visit ordering of testing and therapeutics.    Follow-up: Return in about 6 months (around 01/02/2023) for follow up diabetes.   Crecencio Mc, MD

## 2022-07-04 NOTE — Assessment & Plan Note (Signed)
Diagnosed after plain films were normal , no bone spurs.  Improved transiently after steroid injection by podiatry .  Advised to return for follow up since pain has returned

## 2022-07-04 NOTE — Telephone Encounter (Signed)
Spoke w/ pt and informed her those are future labs ordered not for today

## 2022-07-04 NOTE — Patient Instructions (Addendum)
YOUR DIABETES IS UNDER EXCELLENT CONTROL  YOU DO NOT NEED TO CHECK SUGRS AS OFTEN NOW.  CHECK 2 HOURS  SAFTER A MEAL,  OR WHENEVER YOU FEEL SHAKY  POST PRANDIAL SUGARS SHOULD RANGE FROM 80 TO 160.  FASTINGS SHOULD BE 80 TO 120 , NOT BELOW 13  DON'T SKIP MEALS  EAT A BAR , OR DRINK A SHAKE    You might want to try a premixed protein drink called Premier Protein shake for breakfast or late night snack . It is great tasting,   very low sugar and available of < $2 serving at [facility] and  In bulk for $1.50/serving at [employer] and [employer]  .    Nutritional analysis :  160 cal  30 g protein  1 g sugar 50% calcium needs   Vladimir Faster and [employer]    Please get your annual diabetic eye exam  next spring

## 2022-07-04 NOTE — Assessment & Plan Note (Signed)
Dermatology referral to [facility] made to eval lesion on left buttock that is cystic appearing.

## 2022-07-06 ENCOUNTER — Encounter: Payer: Self-pay | Admitting: Internal Medicine

## 2022-07-06 NOTE — Assessment & Plan Note (Signed)
-   Patient has had a cough x 1 month with associated chest tightness and wheezing. Spirometry in 2012 showed mild airflow obstruction/ FEV1 68%. She is currently not on any ICS inhalers. Recommend resuming symbicort . Reviewed risks of thrush. Recommending using maintenance inhaler with spacer. Continue Spiriva respimat 1.23mg, Singulair '10mg'$  at beditme and prn Albuterol.Checking labs to see if she would be a good candidate for biologics if unable to tolerate oral steroids and symptoms persist.   Recommendations: - Resume Symbicort 1670m two puffs morning and evening with spacer (rinse mouth after use) - Continue Spiriva Respimat 1.2578mtwo puffs daily in the morning  - Continue Singulair '10mg'$  at bedtime - Use albuterol 2 puffs every 4-6 hours as needed for breakthrough shortness of breath - Refilling Nystain to use as needed for thrush  - We will check labs to see if you qualify for biologics for treatment of asthma if unable to continue inhalers   Orders: - Labs (Cbc with diff, IgE) and CXR today  - Resp viral swab  Follow-up: - First available with Dr. SooHalford Chessman

## 2022-07-07 ENCOUNTER — Other Ambulatory Visit: Payer: Self-pay

## 2022-07-07 MED ORDER — ONETOUCH VERIO HIGH VI SOLN
0 refills | Status: DC
Start: 2022-07-07 — End: 2024-01-12

## 2022-07-07 MED ORDER — ONETOUCH VERIO HIGH VI SOLN: 0 refills | Status: DC

## 2022-07-07 MED ORDER — ONETOUCH VERIO VI STRP: ORAL_STRIP | 12 refills | Status: DC

## 2022-07-07 MED ORDER — ONETOUCH DELICA LANCETS 33G MISC: 5 refills | Status: DC

## 2022-07-07 NOTE — Telephone Encounter (Signed)
Pt called stating she want to cancel the meds by mail  order for the one touch ultra

## 2022-07-08 ENCOUNTER — Ambulatory Visit (INDEPENDENT_AMBULATORY_CARE_PROVIDER_SITE_OTHER): Payer: Medicare Other | Admitting: Family

## 2022-07-08 ENCOUNTER — Encounter: Payer: Self-pay | Admitting: Family

## 2022-07-08 ENCOUNTER — Ambulatory Visit (INDEPENDENT_AMBULATORY_CARE_PROVIDER_SITE_OTHER): Payer: Medicare Other | Admitting: Pulmonary Disease

## 2022-07-08 ENCOUNTER — Encounter: Payer: Self-pay | Admitting: Pulmonary Disease

## 2022-07-08 VITALS — BP 132/78 | HR 94 | Temp 97.9°F | Ht 65.0 in | Wt 181.6 lb

## 2022-07-08 VITALS — BP 132/76 | HR 86 | Ht 65.0 in | Wt 183.6 lb

## 2022-07-08 DIAGNOSIS — R399 Unspecified symptoms and signs involving the genitourinary system: Secondary | ICD-10-CM

## 2022-07-08 DIAGNOSIS — R300 Dysuria: Secondary | ICD-10-CM | POA: Diagnosis not present

## 2022-07-08 DIAGNOSIS — Z9989 Dependence on other enabling machines and devices: Secondary | ICD-10-CM

## 2022-07-08 DIAGNOSIS — J45909 Unspecified asthma, uncomplicated: Secondary | ICD-10-CM

## 2022-07-08 DIAGNOSIS — G4733 Obstructive sleep apnea (adult) (pediatric): Secondary | ICD-10-CM | POA: Diagnosis not present

## 2022-07-08 DIAGNOSIS — R058 Other specified cough: Secondary | ICD-10-CM

## 2022-07-08 LAB — URINALYSIS, ROUTINE W REFLEX MICROSCOPIC
Bilirubin Urine: NEGATIVE
Ketones, ur: NEGATIVE
Nitrite: POSITIVE — AB
Specific Gravity, Urine: <=1.005 — AB (ref 1.000–1.030)
Urine Glucose: 100 — AB
Urobilinogen, UA: 1 (ref 0.0–1.0)
pH: 6 (ref 5.0–8.0)

## 2022-07-08 LAB — POCT URINALYSIS DIPSTICK
Bilirubin, UA: NEGATIVE
Glucose, UA: POSITIVE — AB
Ketones, UA: NEGATIVE
Nitrite, UA: POSITIVE
Protein, UA: POSITIVE — AB
Spec Grav, UA: <=1.005 — AB (ref 1.010–1.025)
Urobilinogen, UA: 1 E.U./dL
pH, UA: 5.5 (ref 5.0–8.0)

## 2022-07-08 MED ORDER — MAGIC MOUTHWASH W/LIDOCAINE: 5.0000 mL | Freq: Three times a day (TID) | ORAL | 1 refills | Status: DC | PRN

## 2022-07-08 MED ORDER — AMOXICILLIN-POT CLAVULANATE 875-125 MG PO TABS: 1.0000 | ORAL_TABLET | Freq: Two times a day (BID) | ORAL | 0 refills | Status: AC

## 2022-07-08 NOTE — Progress Notes (Addendum)
[facility]  Chief Complaint  Patient presents with   Follow-up     Constitutional:  BP 132/76 (BP Location: Left Arm, Cuff Size: Normal)   Pulse 86   Ht '5\' 5"'$  (1.651 m)   Wt 183 lb 9.6 oz (83.3 kg)   SpO2 97%   BMI 30.55 kg/m   Past Medical History:  Vit D deficiency, Vertigo, Rosacea, Hypothyroidism, HTN, HLD, GERD, Diverticulosis, Depression, HA, COVID 12 February 2021  Past Surgical History:  She  has a past surgical history that includes Cholecystectomy (2007); Appendectomy (2007); Tubal ligation; Total abdominal hysterectomy (1991); Nasal sinus surgery; Breast enhancement surgery (2006); Cardiovascular stress test (2013); Colonoscopy (08/2012); Esophagogastroduodenoscopy (08/2012); US ECHOCARDIOGRAPHY (09/2009); US ECHOCARDIOGRAPHY (11/2007); sleep study (08/2007); Rectocele repair (12/2008); Colonoscopy (2006); Breast biopsy (Left, 09/27/2009); and Hernia repair.  Brief Summary:  Angela Mendoza is a 71 y.o. female former smoker with OSA and asthma.      Subjective:   She saw Dr. Erin Fulling and Geraldo Pitter for asthma exacerbations since I last saw her.  Chest xray from 06/10/22 was normal.  She doesn't feel like prednisone helps with her breathing.  She isn't sure if symbicort or spiriva is helping more.  She still feels raw in her throat.  Not having cough or wheeze.  She needs to have surgery for vaginal prolapse and is on antibiotics because of a UTI.    Physical Exam:   Appearance - well kempt   ENMT - no sinus tenderness, no oral exudate, no LAN, Mallampati 2 airway, no stridor  Respiratory - equal breath sounds bilaterally, no wheezing or rales  CV - s1s2 regular rate and rhythm, no murmurs  Ext - no clubbing, no edema  Skin - no rashes  Psych - normal mood and affect     Pulmonary testing:  Spirometry 12/12 >> FEV1% 68 RAST 11/15/14 >> IgE 30, cats/mold PFT 02/07/16 >> FEV1 2.32 (92%), FEV1% 76, FEF 25-75% 1.56  (71%), TLC 4.25 (81%), DLCO 121, borderline BD from FEF 25-75 FeNO 12/01/18 >> 7 IgE 12/16/21 >> 23  Chest Imaging:  Coronary CT 04/04/21 >> stable 2 mm nodule LLL, coronary calcium score 0 CT sinus 01/09/22 >> normal  Sleep Tests:  PSG 09/06/07 >> AHI 19 CPAP 06/07/22 to 07/06/22 >> used on 30 of 30 nights with average 7 hrs 25 min.  Average AHI 0.5 with median CPAP 7 and 95 th percentile CPAP 9 cm H2O  Cardiac Tests:  Echo 01/23/21 >> EF 60 to 65%, grade 2 DD, RVSP 39.1 mmHg, mild MR  Social History:  She  reports that she quit smoking about 44 years ago. Her smoking use included cigarettes. She has a 2.10 pack-year smoking history. She has never used smokeless tobacco. She reports that she does not currently use alcohol. She reports that she does not use drugs.  Family History:  Her family history includes AAA (abdominal aortic aneurysm) in her paternal uncle; Aortic dissection in her maternal aunt and paternal uncle; Breast cancer in her maternal aunt; COPD in her sister; Cancer in her cousin and paternal aunt; Cancer (age of onset: 31) in her father; Cancer (age of onset: 37) in her mother; Heart attack in her paternal uncle; Heart disease in her paternal uncle; Heart failure in her sister; Hypertension in her sister; Stroke in her paternal uncle and sister.     Assessment/Plan:   Allergic asthma. - she gets scripts through [facility] - throat irritation continues  to be a problem; will have her try stopping symbicort for now - continue spiriva respimat and singulair - prn albuterol  Throat irritation. - will have her try magic mouth wash  Recurrent thrush. - from ICS inhalers; did better with symbicort and spacer device  - nystatin doesn't clear her infection; usually needs diflucan   Upper airway cough with allergic rhinitis. - dysmista caused funny taste - change azelastine to two sprays nightly - flonase 1 spray daily - continue singulair - tessalon prn - prn allegra    Obstructive sleep apnea. - she is compliant with CPAP and reports benefit from therapy - uses [employer] for her DME - current CPAP ordered May 2022 - continue auto CPAP 4 to 11 cm H2O  Vaginal prolapse. - will need to assess her fitness for surgery at her next visit  Time Spent Involved in Patient Care on Day of Examination:  38 minutes  Follow up:   Patient Instructions  Try stopping symbicort  Continue spiriva two puffs daily  Use flonase 1 spray in each nostril daily  Use astepro 2 sprays in each nostril nightly  Magic mouth wash 5 ml every 8 hours as needed  Follow up in 2 weeks with Dr. Halford Chessman  Medication List:   Allergies as of 07/08/2022       Reactions   Codeine Other (See Comments)   Low blood pressure/nausea   Qvar [beclomethasone] Other (See Comments)   Thrush per patient even with rinsing.    Sulfonamide Derivatives Rash   rash        Medication List        Accurate as of July 08, 2022  5:13 PM. If you have any questions, ask your nurse or doctor.          albuterol 108 (90 Base) MCG/ACT inhaler Commonly known as: Proventil HFA Inhale 2 puffs into the lungs every 6 (six) hours as needed.   ALPRAZolam 0.25 MG tablet Commonly known as: XANAX TAKE 1 TABLET (0.25 MG TOTAL) BY MOUTH ONCE DAILY AS NEEDED FOR SLEEP.   amoxicillin-clavulanate 875-125 MG tablet Commonly known as: AUGMENTIN Take 1 tablet by mouth 2 (two) times daily for 7 days. Started by: [name], FNP   atorvastatin 40 MG tablet Commonly known as: LIPITOR Take 1 tablet (40 mg total) by mouth daily.   azelastine 0.1 % nasal spray Commonly known as: ASTELIN Place 2 sprays into both nostrils 2 (two) times daily. Use in each nostril as directed   benzonatate 200 MG capsule Commonly known as: TESSALON Take 1 capsule (200 mg total) by mouth 3 (three) times daily as needed for cough.   budesonide-formoterol 160-4.5 MCG/ACT inhaler Commonly known as: Symbicort Inhale 2  puffs into the lungs in the morning and at bedtime.   chlorpheniramine 4 MG tablet Commonly known as: CHLOR-TRIMETON Take 1 tablet (4 mg total) by mouth at bedtime.   COQ10 PO Take by mouth daily.   cyanocobalamin 1000 MCG/ML injection Commonly known as: VITAMIN B12 Inject 1 mL (1,000 mcg total) into the muscle every 30 (thirty) days.   cycloSPORINE 0.05 % ophthalmic emulsion Commonly known as: RESTASIS Place 1 drop into both eyes 2 (two) times daily.   EPINEPHrine 0.3 mg/0.3 mL Soaj injection Commonly known as: EPI-PEN 0.3 mg by injection route.   estradiol 0.1 MG/24HR patch Commonly known as: VIVELLE-DOT Place 1 patch (0.1 mg total) onto the skin 2 (two) times a week.   famotidine 20 MG tablet Commonly known as:  PEPCID Take 1 tablet (20 mg total) by mouth daily as needed for heartburn or indigestion.   fexofenadine 180 MG tablet Commonly known as: Allegra Allergy Take 1 tablet (180 mg total) by mouth daily.   fluticasone 50 MCG/ACT nasal spray Commonly known as: FLONASE Place 1 spray into both nostrils daily.   levothyroxine 50 MCG tablet Commonly known as: Synthroid Take 1 tablet (50 mcg total) by mouth daily before breakfast.   losartan 50 MG tablet Commonly known as: COZAAR Take 1 tablet (50 mg total) by mouth at bedtime.   magic mouthwash w/lidocaine Soln Take 5 mLs by mouth 3 (three) times daily as needed for mouth pain. Started by: Chesley Mires, MD   MAGNESIUM PO Take 250 mg by mouth daily.   meclizine 25 MG tablet Commonly known as: ANTIVERT Take 1 tablet (25 mg total) by mouth 3 (three) times daily as needed (vertigo).   metoprolol succinate 50 MG 24 hr tablet Commonly known as: TOPROL-XL Take 1 tablet (50 mg total) by mouth daily.   montelukast 10 MG tablet Commonly known as: SINGULAIR Take 1 tablet (10 mg total) by mouth at bedtime.   multivitamin tablet Take 1 tablet by mouth daily.   mupirocin ointment 2 % Commonly known as:  BACTROBAN Apply topically.   nystatin 500000 units Tabs tablet Commonly known as: MYCOSTATIN Take 1 tablet (500,000 Units total) by mouth in the morning, at noon, in the evening, and at bedtime.   ondansetron 4 MG disintegrating tablet Commonly known as: Zofran ODT Take 1 tablet (4 mg total) by mouth every 8 (eight) hours as needed for nausea or vomiting.   OneTouch Delica Lancets 40J Misc Used to check blood sugars twice daly.   OneTouch Verio High Soln Used to check accuracy of blood glucose machine.   OneTouch Verio test strip Generic drug: glucose blood Used to check blood sugars 3 times daily.   Premarin vaginal cream Generic drug: conjugated estrogens Place vaginally 2 (two) times a week. INSERT 0.5 APPLICATORS FULL VAGINALLY TWICE PER WEEK. AT BEDTIME.   Propylene Glycol 0.6 % Soln Apply 1 drop to eye as needed.   psyllium 58.6 % powder Commonly known as: METAMUCIL Take 1 packet by mouth as needed.   Spiriva Respimat 1.25 MCG/ACT Aers Generic drug: Tiotropium Bromide Monohydrate Inhale 2 puffs into the lungs daily.   STOOL SOFTENER PO Take 1 tablet by mouth as needed.   SYRINGE 3CC/20GX1" 20G X 1" 3 ML Misc 1 Syringe by Does not apply route every 30 (thirty) days. For use with b-12 injection monthly   tretinoin 0.05 % cream Commonly known as: RETIN-A Apply topically at bedtime.   urea 10 % cream Commonly known as: CARMOL Apply topically as needed.   Vitamin D3 50 MCG (2000 UT) Tabs Take 1 tablet by mouth daily.        Signature:  Chesley Mires, MD [place] Pager - [phone] 07/08/2022, 5:13 PM

## 2022-07-08 NOTE — Patient Instructions (Signed)
Try stopping symbicort  Continue spiriva two puffs daily  Use flonase 1 spray in each nostril daily  Use astepro 2 sprays in each nostril nightly  Magic mouth wash 5 ml every 8 hours as needed  Follow up in 2 weeks with Dr. Halford Chessman

## 2022-07-08 NOTE — Progress Notes (Signed)
Subjective:    Patient ID: Angela Mendoza, female    DOB: Dec 30, 1950, 71 y.o.   MRN: [id number]  CC: Angela Mendoza is a 71 y.o. female who presents today for an acute visit.    HPI: Complains of dysuria x 5 days, symptoms intermittent however worsened last night. Took OTC phenazopyridine, Uristat.   Endorses chills, low back dull ache, somewhat more right sided. She will have to maneuver her lower body to fully empty bladder.    No fever, hematuria, flank pain, N, V  She is compliant with pyridium.   No h/o renal stone.   H/o hypertension, paroxysmal atrial fibrillation, diabetes, atrophic vaginitis  GFR 58 one month ago  Allergic to sulfa.  Planning surgery uterine prolapse, planning surgery later this month.   Urine culture 02/2022 with proteus mirabilis. Treated with cipro.   HISTORY:  Past Medical History:  Diagnosis Date   Agatston coronary artery calcium score less than 100    a. 06/2018 Cardiac CT: Ca2+ = 3 (49th %'ile).   Allergic rhinitis 06/09/2008   chronic   Aortic atherosclerosis ([place])    a. 06/2018 noted on chest CT.   Asthma, intrinsic 10/06/2011   controlled on qvar. [name] 09/2011:  Very mild airflow obstruction (FEV1% 68, FVL c/w obstruction)    Basal cell carcinoma 06/10/2021   right prox nasal ala rim - MOHs 07/12/21 Angela Mendoza   Chronic headache 06/09/2008   COPD (chronic obstructive pulmonary disease) ([place])    Depression    Diverticulosis 2013   h/o diverticulitis   Dry eyes    GERD (gastroesophageal reflux disease)    History of breast implant removal    silicone mastitis - R axilla silicone LN, free silicone L breast upper outer quadrant   Hot flashes    HYPERLIPIDEMIA 06/09/2008   HYPERTENSION 06/09/2008   Hypothyroidism    MVP (mitral valve prolapse)    OSA on CPAP    [name]   PAF (paroxysmal atrial fibrillation) ([place]) 06/09/2008   a. CHA2DS2VASc = 3-->prefers ASA.   Pernicious anemia    per prior pcp   Rosacea    Surgical  menopause 1991   Vertigo    Vitamin D deficiency    Past Surgical History:  Procedure Laterality Date   APPENDECTOMY  2007   BREAST BIOPSY Left 09/27/2009   BREAST ENHANCEMENT SURGERY  2006   CARDIOVASCULAR STRESS TEST  2013   Angela Mendoza - WNL, EF 55-60%, with 0 calcium score   CHOLECYSTECTOMY  2007   biliary dyskinesia   COLONOSCOPY  08/2012   diverticulosis   COLONOSCOPY  2006   inflamed hyperplastic polyp   ESOPHAGOGASTRODUODENOSCOPY  08/2012   esophageal reflux   HERNIA REPAIR     NASAL SINUS SURGERY     RECTOCELE REPAIR  12/2008   Angela Mendoza   sleep study  08/2007   OSA, 12 cm H2O,   TOTAL ABDOMINAL HYSTERECTOMY  1991   heavy bleeding, ovaries removed   TUBAL LIGATION     US ECHOCARDIOGRAPHY  09/2009   impaired relaxation, mild tric regurg, normal MV, EF 60%   US ECHOCARDIOGRAPHY  11/2007   mild-mod MR   Family History  Problem Relation Age of Onset   Cancer Mother 71       lung, passive smoke   Cancer Father 19       lung, smoker   COPD Sister    Stroke Sister        TIA  Hypertension Sister    Heart failure Sister    Aortic dissection Maternal Aunt    Breast cancer Maternal Aunt    Cancer Paternal Aunt        breast, lung   Aortic dissection Paternal Uncle    Heart attack Paternal Uncle    AAA (abdominal aortic aneurysm) Paternal Uncle    Heart disease Paternal Uncle    Stroke Paternal Uncle    Cancer Cousin        breast    Allergies: Codeine, Qvar [beclomethasone], and Sulfonamide derivatives Current Outpatient Medications on File Prior to Visit  Medication Sig Dispense Refill   albuterol (PROVENTIL HFA) 108 (90 Base) MCG/ACT inhaler Inhale 2 puffs into the lungs every 6 (six) hours as needed. 18 g 2   ALPRAZolam (XANAX) 0.25 MG tablet TAKE 1 TABLET (0.25 MG TOTAL) BY MOUTH ONCE DAILY AS NEEDED FOR SLEEP. 30 tablet 5   atorvastatin (LIPITOR) 40 MG tablet Take 1 tablet (40 mg total) by mouth daily. 90 tablet 3   azelastine (ASTELIN) 0.1 %  nasal spray Place 2 sprays into both nostrils 2 (two) times daily. Use in each nostril as directed 90 mL 3   benzonatate (TESSALON) 200 MG capsule Take 1 capsule (200 mg total) by mouth 3 (three) times daily as needed for cough. 30 capsule 1   Blood Glucose Calibration (ONETOUCH VERIO) High SOLN Used to check accuracy of blood glucose machine. 1 each 0   budesonide-formoterol (SYMBICORT) 160-4.5 MCG/ACT inhaler Inhale 2 puffs into the lungs in the morning and at bedtime. 90 each 3   Cholecalciferol (VITAMIN D3) 50 MCG (2000 UT) TABS Take 1 tablet by mouth daily. 90 tablet 3   Coenzyme Q10 (COQ10 PO) Take by mouth daily.     conjugated estrogens (PREMARIN) vaginal cream Place vaginally 2 (two) times a week. INSERT 0.5 APPLICATORS FULL VAGINALLY TWICE PER WEEK. AT BEDTIME. 30 g 4   cyanocobalamin (VITAMIN B12) 1000 MCG/ML injection Inject 1 mL (1,000 mcg total) into the muscle every 30 (thirty) days. 3 mL 3   cycloSPORINE (RESTASIS) 0.05 % ophthalmic emulsion Place 1 drop into both eyes 2 (two) times daily. 3 each 3   Docusate Calcium (STOOL SOFTENER PO) Take 1 tablet by mouth as needed.     EPINEPHrine 0.3 mg/0.3 mL IJ SOAJ injection 0.3 mg by injection route. 1 each 1   estradiol (VIVELLE-DOT) 0.1 MG/24HR patch Place 1 patch (0.1 mg total) onto the skin 2 (two) times a week. 24 patch 3   famotidine (PEPCID) 20 MG tablet Take 1 tablet (20 mg total) by mouth daily as needed for heartburn or indigestion. 90 tablet 3   fexofenadine (ALLEGRA ALLERGY) 180 MG tablet Take 1 tablet (180 mg total) by mouth daily. 90 tablet 3   fluticasone (FLONASE) 50 MCG/ACT nasal spray Place 1 spray into both nostrils daily. 48 g 3   glucose blood (ONETOUCH VERIO) test strip Used to check blood sugars 3 times daily. 100 each 12   levothyroxine (SYNTHROID) 50 MCG tablet Take 1 tablet (50 mcg total) by mouth daily before breakfast. 90 tablet 3   losartan (COZAAR) 50 MG tablet Take 1 tablet (50 mg total) by mouth at bedtime.  90 tablet 3   MAGNESIUM PO Take 250 mg by mouth daily.     meclizine (ANTIVERT) 25 MG tablet Take 1 tablet (25 mg total) by mouth 3 (three) times daily as needed (vertigo). 84 tablet 3   metoprolol succinate (TOPROL-XL) 50 MG  24 hr tablet Take 1 tablet (50 mg total) by mouth daily. 90 tablet 3   montelukast (SINGULAIR) 10 MG tablet Take 1 tablet (10 mg total) by mouth at bedtime. 90 tablet 3   Multiple Vitamin (MULTIVITAMIN) tablet Take 1 tablet by mouth daily.     mupirocin ointment (BACTROBAN) 2 % Apply topically.     nystatin (MYCOSTATIN) 500000 units TABS tablet Take 1 tablet (500,000 Units total) by mouth in the morning, at noon, in the evening, and at bedtime. 60 tablet 0   ondansetron (ZOFRAN ODT) 4 MG disintegrating tablet Take 1 tablet (4 mg total) by mouth every 8 (eight) hours as needed for nausea or vomiting. 30 tablet 2   OneTouch Delica Lancets 96E MISC Used to check blood sugars twice daly. 100 each 5   Propylene Glycol 0.6 % SOLN Apply 1 drop to eye as needed.     psyllium (METAMUCIL) 58.6 % powder Take 1 packet by mouth as needed.     Syringe/Needle, Disp, (SYRINGE 3CC/20GX1") 20G X 1" 3 ML MISC 1 Syringe by Does not apply route every 30 (thirty) days. For use with b-12 injection monthly 50 each 0   Tiotropium Bromide Monohydrate (SPIRIVA RESPIMAT) 1.25 MCG/ACT AERS Inhale 2 puffs into the lungs daily. 12 g 3   tretinoin (RETIN-A) 0.05 % cream Apply topically at bedtime. 45 g 1   urea (CARMOL) 10 % cream Apply topically as needed. 71 g 3   chlorpheniramine (CHLOR-TRIMETON) 4 MG tablet Take 1 tablet (4 mg total) by mouth at bedtime. (Patient not taking: Reported on 07/08/2022) 90 tablet 3   No current facility-administered medications on file prior to visit.    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 Hx of children who are grown  Vaping Use   Vaping Use: Never used  Substance Use Topics   Alcohol use: Not Currently    Alcohol/week: 0.0 standard drinks of alcohol   Drug use: No    Review of Systems  Constitutional:  Negative for chills and fever.  Respiratory:  Negative for cough.   Cardiovascular:  Negative for chest pain and palpitations.  Gastrointestinal:  Negative for nausea and vomiting.  Genitourinary:  Positive for dysuria.  Musculoskeletal:  Positive for back pain.      Objective:    BP 132/78 (BP Location: Left Arm, Patient Position: Sitting, Cuff Size: Normal)   Pulse 94   Temp 97.9 F (36.6 C) (Oral)   Ht '5\' 5"'$  (1.651 m)   Wt 181 lb 9.6 oz (82.4 kg)   SpO2 98%   BMI 30.22 kg/m   BP Readings from Last 3 Encounters:  07/08/22 132/78  07/04/22 128/72  06/10/22 (!) 142/68    Physical Exam Vitals reviewed.  Constitutional:      Appearance: She is well-developed.  Cardiovascular:     Rate and Rhythm: Normal rate and regular rhythm.     Pulses: Normal pulses.     Heart sounds: Normal heart sounds.  Pulmonary:     Effort: Pulmonary effort is normal.     Breath sounds: Normal breath sounds. No wheezing, rhonchi or rales.  Musculoskeletal:  Legs:     Comments: Diffuse low back pain.    Skin:    General: Skin is warm and dry.  Neurological:     Mental Status: She is alert.  Psychiatric:        Speech: Speech normal.        Behavior: Behavior normal.        Thought Content: Thought content normal.        Assessment & Plan:   Problem List Items Addressed This Visit       Other   Dysuria - Primary    Patient afebrile and nontoxic in appearance.  Urine point-of-care shows positive nitrites, leukocytes.  Based on duration of symptoms, chills we have opted to treat with Augmentin ahead of urine culture.  Discussed at length history of uterine prolapse and  upcoming surgery with uro-GYN as I suspect contributory as patient has difficulty emptying her bladder.  She also has a history of atrophic vaginitis.  Patient will continue monitor blood sugar at home.  She will remain on probiotics.  Patient will let me know how she is doing while we await urine culture      Relevant Medications   amoxicillin-clavulanate (AUGMENTIN) 875-125 MG tablet   Other Relevant Orders   Urinalysis, Routine w reflex microscopic   Urine Culture   POCT Urinalysis Dipstick (Completed)   Other Visit Diagnoses     UTI symptoms       Relevant Orders   Urinalysis, Routine w reflex microscopic   Urine Culture   POCT Urinalysis Dipstick (Completed)         I am having Joaquim Lai A. Hauser start on amoxicillin-clavulanate. I am also having her maintain her Propylene Glycol, psyllium, Docusate Calcium (STOOL SOFTENER PO), fexofenadine, multivitamin, MAGNESIUM PO, cycloSPORINE, Coenzyme Q10 (COQ10 PO), Vitamin D3, SYRINGE 3CC/20GX1", mupirocin ointment, chlorpheniramine, azelastine, Spiriva Respimat, fluticasone, losartan, levothyroxine, metoprolol succinate, montelukast, albuterol, atorvastatin, meclizine, famotidine, ondansetron, estradiol, Premarin, cyanocobalamin, tretinoin, EPINEPHrine, budesonide-formoterol, benzonatate, nystatin, ALPRAZolam, urea, OneTouch Verio, OneTouch Verio, and OneTouch Delica Lancets 78L.   Meds ordered this encounter  Medications   amoxicillin-clavulanate (AUGMENTIN) 875-125 MG tablet    Sig: Take 1 tablet by mouth 2 (two) times daily for 7 days.    Dispense:  14 tablet    Refill:  0    Order Specific Question:   Supervising Provider    Answer:   Crecencio Mc [[id number]]    Return precautions given.   Risks, benefits, and alternatives of the medications and treatment plan prescribed today were discussed, and patient expressed understanding.   Education regarding symptom management and diagnosis given to patient on AVS.  Continue to follow  with Crecencio Mc, MD for routine health maintenance.   Angela Mendoza and I agreed with plan.   [name], FNP

## 2022-07-08 NOTE — Assessment & Plan Note (Addendum)
Patient afebrile and nontoxic in appearance.  Urine point-of-care shows positive nitrites, leukocytes.  Based on duration of symptoms, chills we have opted to treat with Augmentin ahead of urine culture.  Discussed at length history of uterine prolapse and upcoming surgery with uro-GYN as I suspect contributory as patient has difficulty emptying her bladder.  She also has a history of atrophic vaginitis.  Patient will continue monitor blood sugar at home.  She will remain on probiotics.  Patient will let me know how she is doing while we await urine culture

## 2022-07-08 NOTE — Patient Instructions (Addendum)
As discussed, please start Augmentin, biotic.  Please ensure you continue probiotics as discussed.  I will certainly notify you via MyChart if antibiotic needs to be changed over the weekend.  Please let me know of any acute concerns.  Dysuria Dysuria is pain or discomfort during urination. The pain or discomfort may be felt in the part of the body that drains urine from the bladder (urethra) or in the surrounding tissue of the genitals. The pain may also be felt in the groin area, lower abdomen, or lower back. You may have to urinate frequently or have the sudden feeling that you have to urinate (urgency). Dysuria can affect anyone, but it is more common in females. Dysuria can be caused by many different things, including: Urinary tract infection. Kidney stones or bladder stones. Certain STIs (sexually transmitted infections), such as chlamydia. Dehydration. Inflammation of the tissues of the vagina. Use of certain medicines. Use of certain soaps or scented products that cause irritation. Follow these instructions at home: Medicines Take over-the-counter and prescription medicines only as told by your health care provider. If you were prescribed an antibiotic medicine, take it as told by your health care provider. Do not stop taking the antibiotic even if you start to feel better. Eating and drinking  Drink enough fluid to keep your urine pale yellow. Avoid caffeinated beverages, tea, and alcohol. These beverages can irritate the bladder and make dysuria worse. In males, alcohol may irritate the prostate. General instructions Watch your condition for any changes. Urinate often. Avoid holding urine for long periods of time. If you are female, you should wipe from front to back after urinating or having a bowel movement. Use each piece of toilet paper only once. Empty your bladder after sex. Keep all follow-up visits. This is important. If you had any tests done to find the cause of  dysuria, it is up to you to get your test results. Ask your health care provider, or the department that is doing the test, when your results will be ready. Contact a health care provider if: You have a fever. You develop pain in your back or sides. You have nausea or vomiting. You have blood in your urine. You are not urinating as often as you usually do. Get help right away if: Your pain is severe and not relieved with medicines. You cannot eat or drink without vomiting. You are confused. You have a rapid heartbeat while resting. You have shaking or chills. You feel extremely weak. Summary Dysuria is pain or discomfort while urinating. Many different conditions can lead to dysuria. If you have dysuria, you may have to urinate frequently or have the sudden feeling that you have to urinate (urgency). Watch your condition for any changes. Keep all follow-up visits. Make sure that you urinate often and drink enough fluid to keep your urine pale yellow. This information is not intended to replace advice given to you by your health care provider. Make sure you discuss any questions you have with your health care provider. Document Revised: 05/25/2020 Document Reviewed: 05/25/2020 Elsevier Patient Education  [place].

## 2022-07-09 ENCOUNTER — Telehealth: Payer: Self-pay | Admitting: Pulmonary Disease

## 2022-07-09 ENCOUNTER — Other Ambulatory Visit: Payer: Self-pay | Admitting: Primary Care

## 2022-07-09 NOTE — Telephone Encounter (Signed)
Meagan can you help?

## 2022-07-09 NOTE — Telephone Encounter (Signed)
[facility] calling back for formula. Please call them back at [phone]

## 2022-07-10 NOTE — Telephone Encounter (Signed)
50 ml of 2% lidocaine, 50 ml of mylanta, 50 ml of diphenhydramine at 12.5 mg per 5 ml, 50 ml of nystatin at 100,000 units per 5 ml.  She should rinse with 5 ml of this mixture 3 times per day as needed.

## 2022-07-10 NOTE — Telephone Encounter (Signed)
I called the pharmacy and [facility] wants to know what formula for the mouthwash such as how much lidocaine, Etc? Please advise.

## 2022-07-11 ENCOUNTER — Ambulatory Visit (INDEPENDENT_AMBULATORY_CARE_PROVIDER_SITE_OTHER): Payer: Medicare Other | Admitting: Podiatry

## 2022-07-11 ENCOUNTER — Encounter: Payer: Self-pay | Admitting: Pulmonary Disease

## 2022-07-11 DIAGNOSIS — M722 Plantar fascial fibromatosis: Secondary | ICD-10-CM

## 2022-07-11 LAB — URINE CULTURE
MICRO NUMBER:: 13905616
SPECIMEN QUALITY:: ADEQUATE

## 2022-07-11 NOTE — Telephone Encounter (Signed)
I called the pharmacy and gave them the recommendations and there was nothing further needed.

## 2022-07-13 NOTE — Progress Notes (Signed)
Chief Complaint  Patient presents with   Follow-up    The patient is here for follow-up on right foot, she is wearing the brace that was given also she states that the injections work and relieved the pain in the right foot.    Subjective: 71 y.o. female presenting to the office today for follow up evaluation of left heel pain has been going on for few months now. Patient states that she is feeling significantly better.  No new complaints at this time   Past Medical History:  Diagnosis Date   Agatston coronary artery calcium score less than 100    a. 06/2018 Cardiac CT: Ca2+ = 3 (49th %'ile).   Allergic rhinitis 06/09/2008   chronic   Aortic atherosclerosis ([place])    a. 06/2018 noted on chest CT.   Asthma, intrinsic 10/06/2011   controlled on qvar. [name] 09/2011:  Very mild airflow obstruction (FEV1% 68, FVL c/w obstruction)    Basal cell carcinoma 06/10/2021   right prox nasal ala rim - MOHs 07/12/21 Dr. Lacinda Axon   Chronic headache 06/09/2008   COPD (chronic obstructive pulmonary disease) ([place])    Depression    Diverticulosis 2013   h/o diverticulitis   Dry eyes    GERD (gastroesophageal reflux disease)    History of breast implant removal    silicone mastitis - R axilla silicone LN, free silicone L breast upper outer quadrant   Hot flashes    HYPERLIPIDEMIA 06/09/2008   HYPERTENSION 06/09/2008   Hypothyroidism    MVP (mitral valve prolapse)    OSA on CPAP    [name]   PAF (paroxysmal atrial fibrillation) ([place]) 06/09/2008   a. CHA2DS2VASc = 3-->prefers ASA.   Pernicious anemia    per prior pcp   Rosacea    Surgical menopause 1991   Vertigo    Vitamin D deficiency      Objective: Physical Exam General: The patient is alert and oriented x3 in no acute distress.  Dermatology: Skin is warm, dry and supple bilateral lower extremities. Negative for open lesions or macerations bilateral.   Vascular: Dorsalis Pedis and Posterior Tibial pulses palpable bilateral.  Capillary  fill time is immediate to all digits.  Neurological: Epicritic and protective threshold intact bilateral.   Musculoskeletal: Tenderness to palpation to the plantar aspect of the left heel along the plantar fascia. All other joints range of motion within normal limits bilateral. Strength 5/5 in all groups bilateral.   Radiographic exam LT foot 06/06/2022: Normal osseous mineralization. Joint spaces preserved. No fracture/dislocation/boney destruction. No other soft tissue abnormalities or radiopaque foreign bodies.   Assessment: 1. Plantar fasciitis left foot  Plan of Care:  1. Patient evaluated.   2.  Overall the patient is doing significantly better.  No pain or tenderness associated to the plan fascia 3.  Recommend daily stretching exercises  4.  Continue meloxicam 15 mg daily as needed 5.  Continue plantar fascial brace as needed 6.  Continue to advise against going barefoot.  Recommend good supportive shoes and sneakers throughout the day 7.  Patient pending custom molded orthotics.  Prescription for custom molded orthotics to take to [facility] was dispensed last visit.  She has not gone yet 8.  Return to clinic in 4 weeks.     Edrick Kins, DPM [facility]  Dr. Edrick Kins, DPM    [address]  [place], [place] [address]                Office [phone]  Fax [phone]

## 2022-07-14 ENCOUNTER — Telehealth: Payer: Self-pay

## 2022-07-14 ENCOUNTER — Telehealth: Payer: Self-pay | Admitting: Internal Medicine

## 2022-07-14 ENCOUNTER — Other Ambulatory Visit: Payer: Self-pay | Admitting: Family

## 2022-07-14 ENCOUNTER — Encounter: Payer: Self-pay | Admitting: Family Medicine

## 2022-07-14 ENCOUNTER — Other Ambulatory Visit (INDEPENDENT_AMBULATORY_CARE_PROVIDER_SITE_OTHER): Payer: Medicare Other

## 2022-07-14 ENCOUNTER — Ambulatory Visit (INDEPENDENT_AMBULATORY_CARE_PROVIDER_SITE_OTHER): Payer: Medicare Other | Admitting: Family Medicine

## 2022-07-14 VITALS — BP 138/84 | HR 82 | Temp 97.9°F | Ht 65.0 in | Wt 185.4 lb

## 2022-07-14 DIAGNOSIS — R300 Dysuria: Secondary | ICD-10-CM | POA: Diagnosis not present

## 2022-07-14 DIAGNOSIS — R399 Unspecified symptoms and signs involving the genitourinary system: Secondary | ICD-10-CM | POA: Diagnosis not present

## 2022-07-14 DIAGNOSIS — K644 Residual hemorrhoidal skin tags: Secondary | ICD-10-CM | POA: Insufficient documentation

## 2022-07-14 DIAGNOSIS — R8290 Unspecified abnormal findings in urine: Secondary | ICD-10-CM

## 2022-07-14 LAB — POCT URINALYSIS DIPSTICK
Bilirubin, UA: NEGATIVE
Blood, UA: NEGATIVE
Glucose, UA: NEGATIVE
Ketones, UA: NEGATIVE
Leukocytes, UA: NEGATIVE
Nitrite, UA: NEGATIVE
Protein, UA: NEGATIVE
Spec Grav, UA: 1.01 (ref 1.010–1.025)
Urobilinogen, UA: 0.2 E.U./dL
pH, UA: 6 (ref 5.0–8.0)

## 2022-07-14 MED ORDER — ONETOUCH VERIO VI STRP: ORAL_STRIP | 3 refills | Status: DC

## 2022-07-14 MED ORDER — CIPROFLOXACIN HCL 250 MG PO TABS: 250.0000 mg | ORAL_TABLET | Freq: Two times a day (BID) | ORAL | 0 refills | Status: AC

## 2022-07-14 NOTE — Patient Instructions (Signed)
Nice to see you. The general surgery office should contact you for an appointment. Please start on the ciprofloxacin.  If your symptoms are not improving with taking this please let us know.

## 2022-07-14 NOTE — Telephone Encounter (Signed)
Refilled

## 2022-07-14 NOTE — Assessment & Plan Note (Signed)
Patient has had recurrence of symptoms despite being on appropriate antibiotics with Augmentin.  She has an allergy to sulfa.  Her prior urine culture was resistant to Macrobid.  Her urinalysis today is negative though she is currently on an antibiotic.  Given her recurrence of symptoms while on Augmentin we will switch her over to ciprofloxacin.  I did discuss the risk of tendon rupture and neuropathy with this kind of medication.  If her symptoms or not improving she will let us know.  We will send her urine for culture.

## 2022-07-14 NOTE — Telephone Encounter (Signed)
-----   Message from Suzanna Obey, [place] sent at 07/14/2022  1:44 PM EDT ----- Regarding: Verio test strips Patient asked that I ask you to send her verio test strips to [facility] instead of [facility]. Patient did state that she picked them up this time.

## 2022-07-14 NOTE — Telephone Encounter (Signed)
Noted She was seen by dr Caryl Bis today and changed to cipro

## 2022-07-14 NOTE — Assessment & Plan Note (Signed)
Refer to general surgery for evaluation of removal.

## 2022-07-14 NOTE — Progress Notes (Signed)
Angela Rumps, MD Phone: [phone]  Angela Mendoza is a 71 y.o. female who presents today for same day visit.   UTI: Patient was treated for UTI with Augmentin last week.  She notes over the last couple days she is starting to have worsening symptoms again.  She has dysuria and feels lower abdominal pressure sensation.  She notes urinary frequency and urgency.  No hematuria.  No vaginal discharge.  She has subtle back pain.  Patient reports her rectal skin tag has been visualized by her urogynecologist.  The patient wonders if she should see a surgeon to have that removed and wonders if that could be contributing to why she has had a couple of UTIs recently.  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 18 g 2   ALPRAZolam (XANAX) 0.25 MG tablet TAKE 1 TABLET (0.25 MG TOTAL) BY MOUTH ONCE DAILY AS NEEDED FOR SLEEP. 30 tablet 5   amoxicillin-clavulanate (AUGMENTIN) 875-125 MG tablet Take 1 tablet by mouth 2 (two) times daily for 7 days. 14 tablet 0   atorvastatin (LIPITOR) 40 MG tablet Take 1 tablet (40 mg total) by mouth daily. 90 tablet 3   azelastine (ASTELIN) 0.1 % nasal spray Place 2 sprays into both nostrils 2 (two) times daily. Use in each nostril as directed 90 mL 3   benzonatate (TESSALON) 200 MG capsule Take 1 capsule (200 mg total) by mouth 3 (three) times daily as needed for cough. 30 capsule 1   Blood Glucose Calibration (ONETOUCH VERIO) High SOLN Used to check accuracy of blood glucose machine. 1 each  0   budesonide-formoterol (SYMBICORT) 160-4.5 MCG/ACT inhaler Inhale 2 puffs into the lungs in the morning and at bedtime. 90 each 3   chlorpheniramine (CHLOR-TRIMETON) 4 MG tablet Take 1 tablet (4 mg total) by mouth at bedtime. 90 tablet 3   Cholecalciferol (VITAMIN D3) 50 MCG (2000 UT) TABS Take 1 tablet by mouth daily. 90 tablet 3   Coenzyme Q10 (COQ10 PO) Take by mouth daily.     conjugated estrogens (PREMARIN) vaginal cream Place vaginally 2 (two) times a week. INSERT 0.5 APPLICATORS FULL VAGINALLY TWICE PER WEEK. AT BEDTIME. 30 g 4   cyanocobalamin (VITAMIN B12) 1000 MCG/ML injection Inject 1 mL (1,000 mcg total) into the muscle every 30 (thirty) days. 3 mL 3   cycloSPORINE (RESTASIS) 0.05 % ophthalmic emulsion Place 1 drop into both eyes 2 (two) times daily. 3 each 3   Docusate Calcium (STOOL SOFTENER PO) Take 1 tablet by mouth as needed.     EPINEPHrine 0.3 mg/0.3 mL IJ SOAJ injection 0.3 mg by injection route. 1 each 1   estradiol (VIVELLE-DOT) 0.1 MG/24HR patch Place 1 patch (0.1 mg total) onto the skin 2 (two) times a week. 24 patch 3   famotidine (PEPCID) 20 MG tablet Take 1 tablet (20 mg total) by mouth daily as needed for heartburn or indigestion. 90 tablet 3   fexofenadine (ALLEGRA ALLERGY) 180 MG  tablet Take 1 tablet (180 mg total) by mouth daily. 90 tablet 3   fluticasone (FLONASE) 50 MCG/ACT nasal spray Place 1 spray into both nostrils daily. 48 g 3   glucose blood (ONETOUCH VERIO) test strip Used to check blood sugars 3 times daily. 100 each 12   levothyroxine (SYNTHROID) 50 MCG tablet Take 1 tablet (50 mcg total) by mouth daily before breakfast. 90 tablet 3   losartan (COZAAR) 50 MG tablet Take 1 tablet (50 mg total) by mouth at bedtime. 90 tablet 3   magic mouthwash w/lidocaine SOLN Take 5 mLs by mouth 3 (three) times daily as needed for mouth pain. 150 mL 1   MAGNESIUM PO Take 250 mg by mouth daily.     meclizine (ANTIVERT) 25 MG tablet Take 1 tablet (25 mg total) by mouth 3  (three) times daily as needed (vertigo). 84 tablet 3   metoprolol succinate (TOPROL-XL) 50 MG 24 hr tablet Take 1 tablet (50 mg total) by mouth daily. 90 tablet 3   montelukast (SINGULAIR) 10 MG tablet Take 1 tablet (10 mg total) by mouth at bedtime. 90 tablet 3   Multiple Vitamin (MULTIVITAMIN) tablet Take 1 tablet by mouth daily.     mupirocin ointment (BACTROBAN) 2 % Apply topically.     nystatin (MYCOSTATIN) 500000 units TABS tablet Take 1 tablet (500,000 Units total) by mouth in the morning, at noon, in the evening, and at bedtime. 60 tablet 0   ondansetron (ZOFRAN ODT) 4 MG disintegrating tablet Take 1 tablet (4 mg total) by mouth every 8 (eight) hours as needed for nausea or vomiting. 30 tablet 2   OneTouch Delica Lancets 94W MISC Used to check blood sugars twice daly. 100 each 5   Propylene Glycol 0.6 % SOLN Apply 1 drop to eye as needed.     psyllium (METAMUCIL) 58.6 % powder Take 1 packet by mouth as needed.     Syringe/Needle, Disp, (SYRINGE 3CC/20GX1") 20G X 1" 3 ML MISC 1 Syringe by Does not apply route every 30 (thirty) days. For use with b-12 injection monthly 50 each 0   Tiotropium Bromide Monohydrate (SPIRIVA RESPIMAT) 1.25 MCG/ACT AERS Inhale 2 puffs into the lungs daily. 12 g 3   tretinoin (RETIN-A) 0.05 % cream Apply topically at bedtime. 45 g 1   urea (CARMOL) 10 % cream Apply topically as needed. 71 g 3   No current facility-administered medications on file prior to visit.     ROS see history of present illness  Objective  Physical Exam Vitals:   07/14/22 1336  BP: 138/84  Pulse: 82  Temp: 97.9 F (36.6 C)  SpO2: 97%    BP Readings from Last 3 Encounters:  07/14/22 138/84  07/08/22 132/76  07/08/22 132/78   Wt Readings from Last 3 Encounters:  07/14/22 185 lb 6.4 oz (84.1 kg)  07/08/22 183 lb 9.6 oz (83.3 kg)  07/08/22 181 lb 9.6 oz (82.4 kg)    Physical Exam Abdominal:     General: Bowel sounds are normal. There is no distension.     Palpations:  Abdomen is soft.     Tenderness: There is abdominal tenderness (Mild suprapubic tenderness). There is no guarding.      Assessment/Plan: Please see individual problem list.  Problem List Items Addressed This Visit     Dysuria - Primary    Patient has had recurrence of symptoms despite being on appropriate antibiotics with Augmentin.  She has an allergy to sulfa.  Her prior urine  culture was resistant to Macrobid.  Her urinalysis today is negative though she is currently on an antibiotic.  Given her recurrence of symptoms while on Augmentin we will switch her over to ciprofloxacin.  I did discuss the risk of tendon rupture and neuropathy with this kind of medication.  If her symptoms or not improving she will let us know.  We will send her urine for culture.      Relevant Medications   ciprofloxacin (CIPRO) 250 MG tablet   Other Relevant Orders   Urine Culture   Skin tag of anus    Refer to general surgery for evaluation of removal.      Relevant Orders   Ambulatory referral to General Surgery    Return if symptoms worsen or fail to improve.   Angela Rumps, MD [place]

## 2022-07-14 NOTE — Telephone Encounter (Signed)
Pt called stating she thinks her bladder infection is coming back and she only has two pills left. Pt stated she saw arnett for this issue

## 2022-07-15 LAB — URINE CULTURE
MICRO NUMBER:: 13931208
Result:: NO GROWTH
SPECIMEN QUALITY:: ADEQUATE

## 2022-07-16 ENCOUNTER — Encounter: Payer: Self-pay | Admitting: Family

## 2022-07-18 ENCOUNTER — Ambulatory Visit
Admission: RE | Admit: 2022-07-18 | Discharge: 2022-07-18 | Disposition: A | Discharge status: Home / Self Care | Payer: Medicare Other | Source: Ambulatory Visit | Attending: Family | Admitting: Family

## 2022-07-18 ENCOUNTER — Encounter: Payer: Self-pay | Admitting: Family

## 2022-07-18 ENCOUNTER — Ambulatory Visit (INDEPENDENT_AMBULATORY_CARE_PROVIDER_SITE_OTHER): Payer: Medicare Other | Admitting: Family

## 2022-07-18 VITALS — BP 138/70 | HR 64 | Temp 98.4°F | Ht 65.0 in | Wt 185.6 lb

## 2022-07-18 DIAGNOSIS — R079 Chest pain, unspecified: Secondary | ICD-10-CM

## 2022-07-18 DIAGNOSIS — M7989 Other specified soft tissue disorders: Secondary | ICD-10-CM

## 2022-07-18 DIAGNOSIS — R0602 Shortness of breath: Secondary | ICD-10-CM | POA: Diagnosis not present

## 2022-07-18 LAB — POCT I-STAT CREATININE: Creatinine, Ser: 0.8 mg/dL (ref 0.44–1.00)

## 2022-07-18 MED ORDER — IOHEXOL 350 MG/ML SOLN
75.0000 mL | Freq: Once | INTRAVENOUS | Status: AC | PRN
  Administered 2022-07-18: 75 mL via INTRAVENOUS

## 2022-07-18 NOTE — Telephone Encounter (Signed)
Error

## 2022-07-18 NOTE — Assessment & Plan Note (Addendum)
Acute on chronic.  No acute respiratory distress.  History of asthma.  She has not been using albuterol as she gets thrush.  She is following with pulmonology.  Pending CT angio to rule out pulmonary embolus.  EKG shows sinus rhythm.  Negative T waves are unchanged from previous EKG 05/09/22.  Patient is maintaining sinus rhythm. Consider updating echocardiogram. Will appreciate Dr Juanetta Gosling advice regarding optimizing asthma regimen when he sees her next week.

## 2022-07-18 NOTE — Progress Notes (Signed)
Subjective:    Patient ID: Angela Mendoza, female    DOB: Jan 18, 1951, 71 y.o.   MRN: [id number]  CC: Angela Mendoza is a 71 y.o. female who presents today for an acute visit.    HPI: Complains of bilateral leg swelling for over a week.  She had noticed episodic left foot swelling previous however leg swelling has been sudden in onset.    Left posterior knee pain after fall on steps 2 years ago.   She is completed Augmentin for UTI.  Denies fever, dysuria, vertigo, palpitations, CP, syncope  Chronic cough and SOB with exertion . Compliant with Spiriva, Singulair. She is not using albuterol as causes thrush.  She also reports episodic.  Some dizziness in the past few weeks for example when she was standing while shopping.  No associated syncope, palpitation.  She drinks plenty of water  No recent travel.  No history of blood clot.   She is walking for exercise.   H/o asthma . She follows with Dr Halford Chessman with follow-up scheduled 07/23/2022.      Echocardiogram 01/23/2021 showed left ventricular action fraction 60 to 65%.  Grade 2 diastolic dysfunction History of paroxysmal atrial fibrillation-she is compliant  metoprolol succinate 50 mg.  She is not on anticoagulant  DM- compliant with losartan 50 mg. She manages with diet. FBG 106.   Remote smoking history, quit 15+ years ago  CXR 06/10/22 which was normal.   She has h/o vertigo  Follow-up cardiology, or [facility] 05/09/2022.  No changes to regimen at that time.  Follow-up pulmonology 07/08/2022, Dr. Halford Chessman.  Advised to stop Symbicort and continue Spiriva, Singulair.  Recurrent thrush.  Changed to azelastine.  HISTORY:  Past Medical History:  Diagnosis Date   Agatston coronary artery calcium score less than 100    a. 06/2018 Cardiac CT: Ca2+ = 3 (49th %'ile).   Allergic rhinitis 06/09/2008   chronic   Aortic atherosclerosis ([place])    a. 06/2018 noted on chest CT.   Asthma, intrinsic 10/06/2011   controlled on qvar. [name]  09/2011:  Very mild airflow obstruction (FEV1% 68, FVL c/w obstruction)    Basal cell carcinoma 06/10/2021   right prox nasal ala rim - MOHs 07/12/21 Dr. Lacinda Axon   Chronic headache 06/09/2008   COPD (chronic obstructive pulmonary disease) ([place])    Depression    Diverticulosis 2013   h/o diverticulitis   Dry eyes    GERD (gastroesophageal reflux disease)    History of breast implant removal    silicone mastitis - R axilla silicone LN, free silicone L breast upper outer quadrant   Hot flashes    HYPERLIPIDEMIA 06/09/2008   HYPERTENSION 06/09/2008   Hypothyroidism    MVP (mitral valve prolapse)    OSA on CPAP    [name]   PAF (paroxysmal atrial fibrillation) ([place]) 06/09/2008   a. CHA2DS2VASc = 3-->prefers ASA.   Pernicious anemia    per prior pcp   Rosacea    Surgical menopause 1991   Vertigo    Vitamin D deficiency    Past Surgical History:  Procedure Laterality Date   APPENDECTOMY  2007   BREAST BIOPSY Left 09/27/2009   BREAST ENHANCEMENT SURGERY  2006   CARDIOVASCULAR STRESS TEST  2013   [facility] [name] - WNL, EF 55-60%, with 0 calcium score   CHOLECYSTECTOMY  2007   biliary dyskinesia   COLONOSCOPY  08/2012   diverticulosis   COLONOSCOPY  2006   inflamed hyperplastic polyp  ESOPHAGOGASTRODUODENOSCOPY  08/2012   esophageal reflux   HERNIA REPAIR     NASAL SINUS SURGERY     RECTOCELE REPAIR  12/2008   [name]   sleep study  08/2007   OSA, 12 cm H2O,   TOTAL ABDOMINAL HYSTERECTOMY  1991   heavy bleeding, ovaries removed   TUBAL LIGATION     US ECHOCARDIOGRAPHY  09/2009   impaired relaxation, mild tric regurg, normal MV, EF 60%   US ECHOCARDIOGRAPHY  11/2007   mild-mod MR   Family History  Problem Relation Age of Onset   Cancer Mother 30       lung, passive smoke   Cancer Father 104       lung, smoker   COPD Sister    Stroke Sister        TIA   Hypertension Sister    Heart failure Sister    Aortic dissection Maternal Aunt    Breast cancer Maternal  Aunt    Cancer Paternal Aunt        breast, lung   Aortic dissection Paternal Uncle    Heart attack Paternal Uncle    AAA (abdominal aortic aneurysm) Paternal Uncle    Heart disease Paternal Uncle    Stroke Paternal Uncle    Cancer Cousin        breast    Allergies: Codeine, Qvar [beclomethasone], and Sulfonamide derivatives Current Outpatient Medications on File Prior to Visit  Medication Sig Dispense Refill   albuterol (PROVENTIL HFA) 108 (90 Base) MCG/ACT inhaler Inhale 2 puffs into the lungs every 6 (six) hours as needed. 18 g 2   ALPRAZolam (XANAX) 0.25 MG tablet TAKE 1 TABLET (0.25 MG TOTAL) BY MOUTH ONCE DAILY AS NEEDED FOR SLEEP. 30 tablet 5   atorvastatin (LIPITOR) 40 MG tablet Take 1 tablet (40 mg total) by mouth daily. 90 tablet 3   azelastine (ASTELIN) 0.1 % nasal spray Place 2 sprays into both nostrils 2 (two) times daily. Use in each nostril as directed 90 mL 3   benzonatate (TESSALON) 200 MG capsule Take 1 capsule (200 mg total) by mouth 3 (three) times daily as needed for cough. 30 capsule 1   Blood Glucose Calibration (ONETOUCH VERIO) High SOLN Used to check accuracy of blood glucose machine. 1 each 0   budesonide-formoterol (SYMBICORT) 160-4.5 MCG/ACT inhaler Inhale 2 puffs into the lungs in the morning and at bedtime. 90 each 3   chlorpheniramine (CHLOR-TRIMETON) 4 MG tablet Take 1 tablet (4 mg total) by mouth at bedtime. 90 tablet 3   Cholecalciferol (VITAMIN D3) 50 MCG (2000 UT) TABS Take 1 tablet by mouth daily. 90 tablet 3   Coenzyme Q10 (COQ10 PO) Take by mouth daily.     conjugated estrogens (PREMARIN) vaginal cream Place vaginally 2 (two) times a week. INSERT 0.5 APPLICATORS FULL VAGINALLY TWICE PER WEEK. AT BEDTIME. 30 g 4   cyanocobalamin (VITAMIN B12) 1000 MCG/ML injection Inject 1 mL (1,000 mcg total) into the muscle every 30 (thirty) days. 3 mL 3   cycloSPORINE (RESTASIS) 0.05 % ophthalmic emulsion Place 1 drop into both eyes 2 (two) times daily. 3 each 3    Docusate Calcium (STOOL SOFTENER PO) Take 1 tablet by mouth as needed.     EPINEPHrine 0.3 mg/0.3 mL IJ SOAJ injection 0.3 mg by injection route. 1 each 1   estradiol (VIVELLE-DOT) 0.1 MG/24HR patch Place 1 patch (0.1 mg total) onto the skin 2 (two) times a week. 24 patch 3   famotidine (  PEPCID) 20 MG tablet Take 1 tablet (20 mg total) by mouth daily as needed for heartburn or indigestion. 90 tablet 3   fexofenadine (ALLEGRA ALLERGY) 180 MG tablet Take 1 tablet (180 mg total) by mouth daily. 90 tablet 3   fluticasone (FLONASE) 50 MCG/ACT nasal spray Place 1 spray into both nostrils daily. 48 g 3   glucose blood (ONETOUCH VERIO) test strip Used to check blood sugars 3 times daily. 300 each 3   levothyroxine (SYNTHROID) 50 MCG tablet Take 1 tablet (50 mcg total) by mouth daily before breakfast. 90 tablet 3   losartan (COZAAR) 50 MG tablet Take 1 tablet (50 mg total) by mouth at bedtime. 90 tablet 3   magic mouthwash w/lidocaine SOLN Take 5 mLs by mouth 3 (three) times daily as needed for mouth pain. 150 mL 1   MAGNESIUM PO Take 250 mg by mouth daily.     meclizine (ANTIVERT) 25 MG tablet Take 1 tablet (25 mg total) by mouth 3 (three) times daily as needed (vertigo). 84 tablet 3   metoprolol succinate (TOPROL-XL) 50 MG 24 hr tablet Take 1 tablet (50 mg total) by mouth daily. 90 tablet 3   montelukast (SINGULAIR) 10 MG tablet Take 1 tablet (10 mg total) by mouth at bedtime. 90 tablet 3   Multiple Vitamin (MULTIVITAMIN) tablet Take 1 tablet by mouth daily.     mupirocin ointment (BACTROBAN) 2 % Apply topically.     nystatin (MYCOSTATIN) 500000 units TABS tablet Take 1 tablet (500,000 Units total) by mouth in the morning, at noon, in the evening, and at bedtime. 60 tablet 0   ondansetron (ZOFRAN ODT) 4 MG disintegrating tablet Take 1 tablet (4 mg total) by mouth every 8 (eight) hours as needed for nausea or vomiting. 30 tablet 2   OneTouch Delica Lancets 16S MISC Used to check blood sugars twice daly.  100 each 5   Propylene Glycol 0.6 % SOLN Apply 1 drop to eye as needed.     psyllium (METAMUCIL) 58.6 % powder Take 1 packet by mouth as needed.     Syringe/Needle, Disp, (SYRINGE 3CC/20GX1") 20G X 1" 3 ML MISC 1 Syringe by Does not apply route every 30 (thirty) days. For use with b-12 injection monthly 50 each 0   Tiotropium Bromide Monohydrate (SPIRIVA RESPIMAT) 1.25 MCG/ACT AERS Inhale 2 puffs into the lungs daily. 12 g 3   tretinoin (RETIN-A) 0.05 % cream Apply topically at bedtime. 45 g 1   urea (CARMOL) 10 % cream Apply topically as needed. 71 g 3   No current facility-administered medications on file prior to visit.    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  Respiratory:  Positive for cough and shortness of breath.   Cardiovascular:  Positive for leg swelling. Negative for chest pain and palpitations.  Gastrointestinal:  Negative for nausea  and vomiting.  Neurological:  Positive for dizziness. Negative for syncope.      Objective:    BP 138/70   Pulse 64   Temp 98.4 F (36.9 C) (Oral)   Ht '5\' 5"'$  (1.651 m)   Wt 185 lb 9.6 oz (84.2 kg)   SpO2 96%   BMI 30.89 kg/m    Physical Exam Vitals reviewed.  Constitutional:      Appearance: She is well-developed.  Eyes:     Conjunctiva/sclera: Conjunctivae normal.  Cardiovascular:     Rate and Rhythm: Normal rate and regular rhythm.     Pulses: Normal pulses.     Heart sounds: Normal heart sounds.     Comments:  Nonpitting + BLE edema. No palpable cords or masses. No erythema or increased warmth. No asymmetry in calf size when compared bilaterally LE hair growth symmetric and present. No discoloration or varicosities noted. LE warm and palpable pedal pulses.  Pulmonary:     Effort: Pulmonary effort is normal.     Breath sounds: Normal breath sounds. No wheezing, rhonchi or rales.  Skin:    General: Skin is warm and dry.  Neurological:     Mental Status: She is alert.  Psychiatric:        Speech: Speech normal.        Behavior: Behavior normal.        Thought Content: Thought content normal.        Assessment & Plan:   Problem List Items Addressed This Visit       Other   Leg swelling - Primary    She is not orthostatic on exam today ( see flow sheet).  Previous blood pressures have been well controlled at home 117/80.  Past couple days BP appears escalated.  Advised as opposed to increasing losartan or starting HCTZ to monitor blood pressure.  Start compression stockings, low-sodium diet, and elevation.  In the past ,  intolerance to HCTZ so we would likely avoid diuretics if we can. Pending stat ultrasound to evaluate for DVT. Close follow up.       Relevant Orders   CT Angio Chest W/Cm &/Or Wo Cm   US Venous Img Lower Bilateral   EKG 12-Lead (Completed)   SOB (shortness of breath)    Acute on chronic.  No acute respiratory distress.  History of asthma.  She has not been using albuterol as she gets thrush.  She is following with pulmonology.  Pending CT angio to rule out pulmonary embolus.  EKG shows sinus rhythm.  Negative T waves are unchanged from previous EKG 05/09/22.  Patient is maintaining sinus rhythm. Consider updating echocardiogram. Will appreciate Dr Juanetta Gosling advice regarding optimizing asthma regimen when he sees her next week.         Other Visit Diagnoses     Chest pain, unspecified type       Relevant Orders   EKG 12-Lead (Completed)          I am having  Joaquim Lai A. Volanda Napoleon maintain her Propylene Glycol, psyllium, Docusate Calcium (STOOL SOFTENER PO), fexofenadine, multivitamin, MAGNESIUM PO, cycloSPORINE, Coenzyme Q10 (COQ10 PO), Vitamin D3, SYRINGE 3CC/20GX1", mupirocin ointment, chlorpheniramine, azelastine, Spiriva Respimat, fluticasone, losartan, levothyroxine, metoprolol succinate, montelukast, albuterol, atorvastatin, meclizine, famotidine, ondansetron, estradiol, Premarin, cyanocobalamin, tretinoin, EPINEPHrine, budesonide-formoterol, benzonatate, nystatin, ALPRAZolam, urea, OneTouch Verio, OneTouch Delica Lancets 88C, magic mouthwash w/lidocaine, and OneTouch Verio.   No orders of the defined types were placed in this encounter.   Return precautions given.  Risks, benefits, and alternatives of the medications and treatment plan prescribed today were discussed, and patient expressed understanding.   Education regarding symptom management and diagnosis given to patient on AVS.  Continue to follow with Crecencio Mc, MD for routine health maintenance.   Angela Mendoza and I agreed with plan.   [name], FNP

## 2022-07-18 NOTE — Patient Instructions (Addendum)
I ordered stat CT angio to evaluate your lungs and also exclude pulmonary embolus I have ordered stat ultrasound of your legs as well.    For leg swelling, please wear compression stockings during the day.  You may take them off at bedtime.  I recommend 20 mmHg. Elevate your legs above the level of your heart as discussed. Low-sodium diet  Please keep follow-up Dr. Halford Chessman next week and I will also see you for follow-up in 2 weeks time   Peripheral Edema  Peripheral edema is swelling that is caused by a buildup of fluid. Peripheral edema most often affects the lower legs, ankles, and feet. It can also develop in the arms, hands, and face. The area of the body that has peripheral edema will look swollen. It may also feel heavy or warm. Your clothes may start to feel tight. Pressing on the area may make a temporary dent in your skin (pitting edema). You may not be able to move your swollen arm or leg as much as usual. There are many causes of peripheral edema. It can happen because of a complication of other conditions such as heart failure, kidney disease, or a problem with your circulation. It also can be a side effect of certain medicines or happen because of an infection. It often happens to women during pregnancy. Sometimes, the cause is not known. Follow these instructions at home: Managing pain, stiffness, and swelling  Raise (elevate) your legs while you are sitting or lying down. Move around often to prevent stiffness and to reduce swelling. Do not sit or stand for long periods of time. Do not wear tight clothing. Do not wear garters on your upper legs. Exercise your legs to get your circulation going. This helps to move the fluid back into your blood vessels, and it may help the swelling go down. Wear compression stockings as told by your health care provider. These stockings help to prevent blood clots and reduce swelling in your legs. It is important that these are the correct size.  These stockings should be prescribed by your doctor to prevent possible injuries. If elastic bandages or wraps are recommended, use them as told by your health care provider. Medicines Take over-the-counter and prescription medicines only as told by your health care provider. Your health care provider may prescribe medicine to help your body get rid of excess water (diuretic). Take this medicine if you are told to take it. General instructions Eat a low-salt (low-sodium) diet as told by your health care provider. Sometimes, eating less salt may reduce swelling. Pay attention to any changes in your symptoms. Moisturize your skin daily to help prevent skin from cracking and draining. Keep all follow-up visits. This is important. Contact a health care provider if: You have a fever. You have swelling in only one leg. You have increased swelling, redness, or pain in one or both of your legs. You have drainage or sores at the area where you have edema. Get help right away if: You have edema that starts suddenly or is getting worse, especially if you are pregnant or have a medical condition. You develop shortness of breath, especially when you are lying down. You have pain in your chest or abdomen. You feel weak. You feel like you will faint. These symptoms may be an emergency. Get help right away. Call 911. Do not wait to see if the symptoms will go away. Do not drive yourself to the hospital. Summary Peripheral edema is swelling that is  caused by a buildup of fluid. Peripheral edema most often affects the lower legs, ankles, and feet. Move around often to prevent stiffness and to reduce swelling. Do not sit or stand for long periods of time. Pay attention to any changes in your symptoms. Contact a health care provider if you have edema that starts suddenly or is getting worse, especially if you are pregnant or have a medical condition. Get help right away if you develop shortness of breath,  especially when lying down. This information is not intended to replace advice given to you by your health care provider. Make sure you discuss any questions you have with your health care provider. Document Revised: 06/17/2021 Document Reviewed: 06/17/2021 Elsevier Patient Education  [place].

## 2022-07-18 NOTE — Assessment & Plan Note (Addendum)
She is not orthostatic on exam today ( see flow sheet).  Previous blood pressures have been well controlled at home 117/80.  Past couple days BP appears escalated.  Advised as opposed to increasing losartan or starting HCTZ to monitor blood pressure.  Start compression stockings, low-sodium diet, and elevation.  In the past ,  intolerance to HCTZ so we would likely avoid diuretics if we can. Pending stat ultrasound to evaluate for DVT. Close follow up.

## 2022-07-18 NOTE — Progress Notes (Signed)
Subjective:    Patient ID: Angela Mendoza, female    DOB: [birth year], 71 y.o.   MRN: [id number]  CC: Angela Mendoza is a 71 y.o. female who presents today for an acute visit.    HPI: HPI Urinary symptoms have resolved on Augmentin.  She did not start ciprofloxacin.   HISTORY:  Past Medical History:  Diagnosis Date   [place] coronary artery calcium score less than 100    a. 06/2018 Cardiac CT: Ca2+ = 3 (49th %'ile).   Allergic rhinitis 06/09/2008   chronic   Aortic atherosclerosis ([place])    a. 06/2018 noted on chest CT.   Asthma, intrinsic 10/06/2011   controlled on qvar. [name] 09/2011:  Very mild airflow obstruction (FEV1% 68, FVL c/w obstruction)    Basal cell carcinoma 06/10/2021   right prox nasal ala rim - MOHs 07/12/21 Dr. Lacinda Axon   Chronic headache 06/09/2008   COPD (chronic obstructive pulmonary disease) ([place])    Depression    Diverticulosis 2013   h/o diverticulitis   Dry eyes    GERD (gastroesophageal reflux disease)    History of breast implant removal    silicone mastitis - R axilla silicone LN, free silicone L breast upper outer quadrant   Hot flashes    HYPERLIPIDEMIA 06/09/2008   HYPERTENSION 06/09/2008   Hypothyroidism    MVP (mitral valve prolapse)    OSA on CPAP    [name]   PAF (paroxysmal atrial fibrillation) ([place]) 06/09/2008   a. CHA2DS2VASc = 3-->prefers ASA.   Pernicious anemia    per prior pcp   Rosacea    Surgical menopause 1991   Vertigo    Vitamin D deficiency    Past Surgical History:  Procedure Laterality Date   APPENDECTOMY  2007   BREAST BIOPSY Left 09/27/2009   BREAST ENHANCEMENT SURGERY  2006   CARDIOVASCULAR STRESS TEST  2013   [place] [name] - WNL, EF 55-60%, with 0 calcium score   CHOLECYSTECTOMY  2007   biliary dyskinesia   COLONOSCOPY  08/2012   diverticulosis   COLONOSCOPY  2006   inflamed hyperplastic polyp   ESOPHAGOGASTRODUODENOSCOPY  08/2012   esophageal reflux   HERNIA REPAIR     NASAL SINUS SURGERY      RECTOCELE REPAIR  12/2008   [name]   sleep study  08/2007   OSA, 12 cm H2O,   TOTAL ABDOMINAL HYSTERECTOMY  1991   heavy bleeding, ovaries removed   TUBAL LIGATION     US ECHOCARDIOGRAPHY  09/2009   impaired relaxation, mild tric regurg, normal MV, EF 60%   US ECHOCARDIOGRAPHY  11/2007   mild-mod MR   Family History  Problem Relation Age of Onset   Cancer Mother 47       lung, passive smoke   Cancer Father 70       lung, smoker   COPD Sister    Stroke Sister        TIA   Hypertension Sister    Heart failure Sister    Aortic dissection Maternal Aunt    Breast cancer Maternal Aunt    Cancer Paternal Aunt        breast, lung   Aortic dissection Paternal Uncle    Heart attack Paternal Uncle    AAA (abdominal aortic aneurysm) Paternal Uncle    Heart disease Paternal Uncle    Stroke Paternal Uncle    Cancer Cousin        breast    Allergies:  Codeine, Qvar [beclomethasone], and Sulfonamide derivatives Current Outpatient Medications on File Prior to Visit  Medication Sig Dispense Refill   albuterol (PROVENTIL HFA) 108 (90 Base) MCG/ACT inhaler Inhale 2 puffs into the lungs every 6 (six) hours as needed. 18 g 2   ALPRAZolam (XANAX) 0.25 MG tablet TAKE 1 TABLET (0.25 MG TOTAL) BY MOUTH ONCE DAILY AS NEEDED FOR SLEEP. 30 tablet 5   atorvastatin (LIPITOR) 40 MG tablet Take 1 tablet (40 mg total) by mouth daily. 90 tablet 3   azelastine (ASTELIN) 0.1 % nasal spray Place 2 sprays into both nostrils 2 (two) times daily. Use in each nostril as directed 90 mL 3   benzonatate (TESSALON) 200 MG capsule Take 1 capsule (200 mg total) by mouth 3 (three) times daily as needed for cough. 30 capsule 1   Blood Glucose Calibration (ONETOUCH VERIO) High SOLN Used to check accuracy of blood glucose machine. 1 each 0   budesonide-formoterol (SYMBICORT) 160-4.5 MCG/ACT inhaler Inhale 2 puffs into the lungs in the morning and at bedtime. 90 each 3   chlorpheniramine (CHLOR-TRIMETON) 4 MG tablet Take  1 tablet (4 mg total) by mouth at bedtime. 90 tablet 3   Cholecalciferol (VITAMIN D3) 50 MCG (2000 UT) TABS Take 1 tablet by mouth daily. 90 tablet 3   Coenzyme Q10 (COQ10 PO) Take by mouth daily.     conjugated estrogens (PREMARIN) vaginal cream Place vaginally 2 (two) times a week. INSERT 0.5 APPLICATORS FULL VAGINALLY TWICE PER WEEK. AT BEDTIME. 30 g 4   cyanocobalamin (VITAMIN B12) 1000 MCG/ML injection Inject 1 mL (1,000 mcg total) into the muscle every 30 (thirty) days. 3 mL 3   cycloSPORINE (RESTASIS) 0.05 % ophthalmic emulsion Place 1 drop into both eyes 2 (two) times daily. 3 each 3   Docusate Calcium (STOOL SOFTENER PO) Take 1 tablet by mouth as needed.     EPINEPHrine 0.3 mg/0.3 mL IJ SOAJ injection 0.3 mg by injection route. 1 each 1   estradiol (VIVELLE-DOT) 0.1 MG/24HR patch Place 1 patch (0.1 mg total) onto the skin 2 (two) times a week. 24 patch 3   famotidine (PEPCID) 20 MG tablet Take 1 tablet (20 mg total) by mouth daily as needed for heartburn or indigestion. 90 tablet 3   fexofenadine (ALLEGRA ALLERGY) 180 MG tablet Take 1 tablet (180 mg total) by mouth daily. 90 tablet 3   fluticasone (FLONASE) 50 MCG/ACT nasal spray Place 1 spray into both nostrils daily. 48 g 3   glucose blood (ONETOUCH VERIO) test strip Used to check blood sugars 3 times daily. 300 each 3   levothyroxine (SYNTHROID) 50 MCG tablet Take 1 tablet (50 mcg total) by mouth daily before breakfast. 90 tablet 3   losartan (COZAAR) 50 MG tablet Take 1 tablet (50 mg total) by mouth at bedtime. 90 tablet 3   magic mouthwash w/lidocaine SOLN Take 5 mLs by mouth 3 (three) times daily as needed for mouth pain. 150 mL 1   MAGNESIUM PO Take 250 mg by mouth daily.     meclizine (ANTIVERT) 25 MG tablet Take 1 tablet (25 mg total) by mouth 3 (three) times daily as needed (vertigo). 84 tablet 3   metoprolol succinate (TOPROL-XL) 50 MG 24 hr tablet Take 1 tablet (50 mg total) by mouth daily. 90 tablet 3   montelukast  (SINGULAIR) 10 MG tablet Take 1 tablet (10 mg total) by mouth at bedtime. 90 tablet 3   Multiple Vitamin (MULTIVITAMIN) tablet Take 1 tablet by mouth  daily.     mupirocin ointment (BACTROBAN) 2 % Apply topically.     nystatin (MYCOSTATIN) 500000 units TABS tablet Take 1 tablet (500,000 Units total) by mouth in the morning, at noon, in the evening, and at bedtime. 60 tablet 0   ondansetron (ZOFRAN ODT) 4 MG disintegrating tablet Take 1 tablet (4 mg total) by mouth every 8 (eight) hours as needed for nausea or vomiting. 30 tablet 2   OneTouch Delica Lancets 30Z MISC Used to check blood sugars twice daly. 100 each 5   Propylene Glycol 0.6 % SOLN Apply 1 drop to eye as needed.     psyllium (METAMUCIL) 58.6 % powder Take 1 packet by mouth as needed.     Syringe/Needle, Disp, (SYRINGE 3CC/20GX1") 20G X 1" 3 ML MISC 1 Syringe by Does not apply route every 30 (thirty) days. For use with b-12 injection monthly 50 each 0   Tiotropium Bromide Monohydrate (SPIRIVA RESPIMAT) 1.25 MCG/ACT AERS Inhale 2 puffs into the lungs daily. 12 g 3   tretinoin (RETIN-A) 0.05 % cream Apply topically at bedtime. 45 g 1   urea (CARMOL) 10 % cream Apply topically as needed. 71 g 3   No current facility-administered medications on file prior to visit.    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 Hx of children who are grown  Vaping Use   Vaping Use: Never used  Substance Use Topics   Alcohol use: Not Currently    Alcohol/week: 0.0 standard drinks of alcohol   Drug use: No    Review of Systems    Objective:    There were no vitals taken for this visit.   Physical Exam     Assessment & Plan:      I am having Joaquim Lai A. Volanda Napoleon maintain her Propylene  Glycol, psyllium, Docusate Calcium (STOOL SOFTENER PO), fexofenadine, multivitamin, MAGNESIUM PO, cycloSPORINE, Coenzyme Q10 (COQ10 PO), Vitamin D3, SYRINGE 3CC/20GX1", mupirocin ointment, chlorpheniramine, azelastine, Spiriva Respimat, fluticasone, losartan, levothyroxine, metoprolol succinate, montelukast, albuterol, atorvastatin, meclizine, famotidine, ondansetron, estradiol, Premarin, cyanocobalamin, tretinoin, EPINEPHrine, budesonide-formoterol, benzonatate, nystatin, ALPRAZolam, urea, OneTouch Verio, OneTouch Delica Lancets 60F, magic mouthwash w/lidocaine, and OneTouch Verio.   No orders of the defined types were placed in this encounter.   Return precautions given.   Risks, benefits, and alternatives of the medications and treatment plan prescribed today were discussed, and patient expressed understanding.   Education regarding symptom management and diagnosis given to patient on AVS.  Continue to follow with Crecencio Mc, MD for routine health maintenance.   Angela Mendoza and I agreed with plan.   [name], FNP

## 2022-07-23 ENCOUNTER — Encounter: Payer: Self-pay | Admitting: Pulmonary Disease

## 2022-07-23 ENCOUNTER — Ambulatory Visit (INDEPENDENT_AMBULATORY_CARE_PROVIDER_SITE_OTHER): Payer: Medicare Other | Admitting: Pulmonary Disease

## 2022-07-23 VITALS — BP 120/90 | HR 76 | Ht 65.0 in | Wt 184.4 lb

## 2022-07-23 DIAGNOSIS — J310 Chronic rhinitis: Secondary | ICD-10-CM

## 2022-07-23 DIAGNOSIS — R058 Other specified cough: Secondary | ICD-10-CM

## 2022-07-23 DIAGNOSIS — G4733 Obstructive sleep apnea (adult) (pediatric): Secondary | ICD-10-CM

## 2022-07-23 DIAGNOSIS — J45909 Unspecified asthma, uncomplicated: Secondary | ICD-10-CM

## 2022-07-23 MED ORDER — IPRATROPIUM BROMIDE 0.03 % NA SOLN: 2.0000 | Freq: Three times a day (TID) | NASAL | 12 refills | Status: DC | PRN

## 2022-07-23 MED ORDER — CHLORPHENIRAMINE MALEATE 4 MG PO TABS: 4.0000 mg | ORAL_TABLET | Freq: Every day | ORAL | 0 refills | Status: DC

## 2022-07-23 NOTE — Patient Instructions (Signed)
Atrovent nasal spray every 8 hours as needed for runny nose  Chlorpheniramine 4 mg pill nightly  Will have [employer] change your auto CPAP setting to 4 - 8 cm water pressure  Follow up in 3 to 4 weeks with Dr. Halford Chessman or a nurse practitioner

## 2022-07-23 NOTE — Progress Notes (Signed)
[facility]  Chief Complaint  Patient presents with   Follow-up    Constitutional:  BP (!) 120/90 (BP Location: Right Arm)   Pulse 76   Ht '5\' 5"'$  (1.651 m)   Wt 184 lb 6.4 oz (83.6 kg)   SpO2 97%   BMI 30.69 kg/m   Past Medical History:  Vit D deficiency, Vertigo, Rosacea, Hypothyroidism, HTN, HLD, GERD, Diverticulosis, Depression, HA, COVID 12 February 2021  Past Surgical History:  She  has a past surgical history that includes Cholecystectomy (2007); Appendectomy (2007); Tubal ligation; Total abdominal hysterectomy (1991); Nasal sinus surgery; Breast enhancement surgery (2006); Cardiovascular stress test (2013); Colonoscopy (08/2012); Esophagogastroduodenoscopy (08/2012); US ECHOCARDIOGRAPHY (09/2009); US ECHOCARDIOGRAPHY (11/2007); sleep study (08/2007); Rectocele repair (12/2008); Colonoscopy (2006); Breast biopsy (Left, 09/27/2009); and Hernia repair.  Brief Summary:  Angela Mendoza is a 71 y.o. female former smoker with OSA and asthma.      Subjective:   She is here with her husband.  She had CT chest with PCP.  Showed mild dependent atelectasis.  She has sinus and ear pressure.  Gets drainage especially when she lays flat.  This triggers a cough.  She has more cough at home and is worried if she had cat dander and dust still in her home carpeting.    She hasn't noticed her breathing getting worse since she stopped symbicort.    She uses CPAP nightly.  Physical Exam:   Appearance - well kempt   ENMT - no sinus tenderness, no oral exudate, no LAN, Mallampati 3 airway, no stridor  Respiratory - equal breath sounds bilaterally, no wheezing or rales  CV - s1s2 regular rate and rhythm, no murmurs  Ext - no clubbing, no edema  Skin - no rashes  Psych - normal mood and affect      Pulmonary testing:  Spirometry 12/12 >> FEV1% 68 RAST 11/15/14 >> IgE 30, cats/mold PFT 02/07/16 >> FEV1 2.32 (92%), FEV1% 76, FEF 25-75%  1.56 (71%), TLC 4.25 (81%), DLCO 121, borderline BD from FEF 25-75 FeNO 12/01/18 >> 7 IgE 12/16/21 >> 23  Chest Imaging:  Coronary CT 04/04/21 >> stable 2 mm nodule LLL, coronary calcium score 0 CT sinus 01/09/22 >> normal CT angio chest 07/18/22 >> mild dependent atelectasis  Sleep Tests:  PSG 09/06/07 >> AHI 19 CPAP 06/07/22 to 07/06/22 >> used on 30 of 30 nights with average 7 hrs 25 min.  Average AHI 0.5 with median CPAP 7 and 95 th percentile CPAP 9 cm H2O  Cardiac Tests:  Echo 01/23/21 >> EF 60 to 65%, grade 2 DD, RVSP 39.1 mmHg, mild MR  Social History:  She  reports that she quit smoking about 44 years ago. Her smoking use included cigarettes. She has a 2.10 pack-year smoking history. She has never used smokeless tobacco. She reports that she does not currently use alcohol. She reports that she does not use drugs.  Family History:  Her family history includes AAA (abdominal aortic aneurysm) in her paternal uncle; Aortic dissection in her maternal aunt and paternal uncle; Breast cancer in her maternal aunt; COPD in her sister; Cancer in her cousin and paternal aunt; Cancer (age of onset: 86) in her father; Cancer (age of onset: 58) in her mother; Heart attack in her paternal uncle; Heart disease in her paternal uncle; Heart failure in her sister; Hypertension in her sister; Stroke in her paternal uncle and sister.     Assessment/Plan:   Allergic asthma. -  she gets scripts through [facility] stopped due to throat irritation - continue spiriva respimat and singulair - prn albuterol - discussed COVID, RSV, and influenza vaccines  Recurrent thrush. - from ICS inhalers - nystatin doesn't clear her infection; usually needs diflucan - hopefully won't be much of an issue since she is not longer using ICS inhalers   Upper airway cough with allergic rhinitis. - this seems to be main cause of her cough at present - dysmista caused funny taste - continue allegra during the  day - continue flonase daily, azelastine bid, singulair qhs - add chlorpheniramine 4 mg nightly - tessalon 200 mg tid prn - will add atrovent nasal spray tid prn   Obstructive sleep apnea. - she is compliant with CPAP and reports benefit from therapy - uses [employer] for her DME - current CPAP ordered May 2022 - will change auto CPAP to 4 - 8 cm H2O  Colonoscopy. - explained there are no pulmonary restrictions for her to proceed with colonoscopy  Vaginal prolapse. - surgery tentatively planned for November 2023 - will hopefully have cough better controlled by then  Time Spent Involved in Patient Care on Day of Examination:  38 minutes  Follow up:   Patient Instructions  Atrovent nasal spray every 8 hours as needed for runny nose  Chlorpheniramine 4 mg pill nightly  Will have [employer] change your auto CPAP setting to 4 - 8 cm water pressure  Follow up in 3 to 4 weeks with Dr. Halford Chessman or a nurse practitioner  Medication List:   Allergies as of 07/23/2022       Reactions   Codeine Other (See Comments)   Low blood pressure/nausea   Qvar [beclomethasone] Other (See Comments)   Thrush per patient even with rinsing.    Sulfonamide Derivatives Rash   rash        Medication List        Accurate as of July 23, 2022 12:47 PM. If you have any questions, ask your nurse or doctor.          STOP taking these medications    budesonide-formoterol 160-4.5 MCG/ACT inhaler Commonly known as: Symbicort Stopped by: Chesley Mires, MD   nystatin 500000 units Tabs tablet Commonly known as: MYCOSTATIN Stopped by: Chesley Mires, MD       TAKE these medications    albuterol 108 (90 Base) MCG/ACT inhaler Commonly known as: Proventil HFA Inhale 2 puffs into the lungs every 6 (six) hours as needed.   ALPRAZolam 0.25 MG tablet Commonly known as: XANAX TAKE 1 TABLET (0.25 MG TOTAL) BY MOUTH ONCE DAILY AS NEEDED FOR SLEEP.   atorvastatin 40 MG tablet Commonly known as:  LIPITOR Take 1 tablet (40 mg total) by mouth daily.   azelastine 0.1 % nasal spray Commonly known as: ASTELIN Place 2 sprays into both nostrils 2 (two) times daily. Use in each nostril as directed   benzonatate 200 MG capsule Commonly known as: TESSALON Take 1 capsule (200 mg total) by mouth 3 (three) times daily as needed for cough.   chlorpheniramine 4 MG tablet Commonly known as: Chlorphen Take 1 tablet (4 mg total) by mouth at bedtime.   COQ10 PO Take by mouth daily.   cyanocobalamin 1000 MCG/ML injection Commonly known as: VITAMIN B12 Inject 1 mL (1,000 mcg total) into the muscle every 30 (thirty) days.   cycloSPORINE 0.05 % ophthalmic emulsion Commonly known as: RESTASIS Place 1 drop into both eyes 2 (two) times daily.  EPINEPHrine 0.3 mg/0.3 mL Soaj injection Commonly known as: EPI-PEN 0.3 mg by injection route.   estradiol 0.1 MG/24HR patch Commonly known as: VIVELLE-DOT Place 1 patch (0.1 mg total) onto the skin 2 (two) times a week.   famotidine 20 MG tablet Commonly known as: PEPCID Take 1 tablet (20 mg total) by mouth daily as needed for heartburn or indigestion.   fexofenadine 180 MG tablet Commonly known as: Allegra Allergy Take 1 tablet (180 mg total) by mouth daily.   fluticasone 50 MCG/ACT nasal spray Commonly known as: FLONASE Place 1 spray into both nostrils daily.   ipratropium 0.03 % nasal spray Commonly known as: ATROVENT Place 2 sprays into both nostrils 3 (three) times daily as needed for rhinitis. Started by: Chesley Mires, MD   levothyroxine 50 MCG tablet Commonly known as: Synthroid Take 1 tablet (50 mcg total) by mouth daily before breakfast.   losartan 50 MG tablet Commonly known as: COZAAR Take 1 tablet (50 mg total) by mouth at bedtime.   magic mouthwash w/lidocaine Soln Take 5 mLs by mouth 3 (three) times daily as needed for mouth pain.   MAGNESIUM PO Take 250 mg by mouth daily.   meclizine 25 MG tablet Commonly known  as: ANTIVERT Take 1 tablet (25 mg total) by mouth 3 (three) times daily as needed (vertigo).   metoprolol succinate 50 MG 24 hr tablet Commonly known as: TOPROL-XL Take 1 tablet (50 mg total) by mouth daily.   montelukast 10 MG tablet Commonly known as: SINGULAIR Take 1 tablet (10 mg total) by mouth at bedtime.   multivitamin tablet Take 1 tablet by mouth daily.   mupirocin ointment 2 % Commonly known as: BACTROBAN Apply topically.   ondansetron 4 MG disintegrating tablet Commonly known as: Zofran ODT Take 1 tablet (4 mg total) by mouth every 8 (eight) hours as needed for nausea or vomiting.   OneTouch Delica Lancets 33A Misc Used to check blood sugars twice daly.   OneTouch Verio High Soln Used to check accuracy of blood glucose machine.   OneTouch Verio test strip Generic drug: glucose blood Used to check blood sugars 3 times daily.   Premarin vaginal cream Generic drug: conjugated estrogens Place vaginally 2 (two) times a week. INSERT 0.5 APPLICATORS FULL VAGINALLY TWICE PER WEEK. AT BEDTIME.   Propylene Glycol 0.6 % Soln Apply 1 drop to eye as needed.   psyllium 58.6 % powder Commonly known as: METAMUCIL Take 1 packet by mouth as needed.   Spiriva Respimat 1.25 MCG/ACT Aers Generic drug: Tiotropium Bromide Monohydrate Inhale 2 puffs into the lungs daily.   STOOL SOFTENER PO Take 1 tablet by mouth as needed.   SYRINGE 3CC/20GX1" 20G X 1" 3 ML Misc 1 Syringe by Does not apply route every 30 (thirty) days. For use with b-12 injection monthly   tretinoin 0.05 % cream Commonly known as: RETIN-A Apply topically at bedtime.   urea 10 % cream Commonly known as: CARMOL Apply topically as needed.   Vitamin D3 50 MCG (2000 UT) Tabs Take 1 tablet by mouth daily.        Signature:  Chesley Mires, MD [place] Pager - [phone] 07/23/2022, 12:47 PM

## 2022-07-24 ENCOUNTER — Encounter: Payer: Self-pay | Admitting: Internal Medicine

## 2022-07-25 ENCOUNTER — Encounter: Payer: Self-pay | Admitting: Internal Medicine

## 2022-07-25 ENCOUNTER — Ambulatory Visit (INDEPENDENT_AMBULATORY_CARE_PROVIDER_SITE_OTHER): Payer: Medicare Other | Admitting: Internal Medicine

## 2022-07-25 VITALS — BP 140/84 | HR 81 | Temp 98.2°F | Ht 65.0 in | Wt 184.2 lb

## 2022-07-25 DIAGNOSIS — N8110 Cystocele, unspecified: Secondary | ICD-10-CM

## 2022-07-25 DIAGNOSIS — R300 Dysuria: Secondary | ICD-10-CM | POA: Diagnosis not present

## 2022-07-25 DIAGNOSIS — N952 Postmenopausal atrophic vaginitis: Secondary | ICD-10-CM | POA: Diagnosis not present

## 2022-07-25 DIAGNOSIS — Z87440 Personal history of urinary (tract) infections: Secondary | ICD-10-CM

## 2022-07-25 DIAGNOSIS — R8290 Unspecified abnormal findings in urine: Secondary | ICD-10-CM

## 2022-07-25 LAB — POCT URINALYSIS DIPSTICK
Bilirubin, UA: NEGATIVE
Blood, UA: NEGATIVE
Glucose, UA: NEGATIVE
Ketones, UA: NEGATIVE
Leukocytes, UA: NEGATIVE
Nitrite, UA: NEGATIVE
Protein, UA: NEGATIVE
Spec Grav, UA: <=1.005 — AB (ref 1.010–1.025)
Urobilinogen, UA: 0.2 E.U./dL
pH, UA: 5.5 (ref 5.0–8.0)

## 2022-07-25 LAB — URINALYSIS, MICROSCOPIC ONLY

## 2022-07-25 NOTE — Assessment & Plan Note (Signed)
Her surgery has been postponed by her pulmonologist due to persistent cough, and she is having recurrent symptoms of pressure and dysuria , only one of 3 cultures has been suggestive of UTI. Referral to [facility] urogynecologist has been made as she is not comfortable having the surgery done at [facility] bc it is a [facility]

## 2022-07-25 NOTE — Patient Instructions (Signed)
I have made the referral to the [facility] can use the azo products for symptom control  Add cranberry tablets or juice daily   Consider D Mannose as well  Start the cipro if symptoms worsen

## 2022-07-25 NOTE — Progress Notes (Signed)
Subjective:  Patient ID: Angela Mendoza, female    DOB: 02-17-1951  Age: 71 y.o. MRN: [id number]  CC: The primary encounter diagnosis was History of recurrent UTIs. Diagnoses of Dysuria, Abnormal urine, Cystocele without uterine prolapse, and Atrophic vaginitis were also pertinent to this visit.   HPI Angela Mendoza presents for  Chief Complaint  Patient presents with   Follow-up    Follow up on recurrent UTI   Treated for Klebsiella UTI cultured from urine on Sept 12  sensitive to augmentin.  Symptoms were urgency, frequency,  burning and pressure.. Presceribed augmenton on Sept 12 to sept 19. Symptoms improved with concurrent azo x 3 days,  on Day 4 of augmentin alone symptoms better.  On Day 6 developed more pressure and burning.    Repeat urine done on Sept 18  and empiric cipro was prescribed,  but she did not start the cipro.  She finished the augmentin.  URine culture was negative .  Yesterday she woke up with  'terrible" dysuria and frequency. Took azo yesterday but none this morning.   Has atrophic vaginitis,  uses estrogen cream 2/weekly.   Has cystocele,  surgery postponed due to her cough per Dr Halford Chessman  Outpatient Medications Prior to Visit  Medication Sig Dispense Refill   albuterol (PROVENTIL HFA) 108 (90 Base) MCG/ACT inhaler Inhale 2 puffs into the lungs every 6 (six) hours as needed. 18 g 2   ALPRAZolam (XANAX) 0.25 MG tablet TAKE 1 TABLET (0.25 MG TOTAL) BY MOUTH ONCE DAILY AS NEEDED FOR SLEEP. 30 tablet 5   atorvastatin (LIPITOR) 40 MG tablet Take 1 tablet (40 mg total) by mouth daily. 90 tablet 3   azelastine (ASTELIN) 0.1 % nasal spray Place 2 sprays into both nostrils 2 (two) times daily. Use in each nostril as directed 90 mL 3   benzonatate (TESSALON) 200 MG capsule Take 1 capsule (200 mg total) by mouth 3 (three) times daily as needed for cough. 30 capsule 1   Blood Glucose Calibration (ONETOUCH VERIO) High SOLN Used to check accuracy of blood glucose machine. 1  each 0   chlorpheniramine (CHLORPHEN) 4 MG tablet Take 1 tablet (4 mg total) by mouth at bedtime. 30 tablet 0   Cholecalciferol (VITAMIN D3) 50 MCG (2000 UT) TABS Take 1 tablet by mouth daily. 90 tablet 3   Coenzyme Q10 (COQ10 PO) Take by mouth daily.     conjugated estrogens (PREMARIN) vaginal cream Place vaginally 2 (two) times a week. INSERT 0.5 APPLICATORS FULL VAGINALLY TWICE PER WEEK. AT BEDTIME. 30 g 4   cyanocobalamin (VITAMIN B12) 1000 MCG/ML injection Inject 1 mL (1,000 mcg total) into the muscle every 30 (thirty) days. 3 mL 3   cycloSPORINE (RESTASIS) 0.05 % ophthalmic emulsion Place 1 drop into both eyes 2 (two) times daily. 3 each 3   Docusate Calcium (STOOL SOFTENER PO) Take 1 tablet by mouth as needed.     EPINEPHrine 0.3 mg/0.3 mL IJ SOAJ injection 0.3 mg by injection route. 1 each 1   estradiol (VIVELLE-DOT) 0.1 MG/24HR patch Place 1 patch (0.1 mg total) onto the skin 2 (two) times a week. 24 patch 3   famotidine (PEPCID) 20 MG tablet Take 1 tablet (20 mg total) by mouth daily as needed for heartburn or indigestion. 90 tablet 3   fexofenadine (ALLEGRA ALLERGY) 180 MG tablet Take 1 tablet (180 mg total) by mouth daily. 90 tablet 3   fluticasone (FLONASE) 50 MCG/ACT nasal spray Place 1 spray into  both nostrils daily. 48 g 3   glucose blood (ONETOUCH VERIO) test strip Used to check blood sugars 3 times daily. 300 each 3   ipratropium (ATROVENT) 0.03 % nasal spray Place 2 sprays into both nostrils 3 (three) times daily as needed for rhinitis. 30 mL 12   levothyroxine (SYNTHROID) 50 MCG tablet Take 1 tablet (50 mcg total) by mouth daily before breakfast. 90 tablet 3   losartan (COZAAR) 50 MG tablet Take 1 tablet (50 mg total) by mouth at bedtime. 90 tablet 3   magic mouthwash w/lidocaine SOLN Take 5 mLs by mouth 3 (three) times daily as needed for mouth pain. 150 mL 1   MAGNESIUM PO Take 250 mg by mouth daily.     meclizine (ANTIVERT) 25 MG tablet Take 1 tablet (25 mg total) by mouth 3  (three) times daily as needed (vertigo). 84 tablet 3   metoprolol succinate (TOPROL-XL) 50 MG 24 hr tablet Take 1 tablet (50 mg total) by mouth daily. 90 tablet 3   montelukast (SINGULAIR) 10 MG tablet Take 1 tablet (10 mg total) by mouth at bedtime. 90 tablet 3   Multiple Vitamin (MULTIVITAMIN) tablet Take 1 tablet by mouth daily.     mupirocin ointment (BACTROBAN) 2 % Apply topically.     ondansetron (ZOFRAN ODT) 4 MG disintegrating tablet Take 1 tablet (4 mg total) by mouth every 8 (eight) hours as needed for nausea or vomiting. 30 tablet 2   OneTouch Delica Lancets 62G MISC Used to check blood sugars twice daly. 100 each 5   Propylene Glycol 0.6 % SOLN Apply 1 drop to eye as needed.     psyllium (METAMUCIL) 58.6 % powder Take 1 packet by mouth as needed.     Syringe/Needle, Disp, (SYRINGE 3CC/20GX1") 20G X 1" 3 ML MISC 1 Syringe by Does not apply route every 30 (thirty) days. For use with b-12 injection monthly 50 each 0   Tiotropium Bromide Monohydrate (SPIRIVA RESPIMAT) 1.25 MCG/ACT AERS Inhale 2 puffs into the lungs daily. 12 g 3   tretinoin (RETIN-A) 0.05 % cream Apply topically at bedtime. 45 g 1   urea (CARMOL) 10 % cream Apply topically as needed. 71 g 3   No facility-administered medications prior to visit.    Review of Systems;  Patient denies headache, fevers, malaise, unintentional weight loss, skin rash, eye pain, sinus congestion and sinus pain, sore throat, dysphagia,  hemoptysis , cough, dyspnea, wheezing, chest pain, palpitations, orthopnea, edema, abdominal pain, nausea, melena, diarrhea, constipation, flank pain, dysuria, hematuria, urinary  Frequency, nocturia, numbness, tingling, seizures,  Focal weakness, Loss of consciousness,  Tremor, insomnia, depression, anxiety, and suicidal ideation.      Objective:  BP (!) 140/84 (BP Location: Left Arm, Patient Position: Sitting, Cuff Size: Normal)   Pulse 81   Temp 98.2 F (36.8 C) (Oral)   Ht '5\' 5"'$  (1.651 m)   Wt 184 lb  3.2 oz (83.6 kg)   SpO2 98%   BMI 30.65 kg/m   BP Readings from Last 3 Encounters:  07/25/22 (!) 140/84  07/23/22 (!) 120/90  07/18/22 138/70    Wt Readings from Last 3 Encounters:  07/25/22 184 lb 3.2 oz (83.6 kg)  07/23/22 184 lb 6.4 oz (83.6 kg)  07/18/22 185 lb 9.6 oz (84.2 kg)    General appearance: alert, cooperative and appears stated age Ears: normal TM's and external ear canals both ears Throat: lips, mucosa, and tongue normal; teeth and gums normal Neck: no adenopathy, no carotid  bruit, supple, symmetrical, trachea midline and thyroid not enlarged, symmetric, no tenderness/mass/nodules Back: symmetric, no curvature. ROM normal. No CVA tenderness. Lungs: clear to auscultation bilaterally Heart: regular rate and rhythm, S1, S2 normal, no murmur, click, rub or gallop Abdomen: soft, non-tender; bowel sounds normal; no masses,  no organomegaly Pulses: 2+ and symmetric Skin: Skin color, texture, turgor normal. No rashes or lesions Lymph nodes: Cervical, supraclavicular, and axillary nodes normal. Neuro:  awake and interactive with normal mood and affect. Higher cortical functions are normal. Speech is clear without word-finding difficulty or dysarthria. Extraocular movements are intact. Visual fields of both eyes are grossly intact. Sensation to light touch is grossly intact bilaterally of upper and lower extremities. Motor examination shows 4+/5 symmetric hand grip and upper extremity and 5/5 lower extremity strength. There is no pronation or drift. Gait is non-ataxic   Lab Results  Component Value Date   HGBA1C 5.9 (H) 05/30/2022   HGBA1C 6.5 05/28/2022   HGBA1C 5.9 10/12/2015    Lab Results  Component Value Date   CREATININE 0.80 07/18/2022   CREATININE 0.97 05/30/2022   CREATININE 0.97 05/28/2022    Lab Results  Component Value Date   WBC 8.2 06/10/2022   HGB 13.1 06/10/2022   HCT 39.7 06/10/2022   PLT 319.0 06/10/2022   GLUCOSE 90 05/30/2022   CHOL 142  05/30/2022   TRIG 100 05/30/2022   HDL 57 05/30/2022   LDLDIRECT 83.0 05/28/2020   LDLCALC 66 05/30/2022   ALT 25 05/30/2022   AST 26 05/30/2022   NA 136 05/30/2022   K 4.8 05/30/2022   CL 100 05/30/2022   CREATININE 0.80 07/18/2022   BUN 9 05/30/2022   CO2 27 05/30/2022   TSH 2.44 05/28/2022   INR 0.9 02/27/2022   HGBA1C 5.9 (H) 05/30/2022   MICROALBUR <0.2 05/30/2022    CT Angio Chest W/Cm &/Or Wo Cm  Result Date: 07/18/2022 CLINICAL DATA:  Chest pain.  Shortness of breath. EXAM: CT ANGIOGRAPHY CHEST WITH CONTRAST TECHNIQUE: Multidetector CT imaging of the chest was performed using the standard protocol during bolus administration of intravenous contrast. Multiplanar CT image reconstructions and MIPs were obtained to evaluate the vascular anatomy. RADIATION DOSE REDUCTION: This exam was performed according to the departmental dose-optimization program which includes automated exposure control, adjustment of the mA and/or kV according to patient size and/or use of iterative reconstruction technique. CONTRAST:  2m OMNIPAQUE IOHEXOL 350 MG/ML SOLN COMPARISON:  Two-view chest x-ray 06/10/2022.  CT heart 04/04/2021 FINDINGS: Cardiovascular: Heart size is normal. Aorta and great vessel origins are within normal limits. The descending thoracic aorta is unremarkable. Pulmonary artery opacification is excellent. No focal filling defects are present to suggest pulmonary embolus. Pulmonary artery size is within normal limits. Mediastinum/Nodes: No enlarged mediastinal, hilar, or axillary lymph nodes. Thyroid gland, trachea, and esophagus demonstrate no significant findings. Lungs/Pleura: Mild dependent atelectasis is present. No nodule or mass lesion is present. No significant pleural disease is present. Upper Abdomen: Cholecystectomy noted. Limited imaging the upper abdomen is otherwise unremarkable. Musculoskeletal: No chest wall abnormality. No acute or significant osseous findings. Review of the  MIP images confirms the above findings. IMPRESSION: 1. No pulmonary embolus. 2. No acute or focal lesion to explain the patient's chest pain. 3. Cholecystectomy. Electronically Signed   By: CSan MorelleM.D.   On: 07/18/2022 14:45   [place] Img Lower Bilateral  Result Date: 07/18/2022 CLINICAL DATA:  71year old female with bilateral lower extremity swelling. EXAM: BILATERAL LOWER EXTREMITY VENOUS DOPPLER  ULTRASOUND TECHNIQUE: Gray-scale sonography with graded compression, as well as color Doppler and duplex ultrasound were performed to evaluate the lower extremity deep venous systems from the level of the common femoral vein and including the common femoral, femoral, profunda femoral, popliteal and calf veins including the posterior tibial, peroneal and gastrocnemius veins when visible. The superficial great saphenous vein was also interrogated. Spectral Doppler was utilized to evaluate flow at rest and with distal augmentation maneuvers in the common femoral, femoral and popliteal veins. COMPARISON:  05/14/2017 FINDINGS: RIGHT LOWER EXTREMITY Common Femoral Vein: No evidence of thrombus. Normal compressibility, respiratory phasicity and response to augmentation. Saphenofemoral Junction: No evidence of thrombus. Normal compressibility and flow on color Doppler imaging. Profunda Femoral Vein: No evidence of thrombus. Normal compressibility and flow on color Doppler imaging. Femoral Vein: No evidence of thrombus. Normal compressibility, respiratory phasicity and response to augmentation. Popliteal Vein: No evidence of thrombus. Normal compressibility, respiratory phasicity and response to augmentation. Calf Veins: No evidence of thrombus. Normal compressibility and flow on color Doppler imaging. Other Findings:  None. LEFT LOWER EXTREMITY Common Femoral Vein: No evidence of thrombus. Normal compressibility, respiratory phasicity and response to augmentation. Saphenofemoral Junction: No evidence of  thrombus. Normal compressibility and flow on color Doppler imaging. Profunda Femoral Vein: No evidence of thrombus. Normal compressibility and flow on color Doppler imaging. Femoral Vein: No evidence of thrombus. Normal compressibility, respiratory phasicity and response to augmentation. Popliteal Vein: No evidence of thrombus. Normal compressibility, respiratory phasicity and response to augmentation. Calf Veins: No evidence of thrombus. Normal compressibility and flow on color Doppler imaging. Other Findings:  None. IMPRESSION: No evidence of bilateral lower extremity deep venous thrombosis. Ruthann Cancer, MD [facility] Radiology Electronically Signed   By: Ruthann Cancer M.D.   On: 07/18/2022 14:04    Assessment & Plan:   Problem List Items Addressed This Visit     Atrophic vaginitis    She is using estrogen cream twice weekly      Cystocele without uterine prolapse    Her surgery has been postponed by her pulmonologist due to persistent cough, and she is having recurrent symptoms of pressure and dysuria , only one of 3 cultures has been suggestive of UTI. Referral to [facility] urogynecologist has been made as she is not comfortable having the surgery done at [facility] bc it is a [facility]       Relevant Orders   Ambulatory referral to Urogynecology   Dysuria   Relevant Orders   Urine Culture   Other Visit Diagnoses     History of recurrent UTIs    -  Primary   Relevant Orders   POCT Urinalysis Dipstick (Completed)   Urine Microscopic Only   Abnormal urine           Follow-up: Return in about 3 months (around 10/24/2022).   Crecencio Mc, MD

## 2022-07-25 NOTE — Assessment & Plan Note (Signed)
She is using estrogen cream twice weekly

## 2022-07-27 ENCOUNTER — Encounter: Payer: Self-pay | Admitting: Internal Medicine

## 2022-07-27 LAB — URINE CULTURE
MICRO NUMBER:: 13987293
SPECIMEN QUALITY:: ADEQUATE

## 2022-07-28 ENCOUNTER — Other Ambulatory Visit: Payer: Self-pay | Admitting: Internal Medicine

## 2022-07-28 MED ORDER — AMOXICILLIN-POT CLAVULANATE 875-125 MG PO TABS: 1.0000 | ORAL_TABLET | Freq: Two times a day (BID) | ORAL | 0 refills | Status: DC

## 2022-07-28 NOTE — Telephone Encounter (Signed)
Patient states she would like for [place] to please call her.

## 2022-07-28 NOTE — Telephone Encounter (Signed)
Patient calling she stated she has been waiting on a phone call back and she is still hurting.

## 2022-07-28 NOTE — Telephone Encounter (Signed)
Patient states her pain has gotten worse over the weekend and it seems to be located in the vulva and urethra area.  Patient states the AZO isn't doing a whole lot to help.  Patient states she hasn't slept the last two nights because it keeps her up.  Patient states she is still experiencing intense pain, burning, and increased frequency of urination.  Patient states is hurts when she is not urinating, but even more when she is urinating.  Patient states she would like to know if Dr. Deborra Medina could prescribe an antibiotic to help her ASAP.  *Patient states her preferred pharmacy is [facility].

## 2022-07-29 ENCOUNTER — Ambulatory Visit: Payer: Medicare Other | Admitting: Gastroenterology

## 2022-08-01 ENCOUNTER — Telehealth: Payer: Self-pay

## 2022-08-01 NOTE — Telephone Encounter (Signed)
Patient states she is following up on referral to Dr. Alta Corning - Urogynecology at [facility].  Patient states she called today to check and they have not received the referral.  Patient states she would appreciate anything Dr. Deborra Medina can do to get her in quickly.

## 2022-08-01 NOTE — Telephone Encounter (Signed)
YoungBlood, Rasheedah R  You; Walker Shadow P 14 minutes ago (3:49 PM)   RY Good afternoon!   Happy Friday!   I spoke with Taylor pt is scheduled on 10/16/22 re fax referral ofc notes to a different fax number. No sooner appt avail pt is on waiting list. Pt has been notified.   Thank you!

## 2022-08-04 DIAGNOSIS — Z85828 Personal history of other malignant neoplasm of skin: Secondary | ICD-10-CM | POA: Diagnosis not present

## 2022-08-04 DIAGNOSIS — D225 Melanocytic nevi of trunk: Secondary | ICD-10-CM | POA: Diagnosis not present

## 2022-08-04 DIAGNOSIS — Z1283 Encounter for screening for malignant neoplasm of skin: Secondary | ICD-10-CM | POA: Diagnosis not present

## 2022-08-04 DIAGNOSIS — D1801 Hemangioma of skin and subcutaneous tissue: Secondary | ICD-10-CM | POA: Diagnosis not present

## 2022-08-04 DIAGNOSIS — D2270 Melanocytic nevi of unspecified lower limb, including hip: Secondary | ICD-10-CM | POA: Diagnosis not present

## 2022-08-04 DIAGNOSIS — C44311 Basal cell carcinoma of skin of nose: Secondary | ICD-10-CM | POA: Diagnosis not present

## 2022-08-04 DIAGNOSIS — L918 Other hypertrophic disorders of the skin: Secondary | ICD-10-CM | POA: Diagnosis not present

## 2022-08-04 DIAGNOSIS — L814 Other melanin hyperpigmentation: Secondary | ICD-10-CM | POA: Diagnosis not present

## 2022-08-04 DIAGNOSIS — D2260 Melanocytic nevi of unspecified upper limb, including shoulder: Secondary | ICD-10-CM | POA: Diagnosis not present

## 2022-08-04 DIAGNOSIS — D3610 Benign neoplasm of peripheral nerves and autonomic nervous system, unspecified: Secondary | ICD-10-CM | POA: Diagnosis not present

## 2022-08-04 DIAGNOSIS — L84 Corns and callosities: Secondary | ICD-10-CM | POA: Diagnosis not present

## 2022-08-04 DIAGNOSIS — L219 Seborrheic dermatitis, unspecified: Secondary | ICD-10-CM | POA: Diagnosis not present

## 2022-08-04 DIAGNOSIS — L910 Hypertrophic scar: Secondary | ICD-10-CM | POA: Diagnosis not present

## 2022-08-04 DIAGNOSIS — L821 Other seborrheic keratosis: Secondary | ICD-10-CM | POA: Diagnosis not present

## 2022-08-04 DIAGNOSIS — L820 Inflamed seborrheic keratosis: Secondary | ICD-10-CM | POA: Diagnosis not present

## 2022-08-04 DIAGNOSIS — D485 Neoplasm of uncertain behavior of skin: Secondary | ICD-10-CM | POA: Diagnosis not present

## 2022-08-08 ENCOUNTER — Encounter: Payer: Self-pay | Admitting: Gastroenterology

## 2022-08-13 ENCOUNTER — Ambulatory Visit (INDEPENDENT_AMBULATORY_CARE_PROVIDER_SITE_OTHER): Payer: Medicare Other | Admitting: Primary Care

## 2022-08-13 ENCOUNTER — Encounter: Payer: Self-pay | Admitting: Primary Care

## 2022-08-13 ENCOUNTER — Ambulatory Visit: Payer: Medicare Other | Admitting: Gastroenterology

## 2022-08-13 VITALS — BP 132/74 | HR 86 | Temp 98.5°F | Ht 65.0 in | Wt 180.6 lb

## 2022-08-13 DIAGNOSIS — R058 Other specified cough: Secondary | ICD-10-CM | POA: Diagnosis not present

## 2022-08-13 DIAGNOSIS — G4733 Obstructive sleep apnea (adult) (pediatric): Secondary | ICD-10-CM | POA: Diagnosis not present

## 2022-08-13 DIAGNOSIS — K219 Gastro-esophageal reflux disease without esophagitis: Secondary | ICD-10-CM | POA: Diagnosis not present

## 2022-08-13 DIAGNOSIS — J45909 Unspecified asthma, uncomplicated: Secondary | ICD-10-CM | POA: Diagnosis not present

## 2022-08-13 MED ORDER — IPRATROPIUM BROMIDE 0.03 % NA SOLN: 2.0000 | Freq: Three times a day (TID) | NASAL | 3 refills | Status: DC | PRN

## 2022-08-13 MED ORDER — BENZONATATE 200 MG PO CAPS: 200.0000 mg | ORAL_CAPSULE | Freq: Three times a day (TID) | ORAL | 1 refills | Status: DC | PRN

## 2022-08-13 NOTE — Patient Instructions (Addendum)
Recommendations: Use Atrovent nasal spray three times a day  Orders: Change CPAP pressure 5-10  Mask fitting with [employer]   Rx: Atrovent nasal spray sent to mail pharmacy  Tesslon perles to mail pharmacy   Follow-up: 3 months with Dr. Halford Chessman or sooner if needed

## 2022-08-13 NOTE — Assessment & Plan Note (Signed)
-   Largely d/t allergic rhinitis. She saw significant improvement with addition of Atrovent nasal spray and Chlorpheniramine at night. Cough is still present and she is worried about having upcoming urologic procedure in November.Advised she try using nasal spray three times a day. We may consider adding either gabapentin or tramadol to help with cough suppression   - continue allegra during the day - continue flonase daily, azelastine bid, singulair qhs - continue chlorpheniramine 4 mg nightly - continue tessalon 200 mg tid prn - continue atrovent nasal spray tid prn

## 2022-08-13 NOTE — Progress Notes (Signed)
$'@Patient'W$  ID: Angela Mendoza, female    DOB: [birth year], 71 y.o.   MRN: [id number]  Chief Complaint  Patient presents with   Follow-up    Referring provider: Crecencio Mc, MD  HPI:   Previous [facility] pulmonary encounter: 07/23/22- Dr. Halford Chessman  She is here with her husband.  She had CT chest with PCP.  Showed mild dependent atelectasis.  She has sinus and ear pressure.  Gets drainage especially when she lays flat.  This triggers a cough.  She has more cough at home and is worried if she had cat dander and dust still in her home carpeting.    She hasn't noticed her breathing getting worse since she stopped symbicort.    She uses CPAP nightly.   08/13/2022 - Interim hx  Patient presents today for 2-3 week follow-up. She is doing better, cough has improved since last visit. She feels Atrovent nasal spray has been helping, she is using this 2-3 times a day. She would like to continue. She still has cough and is concerned about upcoming urologic procedure in November and is unsure if she should reschedule. She is using Spiriva respimat daily. No thrush symptoms. Reports increased fatigue and racing heart rate when she wakes up in the morning since CPAP pressure was lowered.   Airview download 07/13/22-08/11/22 30/30 days used; 100% greater than 4 hours Average usage 7 hours 34 minutes Pressure 4 to 8 cm H2O (8.6 cm H2O-95%) AHI 0.6  Allergies  Allergen Reactions   Codeine Other (See Comments)    Low blood pressure/nausea   Qvar [Beclomethasone] Other (See Comments)    Thrush per patient even with rinsing.    Sulfonamide Derivatives Rash    rash    Immunization History  Administered Date(s) Administered   Fluad Quad(high Dose 65+) 07/16/2019, 07/20/2020, 08/05/2021   Influenza Split 07/15/2013, 07/12/2015, 07/14/2016, 08/04/2017   Influenza Whole 07/27/2009, 07/28/2011, 07/21/2012   Influenza, High Dose Seasonal PF 07/22/2017, 07/01/2018   Influenza,inj,Quad PF,6+ Mos  08/03/2014, 07/12/2015, 07/14/2016   Influenza-Unspecified 07/15/2013, 08/03/2014, 07/12/2015, 07/14/2016, 08/04/2017   Moderna SARS-COV2 Booster Vaccination 08/17/2020, 02/01/2021   Moderna Sars-Covid-2 Vaccination 12/11/2019, 01/08/2020, 07/25/2021   Pneumococcal Conjugate-13 11/08/2014   Pneumococcal Polysaccharide-23 07/27/2009, 12/27/2015   Td 10/27/2008   Tdap 12/19/2013, 02/25/2017   Tetanus 10/27/2008   Zoster Recombinat (Shingrix) 04/27/2020, 11/26/2020   Zoster, Live 10/27/2009    Past Medical History:  Diagnosis Date   Agatston coronary artery calcium score less than 100    a. 06/2018 Cardiac CT: Ca2+ = 3 (49th %'ile).   Allergic rhinitis 06/09/2008   chronic   Aortic atherosclerosis ([place])    a. 06/2018 noted on chest CT.   Asthma, intrinsic 10/06/2011   controlled on qvar. [name] 09/2011:  Very mild airflow obstruction (FEV1% 68, FVL c/w obstruction)    Basal cell carcinoma 06/10/2021   right prox nasal ala rim - MOHs 07/12/21 Dr. Lacinda Axon   Chronic headache 06/09/2008   COPD (chronic obstructive pulmonary disease) ([place])    Depression    Diverticulosis 2013   h/o diverticulitis   Dry eyes    GERD (gastroesophageal reflux disease)    History of breast implant removal    silicone mastitis - R axilla silicone LN, free silicone L breast upper outer quadrant   Hot flashes    HYPERLIPIDEMIA 06/09/2008   HYPERTENSION 06/09/2008   Hypothyroidism    MVP (mitral valve prolapse)    OSA on CPAP    [name]   PAF (paroxysmal  atrial fibrillation) ([place]) 06/09/2008   a. CHA2DS2VASc = 3-->prefers ASA.   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 Hx of children who are grown   Outpatient Medications Prior to Visit  Medication Sig Dispense Refill   albuterol (PROVENTIL HFA) 108 (90 Base) MCG/ACT inhaler Inhale 2 puffs into the lungs every 6 (six) hours as needed. 18 g 2   ALPRAZolam (XANAX) 0.25 MG tablet TAKE 1 TABLET (0.25 MG TOTAL) BY MOUTH ONCE DAILY AS NEEDED FOR SLEEP. 30 tablet 5   atorvastatin (LIPITOR) 40 MG tablet Take 1 tablet (40 mg total) by mouth daily. 90 tablet 3   azelastine (ASTELIN) 0.1 % nasal spray Place 2 sprays into both nostrils 2 (two) times daily. Use in each nostril as directed 90 mL 3   Blood Glucose Calibration (ONETOUCH VERIO) High SOLN Used to check accuracy of blood glucose machine. 1 each 0   chlorpheniramine (CHLORPHEN) 4 MG tablet Take 1 tablet (4 mg total) by mouth at bedtime. 30 tablet 0   Cholecalciferol (VITAMIN D3) 50 MCG (2000 UT) TABS Take 1 tablet by mouth daily. 90 tablet 3   Coenzyme Q10 (COQ10 PO) Take by mouth daily.     conjugated estrogens (PREMARIN) vaginal cream Place vaginally 2 (two) times a week. INSERT 0.5 APPLICATORS FULL VAGINALLY TWICE PER WEEK. AT BEDTIME. 30 g 4   cyanocobalamin (VITAMIN B12) 1000 MCG/ML injection Inject 1 mL (1,000 mcg total) into the muscle every 30 (thirty) days. 3 mL 3   cycloSPORINE (RESTASIS) 0.05 % ophthalmic emulsion Place 1 drop into both eyes 2 (two) times daily. 3 each 3   desonide (DESOWEN) 0.05 % cream Apply topically.     Docusate Calcium (STOOL SOFTENER PO) Take 1 tablet by mouth as needed.     EPINEPHrine 0.3 mg/0.3 mL IJ SOAJ injection 0.3 mg by injection route. 1 each 1   estradiol (VIVELLE-DOT) 0.1 MG/24HR patch Place 1 patch (0.1 mg total) onto the skin 2 (two) times a week. 24 patch 3   famotidine (PEPCID) 20 MG tablet Take 1 tablet (20 mg  total) by mouth daily as needed for heartburn or indigestion. 90 tablet 3   fexofenadine (ALLEGRA ALLERGY) 180 MG tablet Take 1 tablet (180 mg total) by mouth daily. 90 tablet 3   fluticasone (FLONASE) 50 MCG/ACT nasal spray Place 1 spray into both nostrils daily. 48 g 3   glucose blood (ONETOUCH VERIO) test strip Used to check blood sugars 3 times daily. 300 each 3   levothyroxine (SYNTHROID) 50 MCG tablet Take 1 tablet (50 mcg total) by mouth daily before breakfast. 90 tablet 3   losartan (COZAAR) 50 MG tablet Take 1 tablet (50 mg total) by mouth at bedtime. 90 tablet 3   magic mouthwash w/lidocaine SOLN Take 5 mLs by mouth 3 (three) times daily as needed for mouth pain. 150 mL 1  MAGNESIUM PO Take 250 mg by mouth daily.     meclizine (ANTIVERT) 25 MG tablet Take 1 tablet (25 mg total) by mouth 3 (three) times daily as needed (vertigo). 84 tablet 3   metoprolol succinate (TOPROL-XL) 50 MG 24 hr tablet Take 1 tablet (50 mg total) by mouth daily. 90 tablet 3   montelukast (SINGULAIR) 10 MG tablet Take 1 tablet (10 mg total) by mouth at bedtime. 90 tablet 3   Multiple Vitamin (MULTIVITAMIN) tablet Take 1 tablet by mouth daily.     OneTouch Delica Lancets 36U MISC Used to check blood sugars twice daly. 100 each 5   Propylene Glycol 0.6 % SOLN Apply 1 drop to eye as needed.     psyllium (METAMUCIL) 58.6 % powder Take 1 packet by mouth as needed.     Syringe/Needle, Disp, (SYRINGE 3CC/20GX1") 20G X 1" 3 ML MISC 1 Syringe by Does not apply route every 30 (thirty) days. For use with b-12 injection monthly 50 each 0   Tiotropium Bromide Monohydrate (SPIRIVA RESPIMAT) 1.25 MCG/ACT AERS Inhale 2 puffs into the lungs daily. 12 g 3   tretinoin (RETIN-A) 0.05 % cream Apply topically at bedtime. 45 g 1   urea (CARMOL) 40 % CREA Apply topically.     benzonatate (TESSALON) 200 MG capsule Take 1 capsule (200 mg total) by mouth 3 (three) times daily as needed for cough. 30 capsule 1   ipratropium (ATROVENT) 0.03  % nasal spray Place 2 sprays into both nostrils 3 (three) times daily as needed for rhinitis. 30 mL 12   mupirocin ointment (BACTROBAN) 2 % Apply topically. (Patient not taking: Reported on 08/13/2022)     ondansetron (ZOFRAN ODT) 4 MG disintegrating tablet Take 1 tablet (4 mg total) by mouth every 8 (eight) hours as needed for nausea or vomiting. (Patient not taking: Reported on 08/13/2022) 30 tablet 2   amoxicillin-clavulanate (AUGMENTIN) 875-125 MG tablet Take 1 tablet by mouth 2 (two) times daily. (Patient not taking: Reported on 08/13/2022) 14 tablet 0   urea (CARMOL) 10 % cream Apply topically as needed. 71 g 3   No facility-administered medications prior to visit.    Review of Systems  Review of Systems  Constitutional:  Positive for fatigue.  HENT:  Positive for postnasal drip.   Respiratory:  Positive for cough. Negative for shortness of breath and wheezing.   Cardiovascular: Negative.    Physical Exam  BP 132/74 (BP Location: Left Arm, Patient Position: Sitting, Cuff Size: Normal)   Pulse 86   Temp 98.5 F (36.9 C) (Oral)   Ht '5\' 5"'$  (1.651 m)   Wt 180 lb 9.6 oz (81.9 kg)   SpO2 98%   BMI 30.05 kg/m  Physical Exam Constitutional:      Appearance: Normal appearance.  HENT:     Head: Normocephalic and atraumatic.     Mouth/Throat:     Mouth: Mucous membranes are moist.     Pharynx: Oropharynx is clear.  Cardiovascular:     Rate and Rhythm: Normal rate and regular rhythm.  Pulmonary:     Effort: Pulmonary effort is normal.     Breath sounds: Normal breath sounds.  Skin:    General: Skin is warm and dry.  Neurological:     General: No focal deficit present.     Mental Status: She is alert and oriented to person, place, and time. Mental status is at baseline.  Psychiatric:        Mood and Affect: Mood normal.  Behavior: Behavior normal.        Thought Content: Thought content normal.        Judgment: Judgment normal.      Lab Results:  CBC     Component Value Date/Time   WBC 8.2 06/10/2022 0941   RBC 4.18 06/10/2022 0941   HGB 13.1 06/10/2022 0941   HGB 13.7 12/07/2012 0000   HCT 39.7 06/10/2022 0941   PLT 319.0 06/10/2022 0941   MCV 94.9 06/10/2022 0941   MCH 32.1 05/30/2022 1432   MCHC 33.0 06/10/2022 0941   RDW 13.4 06/10/2022 0941   LYMPHSABS 3.6 06/10/2022 0941   MONOABS 1.0 06/10/2022 0941   EOSABS 0.2 06/10/2022 0941   BASOSABS 0.0 06/10/2022 0941    BMET    Component Value Date/Time   NA 136 05/30/2022 1428   NA 142 09/26/2016 0000   K 4.8 05/30/2022 1428   CL 100 05/30/2022 1428   CO2 27 05/30/2022 1428   GLUCOSE 90 05/30/2022 1428   BUN 9 05/30/2022 1428   BUN 12 09/26/2016 0000   CREATININE 0.80 07/18/2022 1414   CREATININE 0.97 05/30/2022 1428   CALCIUM 9.3 05/30/2022 1428   GFRNONAA >60 12/29/2020 1419    BNP No results found for: "BNP"  ProBNP    Component Value Date/Time   PROBNP 37.0 06/10/2022 0941    Imaging: CT Angio Chest W/Cm &/Or Wo Cm  Result Date: 07/18/2022 CLINICAL DATA:  Chest pain.  Shortness of breath. EXAM: CT ANGIOGRAPHY CHEST WITH CONTRAST TECHNIQUE: Multidetector CT imaging of the chest was performed using the standard protocol during bolus administration of intravenous contrast. Multiplanar CT image reconstructions and MIPs were obtained to evaluate the vascular anatomy. RADIATION DOSE REDUCTION: This exam was performed according to the departmental dose-optimization program which includes automated exposure control, adjustment of the mA and/or kV according to patient size and/or use of iterative reconstruction technique. CONTRAST:  55m OMNIPAQUE IOHEXOL 350 MG/ML SOLN COMPARISON:  Two-view chest x-ray 06/10/2022.  CT heart 04/04/2021 FINDINGS: Cardiovascular: Heart size is normal. Aorta and great vessel origins are within normal limits. The descending thoracic aorta is unremarkable. Pulmonary artery opacification is excellent. No focal filling defects are present to  suggest pulmonary embolus. Pulmonary artery size is within normal limits. Mediastinum/Nodes: No enlarged mediastinal, hilar, or axillary lymph nodes. Thyroid gland, trachea, and esophagus demonstrate no significant findings. Lungs/Pleura: Mild dependent atelectasis is present. No nodule or mass lesion is present. No significant pleural disease is present. Upper Abdomen: Cholecystectomy noted. Limited imaging the upper abdomen is otherwise unremarkable. Musculoskeletal: No chest wall abnormality. No acute or significant osseous findings. Review of the MIP images confirms the above findings. IMPRESSION: 1. No pulmonary embolus. 2. No acute or focal lesion to explain the patient's chest pain. 3. Cholecystectomy. Electronically Signed   By: CSan MorelleM.D.   On: 07/18/2022 14:45   UKoreaVenous Img Lower Bilateral  Result Date: 07/18/2022 CLINICAL DATA:  71year old female with bilateral lower extremity swelling. EXAM: BILATERAL LOWER EXTREMITY VENOUS DOPPLER ULTRASOUND TECHNIQUE: Gray-scale sonography with graded compression, as well as color Doppler and duplex ultrasound were performed to evaluate the lower extremity deep venous systems from the level of the common femoral vein and including the common femoral, femoral, profunda femoral, popliteal and calf veins including the posterior tibial, peroneal and gastrocnemius veins when visible. The superficial great saphenous vein was also interrogated. Spectral Doppler was utilized to evaluate flow at rest and with distal augmentation maneuvers in the common femoral, femoral and  popliteal veins. COMPARISON:  05/14/2017 FINDINGS: RIGHT LOWER EXTREMITY Common Femoral Vein: No evidence of thrombus. Normal compressibility, respiratory phasicity and response to augmentation. Saphenofemoral Junction: No evidence of thrombus. Normal compressibility and flow on color Doppler imaging. Profunda Femoral Vein: No evidence of thrombus. Normal compressibility and flow on  color Doppler imaging. Femoral Vein: No evidence of thrombus. Normal compressibility, respiratory phasicity and response to augmentation. Popliteal Vein: No evidence of thrombus. Normal compressibility, respiratory phasicity and response to augmentation. Calf Veins: No evidence of thrombus. Normal compressibility and flow on color Doppler imaging. Other Findings:  None. LEFT LOWER EXTREMITY Common Femoral Vein: No evidence of thrombus. Normal compressibility, respiratory phasicity and response to augmentation. Saphenofemoral Junction: No evidence of thrombus. Normal compressibility and flow on color Doppler imaging. Profunda Femoral Vein: No evidence of thrombus. Normal compressibility and flow on color Doppler imaging. Femoral Vein: No evidence of thrombus. Normal compressibility, respiratory phasicity and response to augmentation. Popliteal Vein: No evidence of thrombus. Normal compressibility, respiratory phasicity and response to augmentation. Calf Veins: No evidence of thrombus. Normal compressibility and flow on color Doppler imaging. Other Findings:  None. IMPRESSION: No evidence of bilateral lower extremity deep venous thrombosis. Ruthann Cancer, MD [facility] Electronically Signed   By: Ruthann Cancer M.D.   On: 07/18/2022 14:04     Assessment & Plan:   OSA on CPAP - Pressure setting lowered during last visit d/t cough but patient reports increased fatigue symptoms, headache and racing heart rate in the morning. Re-adjusting CPAP pressure from 4-8cm h20 to 5-10cm h20. She also needs mask fittig with DME company.   Upper airway cough syndrome - Largely d/t allergic rhinitis. She saw significant improvement with addition of Atrovent nasal spray and Chlorpheniramine at night. Cough is still present and she is worried about having upcoming urologic procedure in November.Advised she try using nasal spray three times a day. We may consider adding  either gabapentin or tramadol to help with cough suppression   - continue allegra during the day - continue flonase daily, azelastine bid, singulair qhs - continue chlorpheniramine 4 mg nightly - continue tessalon 200 mg tid prn - continue atrovent nasal spray tid prn  Asthma, intrinsic - Intolerant to ICS d.t thrush, continue Spiriva respimat 1.43mg two puffs once daily and prn albuterol - She has received RSV vaccine and will be getting influenza vaccine in the next week   GERD (gastroesophageal reflux disease) - She is scheduled to see GI to discuss colonscopy, she may benefit from EGD at the same time d.t her history of reflux       EMartyn Ehrich NP 08/13/2022

## 2022-08-13 NOTE — Assessment & Plan Note (Addendum)
-   Intolerant to ICS d.t thrush, continue Spiriva respimat 1.83mg two puffs once daily and prn albuterol - She has received RSV vaccine and will be getting influenza vaccine in the next week

## 2022-08-13 NOTE — Assessment & Plan Note (Signed)
-   Pressure setting lowered during last visit d/t cough but patient reports increased fatigue symptoms, headache and racing heart rate in the morning. Re-adjusting CPAP pressure from 4-8cm h20 to 5-10cm h20. She also needs mask fittig with DME company.

## 2022-08-13 NOTE — Assessment & Plan Note (Signed)
-   She is scheduled to see GI to discuss colonscopy, she may benefit from EGD at the same time d.t her history of reflux

## 2022-08-14 NOTE — Progress Notes (Signed)
Reviewed and agree with assessment/plan.   Chesley Mires, MD [facility] Pulmonary/Critical Care 08/14/2022, 11:25 AM Pager:  [phone]

## 2022-08-15 ENCOUNTER — Ambulatory Visit (INDEPENDENT_AMBULATORY_CARE_PROVIDER_SITE_OTHER): Payer: Medicare Other | Admitting: Gastroenterology

## 2022-08-15 ENCOUNTER — Telehealth: Payer: Self-pay | Admitting: Primary Care

## 2022-08-15 ENCOUNTER — Encounter: Payer: Self-pay | Admitting: Gastroenterology

## 2022-08-15 VITALS — BP 126/70 | HR 75 | Ht 65.0 in | Wt 181.0 lb

## 2022-08-15 DIAGNOSIS — Z1211 Encounter for screening for malignant neoplasm of colon: Secondary | ICD-10-CM | POA: Diagnosis not present

## 2022-08-15 DIAGNOSIS — R053 Chronic cough: Secondary | ICD-10-CM | POA: Diagnosis not present

## 2022-08-15 DIAGNOSIS — K219 Gastro-esophageal reflux disease without esophagitis: Secondary | ICD-10-CM

## 2022-08-15 DIAGNOSIS — Z23 Encounter for immunization: Secondary | ICD-10-CM | POA: Diagnosis not present

## 2022-08-15 MED ORDER — NA SULFATE-K SULFATE-MG SULF 17.5-3.13-1.6 GM/177ML PO SOLN: 1.0000 | Freq: Once | ORAL | 0 refills | Status: AC

## 2022-08-15 NOTE — Progress Notes (Signed)
Referring Provider: Crecencio Mc, MD Primary Care Physician:  Crecencio Mc, MD  Reason for Consultation: Colon cancer screening   IMPRESSION:  Longstanding reflux with chronic cough. Continue H2B. EGD recommended to evaluate for reflux related complications such as esophagitis, stricture, or Barrett's Esophagus and to evaluate for anatomic considerations such as gastric outlet obstruction or hiatal hernia that may contribute to reflux.    Diverticulosis with prior history of diverticulitis. No ongoing symptoms related to diverticulitis. Reviewed recommendations to follow a high fiber diet or to use fiber supplements on a regular basis.  However, there is no need to avoid seeds, corn, berries, and nuts. Minimizing red meat in the diet may protect against future episodes of diverticulitis. Using NSAIDs may be associated with a moderately increased risk of occurrence of any episode of diverticulitis and complicated diverticulitis. Could consider surgical consultation for possible resection with recurrent symptoms.    Suspected anal skin tags. Will assess at time of colonoscopy.   Need for colon cancer screening. Last colonoscopy 2013. Colonoscopy recommended.   Family history of colon polyps (sister)  PLAN: - EGD - Colonoscopy for colon cancer surveillance - May want to consider surgical consultation re: skin tags - See patient instructions for additional recommendations  HPI: Angela Mendoza is a 71 y.o. female referred by Dr. Derrel Nip for colon cancer screening.  However, this office visit was scheduled prior to the colonoscopy the patient was reporting problems with hemorrhoids, reflux, and a chronic cough.  She has a history of dysuria, cystocele with uterine prolapse, obstructive sleep apnea on CPAP, intrinsic asthma, fibrocystic breast disease, atrophic vaginitis, and a remote history of atrial fibrillation. She has a history of a suspension surgery in the 1980s after delivery of her  twins and her c-section. She was told she had trouble voiding and needed her vagina "widened" and then her vagina or bladder was "tacked up". Second surgery was for repair of rectocele in 2011. She has upcoming plans for surgery for pelvic organ prolapse [facility] next month.   She has also recently been under evaluation by pulmonary for an upper airway cough syndrome largely due to allergic rhinitis.   She has a longstanding history of reflux manifest as heartburn. Occasionally finds that she needs to repeatedly swallow to pass the bolus. She follows a careful anti-reflux diet. Symptoms have been controlled on famotidine. The pulmonology team has suggested that she have an upper endoscopy at the time of her colonoscopy to further evaluate her chronic cough. The patient denies alarm symptoms including anemia, dysphagia, early satiety, hematemesis, hematochezia, melena, odynophagia, and weight loss.    She has a distant history of diverticulitis approximately 10 years ago. She has a skin tag that causes difficulty with cleaning after defecation.   Distant EGD and colonoscopy with Dr. Lyndel Safe. Colonoscopy with Dr. Leonidas Romberg 09/02/2012: Normal except for sigmoid diverticulosis EGD with Dr. Leonidas Romberg 09/02/2012: Normal  Prior endoscopy performed without difficulty.   Sister has had multiple polyps. There is no other known family history of colon cancer or polyps. No family history of stomach cancer or other GI malignancy. No family history of inflammatory bowel disease or celiac.    Past Medical History:  Diagnosis Date   Agatston coronary artery calcium score less than 100    a. 06/2018 Cardiac CT: Ca2+ = 3 (49th %'ile).   Allergic rhinitis 06/09/2008   chronic   Aortic atherosclerosis ([place])    a. 06/2018 noted on chest CT.   Asthma, intrinsic  10/06/2011   controlled on qvar. [name] 09/2011:  Very mild airflow obstruction (FEV1% 68, FVL c/w obstruction)    Basal cell carcinoma 06/10/2021   right prox  nasal ala rim - MOHs 07/12/21 Dr. Lacinda Axon   Chronic headache 06/09/2008   COPD (chronic obstructive pulmonary disease) ([place])    Depression    Diverticulosis 2013   h/o diverticulitis   Dry eyes    GERD (gastroesophageal reflux disease)    History of breast implant removal    silicone mastitis - R axilla silicone LN, free silicone L breast upper outer quadrant   Hot flashes    HYPERLIPIDEMIA 06/09/2008   HYPERTENSION 06/09/2008   Hypothyroidism    MVP (mitral valve prolapse)    OSA on CPAP    [name]   PAF (paroxysmal atrial fibrillation) ([place]) 06/09/2008   a. CHA2DS2VASc = 3-->prefers ASA.   Pernicious anemia    per prior pcp   Rosacea    Surgical menopause 1991   Vertigo    Vitamin D deficiency     Past Surgical History:  Procedure Laterality Date   APPENDECTOMY  2007   BREAST BIOPSY Left 09/27/2009   BREAST ENHANCEMENT SURGERY  2006   CARDIOVASCULAR STRESS TEST  2013   [place] [name] - WNL, EF 55-60%, with 0 calcium score   CHOLECYSTECTOMY  2007   biliary dyskinesia   COLONOSCOPY  08/2012   diverticulosis   COLONOSCOPY  2006   inflamed hyperplastic polyp   ESOPHAGOGASTRODUODENOSCOPY  08/2012   esophageal reflux   HERNIA REPAIR     NASAL SINUS SURGERY     RECTOCELE REPAIR  12/2008   [name]   sleep study  08/2007   OSA, 12 cm H2O,   TOTAL ABDOMINAL HYSTERECTOMY  1991   heavy bleeding, ovaries removed   TUBAL LIGATION     US ECHOCARDIOGRAPHY  09/2009   impaired relaxation, mild tric regurg, normal MV, EF 60%   US ECHOCARDIOGRAPHY  11/2007   mild-mod MR     Current Outpatient Medications  Medication Sig Dispense Refill   albuterol (PROVENTIL HFA) 108 (90 Base) MCG/ACT inhaler Inhale 2 puffs into the lungs every 6 (six) hours as needed. 18 g 2   ALPRAZolam (XANAX) 0.25 MG tablet TAKE 1 TABLET (0.25 MG TOTAL) BY MOUTH ONCE DAILY AS NEEDED FOR SLEEP. 30 tablet 5   atorvastatin (LIPITOR) 40 MG tablet Take 1 tablet (40 mg total) by mouth daily. 90 tablet 3    azelastine (ASTELIN) 0.1 % nasal spray Place 2 sprays into both nostrils 2 (two) times daily. Use in each nostril as directed 90 mL 3   benzonatate (TESSALON) 200 MG capsule Take 1 capsule (200 mg total) by mouth 3 (three) times daily as needed for cough. 30 capsule 1   Blood Glucose Calibration (ONETOUCH VERIO) High SOLN Used to check accuracy of blood glucose machine. 1 each 0   chlorpheniramine (CHLORPHEN) 4 MG tablet Take 1 tablet (4 mg total) by mouth at bedtime. 30 tablet 0   Cholecalciferol (VITAMIN D3) 50 MCG (2000 UT) TABS Take 1 tablet by mouth daily. 90 tablet 3   Coenzyme Q10 (COQ10 PO) Take by mouth daily.     conjugated estrogens (PREMARIN) vaginal cream Place vaginally 2 (two) times a week. INSERT 0.5 APPLICATORS FULL VAGINALLY TWICE PER WEEK. AT BEDTIME. 30 g 4   cyanocobalamin (VITAMIN B12) 1000 MCG/ML injection Inject 1 mL (1,000 mcg total) into the muscle every 30 (thirty) days. 3 mL 3  cycloSPORINE (RESTASIS) 0.05 % ophthalmic emulsion Place 1 drop into both eyes 2 (two) times daily. 3 each 3   desonide (DESOWEN) 0.05 % cream Apply topically.     Docusate Calcium (STOOL SOFTENER PO) Take 1 tablet by mouth as needed.     EPINEPHrine 0.3 mg/0.3 mL IJ SOAJ injection 0.3 mg by injection route. 1 each 1   estradiol (VIVELLE-DOT) 0.1 MG/24HR patch Place 1 patch (0.1 mg total) onto the skin 2 (two) times a week. 24 patch 3   famotidine (PEPCID) 20 MG tablet Take 1 tablet (20 mg total) by mouth daily as needed for heartburn or indigestion. 90 tablet 3   fexofenadine (ALLEGRA ALLERGY) 180 MG tablet Take 1 tablet (180 mg total) by mouth daily. 90 tablet 3   fluticasone (FLONASE) 50 MCG/ACT nasal spray Place 1 spray into both nostrils daily. 48 g 3   glucose blood (ONETOUCH VERIO) test strip Used to check blood sugars 3 times daily. 300 each 3   ipratropium (ATROVENT) 0.03 % nasal spray Place 2 sprays into both nostrils 3 (three) times daily as needed for rhinitis. 90 mL 3   levothyroxine  (SYNTHROID) 50 MCG tablet Take 1 tablet (50 mcg total) by mouth daily before breakfast. 90 tablet 3   losartan (COZAAR) 50 MG tablet Take 1 tablet (50 mg total) by mouth at bedtime. 90 tablet 3   magic mouthwash w/lidocaine SOLN Take 5 mLs by mouth 3 (three) times daily as needed for mouth pain. 150 mL 1   MAGNESIUM PO Take 250 mg by mouth daily.     meclizine (ANTIVERT) 25 MG tablet Take 1 tablet (25 mg total) by mouth 3 (three) times daily as needed (vertigo). 84 tablet 3   metoprolol succinate (TOPROL-XL) 50 MG 24 hr tablet Take 1 tablet (50 mg total) by mouth daily. 90 tablet 3   montelukast (SINGULAIR) 10 MG tablet Take 1 tablet (10 mg total) by mouth at bedtime. 90 tablet 3   Multiple Vitamin (MULTIVITAMIN) tablet Take 1 tablet by mouth daily.     mupirocin ointment (BACTROBAN) 2 % Apply topically.     ondansetron (ZOFRAN ODT) 4 MG disintegrating tablet Take 1 tablet (4 mg total) by mouth every 8 (eight) hours as needed for nausea or vomiting. 30 tablet 2   OneTouch Delica Lancets 62X MISC Used to check blood sugars twice daly. 100 each 5   Propylene Glycol 0.6 % SOLN Apply 1 drop to eye as needed.     psyllium (METAMUCIL) 58.6 % powder Take 1 packet by mouth as needed.     Syringe/Needle, Disp, (SYRINGE 3CC/20GX1") 20G X 1" 3 ML MISC 1 Syringe by Does not apply route every 30 (thirty) days. For use with b-12 injection monthly 50 each 0   Tiotropium Bromide Monohydrate (SPIRIVA RESPIMAT) 1.25 MCG/ACT AERS Inhale 2 puffs into the lungs daily. 12 g 3   tretinoin (RETIN-A) 0.05 % cream Apply topically at bedtime. 45 g 1   urea (CARMOL) 40 % CREA Apply topically.     No current facility-administered medications for this visit.    Allergies as of 08/15/2022 - Review Complete 08/15/2022  Allergen Reaction Noted   Codeine Other (See Comments)    Qvar [beclomethasone] Other (See Comments) 05/27/2022   Sulfonamide derivatives Rash     Family History  Problem Relation Age of Onset   Cancer  Mother 22       lung, passive smoke   Cancer Father 64  lung, smoker   COPD Sister    Stroke Sister        TIA   Hypertension Sister    Heart failure Sister    Aortic dissection Maternal Aunt    Breast cancer Maternal Aunt    Cancer Paternal Aunt        breast, lung   Aortic dissection Paternal Uncle    Heart attack Paternal Uncle    AAA (abdominal aortic aneurysm) Paternal Uncle    Heart disease Paternal Uncle    Stroke Paternal Uncle    Cancer Cousin        breast   Colon cancer Neg Hx    Rectal cancer Neg Hx    Stomach cancer Neg Hx     Social History   Socioeconomic History   Marital status: Married    Spouse name: Not on file   Number of children: Not on file   Years of education: Not on file   Highest education level: Not on file  Occupational History   Occupation: retired  Tobacco Use   Smoking status: Former    Packs/day: 0.30    Years: 7.00    Total pack years: 2.10    Types: Cigarettes    Quit date: 10/27/1977    Years since quitting: 44.8    Passive exposure: Past   Smokeless tobacco: Never   Tobacco comments:    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 Hx of children who are grown  Vaping Use   Vaping Use: Never used  Substance and Sexual Activity   Alcohol use: Not Currently    Alcohol/week: 0.0 standard drinks of alcohol   Drug use: No   Sexual activity: Not on file  Other Topics Concern   Not on file  Social History Narrative   Lives with husband    Grown children   Occ: retired, was Gaffer   Edu: trade degree then 2 yrscollege   Social Determinants of Health   Financial Resource Strain: [place]  (05/28/2022)   Overall Financial Resource Strain (CARDIA)    Difficulty of Paying Living Expenses: Not hard at all  Food Insecurity: No Food Insecurity (05/28/2022)   Hunger Vital Sign    Worried About Running Out of Food in the Last Year: Never true    [place] in the Last Year: Never true  Transportation  Needs: No Transportation Needs (05/28/2022)   PRAPARE - Hydrologist (Medical): No    Lack of Transportation (Non-Medical): No  Physical Activity: Sufficiently Active (05/28/2022)   Exercise Vital Sign    Days of Exercise per Week: 5 days    Minutes of Exercise per Session: 30 min  Stress: No Stress Concern Present (05/28/2022)   [place]    Feeling of Stress : Not at all  Social Connections: Unknown (05/28/2022)   Social Connection and Isolation Panel [NHANES]    Frequency of Communication with Friends and Family: More than three times a week    Frequency of Social Gatherings with Friends and Family: More than three times a week    Attends Religious Services: Not on file    Active Member of Clubs or Organizations: Not on file    Attends Archivist Meetings: Not on file    Marital Status: Married  Intimate Partner Violence: Not At Risk (05/28/2022)   Humiliation, Afraid, Rape, and Kick questionnaire    Fear  of Current or Ex-Partner: No    Emotionally Abused: No    Physically Abused: No    Sexually Abused: No    Review of Systems: 12 system ROS is negative except as noted above with the addition of allergies, anxiety, arthritis, cough, fatigue, itching on the back, urine leakage.   Physical Exam: General:   Alert,  well-nourished, pleasant and cooperative in NAD Head:  Normocephalic and atraumatic. Eyes:  Sclera clear, no icterus.   Conjunctiva pink. Ears:  Normal auditory acuity. Nose:  No deformity, discharge,  or lesions. Mouth:  No deformity or lesions.   Neck:  Supple; no masses or thyromegaly. Lungs:  Clear throughout to auscultation.   No wheezes. Heart:  Regular rate and rhythm; no murmurs. Abdomen:  Soft, nontender, nondistended, normal bowel sounds, no rebound or guarding. No hepatosplenomegaly.   Rectal:  Deferred to colonoscopy Msk:  Symmetrical. No boney  deformities LAD: No inguinal or umbilical LAD Extremities:  No clubbing or edema. Neurologic:  Alert and  oriented x4;  grossly nonfocal Skin:  Intact without significant lesions or rashes. Psych:  Alert and cooperative. Normal mood and affect.  I spent 49 minutes, including in depth chart review, independent review of results, face-to-face time with the patient, coordinating care, ordering studies and medications as appropriate, and documentation.     Tiffanny Lamarche L. Tarri Glenn, MD, MPH 08/15/2022, 1:39 PM

## 2022-08-15 NOTE — Telephone Encounter (Signed)
Called and left voicemail for patient that cpap order was received this morning from [facility] and the mask fitting order as well. I advised her to wait a few more days before it gets changed. Nothing further needed

## 2022-08-15 NOTE — Patient Instructions (Addendum)
It was my pleasure to provide care to you today. Based on our discussion, I am providing you with my recommendations below:  RECOMMENDATION(S):   EGD and colonoscopy recommended.  Follow a high fiber diet or use fiber supplements on a regular basis.  There is no need to avoid seeds, nuts, corn, and berries.  Non-steroidal antiinflammatory medications (such as ibuprofen, naproxen, etc) may increase your risk for a recurrent of diverticulitis. Please avoid these medications whenever possible.    COLONOSCOPY:   You have been scheduled for a colonoscopy. Please follow written instructions given to you at your visit today.   PREP:   Please pick up your prep supplies at the pharmacy within the next 1-3 days.  INHALERS:   If you use inhalers (even only as needed), please bring them with you on the day of your procedure.  COLONOSCOPY TIPS:  To reduce nausea and dehydration, stay well hydrated for 3-4 days prior to the exam.  To prevent skin/hemorrhoid irritation - prior to wiping, put A&Dointment or vaseline on the toilet paper. Keep a towel or pad on the bed.  BEFORE STARTING YOUR PREP, drink  64oz of clear liquids in the morning. This will help to flush the colon and will ensure you are well hydrated!!!!  NOTE - This is in addition to the fluids required for to complete your prep. Use of a flavored hard candy, such as grape Anise Salvo, can counteract some of the flavor of the prep and may prevent some nausea.    FOLLOW UP:  After your procedure, you will receive a call from my office staff regarding my recommendation for follow up.  BMI:  If you are age 67 or older, your body mass index should be between 23-30. Your Body mass index is 30.12 kg/m. If this is out of the aforementioned range listed, please consider follow up with your Primary Care Provider.  If you are age 52 or younger, your body mass index should be between 19-25. Your Body mass index is 30.12 kg/m. If this is  out of the aformentioned range listed, please consider follow up with your Primary Care Provider.   MY CHART:  The [facility] providers would like to encourage you to use [facility] to communicate with providers for non-urgent requests or questions.  Due to long hold times on the telephone, sending your provider a message by [facility] may be a faster and more efficient way to get a response.  Please allow 48 business hours for a response.  Please remember that this is for non-urgent requests.   Thank you for trusting me with your gastrointestinal care!    Thornton Park, MD, MPH

## 2022-08-18 ENCOUNTER — Telehealth: Payer: Self-pay | Admitting: Primary Care

## 2022-08-18 ENCOUNTER — Ambulatory Visit: Payer: Medicare Other | Admitting: Pulmonary Disease

## 2022-08-18 DIAGNOSIS — N3946 Mixed incontinence: Secondary | ICD-10-CM | POA: Diagnosis not present

## 2022-08-18 DIAGNOSIS — R300 Dysuria: Secondary | ICD-10-CM | POA: Diagnosis not present

## 2022-08-18 DIAGNOSIS — Z01818 Encounter for other preprocedural examination: Secondary | ICD-10-CM | POA: Diagnosis not present

## 2022-08-18 DIAGNOSIS — N993 Prolapse of vaginal vault after hysterectomy: Secondary | ICD-10-CM | POA: Diagnosis not present

## 2022-08-18 DIAGNOSIS — N390 Urinary tract infection, site not specified: Secondary | ICD-10-CM | POA: Diagnosis not present

## 2022-08-18 MED ORDER — CHLORPHENIRAMINE MALEATE 4 MG PO TABS: 4.0000 mg | ORAL_TABLET | ORAL | 0 refills | Status: DC | PRN

## 2022-08-18 MED ORDER — BENZONATATE 200 MG PO CAPS: 200.0000 mg | ORAL_CAPSULE | Freq: Three times a day (TID) | ORAL | 1 refills | Status: DC | PRN

## 2022-08-18 NOTE — Telephone Encounter (Signed)
-----   Message from Chesley Mires, MD sent at 08/14/2022 11:23 AM EDT ----- Regarding: RE: Cough- some better We could try tramadol at night.  If her cough is better, then she can proceed with vaginal sling procedure.  [place]  ----- Message ----- From: Martyn Ehrich, NP Sent: 08/13/2022  12:39 PM EDT To: Chesley Mires, MD Subject: Cough- some better                             Hi Dr. Halford Chessman This is a patient of yours with upper airway cough. You started her on chlorpheniramine at night and atrovent nasal spray TID. Her cough is a good deal better but still there. She is unsure if she should reschedule urologic procedure that she is scheduled to have in November- before doing that I was wondering if you would be ok if we tried either gabapentin or tramadol for cough. She is doing everything you recommended  Pressure changes to CPAP caused her to have more fatigue, headaches and tachycardia. I am re-adjusting this   -[employer]

## 2022-08-18 NOTE — Telephone Encounter (Signed)
Nasal spray is helping a lot  She wants to avoid using tramadol, they will be giving her something for pain after procedure  I told her she can take chlor-tabs '4mg'$  every 4-6 hours as she was only taking at night Refilling tessalon perles

## 2022-08-19 ENCOUNTER — Telehealth: Payer: Self-pay | Admitting: Gastroenterology

## 2022-08-19 NOTE — Telephone Encounter (Signed)
Patient called in stating she is taking AZO for bladder pain for an upcoming surgery in November & per her urologist she was told to call and make Dr. Tarri Glenn aware. Patient advised to continue to take medication as prescribed.

## 2022-08-19 NOTE — Telephone Encounter (Signed)
Patient called states that she is taking some medication for her bladder, states she would like to know if it's okay for her to continue taking to medication. Procedure is 10/27. Please call to advise.

## 2022-08-20 ENCOUNTER — Telehealth: Payer: Self-pay | Admitting: Internal Medicine

## 2022-08-20 NOTE — Telephone Encounter (Signed)
Pt wanted to update provider with her vaccinations  RSV-07/25/22  Flu shot-08/15/22

## 2022-08-20 NOTE — Telephone Encounter (Signed)
Noted. They are in the chart already

## 2022-08-21 ENCOUNTER — Encounter: Payer: Self-pay | Admitting: Internal Medicine

## 2022-08-22 ENCOUNTER — Encounter: Payer: Self-pay | Admitting: Gastroenterology

## 2022-08-22 ENCOUNTER — Ambulatory Visit (AMBULATORY_SURGERY_CENTER): Payer: Medicare Other | Admitting: Gastroenterology

## 2022-08-22 VITALS — BP 130/78 | HR 88 | Temp 98.0°F | Resp 18 | Ht 65.0 in | Wt 181.0 lb

## 2022-08-22 DIAGNOSIS — K219 Gastro-esophageal reflux disease without esophagitis: Secondary | ICD-10-CM

## 2022-08-22 DIAGNOSIS — K317 Polyp of stomach and duodenum: Secondary | ICD-10-CM | POA: Diagnosis not present

## 2022-08-22 DIAGNOSIS — K2970 Gastritis, unspecified, without bleeding: Secondary | ICD-10-CM | POA: Diagnosis not present

## 2022-08-22 DIAGNOSIS — K31819 Angiodysplasia of stomach and duodenum without bleeding: Secondary | ICD-10-CM | POA: Diagnosis not present

## 2022-08-22 DIAGNOSIS — D122 Benign neoplasm of ascending colon: Secondary | ICD-10-CM | POA: Diagnosis not present

## 2022-08-22 DIAGNOSIS — D128 Benign neoplasm of rectum: Secondary | ICD-10-CM

## 2022-08-22 DIAGNOSIS — K621 Rectal polyp: Secondary | ICD-10-CM

## 2022-08-22 DIAGNOSIS — Z1211 Encounter for screening for malignant neoplasm of colon: Secondary | ICD-10-CM | POA: Diagnosis not present

## 2022-08-22 MED ORDER — SODIUM CHLORIDE 0.9 % IV SOLN: 500.0000 mL | Freq: Once | INTRAVENOUS | Status: DC

## 2022-08-22 NOTE — Op Note (Signed)
[facility] Patient Name: Angela Mendoza Procedure Date: 08/22/2022 1:11 PM MRN: [id number] Endoscopist: Thornton Park MD, MD, [id number] Age: 71 Referring MD:  Date of Birth: 11/17/1950 Gender: Female Account #: [id number] Procedure:                Colonoscopy Indications:              Screening for colorectal malignant neoplasm                           Colonoscopy with Dr. Leonidas Romberg 09/02/2012: Normal                            except for sigmoid diverticulosis Medicines:                Monitored Anesthesia Care Procedure:                Pre-Anesthesia Assessment:                           - Prior to the procedure, a History and Physical                            was performed, and patient medications and                            allergies were reviewed. The patient's tolerance of                            previous anesthesia was also reviewed. The risks                            and benefits of the procedure and the sedation                            options and risks were discussed with the patient.                            All questions were answered, and informed consent                            was obtained. Prior Anticoagulants: The patient has                            taken no anticoagulant or antiplatelet agents. ASA                            Grade Assessment: II - A patient with mild systemic                            disease. After reviewing the risks and benefits,                            the patient was deemed in satisfactory condition to  undergo the procedure.                           After obtaining informed consent, the colonoscope                            was passed under direct vision. Throughout the                            procedure, the patient's blood pressure, pulse, and                            oxygen saturations were monitored continuously. The                            CF [id number] #[id number] was  introduced through the anus                            and advanced to the 3 cm into the ileum. A second                            forward view of the right colon was performed. The                            colonoscopy was performed without difficulty. The                            patient tolerated the procedure well. The quality                            of the bowel preparation was good. The terminal                            ileum, ileocecal valve, appendiceal orifice, and                            rectum were photographed. Scope In: 1:39:10 PM Scope Out: 1:59:42 PM Scope Withdrawal Time: 0 hours 8 minutes 56 seconds  Total Procedure Duration: 0 hours 20 minutes 32 seconds  Findings:                 The perianal and digital rectal examinations were                            normal.                           Non-bleeding internal hemorrhoids were found.                           Many large-mouthed, medium-mouthed and                            small-mouthed diverticula were found in the entire  colon.                           A 3 mm polyp was found in the rectum. The polyp was                            sessile. The polyp was removed with a cold snare.                            Resection and retrieval were complete. Estimated                            blood loss was minimal.                           A 3 mm polyp was found in the ascending colon. The                            polyp was sessile. The polyp was removed with a                            cold snare. Resection and retrieval were complete.                            Estimated blood loss was minimal.                           The exam was otherwise without abnormality on                            direct and retroflexion views. Complications:            No immediate complications. Estimated Blood Loss:     Estimated blood loss was minimal. Impression:               - Non-bleeding  internal hemorrhoids.                           - Diverticulosis in the entire examined colon.                           - One 3 mm polyp in the rectum, removed with a cold                            snare. Resected and retrieved.                           - One 3 mm polyp in the ascending colon, removed                            with a cold snare. Resected and retrieved.                           - The examination was otherwise normal on direct  and retroflexion views. Recommendation:           - Patient has a contact number available for                            emergencies. The signs and symptoms of potential                            delayed complications were discussed with the                            patient. Return to normal activities tomorrow.                            Written discharge instructions were provided to the                            patient.                           - Resume previous diet.                           - Continue present medications.                           - Await pathology results.                           - Repeat colonoscopy date to be determined after                            pending pathology results are reviewed for                            surveillance.                           - Emerging evidence supports eating a diet of                            fruits, vegetables, grains, calcium, and yogurt                            while reducing red meat and alcohol may reduce the                            risk of colon cancer.                           - Follow a high fiber diet. Drink at least 64                            ounces of water daily. Add a daily stool bulking  agent such as psyllium (an exampled would be                            Metamucil).                           - Thank you for allowing me to be involved in your                            colon cancer  prevention. Thornton Park MD, MD 08/22/2022 2:13:41 PM This report has been signed electronically.

## 2022-08-22 NOTE — Progress Notes (Signed)
A and O x3. Report to RN. Tolerated MAC anesthesia well.Teeth unchanged after procedure. 

## 2022-08-22 NOTE — Patient Instructions (Addendum)
-Handout on polyps, diverticulosis and hemorrhoids. - Patient has a contact number available for emergencies. The signs and symptoms of potential delayed complications were discussed with the patient. Return to normal activities tomorrow. Written discharge instructions were provided to the patient. - Resume previous diet. - Continue present medications. - Await pathology results. - No aspirin, ibuprofen, naproxen, or other non-steroidal anti-inflammatory drugs. - Repeat colonoscopy date to be determined after pending pathology results are reviewed for surveillance. - Emerging evidence supports eating a diet of fruits, vegetables, grains, calcium, and yogurt while reducing red meat and alcohol may reduce the risk of colon cancer. - Follow a high fiber diet. Drink at least 64 ounces of water daily. Add a daily stool bulking agent such as psyllium (an exampled would be Metamucil). - Thank you for allowing me to be involved in your colon cancer prevention.  YOU HAD AN ENDOSCOPIC PROCEDURE TODAY AT [facility]:   Refer to the procedure report that was given to you for any specific questions about what was found during the examination.  If the procedure report does not answer your questions, please call your gastroenterologist to clarify.  If you requested that your care partner not be given the details of your procedure findings, then the procedure report has been included in a sealed envelope for you to review at your convenience later.  YOU SHOULD EXPECT: Some feelings of bloating in the abdomen. Passage of more gas than usual.  Walking can help get rid of the air that was put into your GI tract during the procedure and reduce the bloating. If you had a lower endoscopy (such as a colonoscopy or flexible sigmoidoscopy) you may notice spotting of blood in your stool or on the toilet paper. If you underwent a bowel prep for your procedure, you may not have a normal bowel movement for a few  days.  Please Note:  You might notice some irritation and congestion in your nose or some drainage.  This is from the oxygen used during your procedure.  There is no need for concern and it should clear up in a day or so.  SYMPTOMS TO REPORT IMMEDIATELY:  Following lower endoscopy (colonoscopy or flexible sigmoidoscopy):  Excessive amounts of blood in the stool  Significant tenderness or worsening of abdominal pains  Swelling of the abdomen that is new, acute  Fever of 100F or higher  Following upper endoscopy (EGD)  Vomiting of blood or coffee ground material  New chest pain or pain under the shoulder blades  Painful or persistently difficult swallowing  New shortness of breath  Fever of 100F or higher  Black, tarry-looking stools  For urgent or emergent issues, a gastroenterologist can be reached at any hour by calling [phone]. Do not use MyChart messaging for urgent concerns.    DIET:  We do recommend a small meal at first, but then you may proceed to your regular diet.  Drink plenty of fluids but you should avoid alcoholic beverages for 24 hours.  ACTIVITY:  You should plan to take it easy for the rest of today and you should NOT DRIVE or use heavy machinery until tomorrow (because of the sedation medicines used during the test).    FOLLOW UP: Our staff will call the number listed on your records the next business day following your procedure.  We will call around 7:15- 8:00 am to check on you and address any questions or concerns that you may have regarding the information given  to you following your procedure. If we do not reach you, we will leave a message.     If any biopsies were taken you will be contacted by phone or by letter within the next 1-3 weeks.  Please call us at [phone] if you have not heard about the biopsies in 3 weeks.    SIGNATURES/CONFIDENTIALITY: You and/or your care partner have signed paperwork which will be entered into your electronic  medical record.  These signatures attest to the fact that that the information above on your After Visit Summary has been reviewed and is understood.  Full responsibility of the confidentiality of this discharge information lies with you and/or your care-partner.

## 2022-08-22 NOTE — Progress Notes (Signed)
Indication for EGD: Longstanding reflux with chronic cough Indication for colonoscopy: Colon cancer screening  Please see my 08/15/2022 office note for complete details.  There is been no change in history or physical exam since that time.  The patient remains an appropriate candidate for monitored anesthesia care in the endoscopy center.

## 2022-08-22 NOTE — Op Note (Signed)
[facility] Patient Name: Angela Mendoza Procedure Date: 08/22/2022 1:12 PM MRN: [id number] Endoscopist: Thornton Park MD, MD, [id number] Age: 71 Referring MD:  Date of Birth: 05-14-1951 Gender: Female Account #: [id number] Procedure:                Upper GI endoscopy Indications:              Chronic cough Medicines:                Monitored Anesthesia Care Procedure:                Pre-Anesthesia Assessment:                           - Prior to the procedure, a History and Physical                            was performed, and patient medications and                            allergies were reviewed. The patient's tolerance of                            previous anesthesia was also reviewed. The risks                            and benefits of the procedure and the sedation                            options and risks were discussed with the patient.                            All questions were answered, and informed consent                            was obtained. Prior Anticoagulants: The patient has                            taken no anticoagulant or antiplatelet agents. ASA                            Grade Assessment: II - A patient with mild systemic                            disease. After reviewing the risks and benefits,                            the patient was deemed in satisfactory condition to                            undergo the procedure.                           After obtaining informed consent, the endoscope was  passed under direct vision. Throughout the                            procedure, the patient's blood pressure, pulse, and                            oxygen saturations were monitored continuously. The                            Endoscope was introduced through the mouth, and                            advanced to the third part of duodenum. The upper                            GI endoscopy was accomplished  without difficulty.                            The patient tolerated the procedure well. Scope In: Scope Out: Findings:                 Localized mild mucosal changes characterized by                            congestion were found in the lower third of the                            esophagus. The z-line is located 40 cm from the                            incisors. Biopsies were obtained from the                            mid/proximal and distal esophagus with cold forceps                            for histology.                           Localized moderate inflammation characterized by                            erythema, friability and granularity was found in                            the cardia, in the gastric body and in the gastric                            antrum. Biopsies were taken from the antrum, body,                            and fundus with a cold forceps for histology.  Estimated blood loss was minimal.                           Multiple sessile polyps with no stigmata of recent                            bleeding were found in the gastric fundus and in                            the gastric body. The appearance is of fundic gland                            polyps. Biopsies were taken with a cold forceps for                            histology to exclude adenoma. Estimated blood loss                            was minimal.                           The examined duodenum was normal. Complications:            No immediate complications. Estimated Blood Loss:     Estimated blood loss was minimal. Impression:               - Congested mucosa in the esophagus.                           - Gastritis. Biopsied.                           - Multiple gastric polyps. Biopsied.                           - Normal examined duodenum.                           - Biopsies were taken with a cold forceps for                            evaluation of  eosinophilic esophagitis. Recommendation:           - Patient has a contact number available for                            emergencies. The signs and symptoms of potential                            delayed complications were discussed with the                            patient. Return to normal activities tomorrow.                            Written discharge instructions were provided to the  patient.                           - Resume previous diet.                           - Continue present medications.                           - No aspirin, ibuprofen, naproxen, or other                            non-steroidal anti-inflammatory drugs.                           - Await pathology results. Thornton Park MD, MD 08/22/2022 2:09:34 PM This report has been signed electronically.

## 2022-08-22 NOTE — Progress Notes (Signed)
Pt had several moles "burned off" and "cut out"

## 2022-08-25 ENCOUNTER — Encounter (INDEPENDENT_AMBULATORY_CARE_PROVIDER_SITE_OTHER): Payer: Self-pay

## 2022-08-25 ENCOUNTER — Telehealth: Payer: Self-pay | Admitting: *Deleted

## 2022-08-25 NOTE — Telephone Encounter (Signed)
  Follow up Call-     08/22/2022   12:50 PM  Call back number  Post procedure Call Back phone  # [phone]  Permission to leave phone message Yes     Patient questions:  Do you have a fever, pain , or abdominal swelling? No. Pain Score  0 *  Have you tolerated food without any problems? Yes.    Have you been able to return to your normal activities? Yes.    Do you have any questions about your discharge instructions: Diet   No. Medications  No. Follow up visit  No.  Do you have questions or concerns about your Care? No.  Actions: * If pain score is 4 or above: No action needed, pain <4.

## 2022-08-27 HISTORY — PX: OTHER SURGICAL HISTORY: SHX169

## 2022-08-29 ENCOUNTER — Encounter: Payer: Self-pay | Admitting: Gastroenterology

## 2022-09-05 ENCOUNTER — Telehealth: Payer: Self-pay | Admitting: Cardiovascular Disease

## 2022-09-05 DIAGNOSIS — I10 Essential (primary) hypertension: Secondary | ICD-10-CM

## 2022-09-05 MED ORDER — LOSARTAN POTASSIUM 50 MG PO TABS: 50.0000 mg | ORAL_TABLET | Freq: Every day | ORAL | 1 refills | Status: DC

## 2022-09-05 MED ORDER — METOPROLOL SUCCINATE ER 50 MG PO TB24: 50.0000 mg | ORAL_TABLET | Freq: Every day | ORAL | 1 refills | Status: DC

## 2022-09-05 NOTE — Telephone Encounter (Signed)
Attempted to contact pt, but her phone does not allow blocked callers. Someone will need to call from the office.

## 2022-09-05 NOTE — Telephone Encounter (Signed)
Patient returning call. She says she will take the block off her phone.

## 2022-09-05 NOTE — Telephone Encounter (Signed)
STAT if HR is under 50 or over 120 (normal HR is 60-100 beats per minute)  What is your heart rate?   Do you have a log of your heart rate readings (document readings)?   Do you have any other symptoms?    Past few nights patient has been waking up with HR around 101-103. This morning when she got up her HR was 103 just after going to the restroom. After sitting on the bed for a while it was in the 90's. She states she took a xanax and has been continuing to rest in bed and HR has dropped down to the 80's. She states she feels stressed/anxious overall because she will be seeing a provider at [facility].

## 2022-09-05 NOTE — Telephone Encounter (Signed)
Patient is returning call. Advised someone from the office will be calling so it goes through.

## 2022-09-05 NOTE — Telephone Encounter (Signed)
Attempted to contact pt from blocked #, but it is still not going thru. Will need someone from the office to call.

## 2022-09-05 NOTE — Telephone Encounter (Signed)
Spoke with patient and she stated that she is having a lot of anxiety over upcoming surgery at [facility], and the fact that they are having to pack their house. She is feeling anxious all the time.   She stated that she "believes the pill packs she just received were filled with expired meds because they do not seem to be working". She said her HR is about 113 when walking around and she said xanax is the only thing that helps. She wanted to know if she could take an extra dose of her xanax. I advised that she reach out to Dr. Derrel Nip to inquire about that because we do not prescribe that here. I informed her she could try and extra half a tablet of her metoprolol succinate (she takes 50 MG daily) if her heart rate is over 110 until she hears back from [place] with Dr. Donivan Scull advise.  She requested we send refills of her Metoprolol Succinate and Losartan to [facility] on [address] Because she does not trust her pill packs. I informed her that I would send as requested and send this message to Dr. Rockey Situ for review.  Patient is worried she might be in Afib. We discussed possible home devices for monitoring her heart. Patient stated that she will look into the [place] watch because she owns an Android.   Also discussed if her heart rate increased or remained elevated, or she developed any accompanying symptoms like SOB, she should go to the ED for an Evaluation.  Patient will await feedback from Dr. Rockey Situ.

## 2022-09-05 NOTE — Telephone Encounter (Signed)
Attempted again to reach pt, but it will not accept my call.

## 2022-09-09 DIAGNOSIS — N3946 Mixed incontinence: Secondary | ICD-10-CM | POA: Diagnosis not present

## 2022-09-09 DIAGNOSIS — E039 Hypothyroidism, unspecified: Secondary | ICD-10-CM | POA: Diagnosis not present

## 2022-09-09 DIAGNOSIS — N993 Prolapse of vaginal vault after hysterectomy: Secondary | ICD-10-CM | POA: Diagnosis not present

## 2022-09-09 DIAGNOSIS — N390 Urinary tract infection, site not specified: Secondary | ICD-10-CM | POA: Diagnosis not present

## 2022-09-09 DIAGNOSIS — J4520 Mild intermittent asthma, uncomplicated: Secondary | ICD-10-CM | POA: Diagnosis not present

## 2022-09-09 DIAGNOSIS — G4733 Obstructive sleep apnea (adult) (pediatric): Secondary | ICD-10-CM | POA: Diagnosis not present

## 2022-09-09 DIAGNOSIS — I10 Essential (primary) hypertension: Secondary | ICD-10-CM | POA: Diagnosis not present

## 2022-09-09 DIAGNOSIS — Z01818 Encounter for other preprocedural examination: Secondary | ICD-10-CM | POA: Diagnosis not present

## 2022-09-09 DIAGNOSIS — F411 Generalized anxiety disorder: Secondary | ICD-10-CM | POA: Diagnosis not present

## 2022-09-09 DIAGNOSIS — Z8659 Personal history of other mental and behavioral disorders: Secondary | ICD-10-CM | POA: Diagnosis not present

## 2022-09-09 DIAGNOSIS — K219 Gastro-esophageal reflux disease without esophagitis: Secondary | ICD-10-CM | POA: Diagnosis not present

## 2022-09-16 DIAGNOSIS — E559 Vitamin D deficiency, unspecified: Secondary | ICD-10-CM | POA: Diagnosis not present

## 2022-09-16 DIAGNOSIS — Z882 Allergy status to sulfonamides status: Secondary | ICD-10-CM | POA: Diagnosis not present

## 2022-09-16 DIAGNOSIS — Z9989 Dependence on other enabling machines and devices: Secondary | ICD-10-CM | POA: Diagnosis not present

## 2022-09-16 DIAGNOSIS — Z87891 Personal history of nicotine dependence: Secondary | ICD-10-CM | POA: Diagnosis not present

## 2022-09-16 DIAGNOSIS — N736 Female pelvic peritoneal adhesions (postinfective): Secondary | ICD-10-CM | POA: Diagnosis not present

## 2022-09-16 DIAGNOSIS — D649 Anemia, unspecified: Secondary | ICD-10-CM | POA: Diagnosis not present

## 2022-09-16 DIAGNOSIS — K2100 Gastro-esophageal reflux disease with esophagitis, without bleeding: Secondary | ICD-10-CM | POA: Diagnosis not present

## 2022-09-16 DIAGNOSIS — J4520 Mild intermittent asthma, uncomplicated: Secondary | ICD-10-CM | POA: Diagnosis not present

## 2022-09-16 DIAGNOSIS — Z90722 Acquired absence of ovaries, bilateral: Secondary | ICD-10-CM | POA: Diagnosis not present

## 2022-09-16 DIAGNOSIS — F411 Generalized anxiety disorder: Secondary | ICD-10-CM | POA: Diagnosis not present

## 2022-09-16 DIAGNOSIS — E039 Hypothyroidism, unspecified: Secondary | ICD-10-CM | POA: Diagnosis not present

## 2022-09-16 DIAGNOSIS — E782 Mixed hyperlipidemia: Secondary | ICD-10-CM | POA: Diagnosis not present

## 2022-09-16 DIAGNOSIS — Z85828 Personal history of other malignant neoplasm of skin: Secondary | ICD-10-CM | POA: Diagnosis not present

## 2022-09-16 DIAGNOSIS — G4733 Obstructive sleep apnea (adult) (pediatric): Secondary | ICD-10-CM | POA: Diagnosis not present

## 2022-09-16 DIAGNOSIS — K5730 Diverticulosis of large intestine without perforation or abscess without bleeding: Secondary | ICD-10-CM | POA: Diagnosis not present

## 2022-09-16 DIAGNOSIS — Z8719 Personal history of other diseases of the digestive system: Secondary | ICD-10-CM | POA: Diagnosis not present

## 2022-09-16 DIAGNOSIS — Z9889 Other specified postprocedural states: Secondary | ICD-10-CM | POA: Diagnosis not present

## 2022-09-16 DIAGNOSIS — Z90710 Acquired absence of both cervix and uterus: Secondary | ICD-10-CM | POA: Diagnosis not present

## 2022-09-16 DIAGNOSIS — Z9079 Acquired absence of other genital organ(s): Secondary | ICD-10-CM | POA: Diagnosis not present

## 2022-09-16 DIAGNOSIS — N3946 Mixed incontinence: Secondary | ICD-10-CM | POA: Diagnosis not present

## 2022-09-16 DIAGNOSIS — I10 Essential (primary) hypertension: Secondary | ICD-10-CM | POA: Diagnosis not present

## 2022-09-16 DIAGNOSIS — Z888 Allergy status to other drugs, medicaments and biological substances status: Secondary | ICD-10-CM | POA: Diagnosis not present

## 2022-09-16 DIAGNOSIS — Z885 Allergy status to narcotic agent status: Secondary | ICD-10-CM | POA: Diagnosis not present

## 2022-09-16 DIAGNOSIS — K219 Gastro-esophageal reflux disease without esophagitis: Secondary | ICD-10-CM | POA: Diagnosis not present

## 2022-09-16 DIAGNOSIS — I272 Pulmonary hypertension, unspecified: Secondary | ICD-10-CM | POA: Diagnosis not present

## 2022-09-16 DIAGNOSIS — N393 Stress incontinence (female) (male): Secondary | ICD-10-CM | POA: Diagnosis not present

## 2022-09-16 DIAGNOSIS — Z8659 Personal history of other mental and behavioral disorders: Secondary | ICD-10-CM | POA: Diagnosis not present

## 2022-09-16 DIAGNOSIS — J309 Allergic rhinitis, unspecified: Secondary | ICD-10-CM | POA: Diagnosis not present

## 2022-09-16 DIAGNOSIS — Z9049 Acquired absence of other specified parts of digestive tract: Secondary | ICD-10-CM | POA: Diagnosis not present

## 2022-09-16 DIAGNOSIS — N993 Prolapse of vaginal vault after hysterectomy: Secondary | ICD-10-CM | POA: Diagnosis not present

## 2022-09-16 DIAGNOSIS — Z791 Long term (current) use of non-steroidal anti-inflammatories (NSAID): Secondary | ICD-10-CM | POA: Diagnosis not present

## 2022-09-17 DIAGNOSIS — N393 Stress incontinence (female) (male): Secondary | ICD-10-CM | POA: Diagnosis not present

## 2022-09-17 DIAGNOSIS — I10 Essential (primary) hypertension: Secondary | ICD-10-CM | POA: Diagnosis not present

## 2022-09-17 DIAGNOSIS — J309 Allergic rhinitis, unspecified: Secondary | ICD-10-CM | POA: Diagnosis not present

## 2022-09-17 DIAGNOSIS — N993 Prolapse of vaginal vault after hysterectomy: Secondary | ICD-10-CM | POA: Diagnosis not present

## 2022-09-17 DIAGNOSIS — N736 Female pelvic peritoneal adhesions (postinfective): Secondary | ICD-10-CM | POA: Diagnosis not present

## 2022-09-17 DIAGNOSIS — K5730 Diverticulosis of large intestine without perforation or abscess without bleeding: Secondary | ICD-10-CM | POA: Diagnosis not present

## 2022-09-22 DIAGNOSIS — N3289 Other specified disorders of bladder: Secondary | ICD-10-CM | POA: Diagnosis not present

## 2022-10-02 DIAGNOSIS — H10501 Unspecified blepharoconjunctivitis, right eye: Secondary | ICD-10-CM | POA: Diagnosis not present

## 2022-10-03 ENCOUNTER — Ambulatory Visit: Payer: Medicare Other

## 2022-10-07 NOTE — Telephone Encounter (Signed)
MyChart messgae sent to patient. 

## 2022-10-08 DIAGNOSIS — Z801 Family history of malignant neoplasm of trachea, bronchus and lung: Secondary | ICD-10-CM | POA: Diagnosis not present

## 2022-10-08 DIAGNOSIS — Z9089 Acquired absence of other organs: Secondary | ICD-10-CM | POA: Diagnosis not present

## 2022-10-08 DIAGNOSIS — R339 Retention of urine, unspecified: Secondary | ICD-10-CM | POA: Diagnosis not present

## 2022-10-08 DIAGNOSIS — Z90710 Acquired absence of both cervix and uterus: Secondary | ICD-10-CM | POA: Diagnosis not present

## 2022-10-08 DIAGNOSIS — I272 Pulmonary hypertension, unspecified: Secondary | ICD-10-CM | POA: Diagnosis not present

## 2022-10-08 DIAGNOSIS — I10 Essential (primary) hypertension: Secondary | ICD-10-CM | POA: Diagnosis not present

## 2022-10-08 DIAGNOSIS — R7303 Prediabetes: Secondary | ICD-10-CM | POA: Diagnosis not present

## 2022-10-08 DIAGNOSIS — Z800 Family history of malignant neoplasm of digestive organs: Secondary | ICD-10-CM | POA: Diagnosis not present

## 2022-10-08 DIAGNOSIS — Z79899 Other long term (current) drug therapy: Secondary | ICD-10-CM | POA: Diagnosis not present

## 2022-10-08 DIAGNOSIS — Z9889 Other specified postprocedural states: Secondary | ICD-10-CM | POA: Diagnosis not present

## 2022-10-08 DIAGNOSIS — Z8249 Family history of ischemic heart disease and other diseases of the circulatory system: Secondary | ICD-10-CM | POA: Diagnosis not present

## 2022-10-08 DIAGNOSIS — Z79890 Hormone replacement therapy (postmenopausal): Secondary | ICD-10-CM | POA: Diagnosis not present

## 2022-10-08 DIAGNOSIS — G4733 Obstructive sleep apnea (adult) (pediatric): Secondary | ICD-10-CM | POA: Diagnosis not present

## 2022-10-08 DIAGNOSIS — E039 Hypothyroidism, unspecified: Secondary | ICD-10-CM | POA: Diagnosis not present

## 2022-10-08 DIAGNOSIS — Z9049 Acquired absence of other specified parts of digestive tract: Secondary | ICD-10-CM | POA: Diagnosis not present

## 2022-10-08 DIAGNOSIS — Z833 Family history of diabetes mellitus: Secondary | ICD-10-CM | POA: Diagnosis not present

## 2022-10-08 DIAGNOSIS — E663 Overweight: Secondary | ICD-10-CM | POA: Diagnosis not present

## 2022-10-08 DIAGNOSIS — K219 Gastro-esophageal reflux disease without esophagitis: Secondary | ICD-10-CM | POA: Diagnosis not present

## 2022-10-08 DIAGNOSIS — Z6828 Body mass index (BMI) 28.0-28.9, adult: Secondary | ICD-10-CM | POA: Diagnosis not present

## 2022-10-08 DIAGNOSIS — N398 Other specified disorders of urinary system: Secondary | ICD-10-CM | POA: Diagnosis not present

## 2022-10-08 DIAGNOSIS — Z87891 Personal history of nicotine dependence: Secondary | ICD-10-CM | POA: Diagnosis not present

## 2022-10-08 DIAGNOSIS — J4520 Mild intermittent asthma, uncomplicated: Secondary | ICD-10-CM | POA: Diagnosis not present

## 2022-10-08 NOTE — Telephone Encounter (Signed)
This message is over a month old, with no call back from the patient with any further complaints. Closing this encounter.

## 2022-10-08 NOTE — Telephone Encounter (Signed)
Does this patient need EKG still, if so do we need to schedule a nurse visit??

## 2022-10-14 ENCOUNTER — Ambulatory Visit: Payer: Medicare Other | Admitting: Internal Medicine

## 2022-10-24 ENCOUNTER — Ambulatory Visit: Payer: Medicare Other | Admitting: Internal Medicine

## 2022-10-29 ENCOUNTER — Ambulatory Visit: Payer: Medicare Other | Admitting: Internal Medicine

## 2022-11-01 ENCOUNTER — Other Ambulatory Visit: Payer: Self-pay | Admitting: Cardiovascular Disease

## 2022-11-01 DIAGNOSIS — I10 Essential (primary) hypertension: Secondary | ICD-10-CM

## 2022-11-04 ENCOUNTER — Ambulatory Visit: Payer: Medicare Other | Admitting: Internal Medicine

## 2022-11-14 ENCOUNTER — Ambulatory Visit: Payer: Medicare Other | Admitting: Primary Care

## 2022-11-18 ENCOUNTER — Other Ambulatory Visit: Payer: Self-pay | Admitting: Cardiovascular Disease

## 2022-11-18 DIAGNOSIS — I10 Essential (primary) hypertension: Secondary | ICD-10-CM

## 2022-11-19 ENCOUNTER — Ambulatory Visit (INDEPENDENT_AMBULATORY_CARE_PROVIDER_SITE_OTHER): Payer: Medicare Other | Admitting: Internal Medicine

## 2022-11-19 ENCOUNTER — Encounter: Payer: Self-pay | Admitting: Internal Medicine

## 2022-11-19 VITALS — BP 132/60 | HR 87 | Temp 98.1°F | Ht 65.0 in | Wt 172.2 lb

## 2022-11-19 DIAGNOSIS — N993 Prolapse of vaginal vault after hysterectomy: Secondary | ICD-10-CM | POA: Diagnosis not present

## 2022-11-19 DIAGNOSIS — D649 Anemia, unspecified: Secondary | ICD-10-CM | POA: Diagnosis not present

## 2022-11-19 DIAGNOSIS — E119 Type 2 diabetes mellitus without complications: Secondary | ICD-10-CM

## 2022-11-19 DIAGNOSIS — D692 Other nonthrombocytopenic purpura: Secondary | ICD-10-CM

## 2022-11-19 DIAGNOSIS — M65331 Trigger finger, right middle finger: Secondary | ICD-10-CM

## 2022-11-19 DIAGNOSIS — S46212A Strain of muscle, fascia and tendon of other parts of biceps, left arm, initial encounter: Secondary | ICD-10-CM

## 2022-11-19 DIAGNOSIS — I10 Essential (primary) hypertension: Secondary | ICD-10-CM

## 2022-11-19 DIAGNOSIS — E785 Hyperlipidemia, unspecified: Secondary | ICD-10-CM

## 2022-11-19 DIAGNOSIS — I480 Paroxysmal atrial fibrillation: Secondary | ICD-10-CM | POA: Diagnosis not present

## 2022-11-19 DIAGNOSIS — B351 Tinea unguium: Secondary | ICD-10-CM | POA: Diagnosis not present

## 2022-11-19 DIAGNOSIS — E1169 Type 2 diabetes mellitus with other specified complication: Secondary | ICD-10-CM | POA: Diagnosis not present

## 2022-11-19 MED ORDER — ONETOUCH DELICA LANCETS 33G MISC: 5 refills | Status: DC

## 2022-11-19 MED ORDER — ATORVASTATIN CALCIUM 40 MG PO TABS: 40.0000 mg | ORAL_TABLET | Freq: Every day | ORAL | 3 refills | Status: DC

## 2022-11-19 NOTE — Patient Instructions (Addendum)
Switch to benefiber  IF your  2 hour post prandials after metamucil are > 160   Drink either one 2 HOURS APART FROM MEDICATIONS  OK TO ADD COLACE AT NIGHT ON THE NIGHTS YOU USE BENEFIBER OR METAMUCIL  REFERRAL TO [facility] FOR THE  BICEP AND FIMGER ISSUES

## 2022-11-19 NOTE — Progress Notes (Unsigned)
Subjective:  Patient ID: Angela Mendoza, female    DOB: [birth year]  Age: 72 y.o. MRN: [id number]  CC: The primary encounter diagnosis was Essential hypertension, benign. 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   HPI SAVHANNA SLIVA presents for  Chief Complaint  Patient presents with   Medical Management of Chronic Issues    3 month follow up on diabetes   1) Type 2 DM diet controlled with obesity and hypertension:   down 25 lbs  over the past year. [name]  Has been unable to cook,  unable to take her supplements ut  2) colonoscopy by [facility] in Sept   3) bladder surgery in nov.  Managing constipation with miralax alt with metamucil.  Moving bowels daily   4) left biceps pain . Left forearm pain for over 8 week after lifting a box during a residential move.    5)  right middle finger locking in flexed position , improves with heat.   Outpatient Medications Prior to Visit  Medication Sig Dispense Refill   albuterol (PROVENTIL HFA) 108 (90 Base) MCG/ACT inhaler Inhale 2 puffs into the lungs every 6 (six) hours as needed. 18 g 2   ALPRAZolam (XANAX) 0.25 MG tablet TAKE 1 TABLET (0.25 MG TOTAL) BY MOUTH ONCE DAILY AS NEEDED FOR SLEEP. 30 tablet 5   azelastine (ASTELIN) 0.1 % nasal spray Place 2 sprays into both nostrils 2 (two) times daily. Use in each nostril as directed 90 mL 3   benzonatate (TESSALON) 200 MG capsule Take 1 capsule (200 mg total) by mouth 3 (three) times daily as needed for cough. 30 capsule 1   Blood Glucose Calibration (ONETOUCH VERIO) High SOLN Used to check accuracy of blood  glucose machine. 1 each 0   chlorpheniramine (CHLORPHEN) 4 MG tablet Take 1 tablet (4 mg total) by mouth every 4 (four) hours as needed for allergies. 45 tablet 0   Cholecalciferol (VITAMIN D3) 50 MCG (2000 UT) TABS Take 1 tablet by mouth daily. 90 tablet 3   Coenzyme Q10 (COQ10 PO) Take by mouth daily.     conjugated estrogens (PREMARIN) vaginal cream Place vaginally 2 (two) times a week. INSERT 0.5 APPLICATORS FULL VAGINALLY TWICE PER WEEK. AT BEDTIME. 30 g 4   cyanocobalamin (VITAMIN B12) 1000 MCG/ML injection Inject 1 mL (1,000 mcg total) into the muscle every 30 (thirty) days. 3 mL 3   cycloSPORINE (RESTASIS) 0.05 % ophthalmic emulsion Place 1 drop into both eyes 2 (two) times daily. 3 each 3   desonide (DESOWEN) 0.05 % cream Apply topically.     Docusate Calcium (STOOL SOFTENER PO) Take 1 tablet by mouth as needed.     EPINEPHrine 0.3 mg/0.3 mL IJ SOAJ injection 0.3 mg by injection route. 1 each 1   estradiol (VIVELLE-DOT) 0.1 MG/24HR patch Place 1 patch (0.1 mg total) onto the skin 2 (two) times a week. 24 patch 3   famotidine (PEPCID) 20 MG tablet Take 1 tablet (20 mg total) by mouth daily as needed for heartburn or indigestion. 90 tablet 3   fexofenadine (ALLEGRA ALLERGY) 180 MG tablet Take 1 tablet (180 mg total) by mouth daily. 90 tablet 3   fluticasone (  FLONASE) 50 MCG/ACT nasal spray Place 1 spray into both nostrils daily. 48 g 3   glucose blood (ONETOUCH VERIO) test strip Used to check blood sugars 3 times daily. 300 each 3   ipratropium (ATROVENT) 0.03 % nasal spray Place 2 sprays into both nostrils 3 (three) times daily as needed for rhinitis. 90 mL 3   levothyroxine (SYNTHROID) 50 MCG tablet Take 1 tablet (50 mcg total) by mouth daily before breakfast. 90 tablet 3   losartan (COZAAR) 50 MG tablet TAKE 1 TABLET BY MOUTH EVERYDAY AT BEDTIME 30 tablet 4   MAGNESIUM PO Take 250 mg by mouth daily.     meclizine (ANTIVERT) 25 MG tablet Take 1 tablet (25 mg total) by mouth 3 (three)  times daily as needed (vertigo). 84 tablet 3   metoprolol succinate (TOPROL-XL) 50 MG 24 hr tablet TAKE 1 TABLET BY MOUTH EVERY DAY 30 tablet 4   montelukast (SINGULAIR) 10 MG tablet Take 1 tablet (10 mg total) by mouth at bedtime. 90 tablet 3   Multiple Vitamin (MULTIVITAMIN) tablet Take 1 tablet by mouth daily.     mupirocin ointment (BACTROBAN) 2 % Apply topically.     ondansetron (ZOFRAN ODT) 4 MG disintegrating tablet Take 1 tablet (4 mg total) by mouth every 8 (eight) hours as needed for nausea or vomiting. 30 tablet 2   Propylene Glycol 0.6 % SOLN Apply 1 drop to eye as needed.     psyllium (METAMUCIL) 58.6 % powder Take 1 packet by mouth as needed.     Syringe/Needle, Disp, (SYRINGE 3CC/20GX1") 20G X 1" 3 ML MISC 1 Syringe by Does not apply route every 30 (thirty) days. For use with b-12 injection monthly 50 each 0   Tiotropium Bromide Monohydrate (SPIRIVA RESPIMAT) 1.25 MCG/ACT AERS Inhale 2 puffs into the lungs daily. 12 g 3   tretinoin (RETIN-A) 0.05 % cream Apply topically at bedtime. 45 g 1   urea (CARMOL) 40 % CREA Apply topically.     atorvastatin (LIPITOR) 40 MG tablet Take 1 tablet (40 mg total) by mouth daily. 90 tablet 3   OneTouch Delica Lancets 91P MISC Used to check blood sugars twice daly. 100 each 5   magic mouthwash w/lidocaine SOLN Take 5 mLs by mouth 3 (three) times daily as needed for mouth pain. 150 mL 1   No facility-administered medications prior to visit.    Review of Systems;  Patient denies headache, fevers, malaise, unintentional weight loss, skin rash, eye pain, sinus congestion and sinus pain, sore throat, dysphagia,  hemoptysis , cough, dyspnea, wheezing, chest pain, palpitations, orthopnea, edema, abdominal pain, nausea, melena, diarrhea, constipation, flank pain, dysuria, hematuria, urinary  Frequency, nocturia, numbness, tingling, seizures,  Focal weakness, Loss of consciousness,  Tremor, insomnia, depression, anxiety, and suicidal ideation.       Objective:  BP 132/60   Pulse 87   Temp 98.1 F (36.7 C) (Oral)   Ht '5\' 5"'$  (1.651 m)   Wt 172 lb 3.2 oz (78.1 kg)   SpO2 96%   BMI 28.66 kg/m   BP Readings from Last 3 Encounters:  11/19/22 132/60  08/22/22 130/78  08/15/22 126/70    Wt Readings from Last 3 Encounters:  11/19/22 172 lb 3.2 oz (78.1 kg)  08/22/22 181 lb (82.1 kg)  08/15/22 181 lb (82.1 kg)    Physical Exam  Lab Results  Component Value Date   HGBA1C 6.3 11/19/2022   HGBA1C 5.9 (H) 05/30/2022   HGBA1C 6.5 05/28/2022  Lab Results  Component Value Date   CREATININE 0.82 11/19/2022   CREATININE 0.80 07/18/2022   CREATININE 0.97 05/30/2022    Lab Results  Component Value Date   WBC 7.1 11/19/2022   HGB 13.0 11/19/2022   HCT 38.1 11/19/2022   PLT 300.0 11/19/2022   GLUCOSE 99 11/19/2022   CHOL 170 11/19/2022   TRIG 225.0 (H) 11/19/2022   HDL 46.90 11/19/2022   LDLDIRECT 85.0 11/19/2022   LDLCALC 66 05/30/2022   ALT 21 11/19/2022   AST 21 11/19/2022   NA 137 11/19/2022   K 4.5 11/19/2022   CL 99 11/19/2022   CREATININE 0.82 11/19/2022   BUN 12 11/19/2022   CO2 28 11/19/2022   TSH 2.44 05/28/2022   INR 0.9 02/27/2022   HGBA1C 6.3 11/19/2022   MICROALBUR <0.2 05/30/2022    CT Angio Chest W/Cm &/Or Wo Cm  Result Date: 07/18/2022 CLINICAL DATA:  Chest pain.  Shortness of breath. EXAM: CT ANGIOGRAPHY CHEST WITH CONTRAST TECHNIQUE: Multidetector CT imaging of the chest was performed using the standard protocol during bolus administration of intravenous contrast. Multiplanar CT image reconstructions and MIPs were obtained to evaluate the vascular anatomy. RADIATION DOSE REDUCTION: This exam was performed according to the departmental dose-optimization program which includes automated exposure control, adjustment of the mA and/or kV according to patient size and/or use of iterative reconstruction technique. CONTRAST:  62m OMNIPAQUE IOHEXOL 350 MG/ML SOLN COMPARISON:  Two-view chest x-ray  06/10/2022.  CT heart 04/04/2021 FINDINGS: Cardiovascular: Heart size is normal. Aorta and great vessel origins are within normal limits. The descending thoracic aorta is unremarkable. Pulmonary artery opacification is excellent. No focal filling defects are present to suggest pulmonary embolus. Pulmonary artery size is within normal limits. Mediastinum/Nodes: No enlarged mediastinal, hilar, or axillary lymph nodes. Thyroid gland, trachea, and esophagus demonstrate no significant findings. Lungs/Pleura: Mild dependent atelectasis is present. No nodule or mass lesion is present. No significant pleural disease is present. Upper Abdomen: Cholecystectomy noted. Limited imaging the upper abdomen is otherwise unremarkable. Musculoskeletal: No chest wall abnormality. No acute or significant osseous findings. Review of the MIP images confirms the above findings. IMPRESSION: 1. No pulmonary embolus. 2. No acute or focal lesion to explain the patient's chest pain. 3. Cholecystectomy. Electronically Signed   By: CSan MorelleM.D.   On: 07/18/2022 14:45   UKoreaVenous Img Lower Bilateral  Result Date: 07/18/2022 CLINICAL DATA:  72year old female with bilateral lower extremity swelling. EXAM: BILATERAL LOWER EXTREMITY VENOUS DOPPLER ULTRASOUND TECHNIQUE: Gray-scale sonography with graded compression, as well as color Doppler and duplex ultrasound were performed to evaluate the lower extremity deep venous systems from the level of the common femoral vein and including the common femoral, femoral, profunda femoral, popliteal and calf veins including the posterior tibial, peroneal and gastrocnemius veins when visible. The superficial great saphenous vein was also interrogated. Spectral Doppler was utilized to evaluate flow at rest and with distal augmentation maneuvers in the common femoral, femoral and popliteal veins. COMPARISON:  05/14/2017 FINDINGS: RIGHT LOWER EXTREMITY Common Femoral Vein: No evidence of thrombus.  Normal compressibility, respiratory phasicity and response to augmentation. Saphenofemoral Junction: No evidence of thrombus. Normal compressibility and flow on color Doppler imaging. Profunda Femoral Vein: No evidence of thrombus. Normal compressibility and flow on color Doppler imaging. Femoral Vein: No evidence of thrombus. Normal compressibility, respiratory phasicity and response to augmentation. Popliteal Vein: No evidence of thrombus. Normal compressibility, respiratory phasicity and response to augmentation. Calf Veins: No evidence of thrombus. Normal compressibility  and flow on color Doppler imaging. Other Findings:  None. LEFT LOWER EXTREMITY Common Femoral Vein: No evidence of thrombus. Normal compressibility, respiratory phasicity and response to augmentation. Saphenofemoral Junction: No evidence of thrombus. Normal compressibility and flow on color Doppler imaging. Profunda Femoral Vein: No evidence of thrombus. Normal compressibility and flow on color Doppler imaging. Femoral Vein: No evidence of thrombus. Normal compressibility, respiratory phasicity and response to augmentation. Popliteal Vein: No evidence of thrombus. Normal compressibility, respiratory phasicity and response to augmentation. Calf Veins: No evidence of thrombus. Normal compressibility and flow on color Doppler imaging. Other Findings:  None. IMPRESSION: No evidence of bilateral lower extremity deep venous thrombosis. Ruthann Cancer, MD [facility] Radiology Electronically Signed   By: Ruthann Cancer M.D.   On: 07/18/2022 14:04    Assessment & Plan:  .Essential hypertension, benign -     Comprehensive metabolic panel  Controlled type 2 diabetes mellitus without complication, without long-term current use of insulin (HCC) -     Hemoglobin A1c -     Comprehensive metabolic panel  Hyperlipidemia associated with type 2 diabetes mellitus (HCC) -     Lipid panel -     LDL  cholesterol, direct  Anemia, unspecified type -     CBC with Differential/Platelet -     Iron, TIBC and Ferritin Panel -     B12 and Folate Panel  Trigger middle finger of right hand -     Ambulatory referral to Orthopedic Surgery  Strain of left biceps, initial encounter -     Ambulatory referral to Orthopedic Surgery  Purpura senilis [facility]) Assessment & Plan: She has purpura on both arms.  She uses  fish oil and asa.   Lab Results  Component Value Date   WBC 7.1 11/19/2022   HGB 13.0 11/19/2022   HCT 38.1 11/19/2022   MCV 91.9 11/19/2022   PLT 300.0 11/19/2022      Paroxysmal atrial fibrillation (HCC) Assessment & Plan: Previously reported symptomatic bradycardia  has resolved without reduction of  metoprolol dose to 25 mg daily   Vaginal vault prolapse after hysterectomy  Onychomycosis Assessment & Plan: Encourged to use the pulsed fluconazole 150 mg weekly    Other orders -     Atorvastatin Calcium; Take 1 tablet (40 mg total) by mouth daily.  Dispense: 90 tablet; Refill: 3 -     OneTouch Delica Lancets 62G; Used to check blood sugars twice daly.  Dispense: 100 each; Refill: 5     I provided 30 minutes of face-to-face time during this encounter reviewing patient's last visit with me, patient's  most recent visit with cardiology,  nephrology,  and neurology,  recent surgical and non surgical procedures, previous  labs and imaging studies, counseling on currently addressed issues,  and post visit ordering to diagnostics and therapeutics .   Follow-up: Return in about 6 months (around 05/20/2023) for follow up diabetes, physical.   Crecencio Mc, MD

## 2022-11-20 DIAGNOSIS — B351 Tinea unguium: Secondary | ICD-10-CM | POA: Insufficient documentation

## 2022-11-20 LAB — HEMOGLOBIN A1C: Hgb A1c MFr Bld: 6.3 % (ref 4.6–6.5)

## 2022-11-20 LAB — COMPREHENSIVE METABOLIC PANEL
ALT: 21 U/L (ref 0–35)
AST: 21 U/L (ref 0–37)
Albumin: 4.5 g/dL (ref 3.5–5.2)
Alkaline Phosphatase: 76 U/L (ref 39–117)
BUN: 12 mg/dL (ref 6–23)
CO2: 28 mEq/L (ref 19–32)
Calcium: 9.7 mg/dL (ref 8.4–10.5)
Chloride: 99 mEq/L (ref 96–112)
Creatinine, Ser: 0.82 mg/dL (ref 0.40–1.20)
GFR: 71.66 mL/min (ref >60.00)
Glucose, Bld: 99 mg/dL (ref 70–99)
Potassium: 4.5 mEq/L (ref 3.5–5.1)
Sodium: 137 mEq/L (ref 135–145)
Total Bilirubin: 0.9 mg/dL (ref 0.2–1.2)
Total Protein: 7.4 g/dL (ref 6.0–8.3)

## 2022-11-20 LAB — CBC WITH DIFFERENTIAL/PLATELET
Basophils Absolute: 0.1 10*3/uL (ref 0.0–0.1)
Basophils Relative: 1.4 % (ref 0.0–3.0)
Eosinophils Absolute: 0.1 10*3/uL (ref 0.0–0.7)
Eosinophils Relative: 1.1 % (ref 0.0–5.0)
HCT: 38.1 % (ref 36.0–46.0)
Hemoglobin: 13 g/dL (ref 12.0–15.0)
Lymphocytes Relative: 35.4 % (ref 12.0–46.0)
Lymphs Abs: 2.5 10*3/uL (ref 0.7–4.0)
MCHC: 34.1 g/dL (ref 30.0–36.0)
MCV: 91.9 fl (ref 78.0–100.0)
Monocytes Absolute: 0.7 10*3/uL (ref 0.1–1.0)
Monocytes Relative: 10.3 % (ref 3.0–12.0)
Neutro Abs: 3.7 10*3/uL (ref 1.4–7.7)
Neutrophils Relative %: 51.8 % (ref 43.0–77.0)
Platelets: 300 10*3/uL (ref 150.0–400.0)
RBC: 4.15 Mil/uL (ref 3.87–5.11)
RDW: 13.3 % (ref 11.5–15.5)
WBC: 7.1 10*3/uL (ref 4.0–10.5)

## 2022-11-20 LAB — LIPID PANEL
Cholesterol: 170 mg/dL (ref 0–200)
HDL: 46.9 mg/dL (ref >39.00)
NonHDL: 122.83
Total CHOL/HDL Ratio: 4
Triglycerides: 225 mg/dL — ABNORMAL HIGH (ref 0.0–149.0)
VLDL: 45 mg/dL — ABNORMAL HIGH (ref 0.0–40.0)

## 2022-11-20 LAB — IRON,TIBC AND FERRITIN PANEL
%SAT: 31 % (calc) (ref 16–45)
Ferritin: 77 ng/mL (ref 16–288)
Iron: 114 ug/dL (ref 45–160)
TIBC: 370 mcg/dL (calc) (ref 250–450)

## 2022-11-20 LAB — B12 AND FOLATE PANEL
Folate: 19.8 ng/mL (ref >5.9)
Vitamin B-12: 321 pg/mL (ref 211–911)

## 2022-11-20 LAB — LDL CHOLESTEROL, DIRECT: Direct LDL: 85 mg/dL

## 2022-11-20 NOTE — Assessment & Plan Note (Signed)
Encourged to use the pulsed fluconazole 150 mg weekly

## 2022-11-20 NOTE — Assessment & Plan Note (Signed)
She has purpura on both arms.  She uses  fish oil and asa.   Lab Results  Component Value Date   WBC 7.1 11/19/2022   HGB 13.0 11/19/2022   HCT 38.1 11/19/2022   MCV 91.9 11/19/2022   PLT 300.0 11/19/2022

## 2022-11-20 NOTE — Assessment & Plan Note (Addendum)
Previously reported symptomatic bradycardia  has resolved without reduction of  metoprolol dose to 25 mg daily

## 2022-11-24 DIAGNOSIS — M65341 Trigger finger, right ring finger: Secondary | ICD-10-CM | POA: Diagnosis not present

## 2022-11-24 DIAGNOSIS — M180 Bilateral primary osteoarthritis of first carpometacarpal joints: Secondary | ICD-10-CM | POA: Diagnosis not present

## 2022-11-24 DIAGNOSIS — M7542 Impingement syndrome of left shoulder: Secondary | ICD-10-CM | POA: Diagnosis not present

## 2022-12-01 ENCOUNTER — Ambulatory Visit: Payer: Medicare Other | Admitting: Primary Care

## 2022-12-04 DIAGNOSIS — M65331 Trigger finger, right middle finger: Secondary | ICD-10-CM | POA: Diagnosis not present

## 2022-12-04 DIAGNOSIS — M65341 Trigger finger, right ring finger: Secondary | ICD-10-CM | POA: Diagnosis not present

## 2023-01-02 ENCOUNTER — Ambulatory Visit: Payer: Medicare Other | Admitting: Internal Medicine

## 2023-01-06 ENCOUNTER — Encounter: Payer: Self-pay | Admitting: Internal Medicine

## 2023-01-06 ENCOUNTER — Ambulatory Visit (INDEPENDENT_AMBULATORY_CARE_PROVIDER_SITE_OTHER): Payer: Medicare Other | Admitting: Internal Medicine

## 2023-01-06 VITALS — BP 126/80 | HR 77 | Temp 98.1°F | Ht 65.0 in | Wt 178.2 lb

## 2023-01-06 DIAGNOSIS — I10 Essential (primary) hypertension: Secondary | ICD-10-CM

## 2023-01-06 DIAGNOSIS — I480 Paroxysmal atrial fibrillation: Secondary | ICD-10-CM

## 2023-01-06 DIAGNOSIS — Z1231 Encounter for screening mammogram for malignant neoplasm of breast: Secondary | ICD-10-CM

## 2023-01-06 DIAGNOSIS — E119 Type 2 diabetes mellitus without complications: Secondary | ICD-10-CM | POA: Diagnosis not present

## 2023-01-06 DIAGNOSIS — E785 Hyperlipidemia, unspecified: Secondary | ICD-10-CM

## 2023-01-06 DIAGNOSIS — K644 Residual hemorrhoidal skin tags: Secondary | ICD-10-CM

## 2023-01-06 DIAGNOSIS — F411 Generalized anxiety disorder: Secondary | ICD-10-CM

## 2023-01-06 DIAGNOSIS — I6789 Other cerebrovascular disease: Secondary | ICD-10-CM | POA: Diagnosis not present

## 2023-01-06 DIAGNOSIS — E034 Atrophy of thyroid (acquired): Secondary | ICD-10-CM | POA: Diagnosis not present

## 2023-01-06 DIAGNOSIS — E1169 Type 2 diabetes mellitus with other specified complication: Secondary | ICD-10-CM

## 2023-01-06 DIAGNOSIS — G4733 Obstructive sleep apnea (adult) (pediatric): Secondary | ICD-10-CM | POA: Diagnosis not present

## 2023-01-06 DIAGNOSIS — J45909 Unspecified asthma, uncomplicated: Secondary | ICD-10-CM

## 2023-01-06 MED ORDER — HYDROCORT-PRAMOXINE (PERIANAL) 1-1 % EX FOAM: 1.0000 | Freq: Two times a day (BID) | CUTANEOUS | 1 refills | Status: DC

## 2023-01-06 MED ORDER — DIAZEPAM 5 MG PO TABS: 5.0000 mg | ORAL_TABLET | Freq: Four times a day (QID) | ORAL | 0 refills | Status: DC | PRN

## 2023-01-06 NOTE — Progress Notes (Signed)
Subjective:  Patient ID: Angela Mendoza, female    DOB: May 14, 1951  Age: 72 y.o. MRN: [id number]  CC: The primary encounter diagnosis was Encounter for screening mammogram for malignant neoplasm of breast. Diagnoses of Essential hypertension, benign, Hypothyroidism due to acquired atrophy of thyroid, Controlled type 2 diabetes mellitus without complication, without long-term current use of insulin ([place]), Hyperlipidemia associated with type 2 diabetes mellitus ([place]), Bleeding external hemorrhoids, Asthma, intrinsic, Cerebral microvascular disease, Paroxysmal atrial fibrillation ([place]), OSA on CPAP, and GAD (generalized anxiety disorder) were also pertinent to this visit.   HPI Angela Mendoza presents for follow up on multiple issues  Chief Complaint  Patient presents with   Medical Management of Chronic Issues    6 month follow up on diabetes    1) Type 2 DM:  diet controlled  .  Has gained some weight due to stress eating.     2) Stress:  finally improved with sale of house this weekend,  multiple medical procedures now completed (surgery,  colonoscopy)  Planning to rent an apartment  until long term decisions about returning  to [place] (in a house with a pool that  her sister in law owns) etc are made . Son getting married next month   3) Hemorrhoids, irritated and bleeding,  stared up after her since her bladder sling surgery   Irritated,  now bleeding whenever she wipes and having trouble staying clean.  Was unable to get an appt with [facility] ,  who is leaving the practice.  Was given appt in 3 weeks with her PA .  She requested Lyndel Safe  who will see her in 6 weeks   4)  asthma:  no recent exacerbation ;  currently  using spiriva and singulair . Bruising less snce off steroids .     Outpatient Medications Prior to Visit  Medication Sig Dispense Refill   albuterol (PROVENTIL HFA) 108 (90 Base) MCG/ACT inhaler Inhale 2 puffs into the lungs every 6 (six) hours as needed. 18 g 2    ALPRAZolam (XANAX) 0.25 MG tablet TAKE 1 TABLET (0.25 MG TOTAL) BY MOUTH ONCE DAILY AS NEEDED FOR SLEEP. 30 tablet 5   atorvastatin (LIPITOR) 40 MG tablet Take 1 tablet (40 mg total) by mouth daily. 90 tablet 3   azelastine (ASTELIN) 0.1 % nasal spray Place 2 sprays into both nostrils 2 (two) times daily. Use in each nostril as directed 90 mL 3   benzonatate (TESSALON) 200 MG capsule Take 1 capsule (200 mg total) by mouth 3 (three) times daily as needed for cough. 30 capsule 1   Blood Glucose Calibration (ONETOUCH VERIO) High SOLN Used to check accuracy of blood glucose machine. 1 each 0   chlorpheniramine (CHLORPHEN) 4 MG tablet Take 1 tablet (4 mg total) by mouth every 4 (four) hours as needed for allergies. 45 tablet 0   Cholecalciferol (VITAMIN D3) 50 MCG (2000 UT) TABS Take 1 tablet by mouth daily. 90 tablet 3   Coenzyme Q10 (COQ10 PO) Take by mouth daily.     conjugated estrogens (PREMARIN) vaginal cream Place vaginally 2 (two) times a week. INSERT 0.5 APPLICATORS FULL VAGINALLY TWICE PER WEEK. AT BEDTIME. 30 g 4   cyanocobalamin (VITAMIN B12) 1000 MCG/ML injection Inject 1 mL (1,000 mcg total) into the muscle every 30 (thirty) days. 3 mL 3   cycloSPORINE (RESTASIS) 0.05 % ophthalmic emulsion Place 1 drop into both eyes 2 (two) times daily. 3 each 3   desonide (DESOWEN) 0.05 %  cream Apply topically.     diazepam (VALIUM) 5 MG tablet Take 5 mg by mouth every 6 (six) hours as needed for anxiety.     Docusate Calcium (STOOL SOFTENER PO) Take 1 tablet by mouth as needed.     EPINEPHrine 0.3 mg/0.3 mL IJ SOAJ injection 0.3 mg by injection route. 1 each 1   estradiol (VIVELLE-DOT) 0.1 MG/24HR patch Place 1 patch (0.1 mg total) onto the skin 2 (two) times a week. 24 patch 3   famotidine (PEPCID) 20 MG tablet Take 1 tablet (20 mg total) by mouth daily as needed for heartburn or indigestion. 90 tablet 3   fexofenadine (ALLEGRA ALLERGY) 180 MG tablet Take 1 tablet (180 mg total) by mouth daily. 90  tablet 3   fluticasone (FLONASE) 50 MCG/ACT nasal spray Place 1 spray into both nostrils daily. 48 g 3   glucose blood (ONETOUCH VERIO) test strip Used to check blood sugars 3 times daily. 300 each 3   ipratropium (ATROVENT) 0.03 % nasal spray Place 2 sprays into both nostrils 3 (three) times daily as needed for rhinitis. 90 mL 3   levothyroxine (SYNTHROID) 50 MCG tablet Take 1 tablet (50 mcg total) by mouth daily before breakfast. 90 tablet 3   losartan (COZAAR) 50 MG tablet TAKE 1 TABLET BY MOUTH EVERYDAY AT BEDTIME 30 tablet 4   MAGNESIUM PO Take 250 mg by mouth daily.     meclizine (ANTIVERT) 25 MG tablet Take 1 tablet (25 mg total) by mouth 3 (three) times daily as needed (vertigo). 84 tablet 3   meloxicam (MOBIC) 15 MG tablet Take 15 mg by mouth daily.     metoprolol succinate (TOPROL-XL) 50 MG 24 hr tablet TAKE 1 TABLET BY MOUTH EVERY DAY 30 tablet 4   montelukast (SINGULAIR) 10 MG tablet Take 1 tablet (10 mg total) by mouth at bedtime. 90 tablet 3   Multiple Vitamin (MULTIVITAMIN) tablet Take 1 tablet by mouth daily.     mupirocin ointment (BACTROBAN) 2 % Apply topically.     ondansetron (ZOFRAN ODT) 4 MG disintegrating tablet Take 1 tablet (4 mg total) by mouth every 8 (eight) hours as needed for nausea or vomiting. 30 tablet 2   OneTouch Delica Lancets 99991111 MISC Used to check blood sugars twice daly. 100 each 5   Propylene Glycol 0.6 % SOLN Apply 1 drop to eye as needed.     psyllium (METAMUCIL) 58.6 % powder Take 1 packet by mouth as needed.     Syringe/Needle, Disp, (SYRINGE 3CC/20GX1") 20G X 1" 3 ML MISC 1 Syringe by Does not apply route every 30 (thirty) days. For use with b-12 injection monthly 50 each 0   Tiotropium Bromide Monohydrate (SPIRIVA RESPIMAT) 1.25 MCG/ACT AERS Inhale 2 puffs into the lungs daily. 12 g 3   tretinoin (RETIN-A) 0.05 % cream Apply topically at bedtime. 45 g 1   urea (CARMOL) 40 % CREA Apply topically.     No facility-administered medications prior to  visit.    Review of Systems;  Patient denies headache, fevers, malaise, unintentional weight loss, skin rash, eye pain, sinus congestion and sinus pain, sore throat, dysphagia,  hemoptysis , cough, dyspnea, wheezing, chest pain, palpitations, orthopnea, edema, abdominal pain, nausea, melena, diarrhea, constipation, flank pain, dysuria, hematuria, urinary  Frequency, nocturia, numbness, tingling, seizures,  Focal weakness, Loss of consciousness,  Tremor, insomnia, depression, anxiety, and suicidal ideation.      Objective:  BP 126/80   Pulse 77   Temp 98.1 F (  36.7 C) (Oral)   Ht '5\' 5"'$  (1.651 m)   Wt 178 lb 3.2 oz (80.8 kg)   SpO2 96%   BMI 29.65 kg/m   BP Readings from Last 3 Encounters:  01/06/23 126/80  11/19/22 132/60  08/22/22 130/78    Wt Readings from Last 3 Encounters:  01/06/23 178 lb 3.2 oz (80.8 kg)  11/19/22 172 lb 3.2 oz (78.1 kg)  08/22/22 181 lb (82.1 kg)    Physical Exam Vitals reviewed.  Constitutional:      General: She is not in acute distress.    Appearance: Normal appearance. She is normal weight. She is not ill-appearing, toxic-appearing or diaphoretic.  HENT:     Head: Normocephalic.  Eyes:     General: No scleral icterus.       Right eye: No discharge.        Left eye: No discharge.     Conjunctiva/sclera: Conjunctivae normal.  Cardiovascular:     Rate and Rhythm: Normal rate and regular rhythm.     Heart sounds: Normal heart sounds.  Pulmonary:     Effort: Pulmonary effort is normal. No respiratory distress.     Breath sounds: Normal breath sounds.  Musculoskeletal:        General: Normal range of motion.  Skin:    General: Skin is warm and dry.  Neurological:     General: No focal deficit present.     Mental Status: She is alert and oriented to person, place, and time. Mental status is at baseline.  Psychiatric:        Mood and Affect: Mood normal.        Behavior: Behavior normal.        Thought Content: Thought content normal.         Judgment: Judgment normal.     Lab Results  Component Value Date   HGBA1C 6.3 11/19/2022   HGBA1C 5.9 (H) 05/30/2022   HGBA1C 6.5 05/28/2022    Lab Results  Component Value Date   CREATININE 0.82 11/19/2022   CREATININE 0.80 07/18/2022   CREATININE 0.97 05/30/2022    Lab Results  Component Value Date   WBC 7.1 11/19/2022   HGB 13.0 11/19/2022   HCT 38.1 11/19/2022   PLT 300.0 11/19/2022   GLUCOSE 99 11/19/2022   CHOL 170 11/19/2022   TRIG 225.0 (H) 11/19/2022   HDL 46.90 11/19/2022   LDLDIRECT 85.0 11/19/2022   LDLCALC 66 05/30/2022   ALT 21 11/19/2022   AST 21 11/19/2022   NA 137 11/19/2022   K 4.5 11/19/2022   CL 99 11/19/2022   CREATININE 0.82 11/19/2022   BUN 12 11/19/2022   CO2 28 11/19/2022   TSH 2.44 05/28/2022   INR 0.9 02/27/2022   HGBA1C 6.3 11/19/2022   MICROALBUR <0.2 05/30/2022    CT Angio Chest W/Cm &/Or Wo Cm  Result Date: 07/18/2022 CLINICAL DATA:  Chest pain.  Shortness of breath. EXAM: CT ANGIOGRAPHY CHEST WITH CONTRAST TECHNIQUE: Multidetector CT imaging of the chest was performed using the standard protocol during bolus administration of intravenous contrast. Multiplanar CT image reconstructions and MIPs were obtained to evaluate the vascular anatomy. RADIATION DOSE REDUCTION: This exam was performed according to the departmental dose-optimization program which includes automated exposure control, adjustment of the mA and/or kV according to patient size and/or use of iterative reconstruction technique. CONTRAST:  22m OMNIPAQUE IOHEXOL 350 MG/ML SOLN COMPARISON:  Two-view chest x-ray 06/10/2022.  CT heart 04/04/2021 FINDINGS: Cardiovascular: Heart size is normal. Aorta  and great vessel origins are within normal limits. The descending thoracic aorta is unremarkable. Pulmonary artery opacification is excellent. No focal filling defects are present to suggest pulmonary embolus. Pulmonary artery size is within normal limits. Mediastinum/Nodes: No  enlarged mediastinal, hilar, or axillary lymph nodes. Thyroid gland, trachea, and esophagus demonstrate no significant findings. Lungs/Pleura: Mild dependent atelectasis is present. No nodule or mass lesion is present. No significant pleural disease is present. Upper Abdomen: Cholecystectomy noted. Limited imaging the upper abdomen is otherwise unremarkable. Musculoskeletal: No chest wall abnormality. No acute or significant osseous findings. Review of the MIP images confirms the above findings. IMPRESSION: 1. No pulmonary embolus. 2. No acute or focal lesion to explain the patient's chest pain. 3. Cholecystectomy. Electronically Signed   By: San Morelle M.D.   On: 07/18/2022 14:45   US Venous Img Lower Bilateral  Result Date: 07/18/2022 CLINICAL DATA:  72 year old female with bilateral lower extremity swelling. EXAM: BILATERAL LOWER EXTREMITY VENOUS DOPPLER ULTRASOUND TECHNIQUE: Gray-scale sonography with graded compression, as well as color Doppler and duplex ultrasound were performed to evaluate the lower extremity deep venous systems from the level of the common femoral vein and including the common femoral, femoral, profunda femoral, popliteal and calf veins including the posterior tibial, peroneal and gastrocnemius veins when visible. The superficial great saphenous vein was also interrogated. Spectral Doppler was utilized to evaluate flow at rest and with distal augmentation maneuvers in the common femoral, femoral and popliteal veins. COMPARISON:  05/14/2017 FINDINGS: RIGHT LOWER EXTREMITY Common Femoral Vein: No evidence of thrombus. Normal compressibility, respiratory phasicity and response to augmentation. Saphenofemoral Junction: No evidence of thrombus. Normal compressibility and flow on color Doppler imaging. Profunda Femoral Vein: No evidence of thrombus. Normal compressibility and flow on color Doppler imaging. Femoral Vein: No evidence of thrombus. Normal compressibility, respiratory  phasicity and response to augmentation. Popliteal Vein: No evidence of thrombus. Normal compressibility, respiratory phasicity and response to augmentation. Calf Veins: No evidence of thrombus. Normal compressibility and flow on color Doppler imaging. Other Findings:  None. LEFT LOWER EXTREMITY Common Femoral Vein: No evidence of thrombus. Normal compressibility, respiratory phasicity and response to augmentation. Saphenofemoral Junction: No evidence of thrombus. Normal compressibility and flow on color Doppler imaging. Profunda Femoral Vein: No evidence of thrombus. Normal compressibility and flow on color Doppler imaging. Femoral Vein: No evidence of thrombus. Normal compressibility, respiratory phasicity and response to augmentation. Popliteal Vein: No evidence of thrombus. Normal compressibility, respiratory phasicity and response to augmentation. Calf Veins: No evidence of thrombus. Normal compressibility and flow on color Doppler imaging. Other Findings:  None. IMPRESSION: No evidence of bilateral lower extremity deep venous thrombosis. Ruthann Cancer, MD [facility]iology Electronically Signed   By: Ruthann Cancer M.D.   On: 07/18/2022 14:04    Assessment & Plan:  .Encounter for screening mammogram for malignant neoplasm of breast -     3D Screening Mammogram, Left and Right; Future  Essential hypertension, benign -     Comprehensive metabolic panel; Future -     Microalbumin / creatinine urine ratio; Future  Hypothyroidism due to acquired atrophy of thyroid -     TSH; Future  Controlled type 2 diabetes mellitus without complication, without long-term current use of insulin ([place]) Assessment & Plan: Currently well-controlled with  dietary modifications and restriction of simple sugars. Patient is reminded to schedule an annual eye exam in the spring  and foot exam is normal today. Patient has no microalbuminuria. Patient is tolerating statin  therapy for CAD risk reduction and on ACE/ARB for renal protection and hypertension   . Lab Results  Component Value Date   HGBA1C 6.3 11/19/2022   Lab Results  Component Value Date   MICROALBUR <0.2 05/30/2022       Orders: -     Comprehensive metabolic panel; Future -     Hemoglobin A1c; Future -     Microalbumin / creatinine urine ratio; Future  Hyperlipidemia associated with type 2 diabetes mellitus ([place]) Assessment & Plan: Diagnosed with an A1c of 6.5  Advised to consider resuming   statin .  She has no proteinuria.   Lab Results  Component Value Date   HGBA1C 6.3 11/19/2022   Lab Results  Component Value Date   MICROALBUR <0.2 05/30/2022       Orders: -     Lipid panel; Future -     LDL cholesterol, direct; Future  Bleeding external hemorrhoids Assessment & Plan: She declines exam but has an appt with GI PA in 3 weeks.  Trail of proctofoam   Asthma, intrinsic Assessment & Plan: Managed with Spiriva and Singulair.  No recent exacerbations    Cerebral microvascular disease Assessment & Plan: By MRI .  Taking aspirin use to 2-3 times per week due to excessive bruising.  She has had no events.  LDL is < 100  Lab Results  Component Value Date   CHOL 170 11/19/2022   HDL 46.90 11/19/2022   LDLCALC 66 05/30/2022   LDLDIRECT 85.0 11/19/2022   TRIG 225.0 (H) 11/19/2022   CHOLHDL 4 11/19/2022      Paroxysmal atrial fibrillation ([facility]) Assessment & Plan: Previously reported symptomatic bradycardia  has resolved without reduction of  metoprolol dose to 25 mg daily   OSA on CPAP Assessment & Plan:  She is wearing her CPAP every night a minimum of 6 hours per night and notes improved daytime wakefulness and decreased fatigue     GAD (generalized anxiety disorder) Assessment & Plan: Improved with resolution of financial stressors.   Other orders -     Hydrocort-Pramoxine (Perianal); Place 1 applicator rectally 2 (two) times daily.  Dispense: 10  g; Refill: 1 -     diazePAM; Take 1 tablet (5 mg total) by mouth every 6 (six) hours as needed for anxiety.  Dispense: 30 tablet; Refill: 0    .   Follow-up: No follow-ups on file.   Crecencio Mc, MD

## 2023-01-06 NOTE — Assessment & Plan Note (Addendum)
Diagnosed with an A1c of 6.5  Advised to consider resuming   statin .  She has no proteinuria.   Lab Results  Component Value Date   HGBA1C 6.3 11/19/2022   Lab Results  Component Value Date   MICROALBUR <0.2 05/30/2022

## 2023-01-06 NOTE — Assessment & Plan Note (Signed)
By MRI .  Taking aspirin use to 2-3 times per week due to excessive bruising.  She has had no events.  LDL is < 100  Lab Results  Component Value Date   CHOL 170 11/19/2022   HDL 46.90 11/19/2022   LDLCALC 66 05/30/2022   LDLDIRECT 85.0 11/19/2022   TRIG 225.0 (H) 11/19/2022   CHOLHDL 4 11/19/2022

## 2023-01-06 NOTE — Assessment & Plan Note (Signed)
Previously reported symptomatic bradycardia  has resolved without reduction of  metoprolol dose to 25 mg daily 

## 2023-01-06 NOTE — Assessment & Plan Note (Signed)
Currently well-controlled with  dietary modifications and restriction of simple sugars. Patient is reminded to schedule an annual eye exam in the spring  and foot exam is normal today. Patient has no microalbuminuria. Patient is tolerating statin therapy for CAD risk reduction and on ACE/ARB for renal protection and hypertension   . Lab Results  Component Value Date   HGBA1C 6.3 11/19/2022   Lab Results  Component Value Date   MICROALBUR <0.2 05/30/2022

## 2023-01-06 NOTE — Assessment & Plan Note (Signed)
Managed with Spiriva and Singulair.  No recent exacerbations

## 2023-01-06 NOTE — Patient Instructions (Addendum)
Your Medicare Annual Wellness visit is due in August 2024. Please schedule this appointment at checkout.    Proctofoam sent to [facility] for hemorrhoids   Use these prepared foods to stay on track and lower your calories/carbs:  Premier  Protein shakes   Healthy Choice Low carb Power  bowls   Healthy Choice  ZERO added sugar entrees

## 2023-01-06 NOTE — Assessment & Plan Note (Signed)
She declines exam but has an appt with GI PA in 3 weeks.  Trail of proctofoam

## 2023-01-06 NOTE — Assessment & Plan Note (Signed)
She is wearing her CPAP every night a minimum of 6 hours per night and notes improved daytime wakefulness and decreased fatigue  

## 2023-01-06 NOTE — Assessment & Plan Note (Signed)
Improved with resolution of financial stressors.

## 2023-01-13 ENCOUNTER — Telehealth: Payer: Self-pay | Admitting: Internal Medicine

## 2023-01-13 NOTE — Telephone Encounter (Signed)
Placed in quick sign folder.  

## 2023-01-13 NOTE — Telephone Encounter (Signed)
Patient dropped renewal handicapp form. Form is up front in Dr Lupita Dawn color folder.

## 2023-01-14 DIAGNOSIS — I480 Paroxysmal atrial fibrillation: Secondary | ICD-10-CM | POA: Diagnosis not present

## 2023-01-14 DIAGNOSIS — M65331 Trigger finger, right middle finger: Secondary | ICD-10-CM | POA: Diagnosis not present

## 2023-01-15 NOTE — Telephone Encounter (Signed)
LMTCB. Need to let pt know that the handicap placard form has been completed and placed up front for pick up. Placed in the accordion folder.

## 2023-01-19 ENCOUNTER — Encounter: Payer: Self-pay | Admitting: Family

## 2023-01-19 ENCOUNTER — Ambulatory Visit (INDEPENDENT_AMBULATORY_CARE_PROVIDER_SITE_OTHER): Payer: Medicare Other | Admitting: Family

## 2023-01-19 VITALS — BP 128/78 | HR 76 | Temp 98.3°F | Ht 66.0 in | Wt 176.4 lb

## 2023-01-19 DIAGNOSIS — J0100 Acute maxillary sinusitis, unspecified: Secondary | ICD-10-CM

## 2023-01-19 DIAGNOSIS — J329 Chronic sinusitis, unspecified: Secondary | ICD-10-CM | POA: Insufficient documentation

## 2023-01-19 MED ORDER — DOXYCYCLINE HYCLATE 100 MG PO TABS: 100.0000 mg | ORAL_TABLET | Freq: Two times a day (BID) | ORAL | 0 refills | Status: DC

## 2023-01-19 NOTE — Progress Notes (Signed)
Assessment & Plan:  There are no diagnoses linked to this encounter.   Return precautions given.   Risks, benefits, and alternatives of the medications and treatment plan prescribed today were discussed, and patient expressed understanding.   Education regarding symptom management and diagnosis given to patient on AVS either electronically or printed.  No follow-ups on file.  [name], FNP  Subjective:    Patient ID: Angela Mendoza, female    DOB: 1951-05-29, 72 y.o.   MRN: [id number]  CC: Angela Mendoza is a 72 y.o. female who presents today for an acute visit.    HPI: Complains of cough, nasal congestion x 10 days  Thick nasal congestion, with some green and BRB blood.   Endorses sinus pressure, sneezing, ears are popping.   No fever, sob, wheezing.       Compliant with azelastine nasal spray, albuterol, Allegra, Flonase, Atrovent nasal spray, spiriva  history of parosysmal atrial fibrillation, asthma, allergic rhinitis, OSA, GERD, diabetes  No h/o ckd  Allergies: Codeine, Qvar [beclomethasone], and Sulfonamide derivatives Current Outpatient Medications on File Prior to Visit  Medication Sig Dispense Refill   albuterol (PROVENTIL HFA) 108 (90 Base) MCG/ACT inhaler Inhale 2 puffs into the lungs every 6 (six) hours as needed. 18 g 2   ALPRAZolam (XANAX) 0.25 MG tablet TAKE 1 TABLET (0.25 MG TOTAL) BY MOUTH ONCE DAILY AS NEEDED FOR SLEEP. 30 tablet 5   atorvastatin (LIPITOR) 40 MG tablet Take 1 tablet (40 mg total) by mouth daily. 90 tablet 3   azelastine (ASTELIN) 0.1 % nasal spray Place 2 sprays into both nostrils 2 (two) times daily. Use in each nostril as directed 90 mL 3   benzonatate (TESSALON) 200 MG capsule Take 1 capsule (200 mg total) by mouth 3 (three) times daily as needed for cough. 30 capsule 1   Blood Glucose Calibration (ONETOUCH VERIO) High SOLN Used to check accuracy of blood glucose machine. 1 each 0   chlorpheniramine (CHLORPHEN) 4 MG tablet  Take 1 tablet (4 mg total) by mouth every 4 (four) hours as needed for allergies. 45 tablet 0   Cholecalciferol (VITAMIN D3) 50 MCG (2000 UT) TABS Take 1 tablet by mouth daily. 90 tablet 3   Coenzyme Q10 (COQ10 PO) Take by mouth daily.     conjugated estrogens (PREMARIN) vaginal cream Place vaginally 2 (two) times a week. INSERT 0.5 APPLICATORS FULL VAGINALLY TWICE PER WEEK. AT BEDTIME. 30 g 4   cyanocobalamin (VITAMIN B12) 1000 MCG/ML injection Inject 1 mL (1,000 mcg total) into the muscle every 30 (thirty) days. 3 mL 3   cycloSPORINE (RESTASIS) 0.05 % ophthalmic emulsion Place 1 drop into both eyes 2 (two) times daily. 3 each 3   desonide (DESOWEN) 0.05 % cream Apply topically.     diazepam (VALIUM) 5 MG tablet Take 5 mg by mouth every 6 (six) hours as needed for anxiety.     diazepam (VALIUM) 5 MG tablet Take 1 tablet (5 mg total) by mouth every 6 (six) hours as needed for anxiety. 30 tablet 0   Docusate Calcium (STOOL SOFTENER PO) Take 1 tablet by mouth as needed.     EPINEPHrine 0.3 mg/0.3 mL IJ SOAJ injection 0.3 mg by injection route. 1 each 1   estradiol (VIVELLE-DOT) 0.1 MG/24HR patch Place 1 patch (0.1 mg total) onto the skin 2 (two) times a week. 24 patch 3   famotidine (PEPCID) 20 MG tablet Take 1 tablet (20 mg total) by mouth daily as needed  for heartburn or indigestion. 90 tablet 3   fexofenadine (ALLEGRA ALLERGY) 180 MG tablet Take 1 tablet (180 mg total) by mouth daily. 90 tablet 3   fluticasone (FLONASE) 50 MCG/ACT nasal spray Place 1 spray into both nostrils daily. 48 g 3   glucose blood (ONETOUCH VERIO) test strip Used to check blood sugars 3 times daily. 300 each 3   hydrocortisone-pramoxine (PROCTOFOAM-HC) rectal foam Place 1 applicator rectally 2 (two) times daily. 10 g 1   ipratropium (ATROVENT) 0.03 % nasal spray Place 2 sprays into both nostrils 3 (three) times daily as needed for rhinitis. 90 mL 3   levothyroxine (SYNTHROID) 50 MCG tablet Take 1 tablet (50 mcg total) by  mouth daily before breakfast. 90 tablet 3   losartan (COZAAR) 50 MG tablet TAKE 1 TABLET BY MOUTH EVERYDAY AT BEDTIME 30 tablet 4   MAGNESIUM PO Take 250 mg by mouth daily.     meclizine (ANTIVERT) 25 MG tablet Take 1 tablet (25 mg total) by mouth 3 (three) times daily as needed (vertigo). 84 tablet 3   meloxicam (MOBIC) 15 MG tablet Take 15 mg by mouth daily.     metoprolol succinate (TOPROL-XL) 50 MG 24 hr tablet TAKE 1 TABLET BY MOUTH EVERY DAY 30 tablet 4   montelukast (SINGULAIR) 10 MG tablet Take 1 tablet (10 mg total) by mouth at bedtime. 90 tablet 3   Multiple Vitamin (MULTIVITAMIN) tablet Take 1 tablet by mouth daily.     mupirocin ointment (BACTROBAN) 2 % Apply topically.     ondansetron (ZOFRAN ODT) 4 MG disintegrating tablet Take 1 tablet (4 mg total) by mouth every 8 (eight) hours as needed for nausea or vomiting. 30 tablet 2   OneTouch Delica Lancets 99991111 MISC Used to check blood sugars twice daly. 100 each 5   Propylene Glycol 0.6 % SOLN Apply 1 drop to eye as needed.     psyllium (METAMUCIL) 58.6 % powder Take 1 packet by mouth as needed.     Syringe/Needle, Disp, (SYRINGE 3CC/20GX1") 20G X 1" 3 ML MISC 1 Syringe by Does not apply route every 30 (thirty) days. For use with b-12 injection monthly 50 each 0   Tiotropium Bromide Monohydrate (SPIRIVA RESPIMAT) 1.25 MCG/ACT AERS Inhale 2 puffs into the lungs daily. 12 g 3   tretinoin (RETIN-A) 0.05 % cream Apply topically at bedtime. 45 g 1   urea (CARMOL) 40 % CREA Apply topically.     No current facility-administered medications on file prior to visit.    Review of Systems    Objective:    There were no vitals taken for this visit.  BP Readings from Last 3 Encounters:  01/06/23 126/80  11/19/22 132/60  08/22/22 130/78   Wt Readings from Last 3 Encounters:  01/06/23 178 lb 3.2 oz (80.8 kg)  11/19/22 172 lb 3.2 oz (78.1 kg)  08/22/22 181 lb (82.1 kg)    Physical Exam

## 2023-01-21 NOTE — Assessment & Plan Note (Signed)
Afebrile, nontoxic in appearance concern for bacterial sinusitis based on duration of symptoms.  Start doxycycline, probiotics.

## 2023-01-21 NOTE — Patient Instructions (Signed)
Start doxycycline.  Please avoid the sun while on this medication as it can make it easier to develop a sunburn.   Ensure to take probiotics while on antibiotics and also for 2 weeks after completion. This can either be by eating yogurt daily or taking a probiotic supplement over the counter such as Culturelle.It is important to re-colonize the gut with good bacteria and also to prevent any diarrheal infections associated with antibiotic use.   Sinus Infection, Adult A sinus infection, also called sinusitis, is inflammation of your sinuses. Sinuses are hollow spaces in the bones around your face. Your sinuses are located: Around your eyes. In the middle of your forehead. Behind your nose. In your cheekbones. Mucus normally drains out of your sinuses. When your nasal tissues become inflamed or swollen, mucus can become trapped or blocked. This allows bacteria, viruses, and fungi to grow, which leads to infection. Most infections of the sinuses are caused by a virus. A sinus infection can develop quickly. It can last for up to 4 weeks (acute) or for more than 12 weeks (chronic). A sinus infection often develops after a cold. What are the causes? This condition is caused by anything that creates swelling in the sinuses or stops mucus from draining. This includes: Allergies. Asthma. Infection from bacteria or viruses. Deformities or blockages in your nose or sinuses. Abnormal growths in the nose (nasal polyps). Pollutants, such as chemicals or irritants in the air. Infection from fungi. This is rare. What increases the risk? You are more likely to develop this condition if you: Have a weak body defense system (immune system). Do a lot of swimming or diving. Overuse nasal sprays. Smoke. What are the signs or symptoms? The main symptoms of this condition are pain and a feeling of pressure around the affected sinuses. Other symptoms include: Stuffy nose or congestion that makes it difficult to  breathe through your nose. Thick yellow or greenish drainage from your nose. Tenderness, swelling, and warmth over the affected sinuses. A cough that may get worse at night. Decreased sense of smell and taste. Extra mucus that collects in the throat or the back of the nose (postnasal drip) causing a sore throat or bad breath. Tiredness (fatigue). Fever. How is this diagnosed? This condition is diagnosed based on: Your symptoms. Your medical history. A physical exam. Tests to find out if your condition is acute or chronic. This may include: Checking your nose for nasal polyps. Viewing your sinuses using a device that has a light (endoscope). Testing for allergies or bacteria. Imaging tests, such as an MRI or CT scan. In rare cases, a bone biopsy may be done to rule out more serious types of fungal sinus disease. How is this treated? Treatment for a sinus infection depends on the cause and whether your condition is chronic or acute. If caused by a virus, your symptoms should go away on their own within 10 days. You may be given medicines to relieve symptoms. They include: Medicines that shrink swollen nasal passages (decongestants). A spray that eases inflammation of the nostrils (topical intranasal corticosteroids). Rinses that help get rid of thick mucus in your nose (nasal saline washes). Medicines that treat allergies (antihistamines). Over-the-counter pain relievers. If caused by bacteria, your health care provider may recommend waiting to see if your symptoms improve. Most bacterial infections will get better without antibiotic medicine. You may be given antibiotics if you have: A severe infection. A weak immune system. If caused by narrow nasal passages or nasal  polyps, surgery may be needed. Follow these instructions at home: Medicines Take, use, or apply over-the-counter and prescription medicines only as told by your health care provider. These may include nasal sprays. If  you were prescribed an antibiotic medicine, take it as told by your health care provider. Do not stop taking the antibiotic even if you start to feel better. Hydrate and humidify  Drink enough fluid to keep your urine pale yellow. Staying hydrated will help to thin your mucus. Use a cool mist humidifier to keep the humidity level in your home above 50%. Inhale steam for 10-15 minutes, 3-4 times a day, or as told by your health care provider. You can do this in the bathroom while a hot shower is running. Limit your exposure to cool or dry air. Rest Rest as much as possible. Sleep with your head raised (elevated). Make sure you get enough sleep each night. General instructions  Apply a warm, moist washcloth to your face 3-4 times a day or as told by your health care provider. This will help with discomfort. Use nasal saline washes as often as told by your health care provider. Wash your hands often with soap and water to reduce your exposure to germs. If soap and water are not available, use hand sanitizer. Do not smoke. Avoid being around people who are smoking (secondhand smoke). Keep all follow-up visits. This is important. Contact a health care provider if: You have a fever. Your symptoms get worse. Your symptoms do not improve within 10 days. Get help right away if: You have a severe headache. You have persistent vomiting. You have severe pain or swelling around your face or eyes. You have vision problems. You develop confusion. Your neck is stiff. You have trouble breathing. These symptoms may be an emergency. Get help right away. Call 911. Do not wait to see if the symptoms will go away. Do not drive yourself to the hospital. Summary A sinus infection is soreness and inflammation of your sinuses. Sinuses are hollow spaces in the bones around your face. This condition is caused by nasal tissues that become inflamed or swollen. The swelling traps or blocks the flow of mucus.  This allows bacteria, viruses, and fungi to grow, which leads to infection. If you were prescribed an antibiotic medicine, take it as told by your health care provider. Do not stop taking the antibiotic even if you start to feel better. Keep all follow-up visits. This is important. This information is not intended to replace advice given to you by your health care provider. Make sure you discuss any questions you have with your health care provider. Document Revised: 09/17/2021 Document Reviewed: 09/17/2021 Elsevier Patient Education  [employer].

## 2023-01-26 NOTE — Progress Notes (Addendum)
01/27/2023 Angela Mendoza [id number] 10-12-1951  Referring provider: Sherlene Shams, MD Primary GI doctor: Dr. Orvan Falconer (Requesting Dr. Chales Abrahams)  ASSESSMENT AND PLAN:   Rectal bleeding, history of internal hemorrhoids and rectal fissure seen on examination in setting of chronic constipation worsened after recent bladder sling surgery at [facility] Patient reassured, most likely this is rectal fissure and/or internal hemorrhoids worsened by constipation after bladder splinting in November. 07/2022 colonoscopy Dr. Orvan Falconer nonbleeding internal hemorrhoids, 1 diminutive TA polyp Sitz baths, high-fiber diet, add on benefiber, MiraLAX daily Will refer to pelvic floor physical therapy Long discussion about preventing constipation discussed in detail for prevention.  Diltiazem2%/lidocaine 5% 3 x daily for 2 months on rectum #30 gram 1 refill Follow up 2-3 months for secondary evaluation and possible surgical referral for repair if not improved. May also benefit from internal hemorrhoidal banding, can decide at next office visit.  Screening for colon cancer 07/2022 colonoscopy internal hemorrhoids, 3 mm tubular adenomatous polyp no recall due to age.  Gastroesophageal reflux disease, unspecified whether esophagitis present Lifestyle changes discussed, avoid NSAIDS, ETOH Continue on the same medication, reports symptoms are well controlled.     Patient Care Team: Sherlene Shams, MD as PCP - General (Internal Medicine)  HISTORY OF PRESENT ILLNESS: 72 y.o. female with a past medical history of cystocele with uterine prolapse, obstructive sleep apnea on CPAP, intrinsic asthma, fibrocystic breast disease, atrophic vaginitis, and a remote history of atrial fibrillation and others listed below, presents for hospital follow up.  08/22/2022 endoscopy with Dr. Orvan Falconer showed congested mucosa in esophagus, gastritis, multiple gastric polyps, normal duodenum, negative H. pylori, negative EOE, negative  Barrett's, and benign polyps 08/22/2022 colonoscopy with Dr. Orvan Falconer showed nonbleeding internal hemorrhoids, diverticulosis, 3 mm polyp rectum, 3 mm polyp ascending colon, only 1 was TA polyp  Patient had bladder sling surgery at [facility] in Nov 2023, may still need rectocele repair. She was very constipated right after the surgery, 2 weeks had to go back to have the sling loosened.  She was on miralax twice a day afterwards that helped but weaned off and hemorrhoids were worse.  She takes fiber now, felt had more issues with BM.    She has incomplete Bm's, will have to wait 20-30 mins and then will have another BM. Had subsequent irritated hemorrhoids, bleeding with wiping about end of Feb and feels hard to clear rectum.  Worsening bleeding. Can have burning/itching at rectum occ.  Has wedding planned next week in the family, increase stress, not eating well.  No fever, chills. Mild nausea with vertigo, just finished ABX with sinus infection.   ADDENDUM: Below was auto generated from previous input, patient contacted our office and wanted chart correction. She does not use ETOH at this time.  She  reports that she quit smoking about 45 years ago. Her smoking use included cigarettes. She has a 2.10 pack-year smoking history. She has been exposed to tobacco smoke. She has never used smokeless tobacco. She reports current alcohol use. She reports that she does not use drugs.  RELEVANT LABS AND IMAGING: CBC    Component Value Date/Time   WBC 7.1 11/19/2022 1448   RBC 4.15 11/19/2022 1448   HGB 13.0 11/19/2022 1448   HGB 13.7 12/07/2012 0000   HCT 38.1 11/19/2022 1448   PLT 300.0 11/19/2022 1448   MCV 91.9 11/19/2022 1448   MCH 32.1 05/30/2022 1432   MCHC 34.1 11/19/2022 1448   RDW 13.3 11/19/2022 1448  LYMPHSABS 2.5 11/19/2022 1448   MONOABS 0.7 11/19/2022 1448   EOSABS 0.1 11/19/2022 1448   BASOSABS 0.1 11/19/2022 1448   Recent Labs    02/27/22 1301 05/28/22 1015 05/30/22 1432  06/10/22 0941 11/19/22 1448  HGB 12.8 13.5 13.3 13.1 13.0     CMP     Component Value Date/Time   NA 137 11/19/2022 1448   NA 142 09/26/2016 0000   K 4.5 11/19/2022 1448   CL 99 11/19/2022 1448   CO2 28 11/19/2022 1448   GLUCOSE 99 11/19/2022 1448   BUN 12 11/19/2022 1448   BUN 12 09/26/2016 0000   CREATININE 0.82 11/19/2022 1448   CREATININE 0.97 05/30/2022 1428   CALCIUM 9.7 11/19/2022 1448   PROT 7.4 11/19/2022 1448   ALBUMIN 4.5 11/19/2022 1448   AST 21 11/19/2022 1448   AST 22 04/12/2013 0000   ALT 21 11/19/2022 1448   ALKPHOS 76 11/19/2022 1448   ALKPHOS 50 04/12/2013 0000   BILITOT 0.9 11/19/2022 1448   BILITOT 0.9 04/12/2013 0000   GFRNONAA >60 12/29/2020 1419      Latest Ref Rng & Units 11/19/2022    2:48 PM 05/30/2022    2:28 PM 05/28/2022   10:15 AM  Hepatic Function  Total Protein 6.0 - 8.3 g/dL 7.4  7.0  7.3   Albumin 3.5 - 5.2 g/dL 4.5   4.5   AST 0 - 37 U/L 21  26  23    ALT 0 - 35 U/L 21  25  25    Alk Phosphatase 39 - 117 U/L 76   66   Total Bilirubin 0.2 - 1.2 mg/dL 0.9  1.2  0.9       Current Medications:   Current Outpatient Medications (Endocrine & Metabolic):    estradiol (VIVELLE-DOT) 0.1 MG/24HR patch, Place 1 patch (0.1 mg total) onto the skin 2 (two) times a week.   levothyroxine (SYNTHROID) 50 MCG tablet, Take 1 tablet (50 mcg total) by mouth daily before breakfast.  Current Outpatient Medications (Cardiovascular):    atorvastatin (LIPITOR) 40 MG tablet, Take 1 tablet (40 mg total) by mouth daily.   EPINEPHrine 0.3 mg/0.3 mL IJ SOAJ injection, 0.3 mg by injection route.   losartan (COZAAR) 50 MG tablet, TAKE 1 TABLET BY MOUTH EVERYDAY AT BEDTIME   metoprolol succinate (TOPROL-XL) 50 MG 24 hr tablet, TAKE 1 TABLET BY MOUTH EVERY DAY  Current Outpatient Medications (Respiratory):    albuterol (PROVENTIL HFA) 108 (90 Base) MCG/ACT inhaler, Inhale 2 puffs into the lungs every 6 (six) hours as needed.   azelastine (ASTELIN) 0.1 % nasal  spray, Place 2 sprays into both nostrils 2 (two) times daily. Use in each nostril as directed   benzonatate (TESSALON) 200 MG capsule, Take 1 capsule (200 mg total) by mouth 3 (three) times daily as needed for cough.   chlorpheniramine (CHLORPHEN) 4 MG tablet, Take 1 tablet (4 mg total) by mouth every 4 (four) hours as needed for allergies.   fexofenadine (ALLEGRA ALLERGY) 180 MG tablet, Take 1 tablet (180 mg total) by mouth daily.   fluticasone (FLONASE) 50 MCG/ACT nasal spray, Place 1 spray into both nostrils daily.   ipratropium (ATROVENT) 0.03 % nasal spray, Place 2 sprays into both nostrils 3 (three) times daily as needed for rhinitis.   montelukast (SINGULAIR) 10 MG tablet, Take 1 tablet (10 mg total) by mouth at bedtime.   Tiotropium Bromide Monohydrate (SPIRIVA RESPIMAT) 1.25 MCG/ACT AERS, Inhale 2 puffs into the lungs daily.  Current  Outpatient Medications (Analgesics):    meloxicam (MOBIC) 15 MG tablet, Take 15 mg by mouth daily.  Current Outpatient Medications (Hematological):    cyanocobalamin (VITAMIN B12) 1000 MCG/ML injection, Inject 1 mL (1,000 mcg total) into the muscle every 30 (thirty) days.  Current Outpatient Medications (Other):    ALPRAZolam (XANAX) 0.25 MG tablet, TAKE 1 TABLET (0.25 MG TOTAL) BY MOUTH ONCE DAILY AS NEEDED FOR SLEEP.   AMBULATORY NON FORMULARY MEDICATION, Medication Name: Diltiazem 2%/Lidocaine 2%   Using your index finger apply a small amount of medication inside the anal opening and to the external anal area twice daily x 6 weeks.   Blood Glucose Calibration (ONETOUCH VERIO) High SOLN, Used to check accuracy of blood glucose machine.   Cholecalciferol (VITAMIN D3) 50 MCG (2000 UT) TABS, Take 1 tablet by mouth daily.   Coenzyme Q10 (COQ10 PO), Take by mouth daily.   conjugated estrogens (PREMARIN) vaginal cream, Place vaginally 2 (two) times a week. INSERT 0.5 APPLICATORS FULL VAGINALLY TWICE PER WEEK. AT BEDTIME.   cycloSPORINE (RESTASIS) 0.05 %  ophthalmic emulsion, Place 1 drop into both eyes 2 (two) times daily.   desonide (DESOWEN) 0.05 % cream, Apply topically.   diazepam (VALIUM) 5 MG tablet, Take 5 mg by mouth every 6 (six) hours as needed for anxiety.   diazepam (VALIUM) 5 MG tablet, Take 1 tablet (5 mg total) by mouth every 6 (six) hours as needed for anxiety.   Docusate Calcium (STOOL SOFTENER PO), Take 1 tablet by mouth as needed.   famotidine (PEPCID) 20 MG tablet, Take 1 tablet (20 mg total) by mouth daily as needed for heartburn or indigestion.   glucose blood (ONETOUCH VERIO) test strip, Used to check blood sugars 3 times daily.   hydrocortisone-pramoxine (PROCTOFOAM-HC) rectal foam, Place 1 applicator rectally 2 (two) times daily.   MAGNESIUM PO, Take 250 mg by mouth daily.   meclizine (ANTIVERT) 25 MG tablet, Take 1 tablet (25 mg total) by mouth 3 (three) times daily as needed (vertigo).   Multiple Vitamin (MULTIVITAMIN) tablet, Take 1 tablet by mouth daily.   mupirocin ointment (BACTROBAN) 2 %, Apply topically.   ondansetron (ZOFRAN ODT) 4 MG disintegrating tablet, Take 1 tablet (4 mg total) by mouth every 8 (eight) hours as needed for nausea or vomiting.   OneTouch Delica Lancets 33G MISC, Used to check blood sugars twice daly.   Propylene Glycol 0.6 % SOLN, Apply 1 drop to eye as needed.   psyllium (METAMUCIL) 58.6 % powder, Take 1 packet by mouth as needed.   Syringe/Needle, Disp, (SYRINGE 3CC/20GX1") 20G X 1" 3 ML MISC, 1 Syringe by Does not apply route every 30 (thirty) days. For use with b-12 injection monthly   tretinoin (RETIN-A) 0.05 % cream, Apply topically at bedtime.   urea (CARMOL) 40 % CREA, Apply topically.  Medical History:  Past Medical History:  Diagnosis Date   Agatston coronary artery calcium score less than 100    a. 06/2018 Cardiac CT: Ca2+ = 3 (49th %'ile).   Allergic rhinitis 06/09/2008   chronic   Allergy    Aortic atherosclerosis    a. 06/2018 noted on chest CT.   Arthritis    Asthma,  intrinsic 10/06/2011   controlled on qvar. Cleda Daub 09/2011:  Very mild airflow obstruction (FEV1% 68, FVL c/w obstruction)    Basal cell carcinoma 06/10/2021   right prox nasal ala rim - MOHs 07/12/21 Dr. Adriana Simas   Cataract    Chronic headache 06/09/2008   COPD (chronic  obstructive pulmonary disease)    Depression    Diverticulosis 2013   h/o diverticulitis   Dry eyes    GERD (gastroesophageal reflux disease)    Heart murmur    History of anal fissures    x2   History of breast implant removal    silicone mastitis - R axilla silicone LN, free silicone L breast upper outer quadrant   Hot flashes    HYPERLIPIDEMIA 06/09/2008   HYPERTENSION 06/09/2008   Hypothyroidism    MVP (mitral valve prolapse)    OSA on CPAP    [name]   PAF (paroxysmal atrial fibrillation) 06/09/2008   a. CHA2DS2VASc = 3-->prefers ASA.   Pernicious anemia    per prior pcp   Rosacea    Sleep apnea    Surgical menopause 1991   Vertigo    Vitamin D deficiency    Allergies:  Allergies  Allergen Reactions   Codeine Other (See Comments)    Low blood pressure/nausea   Qvar [Beclomethasone] Other (See Comments)    Thrush per patient even with rinsing.    Sulfonamide Derivatives Rash    rash     Surgical History:  She  has a past surgical history that includes Cholecystectomy (2007); Appendectomy (2007); Tubal ligation; Total abdominal hysterectomy (1991); Nasal sinus surgery; Breast enhancement surgery (2006); Cardiovascular stress test (2013); Colonoscopy (08/2012); Esophagogastroduodenoscopy (08/2012); US ECHOCARDIOGRAPHY (09/2009); US ECHOCARDIOGRAPHY (11/2007); sleep study (08/2007); Rectocele repair (12/2008); Colonoscopy (2006); Breast biopsy (Left, 09/27/2009); Hernia repair; bladder tack; tummy tuck; Esophagogastroduodenoscopy (11/02/2008); and Cesarean section. Family History:  Her family history includes AAA (abdominal aortic aneurysm) in her paternal uncle; Aortic dissection in her maternal aunt and  paternal uncle; Breast cancer in her maternal aunt; COPD in her sister; Cancer in her cousin and paternal aunt; Cancer (age of onset: 6) in her father; Cancer (age of onset: 61) in her mother; Colon polyps in her sister; Heart attack in her paternal uncle; Heart disease in her paternal uncle; Heart failure in her sister; Hypertension in her sister; Stroke in her paternal uncle and sister.  REVIEW OF SYSTEMS  : All other systems reviewed and negative except where noted in the History of Present Illness.  PHYSICAL EXAM: BP 132/70   Pulse 70   Ht 5\' 5"  (1.651 m)   Wt 179 lb (81.2 kg)   SpO2 96%   BMI 29.79 kg/m  General Appearance: Well nourished, in no apparent distress. Head:   Normocephalic and atraumatic. Eyes:  sclerae anicteric,conjunctive pink  Respiratory: Respiratory effort normal, BS equal bilaterally without rales, rhonchi, wheezing. Cardio: RRR with no MRGs. Peripheral pulses intact.  Abdomen: Soft,  Obese ,active bowel sounds. No tenderness . Without guarding and Without rebound. No masses. Rectal: posterior fissure noted, tender, normal to increased rectal tone, + internal hemorrhoids appreciated, no masses, non tender, brown stool, hemoccult Negative Musculoskeletal: Full ROM, Normal gait. Without edema. Skin:  Dry and intact without significant lesions or rashes Neuro: Alert and  oriented x4;  No focal deficits. Psych:  Cooperative. Normal mood and affect.    Doree Albee, PA-C 12:07 PM

## 2023-01-27 ENCOUNTER — Ambulatory Visit (INDEPENDENT_AMBULATORY_CARE_PROVIDER_SITE_OTHER): Payer: Medicare Other | Admitting: Physician Assistant

## 2023-01-27 ENCOUNTER — Encounter: Payer: Self-pay | Admitting: Physician Assistant

## 2023-01-27 VITALS — BP 132/70 | HR 70 | Ht 65.0 in | Wt 179.0 lb

## 2023-01-27 DIAGNOSIS — Z1211 Encounter for screening for malignant neoplasm of colon: Secondary | ICD-10-CM | POA: Diagnosis not present

## 2023-01-27 DIAGNOSIS — K219 Gastro-esophageal reflux disease without esophagitis: Secondary | ICD-10-CM | POA: Diagnosis not present

## 2023-01-27 DIAGNOSIS — K5904 Chronic idiopathic constipation: Secondary | ICD-10-CM | POA: Diagnosis not present

## 2023-01-27 DIAGNOSIS — K602 Anal fissure, unspecified: Secondary | ICD-10-CM | POA: Diagnosis not present

## 2023-01-27 MED ORDER — AMBULATORY NON FORMULARY MEDICATION: 1 refills | Status: DC

## 2023-01-27 NOTE — Patient Instructions (Signed)
Anal Fissure, Adult  Diltiazem/lidocaine 3 x daily for 2 months sent to compound pharmacy   Sent this medication to a compound pharmacy:  [facility] [address], [place], [place] [address]  [phone]   Please DO NOT go directly from our office to pick up this medication! Give the pharmacy 1 day to process the prescription. Extra time is required for them to compound your medication.  Follow up should symptoms persist for secondary evaluation and possible surgical referral for repair.  Check with surgeon if you could benefit from pelvic floor PT.  Can refer you for this.   Miralax is an osmotic laxative.  It only brings more water into the stool.  This is safe to take daily.  Can take up to 17 gram of miralax twice a day.  Mix with juice or coffee.  Start 1 capful at night for 3-4 days and reassess your response in 3-4 days.  You can increase and decrease the dose based on your response.  Remember, it can take up to 3-4 days to take effect OR for the effects to wear off.   I often pair this with benefiber in the morning to help assure the stool is not too loose.   Here some information about pelvic floor dysfunction. This may be contributing to some of your symptoms. We will continue with our evaluation but I do want you to consider adding on fiber supplement with low-dose MiraLAX daily. We could also refer to pelvic floor physical therapy.   Pelvic Floor Dysfunction, Female Pelvic floor dysfunction (PFD) is a condition that results when the group of muscles and connective tissues that support the organs in the pelvis (pelvic floor muscles) do not work well. These muscles and their connections form a sling that supports the colon and bladder. In women, they also support the uterus. PFD causes pelvic floor muscles to be too weak, too tight, or both. In PFD, muscle movements are not coordinated. This may cause bowel or bladder problems. It may also cause  pain. What are the causes? This condition may be caused by an injury to the pelvic area or by a weakening of pelvic muscles. This often results from pregnancy and childbirth or other types of strain. In many cases, the exact cause is not known. What increases the risk? The following factors may make you more likely to develop this condition: Having chronic bladder tissue inflammation (interstitial cystitis). Being an older person. Being overweight. History of radiation treatment for cancer in the pelvic region. Previous pelvic surgery, such as removal of the uterus (hysterectomy). What are the signs or symptoms? Symptoms of this condition vary and may include: Bladder symptoms, such as: Trouble starting urination and emptying the bladder. Frequent urinary tract infections. Leaking urine when coughing, laughing, or exercising (stress incontinence). Having to pass urine urgently or frequently. Pain when passing urine. Bowel symptoms, such as: Constipation. Urgent or frequent bowel movements. Incomplete bowel movements. Painful bowel movements. Leaking stool or gas. Unexplained genital or rectal pain. Genital or rectal muscle spasms. Low back pain. Other symptoms may include: A heavy, full, or aching feeling in the vagina. A bulge that protrudes into the vagina. Pain during or after sex. How is this diagnosed? This condition may be diagnosed based on: Your symptoms and medical history. A physical exam. During the exam, your health care provider may check your pelvic muscles for tightness, spasm, pain, or weakness. This may include a rectal exam and a pelvic exam. In  some cases, you may have diagnostic tests, such as: Electrical muscle function tests. Urine flow testing. X-ray tests of bowel function. Ultrasound of the pelvic organs. How is this treated? Treatment for this condition depends on the symptoms. Treatment options include: Physical therapy. This may include Kegel  exercises to help relax or strengthen the pelvic floor muscles. Biofeedback. This type of therapy provides feedback on how tight your pelvic floor muscles are so that you can learn to control them. Internal or external massage therapy. A treatment that involves electrical stimulation of the pelvic floor muscles to help control pain (transcutaneous electrical nerve stimulation, or TENS). Sound wave therapy (ultrasound) to reduce muscle spasms. Medicines, such as: Muscle relaxants. Bladder control medicines. Surgery to reconstruct or support pelvic floor muscles may be an option if other treatments do not help. Follow these instructions at home: Activity Do your usual activities as told by your health care provider. Ask your health care provider if you should modify any activities. Do pelvic floor strengthening or relaxing exercises at home as told by your physical therapist. Lifestyle Maintain a healthy weight. Eat foods that are high in fiber, such as beans, whole grains, and fresh fruits and vegetables. Limit foods that are high in fat and processed sugars, such as fried or sweet foods. Manage stress with relaxation techniques such as yoga or meditation. General instructions If you have problems with leakage: Use absorbable pads or wear padded underwear. Wash frequently with mild soap. Keep your genital and anal area as clean and dry as possible. Ask your health care provider if you should try a barrier cream to prevent skin irritation. Take warm baths to relieve pelvic muscle tension or spasms. Take over-the-counter and prescription medicines only as told by your health care provider. Keep all follow-up visits. How is this prevented? The cause of PFD is not always known, but there are a few things you can do to reduce the risk of developing this condition, including: Staying at a healthy weight. Getting regular exercise. Managing stress. Contact a health care provider if: Your  symptoms are not improving with home care. You have signs or symptoms of PFD that get worse at home. You develop new signs or symptoms. You have signs of a urinary tract infection, such as: Fever. Chills. Increased urinary frequency. A burning feeling when urinating. You have not had a bowel movement in 3 days (constipation). Summary Pelvic floor dysfunction results when the muscles and connective tissues in your pelvic floor do not work well. These muscles and their connections form a sling that supports your colon and bladder. In women, they also support the uterus. PFD may be caused by an injury to the pelvic area or by a weakening of pelvic muscles. PFD causes pelvic floor muscles to be too weak, too tight, or a combination of both. Symptoms may vary from person to person. In most cases, PFD can be treated with physical therapies and medicines. Surgery may be an option if other treatments do not help. This information is not intended to replace advice given to you by your health care provider. Make sure you discuss any questions you have with your health care provider. Document Revised: 02/20/2021 Document Reviewed: 02/20/2021 Elsevier Patient Education  Angela Mendoza.

## 2023-02-01 NOTE — Progress Notes (Signed)
Agree with assessment/plan.  Raj Mitchelle Goerner, MD [facility] [phone]  

## 2023-02-04 DIAGNOSIS — J441 Chronic obstructive pulmonary disease with (acute) exacerbation: Secondary | ICD-10-CM | POA: Diagnosis not present

## 2023-02-04 DIAGNOSIS — Z03818 Encounter for observation for suspected exposure to other biological agents ruled out: Secondary | ICD-10-CM | POA: Diagnosis not present

## 2023-02-04 DIAGNOSIS — J209 Acute bronchitis, unspecified: Secondary | ICD-10-CM | POA: Diagnosis not present

## 2023-02-04 DIAGNOSIS — H66002 Acute suppurative otitis media without spontaneous rupture of ear drum, left ear: Secondary | ICD-10-CM | POA: Diagnosis not present

## 2023-02-04 DIAGNOSIS — J019 Acute sinusitis, unspecified: Secondary | ICD-10-CM | POA: Diagnosis not present

## 2023-02-04 DIAGNOSIS — B3731 Acute candidiasis of vulva and vagina: Secondary | ICD-10-CM | POA: Diagnosis not present

## 2023-02-04 DIAGNOSIS — B9689 Other specified bacterial agents as the cause of diseases classified elsewhere: Secondary | ICD-10-CM | POA: Diagnosis not present

## 2023-02-10 ENCOUNTER — Telehealth: Payer: Self-pay | Admitting: Pulmonary Disease

## 2023-02-10 ENCOUNTER — Telehealth: Payer: Self-pay | Admitting: Family Medicine

## 2023-02-10 ENCOUNTER — Emergency Department: Payer: Medicare Other

## 2023-02-10 ENCOUNTER — Other Ambulatory Visit: Payer: Self-pay

## 2023-02-10 ENCOUNTER — Emergency Department
Admission: EM | Admit: 2023-02-10 | Discharge: 2023-02-10 | Disposition: A | Discharge status: Home / Self Care | Payer: Medicare Other | Attending: Emergency Medicine | Admitting: Emergency Medicine

## 2023-02-10 DIAGNOSIS — I10 Essential (primary) hypertension: Secondary | ICD-10-CM | POA: Insufficient documentation

## 2023-02-10 DIAGNOSIS — Z8616 Personal history of COVID-19: Secondary | ICD-10-CM | POA: Insufficient documentation

## 2023-02-10 DIAGNOSIS — E039 Hypothyroidism, unspecified: Secondary | ICD-10-CM | POA: Insufficient documentation

## 2023-02-10 DIAGNOSIS — I4891 Unspecified atrial fibrillation: Secondary | ICD-10-CM | POA: Insufficient documentation

## 2023-02-10 DIAGNOSIS — J45901 Unspecified asthma with (acute) exacerbation: Secondary | ICD-10-CM | POA: Insufficient documentation

## 2023-02-10 DIAGNOSIS — R0602 Shortness of breath: Secondary | ICD-10-CM | POA: Diagnosis not present

## 2023-02-10 DIAGNOSIS — J069 Acute upper respiratory infection, unspecified: Secondary | ICD-10-CM | POA: Insufficient documentation

## 2023-02-10 DIAGNOSIS — Z85828 Personal history of other malignant neoplasm of skin: Secondary | ICD-10-CM | POA: Diagnosis not present

## 2023-02-10 DIAGNOSIS — E119 Type 2 diabetes mellitus without complications: Secondary | ICD-10-CM | POA: Diagnosis not present

## 2023-02-10 DIAGNOSIS — R059 Cough, unspecified: Secondary | ICD-10-CM | POA: Diagnosis not present

## 2023-02-10 DIAGNOSIS — B9789 Other viral agents as the cause of diseases classified elsewhere: Secondary | ICD-10-CM | POA: Diagnosis not present

## 2023-02-10 LAB — CBC
HCT: 35.8 % — ABNORMAL LOW (ref 36.0–46.0)
Hemoglobin: 11.7 g/dL — ABNORMAL LOW (ref 12.0–15.0)
MCH: 30.2 pg (ref 26.0–34.0)
MCHC: 32.7 g/dL (ref 30.0–36.0)
MCV: 92.5 fL (ref 80.0–100.0)
Platelets: 290 10*3/uL (ref 150–400)
RBC: 3.87 MIL/uL (ref 3.87–5.11)
RDW: 13.3 % (ref 11.5–15.5)
WBC: 6.7 10*3/uL (ref 4.0–10.5)
nRBC: 0 % (ref 0.0–0.2)

## 2023-02-10 LAB — BASIC METABOLIC PANEL
Anion gap: 10 (ref 5–15)
BUN: 15 mg/dL (ref 8–23)
CO2: 23 mmol/L (ref 22–32)
Calcium: 8.8 mg/dL — ABNORMAL LOW (ref 8.9–10.3)
Chloride: 100 mmol/L (ref 98–111)
Creatinine, Ser: 0.69 mg/dL (ref 0.44–1.00)
GFR, Estimated: >60 mL/min (ref >60)
Glucose, Bld: 117 mg/dL — ABNORMAL HIGH (ref 70–99)
Potassium: 4 mmol/L (ref 3.5–5.1)
Sodium: 133 mmol/L — ABNORMAL LOW (ref 135–145)

## 2023-02-10 LAB — TROPONIN I (HIGH SENSITIVITY): Troponin I (High Sensitivity): 2 ng/L (ref <18)

## 2023-02-10 MED ORDER — BENZONATATE 100 MG PO CAPS: 100.0000 mg | ORAL_CAPSULE | Freq: Three times a day (TID) | ORAL | 0 refills | Status: DC | PRN

## 2023-02-10 MED ORDER — METHYLPREDNISOLONE SODIUM SUCC 40 MG IJ SOLR
40.0000 mg | Freq: Every day | INTRAMUSCULAR | Status: DC
  Administered 2023-02-10: 40 mg via INTRAVENOUS
  Filled 2023-02-10: qty 1

## 2023-02-10 MED ORDER — IPRATROPIUM-ALBUTEROL 0.5-2.5 (3) MG/3ML IN SOLN
3.0000 mL | Freq: Once | RESPIRATORY_TRACT | Status: AC
  Administered 2023-02-10: 3 mL via RESPIRATORY_TRACT
  Filled 2023-02-10: qty 3

## 2023-02-10 MED ORDER — PREDNISONE 50 MG PO TABS: ORAL_TABLET | ORAL | 0 refills | Status: DC

## 2023-02-10 NOTE — Telephone Encounter (Signed)
She needs to go to the ER and be assessed for hospital admission with IV antibiotics and steroids, nebulizer therapy, and supplemental oxygen.

## 2023-02-10 NOTE — ED Notes (Signed)
Discharge instructions reviewed with patient. Patient questions answered and opportunity for education reviewed. Patient voices understanding of discharge instructions with no further questions. Patient ambulatory with steady gait to lobby.  

## 2023-02-10 NOTE — Telephone Encounter (Signed)
Called and spoke to patient. Patient stated that she was seen at [facility] 02/04/2023, tx with prednisone and Augmentin for Asthma flare. She will complete abx Friday and prednisone today. No improvement in sx with abx and PNA. She feels that sx have worsened since 02/04/2023. She is concerned that she is developing PNA.  C/o prod cough with green sputum, increased SOB, wheezing, voice hoariness, chills, sweats and just feels unwell.  She is using mucinex BID, albuterol HFA 3-4x daily and spiriva once daily. No acute visits available.   Dr. Craige Cotta, please advise. Thanks

## 2023-02-10 NOTE — ED Provider Notes (Signed)
[facility] Provider Note    Event Date/Time   First MD Initiated Contact with Patient 02/10/23 1401     (approximate)   History   Cough   HPI  Angela Mendoza is a 72 y.o. female past medical history of asthma who presents today with concerns for pneumonia.  Patient reports that she developed nasal congestion, sinus pressure, and cough last week.  She reports that she went to her son's wedding over the weekend and they were lots of fans and the wind was blowing and she thinks that the pollen exacerbated her symptoms.  She also reports that she did a lot of talking and thinks that this may have worsened her symptoms as well.  She reports that she has coughing fits.  She is concerned because she has yellow sputum and yellow nasal discharge.  No fevers or chills.  No chest pain or shortness of breath.  She called her pulmonologist who could not see her in clinic today and recommended that she come to the emergency department.  She reports that she was prescribed Augmentin last week which she has been taking in addition to prednisone and Tessalon Perles.  She is concerned because she only has 1 dose of prednisone left and feels that this is helping her so she would like this to be refilled.  She is also requesting a dose of IV steroids that she thinks that this will be more helpful for her today as well.  Patient Active Problem List   Diagnosis Date Noted   Sinusitis 01/19/2023   Onychomycosis 11/20/2022   Leg swelling 07/18/2022   SOB (shortness of breath) 07/18/2022   Bleeding external hemorrhoids 07/14/2022   Dysuria 07/08/2022   Skin lesion of back 07/04/2022   Hyperlipidemia associated with type 2 diabetes mellitus 06/01/2022   Plantar fasciitis of left foot 06/01/2022   Controlled type 2 diabetes mellitus without complication, without long-term current use of insulin 05/30/2022   Atrophic vaginitis 02/27/2022   Cystocele without uterine prolapse 02/27/2022    Chronic rhinitis 01/01/2022   Basal cell carcinoma (BCC) of ala nasi 06/24/2021   COVID-19 virus infection 02/17/2021   LUQ abdominal pain 01/17/2021   Insomnia due to medical condition 05/29/2020   Educated about COVID-19 virus infection 02/10/2019   History of atrial fibrillation 07/02/2018   Paroxysmal atrial fibrillation 07/02/2018   B12 deficiency 05/02/2018   Vaginal vault prolapse after hysterectomy 05/01/2018   Trigger finger, right ring finger 06/09/2017   GAD (generalized anxiety disorder) 05/23/2017   Reaction to Pneumovax immunization 12/30/2015   Cerebral microvascular disease 10/12/2015   Fecal retention 04/14/2015   Allergic urticaria 11/18/2014   History of diverticulitis 11/11/2014   Family history of colonic polyps 11/11/2014   S/P cholecystectomy 11/11/2014   Upper airway cough syndrome 10/27/2014   Hyperkeratosis of sole 12/19/2013   Oral candidiasis 12/19/2013   GERD (gastroesophageal reflux disease)    Hypothyroidism    Menopausal syndrome    Asthma, intrinsic 10/06/2011   OSA on CPAP 06/09/2008   Essential hypertension, benign 06/09/2008   Non-seasonal allergic rhinitis 06/09/2008   ANGINA, HX OF 06/09/2008          Physical Exam   Triage Vital Signs: ED Triage Vitals  Enc Vitals Group     BP 02/10/23 1215 (!) 153/77     Pulse Rate 02/10/23 1215 71     Resp 02/10/23 1215 20     Temp 02/10/23 1215 98.3 F (36.8 C)  Temp Source 02/10/23 1215 Oral     SpO2 02/10/23 1215 98 %     Weight 02/10/23 1218 178 lb (80.7 kg)     Height 02/10/23 1218 5\' 5"  (1.651 m)     Head Circumference --      Peak Flow --      Pain Score 02/10/23 1226 7     Pain Loc --      Pain Edu? --      Excl. in GC? --     Most recent vital signs: Vitals:   02/10/23 1215 02/10/23 1610  BP: (!) 153/77 (!) 144/80  Pulse: 71 80  Resp: 20 19  Temp: 98.3 F (36.8 C) 98.6 F (37 C)  SpO2: 98% 97%    Physical Exam Vitals and nursing note reviewed.   Constitutional:      General: Awake and alert. No acute distress.    Appearance: Normal appearance. The patient is overweight.  HENT:     Head: Normocephalic and atraumatic.     Mouth: Mucous membranes are moist.  Eyes:     General: PERRL. Normal EOMs        Right eye: No discharge.        Left eye: No discharge.     Conjunctiva/sclera: Conjunctivae normal.  Cardiovascular:     Rate and Rhythm: Normal rate and regular rhythm.     Pulses: Normal pulses.  Pulmonary:     Effort: Pulmonary effort is normal. No respiratory distress.  No accessory muscle use.  Able to speak in long sentences without respiratory distress.  No tachypnea.    Breath sounds: Normal breath sounds.  Faint expiratory wheezes in upper lung fields Abdominal:     Abdomen is soft. There is no abdominal tenderness. No rebound or guarding. No distention. Musculoskeletal:        General: No swelling. Normal range of motion.     Cervical back: Normal range of motion and neck supple.  No lower extremity swelling or pitting edema Skin:    General: Skin is warm and dry.     Capillary Refill: Capillary refill takes less than 2 seconds.     Findings: No rash.  Neurological:     Mental Status: The patient is awake and alert.      ED Results / Procedures / Treatments   Labs (all labs ordered are listed, but only abnormal results are displayed) Labs Reviewed  BASIC METABOLIC PANEL - Abnormal; Notable for the following components:      Result Value   Sodium 133 (*)    Glucose, Bld 117 (*)    Calcium 8.8 (*)    All other components within normal limits  CBC - Abnormal; Notable for the following components:   Hemoglobin 11.7 (*)    HCT 35.8 (*)    All other components within normal limits  TROPONIN I (HIGH SENSITIVITY)     EKG     RADIOLOGY I independently reviewed and interpreted imaging and agree with radiologists findings.     PROCEDURES:  Critical Care performed:   Procedures   MEDICATIONS  ORDERED IN ED: Medications  methylPREDNISolone sodium succinate (SOLU-MEDROL) 40 mg/mL injection 40 mg (40 mg Intravenous Given 02/10/23 1451)  ipratropium-albuterol (DUONEB) 0.5-2.5 (3) MG/3ML nebulizer solution 3 mL (3 mLs Nebulization Given 02/10/23 1443)  ipratropium-albuterol (DUONEB) 0.5-2.5 (3) MG/3ML nebulizer solution 3 mL (3 mLs Nebulization Given 02/10/23 1442)     IMPRESSION / MDM / ASSESSMENT AND PLAN / ED COURSE  I  reviewed the triage vital signs and the nursing notes.   Differential diagnosis includes, but is not limited to, asthma exacerbation, bronchitis, pneumonia.  No tachycardia or hypoxia or clinical signs or symptoms of DVT to suggest PE.  No pleurisy.  Patient presents emergency department awake and alert, hemodynamically stable and afebrile.  She has a normal oxygen saturation on room air and demonstrates no increased work of breathing.  No tachypnea, no accessory muscle use, and she is able to speak in very long sentences without respiratory distress.  I reviewed the patient's chart.  She called her pulmonologist today saying that she felt that her asthma flare was getting worse, and she requested an appointment today.  The pulmonologist was unable to get her into the clinic today and suggested that she come into the emergency department.  Patient is quite well-appearing.  She was placed on continuous pulse oximetry and maintained normal oxygen saturations throughout her emergency department stay.  Labs obtained in triage are overall reassuring.  No leukocytosis.  Troponin is negative.  This was obtained in triage, she has no chest pain currently.  She has faint expiratory wheezes in bilateral lung fields.  Chest x-ray obtained is negative for pneumonia.  Patient was treated symptomatically with steroids and DuoNebs with complete resolution of her wheezes and significant improvement of her symptoms.  Patient reports feeling significantly improved and feels ready to be  discharged.  She requested a refill of her Tessalon Perles and her prednisone burst therapy which was provided.  Recommend that she follow-up with her outpatient provider.  We discussed return precautions at length.  Patient and her husband understand and agree with plan.  Patient was discharged in stable condition.  Patient's presentation is most consistent with acute complicated illness / injury requiring diagnostic workup.     FINAL CLINICAL IMPRESSION(S) / ED DIAGNOSES   Final diagnoses:  Viral URI with cough  Mild asthma with acute exacerbation, unspecified whether persistent     Rx / DC Orders   ED Discharge Orders          Ordered    benzonatate (TESSALON PERLES) 100 MG capsule  3 times daily PRN        02/10/23 1601    predniSONE (DELTASONE) 50 MG tablet        02/10/23 1601             Note:  This document was prepared using Dragon voice recognition software and may include unintentional dictation errors.   Keturah Shavers 02/10/23 1621    Georga Hacking, MD 02/11/23 2029

## 2023-02-10 NOTE — Telephone Encounter (Signed)
Patient's spouse called to inform the doctor that she just went to the hospital today.  They are taking tests.  She just wanted to inform the doctor.  CB# [phone]

## 2023-02-10 NOTE — Discharge Instructions (Addendum)
Your x-ray does not reveal pneumonia.  Your blood work is normal.  You were treated with a breathing treatment and steroids with improvement of her symptoms.  Please follow-up with your outpatient provider.  You may take the medications as prescribed.  Please return for any new, worsening, or change in symptoms or other concerns.  It was a pleasure caring for you today.

## 2023-02-10 NOTE — Telephone Encounter (Signed)
Called patient and let her know Dr Silverio Lay recommendations and she verbalized understanding and she states she will get up and go to [place]. Nothing further needed

## 2023-02-10 NOTE — ED Triage Notes (Signed)
Pt to ED sent by pulmonologist for possible PNA. States coughing up green sputum, shob. Pt with RR even and unlabored. NAD noted. Speaking in complete sentences.  States taking antibiotics.

## 2023-02-10 NOTE — Telephone Encounter (Signed)
Pt states in midst of asthma flare is getting worse is afraid it is going into pneumonia. SOB . Was at walk in clinic last week feeling worse. Is asking if possible to get worked in today

## 2023-02-10 NOTE — Telephone Encounter (Signed)
Routing to VS as an FYI 

## 2023-02-11 NOTE — Telephone Encounter (Signed)
Noted.  She has appointment with me on 02/18/23.

## 2023-02-16 ENCOUNTER — Telehealth: Payer: Self-pay

## 2023-02-16 NOTE — Telephone Encounter (Signed)
        Patient  visited [facility] on 02/10/2023  for cough.   Telephone encounter attempt :  1st  A HIPAA compliant voice message was left requesting a return call.  Instructed patient to call back at [phone].   [facility]  [facility] Population Health Community Resource Care Guide   [email or web address]  ?? [phone]   Website: [email or web address]  [email or web address]

## 2023-02-16 NOTE — Telephone Encounter (Signed)
        Patient  visited [facility] on 02/10/2023  for cough.   Telephone encounter attempt :  2nd  A HIPAA compliant voice message was left requesting a return call.  Instructed patient to call back at [phone].   Kateri Balch [facility]  [facility] Population Health Community Resource Care Guide   [email or web address]  ?? [phone]   Website: [email or web address]  [email or web address]

## 2023-02-18 ENCOUNTER — Ambulatory Visit (INDEPENDENT_AMBULATORY_CARE_PROVIDER_SITE_OTHER): Payer: Medicare Other | Admitting: Pulmonary Disease

## 2023-02-18 ENCOUNTER — Encounter: Payer: Self-pay | Admitting: Pulmonary Disease

## 2023-02-18 ENCOUNTER — Ambulatory Visit: Payer: Medicare Other | Admitting: Gastroenterology

## 2023-02-18 ENCOUNTER — Telehealth: Payer: Self-pay

## 2023-02-18 VITALS — BP 124/82 | HR 73 | Temp 98.1°F | Ht 65.0 in | Wt 183.0 lb

## 2023-02-18 DIAGNOSIS — R058 Other specified cough: Secondary | ICD-10-CM | POA: Diagnosis not present

## 2023-02-18 DIAGNOSIS — J4551 Severe persistent asthma with (acute) exacerbation: Secondary | ICD-10-CM | POA: Diagnosis not present

## 2023-02-18 DIAGNOSIS — G4733 Obstructive sleep apnea (adult) (pediatric): Secondary | ICD-10-CM

## 2023-02-18 MED ORDER — BENZONATATE 200 MG PO CAPS: 200.0000 mg | ORAL_CAPSULE | Freq: Three times a day (TID) | ORAL | 1 refills | Status: DC | PRN

## 2023-02-18 MED ORDER — STIOLTO RESPIMAT 2.5-2.5 MCG/ACT IN AERS: 2.0000 | INHALATION_SPRAY | Freq: Every day | RESPIRATORY_TRACT | 5 refills | Status: DC

## 2023-02-18 MED ORDER — AZITHROMYCIN 500 MG PO TABS: 500.0000 mg | ORAL_TABLET | Freq: Every day | ORAL | 0 refills | Status: DC

## 2023-02-18 NOTE — Progress Notes (Signed)
[facility]  Chief Complaint  Patient presents with   Follow-up    Persistent cough    Constitutional:  BP 124/82 (BP Location: Left Arm, Cuff Size: Large)   Pulse 73   Temp 98.1 F (36.7 C)   Ht  (1.651 m)   Wt 183 lb (83 kg)   SpO2 98%   BMI 30.45 kg/m   Past Medical History:  Vit D deficiency, Vertigo, Rosacea, Hypothyroidism, HTN, HLD, GERD, Diverticulosis, Depression, HA, COVID 12 February 2021  Past Surgical History:  She  has a past surgical history that includes Cholecystectomy (2007); Appendectomy (2007); Tubal ligation; Total abdominal hysterectomy (1991); Nasal sinus surgery; Breast enhancement surgery (2006); Cardiovascular stress test (2013); Colonoscopy (08/2012); Esophagogastroduodenoscopy (08/2012); US ECHOCARDIOGRAPHY (09/2009); US ECHOCARDIOGRAPHY (11/2007); sleep study (08/2007); Rectocele repair (12/2008); Colonoscopy (2006); Breast biopsy (Left, 09/27/2009); Hernia repair; bladder tack; tummy tuck; Esophagogastroduodenoscopy (11/02/2008); and Cesarean section.  Brief Summary:  Angela Mendoza is a 72 y.o. female former smoker with OSA and asthma.      Subjective:   She is here with her husband.  She went to an outdoor wedding earlier this month.  She think she caught a respiratory infection and then was exposed to pollen.  She has persistent sinus congestion and cough with yellow sputum.  Went to ER.  CXR was negative for pneumonia.  She was treated with ABx and prednisone.  She tried stopping nasal sprays, but sinus congestion got worse.  She is getting white build up around her lips again since being on prednisone.  No fever, but she has felt warm.    Using CPAP nightly.  No issues with mask fit or pressure setting.  Physical Exam:   Appearance - well kempt   ENMT - no sinus tenderness, no oral exudate, no LAN, Mallampati 3 airway, no stridor  Respiratory - scattered rhonchi b/l, no wheeze  CV - s1s2  regular rate and rhythm, no murmurs  Ext - no clubbing, no edema  Skin - no rashes  Psych - normal mood and affect       Pulmonary testing:  Spirometry 12/12 >> FEV1% 68 RAST 11/15/14 >> IgE 30, cats/mold PFT 02/07/16 >> FEV1 2.32 (92%), FEV1% 76, FEF 25-75% 1.56 (71%), TLC 4.25 (81%), DLCO 121, borderline BD from FEF 25-75 FeNO 12/01/18 >> 7 IgE 12/16/21 >> 23  Chest Imaging:  Coronary CT 04/04/21 >> stable 2 mm nodule LLL, coronary calcium score 0 CT sinus 01/09/22 >> normal CT angio chest 07/18/22 >> mild dependent atelectasis  Sleep Tests:  PSG 09/06/07 >> AHI 19 CPAP 01/17/23 to 02/15/23 >> used on 30 of 30 nights with average 7 hrs 22 min.  Average AHI 0.7 with median CPAP 7 and 95 th percentile CPAP 9 cm H2O  Cardiac Tests:  Echo 01/23/21 >> EF 60 to 65%, grade 2 DD, RVSP 39.1 mmHg, mild MR  Social History:  She  reports that she quit smoking about 45 years ago. Her smoking use included cigarettes. She has a 2.10 pack-year smoking history. She has been exposed to tobacco smoke. She has never used smokeless tobacco. She reports current alcohol use. She reports that she does not use drugs.  Family History:  Her family history includes AAA (abdominal aortic aneurysm) in her paternal uncle; Aortic dissection in her maternal aunt and paternal uncle; Breast cancer in her maternal aunt; COPD in her sister; Cancer in her cousin and paternal aunt; Cancer (age of onset: 64)  in her father; Cancer (age of onset: 8) in her mother; Colon polyps in her sister; Heart attack in her paternal uncle; Heart disease in her paternal uncle; Heart failure in her sister; Hypertension in her sister; Stroke in her paternal uncle and sister.     Assessment/Plan:   Allergic asthma with persistent exacerbation.. - don't think she needs additional prednisone or chest xray at this time - will give her 7 day course of zithromax 500 mg daily - will change from spiriva to stiolto two puffs daily - continue  singulair - prn albuterol - if her symptoms persist, then might need to consider a biologic agent - she gets scripts through [facility] - symbicort stopped due to throat irritation  Recurrent thrush. - from ICS inhalers and prednisone - nystatin doesn't clear her infection; usually needs diflucan - she will take 1 dose of fluconazole today   Upper airway cough with allergic rhinitis. - this seems to be main cause of her cough at present - dysmista caused funny taste - continue allegra during the day - continue flonase daily, azelastine bid, singulair qhs - tessalon 200 mg tid prn - atrovent nasal spray tid prn   Obstructive sleep apnea. - she is compliant with CPAP and reports benefit from therapy - uses [employer] for her DME - current CPAP ordered May 2022 - will change auto CPAP to 4 - 8 cm H2O  Time Spent Involved in Patient Care on Day of Examination:  48 minutes  Follow up:   Patient Instructions  Zithromax 1 pill daily for 7 days.  Stiolto two puffs daily.  Stop using spiriva once you start stiolto.  Fluconazole 1 pill x one dose.  Follow up in 4 weeks in the [place] office.  Medication List:   Allergies as of 02/18/2023       Reactions   Codeine Other (See Comments)   Low blood pressure/nausea   Qvar [beclomethasone] Other (See Comments)   Thrush per patient even with rinsing.    Sulfonamide Derivatives Rash   rash        Medication List        Accurate as of February 18, 2023  2:42 PM. If you have any questions, ask your nurse or doctor.          STOP taking these medications    chlorpheniramine 4 MG tablet Commonly known as: Chlorphen Stopped by: Coralyn Helling, MD   predniSONE 50 MG tablet Commonly known as: DELTASONE Stopped by: Coralyn Helling, MD   Spiriva Respimat 1.25 MCG/ACT Aers Generic drug: Tiotropium Bromide Monohydrate Stopped by: Coralyn Helling, MD       TAKE these medications    albuterol 108 (90 Base) MCG/ACT inhaler Commonly  known as: Proventil HFA Inhale 2 puffs into the lungs every 6 (six) hours as needed.   ALPRAZolam 0.25 MG tablet Commonly known as: XANAX TAKE 1 TABLET (0.25 MG TOTAL) BY MOUTH ONCE DAILY AS NEEDED FOR SLEEP.   AMBULATORY NON FORMULARY MEDICATION Medication Name: Diltiazem 2%/Lidocaine 2%   Using your index finger apply a small amount of medication inside the anal opening and to the external anal area twice daily x 6 weeks.   atorvastatin 40 MG tablet Commonly known as: LIPITOR Take 1 tablet (40 mg total) by mouth daily.   azelastine 0.1 % nasal spray Commonly known as: ASTELIN Place 2 sprays into both nostrils 2 (two) times daily. Use in each nostril as directed   azithromycin 500 MG tablet Commonly known  as: Zithromax Take 1 tablet (500 mg total) by mouth daily. Started by: Niajah Sipos SoCoralyn Helling benzonatate 200 MG capsule Commonly known as: TESSALON Take 1 capsule (200 mg total) by mouth 3 (three) times daily as needed for cough. What changed: Another medication with the same name was removed. Continue taking this medication, and follow the directions you see here. Changed by: Coralyn Helling, MD   COQ10 PO Take by mouth daily.   cyanocobalamin 1000 MCG/ML injection Commonly known as: VITAMIN B12 Inject 1 mL (1,000 mcg total) into the muscle every 30 (thirty) days.   cycloSPORINE 0.05 % ophthalmic emulsion Commonly known as: RESTASIS Place 1 drop into both eyes 2 (two) times daily.   desonide 0.05 % cream Commonly known as: DESOWEN Apply topically.   diazepam 5 MG tablet Commonly known as: VALIUM Take 1 tablet (5 mg total) by mouth every 6 (six) hours as needed for anxiety. What changed: Another medication with the same name was removed. Continue taking this medication, and follow the directions you see here. Changed by: Coralyn Helling, MD   EPINEPHrine 0.3 mg/0.3 mL Soaj injection Commonly known as: EPI-PEN 0.3 mg by injection route.   estradiol 0.1 MG/24HR  patch Commonly known as: VIVELLE-DOT Place 1 patch (0.1 mg total) onto the skin 2 (two) times a week.   famotidine 20 MG tablet Commonly known as: PEPCID Take 1 tablet (20 mg total) by mouth daily as needed for heartburn or indigestion.   fexofenadine 180 MG tablet Commonly known as: Allegra Allergy Take 1 tablet (180 mg total) by mouth daily.   fluticasone 50 MCG/ACT nasal spray Commonly known as: FLONASE Place 1 spray into both nostrils daily.   hydrocortisone-pramoxine rectal foam Commonly known as: PROCTOFOAM-HC Place 1 applicator rectally 2 (two) times daily.   ipratropium 0.03 % nasal spray Commonly known as: ATROVENT Place 2 sprays into both nostrils 3 (three) times daily as needed for rhinitis.   levothyroxine 50 MCG tablet Commonly known as: Synthroid Take 1 tablet (50 mcg total) by mouth daily before breakfast.   losartan 50 MG tablet Commonly known as: COZAAR TAKE 1 TABLET BY MOUTH EVERYDAY AT BEDTIME   MAGNESIUM PO Take 250 mg by mouth daily.   meclizine 25 MG tablet Commonly known as: ANTIVERT Take 1 tablet (25 mg total) by mouth 3 (three) times daily as needed (vertigo).   meloxicam 15 MG tablet Commonly known as: MOBIC Take 15 mg by mouth daily.   metoprolol succinate 50 MG 24 hr tablet Commonly known as: TOPROL-XL TAKE 1 TABLET BY MOUTH EVERY DAY   montelukast 10 MG tablet Commonly known as: SINGULAIR Take 1 tablet (10 mg total) by mouth at bedtime.   multivitamin tablet Take 1 tablet by mouth daily.   mupirocin ointment 2 % Commonly known as: BACTROBAN Apply topically.   ondansetron 4 MG disintegrating tablet Commonly known as: Zofran ODT Take 1 tablet (4 mg total) by mouth every 8 (eight) hours as needed for nausea or vomiting.   OneTouch Delica Lancets 33G Misc Used to check blood sugars twice daly.   OneTouch Verio High Soln Used to check accuracy of blood glucose machine.   OneTouch Verio test strip Generic drug: glucose  blood Used to check blood sugars 3 times daily.   Premarin vaginal cream Generic drug: conjugated estrogens Place vaginally 2 (two) times a week. INSERT 0.5 APPLICATORS FULL VAGINALLY TWICE PER WEEK. AT BEDTIME.   Propylene Glycol 0.6 % Soln Apply 1 drop to eye as  needed.   psyllium 58.6 % powder Commonly known as: METAMUCIL Take 1 packet by mouth as needed.   Stiolto Respimat 2.5-2.5 MCG/ACT Aers Generic drug: Tiotropium Bromide-Olodaterol Inhale 2 puffs into the lungs daily. Started by: Coralyn Helling, MD   STOOL SOFTENER PO Take 1 tablet by mouth as needed.   SYRINGE 3CC/20GX1" 20G X 1" 3 ML Misc 1 Syringe by Does not apply route every 30 (thirty) days. For use with b-12 injection monthly   tretinoin 0.05 % cream Commonly known as: RETIN-A Apply topically at bedtime.   urea 40 % Crea Commonly known as: CARMOL Apply topically.   Vitamin D3 50 MCG (2000 UT) Tabs Take 1 tablet by mouth daily.        Signature:  Coralyn Helling, MD [facility] Pulmonary/Critical Care Pager - [phone] 02/18/2023, 2:42 PM

## 2023-02-18 NOTE — Telephone Encounter (Signed)
The patient was seen in the office today. When she was leaving she asked if Dr. Craige Cotta would send in a 90 day supply of Stiolto to her mail order pharamcy [facility] [place]).  Per Dr. Craige Cotta, he sent in a 30 day supply to her local pharmacy for her to try. If she uses it for a few weeks and it is working then we can send her in a 90 day supply.  ATC the patient. LVM for the patient to return my call.

## 2023-02-18 NOTE — Telephone Encounter (Signed)
Pt called the office back. I let her know the info per Dr. Craige Cotta and she verbalized understanding. Nothing further needed.

## 2023-02-18 NOTE — Patient Instructions (Signed)
Zithromax 1 pill daily for 7 days.  Stiolto two puffs daily.  Stop using spiriva once you start stiolto.  Fluconazole 1 pill x one dose.  Follow up in 4 weeks in the [place] office.

## 2023-02-19 ENCOUNTER — Telehealth: Payer: Self-pay | Admitting: Pulmonary Disease

## 2023-02-19 NOTE — Telephone Encounter (Signed)
Patient would like to discuss Stiolto inhaler. Patient phone number is [phone].

## 2023-02-20 NOTE — Telephone Encounter (Signed)
Patient is returning a call regarding her medication.  Please call back at [phone]

## 2023-02-20 NOTE — Telephone Encounter (Signed)
Please advise PT stopped taking stiolto due to racing heart felt like she was about to die. Also said stiolto is not reccommended for ppl with asthma and also shouldn't take if person takes metoprolol.

## 2023-02-20 NOTE — Telephone Encounter (Signed)
Lvm to call back with questions about stiolto

## 2023-02-23 MED ORDER — SPIRIVA RESPIMAT 2.5 MCG/ACT IN AERS: 2.0000 | INHALATION_SPRAY | Freq: Every day | RESPIRATORY_TRACT | Status: DC

## 2023-02-23 NOTE — Telephone Encounter (Signed)
Tried call back to discuss her concerns, but no answer.  Please let her know that we can use different medications "off-label" since there are bioequivalent medications that have indication for different conditions.  So, even though stiolto isn't indicated for the treatment of asthma, there are several bioequivalent inhalers that have asthma as an indication.  Since stiolto has tiotripium as one of the medicines in it and she had been on spiriva (which is tiotropium), that is the reason I opted for stiolto instead of another inhaler.    Also it is not an absolute restriction to using stiolto and metoprolol at the same time.  These types of medications are commonly used together in patients with asthma and high pressure or heart disease, and most individuals tolerate these combinations of medications without difficulty.  The extra medication that is part of stiolto (which is a long acting beta agonist olodaterol) but not part of spiriva can cause her heart to feel like it is racing.  Since this was a significant side effect, then I recommend resuming spiriva and not continuing with stiolto.

## 2023-02-25 NOTE — Telephone Encounter (Signed)
Spoke with patient she advises she has continued to use the Spiriva since the Stiolto caused her heart to race. She states Spiriva is working well for her and would like to continue using. She advises the coughing has improved and overall feeling slightly better. Routing to Dr. Craige Cotta as an [place] [facility]

## 2023-02-25 NOTE — Telephone Encounter (Signed)
Noted  

## 2023-03-05 ENCOUNTER — Ambulatory Visit: Payer: Medicare Other | Admitting: Physical Therapy

## 2023-03-18 ENCOUNTER — Encounter: Payer: Medicare Other | Admitting: Physical Therapy

## 2023-03-18 ENCOUNTER — Encounter: Payer: Self-pay | Admitting: Pulmonary Disease

## 2023-03-18 ENCOUNTER — Ambulatory Visit (INDEPENDENT_AMBULATORY_CARE_PROVIDER_SITE_OTHER): Payer: Medicare Other | Admitting: Pulmonary Disease

## 2023-03-18 DIAGNOSIS — J019 Acute sinusitis, unspecified: Secondary | ICD-10-CM

## 2023-03-18 MED ORDER — FLUTICASONE PROPIONATE 50 MCG/ACT NA SUSP: 1.0000 | Freq: Every day | NASAL | 3 refills | Status: DC

## 2023-03-18 MED ORDER — CHLORPHENIRAMINE MALEATE 4 MG PO TABS: 4.0000 mg | ORAL_TABLET | Freq: Every evening | ORAL | 1 refills | Status: DC | PRN

## 2023-03-18 MED ORDER — AZELASTINE HCL 0.1 % NA SOLN
2.0000 | Freq: Two times a day (BID) | NASAL | 3 refills | Status: DC
Start: 2023-03-18 — End: 2023-06-18

## 2023-03-18 MED ORDER — AEROCHAMBER MV MISC: 0 refills | Status: AC

## 2023-03-18 MED ORDER — MONTELUKAST SODIUM 10 MG PO TABS: 10.0000 mg | ORAL_TABLET | Freq: Every day | ORAL | 3 refills | Status: DC

## 2023-03-18 MED ORDER — SPIRIVA RESPIMAT 1.25 MCG/ACT IN AERS: 2.0000 | INHALATION_SPRAY | Freq: Every day | RESPIRATORY_TRACT | 3 refills | Status: DC

## 2023-03-18 NOTE — Patient Instructions (Signed)
Follow up in 4 months 

## 2023-03-18 NOTE — Progress Notes (Signed)
[facility]  Chief Complaint  Patient presents with   Follow-up    Wearing cpap nightly- pressure and mask okay. No concerns.     Constitutional:  BP 138/80 (BP Location: Left Arm, Cuff Size: Normal)   Pulse 76   Temp 97.8 F (36.6 C) (Temporal)   Ht 5\' 5"  (1.651 m)   Wt 181 lb 9.6 oz (82.4 kg)   SpO2 97%   BMI 30.22 kg/m   Past Medical History:  Vit D deficiency, Vertigo, Rosacea, Hypothyroidism, HTN, HLD, GERD, Diverticulosis, Depression, HA, COVID 12 February 2021  Past Surgical History:  She  has a past surgical history that includes Cholecystectomy (2007); Appendectomy (2007); Tubal ligation; Total abdominal hysterectomy (1991); Nasal sinus surgery; Breast enhancement surgery (2006); Cardiovascular stress test (2013); Colonoscopy (08/2012); Esophagogastroduodenoscopy (08/2012); US ECHOCARDIOGRAPHY (09/2009); US ECHOCARDIOGRAPHY (11/2007); sleep study (08/2007); Rectocele repair (12/2008); Colonoscopy (2006); Breast biopsy (Left, 09/27/2009); Hernia repair; bladder tack; tummy tuck; Esophagogastroduodenoscopy (11/02/2008); and Cesarean section.  Brief Summary:  Angela Mendoza is a 72 y.o. female former smoker with OSA and asthma.      Subjective:   She wasn't able to tolerate stiolto.  Made her heart race.  Breathing is much better now after course of zithromax.  Using spiriva again.  Not having sinus congestion, sore throat, wheeze, or much cough.  Uses CPAP nightly.  No issues with mask fit or pressure setting.  Physical Exam:   Appearance - well kempt   ENMT - no sinus tenderness, no oral exudate, no LAN, Mallampati 3 airway, no stridor  Respiratory - equal breath sounds bilaterally, no wheezing or rales  CV - s1s2 regular rate and rhythm, no murmurs  Ext - no clubbing, no edema  Skin - no rashes  Psych - normal mood and affect        Pulmonary testing:  Spirometry 12/12 >> FEV1% 68 RAST 11/15/14 >> IgE 30,  cats/mold PFT 02/07/16 >> FEV1 2.32 (92%), FEV1% 76, FEF 25-75% 1.56 (71%), TLC 4.25 (81%), DLCO 121, borderline BD from FEF 25-75 FeNO 12/01/18 >> 7 IgE 12/16/21 >> 23  Chest Imaging:  Coronary CT 04/04/21 >> stable 2 mm nodule LLL, coronary calcium score 0 CT sinus 01/09/22 >> normal CT angio chest 07/18/22 >> mild dependent atelectasis  Sleep Tests:  PSG 09/06/07 >> AHI 19 CPAP 02/14/23 to 03/15/23 >> used on 30 of 30 nights with average 7 hrs 43 min.  Average AHI 0.8 with median CPAP 7 and 95 th percentile CPAP 9 cm H2O  Cardiac Tests:  Echo 01/23/21 >> EF 60 to 65%, grade 2 DD, RVSP 39.1 mmHg, mild MR  Social History:  She  reports that she quit smoking about 45 years ago. Her smoking use included cigarettes. She has a 2.10 pack-year smoking history. She has been exposed to tobacco smoke. She has never used smokeless tobacco. She reports current alcohol use. She reports that she does not use drugs.  Family History:  Her family history includes AAA (abdominal aortic aneurysm) in her paternal uncle; Aortic dissection in her maternal aunt and paternal uncle; Breast cancer in her maternal aunt; COPD in her sister; Cancer in her cousin and paternal aunt; Cancer (age of onset: 57) in her father; Cancer (age of onset: 11) in her mother; Colon polyps in her sister; Heart attack in her paternal uncle; Heart disease in her paternal uncle; Heart failure in her sister; Hypertension in her sister; Stroke in her paternal uncle and sister.  Assessment/Plan:   Allergic asthma. - LABA's cause palpitations, and ICS's cause thrush - continue spiriva 1.25 two puffs daily, singulair 10 mg nightly - prn albuterol - provider her with a spacer device - she gets scripts through [facility], 90 day supply  Recurrent thrush. - from ICS inhalers and prednisone - nystatin doesn't clear her infection; usually needs diflucan   Upper airway cough with allergic rhinitis. - dysmista caused funny taste - continue  allegra, azelastine, and singulair - prn chlorpheniramine, tessalon, atrovent nasal spray   Obstructive sleep apnea. - she is compliant with CPAP and reports benefit from therapy - uses [employer] for her DME - current CPAP ordered May 2022 - continue auto CPAP 5 to 10 cm H2O  Time Spent Involved in Patient Care on Day of Examination:  27 minutes  Follow up:   Patient Instructions  Follow up in 4 months  Medication List:   Allergies as of 03/18/2023       Reactions   Codeine Other (See Comments)   Low blood pressure/nausea   Olodaterol Palpitations   Qvar [beclomethasone] Other (See Comments)   Thrush per patient even with rinsing.    Sulfonamide Derivatives Rash   rash        Medication List        Accurate as of Mar 18, 2023 10:30 AM. If you have any questions, ask your nurse or doctor.          STOP taking these medications    azithromycin 500 MG tablet Commonly known as: Zithromax Stopped by: Coralyn Helling, MD   hydrocortisone-pramoxine rectal foam Commonly known as: PROCTOFOAM-HC Stopped by: Coralyn Helling, MD       TAKE these medications    albuterol 108 (90 Base) MCG/ACT inhaler Commonly known as: Proventil HFA Inhale 2 puffs into the lungs every 6 (six) hours as needed.   ALPRAZolam 0.25 MG tablet Commonly known as: XANAX TAKE 1 TABLET (0.25 MG TOTAL) BY MOUTH ONCE DAILY AS NEEDED FOR SLEEP.   AMBULATORY NON FORMULARY MEDICATION Medication Name: Diltiazem 2%/Lidocaine 2%   Using your index finger apply a small amount of medication inside the anal opening and to the external anal area twice daily x 6 weeks.   atorvastatin 40 MG tablet Commonly known as: LIPITOR Take 1 tablet (40 mg total) by mouth daily.   azelastine 0.1 % nasal spray Commonly known as: ASTELIN Place 2 sprays into both nostrils 2 (two) times daily. Use in each nostril as directed   benzonatate 200 MG capsule Commonly known as: TESSALON Take 1 capsule (200 mg total) by mouth  3 (three) times daily as needed for cough.   chlorpheniramine 4 MG tablet Commonly known as: CHLOR-TRIMETON Take 1 tablet (4 mg total) by mouth at bedtime as needed for allergies or rhinitis. Started by: Coralyn Helling, MD   COQ10 PO Take by mouth daily.   cyanocobalamin 1000 MCG/ML injection Commonly known as: VITAMIN B12 Inject 1 mL (1,000 mcg total) into the muscle every 30 (thirty) days.   cycloSPORINE 0.05 % ophthalmic emulsion Commonly known as: RESTASIS Place 1 drop into both eyes 2 (two) times daily.   desonide 0.05 % cream Commonly known as: DESOWEN Apply topically as needed.   diazepam 5 MG tablet Commonly known as: VALIUM Take 1 tablet (5 mg total) by mouth every 6 (six) hours as needed for anxiety.   EPINEPHrine 0.3 mg/0.3 mL Soaj injection Commonly known as: EPI-PEN 0.3 mg by injection route.   estradiol 0.1  MG/24HR patch Commonly known as: VIVELLE-DOT Place 1 patch (0.1 mg total) onto the skin 2 (two) times a week.   famotidine 20 MG tablet Commonly known as: PEPCID Take 1 tablet (20 mg total) by mouth daily as needed for heartburn or indigestion.   fexofenadine 180 MG tablet Commonly known as: Allegra Allergy Take 1 tablet (180 mg total) by mouth daily.   fluticasone 50 MCG/ACT nasal spray Commonly known as: FLONASE Place 1 spray into both nostrils daily.   ipratropium 0.03 % nasal spray Commonly known as: ATROVENT Place 2 sprays into both nostrils 3 (three) times daily as needed for rhinitis.   levothyroxine 50 MCG tablet Commonly known as: Synthroid Take 1 tablet (50 mcg total) by mouth daily before breakfast.   losartan 50 MG tablet Commonly known as: COZAAR TAKE 1 TABLET BY MOUTH EVERYDAY AT BEDTIME   MAGNESIUM PO Take 250 mg by mouth daily.   meclizine 25 MG tablet Commonly known as: ANTIVERT Take 1 tablet (25 mg total) by mouth 3 (three) times daily as needed (vertigo).   meloxicam 15 MG tablet Commonly known as: MOBIC Take 15 mg by  mouth daily.   metoprolol succinate 50 MG 24 hr tablet Commonly known as: TOPROL-XL TAKE 1 TABLET BY MOUTH EVERY DAY   montelukast 10 MG tablet Commonly known as: SINGULAIR Take 1 tablet (10 mg total) by mouth at bedtime.   multivitamin tablet Take 1 tablet by mouth daily.   mupirocin ointment 2 % Commonly known as: BACTROBAN Apply topically.   ondansetron 4 MG disintegrating tablet Commonly known as: Zofran ODT Take 1 tablet (4 mg total) by mouth every 8 (eight) hours as needed for nausea or vomiting.   OneTouch Delica Lancets 33G Misc Used to check blood sugars twice daly.   OneTouch Verio High Soln Used to check accuracy of blood glucose machine.   OneTouch Verio test strip Generic drug: glucose blood Used to check blood sugars 3 times daily.   Premarin vaginal cream Generic drug: conjugated estrogens Place vaginally 2 (two) times a week. INSERT 0.5 APPLICATORS FULL VAGINALLY TWICE PER WEEK. AT BEDTIME.   Propylene Glycol 0.6 % Soln Apply 1 drop to eye as needed.   psyllium 58.6 % powder Commonly known as: METAMUCIL Take 1 packet by mouth as needed.   Spiriva Respimat 1.25 MCG/ACT Aers Generic drug: Tiotropium Bromide Monohydrate Inhale 2 each into the lungs daily. What changed: Another medication with the same name was removed. Continue taking this medication, and follow the directions you see here. Changed by: Coralyn Helling, MD   STOOL SOFTENER PO Take 1 tablet by mouth as needed.   SYRINGE 3CC/20GX1" 20G X 1" 3 ML Misc 1 Syringe by Does not apply route every 30 (thirty) days. For use with b-12 injection monthly   tretinoin 0.05 % cream Commonly known as: RETIN-A Apply topically at bedtime.   urea 40 % Crea Commonly known as: CARMOL Apply topically.   Vitamin D3 50 MCG (2000 UT) Tabs Take 1 tablet by mouth daily.        Signature:  Coralyn Helling, MD [facility] Pulmonary/Critical Care Pager - [phone] 03/18/2023, 10:30 AM

## 2023-03-30 DIAGNOSIS — H16223 Keratoconjunctivitis sicca, not specified as Sjogren's, bilateral: Secondary | ICD-10-CM | POA: Diagnosis not present

## 2023-03-30 DIAGNOSIS — H2513 Age-related nuclear cataract, bilateral: Secondary | ICD-10-CM | POA: Diagnosis not present

## 2023-04-14 ENCOUNTER — Ambulatory Visit (INDEPENDENT_AMBULATORY_CARE_PROVIDER_SITE_OTHER): Payer: Medicare Other | Admitting: Gastroenterology

## 2023-04-14 ENCOUNTER — Encounter: Payer: Self-pay | Admitting: Gastroenterology

## 2023-04-14 ENCOUNTER — Other Ambulatory Visit (INDEPENDENT_AMBULATORY_CARE_PROVIDER_SITE_OTHER): Payer: Medicare Other

## 2023-04-14 VITALS — BP 128/72 | HR 72 | Ht 65.0 in | Wt 184.6 lb

## 2023-04-14 DIAGNOSIS — K649 Unspecified hemorrhoids: Secondary | ICD-10-CM

## 2023-04-14 DIAGNOSIS — R1012 Left upper quadrant pain: Secondary | ICD-10-CM | POA: Diagnosis not present

## 2023-04-14 DIAGNOSIS — K625 Hemorrhage of anus and rectum: Secondary | ICD-10-CM | POA: Diagnosis not present

## 2023-04-14 LAB — COMPREHENSIVE METABOLIC PANEL
ALT: 21 U/L (ref 0–35)
AST: 22 U/L (ref 0–37)
Albumin: 4.4 g/dL (ref 3.5–5.2)
Alkaline Phosphatase: 64 U/L (ref 39–117)
BUN: 9 mg/dL (ref 6–23)
CO2: 27 mEq/L (ref 19–32)
Calcium: 9.2 mg/dL (ref 8.4–10.5)
Chloride: 102 mEq/L (ref 96–112)
Creatinine, Ser: 0.82 mg/dL (ref 0.40–1.20)
GFR: 71.47 mL/min (ref >60.00)
Glucose, Bld: 99 mg/dL (ref 70–99)
Potassium: 4 mEq/L (ref 3.5–5.1)
Sodium: 136 mEq/L (ref 135–145)
Total Bilirubin: 0.8 mg/dL (ref 0.2–1.2)
Total Protein: 7.7 g/dL (ref 6.0–8.3)

## 2023-04-14 LAB — CBC WITH DIFFERENTIAL/PLATELET
Basophils Absolute: 0 10*3/uL (ref 0.0–0.1)
Basophils Relative: 0.3 % (ref 0.0–3.0)
Eosinophils Absolute: 0.1 10*3/uL (ref 0.0–0.7)
Eosinophils Relative: 1.7 % (ref 0.0–5.0)
HCT: 37.4 % (ref 36.0–46.0)
Hemoglobin: 12.5 g/dL (ref 12.0–15.0)
Lymphocytes Relative: 42.8 % (ref 12.0–46.0)
Lymphs Abs: 2.6 10*3/uL (ref 0.7–4.0)
MCHC: 33.3 g/dL (ref 30.0–36.0)
MCV: 92.9 fl (ref 78.0–100.0)
Monocytes Absolute: 0.6 10*3/uL (ref 0.1–1.0)
Monocytes Relative: 9.8 % (ref 3.0–12.0)
Neutro Abs: 2.8 10*3/uL (ref 1.4–7.7)
Neutrophils Relative %: 45.4 % (ref 43.0–77.0)
Platelets: 321 10*3/uL (ref 150.0–400.0)
RBC: 4.02 Mil/uL (ref 3.87–5.11)
RDW: 13.3 % (ref 11.5–15.5)
WBC: 6.1 10*3/uL (ref 4.0–10.5)

## 2023-04-14 LAB — LIPASE: Lipase: 24 U/L (ref 11.0–59.0)

## 2023-04-14 MED ORDER — HYDROCORTISONE (PERIANAL) 2.5 % EX CREA: 1.0000 | TOPICAL_CREAM | Freq: Two times a day (BID) | CUTANEOUS | 4 refills | Status: DC

## 2023-04-14 NOTE — Patient Instructions (Addendum)
_______________________________________________________  If your blood pressure at your visit was 140/90 or greater, please contact your primary care physician to follow up on this.  _______________________________________________________  If you are age 72 or older, your body mass index should be between 23-30. Your Body mass index is 30.72 kg/m. If this is out of the aforementioned range listed, please consider follow up with your Primary Care Provider.  If you are age 56 or younger, your body mass index should be between 19-25. Your Body mass index is 30.72 kg/m. If this is out of the aformentioned range listed, please consider follow up with your Primary Care Provider.   ________________________________________________________  The [place] [facility] providers would like to encourage you to use [facility] to communicate with providers for non-urgent requests or questions.  Due to long hold times on the telephone, sending your provider a message by [facility] may be a faster and more efficient way to get a response.  Please allow 48 business hours for a response.  Please remember that this is for non-urgent requests.  _______________________________________________________  Your provider has requested that you go to the basement level for lab work before leaving today. Press "B" on the elevator. The lab is located at the first door on the left as you exit the elevator.  We have sent the following medications to your pharmacy for you to pick up at your convenience: Hydrocortisone cream    Continue align  Please purchase the following medications over the counter and take as directed: Benefiber 1 tablespoon daily. Can start with miralax 1 capful daily if with constipation  You have been scheduled for an appointment with Dr. Chales Abrahams on 07-09-2023 at 930am. Please arrive 10 minutes early for your appointment.   You have been scheduled for a CT scan of the abdomen and pelvis at [facility] on  04-16-2023 at 3pm   WARNING: IF YOU ARE ALLERGIC TO IODINE/X-RAY DYE, PLEASE NOTIFY RADIOLOGY IMMEDIATELY AT [phone]! YOU WILL BE GIVEN A 13 HOUR PREMEDICATION PREP.  1) Do not eat or drink anything after 11am (4 hours prior to your test) 2) You will be given 2 bottles of oral contrast to drink on site. The solution may taste better if refrigerated, but do NOT add ice or any other liquid to this solution. Shake well before drinking.    Drink 1 bottle of contrast @ 1pm (2 hours prior to your exam)  Drink 1 bottle of contrast @ 2pm (1 hour prior to your exam)  You may take any medications as prescribed with a small amount of water, if necessary. If you take any of the following medications: METFORMIN, GLUCOPHAGE, GLUCOVANCE, AVANDAMET, RIOMET, FORTAMET, ACTOPLUS MET, JANUMET, GLUMETZA or METAGLIP, you MAY be asked to HOLD this medication 48 hours AFTER the exam.  The purpose of you drinking the oral contrast is to aid in the visualization of your intestinal tract. The contrast solution may cause some diarrhea. Depending on your individual set of symptoms, you may also receive an intravenous injection of x-ray contrast/dye. Plan on being there for 30 minutes or longer, depending on the type of exam you are having performed.  This test typically takes 30-45 minutes to complete.  If you have any questions regarding your exam or if you need to reschedule, you may call the CT department at [phone] between the hours of 8:00 am and 5:00 pm, Monday-Friday.  ________________________________________________________________________  Thank you,  Dr. Lynann Bologna

## 2023-04-14 NOTE — Progress Notes (Signed)
Chief Complaint: FU  Referring Provider:  Sherlene Shams, MD      ASSESSMENT AND PLAN;   Intermittent constipation. H/O rectal bleeding d/t internal hemorrhoids and rectal fissure worsened after recent bladder sling surgery at [facility] Neg colon 07/2022 except for nonbleeding internal hemorrhoids, 1 diminutive TA polyp  2. Abdo pain LUQ  3. H/O tubular adenoma on colon 07/2022. No recall d/t age  72. GERD-well-controlled with Pepcid/lifestyle changes.  Plan: -CBC, CMP, lipase -CT AP with contrast -Benefiber 1TBS po every day. Add miralax if still with constipation. -Drink plenty of water. -Continue align 1 tab po every day -HC cream 2.5 % BID x 10 days. 4RF -FU 6 months, sooner if problems or abn CT   HPI:    Angela Mendoza is a 72 y.o. female  With multiple medical problems as listed below  For FU (see last note dated 01/27/2023 by Quentin Mulling PA) Patient overall feels better Continues to have problems with constipation with occasional pellet-like stools and straining. Bms 2-3/week She has to lean back to have bowel movements. She denies having any melena.  She was given trial of Cardizem cream for anal fissure which is improved. She continues to have occasional rectal bleeding attributed to hemorrhoids.  Also with LUQ Abdo pain, bloating.  The pain does not radiate.  Occasionally exacerbated with eating.  Does not improve with defecation.  She denies having any upper GI symptoms including nausea, vomiting, dysphagia or odynophagia.  No recent weight loss.  Her reflux is under good control with lifestyle changes and as needed Pepcid.  Very much concerned regarding cancers-as her best friend had anal cancer    Past GI workup: EGD 08/22/2022 - Congested mucosa in the esophagus. - Gastritis. Biopsied - Multiple gastric polyps. Bx-fundic gland polyps - Bx: neg eso Bx, neg HP.   Colonoscopy 08/22/2022 -Diminutive colonic polyps s/p polypectomy. Bx-  TA -Pancolonic diverticulosis -Internal hemorrhoids -No need to repeat d/t age -Previous colonoscopy by Dr. Georgiana Shore 08/2012 neg   Previous endoscopic procedures under procedure tab.  Past Medical History:  Diagnosis Date   Agatston coronary artery calcium score less than 100    a. 06/2018 Cardiac CT: Ca2+ = 3 (49th %'ile).   Allergic rhinitis 06/09/2008   chronic   Allergy    Aortic atherosclerosis (HCC)    a. 06/2018 noted on chest CT.   Arthritis    Asthma, intrinsic 10/06/2011   controlled on qvar. Cleda Daub 09/2011:  Very mild airflow obstruction (FEV1% 68, FVL c/w obstruction)    Basal cell carcinoma 06/10/2021   right prox nasal ala rim - MOHs 07/12/21 Dr. Adriana Simas   Cataract    Chronic headache 06/09/2008   COPD (chronic obstructive pulmonary disease) (HCC)    Depression    Diverticulosis 2013   h/o diverticulitis   Dry eyes    GERD (gastroesophageal reflux disease)    Heart murmur    History of anal fissures    x2   History of breast implant removal    silicone mastitis - R axilla silicone LN, free silicone L breast upper outer quadrant   Hot flashes    HYPERLIPIDEMIA 06/09/2008   HYPERTENSION 06/09/2008   Hypothyroidism    MVP (mitral valve prolapse)    OSA on CPAP    [name]   PAF (paroxysmal atrial fibrillation) (HCC) 06/09/2008   a. CHA2DS2VASc = 3-->prefers ASA.   Pernicious anemia    per prior pcp   Rosacea    Sleep apnea  Surgical menopause 1991   Vertigo    Vitamin D deficiency     Past Surgical History:  Procedure Laterality Date   APPENDECTOMY  2007   bladder tack     BREAST BIOPSY Left 09/27/2009   BREAST ENHANCEMENT SURGERY  2006   and removal   CARDIOVASCULAR STRESS TEST  2013   [place] Dr. Dulce Sellar - WNL, EF 55-60%, with 0 calcium score   CESAREAN SECTION     CHOLECYSTECTOMY  2007   biliary dyskinesia   COLONOSCOPY  08/2012   diverticulosis   COLONOSCOPY  2006   inflamed hyperplastic polyp   ESOPHAGOGASTRODUODENOSCOPY  08/2012    esophageal reflux   ESOPHAGOGASTRODUODENOSCOPY  11/02/2008   Dr Chales Abrahams Minimal gastritis. Irregular Z line suggestive of gastroesophageal reflux (biopsied to rule out short-segment Barrett's esophagus) Incidental gastric polyps   HERNIA REPAIR     NASAL SINUS SURGERY     RECTOCELE REPAIR  12/2008   Dr. Thamas Jaegers   sleep study  08/2007   OSA, 12 cm H2O,   TOTAL ABDOMINAL HYSTERECTOMY  1991   heavy bleeding, ovaries removed   TUBAL LIGATION     tummy tuck     US ECHOCARDIOGRAPHY  09/2009   impaired relaxation, mild tric regurg, normal MV, EF 60%   US ECHOCARDIOGRAPHY  11/2007   mild-mod MR    Family History  Problem Relation Age of Onset   Cancer Mother 36       lung, passive smoke   Cancer Father 58       lung, smoker   Colon polyps Sister    COPD Sister    Stroke Sister        TIA   Hypertension Sister    Heart failure Sister    Aortic dissection Maternal Aunt    Breast cancer Maternal Aunt    Cancer Paternal Aunt        breast, lung   Aortic dissection Paternal Uncle    Heart attack Paternal Uncle    AAA (abdominal aortic aneurysm) Paternal Uncle    Heart disease Paternal Uncle    Stroke Paternal Uncle    Cancer Cousin        breast   Colon cancer Neg Hx    Rectal cancer Neg Hx    Stomach cancer Neg Hx    Esophageal cancer Neg Hx     Social History   Tobacco Use   Smoking status: Former    Packs/day: 0.30    Years: 7.00    Additional pack years: 0.00    Total pack years: 2.10    Types: Cigarettes    Quit date: 10/27/1977    Years since quitting: 45.4    Passive exposure: Past   Smokeless tobacco: Never   Tobacco comments:    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 Hx of children who are grown  Vaping Use   Vaping Use: Never used  Substance Use Topics   Alcohol use: Not Currently    Comment: occ   Drug use: No    Current Outpatient Medications  Medication Sig Dispense Refill   albuterol (PROVENTIL HFA) 108 (90 Base) MCG/ACT inhaler  Inhale 2 puffs into the lungs every 6 (six) hours as needed. 18 g 2   ALPRAZolam (XANAX) 0.25 MG tablet TAKE 1 TABLET (0.25 MG TOTAL) BY MOUTH ONCE DAILY AS NEEDED FOR SLEEP. 30 tablet 5   AMBULATORY NON FORMULARY MEDICATION Medication Name: Diltiazem 2%/Lidocaine 2%   Using your index finger  apply a small amount of medication inside the anal opening and to the external anal area twice daily x 6 weeks. 30 g 1   atorvastatin (LIPITOR) 40 MG tablet Take 1 tablet (40 mg total) by mouth daily. 90 tablet 3   azelastine (ASTELIN) 0.1 % nasal spray Place 2 sprays into both nostrils 2 (two) times daily. Use in each nostril as directed 90 mL 3   benzonatate (TESSALON) 200 MG capsule Take 1 capsule (200 mg total) by mouth 3 (three) times daily as needed for cough. 30 capsule 1   Blood Glucose Calibration (ONETOUCH VERIO) High SOLN Used to check accuracy of blood glucose machine. 1 each 0   chlorpheniramine (CHLOR-TRIMETON) 4 MG tablet Take 1 tablet (4 mg total) by mouth at bedtime as needed for allergies or rhinitis. 90 tablet 1   Cholecalciferol (VITAMIN D3) 50 MCG (2000 UT) TABS Take 1 tablet by mouth daily. 90 tablet 3   Coenzyme Q10 (COQ10 PO) Take by mouth daily.     conjugated estrogens (PREMARIN) vaginal cream Place vaginally 2 (two) times a week. INSERT 0.5 APPLICATORS FULL VAGINALLY TWICE PER WEEK. AT BEDTIME. 30 g 4   cyanocobalamin (VITAMIN B12) 1000 MCG/ML injection Inject 1 mL (1,000 mcg total) into the muscle every 30 (thirty) days. 3 mL 3   cycloSPORINE (RESTASIS) 0.05 % ophthalmic emulsion Place 1 drop into both eyes 2 (two) times daily. 3 each 3   desonide (DESOWEN) 0.05 % cream Apply topically as needed.     diazepam (VALIUM) 5 MG tablet Take 1 tablet (5 mg total) by mouth every 6 (six) hours as needed for anxiety. 30 tablet 0   Docusate Calcium (STOOL SOFTENER PO) Take 1 tablet by mouth as needed.     EPINEPHrine 0.3 mg/0.3 mL IJ SOAJ injection 0.3 mg by injection route. 1 each 1    estradiol (VIVELLE-DOT) 0.1 MG/24HR patch Place 1 patch (0.1 mg total) onto the skin 2 (two) times a week. 24 patch 3   famotidine (PEPCID) 20 MG tablet Take 1 tablet (20 mg total) by mouth daily as needed for heartburn or indigestion. 90 tablet 3   fexofenadine (ALLEGRA ALLERGY) 180 MG tablet Take 1 tablet (180 mg total) by mouth daily. 90 tablet 3   fluticasone (FLONASE) 50 MCG/ACT nasal spray Place 1 spray into both nostrils daily. 48 g 3   glucose blood (ONETOUCH VERIO) test strip Used to check blood sugars 3 times daily. 300 each 3   ipratropium (ATROVENT) 0.03 % nasal spray Place 2 sprays into both nostrils 3 (three) times daily as needed for rhinitis. 90 mL 3   levothyroxine (SYNTHROID) 50 MCG tablet Take 1 tablet (50 mcg total) by mouth daily before breakfast. 90 tablet 3   losartan (COZAAR) 50 MG tablet TAKE 1 TABLET BY MOUTH EVERYDAY AT BEDTIME 30 tablet 4   MAGNESIUM PO Take 250 mg by mouth daily.     meclizine (ANTIVERT) 25 MG tablet Take 1 tablet (25 mg total) by mouth 3 (three) times daily as needed (vertigo). 84 tablet 3   metoprolol succinate (TOPROL-XL) 50 MG 24 hr tablet TAKE 1 TABLET BY MOUTH EVERY DAY 30 tablet 4   montelukast (SINGULAIR) 10 MG tablet Take 1 tablet (10 mg total) by mouth at bedtime. 90 tablet 3   Multiple Vitamin (MULTIVITAMIN) tablet Take 1 tablet by mouth daily.     mupirocin ointment (BACTROBAN) 2 % Apply topically.     ondansetron (ZOFRAN ODT) 4 MG disintegrating tablet Take  1 tablet (4 mg total) by mouth every 8 (eight) hours as needed for nausea or vomiting. 30 tablet 2   OneTouch Delica Lancets 33G MISC Used to check blood sugars twice daly. 100 each 5   Propylene Glycol 0.6 % SOLN Apply 1 drop to eye as needed.     psyllium (METAMUCIL) 58.6 % powder Take 1 packet by mouth as needed.     Spacer/Aero-Holding Chambers (AEROCHAMBER MV) inhaler Use as instructed 1 each 0   Syringe/Needle, Disp, (SYRINGE 3CC/20GX1") 20G X 1" 3 ML MISC 1 Syringe by Does not  apply route every 30 (thirty) days. For use with b-12 injection monthly 50 each 0   Tiotropium Bromide Monohydrate (SPIRIVA RESPIMAT) 1.25 MCG/ACT AERS Inhale 2 each into the lungs daily. 12 g 3   tretinoin (RETIN-A) 0.05 % cream Apply topically at bedtime. 45 g 1   urea (CARMOL) 40 % CREA Apply topically.     No current facility-administered medications for this visit.    Allergies  Allergen Reactions   Codeine Other (See Comments)    Low blood pressure/nausea   Olodaterol Palpitations   Qvar [Beclomethasone] Other (See Comments)    Thrush per patient even with rinsing.    Sulfonamide Derivatives Rash    rash    Review of Systems:  nrg    Physical Exam:    BP 128/72   Pulse 72   Ht 5\' 5"  (1.651 m)   Wt 184 lb 9.6 oz (83.7 kg)   SpO2 97%   BMI 30.72 kg/m  Wt Readings from Last 3 Encounters:  04/14/23 184 lb 9.6 oz (83.7 kg)  03/18/23 181 lb 9.6 oz (82.4 kg)  02/18/23 183 lb (83 kg)   Constitutional:  Well-developed, in no acute distress. Psychiatric: Normal mood and affect. Behavior is normal. HEENT: Pupils normal.  Conjunctivae are normal. No scleral icterus. Cardiovascular: Normal rate, regular rhythm. No edema Pulmonary/chest: Effort normal and breath sounds normal. No wheezing, rales or rhonchi. Abdominal: Soft, nondistended.  Mild left upper quadrant abdominal tenderness without rebound.. Bowel sounds active throughout. There are no masses palpable. No hepatomegaly. Rectal: In presence of Brooke, anorectal exam was normal.  Fissure had completely healed.  Small internal hemorrhoids.  Brown heme-negative stool. Neurological: Alert and oriented to person place and time. Skin: Skin is warm and dry. No rashes noted.  Data Reviewed: I have personally reviewed following labs and imaging studies  CBC:    Latest Ref Rng & Units 02/10/2023   12:27 PM 11/19/2022    2:48 PM 06/10/2022    9:41 AM  CBC  WBC 4.0 - 10.5 K/uL 6.7  7.1  8.2   Hemoglobin 12.0 - 15.0 g/dL 16.1   09.6  04.5   Hematocrit 36.0 - 46.0 % 35.8  38.1  39.7   Platelets 150 - 400 K/uL 290  300.0  319.0     CMP:    Latest Ref Rng & Units 02/10/2023   12:27 PM 11/19/2022    2:48 PM 07/18/2022    2:14 PM  CMP  Glucose 70 - 99 mg/dL 409  99    BUN 8 - 23 mg/dL 15  12    Creatinine 8.11 - 1.00 mg/dL 9.14  7.82  9.56   Sodium 135 - 145 mmol/L 133  137    Potassium 3.5 - 5.1 mmol/L 4.0  4.5    Chloride 98 - 111 mmol/L 100  99    CO2 22 - 32 mmol/L 23  28  Calcium 8.9 - 10.3 mg/dL 8.8  9.7    Total Protein 6.0 - 8.3 g/dL  7.4    Total Bilirubin 0.2 - 1.2 mg/dL  0.9    Alkaline Phos 39 - 117 U/L  76    AST 0 - 37 U/L  21    ALT 0 - 35 U/L  21          Edman Circle, MD 04/14/2023, 10:03 AM  Cc: Sherlene Shams, MD

## 2023-04-16 ENCOUNTER — Ambulatory Visit: Payer: Medicare Other | Admitting: Physical Therapy

## 2023-04-16 ENCOUNTER — Ambulatory Visit
Admission: RE | Admit: 2023-04-16 | Discharge: 2023-04-16 | Disposition: A | Discharge status: Home / Self Care | Payer: Medicare Other | Source: Ambulatory Visit | Attending: Gastroenterology | Admitting: Gastroenterology

## 2023-04-16 ENCOUNTER — Ambulatory Visit (HOSPITAL_COMMUNITY): Payer: Medicare Other

## 2023-04-16 DIAGNOSIS — K625 Hemorrhage of anus and rectum: Secondary | ICD-10-CM

## 2023-04-16 DIAGNOSIS — K649 Unspecified hemorrhoids: Secondary | ICD-10-CM | POA: Diagnosis not present

## 2023-04-16 DIAGNOSIS — R1012 Left upper quadrant pain: Secondary | ICD-10-CM | POA: Diagnosis not present

## 2023-04-16 DIAGNOSIS — I700 Atherosclerosis of aorta: Secondary | ICD-10-CM | POA: Diagnosis not present

## 2023-04-16 MED ORDER — IOHEXOL 300 MG/ML  SOLN
100.0000 mL | Freq: Once | INTRAMUSCULAR | Status: AC | PRN
  Administered 2023-04-16: 100 mL via INTRAVENOUS

## 2023-04-20 ENCOUNTER — Telehealth: Payer: Self-pay | Admitting: Pulmonary Disease

## 2023-04-20 ENCOUNTER — Telehealth: Payer: Self-pay | Admitting: Gastroenterology

## 2023-04-20 NOTE — Telephone Encounter (Signed)
PT states Meds by mail needs to have the dosage changed in order for her to get the RX mailed.  Needs to state on dose t 3-4 inhalations per day, not two. He has to write it like this in order for her to get  the RX. Other wise she runs out before the next dosage is mailed. This will keep her from running out and then once she gets a couple of extra inhalers he can change it back.  She is on her last tube and they can not send her anymore until it is changed and resent. Help!

## 2023-04-20 NOTE — Telephone Encounter (Signed)
PT is calling to discuss results of CT. Please advise.

## 2023-04-20 NOTE — Telephone Encounter (Signed)
Pt requesting the results from recent CT scan. Pt was notified that the CT has not been read by the radiologist and after it is read by the radiologist then Dr. Chales Abrahams will review we will notify her with the results and any recommendations: Pt verbalized understanding with all questions answered.

## 2023-04-20 NOTE — Telephone Encounter (Signed)
Spoke to patient.  She is requesting for spiriva Rx to be written for 3-4 inhalations daily in order to not run out of medication before her medication arrives by mail through [place].  Verified with patient that she uses Spiriva 2 puff daily.  Dr. Craige Cotta, please advise if okay to order? Thanks

## 2023-04-20 NOTE — Telephone Encounter (Signed)
Spiriva should only be two puffs daily.  Her albuterol is used 3 to 4 times per day as needed.  Can we confirm that she needs the script for spiriva change and not for albuterol?  If the script change needed is indeed for spiriva, then okay to send change in script for spiriva.

## 2023-04-21 ENCOUNTER — Ambulatory Visit: Payer: Medicare Other

## 2023-04-21 MED ORDER — SPIRIVA RESPIMAT 1.25 MCG/ACT IN AERS: 3.0000 | INHALATION_SPRAY | Freq: Every day | RESPIRATORY_TRACT | 3 refills | Status: DC

## 2023-04-21 NOTE — Telephone Encounter (Signed)
I spoke with the patient and verified that it is the Spiriva she needs a new prescription for. I have sent in her prescription.  Nothing further needed.

## 2023-04-23 ENCOUNTER — Ambulatory Visit: Payer: Medicare Other | Admitting: Physical Therapy

## 2023-04-24 ENCOUNTER — Telehealth: Payer: Self-pay | Admitting: Pulmonary Disease

## 2023-04-24 NOTE — Telephone Encounter (Signed)
Pt called incorrect doctor, pls disregard

## 2023-04-24 NOTE — Telephone Encounter (Signed)
Pt called in to get CT results

## 2023-04-29 NOTE — Progress Notes (Signed)
Cardiology Office Note    Date:  05/01/2023   ID:  Angela Mendoza, DOB May 21, 1951, MRN [id number]  PCP:  Sherlene Shams, MD  Cardiologist:  Julien Nordmann, MD  Electrophysiologist:  None   Chief Complaint: Follow up  History of Present Illness:   Angela Mendoza is a 72 y.o. female with history of reported PAF, HTN, OSA on CPAP, asthma, and anxiety who presents for follow-up of A-fib.  Prior note indicates reported A-fib in 2016 with details being unclear.  Has not been maintained on anticoagulation by primary cardiologist given the lack of documented recurrence.  Calcium score in 06/2018 of 3 which was the 49th percentile.  Echo from 12/2020 demonstrated an EF of 60 to 65%, no regional wall motion abnormalities, grade 2 diastolic dysfunction, normal RV systolic function and ventricular cavity size, mildly elevated PASP estimated at 39.1 mmHg, and mild mitral regurgitation.  Zio patch from 12/2020 showed a predominant rhythm of sinus with an average rate of 71 bpm (range 50 to 127 bpm), rare atrial and ventricular ectopy, and no significant arrhythmia.  Coronary CTA in 03/2021 showed a calcium score of 0 with no evidence of CAD.  Noncardiac over read showed aortic atherosclerosis.  She was last seen in the office in 04/2022 and was without symptoms of angina or cardiac decompensation.  She was maintaining sinus rhythm.  No changes were made at this time.  She was seen in the ED and 01/2023 with URI.  Troponin negative.  Chest x-ray without acute cardiopulmonary process.  Treated for asthma exacerbation in April.  CT abdomen pelvis in 03/2023, showed colonic diverticulosis without evidence of diverticulitis or other acute findings along with aortic atherosclerosis.  She comes in today noting a 72-month history of progressive and intermittent bilateral lower extremity swelling along the ankles predominantly with the IV being worse than the right.  There is prior trauma to the left ankle  following mechanical fall as well as possible history of plantars fasciitis requiring injection.  She also feels bloated with associated early satiety and notes some swelling along her hands.  No progressive orthopnea.  By our scale, her weight is stable 14 pounds when compared to revisit in 04/2022, however this is up 10 pounds when compared to her visit with PCP in 10/2022 following noncardiac surgery with diminished appetite at that time.  She reports diffuse arthralgias.  No frank chest pain.  She has been under increased stress lately surrounding the health of her husband.   Labs independently reviewed: 03/2023 - Hgb 12.5, PLT 321, potassium 4.0, BUN 9, serum creatinine 0.82, albumin 4.4, AST/ALT normal 10/2022 - direct LDL 85, TC 170, TG 225, HDL 46, A1c 6.3 05/2022 - TSH normal  Past Medical History:  Diagnosis Date   Agatston coronary artery calcium score less than 100    a. 06/2018 Cardiac CT: Ca2+ = 3 (49th %'ile).   Allergic rhinitis 06/09/2008   chronic   Allergy    Aortic atherosclerosis (HCC)    a. 06/2018 noted on chest CT.   Arthritis    Asthma, intrinsic 10/06/2011   controlled on qvar. Cleda Daub 09/2011:  Very mild airflow obstruction (FEV1% 68, FVL c/w obstruction)    Basal cell carcinoma 06/10/2021   right prox nasal ala rim - MOHs 07/12/21 Dr. Adriana Simas   Cataract    Chronic headache 06/09/2008   COPD (chronic obstructive pulmonary disease) (HCC)    Depression    Diverticulosis 2013   h/o diverticulitis  Dry eyes    GERD (gastroesophageal reflux disease)    Heart murmur    History of anal fissures    x2   History of breast implant removal    silicone mastitis - R axilla silicone LN, free silicone L breast upper outer quadrant   Hot flashes    HYPERLIPIDEMIA 06/09/2008   HYPERTENSION 06/09/2008   Hypothyroidism    MVP (mitral valve prolapse)    OSA on CPAP    [name]   PAF (paroxysmal atrial fibrillation) (HCC) 06/09/2008   a. CHA2DS2VASc = 3-->prefers ASA.    Pernicious anemia    per prior pcp   Rosacea    Sleep apnea    Surgical menopause 1991   Vertigo    Vitamin D deficiency     Past Surgical History:  Procedure Laterality Date   APPENDECTOMY  2007   bladder tack     BREAST BIOPSY Left 09/27/2009   BREAST ENHANCEMENT SURGERY  2006   and removal   CARDIOVASCULAR STRESS TEST  2013    Dr. Dulce Sellar - WNL, EF 55-60%, with 0 calcium score   CESAREAN SECTION     CHOLECYSTECTOMY  2007   biliary dyskinesia   COLONOSCOPY  08/2012   diverticulosis   COLONOSCOPY  2006   inflamed hyperplastic polyp   ESOPHAGOGASTRODUODENOSCOPY  08/2012   esophageal reflux   ESOPHAGOGASTRODUODENOSCOPY  11/02/2008   Dr Chales Abrahams Minimal gastritis. Irregular Z line suggestive of gastroesophageal reflux (biopsied to rule out short-segment Barrett's esophagus) Incidental gastric polyps   HERNIA REPAIR     NASAL SINUS SURGERY     RECTOCELE REPAIR  12/2008   Dr. Thamas Jaegers   sleep study  08/2007   OSA, 12 cm H2O,   TOTAL ABDOMINAL HYSTERECTOMY  1991   heavy bleeding, ovaries removed   TUBAL LIGATION     tummy tuck     US ECHOCARDIOGRAPHY  09/2009   impaired relaxation, mild tric regurg, normal MV, EF 60%   US ECHOCARDIOGRAPHY  11/2007   mild-mod MR    Current Medications: Current Meds  Medication Sig   albuterol (PROVENTIL HFA) 108 (90 Base) MCG/ACT inhaler Inhale 2 puffs into the lungs every 6 (six) hours as needed.   ALPRAZolam (XANAX) 0.25 MG tablet TAKE 1 TABLET (0.25 MG TOTAL) BY MOUTH ONCE DAILY AS NEEDED FOR SLEEP.   AMBULATORY NON FORMULARY MEDICATION Medication Name: Diltiazem 2%/Lidocaine 2%   Using your index finger apply a small amount of medication inside the anal opening and to the external anal area twice daily x 6 weeks.   atorvastatin (LIPITOR) 40 MG tablet Take 1 tablet (40 mg total) by mouth daily.   azelastine (ASTELIN) 0.1 % nasal spray Place 2 sprays into both nostrils 2 (two) times daily. Use in each nostril as directed    benzonatate (TESSALON) 200 MG capsule Take 1 capsule (200 mg total) by mouth 3 (three) times daily as needed for cough.   Blood Glucose Calibration (ONETOUCH VERIO) High SOLN Used to check accuracy of blood glucose machine.   chlorpheniramine (CHLOR-TRIMETON) 4 MG tablet Take 1 tablet (4 mg total) by mouth at bedtime as needed for allergies or rhinitis.   Cholecalciferol (VITAMIN D3) 50 MCG (2000 UT) TABS Take 1 tablet by mouth daily.   Coenzyme Q10 (COQ10 PO) Take by mouth daily.   conjugated estrogens (PREMARIN) vaginal cream Place vaginally 2 (two) times a week. INSERT 0.5 APPLICATORS FULL VAGINALLY TWICE PER WEEK. AT BEDTIME.   cyanocobalamin (VITAMIN B12) 1000 MCG/ML injection  Inject 1 mL (1,000 mcg total) into the muscle every 30 (thirty) days.   cycloSPORINE (RESTASIS) 0.05 % ophthalmic emulsion Place 1 drop into both eyes 2 (two) times daily.   desonide (DESOWEN) 0.05 % cream Apply topically as needed.   diazepam (VALIUM) 5 MG tablet Take 1 tablet (5 mg total) by mouth every 6 (six) hours as needed for anxiety.   Docusate Calcium (STOOL SOFTENER PO) Take 1 tablet by mouth as needed.   EPINEPHrine 0.3 mg/0.3 mL IJ SOAJ injection 0.3 mg by injection route.   estradiol (VIVELLE-DOT) 0.1 MG/24HR patch Place 1 patch (0.1 mg total) onto the skin 2 (two) times a week.   famotidine (PEPCID) 20 MG tablet Take 1 tablet (20 mg total) by mouth daily as needed for heartburn or indigestion.   fexofenadine (ALLEGRA ALLERGY) 180 MG tablet Take 1 tablet (180 mg total) by mouth daily.   fluticasone (FLONASE) 50 MCG/ACT nasal spray Place 1 spray into both nostrils daily.   glucose blood (ONETOUCH VERIO) test strip Used to check blood sugars 3 times daily.   hydrocortisone (ANUSOL-HC) 2.5 % rectal cream Place 1 Application rectally 2 (two) times daily. For 10 days   ipratropium (ATROVENT) 0.03 % nasal spray Place 2 sprays into both nostrils 3 (three) times daily as needed for rhinitis.   levothyroxine  (SYNTHROID) 50 MCG tablet Take 1 tablet (50 mcg total) by mouth daily before breakfast.   losartan (COZAAR) 50 MG tablet TAKE 1 TABLET BY MOUTH EVERYDAY AT BEDTIME   MAGNESIUM PO Take 250 mg by mouth daily.   meclizine (ANTIVERT) 25 MG tablet Take 1 tablet (25 mg total) by mouth 3 (three) times daily as needed (vertigo).   metoprolol succinate (TOPROL-XL) 50 MG 24 hr tablet TAKE 1 TABLET BY MOUTH EVERY DAY   montelukast (SINGULAIR) 10 MG tablet Take 1 tablet (10 mg total) by mouth at bedtime.   Multiple Vitamin (MULTIVITAMIN) tablet Take 1 tablet by mouth daily.   mupirocin ointment (BACTROBAN) 2 % Apply topically as needed.   ondansetron (ZOFRAN ODT) 4 MG disintegrating tablet Take 1 tablet (4 mg total) by mouth every 8 (eight) hours as needed for nausea or vomiting.   OneTouch Delica Lancets 33G MISC Used to check blood sugars twice daly.   Propylene Glycol 0.6 % SOLN Apply 1 drop to eye as needed.   psyllium (METAMUCIL) 58.6 % powder Take 1 packet by mouth as needed.   Spacer/Aero-Holding Chambers (AEROCHAMBER MV) inhaler Use as instructed   Syringe/Needle, Disp, (SYRINGE 3CC/20GX1") 20G X 1" 3 ML MISC 1 Syringe by Does not apply route every 30 (thirty) days. For use with b-12 injection monthly   tretinoin (RETIN-A) 0.05 % cream Apply topically at bedtime.   urea (CARMOL) 40 % CREA Apply topically.   [DISCONTINUED] Tiotropium Bromide Monohydrate (SPIRIVA RESPIMAT) 1.25 MCG/ACT AERS Inhale 3-4 puffs into the lungs daily.    Allergies:   Codeine, Olodaterol, Qvar [beclomethasone], and Sulfonamide derivatives   Social History   Socioeconomic History   Marital status: Married    Spouse name: Not on file   Number of children: Not on file   Years of education: Not on file   Highest education level: Not on file  Occupational History   Occupation: retired  Tobacco Use   Smoking status: Former    Packs/day: 0.30    Years: 7.00    Additional pack years: 0.00    Total pack years: 2.10     Types: Cigarettes  Quit date: 10/27/1977    Years since quitting: 45.5    Passive exposure: Past   Smokeless tobacco: Never   Tobacco comments:    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 Hx of children who are grown  Vaping Use   Vaping Use: Never used  Substance and Sexual Activity   Alcohol use: Not Currently    Comment: occ   Drug use: No   Sexual activity: Not on file  Other Topics Concern   Not on file  Social History Narrative   Lives with husband    Grown children   Occ: retired, was Research scientist (medical)   Edu: trade degree then 2 yrscollege   Social Determinants of Health   Financial Resource Strain: Low Risk  (05/28/2022)   Overall Financial Resource Strain (CARDIA)    Difficulty of Paying Living Expenses: Not hard at all  Food Insecurity: No Food Insecurity (05/28/2022)   Hunger Vital Sign    Worried About Running Out of Food in the Last Year: Never true    Ran Out of Food in the Last Year: Never true  Transportation Needs: No Transportation Needs (05/28/2022)   PRAPARE - Administrator, Civil Service (Medical): No    Lack of Transportation (Non-Medical): No  Physical Activity: Sufficiently Active (05/28/2022)   Exercise Vital Sign    Days of Exercise per Week: 5 days    Minutes of Exercise per Session: 30 min  Stress: No Stress Concern Present (05/28/2022)   [employer] of Occupational Health - Occupational Stress Questionnaire    Feeling of Stress : Not at all  Social Connections: Unknown (05/28/2022)   Social Connection and Isolation Panel [NHANES]    Frequency of Communication with Friends and Family: More than three times a week    Frequency of Social Gatherings with Friends and Family: More than three times a week    Attends Religious Services: Not on Marketing executive or Organizations: Not on file    Attends Banker Meetings: Not on file    Marital Status: Married     Family History:  The patient's family  history includes AAA (abdominal aortic aneurysm) in her paternal uncle; Aortic dissection in her maternal aunt and paternal uncle; Breast cancer in her maternal aunt; COPD in her sister; Cancer in her cousin and paternal aunt; Cancer (age of onset: 34) in her father; Cancer (age of onset: 45) in her mother; Colon polyps in her sister; Heart attack in her paternal uncle; Heart disease in her paternal uncle; Heart failure in her sister; Hypertension in her sister; Stroke in her paternal uncle and sister. There is no history of Colon cancer, Rectal cancer, Stomach cancer, or Esophageal cancer.  ROS:   12-point review of systems is negative unless otherwise noted in the HPI.   EKGs/Labs/Other Studies Reviewed:    Studies reviewed were summarized above. The additional studies were reviewed today:  Coronary CTA 04/04/2021: FINDINGS: Aorta: Normal size. Mild root and descending aortic wall calcifications. No dissection.   Aortic Valve:  Trileaflet.  No calcifications.   Coronary Arteries:  Normal coronary origin.  Right dominance.   RCA is a dominant artery that gives rise to PDA and PLA. There is no plaque.   Left main is a large artery that gives rise to LAD and LCX arteries.   LAD has no plaque.   LCX is a non-dominant artery that gives rise to two obtuse marginal branches. There  is no plaque.   Other findings:   Normal pulmonary vein drainage into the left atrium.   Normal left atrial appendage without a thrombus.   Normal size of the pulmonary artery.   IMPRESSION: 1. Coronary calcium score of 0. Patient is low risk for coronary events. 2. Normal coronary origin with right dominance. 3. No evidence of CAD. 4. CAD-RADS 0. Consider non-atherosclerotic causes of chest pain. __________  [place] patch 12/2020: Predominant underlying rhythm was Sinus Rhythm. Patient had a min HR of 50 bpm, max HR of 127 bpm, and avg HR of 71 bpm.    Isolated SVEs  were rare (<1.0%), SVE Couplets  were rare (<1.0%), and no SVE Triplets were present. Isolated VEs were rare (<1.0%), VE Couplets were rare (<1.0%), and no VE Triplets were present.    Most patient triggered events (107)  associated with normal sinus, no arrhythmia. __________  2D echo 01/23/2021: 1. Left ventricular ejection fraction, by estimation, is 60 to 65%. The  left ventricle has normal function. The left ventricle has no regional  wall motion abnormalities. Left ventricular diastolic parameters are  consistent with Grade II diastolic  dysfunction (pseudonormalization).   2. Right ventricular systolic function is normal. The right ventricular  size is normal. There is mildly elevated pulmonary artery systolic  pressure. The estimated right ventricular systolic pressure is 39.1 mmHg.   3. The mitral valve is normal in structure. Mild mitral valve  regurgitation.  ___________  Calcium score 07/12/2018: IMPRESSION: Coronary calcium score of 3. This was 6 th percentile for age and sex matched control. __________  See Epic for more remote studies    EKG:  EKG is ordered today.  The EKG ordered today demonstrates NSR, 70 bpm, nonspecific ST-T changes, consistent with prior tracing  Recent Labs: 05/28/2022: TSH 2.44 06/10/2022: Pro B Natriuretic peptide (BNP) 37.0 04/14/2023: ALT 21; BUN 9; Creatinine, Ser 0.82; Hemoglobin 12.5; Platelets 321.0; Potassium 4.0; Sodium 136  Recent Lipid Panel    Component Value Date/Time   CHOL 170 11/19/2022 1448   TRIG 225.0 (H) 11/19/2022 1448   TRIG 199 04/12/2013 0000   HDL 46.90 11/19/2022 1448   CHOLHDL 4 11/19/2022 1448   VLDL 45.0 (H) 11/19/2022 1448   LDLCALC 66 05/30/2022 1428   LDLCALC 93 04/12/2013 0000   LDLDIRECT 85.0 11/19/2022 1448    PHYSICAL EXAM:    VS:  BP 130/76 (BP Location: Left Arm, Patient Position: Sitting, Cuff Size: Large)   Pulse 70   Ht 5\' 5"  (1.651 m)   Wt 182 lb 9.6 oz (82.8 kg)   SpO2 98%   BMI 30.39 kg/m   BMI: Body mass index is  30.39 kg/m.  Physical Exam Vitals reviewed.  Constitutional:      Appearance: She is well-developed.  HENT:     Head: Normocephalic and atraumatic.  Eyes:     General:        Right eye: No discharge.        Left eye: No discharge.  Neck:     Vascular: No JVD.  Cardiovascular:     Rate and Rhythm: Normal rate and regular rhythm.     Pulses:          Dorsalis pedis pulses are 1+ on the right side and 1+ on the left side.       Posterior tibial pulses are 1+ on the right side and 1+ on the left side.     Heart sounds: Normal heart sounds, S1 normal  and S2 normal. Heart sounds not distant. No midsystolic click and no opening snap. No murmur heard.    No friction rub.     Comments: No nonhealing wounds along the bilateral lower extremities. Pulmonary:     Effort: Pulmonary effort is normal. No respiratory distress.     Breath sounds: Normal breath sounds. No decreased breath sounds, wheezing or rales.  Chest:     Chest wall: No tenderness.  Abdominal:     General: There is no distension.  Musculoskeletal:     Cervical back: Normal range of motion.     Comments: Trivial bilateral ankle edema with the left being worse than the right.  Skin:    General: Skin is warm and dry.     Nails: There is no clubbing.  Neurological:     Mental Status: She is alert and oriented to person, place, and time.  Psychiatric:        Speech: Speech normal.        Behavior: Behavior normal.        Thought Content: Thought content normal.        Judgment: Judgment normal.     Wt Readings from Last 3 Encounters:  05/01/23 182 lb 9.6 oz (82.8 kg)  04/14/23 184 lb 9.6 oz (83.7 kg)  03/18/23 181 lb 9.6 oz (82.4 kg)     ASSESSMENT & PLAN:   Lower extremity swelling/discomfort: Appears to be consistent with venous insufficiency/dependent edema based on history.  Cannot exclude prior trauma to the left lower extremity/ankle as well with prior history of possible plantar fasciitis while has trauma  from a mechanical fall.  Echo from 2022 did show grade 2 diastolic dysfunction.  Recommend leg elevation and compression socks.  She does report a sensation of early satiety and bloating.  Obtain echo to evaluate for new cardiomyopathy and trend PASP.  Diminished pedal pulses bilaterally.  Schedule ABIs to evaluate for significant PAD.  No evidence of critical limb ischemia.  With noted diffuse arthralgias throughout we will obtain sed rate and CRP with further management per PCP.  Check CBC and CMP.  May need to consider as needed furosemide.  PAF: Maintaining sinus rhythm.  Prior documentation indicates episode in 2016, further details not available.  Primary cardiologist has historically not maintain patient on anticoagulation.  Remains on Toprol-XL.  HTN: Blood pressure is well controlled in the office today.  She remains on metoprolol succinate and losartan.  Aortic atherosclerosis/HLD: LDL 85 with a triglyceride of 225 in 10/2022 (unclear if fasting).  Target LDL less than 70.  Remains on Lipitor 40 mg.  Future orders placed for fasting lipid panel and CMP.  Recommend escalating lipid-lowering therapy as indicated to achieve target LDL.  OSA: CPAP per pulmonology.    Disposition: F/u with Dr. Mariah Milling or an APP in 2 months.   Medication Adjustments/Labs and Tests Ordered: Current medicines are reviewed at length with the patient today.  Concerns regarding medicines are outlined above. Medication changes, Labs and Tests ordered today are summarized above and listed in the Patient Instructions accessible in Encounters.   Signed, Eula Listen, PA-C 05/01/2023 3:24 PM     [facility] [place] [address] [place], [place] [address] [phone]

## 2023-05-01 ENCOUNTER — Encounter: Payer: Self-pay | Admitting: Physician Assistant

## 2023-05-01 ENCOUNTER — Other Ambulatory Visit: Payer: Self-pay | Admitting: Pulmonary Disease

## 2023-05-01 ENCOUNTER — Telehealth: Payer: Self-pay | Admitting: Pulmonary Disease

## 2023-05-01 ENCOUNTER — Ambulatory Visit: Payer: Medicare Other | Attending: Physician Assistant | Admitting: Physician Assistant

## 2023-05-01 VITALS — BP 130/76 | HR 70 | Ht 65.0 in | Wt 182.6 lb

## 2023-05-01 DIAGNOSIS — I480 Paroxysmal atrial fibrillation: Secondary | ICD-10-CM

## 2023-05-01 DIAGNOSIS — G4733 Obstructive sleep apnea (adult) (pediatric): Secondary | ICD-10-CM | POA: Diagnosis not present

## 2023-05-01 DIAGNOSIS — E785 Hyperlipidemia, unspecified: Secondary | ICD-10-CM

## 2023-05-01 DIAGNOSIS — R140 Abdominal distension (gaseous): Secondary | ICD-10-CM | POA: Diagnosis not present

## 2023-05-01 DIAGNOSIS — I700 Atherosclerosis of aorta: Secondary | ICD-10-CM

## 2023-05-01 DIAGNOSIS — M7989 Other specified soft tissue disorders: Secondary | ICD-10-CM

## 2023-05-01 DIAGNOSIS — R0989 Other specified symptoms and signs involving the circulatory and respiratory systems: Secondary | ICD-10-CM | POA: Diagnosis not present

## 2023-05-01 MED ORDER — SPIRIVA RESPIMAT 1.25 MCG/ACT IN AERS: 3.0000 | INHALATION_SPRAY | Freq: Every day | RESPIRATORY_TRACT | 3 refills | Status: DC

## 2023-05-01 NOTE — Telephone Encounter (Signed)
?   What inhaler and what pharmacy?  Called the pt and asked that she call back and provide this info so that we can help  There was no answer- LMTCB.

## 2023-05-01 NOTE — Patient Instructions (Signed)
Medication Instructions:  Your Physician recommend you continue on your current medication as directed.    *If you need a refill on your cardiac medications before your next appointment, please call your pharmacy*   Lab Work: Your provider would like for you to return on Monday to have the following labs drawn: CMET, CBC, Lipid, Sed rate and CRP.   Please go to the [facility] entrance and check in at the front desk.  You do not need an appointment.  They are open from 7am-6 pm.  You will need to be fasting.  If you have labs (blood work) drawn today and your tests are completely normal, you will receive your results only by: MyChart Message (if you have MyChart) OR A paper copy in the mail If you have any lab test that is abnormal or we need to change your treatment, we will call you to review the results.   Testing/Procedures: Your physician has requested that you have an echocardiogram. Echocardiography is a painless test that uses sound waves to create images of your heart. It provides your doctor with information about the size and shape of your heart and how well your heart's chambers and valves are working.   You may receive an ultrasound enhancing agent through an IV if needed to better visualize your heart during the echo. This procedure takes approximately one hour.  There are no restrictions for this procedure.  This will take place at [address] [address], [place] [address]   Your physician has requested that you have an ankle brachial index (ABI). During this test an ultrasound and blood pressure cuff are used to evaluate the arteries that supply the arms and legs with blood.  Allow thirty minutes for this exam.  There are no restrictions or special instructions.  This will take place at [address] ([facility] [address], [place] [address]    Follow-Up: At [facility], you and your health needs are our priority.   As part of our continuing mission to provide you with exceptional heart care, we have created designated Provider Care Teams.  These Care Teams include your primary Cardiologist (physician) and Advanced Practice Providers (APPs -  Physician Assistants and Nurse Practitioners) who all work together to provide you with the care you need, when you need it.  We recommend signing up for the patient portal called "MyChart".  Sign up information is provided on this After Visit Summary.  MyChart is used to connect with patients for Virtual Visits (Telemedicine).  Patients are able to view lab/test results, encounter notes, upcoming appointments, etc.  Non-urgent messages can be sent to your provider as well.   To learn more about what you can do with MyChart, go to [email or web address].    Your next appointment:   2 month(s)  Provider:   Eula Listen, PA-C

## 2023-05-01 NOTE — Telephone Encounter (Signed)
Spoke with patient. I have resent Spiriva inhaler to [facility] requesting they provide her with refills of Spiriva asap as she is almost out. Closing encounter. NFN

## 2023-05-01 NOTE — Telephone Encounter (Signed)
Pt states she is on last inhaler. Pharmacy is refusing to refill until there is clarification on the RX.

## 2023-05-04 ENCOUNTER — Other Ambulatory Visit
Admission: RE | Admit: 2023-05-04 | Discharge: 2023-05-04 | Disposition: A | Discharge status: Home / Self Care | Payer: Medicare Other | Source: Ambulatory Visit | Attending: Physician Assistant | Admitting: Physician Assistant

## 2023-05-04 DIAGNOSIS — I700 Atherosclerosis of aorta: Secondary | ICD-10-CM | POA: Diagnosis not present

## 2023-05-04 DIAGNOSIS — D649 Anemia, unspecified: Secondary | ICD-10-CM | POA: Diagnosis not present

## 2023-05-04 LAB — CBC
HCT: 40.1 % (ref 36.0–46.0)
Hemoglobin: 13 g/dL (ref 12.0–15.0)
MCH: 30.6 pg (ref 26.0–34.0)
MCHC: 32.4 g/dL (ref 30.0–36.0)
MCV: 94.4 fL (ref 80.0–100.0)
Platelets: 261 10*3/uL (ref 150–400)
RBC: 4.25 MIL/uL (ref 3.87–5.11)
RDW: 12.6 % (ref 11.5–15.5)
WBC: 5.4 10*3/uL (ref 4.0–10.5)
nRBC: 0 % (ref 0.0–0.2)

## 2023-05-04 LAB — LIPID PANEL
Cholesterol: 165 mg/dL (ref 0–200)
HDL: 43 mg/dL (ref >40)
LDL Cholesterol: 62 mg/dL (ref 0–99)
Total CHOL/HDL Ratio: 3.8 RATIO
Triglycerides: 298 mg/dL — ABNORMAL HIGH (ref <150)
VLDL: 60 mg/dL — ABNORMAL HIGH (ref 0–40)

## 2023-05-04 LAB — COMPREHENSIVE METABOLIC PANEL
ALT: 25 U/L (ref 0–44)
AST: 25 U/L (ref 15–41)
Albumin: 4.5 g/dL (ref 3.5–5.0)
Alkaline Phosphatase: 62 U/L (ref 38–126)
Anion gap: 9 (ref 5–15)
BUN: 11 mg/dL (ref 8–23)
CO2: 25 mmol/L (ref 22–32)
Calcium: 9.3 mg/dL (ref 8.9–10.3)
Chloride: 102 mmol/L (ref 98–111)
Creatinine, Ser: 0.73 mg/dL (ref 0.44–1.00)
GFR, Estimated: >60 mL/min (ref >60)
Glucose, Bld: 109 mg/dL — ABNORMAL HIGH (ref 70–99)
Potassium: 4.2 mmol/L (ref 3.5–5.1)
Sodium: 136 mmol/L (ref 135–145)
Total Bilirubin: 1.1 mg/dL (ref 0.3–1.2)
Total Protein: 7.7 g/dL (ref 6.5–8.1)

## 2023-05-04 LAB — C-REACTIVE PROTEIN: CRP: 0.6 mg/dL (ref <1.0)

## 2023-05-04 LAB — SEDIMENTATION RATE: Sed Rate: 28 mm/hr (ref 0–30)

## 2023-05-06 ENCOUNTER — Encounter: Payer: Self-pay | Admitting: Physician Assistant

## 2023-05-07 ENCOUNTER — Ambulatory Visit: Payer: Medicare Other | Admitting: Physical Therapy

## 2023-05-08 ENCOUNTER — Ambulatory Visit: Payer: Medicare Other

## 2023-05-14 ENCOUNTER — Encounter: Payer: Medicare Other | Admitting: Physical Therapy

## 2023-05-14 ENCOUNTER — Telehealth: Payer: Self-pay | Admitting: Pulmonary Disease

## 2023-05-14 MED ORDER — SPIRIVA RESPIMAT 1.25 MCG/ACT IN AERS: 3.0000 | INHALATION_SPRAY | Freq: Every day | RESPIRATORY_TRACT | 10 refills | Status: DC

## 2023-05-14 NOTE — Telephone Encounter (Signed)
Patient states needs refill for Spiriva inhaler. Pharmacy is [facility] [address] [place]. Also would like sample of Spiriva inhaler. Patient phone number is [phone].

## 2023-05-14 NOTE — Telephone Encounter (Signed)
Called and informed pt, refill has been sent into pharmacy

## 2023-05-14 NOTE — Telephone Encounter (Signed)
Pt. Calling back wants to get a sample of spiriva to hold her till mail order comes [facility] came back and said $268 and is willing to drive to [facility] if they are the only one with samples

## 2023-05-15 MED ORDER — SPIRIVA RESPIMAT 1.25 MCG/ACT IN AERS: 2.0000 | INHALATION_SPRAY | Freq: Every day | RESPIRATORY_TRACT | Status: DC

## 2023-05-15 NOTE — Telephone Encounter (Signed)
Spoke to patient. She is requesting sample of spiriva to get by until her Rx is delivered. One sample of Spiriva 1.25 has been placed up front for pickup. Nothing further needed.

## 2023-05-18 ENCOUNTER — Other Ambulatory Visit: Payer: Self-pay | Admitting: Physician Assistant

## 2023-05-18 DIAGNOSIS — G4733 Obstructive sleep apnea (adult) (pediatric): Secondary | ICD-10-CM

## 2023-05-18 DIAGNOSIS — R140 Abdominal distension (gaseous): Secondary | ICD-10-CM

## 2023-05-18 DIAGNOSIS — E785 Hyperlipidemia, unspecified: Secondary | ICD-10-CM

## 2023-05-18 DIAGNOSIS — I700 Atherosclerosis of aorta: Secondary | ICD-10-CM

## 2023-05-18 DIAGNOSIS — R0989 Other specified symptoms and signs involving the circulatory and respiratory systems: Secondary | ICD-10-CM

## 2023-05-18 DIAGNOSIS — M7989 Other specified soft tissue disorders: Secondary | ICD-10-CM

## 2023-05-18 DIAGNOSIS — I480 Paroxysmal atrial fibrillation: Secondary | ICD-10-CM

## 2023-05-19 DIAGNOSIS — H16223 Keratoconjunctivitis sicca, not specified as Sjogren's, bilateral: Secondary | ICD-10-CM | POA: Diagnosis not present

## 2023-05-20 ENCOUNTER — Ambulatory Visit
Admission: RE | Admit: 2023-05-20 | Discharge: 2023-05-20 | Disposition: A | Discharge status: Home / Self Care | Payer: Medicare Other | Source: Ambulatory Visit | Attending: Internal Medicine | Admitting: Internal Medicine

## 2023-05-20 DIAGNOSIS — Z1231 Encounter for screening mammogram for malignant neoplasm of breast: Secondary | ICD-10-CM

## 2023-05-21 ENCOUNTER — Encounter: Payer: Medicare Other | Admitting: Physical Therapy

## 2023-05-26 ENCOUNTER — Ambulatory Visit: Payer: Medicare Other | Attending: Physician Assistant

## 2023-05-26 DIAGNOSIS — I480 Paroxysmal atrial fibrillation: Secondary | ICD-10-CM | POA: Diagnosis not present

## 2023-05-26 DIAGNOSIS — M7989 Other specified soft tissue disorders: Secondary | ICD-10-CM | POA: Insufficient documentation

## 2023-05-26 DIAGNOSIS — R140 Abdominal distension (gaseous): Secondary | ICD-10-CM | POA: Diagnosis not present

## 2023-05-26 DIAGNOSIS — R600 Localized edema: Secondary | ICD-10-CM | POA: Insufficient documentation

## 2023-05-26 LAB — ECHOCARDIOGRAM COMPLETE
Area-P 1/2: 3.37 cm2
S' Lateral: 2.5 cm

## 2023-05-27 ENCOUNTER — Encounter (INDEPENDENT_AMBULATORY_CARE_PROVIDER_SITE_OTHER): Payer: Self-pay

## 2023-05-28 ENCOUNTER — Encounter: Payer: Medicare Other | Admitting: Physical Therapy

## 2023-05-29 ENCOUNTER — Encounter: Payer: Self-pay | Admitting: Physician Assistant

## 2023-06-01 ENCOUNTER — Ambulatory Visit (INDEPENDENT_AMBULATORY_CARE_PROVIDER_SITE_OTHER): Payer: Medicare Other | Admitting: *Deleted

## 2023-06-01 VITALS — Ht 65.0 in | Wt 176.5 lb

## 2023-06-01 DIAGNOSIS — Z0000 Encounter for general adult medical examination without abnormal findings: Secondary | ICD-10-CM | POA: Diagnosis not present

## 2023-06-01 NOTE — Progress Notes (Addendum)
Subjective:   Angela Mendoza is a 72 y.o. female who presents for Medicare Annual (Subsequent) preventive examination.  Visit Complete: Virtual  I connected with  Angela Mendoza on 06/01/23 by a audio enabled telemedicine application and verified that I am speaking with the correct person using two identifiers.  Patient Location: Home  Provider Location: Office/Clinic  I discussed the limitations of evaluation and management by telemedicine. The patient expressed understanding and agreed to proceed.  Vital Signs: Unable to obtain new vitals due to this being a telehealth visit.   Review of Systems     Cardiac Risk Factors include: advanced age (>32men, >55 women)     Objective:    Today's Vitals   06/01/23 1003  Weight: 176 lb 8 oz (80.1 kg)  Height: 5\' 5"  (1.651 m)   Body mass index is 29.37 kg/m.     06/01/2023   10:28 AM 02/10/2023   12:28 PM 05/28/2022   11:43 AM 05/27/2021    3:27 PM 12/29/2020    2:21 PM 05/23/2020   11:25 AM 05/23/2019   10:27 AM  Advanced Directives  Does Patient Have a Medical Advance Directive? Yes No No No No No No  Type of Estate agent of [place];Living will        Copy of Healthcare Power of Attorney in Chart? No - copy requested        Would patient like information on creating a medical advance directive?   No - Patient declined No - Patient declined No - Patient declined No - Patient declined Yes (MAU/Ambulatory/Procedural Areas - Information given)    Current Medications (verified) Outpatient Encounter Medications as of 06/01/2023  Medication Sig   albuterol (PROVENTIL HFA) 108 (90 Base) MCG/ACT inhaler Inhale 2 puffs into the lungs every 6 (six) hours as needed.   ALPRAZolam (XANAX) 0.25 MG tablet TAKE 1 TABLET (0.25 MG TOTAL) BY MOUTH ONCE DAILY AS NEEDED FOR SLEEP.   atorvastatin (LIPITOR) 40 MG tablet Take 1 tablet (40 mg total) by mouth daily.   azelastine (ASTELIN) 0.1 % nasal spray Place 2 sprays into both  nostrils 2 (two) times daily. Use in each nostril as directed   benzonatate (TESSALON) 200 MG capsule Take 1 capsule (200 mg total) by mouth 3 (three) times daily as needed for cough.   Blood Glucose Calibration (ONETOUCH VERIO) High SOLN Used to check accuracy of blood glucose machine.   chlorpheniramine (CHLOR-TRIMETON) 4 MG tablet Take 1 tablet (4 mg total) by mouth at bedtime as needed for allergies or rhinitis.   Cholecalciferol (VITAMIN D3) 50 MCG (2000 UT) TABS Take 1 tablet by mouth daily.   Coenzyme Q10 (COQ10 PO) Take by mouth daily.   conjugated estrogens (PREMARIN) vaginal cream Place vaginally 2 (two) times a week. INSERT 0.5 APPLICATORS FULL VAGINALLY TWICE PER WEEK. AT BEDTIME.   cyanocobalamin (VITAMIN B12) 1000 MCG/ML injection Inject 1 mL (1,000 mcg total) into the muscle every 30 (thirty) days.   cycloSPORINE (RESTASIS) 0.05 % ophthalmic emulsion Place 1 drop into both eyes 2 (two) times daily.   desonide (DESOWEN) 0.05 % cream Apply topically as needed.   diazepam (VALIUM) 5 MG tablet Take 1 tablet (5 mg total) by mouth every 6 (six) hours as needed for anxiety.   Docusate Calcium (STOOL SOFTENER PO) Take 1 tablet by mouth as needed.   EPINEPHrine 0.3 mg/0.3 mL IJ SOAJ injection 0.3 mg by injection route.   estradiol (VIVELLE-DOT) 0.1 MG/24HR patch Place 1  patch (0.1 mg total) onto the skin 2 (two) times a week.   famotidine (PEPCID) 20 MG tablet Take 1 tablet (20 mg total) by mouth daily as needed for heartburn or indigestion.   fexofenadine (ALLEGRA ALLERGY) 180 MG tablet Take 1 tablet (180 mg total) by mouth daily.   fluticasone (FLONASE) 50 MCG/ACT nasal spray Place 1 spray into both nostrils daily.   glucose blood (ONETOUCH VERIO) test strip Used to check blood sugars 3 times daily.   hydrocortisone (ANUSOL-HC) 2.5 % rectal cream Place 1 Application rectally 2 (two) times daily. For 10 days   ipratropium (ATROVENT) 0.03 % nasal spray Place 2 sprays into both nostrils 3  (three) times daily as needed for rhinitis.   levothyroxine (SYNTHROID) 50 MCG tablet Take 1 tablet (50 mcg total) by mouth daily before breakfast.   losartan (COZAAR) 50 MG tablet TAKE 1 TABLET BY MOUTH EVERYDAY AT BEDTIME   MAGNESIUM PO Take 250 mg by mouth daily.   meclizine (ANTIVERT) 25 MG tablet Take 1 tablet (25 mg total) by mouth 3 (three) times daily as needed (vertigo).   metoprolol succinate (TOPROL-XL) 50 MG 24 hr tablet TAKE 1 TABLET BY MOUTH EVERY DAY   montelukast (SINGULAIR) 10 MG tablet Take 1 tablet (10 mg total) by mouth at bedtime.   Multiple Vitamin (MULTIVITAMIN) tablet Take 1 tablet by mouth daily.   mupirocin ointment (BACTROBAN) 2 % Apply topically as needed.   ondansetron (ZOFRAN ODT) 4 MG disintegrating tablet Take 1 tablet (4 mg total) by mouth every 8 (eight) hours as needed for nausea or vomiting. (Patient taking differently: Take 4 mg by mouth every 8 (eight) hours as needed for nausea or vomiting. Nausea and vertigo)   OneTouch Delica Lancets 33G MISC Used to check blood sugars twice daly.   Propylene Glycol 0.6 % SOLN Apply 1 drop to eye as needed.   Spacer/Aero-Holding Chambers (AEROCHAMBER MV) inhaler Use as instructed   Syringe/Needle, Disp, (SYRINGE 3CC/20GX1") 20G X 1" 3 ML MISC 1 Syringe by Does not apply route every 30 (thirty) days. For use with b-12 injection monthly   Tiotropium Bromide Monohydrate (SPIRIVA RESPIMAT) 1.25 MCG/ACT AERS Inhale 3-4 puffs into the lungs daily.   Tiotropium Bromide Monohydrate (SPIRIVA RESPIMAT) 1.25 MCG/ACT AERS Inhale 2 puffs into the lungs daily.   tretinoin (RETIN-A) 0.05 % cream Apply topically at bedtime.   urea (CARMOL) 40 % CREA Apply topically.   Wheat Dextrin (BENEFIBER DRINK MIX PO) Take by mouth. Use as directed   [DISCONTINUED] AMBULATORY NON FORMULARY MEDICATION Medication Name: Diltiazem 2%/Lidocaine 2%   Using your index finger apply a small amount of medication inside the anal opening and to the external  anal area twice daily x 6 weeks. (Patient not taking: Reported on 06/01/2023)   [DISCONTINUED] psyllium (METAMUCIL) 58.6 % powder Take 1 packet by mouth as needed. (Patient not taking: Reported on 06/01/2023)   No facility-administered encounter medications on file as of 06/01/2023.    Allergies (verified) Codeine, Olodaterol, Qvar [beclomethasone], and Sulfonamide derivatives   History: Past Medical History:  Diagnosis Date   Agatston coronary artery calcium score less than 100    a. 06/2018 Cardiac CT: Ca2+ = 3 (49th %'ile).   Allergic rhinitis 06/09/2008   chronic   Allergy    Aortic atherosclerosis (HCC)    a. 06/2018 noted on chest CT.   Arthritis    Asthma, intrinsic 10/06/2011   controlled on qvar. Cleda Daub 09/2011:  Very mild airflow obstruction (FEV1% 68, FVL  c/w obstruction)    Basal cell carcinoma 06/10/2021   right prox nasal ala rim - MOHs 07/12/21 Dr. Adriana Simas   Cataract    Chronic headache 06/09/2008   COPD (chronic obstructive pulmonary disease) (HCC)    Depression    Diverticulosis 2013   h/o diverticulitis   Dry eyes    GERD (gastroesophageal reflux disease)    Heart murmur    History of anal fissures    x2   History of breast implant removal    silicone mastitis - R axilla silicone LN, free silicone L breast upper outer quadrant   Hot flashes    HYPERLIPIDEMIA 06/09/2008   HYPERTENSION 06/09/2008   Hypothyroidism    MVP (mitral valve prolapse)    OSA on CPAP    [name]   PAF (paroxysmal atrial fibrillation) (HCC) 06/09/2008   a. CHA2DS2VASc = 3-->prefers ASA.   Pernicious anemia    per prior pcp   Rosacea    Sleep apnea    Surgical menopause 1991   Vertigo    Vitamin D deficiency    Past Surgical History:  Procedure Laterality Date   APPENDECTOMY  2007   bladder tack     bladder tack  08/2022   additional 12/24   BREAST BIOPSY Left 09/27/2009   BREAST ENHANCEMENT SURGERY  2006   and removal   CARDIOVASCULAR STRESS TEST  2013   [place] Dr. Dulce Sellar -  WNL, EF 55-60%, with 0 calcium score   CESAREAN SECTION     CHOLECYSTECTOMY  2007   biliary dyskinesia   COLONOSCOPY  08/2012   diverticulosis   COLONOSCOPY  2006   inflamed hyperplastic polyp   ESOPHAGOGASTRODUODENOSCOPY  08/2012   esophageal reflux   ESOPHAGOGASTRODUODENOSCOPY  11/02/2008   Dr Chales Abrahams Minimal gastritis. Irregular Z line suggestive of gastroesophageal reflux (biopsied to rule out short-segment Barrett's esophagus) Incidental gastric polyps   HERNIA REPAIR     NASAL SINUS SURGERY     RECTOCELE REPAIR  12/2008   Dr. Thamas Jaegers   sleep study  08/2007   OSA, 12 cm H2O,   TOTAL ABDOMINAL HYSTERECTOMY  1991   heavy bleeding, ovaries removed   TUBAL LIGATION     tummy tuck     US ECHOCARDIOGRAPHY  09/2009   impaired relaxation, mild tric regurg, normal MV, EF 60%   US ECHOCARDIOGRAPHY  11/2007   mild-mod MR   Family History  Problem Relation Age of Onset   Cancer Mother 42       lung, passive smoke   Cancer Father 62       lung, smoker   Colon polyps Sister    COPD Sister    Stroke Sister        TIA   Hypertension Sister    Heart failure Sister    Aortic dissection Maternal Aunt    Breast cancer Maternal Aunt    Cancer Paternal Aunt        breast, lung   Aortic dissection Paternal Uncle    Heart attack Paternal Uncle    AAA (abdominal aortic aneurysm) Paternal Uncle    Heart disease Paternal Uncle    Stroke Paternal Uncle    Cancer Cousin        breast   Colon cancer Neg Hx    Rectal cancer Neg Hx    Stomach cancer Neg Hx    Esophageal cancer Neg Hx    Social History   Socioeconomic History   Marital status: Married  Spouse name: Not on file   Number of children: Not on file   Years of education: Not on file   Highest education level: Not on file  Occupational History   Occupation: retired  Tobacco Use   Smoking status: Former    Current packs/day: 0.00    Average packs/day: 0.3 packs/day for 7.0 years (2.1 ttl pk-yrs)    Types: Cigarettes     Start date: 10/27/1970    Quit date: 10/27/1977    Years since quitting: 45.6    Passive exposure: Past   Smokeless tobacco: Never   Tobacco comments:    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 Hx of children who are grown  Vaping Use   Vaping status: Never Used  Substance and Sexual Activity   Alcohol use: Not Currently   Drug use: No   Sexual activity: Not on file  Other Topics Concern   Not on file  Social History Narrative   Lives with husband    Grown children   Occ: retired, was Research scientist (medical)   Edu: trade degree then 2 yrscollege   Social Determinants of Health   Financial Resource Strain: Low Risk  (06/01/2023)   Overall Financial Resource Strain (CARDIA)    Difficulty of Paying Living Expenses: Not hard at all  Food Insecurity: No Food Insecurity (06/01/2023)   Hunger Vital Sign    Worried About Running Out of Food in the Last Year: Never true    Ran Out of Food in the Last Year: Never true  Transportation Needs: No Transportation Needs (06/01/2023)   PRAPARE - Administrator, Civil Service (Medical): No    Lack of Transportation (Non-Medical): No  Physical Activity: Insufficiently Active (06/01/2023)   Exercise Vital Sign    Days of Exercise per Week: 3 days    Minutes of Exercise per Session: 20 min  Stress: No Stress Concern Present (06/01/2023)   [employer] of Occupational Health - Occupational Stress Questionnaire    Feeling of Stress : Only a little  Social Connections: Moderately Isolated (06/01/2023)   Social Connection and Isolation Panel [NHANES]    Frequency of Communication with Friends and Family: More than three times a week    Frequency of Social Gatherings with Friends and Family: More than three times a week    Attends Religious Services: Never    Database administrator or Organizations: No    Attends Banker Meetings: Never    Marital Status: Married    Tobacco Counseling Counseling given: Not  Answered Tobacco comments: 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 bring in CBG monitor from home?: No  How often do you need to have someone help you when you read instructions, pamphlets, or other written materials from your doctor or pharmacy?: 1 - Never  Interpreter Needed?: No  Information entered by :: R. Kaydence Menard LPN   Activities of Daily Living    06/01/2023   10:08 AM  In your present state of health, do you have any difficulty performing the following activities:  Hearing? 0  Vision? 0  Comment glasses, has dry eyes  Difficulty concentrating or making decisions? 0  Walking or climbing stairs? 0  Dressing or bathing?  0  Doing errands, shopping? 0  Preparing Food and eating ? N  Using the Toilet? N  In the past six months, have you accidently leaked urine? N  Do you have problems with loss of bowel control? N  Managing your Medications? N  Managing your Finances? N  Housekeeping or managing your Housekeeping? N    Patient Care Team: Sherlene Shams, MD as PCP - General (Internal Medicine) Antonieta Iba, MD as PCP - Cardiology (Cardiology)  Indicate any recent Medical Services you may have received from other than [facility] providers in the past year (date may be approximate).     Assessment:   This is a routine wellness examination for Angela Mendoza.  Hearing/Vision screen Hearing Screening - Comments:: No issues Vision Screening - Comments:: Has dry eyes, scheduled to see a doctor at [facility]  Dietary issues and exercise activities discussed:     Goals Addressed             This Visit's Progress    Patient Stated       Wants to lose weight and be a little more active       Depression  Screen    06/01/2023   10:22 AM 01/19/2023   12:12 PM 01/06/2023    1:28 PM 11/19/2022    2:09 PM 07/18/2022   10:33 AM 07/14/2022    1:38 PM 07/08/2022   11:12 AM  PHQ 2/9 Scores  PHQ - 2 Score 0 0 0 0 0 0 0  PHQ- 9 Score 1    0  0    Fall Risk    06/01/2023   10:10 AM 01/19/2023   12:12 PM 01/06/2023    1:28 PM 11/19/2022    2:09 PM 07/18/2022   10:33 AM  Fall Risk   Falls in the past year? 0 0 0 0 0  Number falls in past yr: 0 0 0 0 0  Injury with Fall? 0 0 0 0 0  Risk for fall due to : No Fall Risks No Fall Risks No Fall Risks No Fall Risks No Fall Risks  Follow up Falls prevention discussed;Falls evaluation completed Falls evaluation completed Falls evaluation completed Falls evaluation completed Falls evaluation completed    MEDICARE RISK AT HOME:  Medicare Risk at Home - 06/01/23 1010     Any stairs in or around the home? No    If so, are there any without handrails? No    Home free of loose throw rugs in walkways, pet beds, electrical cords, etc? Yes    Adequate lighting in your home to reduce risk of falls? Yes    Life alert? No    Use of a cane, walker or w/c? No    Grab bars in the bathroom? Yes    Shower chair or bench in shower? Yes    Elevated toilet seat or a handicapped toilet? Yes              Cognitive Function:    05/23/2020   11:28 AM 05/20/2018   11:04 AM  MMSE - Mini Mental State Exam  Not completed: Unable to complete   Orientation to time  5  Orientation to Place  5  Registration  3  Attention/ Calculation  5  Recall  3  Language- name 2 objects  2  Language- repeat  1  Language- follow 3 step command  3  Language- read & follow direction  1  Write a sentence  1  Copy design  1  Total score  30        06/01/2023   10:29 AM 05/23/2019   10:31 AM  6CIT Screen  What Year?  0 points  What month?  0 points  What time? 0 points 0 points  Count back from 20 0 points 0 points  Months in reverse 0 points 0 points  Repeat phrase 0 points 0  points  Total Score  0 points    Immunizations Immunization History  Administered Date(s) Administered   Covid-19, Mrna,Vaccine(Spikevax)72yrs and older 10/28/2022   Fluad Quad(high Dose 65+) 07/16/2019, 07/20/2020, 08/05/2021   Influenza Split 07/15/2013, 07/12/2015, 07/14/2016, 08/04/2017   Influenza Whole 07/27/2009, 07/28/2011, 07/21/2012   Influenza, High Dose Seasonal PF 07/22/2017, 07/01/2018, 08/15/2022   Influenza,inj,Quad PF,6+ Mos 08/03/2014, 07/12/2015, 07/14/2016   Influenza-Unspecified 07/15/2013, 08/03/2014, 07/12/2015, 07/14/2016, 08/04/2017   Moderna SARS-COV2 Booster Vaccination 08/17/2020, 02/01/2021   Moderna Sars-Covid-2 Vaccination 12/11/2019, 01/08/2020, 07/25/2021   Pneumococcal Conjugate-13 11/08/2014   Pneumococcal Polysaccharide-23 07/27/2009, 12/27/2015   Respiratory Syncytial Virus Vaccine,Recomb Aduvanted(Arexvy) 08/14/2022   Td 10/27/2008   Tdap 12/19/2013, 02/25/2017   Tetanus 10/27/2008   Zoster Recombinant(Shingrix) 04/27/2020, 11/26/2020   Zoster, Live 10/27/2009    TDAP status: Up to date  Flu Vaccine status: Up to date  Pneumococcal vaccine status: Up to date  Covid-19 vaccine status: Completed vaccines  Qualifies for Shingles Vaccine? Yes   Zostavax completed Yes   Shingrix Completed?: Yes  Screening Tests Health Maintenance  Topic Date Due   OPHTHALMOLOGY EXAM  Never done   COVID-19 Vaccine (5 - 2023-24 season) 12/23/2022   Medicare Annual Wellness (AWV)  05/29/2023   Diabetic kidney evaluation - Urine ACR  05/31/2023   HEMOGLOBIN A1C  05/20/2023   INFLUENZA VACCINE  05/28/2023   FOOT EXAM  07/05/2023   Diabetic kidney evaluation - eGFR measurement  05/03/2024   MAMMOGRAM  05/19/2024   DTaP/Tdap/Td (5 - Td or Tdap) 02/26/2027   Colonoscopy  08/22/2032   Pneumonia Vaccine 40+ Years old  Completed   DEXA SCAN  Completed   Hepatitis C Screening  Completed   Zoster Vaccines- Shingrix  Completed   HPV VACCINES  Aged Out     Health Maintenance  Health Maintenance Due  Topic Date Due   OPHTHALMOLOGY EXAM  Never done   COVID-19 Vaccine (5 - 2023-24 season) 12/23/2022   Medicare Annual Wellness (AWV)  05/29/2023   Diabetic kidney evaluation - Urine ACR  05/31/2023   HEMOGLOBIN A1C  05/20/2023   INFLUENZA VACCINE  05/28/2023    Colorectal cancer screening: Type of screening: Colonoscopy. Completed 10/23. Repeat every 10 years Patient is going to discuss with GI doctor to have it sooner  Mammogram status: Completed 7/24. Repeat every year  Bone Density status: Completed 8/22. Results reflect: Bone density results: NORMAL. Repeat every 2 years.Patient will discuss with PCP next week  Lung Cancer Screening: (Low Dose CT Chest recommended if Age 31-80 years, 20 pack-year currently smoking OR have quit w/in 15years.) does not qualify.    Additional Screening:  Hepatitis C Screening: does qualify; Completed 8/16  Vision Screening: Recommended annual ophthalmology exams for early detection of glaucoma and other disorders of the eye. Is the patient up to date with their annual eye exam?  Yes  Who is the provider or what is the name of the office in which the patient attends annual eye exams? [facility]  Patient thinks that she did have a diabetic eye exam and  will have them send the report If pt is not established with a provider, would they like to be referred to a provider to establish care? No .   Dental Screening: Recommended annual dental exams for proper oral hygiene  Diabetic Foot Exam: Diabetic Foot Exam: Completed 06/2022  Community Resource Referral / Chronic Care Management: CRR required this visit?  No   CCM required this visit?  No     Plan:     I have personally reviewed and noted the following in the patient's chart:   Medical and social history Use of alcohol, tobacco or illicit drugs  Current medications and supplements including opioid prescriptions. Patient is not  currently taking opioid prescriptions. Functional ability and status Nutritional status Physical activity Advanced directives List of other physicians Hospitalizations, surgeries, and ER visits in previous 12 months Vitals Screenings to include cognitive, depression, and falls Referrals and appointments  In addition, I have reviewed and discussed with patient certain preventive protocols, quality metrics, and best practice recommendations. A written personalized care plan for preventive services as well as general preventive health recommendations were provided to patient.     Sydell Axon, LPN   11/01/1094   After Visit Summary: (MyChart) Due to this being a telephonic visit, the after visit summary with patients personalized plan was offered to patient via MyChart   Nurse Notes: None    I have reviewed the above information and agree with above.   Duncan Dull, MD

## 2023-06-01 NOTE — Patient Instructions (Signed)
[name] , Thank you for taking time to come for your Medicare Wellness Visit. I appreciate your ongoing commitment to your health goals. Please review the following plan we discussed and let me know if I can assist you in the future.   Referrals/Orders/Follow-Ups/Clinician Recommendations: None  This is a list of the screening recommended for you and due dates:  Health Maintenance  Topic Date Due   Eye exam for diabetics  Never done   COVID-19 Vaccine (5 - 2023-24 season) 12/23/2022   Yearly kidney health urinalysis for diabetes  05/31/2023   Hemoglobin A1C  05/20/2023   Flu Shot  05/28/2023   Complete foot exam   07/05/2023   Yearly kidney function blood test for diabetes  05/03/2024   Mammogram  05/19/2024   Medicare Annual Wellness Visit  05/31/2024   DTaP/Tdap/Td vaccine (5 - Td or Tdap) 02/26/2027   Colon Cancer Screening  08/22/2032   Pneumonia Vaccine  Completed   DEXA scan (bone density measurement)  Completed   Hepatitis C Screening  Completed   Zoster (Shingles) Vaccine  Completed   HPV Vaccine  Aged Out    Advanced directives: (Copy Requested) Please bring a copy of your health care power of attorney and living will to the office to be added to your chart at your convenience.  Next Medicare Annual Wellness Visit scheduled for next year: Yes 06/01/24 @ 10:30  Preventive Care 65 Years and Older, Female Preventive care refers to lifestyle choices and visits with your health care provider that can promote health and wellness. What does preventive care include? A yearly physical exam. This is also called an annual well check. Dental exams once or twice a year. Routine eye exams. Ask your health care provider how often you should have your eyes checked. Personal lifestyle choices, including: Daily care of your teeth and gums. Regular physical activity. Eating a healthy diet. Avoiding tobacco and drug use. Limiting alcohol use. Practicing safe sex. Taking low-dose  aspirin every day. Taking vitamin and mineral supplements as recommended by your health care provider. What happens during an annual well check? The services and screenings done by your health care provider during your annual well check will depend on your age, overall health, lifestyle risk factors, and family history of disease. Counseling  Your health care provider may ask you questions about your: Alcohol use. Tobacco use. Drug use. Emotional well-being. Home and relationship well-being. Sexual activity. Eating habits. History of falls. Memory and ability to understand (cognition). Work and work Astronomer. Reproductive health. Screening  You may have the following tests or measurements: Height, weight, and BMI. Blood pressure. Lipid and cholesterol levels. These may be checked every 5 years, or more frequently if you are over 6 years old. Skin check. Lung cancer screening. You may have this screening every year starting at age 51 if you have a 30-pack-year history of smoking and currently smoke or have quit within the past 15 years. Fecal occult blood test (FOBT) of the stool. You may have this test every year starting at age 5. Flexible sigmoidoscopy or colonoscopy. You may have a sigmoidoscopy every 5 years or a colonoscopy every 10 years starting at age 71. Hepatitis C blood test. Hepatitis B blood test. Sexually transmitted disease (STD) testing. Diabetes screening. This is done by checking your blood sugar (glucose) after you have not eaten for a while (fasting). You may have this done every 1-3 years. Bone density scan. This is done to screen for osteoporosis. You  may have this done starting at age 15. Mammogram. This may be done every 1-2 years. Talk to your health care provider about how often you should have regular mammograms. Talk with your health care provider about your test results, treatment options, and if necessary, the need for more tests. Vaccines  Your  health care provider may recommend certain vaccines, such as: Influenza vaccine. This is recommended every year. Tetanus, diphtheria, and acellular pertussis (Tdap, Td) vaccine. You may need a Td booster every 10 years. Zoster vaccine. You may need this after age 68. Pneumococcal 13-valent conjugate (PCV13) vaccine. One dose is recommended after age 48. Pneumococcal polysaccharide (PPSV23) vaccine. One dose is recommended after age 33. Talk to your health care provider about which screenings and vaccines you need and how often you need them. This information is not intended to replace advice given to you by your health care provider. Make sure you discuss any questions you have with your health care provider. Document Released: 11/09/2015 Document Revised: 07/02/2016 Document Reviewed: 08/14/2015 Elsevier Interactive Patient Education  2017 [employer].  Fall Prevention in the Home Falls can cause injuries. They can happen to people of all ages. There are many things you can do to make your home safe and to help prevent falls. What can I do on the outside of my home? Regularly fix the edges of walkways and driveways and fix any cracks. Remove anything that might make you trip as you walk through a door, such as a raised step or threshold. Trim any bushes or trees on the path to your home. Use bright outdoor lighting. Clear any walking paths of anything that might make someone trip, such as rocks or tools. Regularly check to see if handrails are loose or broken. Make sure that both sides of any steps have handrails. Any raised decks and porches should have guardrails on the edges. Have any leaves, snow, or ice cleared regularly. Use sand or salt on walking paths during winter. Clean up any spills in your garage right away. This includes oil or grease spills. What can I do in the bathroom? Use night lights. Install grab bars by the toilet and in the tub and shower. Do not use towel bars as  grab bars. Use non-skid mats or decals in the tub or shower. If you need to sit down in the shower, use a plastic, non-slip stool. Keep the floor dry. Clean up any water that spills on the floor as soon as it happens. Remove soap buildup in the tub or shower regularly. Attach bath mats securely with double-sided non-slip rug tape. Do not have throw rugs and other things on the floor that can make you trip. What can I do in the bedroom? Use night lights. Make sure that you have a light by your bed that is easy to reach. Do not use any sheets or blankets that are too big for your bed. They should not hang down onto the floor. Have a firm chair that has side arms. You can use this for support while you get dressed. Do not have throw rugs and other things on the floor that can make you trip. What can I do in the kitchen? Clean up any spills right away. Avoid walking on wet floors. Keep items that you use a lot in easy-to-reach places. If you need to reach something above you, use a strong step stool that has a grab bar. Keep electrical cords out of the way. Do not use floor  polish or wax that makes floors slippery. If you must use wax, use non-skid floor wax. Do not have throw rugs and other things on the floor that can make you trip. What can I do with my stairs? Do not leave any items on the stairs. Make sure that there are handrails on both sides of the stairs and use them. Fix handrails that are broken or loose. Make sure that handrails are as long as the stairways. Check any carpeting to make sure that it is firmly attached to the stairs. Fix any carpet that is loose or worn. Avoid having throw rugs at the top or bottom of the stairs. If you do have throw rugs, attach them to the floor with carpet tape. Make sure that you have a light switch at the top of the stairs and the bottom of the stairs. If you do not have them, ask someone to add them for you. What else can I do to help prevent  falls? Wear shoes that: Do not have high heels. Have rubber bottoms. Are comfortable and fit you well. Are closed at the toe. Do not wear sandals. If you use a stepladder: Make sure that it is fully opened. Do not climb a closed stepladder. Make sure that both sides of the stepladder are locked into place. Ask someone to hold it for you, if possible. Clearly mark and make sure that you can see: Any grab bars or handrails. First and last steps. Where the edge of each step is. Use tools that help you move around (mobility aids) if they are needed. These include: Canes. Walkers. Scooters. Crutches. Turn on the lights when you go into a dark area. Replace any light bulbs as soon as they burn out. Set up your furniture so you have a clear path. Avoid moving your furniture around. If any of your floors are uneven, fix them. If there are any pets around you, be aware of where they are. Review your medicines with your doctor. Some medicines can make you feel dizzy. This can increase your chance of falling. Ask your doctor what other things that you can do to help prevent falls. This information is not intended to replace advice given to you by your health care provider. Make sure you discuss any questions you have with your health care provider. Document Released: 08/09/2009 Document Revised: 03/20/2016 Document Reviewed: 11/17/2014 Elsevier Interactive Patient Education  2017 [employer].

## 2023-06-04 ENCOUNTER — Encounter: Payer: Medicare Other | Admitting: Physical Therapy

## 2023-06-08 ENCOUNTER — Ambulatory Visit: Payer: Medicare Other | Admitting: Internal Medicine

## 2023-06-09 DIAGNOSIS — N6012 Diffuse cystic mastopathy of left breast: Secondary | ICD-10-CM | POA: Diagnosis not present

## 2023-06-09 DIAGNOSIS — N6011 Diffuse cystic mastopathy of right breast: Secondary | ICD-10-CM | POA: Diagnosis not present

## 2023-06-10 ENCOUNTER — Ambulatory Visit: Payer: Medicare Other | Admitting: Internal Medicine

## 2023-06-18 ENCOUNTER — Ambulatory Visit (INDEPENDENT_AMBULATORY_CARE_PROVIDER_SITE_OTHER): Payer: Medicare Other | Admitting: Pulmonary Disease

## 2023-06-18 ENCOUNTER — Encounter: Payer: Medicare Other | Admitting: Physical Therapy

## 2023-06-18 ENCOUNTER — Encounter: Payer: Self-pay | Admitting: Pulmonary Disease

## 2023-06-18 VITALS — BP 124/70 | HR 76 | Temp 97.7°F | Ht 65.0 in | Wt 176.6 lb

## 2023-06-18 DIAGNOSIS — G4733 Obstructive sleep apnea (adult) (pediatric): Secondary | ICD-10-CM

## 2023-06-18 DIAGNOSIS — J019 Acute sinusitis, unspecified: Secondary | ICD-10-CM

## 2023-06-18 DIAGNOSIS — J4551 Severe persistent asthma with (acute) exacerbation: Secondary | ICD-10-CM | POA: Diagnosis not present

## 2023-06-18 HISTORY — PX: TRIGGER FINGER RELEASE: SHX641

## 2023-06-18 MED ORDER — AZELASTINE HCL 0.1 % NA SOLN
2.0000 | Freq: Two times a day (BID) | NASAL | 3 refills | Status: DC
Start: 2023-06-18 — End: 2023-09-17

## 2023-06-18 MED ORDER — IPRATROPIUM BROMIDE 0.03 % NA SOLN: 2.0000 | Freq: Three times a day (TID) | NASAL | 3 refills | Status: DC | PRN

## 2023-06-18 MED ORDER — MONTELUKAST SODIUM 10 MG PO TABS: 10.0000 mg | ORAL_TABLET | Freq: Every day | ORAL | 3 refills | Status: DC

## 2023-06-18 MED ORDER — FLUTICASONE PROPIONATE 50 MCG/ACT NA SUSP: 1.0000 | Freq: Every day | NASAL | 3 refills | Status: DC

## 2023-06-18 NOTE — Progress Notes (Signed)
[facility]  Chief Complaint  Patient presents with   Follow-up    Wearing cpap nightly- pressure and mask is okay. No concerns.     Constitutional:  BP 124/70 (BP Location: Left Arm, Cuff Size: Normal)   Pulse 76   Temp 97.7 F (36.5 C) (Temporal)   Ht 5\' 5"  (1.651 m)   Wt 176 lb 9.6 oz (80.1 kg)   SpO2 97%   BMI 29.39 kg/m   Past Medical History:  Vit D deficiency, Vertigo, Rosacea, Hypothyroidism, HTN, HLD, GERD, Diverticulosis, Depression, HA, COVID 12 February 2021  Past Surgical History:  She  has a past surgical history that includes Cholecystectomy (2007); Appendectomy (2007); Tubal ligation; Total abdominal hysterectomy (1991); Nasal sinus surgery; Breast enhancement surgery (2006); Cardiovascular stress test (2013); Colonoscopy (08/2012); Esophagogastroduodenoscopy (08/2012); US ECHOCARDIOGRAPHY (09/2009); US ECHOCARDIOGRAPHY (11/2007); sleep study (08/2007); Rectocele repair (12/2008); Colonoscopy (2006); Breast biopsy (Left, 09/27/2009); Hernia repair; bladder tack; tummy tuck; Esophagogastroduodenoscopy (11/02/2008); Cesarean section; and bladder tack (08/2022).  Brief Summary:  Angela Mendoza is a 72 y.o. female former smoker with OSA and asthma.      Subjective:   She had release of Rt hand trigger finger this morning.  Breathing has been doing well.  Not having cough, wheeze, or sputum.  Not having sinus congestion or sore throat.  Sleeping well with CPAP.  No issues with mask fit.  She will be moving to [place] in 2025 to be closer to family.  She isn't sure when in 2025 she will move.  Physical Exam:   Appearance - well kempt   ENMT - no sinus tenderness, no oral exudate, no LAN, Mallampati 3 airway, no stridor  Respiratory - equal breath sounds bilaterally, no wheezing or rales  CV - s1s2 regular rate and rhythm, no murmurs  Ext - no clubbing, no edema  Skin - no rashes  Psych - normal mood and  affect        Pulmonary testing:  Spirometry 12/12 >> FEV1% 68 RAST 11/15/14 >> IgE 30, cats/mold PFT 02/07/16 >> FEV1 2.32 (92%), FEV1% 76, FEF 25-75% 1.56 (71%), TLC 4.25 (81%), DLCO 121, borderline BD from FEF 25-75 FeNO 12/01/18 >> 7 IgE 12/16/21 >> 23  Chest Imaging:  Coronary CT 04/04/21 >> stable 2 mm nodule LLL, coronary calcium score 0 CT sinus 01/09/22 >> normal CT angio chest 07/18/22 >> mild dependent atelectasis  Sleep Tests:  PSG 09/06/07 >> AHI 19 CPAP 05/17/23 to 06/15/23 >> used on 30 of 30 nights with average 7 hrs 15 min.  Average AHI 0.7 with median CPAP 6 and 95 th percentile CPAP 8 cm H2O  Cardiac Tests:  Echo 01/23/21 >> EF 60 to 65%, grade 2 DD, RVSP 39.1 mmHg, mild MR  Social History:  She  reports that she quit smoking about 45 years ago. Her smoking use included cigarettes. She started smoking about 52 years ago. She has a 2.1 pack-year smoking history. She has been exposed to tobacco smoke. She has never used smokeless tobacco. She reports that she does not currently use alcohol. She reports that she does not use drugs.  Family History:  Her family history includes AAA (abdominal aortic aneurysm) in her paternal uncle; Aortic dissection in her maternal aunt and paternal uncle; Breast cancer in her maternal aunt; COPD in her sister; Cancer in her cousin and paternal aunt; Cancer (age of onset: 71) in her father; Cancer (age of onset: 41) in her mother; Colon  polyps in her sister; Heart attack in her paternal uncle; Heart disease in her paternal uncle; Heart failure in her sister; Hypertension in her sister; Stroke in her paternal uncle and sister.     Assessment/Plan:   Allergic asthma. - LABA's cause palpitations, and ICS's cause thrush - continue spiriva 1.25 two puffs daily, singulair 10 mg nightly - prn albuterol - provider her with a spacer device - she gets scripts through [facility], 90 day supply  Frequent episodes of thrush. - from ICS inhalers and  prednisone; better since these have been eliminated/avoided - nystatin doesn't clear her infection; usually needs diflucan   Upper airway cough with allergic rhinitis. - dysmista caused funny taste - continue allegra, azelastine, and singulair - prn chlorpheniramine, tessalon, atrovent nasal spray   Obstructive sleep apnea. - she is compliant with CPAP and reports benefit from therapy - uses [employer] for her DME - current CPAP ordered May 2022 - continue auto CPAP 5 to 10 cm H2O  Time Spent Involved in Patient Care on Day of Examination:  26 minutes  Follow up:   Patient Instructions  Follow up in 4 months  Medication List:   Allergies as of 06/18/2023       Reactions   Codeine Other (See Comments)   Low blood pressure/nausea   Olodaterol Palpitations   Qvar [beclomethasone] Other (See Comments)   Thrush per patient even with rinsing.    Sulfonamide Derivatives Rash   rash        Medication List        Accurate as of June 18, 2023  5:12 PM. If you have any questions, ask your nurse or doctor.          AeroChamber MV inhaler Use as instructed   albuterol 108 (90 Base) MCG/ACT inhaler Commonly known as: Proventil HFA Inhale 2 puffs into the lungs every 6 (six) hours as needed.   ALPRAZolam 0.25 MG tablet Commonly known as: XANAX TAKE 1 TABLET (0.25 MG TOTAL) BY MOUTH ONCE DAILY AS NEEDED FOR SLEEP.   atorvastatin 40 MG tablet Commonly known as: LIPITOR Take 1 tablet (40 mg total) by mouth daily.   azelastine 0.1 % nasal spray Commonly known as: ASTELIN Place 2 sprays into both nostrils 2 (two) times daily. Use in each nostril as directed   BENEFIBER DRINK MIX PO Take by mouth. Use as directed   benzonatate 200 MG capsule Commonly known as: TESSALON Take 1 capsule (200 mg total) by mouth 3 (three) times daily as needed for cough.   chlorpheniramine 4 MG tablet Commonly known as: CHLOR-TRIMETON Take 1 tablet (4 mg total) by mouth at bedtime as  needed for allergies or rhinitis.   COQ10 PO Take by mouth daily.   cyanocobalamin 1000 MCG/ML injection Commonly known as: VITAMIN B12 Inject 1 mL (1,000 mcg total) into the muscle every 30 (thirty) days.   cycloSPORINE 0.05 % ophthalmic emulsion Commonly known as: RESTASIS Place 1 drop into both eyes 2 (two) times daily.   desonide 0.05 % cream Commonly known as: DESOWEN Apply topically as needed.   diazepam 5 MG tablet Commonly known as: VALIUM Take 1 tablet (5 mg total) by mouth every 6 (six) hours as needed for anxiety.   EPINEPHrine 0.3 mg/0.3 mL Soaj injection Commonly known as: EPI-PEN 0.3 mg by injection route.   estradiol 0.1 MG/24HR patch Commonly known as: VIVELLE-DOT Place 1 patch (0.1 mg total) onto the skin 2 (two) times a week.   famotidine 20  MG tablet Commonly known as: PEPCID Take 1 tablet (20 mg total) by mouth daily as needed for heartburn or indigestion.   fexofenadine 180 MG tablet Commonly known as: Allegra Allergy Take 1 tablet (180 mg total) by mouth daily.   fluticasone 50 MCG/ACT nasal spray Commonly known as: FLONASE Place 1 spray into both nostrils daily.   hydrocortisone 2.5 % rectal cream Commonly known as: ANUSOL-HC Place 1 Application rectally 2 (two) times daily. For 10 days   ipratropium 0.03 % nasal spray Commonly known as: ATROVENT Place 2 sprays into both nostrils 3 (three) times daily as needed for rhinitis.   levothyroxine 50 MCG tablet Commonly known as: Synthroid Take 1 tablet (50 mcg total) by mouth daily before breakfast.   losartan 50 MG tablet Commonly known as: COZAAR TAKE 1 TABLET BY MOUTH EVERYDAY AT BEDTIME   MAGNESIUM PO Take 250 mg by mouth daily.   meclizine 25 MG tablet Commonly known as: ANTIVERT Take 1 tablet (25 mg total) by mouth 3 (three) times daily as needed (vertigo).   metoprolol succinate 50 MG 24 hr tablet Commonly known as: TOPROL-XL TAKE 1 TABLET BY MOUTH EVERY DAY   montelukast 10  MG tablet Commonly known as: SINGULAIR Take 1 tablet (10 mg total) by mouth at bedtime.   multivitamin tablet Take 1 tablet by mouth daily.   mupirocin ointment 2 % Commonly known as: BACTROBAN Apply topically as needed.   ondansetron 4 MG disintegrating tablet Commonly known as: Zofran ODT Take 1 tablet (4 mg total) by mouth every 8 (eight) hours as needed for nausea or vomiting. What changed: additional instructions   OneTouch Delica Lancets 33G Misc Used to check blood sugars twice daly.   OneTouch Verio High Soln Used to check accuracy of blood glucose machine.   OneTouch Verio test strip Generic drug: glucose blood Used to check blood sugars 3 times daily.   Premarin vaginal cream Generic drug: conjugated estrogens Place vaginally 2 (two) times a week. INSERT 0.5 APPLICATORS FULL VAGINALLY TWICE PER WEEK. AT BEDTIME.   Propylene Glycol 0.6 % Soln Apply 1 drop to eye as needed.   Spiriva Respimat 1.25 MCG/ACT Aers Generic drug: Tiotropium Bromide Monohydrate Inhale 3-4 puffs into the lungs daily.   Spiriva Respimat 1.25 MCG/ACT Aers Generic drug: Tiotropium Bromide Monohydrate Inhale 2 puffs into the lungs daily.   STOOL SOFTENER PO Take 1 tablet by mouth as needed.   SYRINGE 3CC/20GX1" 20G X 1" 3 ML Misc 1 Syringe by Does not apply route every 30 (thirty) days. For use with b-12 injection monthly   tretinoin 0.05 % cream Commonly known as: RETIN-A Apply topically at bedtime.   urea 40 % Crea Commonly known as: CARMOL Apply topically.   Vitamin D3 50 MCG (2000 UT) Tabs Take 1 tablet by mouth daily.        Signature:  Coralyn Helling, MD [facility] Pager - [phone] 06/18/2023, 5:12 PM

## 2023-06-18 NOTE — Patient Instructions (Signed)
Follow up in 4 months 

## 2023-06-19 ENCOUNTER — Ambulatory Visit (INDEPENDENT_AMBULATORY_CARE_PROVIDER_SITE_OTHER): Payer: Medicare Other | Admitting: Internal Medicine

## 2023-06-19 ENCOUNTER — Encounter: Payer: Self-pay | Admitting: Internal Medicine

## 2023-06-19 VITALS — BP 120/74 | HR 80 | Temp 98.0°F | Ht 65.0 in | Wt 175.0 lb

## 2023-06-19 DIAGNOSIS — G8929 Other chronic pain: Secondary | ICD-10-CM

## 2023-06-19 DIAGNOSIS — E538 Deficiency of other specified B group vitamins: Secondary | ICD-10-CM

## 2023-06-19 DIAGNOSIS — E034 Atrophy of thyroid (acquired): Secondary | ICD-10-CM | POA: Diagnosis not present

## 2023-06-19 DIAGNOSIS — M65341 Trigger finger, right ring finger: Secondary | ICD-10-CM

## 2023-06-19 DIAGNOSIS — E559 Vitamin D deficiency, unspecified: Secondary | ICD-10-CM

## 2023-06-19 DIAGNOSIS — L989 Disorder of the skin and subcutaneous tissue, unspecified: Secondary | ICD-10-CM

## 2023-06-19 DIAGNOSIS — K2100 Gastro-esophageal reflux disease with esophagitis, without bleeding: Secondary | ICD-10-CM | POA: Diagnosis not present

## 2023-06-19 DIAGNOSIS — E611 Iron deficiency: Secondary | ICD-10-CM | POA: Diagnosis not present

## 2023-06-19 DIAGNOSIS — E1169 Type 2 diabetes mellitus with other specified complication: Secondary | ICD-10-CM

## 2023-06-19 DIAGNOSIS — B370 Candidal stomatitis: Secondary | ICD-10-CM | POA: Diagnosis not present

## 2023-06-19 DIAGNOSIS — J4540 Moderate persistent asthma, uncomplicated: Secondary | ICD-10-CM | POA: Diagnosis not present

## 2023-06-19 DIAGNOSIS — R5383 Other fatigue: Secondary | ICD-10-CM

## 2023-06-19 DIAGNOSIS — E785 Hyperlipidemia, unspecified: Secondary | ICD-10-CM | POA: Diagnosis not present

## 2023-06-19 DIAGNOSIS — G4733 Obstructive sleep apnea (adult) (pediatric): Secondary | ICD-10-CM

## 2023-06-19 DIAGNOSIS — J4550 Severe persistent asthma, uncomplicated: Secondary | ICD-10-CM | POA: Diagnosis not present

## 2023-06-19 DIAGNOSIS — D229 Melanocytic nevi, unspecified: Secondary | ICD-10-CM

## 2023-06-19 DIAGNOSIS — M7662 Achilles tendinitis, left leg: Secondary | ICD-10-CM

## 2023-06-19 MED ORDER — CYANOCOBALAMIN 1000 MCG/ML IJ SOLN: 1000.0000 ug | INTRAMUSCULAR | 3 refills | Status: DC

## 2023-06-19 MED ORDER — LEVOTHYROXINE SODIUM 50 MCG PO TABS: 50.0000 ug | ORAL_TABLET | Freq: Every day | ORAL | 3 refills | Status: DC

## 2023-06-19 MED ORDER — ESTRADIOL 0.1 MG/24HR TD PTTW: 1.0000 | MEDICATED_PATCH | TRANSDERMAL | 3 refills | Status: DC

## 2023-06-19 MED ORDER — FAMOTIDINE 20 MG PO TABS
20.0000 mg | ORAL_TABLET | Freq: Every day | ORAL | 3 refills | Status: DC | PRN
Start: 2023-06-19 — End: 2024-06-01

## 2023-06-19 MED ORDER — OMEGA-3-ACID ETHYL ESTERS 1 G PO CAPS: 1.0000 g | ORAL_CAPSULE | Freq: Two times a day (BID) | ORAL | 3 refills | Status: DC

## 2023-06-19 NOTE — Progress Notes (Unsigned)
Subjective:  Patient ID: Angela Mendoza, female    DOB: Apr 07, 1951  Age: 72 y.o. MRN: [id number]  CC: The primary encounter diagnosis was Moderate persistent asthma, unspecified whether complicated. Diagnoses of Gastroesophageal reflux disease with esophagitis, Atypical mole, Hypothyroidism due to acquired atrophy of thyroid, Vitamin D deficiency, Iron deficiency, Hyperlipidemia associated with type 2 diabetes mellitus (HCC), Heel pain, chronic, right, Chronic pain of right knee, Left Achilles tendinitis, B12 deficiency, Fatigue, unspecified type, OSA on CPAP, Skin lesion of back, Oral candidiasis, and Trigger finger, right ring finger were also pertinent to this visit.   HPI DORA HATHEWAY presents for  Chief Complaint  Patient presents with   Medical Management of Chronic Issues   Cc: fatigue:  with daily activities .  Sleeping well,  but no energy,  denies dyspnea , chest pain ,hyperxomnolence.     Had trigger finger release of right index and middle finger by Deeann Saint at [facility] yesterday  NO DISCHARGE INSTRUCTIONS GIVEN.  GIVEN DOXYCYCLINE RX.  TAKING A PROBIOTIC. [name]  FINGERS SWOLLEN .  GIVEN RX OR TRAMADOL BUT MULTIPLE FAMILY MEMBERS HAVE HAD BAD REACTION TO IT so she has avoided taking it .  Using a homemade sling to elevate hand c  Severe persistent asthma:  she denies any recent exacerbations . She is taking her rmeds as directed    Recently had a mechanical fall onto concrete , sustained an abration to left leg scrap now  healed .   Moving to  [facility]   in January,  looking forward to it bc  of the amenities (they will have a  pool ,  yoga, ) and her family across the street.(Husbands's twin sister)   Diabetes/obesity/HTN:  she has had an Intentional weight loss  through dietary restriction of sugar and portion control .  She is tolerating staitn and ARB.  Denies neuropathy and elevated/low BS   Left achilles pain has been  worse since podiatrist  Evans  inejcted achilles tendon several weeks ago.  The pain is discouraging her from exercising.  She is requesting a  2nd  opinion  Right knee pain:  sheis requesting a referral to [facility]   for persisent siffness and pain of right knee . She has no history of knee surgery,  but has had 2 falls onto knee I nthe past year  Outpatient Medications Prior to Visit  Medication Sig Dispense Refill   albuterol (PROVENTIL HFA) 108 (90 Base) MCG/ACT inhaler Inhale 2 puffs into the lungs every 6 (six) hours as needed. 18 g 2   ALPRAZolam (XANAX) 0.25 MG tablet TAKE 1 TABLET (0.25 MG TOTAL) BY MOUTH ONCE DAILY AS NEEDED FOR SLEEP. 30 tablet 5   atorvastatin (LIPITOR) 40 MG tablet Take 1 tablet (40 mg total) by mouth daily. 90 tablet 3   azelastine (ASTELIN) 0.1 % nasal spray Place 2 sprays into both nostrils 2 (two) times daily. Use in each nostril as directed 90 mL 3   benzonatate (TESSALON) 200 MG capsule Take 1 capsule (200 mg total) by mouth 3 (three) times daily as needed for cough. 30 capsule 1   Blood Glucose Calibration (ONETOUCH VERIO) High SOLN Used to check accuracy of blood glucose machine. 1 each 0   chlorpheniramine (CHLOR-TRIMETON) 4 MG tablet Take 1 tablet (4 mg total) by mouth at bedtime as needed for allergies or rhinitis. 90 tablet 1   Cholecalciferol (VITAMIN D3) 50 MCG (2000 UT) TABS Take 1 tablet  by mouth daily. 90 tablet 3   Coenzyme Q10 (COQ10 PO) Take by mouth daily.     conjugated estrogens (PREMARIN) vaginal cream Place vaginally 2 (two) times a week. INSERT 0.5 APPLICATORS FULL VAGINALLY TWICE PER WEEK. AT BEDTIME. 30 g 4   cycloSPORINE (RESTASIS) 0.05 % ophthalmic emulsion Place 1 drop into both eyes 2 (two) times daily. 3 each 3   desonide (DESOWEN) 0.05 % cream Apply topically as needed.     diazepam (VALIUM) 5 MG tablet Take 1 tablet (5 mg total) by mouth every 6 (six) hours as needed for anxiety. 30 tablet 0   Docusate Calcium (STOOL SOFTENER PO) Take 1 tablet by mouth as needed.      doxycycline (VIBRAMYCIN) 100 MG capsule Take 1 capsule twice a day by oral route.     EPINEPHrine 0.3 mg/0.3 mL IJ SOAJ injection 0.3 mg by injection route. 1 each 1   fexofenadine (ALLEGRA ALLERGY) 180 MG tablet Take 1 tablet (180 mg total) by mouth daily. 90 tablet 3   fluticasone (FLONASE) 50 MCG/ACT nasal spray Place 1 spray into both nostrils daily. 48 g 3   glucose blood (ONETOUCH VERIO) test strip Used to check blood sugars 3 times daily. 300 each 3   hydrocortisone (ANUSOL-HC) 2.5 % rectal cream Place 1 Application rectally 2 (two) times daily. For 10 days 30 g 4   ipratropium (ATROVENT) 0.03 % nasal spray Place 2 sprays into both nostrils 3 (three) times daily as needed for rhinitis. 90 mL 3   losartan (COZAAR) 50 MG tablet TAKE 1 TABLET BY MOUTH EVERYDAY AT BEDTIME 30 tablet 4   MAGNESIUM PO Take 250 mg by mouth daily.     meclizine (ANTIVERT) 25 MG tablet Take 1 tablet (25 mg total) by mouth 3 (three) times daily as needed (vertigo). 84 tablet 3   metoprolol succinate (TOPROL-XL) 50 MG 24 hr tablet TAKE 1 TABLET BY MOUTH EVERY DAY 30 tablet 4   montelukast (SINGULAIR) 10 MG tablet Take 1 tablet (10 mg total) by mouth at bedtime. 90 tablet 3   Multiple Vitamin (MULTIVITAMIN) tablet Take 1 tablet by mouth daily.     mupirocin ointment (BACTROBAN) 2 % Apply topically as needed.     ondansetron (ZOFRAN ODT) 4 MG disintegrating tablet Take 1 tablet (4 mg total) by mouth every 8 (eight) hours as needed for nausea or vomiting. (Patient taking differently: Take 4 mg by mouth every 8 (eight) hours as needed for nausea or vomiting. Nausea and vertigo) 30 tablet 2   OneTouch Delica Lancets 33G MISC Used to check blood sugars twice daly. 100 each 5   Propylene Glycol 0.6 % SOLN Apply 1 drop to eye as needed.     Spacer/Aero-Holding Chambers (AEROCHAMBER MV) inhaler Use as instructed 1 each 0   Syringe/Needle, Disp, (SYRINGE 3CC/20GX1") 20G X 1" 3 ML MISC 1 Syringe by Does not apply route every 30  (thirty) days. For use with b-12 injection monthly 50 each 0   Tiotropium Bromide Monohydrate (SPIRIVA RESPIMAT) 1.25 MCG/ACT AERS Inhale 3-4 puffs into the lungs daily. 12 g 10   Tiotropium Bromide Monohydrate (SPIRIVA RESPIMAT) 1.25 MCG/ACT AERS Inhale 2 puffs into the lungs daily.     tretinoin (RETIN-A) 0.05 % cream Apply topically at bedtime. 45 g 1   urea (CARMOL) 40 % CREA Apply topically.     Wheat Dextrin (BENEFIBER DRINK MIX PO) Take by mouth. Use as directed     cyanocobalamin (VITAMIN B12) 1000 MCG/ML  injection Inject 1 mL (1,000 mcg total) into the muscle every 30 (thirty) days. 3 mL 3   estradiol (VIVELLE-DOT) 0.1 MG/24HR patch Place 1 patch (0.1 mg total) onto the skin 2 (two) times a week. 24 patch 3   famotidine (PEPCID) 20 MG tablet Take 1 tablet (20 mg total) by mouth daily as needed for heartburn or indigestion. 90 tablet 3   levothyroxine (SYNTHROID) 50 MCG tablet Take 1 tablet (50 mcg total) by mouth daily before breakfast. 90 tablet 3   No facility-administered medications prior to visit.    Review of Systems;  Patient denies headache, fevers, malaise, unintentional weight loss, skin rash, eye pain, sinus congestion and sinus pain, sore throat, dysphagia,  hemoptysis , cough, dyspnea, wheezing, chest pain, palpitations, orthopnea, edema, abdominal pain, nausea, melena, diarrhea, constipation, flank pain, dysuria, hematuria, urinary  Frequency, nocturia, numbness, tingling, seizures,  Focal weakness, Loss of consciousness,  Tremor, insomnia, depression, anxiety, and suicidal ideation.      Objective:  BP 120/74   Pulse 80   Temp 98 F (36.7 C) (Oral)   Ht 5\' 5"  (1.651 m)   Wt 175 lb (79.4 kg)   SpO2 98%   BMI 29.12 kg/m   BP Readings from Last 3 Encounters:  06/19/23 120/74  06/18/23 124/70  05/01/23 130/76    Wt Readings from Last 3 Encounters:  06/19/23 175 lb (79.4 kg)  06/18/23 176 lb 9.6 oz (80.1 kg)  06/01/23 176 lb 8 oz (80.1 kg)    Physical  Exam Vitals reviewed.  Constitutional:      General: She is not in acute distress.    Appearance: Normal appearance. She is normal weight. She is not ill-appearing, toxic-appearing or diaphoretic.  HENT:     Head: Normocephalic.  Eyes:     General: No scleral icterus.       Right eye: No discharge.        Left eye: No discharge.     Conjunctiva/sclera: Conjunctivae normal.  Cardiovascular:     Rate and Rhythm: Normal rate and regular rhythm.     Heart sounds: Normal heart sounds.  Pulmonary:     Effort: Pulmonary effort is normal. No respiratory distress.     Breath sounds: Normal breath sounds.  Musculoskeletal:        General: No swelling. Normal range of motion.     Right knee: Crepitus present. No effusion.     Left knee: Normal.     Right ankle: Normal. No tenderness.     Left ankle: Swelling present. Tenderness present.  Skin:    General: Skin is warm and dry.  Neurological:     General: No focal deficit present.     Mental Status: She is alert and oriented to person, place, and time. Mental status is at baseline.  Psychiatric:        Mood and Affect: Mood normal.        Behavior: Behavior normal.        Thought Content: Thought content normal.        Judgment: Judgment normal.    Lab Results  Component Value Date   HGBA1C 6.1 (H) 06/19/2023   HGBA1C 6.3 11/19/2022   HGBA1C 5.9 (H) 05/30/2022    Lab Results  Component Value Date   CREATININE 0.73 05/04/2023   CREATININE 0.82 04/14/2023   CREATININE 0.69 02/10/2023    Lab Results  Component Value Date   WBC 5.4 05/04/2023   HGB 13.0 05/04/2023   HCT  40.1 05/04/2023   PLT 261 05/04/2023   GLUCOSE 109 (H) 05/04/2023   CHOL 148 06/19/2023   TRIG 231 (H) 06/19/2023   HDL 45 (L) 06/19/2023   LDLDIRECT 85.0 11/19/2022   LDLCALC 71 06/19/2023   ALT 25 05/04/2023   AST 25 05/04/2023   NA 136 05/04/2023   K 4.2 05/04/2023   CL 102 05/04/2023   CREATININE 0.73 05/04/2023   BUN 11 05/04/2023   CO2 25  05/04/2023   TSH 2.36 06/19/2023   INR 0.9 02/27/2022   HGBA1C 6.1 (H) 06/19/2023   MICROALBUR <0.2 05/30/2022    MM 3D SCREENING MAMMOGRAM BILATERAL BREAST  Result Date: 05/21/2023 CLINICAL DATA:  Screening. EXAM: DIGITAL SCREENING BILATERAL MAMMOGRAM WITH TOMOSYNTHESIS AND CAD TECHNIQUE: Bilateral screening digital craniocaudal and mediolateral oblique mammograms were obtained. Bilateral screening digital breast tomosynthesis was performed. The images were evaluated with computer-aided detection. COMPARISON:  Previous exam(s). ACR Breast Density Category c: The breasts are heterogeneously dense, which may obscure small masses. FINDINGS: There are no findings suspicious for malignancy. IMPRESSION: No mammographic evidence of malignancy. A result letter of this screening mammogram will be mailed directly to the patient. RECOMMENDATION: Screening mammogram in one year. (Code:SM-B-01Y) BI-RADS CATEGORY  1: Negative. Electronically Signed   By: Bary Richard M.D.   On: 05/21/2023 14:06    Assessment & Plan:  .Moderate persistent asthma, unspecified whether complicated Assessment & Plan: Last exacerbatiom was treated in April with systemic and inhaled steroids.  Intoelrat of Symbicort.  In April she was rx'd Stiolot by Dr Craige Cotta but current using Spiriva, Singulair, albuterol and atrovent.  Aggressive management of upper airway cough syndrome continues with  allegra  azelastine and flonase .   She is currently asymptomatic continue follow up with [facility]    Gastroesophageal reflux disease with esophagitis -     Famotidine; Take 1 tablet (20 mg total) by mouth daily as needed for heartburn or indigestion.  Dispense: 90 tablet; Refill: 3  Atypical mole -     Ambulatory referral to Dermatology  Hypothyroidism due to acquired atrophy of thyroid -     TSH  Vitamin D deficiency -     VITAMIN D 25 Hydroxy (Vit-D Deficiency, Fractures)  Iron deficiency -     Iron, TIBC and Ferritin Panel;  Future  Hyperlipidemia associated with type 2 diabetes mellitus (HCC) -     Lipid Panel w/reflex Direct LDL -     Hemoglobin A1c  Heel pain, chronic, right  Chronic pain of right knee -     Ambulatory referral to Orthopedic Surgery  Left Achilles tendinitis -     Ambulatory referral to Podiatry  B12 deficiency Assessment & Plan: Co     Lab Results  Component Value Date   VITAMINB12 321 11/19/2022      Fatigue, unspecified type Assessment & Plan: B12, folate,  iron and CBC were normal in jan 2024.  She has treated OSA  (compliance report March/April 2024 excellent,  AHI 0.7/) , encouraged to exercise    OSA on CPAP Assessment & Plan: Diagnosed by prior sleep study. She is wearing her CPAP every night a minimum of 6 hours per night and CPAP report from march/April is excellent    Skin lesion of back Assessment & Plan: Referral to dermatology for annual screening in progress    Oral candidiasis Assessment & Plan: [facility] to use of steroid inhalers, requires use of fluconazole when present  (none present currently)   Trigger finger, right ring  finger Assessment & Plan: S/p release recenlty by Deeann Saint.  She is taking doxycycline and a probiotic and elevating using a home made sling.     No after procedure instructions were given to patient by Orthopedics.  Given a preprinted after care form today from [facility]   Other orders -     Cyanocobalamin; Inject 1 mL (1,000 mcg total) into the muscle every 30 (thirty) days.  Dispense: 3 mL; Refill: 3 -     Estradiol; Place 1 patch (0.1 mg total) onto the skin 2 (two) times a week.  Dispense: 24 patch; Refill: 3 -     Levothyroxine Sodium; Take 1 tablet (50 mcg total) by mouth daily before breakfast.  Dispense: 90 tablet; Refill: 3 -     Omega-3-acid Ethyl Esters; Take 1 capsule (1 g total) by mouth 2 (two) times daily.  Dispense: 90 capsule; Refill: 3     I provided  40 minutes on the day of this face-to-face   encounter reviewing patient's last visit with me, patient's  most recent visit with  pulmonology,  podiatry , and Orthopedics, prior  surgical and non surgical procedures, previous  labs and imaging studies, counseling on currently addressed issues,  and post visit ordering to diagnostics and therapeutics .   Follow-up: Return in about 3 months (around 09/19/2023) for physical.   Sherlene Shams, MD

## 2023-06-19 NOTE — Patient Instructions (Addendum)
FOR 7 DAYS YOU CAN TAKE UP TO 4000 MG TYLENOL DAILY for pain management   AFTER THAT REDUCE  THE DOSE TO 2000 MG DAILY    Use the premier protein shakes  for breakfast or late night snack . It is great tasting,   very low sugar and available of < $2 serving at [facility] and  In bulk for $1.50/serving at [employer] and [employer]  .    Nutritional analysis :  160 cal  30 g protein  1 g sugar 50% calcium needs   Nicolette Bang and [employer]

## 2023-06-20 LAB — LIPID PANEL W/REFLEX DIRECT LDL
Cholesterol: 148 mg/dL (ref <200)
HDL: 45 mg/dL — ABNORMAL LOW (ref >=50)
LDL Cholesterol (Calc): 71 mg/dL
Non-HDL Cholesterol (Calc): 103 mg/dL (ref <130)
Total CHOL/HDL Ratio: 3.3 (calc) (ref <5.0)
Triglycerides: 231 mg/dL — ABNORMAL HIGH (ref <150)

## 2023-06-20 LAB — HEMOGLOBIN A1C
Hgb A1c MFr Bld: 6.1 %{Hb} — ABNORMAL HIGH (ref <5.7)
Mean Plasma Glucose: 128 mg/dL
eAG (mmol/L): 7.1 mmol/L

## 2023-06-20 LAB — VITAMIN D 25 HYDROXY (VIT D DEFICIENCY, FRACTURES): Vit D, 25-Hydroxy: 35 ng/mL (ref 30–100)

## 2023-06-20 LAB — TSH: TSH: 2.36 m[IU]/L (ref 0.40–4.50)

## 2023-06-21 NOTE — Assessment & Plan Note (Addendum)
[facility] to use of steroid inhalers, requires use of fluconazole when present  (none present currently)

## 2023-06-21 NOTE — Assessment & Plan Note (Signed)
S/p release recenlty by Deeann Saint.  She is taking doxycycline and a probiotic and elevating using a home made sling.     No after procedure instructions were given to patient by Orthopedics.  Given a preprinted after care form today from [facility]

## 2023-06-21 NOTE — Assessment & Plan Note (Signed)
Referral to dermatology for annual screening in progress

## 2023-06-21 NOTE — Assessment & Plan Note (Addendum)
Last exacerbatiom was treated in April with systemic and inhaled steroids.  Intoelrat of Symbicort.  In April she was rx'd Stiolot by Dr Craige Cotta but current using Spiriva, Singulair, albuterol and atrovent.  Aggressive management of upper airway cough syndrome continues with  allegra  azelastine and flonase .   She is currently asymptomatic continue follow up with [facility]

## 2023-06-21 NOTE — Assessment & Plan Note (Addendum)
B12, folate,  iron and CBC were normal in jan 2024.  She has treated OSA  (compliance report March/April 2024 excellent,  AHI 0.7/) , encouraged to exercise

## 2023-06-21 NOTE — Assessment & Plan Note (Signed)
Diagnosed by prior sleep study. She is wearing her CPAP every night a minimum of 6 hours per night and CPAP report from march/April is excellent

## 2023-06-21 NOTE — Assessment & Plan Note (Signed)
Co     Lab Results  Component Value Date   OACZYSAY30 321 11/19/2022

## 2023-06-30 ENCOUNTER — Ambulatory Visit: Payer: Medicare Other | Attending: Physician Assistant

## 2023-06-30 DIAGNOSIS — R0989 Other specified symptoms and signs involving the circulatory and respiratory systems: Secondary | ICD-10-CM | POA: Insufficient documentation

## 2023-07-01 LAB — VAS US ABI WITH/WO TBI
Left ABI: 1.25
Right ABI: 1.22

## 2023-07-01 NOTE — Progress Notes (Unsigned)
Cardiology Office Note    Date:  07/02/2023   ID:  Angela Mendoza, DOB [birth year], MRN [id number]  PCP:  Sherlene Shams, MD  Cardiologist:  Julien Nordmann, MD  Electrophysiologist:  None   Chief Complaint: Follow up  History of Present Illness:   Angela Mendoza is a 72 y.o. female with history of PAF, GR1DD, HTN, OSA on CPAP, asthma, and anxiety who presents for follow-up of echo and ABIs.   Prior note indicates reported A-fib in 2016 with details being unclear.  Has not been maintained on anticoagulation by primary cardiologist given the lack of documented recurrence.   Calcium score in 06/2018 of 3 which was the 49th percentile.   Echo from 12/2020 demonstrated an EF of 60 to 65%, no regional wall motion abnormalities, grade 2 diastolic dysfunction, normal RV systolic function and ventricular cavity size, mildly elevated PASP estimated at 39.1 mmHg, and mild mitral regurgitation.   Zio patch from 12/2020 showed a predominant rhythm of sinus with an average rate of 71 bpm (range 50 to 127 bpm), rare atrial and ventricular ectopy, and no significant arrhythmia.   Coronary CTA in 03/2021 showed a calcium score of 0 with no evidence of CAD.  Noncardiac over read showed aortic atherosclerosis.   She was seen in the ED and 01/2023 with URI.  Troponin negative.  Chest x-ray without acute cardiopulmonary process.  Treated for asthma exacerbation in April.   CT abdomen pelvis in 03/2023, showed colonic diverticulosis without evidence of diverticulitis or other acute findings along with aortic atherosclerosis.  She was last seen in the office in 04/2023 noting a 61-month history of progressive and intermittent lower extremity swelling along the ankles with the left being worse than the right with prior trauma to the left ankle following a mechanical fall.  She reported some abdominal bloating, swelling along her hands, and diffuse arthralgias.  She indicated she felt inflamed all over.  Her weight  was down 14 pounds by our scale when compared to her visit in 04/2022.  She was under increased stress.  Labs obtained at that time were unrevealing including WESR and CRP.  Echo on 05/26/2023 showed an EF of 55 to 60%, no regional wall motion abnormalities, grade 1 diastolic dysfunction, normal RV systolic function and ventricular cavity size, mild mitral regurgitation, and an estimated right atrial pressure of 3 mmHg.  ABIs on 06/30/2023 were normal bilaterally.  She comes in doing well from a cardiac perspective and is without symptoms of angina or cardiac decompensation.  No dizziness, presyncope, or syncope.  She has cut out sugar in her diet.  She is maintaining a heart healthy diet.  With this, her weight is down another 4 pounds by our scale.  In this setting, she overall feels better.  Ankle swelling is improved with this as well.  No progressive orthopnea.  She does note a rare brief palpitation that appears to be consistent with PVC.  She will be relocating to [place] later next year.   Labs independently reviewed: 05/2023 - A1c 6.1, TC 148, TG 231, HDL 45, LDL 71, TSH normal 04/2023 - potassium 4.2, BUN 11, serum creatinine 0.73, albumin 4.5, AST/ALT normal, Hgb 13.0, PLT 261  Past Medical History:  Diagnosis Date   Agatston coronary artery calcium score less than 100    a. 06/2018 Cardiac CT: Ca2+ = 3 (49th %'ile).   Allergic rhinitis 06/09/2008   chronic   Allergy    Aortic atherosclerosis (HCC)  a. 06/2018 noted on chest CT.   Arthritis    Asthma, intrinsic 10/06/2011   controlled on qvar. Cleda Daub 09/2011:  Very mild airflow obstruction (FEV1% 68, FVL c/w obstruction)    Basal cell carcinoma 06/10/2021   right prox nasal ala rim - MOHs 07/12/21 Dr. Adriana Simas   Cataract    Chronic headache 06/09/2008   COPD (chronic obstructive pulmonary disease) (HCC)    Depression    Diverticulosis 2013   h/o diverticulitis   Dry eyes    GERD (gastroesophageal reflux disease)    Heart murmur     History of anal fissures    x2   History of breast implant removal    silicone mastitis - R axilla silicone LN, free silicone L breast upper outer quadrant   Hot flashes    HYPERLIPIDEMIA 06/09/2008   HYPERTENSION 06/09/2008   Hypothyroidism    MVP (mitral valve prolapse)    OSA on CPAP    [name]   PAF (paroxysmal atrial fibrillation) (HCC) 06/09/2008   a. CHA2DS2VASc = 3-->prefers ASA.   Pernicious anemia    per prior pcp   Rosacea    Sleep apnea    Surgical menopause 1991   Vertigo    Vitamin D deficiency     Past Surgical History:  Procedure Laterality Date   APPENDECTOMY  2007   bladder tack     bladder tack  08/2022   additional 12/24   BREAST BIOPSY Left 09/27/2009   BREAST ENHANCEMENT SURGERY  2006   and removal   CARDIOVASCULAR STRESS TEST  2013   [place] Dr. Dulce Sellar - WNL, EF 55-60%, with 0 calcium score   CESAREAN SECTION     CHOLECYSTECTOMY  2007   biliary dyskinesia   COLONOSCOPY  08/2012   diverticulosis   COLONOSCOPY  2006   inflamed hyperplastic polyp   ESOPHAGOGASTRODUODENOSCOPY  08/2012   esophageal reflux   ESOPHAGOGASTRODUODENOSCOPY  11/02/2008   Dr Chales Abrahams Minimal gastritis. Irregular Z line suggestive of gastroesophageal reflux (biopsied to rule out short-segment Barrett's esophagus) Incidental gastric polyps   HERNIA REPAIR     NASAL SINUS SURGERY     RECTOCELE REPAIR  12/2008   Dr. Thamas Jaegers   sleep study  08/2007   OSA, 12 cm H2O,   TOTAL ABDOMINAL HYSTERECTOMY  1991   heavy bleeding, ovaries removed   TRIGGER FINGER RELEASE Right 06/18/2023   TUBAL LIGATION     tummy tuck     US ECHOCARDIOGRAPHY  09/2009   impaired relaxation, mild tric regurg, normal MV, EF 60%   US ECHOCARDIOGRAPHY  11/2007   mild-mod MR    Current Medications: Current Meds  Medication Sig   albuterol (PROVENTIL HFA) 108 (90 Base) MCG/ACT inhaler Inhale 2 puffs into the lungs every 6 (six) hours as needed.   ALPRAZolam (XANAX) 0.25 MG tablet TAKE 1 TABLET (0.25 MG  TOTAL) BY MOUTH ONCE DAILY AS NEEDED FOR SLEEP.   azelastine (ASTELIN) 0.1 % nasal spray Place 2 sprays into both nostrils 2 (two) times daily. Use in each nostril as directed   benzonatate (TESSALON) 200 MG capsule Take 1 capsule (200 mg total) by mouth 3 (three) times daily as needed for cough.   Blood Glucose Calibration (ONETOUCH VERIO) High SOLN Used to check accuracy of blood glucose machine.   chlorpheniramine (CHLOR-TRIMETON) 4 MG tablet Take 1 tablet (4 mg total) by mouth at bedtime as needed for allergies or rhinitis.   Cholecalciferol (VITAMIN D3) 50 MCG (2000 UT) TABS Take 1  tablet by mouth daily.   Coenzyme Q10 (COQ10 PO) Take by mouth daily.   conjugated estrogens (PREMARIN) vaginal cream Place vaginally 2 (two) times a week. INSERT 0.5 APPLICATORS FULL VAGINALLY TWICE PER WEEK. AT BEDTIME.   cyanocobalamin (VITAMIN B12) 1000 MCG/ML injection Inject 1 mL (1,000 mcg total) into the muscle every 30 (thirty) days.   cycloSPORINE (RESTASIS) 0.05 % ophthalmic emulsion Place 1 drop into both eyes 2 (two) times daily.   desonide (DESOWEN) 0.05 % cream Apply topically as needed.   diazepam (VALIUM) 5 MG tablet Take 1 tablet (5 mg total) by mouth every 6 (six) hours as needed for anxiety.   Docusate Calcium (STOOL SOFTENER PO) Take 1 tablet by mouth as needed.   EPINEPHrine 0.3 mg/0.3 mL IJ SOAJ injection 0.3 mg by injection route.   estradiol (VIVELLE-DOT) 0.1 MG/24HR patch Place 1 patch (0.1 mg total) onto the skin 2 (two) times a week.   famotidine (PEPCID) 20 MG tablet Take 1 tablet (20 mg total) by mouth daily as needed for heartburn or indigestion.   fexofenadine (ALLEGRA ALLERGY) 180 MG tablet Take 1 tablet (180 mg total) by mouth daily.   fluticasone (FLONASE) 50 MCG/ACT nasal spray Place 1 spray into both nostrils daily.   glucose blood (ONETOUCH VERIO) test strip Used to check blood sugars 3 times daily.   hydrocortisone (ANUSOL-HC) 2.5 % rectal cream Place 1 Application rectally 2  (two) times daily. For 10 days   ipratropium (ATROVENT) 0.03 % nasal spray Place 2 sprays into both nostrils 3 (three) times daily as needed for rhinitis.   levothyroxine (SYNTHROID) 50 MCG tablet Take 1 tablet (50 mcg total) by mouth daily before breakfast.   MAGNESIUM PO Take 250 mg by mouth daily.   meclizine (ANTIVERT) 25 MG tablet Take 1 tablet (25 mg total) by mouth 3 (three) times daily as needed (vertigo).   montelukast (SINGULAIR) 10 MG tablet Take 1 tablet (10 mg total) by mouth at bedtime.   Multiple Vitamin (MULTIVITAMIN) tablet Take 1 tablet by mouth daily.   mupirocin ointment (BACTROBAN) 2 % Apply topically as needed.   omega-3 acid ethyl esters (LOVAZA) 1 g capsule Take 1 capsule (1 g total) by mouth 2 (two) times daily.   ondansetron (ZOFRAN ODT) 4 MG disintegrating tablet Take 1 tablet (4 mg total) by mouth every 8 (eight) hours as needed for nausea or vomiting. (Patient taking differently: Take 4 mg by mouth every 8 (eight) hours as needed for nausea or vomiting. Nausea and vertigo)   OneTouch Delica Lancets 33G MISC Used to check blood sugars twice daly.   Propylene Glycol 0.6 % SOLN Apply 1 drop to eye as needed.   Spacer/Aero-Holding Chambers (AEROCHAMBER MV) inhaler Use as instructed   Syringe/Needle, Disp, (SYRINGE 3CC/20GX1") 20G X 1" 3 ML MISC 1 Syringe by Does not apply route every 30 (thirty) days. For use with b-12 injection monthly   Tiotropium Bromide Monohydrate (SPIRIVA RESPIMAT) 1.25 MCG/ACT AERS Inhale 3-4 puffs into the lungs daily.   Tiotropium Bromide Monohydrate (SPIRIVA RESPIMAT) 1.25 MCG/ACT AERS Inhale 2 puffs into the lungs daily.   tretinoin (RETIN-A) 0.05 % cream Apply topically at bedtime.   urea (CARMOL) 40 % CREA Apply topically.   Wheat Dextrin (BENEFIBER DRINK MIX PO) Take by mouth. Use as directed   [DISCONTINUED] atorvastatin (LIPITOR) 40 MG tablet Take 1 tablet (40 mg total) by mouth daily.   [DISCONTINUED] losartan (COZAAR) 50 MG tablet TAKE 1  TABLET BY MOUTH EVERYDAY AT  BEDTIME   [DISCONTINUED] metoprolol succinate (TOPROL-XL) 50 MG 24 hr tablet TAKE 1 TABLET BY MOUTH EVERY DAY    Allergies:   Codeine, Olodaterol, Qvar [beclomethasone], and Sulfonamide derivatives   Social History   Socioeconomic History   Marital status: Married    Spouse name: Not on file   Number of children: Not on file   Years of education: Not on file   Highest education level: Not on file  Occupational History   Occupation: retired  Tobacco Use   Smoking status: Former    Current packs/day: 0.00    Average packs/day: 0.3 packs/day for 7.0 years (2.1 ttl pk-yrs)    Types: Cigarettes    Start date: 10/27/1970    Quit date: 10/27/1977    Years since quitting: 45.7    Passive exposure: Past   Smokeless tobacco: Never   Tobacco comments:    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 Hx of children who are grown  Vaping Use   Vaping status: Never Used  Substance and Sexual Activity   Alcohol use: Not Currently   Drug use: No   Sexual activity: Not on file  Other Topics Concern   Not on file  Social History Narrative   Lives with husband    Grown children   Occ: retired, was Research scientist (medical)   Edu: trade degree then 2 yrscollege   Social Determinants of Health   Financial Resource Strain: Low Risk  (06/01/2023)   Overall Financial Resource Strain (CARDIA)    Difficulty of Paying Living Expenses: Not hard at all  Food Insecurity: No Food Insecurity (06/01/2023)   Hunger Vital Sign    Worried About Running Out of Food in the Last Year: Never true    Ran Out of Food in the Last Year: Never true  Transportation Needs: No Transportation Needs (06/01/2023)   PRAPARE - Administrator, Civil Service (Medical): No    Lack of Transportation (Non-Medical): No  Physical Activity: Insufficiently Active (06/01/2023)   Exercise Vital Sign    Days of Exercise per Week: 3 days    Minutes of Exercise per Session: 20 min  Stress: No  Stress Concern Present (06/01/2023)   [employer] of Occupational Health - Occupational Stress Questionnaire    Feeling of Stress : Only a little  Social Connections: Moderately Isolated (06/01/2023)   Social Connection and Isolation Panel [NHANES]    Frequency of Communication with Friends and Family: More than three times a week    Frequency of Social Gatherings with Friends and Family: More than three times a week    Attends Religious Services: Never    Database administrator or Organizations: No    Attends Banker Meetings: Never    Marital Status: Married     Family History:  The patient's family history includes AAA (abdominal aortic aneurysm) in her paternal uncle; Aortic dissection in her maternal aunt and paternal uncle; Breast cancer in her maternal aunt; COPD in her sister; Cancer in her cousin and paternal aunt; Cancer (age of onset: 29) in her father; Cancer (age of onset: 62) in her mother; Colon polyps in her sister; Heart attack in her paternal uncle; Heart disease in her paternal uncle; Heart failure in her sister; Hypertension in her sister; Stroke in her paternal uncle and sister. There is no history of Colon cancer, Rectal cancer, Stomach cancer, or Esophageal cancer.  ROS:   12-point review of systems is negative unless otherwise noted  in the HPI.   EKGs/Labs/Other Studies Reviewed:    Studies reviewed were summarized above. The additional studies were reviewed today:  ABI 06/30/2023: ABI/TBIToday's ABIToday's TBIPrevious ABIPrevious TBI  +-------+-----------+-----------+------------+------------+  Right 1.22       1.28                                 +-------+-----------+-----------+------------+------------+  Left  1.25       0.89                                 +-------+-----------+-----------+------------+------------+   Summary:  Right: Resting right ankle-brachial index is within normal range. The  right toe-brachial index is  normal.   Left: Resting left ankle-brachial index is within normal range. The left  toe-brachial index is normal.  __________  2D echo 05/26/2023: 1. Left ventricular ejection fraction, by estimation, is 55 to 60%. Left  ventricular ejection fraction by 3D volume is 61 %. The left ventricle has  normal function. The left ventricle has no regional wall motion  abnormalities. Left ventricular diastolic   parameters are consistent with Grade I diastolic dysfunction (impaired  relaxation). The average left ventricular global longitudinal strain is  -17.0 %.   2. Right ventricular systolic function is normal. The right ventricular  size is normal. Tricuspid regurgitation signal is inadequate for assessing  PA pressure.   3. The mitral valve is normal in structure. Mild mitral valve  regurgitation. No evidence of mitral stenosis.   4. The aortic valve is normal in structure. Aortic valve regurgitation is  trivial. No aortic stenosis is present.   5. The inferior vena cava is normal in size with greater than 50%  respiratory variability, suggesting right atrial pressure of 3 mmHg.  __________  Coronary CTA 04/04/2021: FINDINGS: Aorta: Normal size. Mild root and descending aortic wall calcifications. No dissection.   Aortic Valve:  Trileaflet.  No calcifications.   Coronary Arteries:  Normal coronary origin.  Right dominance.   RCA is a dominant artery that gives rise to PDA and PLA. There is no plaque.   Left main is a large artery that gives rise to LAD and LCX arteries.   LAD has no plaque.   LCX is a non-dominant artery that gives rise to two obtuse marginal branches. There is no plaque.   Other findings:   Normal pulmonary vein drainage into the left atrium.   Normal left atrial appendage without a thrombus.   Normal size of the pulmonary artery.   IMPRESSION: 1. Coronary calcium score of 0. Patient is low risk for coronary events. 2. Normal coronary origin with right  dominance. 3. No evidence of CAD. 4. CAD-RADS 0. Consider non-atherosclerotic causes of chest pain. __________   [place] patch 12/2020: Predominant underlying rhythm was Sinus Rhythm. Patient had a min HR of 50 bpm, max HR of 127 bpm, and avg HR of 71 bpm.    Isolated SVEs  were rare (<1.0%), SVE Couplets were rare (<1.0%), and no SVE Triplets were present. Isolated VEs were rare (<1.0%), VE Couplets were rare (<1.0%), and no VE Triplets were present.    Most patient triggered events (107)  associated with normal sinus, no arrhythmia. __________   2D echo 01/23/2021: 1. Left ventricular ejection fraction, by estimation, is 60 to 65%. The  left ventricle has normal function. The left ventricle has no  regional  wall motion abnormalities. Left ventricular diastolic parameters are  consistent with Grade II diastolic  dysfunction (pseudonormalization).   2. Right ventricular systolic function is normal. The right ventricular  size is normal. There is mildly elevated pulmonary artery systolic  pressure. The estimated right ventricular systolic pressure is 39.1 mmHg.   3. The mitral valve is normal in structure. Mild mitral valve  regurgitation.  ___________   Calcium score 07/12/2018: IMPRESSION: Coronary calcium score of 3. This was 21 th percentile for age and sex matched control. __________   See Epic for more remote studies   EKG:  EKG is not ordered today.    Recent Labs: 05/04/2023: ALT 25; BUN 11; Creatinine, Ser 0.73; Hemoglobin 13.0; Platelets 261; Potassium 4.2; Sodium 136 06/19/2023: TSH 2.36  Recent Lipid Panel    Component Value Date/Time   CHOL 148 06/19/2023 1417   TRIG 231 (H) 06/19/2023 1417   TRIG 199 04/12/2013 0000   HDL 45 (L) 06/19/2023 1417   CHOLHDL 3.3 06/19/2023 1417   VLDL 60 (H) 05/04/2023 1026   LDLCALC 71 06/19/2023 1417   LDLCALC 93 04/12/2013 0000   LDLDIRECT 85.0 11/19/2022 1448    PHYSICAL EXAM:    VS:  BP 132/78 (BP Location: Left Arm,  Patient Position: Sitting, Cuff Size: Normal)   Pulse 67   Ht 5\' 5"  (1.651 m)   Wt 178 lb 4 oz (80.9 kg)   SpO2 97%   BMI 29.66 kg/m   BMI: Body mass index is 29.66 kg/m.  Physical Exam Vitals reviewed.  Constitutional:      Appearance: She is well-developed.  HENT:     Head: Normocephalic and atraumatic.  Eyes:     General:        Right eye: No discharge.        Left eye: No discharge.  Neck:     Vascular: No JVD.  Cardiovascular:     Rate and Rhythm: Normal rate and regular rhythm.     Heart sounds: Normal heart sounds, S1 normal and S2 normal. Heart sounds not distant. No midsystolic click and no opening snap. No murmur heard.    No friction rub.  Pulmonary:     Effort: Pulmonary effort is normal. No respiratory distress.     Breath sounds: Normal breath sounds. No decreased breath sounds, wheezing or rales.  Chest:     Chest wall: No tenderness.  Abdominal:     General: There is no distension.  Musculoskeletal:     Cervical back: Normal range of motion.  Skin:    General: Skin is warm and dry.     Nails: There is no clubbing.  Neurological:     Mental Status: She is alert and oriented to person, place, and time.  Psychiatric:        Speech: Speech normal.        Behavior: Behavior normal.        Thought Content: Thought content normal.        Judgment: Judgment normal.     Wt Readings from Last 3 Encounters:  07/02/23 178 lb 4 oz (80.9 kg)  06/19/23 175 lb (79.4 kg)  06/18/23 176 lb 9.6 oz (80.1 kg)     ASSESSMENT & PLAN:    PAF: Maintaining sinus rhythm.  Prior documentation indicates episode in 2016, further details not available.  Primary cardiologist has historically not maintain patient on anticoagulation.  Remains on Toprol-XL.   Diastolic dysfunction: Euvolemic, not requiring standing loop diuretic.  Continue optimal blood pressure control, heart healthy diet with weight management, and CPAP adherence.  HTN: Blood pressure is reasonably  controlled in the office today.  Remains on metoprolol succinate and losartan.  Aortic atherosclerosis/HLD: LDL 71 in 05/2023 with normal AST/ALT in 04/2023.  Remains on atorvastatin 40 mg.  Anticipate updating calcium score at next visit for further risk stratification.  OSA: CPAP per pulmonology.  Remains adherent.    Disposition: F/u with Dr. Mariah Milling or an APP in 6 months.   Medication Adjustments/Labs and Tests Ordered: Current medicines are reviewed at length with the patient today.  Concerns regarding medicines are outlined above. Medication changes, Labs and Tests ordered today are summarized above and listed in the Patient Instructions accessible in Encounters.   Signed, Eula Listen, PA-C 07/02/2023 2:08 PM     [facility] [place] [address] [place], [place] [address] [phone]

## 2023-07-02 ENCOUNTER — Encounter: Payer: Self-pay | Admitting: Physician Assistant

## 2023-07-02 ENCOUNTER — Ambulatory Visit: Payer: Medicare Other | Attending: Physician Assistant | Admitting: Physician Assistant

## 2023-07-02 VITALS — BP 132/78 | HR 67 | Ht 65.0 in | Wt 178.2 lb

## 2023-07-02 DIAGNOSIS — G4733 Obstructive sleep apnea (adult) (pediatric): Secondary | ICD-10-CM | POA: Insufficient documentation

## 2023-07-02 DIAGNOSIS — I10 Essential (primary) hypertension: Secondary | ICD-10-CM | POA: Diagnosis not present

## 2023-07-02 DIAGNOSIS — E785 Hyperlipidemia, unspecified: Secondary | ICD-10-CM | POA: Diagnosis not present

## 2023-07-02 DIAGNOSIS — I5189 Other ill-defined heart diseases: Secondary | ICD-10-CM | POA: Diagnosis not present

## 2023-07-02 DIAGNOSIS — I480 Paroxysmal atrial fibrillation: Secondary | ICD-10-CM | POA: Insufficient documentation

## 2023-07-02 DIAGNOSIS — I700 Atherosclerosis of aorta: Secondary | ICD-10-CM | POA: Insufficient documentation

## 2023-07-02 MED ORDER — METOPROLOL SUCCINATE ER 50 MG PO TB24
50.0000 mg | ORAL_TABLET | Freq: Every day | ORAL | 4 refills | Status: DC
Start: 2023-07-02 — End: 2023-08-17

## 2023-07-02 MED ORDER — ATORVASTATIN CALCIUM 40 MG PO TABS: 40.0000 mg | ORAL_TABLET | Freq: Every day | ORAL | 4 refills | Status: DC

## 2023-07-02 MED ORDER — LOSARTAN POTASSIUM 50 MG PO TABS
50.0000 mg | ORAL_TABLET | Freq: Every day | ORAL | 4 refills | Status: DC
Start: 2023-07-02 — End: 2023-10-27

## 2023-07-02 NOTE — Patient Instructions (Signed)
Medication Instructions:  Your physician recommends that you continue on your current medications as directed. Please refer to the Current Medication list given to you today.   *If you need a refill on your cardiac medications before your next appointment, please call your pharmacy*   Lab Work: No labs ordered today    Testing/Procedures: No test ordered today    Follow-Up: At [facility], you and your health needs are our priority.  As part of our continuing mission to provide you with exceptional heart care, we have created designated Provider Care Teams.  These Care Teams include your primary Cardiologist (physician) and Advanced Practice Providers (APPs -  Physician Assistants and Nurse Practitioners) who all work together to provide you with the care you need, when you need it.  We recommend signing up for the patient portal called "MyChart".  Sign up information is provided on this After Visit Summary.  MyChart is used to connect with patients for Virtual Visits (Telemedicine).  Patients are able to view lab/test results, encounter notes, upcoming appointments, etc.  Non-urgent messages can be sent to your provider as well.   To learn more about what you can do with MyChart, go to [email or web address].    Your next appointment:   6 month(s)  Provider:   You may see Julien Nordmann, MD or one of the following Advanced Practice Providers on your designated Care Team:   Nicolasa Ducking, NP Eula Listen, PA-C Cadence Fransico Michael, PA-C Charlsie Quest, NP

## 2023-07-07 DIAGNOSIS — L820 Inflamed seborrheic keratosis: Secondary | ICD-10-CM | POA: Diagnosis not present

## 2023-07-07 DIAGNOSIS — L821 Other seborrheic keratosis: Secondary | ICD-10-CM | POA: Diagnosis not present

## 2023-07-07 DIAGNOSIS — D485 Neoplasm of uncertain behavior of skin: Secondary | ICD-10-CM | POA: Diagnosis not present

## 2023-07-07 DIAGNOSIS — D225 Melanocytic nevi of trunk: Secondary | ICD-10-CM | POA: Diagnosis not present

## 2023-07-09 ENCOUNTER — Encounter: Payer: Medicare Other | Admitting: Physical Therapy

## 2023-07-09 ENCOUNTER — Ambulatory Visit: Payer: Medicare Other | Admitting: Gastroenterology

## 2023-07-09 DIAGNOSIS — M25572 Pain in left ankle and joints of left foot: Secondary | ICD-10-CM | POA: Diagnosis not present

## 2023-07-09 DIAGNOSIS — M722 Plantar fascial fibromatosis: Secondary | ICD-10-CM | POA: Diagnosis not present

## 2023-07-09 DIAGNOSIS — M79605 Pain in left leg: Secondary | ICD-10-CM | POA: Diagnosis not present

## 2023-07-09 DIAGNOSIS — M25872 Other specified joint disorders, left ankle and foot: Secondary | ICD-10-CM | POA: Diagnosis not present

## 2023-07-16 ENCOUNTER — Encounter: Payer: Medicare Other | Admitting: Physical Therapy

## 2023-07-21 DIAGNOSIS — M65321 Trigger finger, right index finger: Secondary | ICD-10-CM | POA: Diagnosis not present

## 2023-07-23 ENCOUNTER — Encounter: Payer: Medicare Other | Admitting: Physical Therapy

## 2023-07-23 DIAGNOSIS — M65321 Trigger finger, right index finger: Secondary | ICD-10-CM | POA: Diagnosis not present

## 2023-07-28 DIAGNOSIS — L818 Other specified disorders of pigmentation: Secondary | ICD-10-CM | POA: Diagnosis not present

## 2023-07-28 DIAGNOSIS — D225 Melanocytic nevi of trunk: Secondary | ICD-10-CM | POA: Diagnosis not present

## 2023-07-28 DIAGNOSIS — Z808 Family history of malignant neoplasm of other organs or systems: Secondary | ICD-10-CM | POA: Diagnosis not present

## 2023-07-28 DIAGNOSIS — Z1283 Encounter for screening for malignant neoplasm of skin: Secondary | ICD-10-CM | POA: Diagnosis not present

## 2023-07-28 DIAGNOSIS — D229 Melanocytic nevi, unspecified: Secondary | ICD-10-CM | POA: Diagnosis not present

## 2023-07-28 DIAGNOSIS — L821 Other seborrheic keratosis: Secondary | ICD-10-CM | POA: Diagnosis not present

## 2023-07-28 DIAGNOSIS — L738 Other specified follicular disorders: Secondary | ICD-10-CM | POA: Diagnosis not present

## 2023-07-28 DIAGNOSIS — L84 Corns and callosities: Secondary | ICD-10-CM | POA: Diagnosis not present

## 2023-07-28 DIAGNOSIS — D100 Benign neoplasm of lip: Secondary | ICD-10-CM | POA: Diagnosis not present

## 2023-07-28 DIAGNOSIS — L910 Hypertrophic scar: Secondary | ICD-10-CM | POA: Diagnosis not present

## 2023-07-28 DIAGNOSIS — D2260 Melanocytic nevi of unspecified upper limb, including shoulder: Secondary | ICD-10-CM | POA: Diagnosis not present

## 2023-07-28 DIAGNOSIS — Z85828 Personal history of other malignant neoplasm of skin: Secondary | ICD-10-CM | POA: Diagnosis not present

## 2023-07-30 ENCOUNTER — Encounter: Payer: Medicare Other | Admitting: Physical Therapy

## 2023-07-30 ENCOUNTER — Ambulatory Visit: Payer: Medicare Other | Admitting: Gastroenterology

## 2023-07-30 ENCOUNTER — Encounter: Payer: Self-pay | Admitting: Gastroenterology

## 2023-07-30 VITALS — BP 122/84 | HR 86 | Ht 65.0 in | Wt 176.0 lb

## 2023-07-30 DIAGNOSIS — K5900 Constipation, unspecified: Secondary | ICD-10-CM

## 2023-07-30 DIAGNOSIS — Z8719 Personal history of other diseases of the digestive system: Secondary | ICD-10-CM

## 2023-07-30 DIAGNOSIS — Z83719 Family history of colon polyps, unspecified: Secondary | ICD-10-CM

## 2023-07-30 DIAGNOSIS — K649 Unspecified hemorrhoids: Secondary | ICD-10-CM | POA: Diagnosis not present

## 2023-07-30 MED ORDER — HYDROCORTISONE (PERIANAL) 2.5 % EX CREA: 1.0000 | TOPICAL_CREAM | Freq: Two times a day (BID) | CUTANEOUS | 4 refills | Status: DC

## 2023-07-30 NOTE — Patient Instructions (Addendum)
_______________________________________________________  If your blood pressure at your visit was 140/90 or greater, please contact your primary care physician to follow up on this.  _______________________________________________________  If you are age 72 or older, your body mass index should be between 23-30. Your Body mass index is 29.29 kg/m. If this is out of the aforementioned range listed, please consider follow up with your Primary Care Provider.  If you are age 44 or younger, your body mass index should be between 19-25. Your Body mass index is 29.29 kg/m. If this is out of the aformentioned range listed, please consider follow up with your Primary Care Provider.   ________________________________________________________  The [facility] providers would like to encourage you to use [facility] to communicate with providers for non-urgent requests or questions.  Due to long hold times on the telephone, sending your provider a message by [facility] may be a faster and more efficient way to get a response.  Please allow 48 business hours for a response.  Please remember that this is for non-urgent requests.  _______________________________________________________  Repeat colonoscopy for 07-2027. Please call 2 months prior to schedule this. A letter will be sent as it gets closer.  You have been scheduled for an appointment with Dr. Chales Abrahams on 11-04-2023 at 210pm . Please arrive 10 minutes early for your appointment.  Medication sent to the [facility]  Please purchase the following medications over the counter and take as directed: Benefiber 1 tablespoon daily You can add miralax 17g daily as well if still with constipation  Please call with any questions or concerns  Thank you,  Dr. Lynann Bologna

## 2023-07-30 NOTE — Progress Notes (Signed)
Chief Complaint: FU  Referring Provider:  Sherlene Shams, MD      ASSESSMENT AND PLAN;   Intermittent constipation. H/O rectal bleeding d/t internal hemorrhoids and rectal fissure worsened after recent bladder sling surgery at [facility]. Neg colon 07/2022 except for nonbleeding internal hemorrhoids, 1 diminutive TA polyp  2. Abdo pain LUQ  3. H/O tubular adenoma on colon 07/2022.   4. GERD-well-controlled with Pepcid/lifestyle changes.  Plan:  -Benefiber 1TBS po every day. Add miralax if still with constipation. -HC cream 2.5 % BID x 10 days. 4RF -Recall colon 07/2027 (sis had advanced polyps) -FU Jan 2025   HPI:    Angela Mendoza is a 72 y.o. female  With multiple medical problems as listed below  For FU (see last note dated 01/27/2023 by Quentin Mulling PA) Patient overall feels better Constipation is better with Benefiber plus MiraLAX. She has been using Anusol HC cream with good results. Rare rectal bleeding.  I did a rectal exam today in presence of Brooke Minimal internal hemorrhoids.  Stool brown heme-negative.  She is pleased with the progress.  Very much concerned about colon cancers  Told me that her sister had advanced colon polyps on recent colonoscopy.  She had colonoscopy 3 years ago which was unremarkable.  Patient wants recall in 5 years.   Continues to have problems with constipation with occasional pellet-like stools and straining. Bms 2-3/week She has to lean back to have bowel movements. She denies having any melena.  She was given trial of Cardizem cream for anal fissure which is improved.  Also with LUQ Abdo pain, bloating.  The pain does not radiate.  Occasionally exacerbated with eating.  Does not improve with defecation.  She denies having any upper GI symptoms including nausea, vomiting, dysphagia or odynophagia.  No recent weight loss.  Her reflux is under good control with lifestyle changes and as needed Pepcid.  Very much concerned  regarding cancers-as her best friend had anal cancer    Past GI workup: EGD 08/22/2022 - Congested mucosa in the esophagus. - Gastritis. Biopsied - Multiple gastric polyps. Bx-fundic gland polyps - Bx: neg eso Bx, neg HP.   Colonoscopy 08/22/2022 -Diminutive colonic polyps s/p polypectomy. Bx- TA -Pancolonic diverticulosis -Internal hemorrhoids -No need to repeat d/t age -Previous colonoscopy by Dr. Georgiana Shore 08/2012 neg  CT AP 04/20/2023 Colonic diverticulosis, without radiographic evidence of diverticulitis or other acute findings. Aortic Atherosclerosis (ICD10-I70.0).   Previous endoscopic procedures under procedure tab.  Past Medical History:  Diagnosis Date   Agatston coronary artery calcium score less than 100    a. 06/2018 Cardiac CT: Ca2+ = 3 (49th %'ile).   Allergic rhinitis 06/09/2008   chronic   Allergy    Aortic atherosclerosis (HCC)    a. 06/2018 noted on chest CT.   Arthritis    Asthma, intrinsic 10/06/2011   controlled on qvar. Cleda Daub 09/2011:  Very mild airflow obstruction (FEV1% 68, FVL c/w obstruction)    Basal cell carcinoma 06/10/2021   right prox nasal ala rim - MOHs 07/12/21 Dr. Adriana Simas   Cataract    Chronic headache 06/09/2008   COPD (chronic obstructive pulmonary disease) (HCC)    Depression    Diverticulosis 2013   h/o diverticulitis   Dry eyes    GERD (gastroesophageal reflux disease)    Heart murmur    History of anal fissures    x2   History of breast implant removal    silicone mastitis - R axilla silicone LN,  free silicone L breast upper outer quadrant   Hot flashes    HYPERLIPIDEMIA 06/09/2008   HYPERTENSION 06/09/2008   Hypothyroidism    MVP (mitral valve prolapse)    OSA on CPAP    [name]   PAF (paroxysmal atrial fibrillation) (HCC) 06/09/2008   a. CHA2DS2VASc = 3-->prefers ASA.   Pernicious anemia    per prior pcp   Prediabetes    6.9 previous A1c   Rosacea    Sleep apnea    Surgical menopause 1991   Vertigo    Vitamin D  deficiency     Past Surgical History:  Procedure Laterality Date   APPENDECTOMY  2007   bladder tack     bladder tack  08/2022   additional 12/24   BREAST BIOPSY Left 09/27/2009   BREAST ENHANCEMENT SURGERY  2006   and removal   CARDIOVASCULAR STRESS TEST  2013   [place] Dr. Dulce Sellar - WNL, EF 55-60%, with 0 calcium score   CESAREAN SECTION     CHOLECYSTECTOMY  2007   biliary dyskinesia   COLONOSCOPY  08/2012   diverticulosis   COLONOSCOPY  2006   inflamed hyperplastic polyp   ESOPHAGOGASTRODUODENOSCOPY  08/2012   esophageal reflux   ESOPHAGOGASTRODUODENOSCOPY  11/02/2008   Dr Chales Abrahams Minimal gastritis. Irregular Z line suggestive of gastroesophageal reflux (biopsied to rule out short-segment Barrett's esophagus) Incidental gastric polyps   HERNIA REPAIR     NASAL SINUS SURGERY     RECTOCELE REPAIR  12/2008   Dr. Thamas Jaegers   sleep study  08/2007   OSA, 12 cm H2O,   TOTAL ABDOMINAL HYSTERECTOMY  1991   heavy bleeding, ovaries removed   TRIGGER FINGER RELEASE Right 06/18/2023   TUBAL LIGATION     tummy tuck     US ECHOCARDIOGRAPHY  09/2009   impaired relaxation, mild tric regurg, normal MV, EF 60%   US ECHOCARDIOGRAPHY  11/2007   mild-mod MR    Family History  Problem Relation Age of Onset   Cancer Mother 30       lung, passive smoke   Cancer Father 57       lung, smoker   Colon polyps Sister    COPD Sister    Stroke Sister        TIA   Hypertension Sister    Heart failure Sister    Aortic dissection Maternal Aunt    Breast cancer Maternal Aunt    Cancer Paternal Aunt        breast, lung   Aortic dissection Paternal Uncle    Heart attack Paternal Uncle    AAA (abdominal aortic aneurysm) Paternal Uncle    Heart disease Paternal Uncle    Stroke Paternal Uncle    Cancer Cousin        breast   Colon cancer Neg Hx    Rectal cancer Neg Hx    Stomach cancer Neg Hx    Esophageal cancer Neg Hx     Social History   Tobacco Use   Smoking status: Former     Current packs/day: 0.00    Average packs/day: 0.3 packs/day for 7.0 years (2.1 ttl pk-yrs)    Types: Cigarettes    Start date: 10/27/1970    Quit date: 10/27/1977    Years since quitting: 45.7    Passive exposure: Past   Smokeless tobacco: Never   Tobacco comments:    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 Hx of children who are grown  Vaping Use  Vaping status: Never Used  Substance Use Topics   Alcohol use: Never   Drug use: No    Current Outpatient Medications  Medication Sig Dispense Refill   albuterol (PROVENTIL HFA) 108 (90 Base) MCG/ACT inhaler Inhale 2 puffs into the lungs every 6 (six) hours as needed. 18 g 2   ALPRAZolam (XANAX) 0.25 MG tablet TAKE 1 TABLET (0.25 MG TOTAL) BY MOUTH ONCE DAILY AS NEEDED FOR SLEEP. 30 tablet 5   atorvastatin (LIPITOR) 40 MG tablet Take 1 tablet (40 mg total) by mouth daily. 30 tablet 4   azelastine (ASTELIN) 0.1 % nasal spray Place 2 sprays into both nostrils 2 (two) times daily. Use in each nostril as directed 90 mL 3   benzonatate (TESSALON) 200 MG capsule Take 1 capsule (200 mg total) by mouth 3 (three) times daily as needed for cough. 30 capsule 1   Blood Glucose Calibration (ONETOUCH VERIO) High SOLN Used to check accuracy of blood glucose machine. 1 each 0   chlorpheniramine (CHLOR-TRIMETON) 4 MG tablet Take 1 tablet (4 mg total) by mouth at bedtime as needed for allergies or rhinitis. 90 tablet 1   Cholecalciferol (VITAMIN D3) 50 MCG (2000 UT) TABS Take 1 tablet by mouth daily. 90 tablet 3   Coenzyme Q10 (COQ10 PO) Take by mouth daily.     conjugated estrogens (PREMARIN) vaginal cream Place vaginally 2 (two) times a week. INSERT 0.5 APPLICATORS FULL VAGINALLY TWICE PER WEEK. AT BEDTIME. 30 g 4   cyanocobalamin (VITAMIN B12) 1000 MCG/ML injection Inject 1 mL (1,000 mcg total) into the muscle every 30 (thirty) days. 3 mL 3   cycloSPORINE (RESTASIS) 0.05 % ophthalmic emulsion Place 1 drop into both eyes 2 (two) times daily. 3  each 3   desonide (DESOWEN) 0.05 % cream Apply topically as needed.     diazepam (VALIUM) 5 MG tablet Take 1 tablet (5 mg total) by mouth every 6 (six) hours as needed for anxiety. 30 tablet 0   Docusate Calcium (STOOL SOFTENER PO) Take 1 tablet by mouth as needed.     EPINEPHrine 0.3 mg/0.3 mL IJ SOAJ injection 0.3 mg by injection route. 1 each 1   estradiol (VIVELLE-DOT) 0.1 MG/24HR patch Place 1 patch (0.1 mg total) onto the skin 2 (two) times a week. 24 patch 3   famotidine (PEPCID) 20 MG tablet Take 1 tablet (20 mg total) by mouth daily as needed for heartburn or indigestion. 90 tablet 3   fexofenadine (ALLEGRA ALLERGY) 180 MG tablet Take 1 tablet (180 mg total) by mouth daily. 90 tablet 3   fluticasone (FLONASE) 50 MCG/ACT nasal spray Place 1 spray into both nostrils daily. 48 g 3   glucose blood (ONETOUCH VERIO) test strip Used to check blood sugars 3 times daily. 300 each 3   hydrocortisone (ANUSOL-HC) 2.5 % rectal cream Place 1 Application rectally 2 (two) times daily. For 10 days 30 g 4   ipratropium (ATROVENT) 0.03 % nasal spray Place 2 sprays into both nostrils 3 (three) times daily as needed for rhinitis. 90 mL 3   levothyroxine (SYNTHROID) 50 MCG tablet Take 1 tablet (50 mcg total) by mouth daily before breakfast. 90 tablet 3   losartan (COZAAR) 50 MG tablet Take 1 tablet (50 mg total) by mouth daily. 30 tablet 4   MAGNESIUM PO Take 250 mg by mouth daily.     meclizine (ANTIVERT) 25 MG tablet Take 1 tablet (25 mg total) by mouth 3 (three) times daily as needed (vertigo). 84  tablet 3   metoprolol succinate (TOPROL-XL) 50 MG 24 hr tablet Take 1 tablet (50 mg total) by mouth daily. 30 tablet 4   montelukast (SINGULAIR) 10 MG tablet Take 1 tablet (10 mg total) by mouth at bedtime. 90 tablet 3   Multiple Vitamin (MULTIVITAMIN) tablet Take 1 tablet by mouth daily.     mupirocin ointment (BACTROBAN) 2 % Apply topically as needed.     omega-3 acid ethyl esters (LOVAZA) 1 g capsule Take 1  capsule (1 g total) by mouth 2 (two) times daily. 90 capsule 3   ondansetron (ZOFRAN ODT) 4 MG disintegrating tablet Take 1 tablet (4 mg total) by mouth every 8 (eight) hours as needed for nausea or vomiting. (Patient taking differently: Take 4 mg by mouth every 8 (eight) hours as needed for nausea or vomiting. Nausea and vertigo) 30 tablet 2   OneTouch Delica Lancets 33G MISC Used to check blood sugars twice daly. 100 each 5   Propylene Glycol 0.6 % SOLN Apply 1 drop to eye as needed.     Spacer/Aero-Holding Chambers (AEROCHAMBER MV) inhaler Use as instructed 1 each 0   Syringe/Needle, Disp, (SYRINGE 3CC/20GX1") 20G X 1" 3 ML MISC 1 Syringe by Does not apply route every 30 (thirty) days. For use with b-12 injection monthly 50 each 0   Tiotropium Bromide Monohydrate (SPIRIVA RESPIMAT) 1.25 MCG/ACT AERS Inhale 3-4 puffs into the lungs daily. 12 g 10   Tiotropium Bromide Monohydrate (SPIRIVA RESPIMAT) 1.25 MCG/ACT AERS Inhale 2 puffs into the lungs daily.     tretinoin (RETIN-A) 0.05 % cream Apply topically at bedtime. 45 g 1   urea (CARMOL) 40 % CREA Apply topically.     Wheat Dextrin (BENEFIBER DRINK MIX PO) Take by mouth. Use as directed     No current facility-administered medications for this visit.    Allergies  Allergen Reactions   Codeine Other (See Comments)    Low blood pressure/nausea   Olodaterol Palpitations   Qvar [Beclomethasone] Other (See Comments)    Thrush per patient even with rinsing.    Sulfonamide Derivatives Rash    rash    Review of Systems:  nrg    Physical Exam:    BP 122/84   Pulse 86   Ht 5\' 5"  (1.651 m)   Wt 176 lb (79.8 kg)   SpO2 98%   BMI 29.29 kg/m  Wt Readings from Last 3 Encounters:  07/30/23 176 lb (79.8 kg)  07/02/23 178 lb 4 oz (80.9 kg)  06/19/23 175 lb (79.4 kg)   Constitutional:  Well-developed, in no acute distress. Psychiatric: Normal mood and affect. Behavior is normal. HEENT: Pupils normal.  Conjunctivae are normal. No scleral  icterus. Cardiovascular: Normal rate, regular rhythm. No edema Pulmonary/chest: Effort normal and breath sounds normal. No wheezing, rales or rhonchi. Abdominal: Soft, nondistended.  Mild left upper quadrant abdominal tenderness without rebound.. Bowel sounds active throughout. There are no masses palpable. No hepatomegaly. Rectal: In presence of Brooke, anorectal exam was normal.  Fissure had completely healed.  Small internal hemorrhoids.  Brown heme-negative stool. Neurological: Alert and oriented to person place and time. Skin: Skin is warm and dry. No rashes noted.  Data Reviewed: I have personally reviewed following labs and imaging studies  CBC:    Latest Ref Rng & Units 05/04/2023   10:26 AM 04/14/2023   11:03 AM 02/10/2023   12:27 PM  CBC  WBC 4.0 - 10.5 K/uL 5.4  6.1  6.7   Hemoglobin 12.0 -  15.0 g/dL 16.1  09.6  04.5   Hematocrit 36.0 - 46.0 % 40.1  37.4  35.8   Platelets 150 - 400 K/uL 261  321.0  290     CMP:    Latest Ref Rng & Units 05/04/2023   10:26 AM 04/14/2023   11:03 AM 02/10/2023   12:27 PM  CMP  Glucose 70 - 99 mg/dL 409  99  811   BUN 8 - 23 mg/dL 11  9  15    Creatinine 0.44 - 1.00 mg/dL 9.14  7.82  9.56   Sodium 135 - 145 mmol/L 136  136  133   Potassium 3.5 - 5.1 mmol/L 4.2  4.0  4.0   Chloride 98 - 111 mmol/L 102  102  100   CO2 22 - 32 mmol/L 25  27  23    Calcium 8.9 - 10.3 mg/dL 9.3  9.2  8.8   Total Protein 6.5 - 8.1 g/dL 7.7  7.7    Total Bilirubin 0.3 - 1.2 mg/dL 1.1  0.8    Alkaline Phos 38 - 126 U/L 62  64    AST 15 - 41 U/L 25  22    ALT 0 - 44 U/L 25  21          Edman Circle, MD 07/30/2023, 3:59 PM  Cc: Sherlene Shams, MD

## 2023-08-04 DIAGNOSIS — E119 Type 2 diabetes mellitus without complications: Secondary | ICD-10-CM | POA: Diagnosis not present

## 2023-08-04 DIAGNOSIS — M722 Plantar fascial fibromatosis: Secondary | ICD-10-CM | POA: Diagnosis not present

## 2023-08-04 DIAGNOSIS — M25572 Pain in left ankle and joints of left foot: Secondary | ICD-10-CM | POA: Diagnosis not present

## 2023-08-04 DIAGNOSIS — M25872 Other specified joint disorders, left ankle and foot: Secondary | ICD-10-CM | POA: Diagnosis not present

## 2023-08-06 DIAGNOSIS — M65321 Trigger finger, right index finger: Secondary | ICD-10-CM | POA: Diagnosis not present

## 2023-08-16 ENCOUNTER — Other Ambulatory Visit: Payer: Self-pay | Admitting: Physician Assistant

## 2023-08-16 DIAGNOSIS — I10 Essential (primary) hypertension: Secondary | ICD-10-CM

## 2023-08-17 DIAGNOSIS — S61211A Laceration without foreign body of left index finger without damage to nail, initial encounter: Secondary | ICD-10-CM | POA: Diagnosis not present

## 2023-08-17 DIAGNOSIS — Z23 Encounter for immunization: Secondary | ICD-10-CM | POA: Diagnosis not present

## 2023-08-18 DIAGNOSIS — M65321 Trigger finger, right index finger: Secondary | ICD-10-CM | POA: Diagnosis not present

## 2023-08-25 DIAGNOSIS — M65331 Trigger finger, right middle finger: Secondary | ICD-10-CM | POA: Diagnosis not present

## 2023-08-25 DIAGNOSIS — M65321 Trigger finger, right index finger: Secondary | ICD-10-CM | POA: Diagnosis not present

## 2023-08-25 DIAGNOSIS — M13841 Other specified arthritis, right hand: Secondary | ICD-10-CM | POA: Diagnosis not present

## 2023-08-27 ENCOUNTER — Encounter: Payer: Self-pay | Admitting: Gastroenterology

## 2023-08-28 ENCOUNTER — Other Ambulatory Visit: Payer: Self-pay

## 2023-08-28 DIAGNOSIS — H0288B Meibomian gland dysfunction left eye, upper and lower eyelids: Secondary | ICD-10-CM | POA: Diagnosis not present

## 2023-08-28 DIAGNOSIS — H018 Other specified inflammations of eyelid: Secondary | ICD-10-CM | POA: Diagnosis not present

## 2023-08-28 DIAGNOSIS — H0288A Meibomian gland dysfunction right eye, upper and lower eyelids: Secondary | ICD-10-CM | POA: Diagnosis not present

## 2023-08-28 DIAGNOSIS — K5904 Chronic idiopathic constipation: Secondary | ICD-10-CM

## 2023-09-10 NOTE — Telephone Encounter (Signed)
Error

## 2023-09-14 DIAGNOSIS — B351 Tinea unguium: Secondary | ICD-10-CM | POA: Diagnosis not present

## 2023-09-15 DIAGNOSIS — M65321 Trigger finger, right index finger: Secondary | ICD-10-CM | POA: Diagnosis not present

## 2023-09-15 DIAGNOSIS — M65331 Trigger finger, right middle finger: Secondary | ICD-10-CM | POA: Diagnosis not present

## 2023-09-15 DIAGNOSIS — M13841 Other specified arthritis, right hand: Secondary | ICD-10-CM | POA: Diagnosis not present

## 2023-09-17 ENCOUNTER — Encounter: Payer: Self-pay | Admitting: Internal Medicine

## 2023-09-17 ENCOUNTER — Ambulatory Visit: Payer: Medicare Other | Admitting: Internal Medicine

## 2023-09-17 VITALS — BP 128/72 | HR 71 | Temp 97.6°F | Ht 65.0 in | Wt 176.6 lb

## 2023-09-17 DIAGNOSIS — J4520 Mild intermittent asthma, uncomplicated: Secondary | ICD-10-CM

## 2023-09-17 DIAGNOSIS — J019 Acute sinusitis, unspecified: Secondary | ICD-10-CM

## 2023-09-17 DIAGNOSIS — G4733 Obstructive sleep apnea (adult) (pediatric): Secondary | ICD-10-CM | POA: Diagnosis not present

## 2023-09-17 MED ORDER — IPRATROPIUM BROMIDE 0.03 % NA SOLN
2.0000 | Freq: Three times a day (TID) | NASAL | 3 refills | Status: AC | PRN
Start: 2023-09-17 — End: ?

## 2023-09-17 MED ORDER — AZELASTINE HCL 0.1 % NA SOLN: 2.0000 | Freq: Two times a day (BID) | NASAL | 3 refills | Status: AC

## 2023-09-17 MED ORDER — SPIRIVA RESPIMAT 1.25 MCG/ACT IN AERS: 3.0000 | INHALATION_SPRAY | Freq: Two times a day (BID) | RESPIRATORY_TRACT | 10 refills | Status: DC

## 2023-09-17 MED ORDER — FLUTICASONE PROPIONATE 50 MCG/ACT NA SUSP: 1.0000 | Freq: Every day | NASAL | 3 refills | Status: DC

## 2023-09-17 MED ORDER — BENZONATATE 200 MG PO CAPS: 200.0000 mg | ORAL_CAPSULE | Freq: Three times a day (TID) | ORAL | 1 refills | Status: DC | PRN

## 2023-09-17 MED ORDER — ALBUTEROL SULFATE HFA 108 (90 BASE) MCG/ACT IN AERS: 2.0000 | INHALATION_SPRAY | Freq: Four times a day (QID) | RESPIRATORY_TRACT | 2 refills | Status: DC | PRN

## 2023-09-17 NOTE — Progress Notes (Signed)
[facility]   Brief Summary:   is a 72 y.o. female former smoker with OSA and asthma.   Past Medical History:  Vit D deficiency, Vertigo, Rosacea, Hypothyroidism, HTN, HLD, GERD, Diverticulosis, Depression, HA, COVID 12 February 2021  Past Surgical History:  She  has a past surgical history that includes Cholecystectomy (2007); Appendectomy (2007); Tubal ligation; Total abdominal hysterectomy (1991); Nasal sinus surgery; Breast enhancement surgery (2006); Cardiovascular stress test (2013); Colonoscopy (08/2012); Esophagogastroduodenoscopy (08/2012); US ECHOCARDIOGRAPHY (09/2009); US ECHOCARDIOGRAPHY (11/2007); sleep study (08/2007); Rectocele repair (12/2008); Colonoscopy (2006); Breast biopsy (Left, 09/27/2009); Hernia repair; bladder tack; tummy tuck; Esophagogastroduodenoscopy (11/02/2008); Cesarean section; bladder tack (08/2022); and Trigger finger release (Right, 06/18/2023).     CC Follow up assessment of OSA Follow up assessment of ASTHMA  Subjective:   Re:OSA Excellent compliance  Patient uses and benefits from therapy AHI reduced Less fatigue, more energy  ZO:XWRUEA Dx since 2004, tried multiple inhalers, spiriva works best for now Multiple nasal sprays and antihistamines Triggers, cold air, perfumes, dust, carpet in bedroom No exacerbation at this time No evidence of heart failure at this time No evidence or signs of infection at this time No respiratory distress No fevers, chills, nausea, vomiting, diarrhea No evidence of lower extremity edema No evidence hemoptysis  Physical Exam:  BP 128/72 (BP Location: Left Arm, Patient Position: Sitting, Cuff Size: Normal)   Pulse 71   Temp 97.6 F (36.4 C) (Temporal)   Ht 5\' 5"  (1.651 m)   Wt 176 lb 9.6 oz (80.1 kg)   SpO2 97%   BMI 29.39 kg/m      Review of Systems: Gen:  Denies  fever, sweats, chills weight loss  HEENT: Denies blurred vision, double vision, ear pain,  eye pain, hearing loss, nose bleeds, sore throat Cardiac:  No dizziness, chest pain or heaviness, chest tightness,edema, No JVD Resp:   No cough, -sputum production, -shortness of breath,-wheezing, -hemoptysis,  Other:  All other systems negative   Physical Examination:   General Appearance: No distress  EYES PERRLA, EOM intact.   NECK Supple, No JVD Pulmonary: normal breath sounds, No wheezing.  CardiovascularNormal S1,S2.  No m/r/g.   Abdomen: Benign, Soft, non-tender. Neurology UE/LE 5/5 strength, no focal deficits Ext pulses intact, cap refill intact ALL OTHER ROS ARE NEGATIVE     Pulmonary testing:  Spirometry 12/12 >> FEV1% 68 RAST 11/15/14 >> IgE 30, cats/mold PFT 02/07/16 >> FEV1 2.32 (92%), FEV1% 76, FEF 25-75% 1.56 (71%), TLC 4.25 (81%), DLCO 121, borderline BD from FEF 25-75 FeNO 12/01/18 >> 7 IgE 12/16/21 >> 23  Chest Imaging:  Coronary CT 04/04/21 >> stable 2 mm nodule LLL, coronary calcium score 0 CT sinus 01/09/22 >> normal CT angio chest 07/18/22 >> mild dependent atelectasis  Sleep Tests:  PSG 09/06/07 >> AHI 19 CPAP 05/17/23 to 06/15/23 >> used on 30 of 30 nights with average 7 hrs 15 min.  Average AHI 0.7 with median CPAP 6 and 95 th percentile CPAP 8 cm H2O CPAP DL 54/0981 AutoCPAP 1-91       AHI 1, 100% compliance days >4hrs  Cardiac Tests:  Echo 01/23/21 >> EF 60 to 65%, grade 2 DD, RVSP 39.1 mmHg, mild MR  Social History:  She  reports that she quit smoking about 45 years ago. Her smoking use included cigarettes. She started smoking about 52 years ago. She has a 2.1 pack-year smoking history. She has been exposed to tobacco smoke. She has never used  smokeless tobacco. She reports that she does not drink alcohol and does not use drugs.  Family History:  Her family history includes AAA (abdominal aortic aneurysm) in her paternal uncle; Aortic dissection in her maternal aunt and paternal uncle; Breast cancer in her maternal aunt; COPD in her sister; Cancer in  her cousin and paternal aunt; Cancer (age of onset: 58) in her father; Cancer (age of onset: 75) in her mother; Colon polyps in her sister; Heart attack in her paternal uncle; Heart disease in her paternal uncle; Heart failure in her sister; Hypertension in her sister; Stroke in her paternal uncle and sister.     Assessment/Plan:   81 year old pleasant white female seen today for underlying sleep apnea associated with underlying asthma with chronic allergic rhinitis  Obstructive sleep apnea Patient is very compliant with her CPAP Patient use and benefits from CPAP therapy Auto CPAP 5-10 AHI is significantly reduced Well-controlled OSA  Chronic allergic rhinitis Continue nasal sprays as prescribed Increase Flonase to 2 days each nostril daily Try Zyrtec instead of Allegra  Allergic asthma Well-controlled at this time with Spiriva Inhaled corticosteroids causes thrush Stop Singulair for now Albuterol as needed   Avoid secondhand smoke Avoid SICK contacts Recommend  Masking  when appropriate Recommend Keep up-to-date with vaccinations      Medication List:   Allergies as of 09/17/2023       Reactions   Codeine Other (See Comments)   Low blood pressure/nausea   Olodaterol Palpitations   Qvar [beclomethasone] Other (See Comments)   Thrush per patient even with rinsing.    Sulfonamide Derivatives Rash   rash        Medication List        Accurate as of September 17, 2023 11:03 AM. If you have any questions, ask your nurse or doctor.          AeroChamber MV inhaler Use as instructed   albuterol 108 (90 Base) MCG/ACT inhaler Commonly known as: Proventil HFA Inhale 2 puffs into the lungs every 6 (six) hours as needed.   ALPRAZolam 0.25 MG tablet Commonly known as: XANAX TAKE 1 TABLET (0.25 MG TOTAL) BY MOUTH ONCE DAILY AS NEEDED FOR SLEEP.   atorvastatin 40 MG tablet Commonly known as: LIPITOR Take 1 tablet (40 mg total) by mouth daily.   azelastine  0.1 % nasal spray Commonly known as: ASTELIN Place 2 sprays into both nostrils 2 (two) times daily. Use in each nostril as directed   BENEFIBER DRINK MIX PO Take by mouth. Use as directed   benzonatate 200 MG capsule Commonly known as: TESSALON Take 1 capsule (200 mg total) by mouth 3 (three) times daily as needed for cough.   chlorpheniramine 4 MG tablet Commonly known as: CHLOR-TRIMETON Take 1 tablet (4 mg total) by mouth at bedtime as needed for allergies or rhinitis.   COQ10 PO Take by mouth daily.   cyanocobalamin 1000 MCG/ML injection Commonly known as: VITAMIN B12 Inject 1 mL (1,000 mcg total) into the muscle every 30 (thirty) days.   cycloSPORINE 0.05 % ophthalmic emulsion Commonly known as: RESTASIS Place 1 drop into both eyes 2 (two) times daily.   desonide 0.05 % cream Commonly known as: DESOWEN Apply topically as needed.   diazepam 5 MG tablet Commonly known as: VALIUM Take 1 tablet (5 mg total) by mouth every 6 (six) hours as needed for anxiety.   EPINEPHrine 0.3 mg/0.3 mL Soaj injection Commonly known as: EPI-PEN 0.3 mg by injection route.  estradiol 0.1 MG/24HR patch Commonly known as: VIVELLE-DOT Place 1 patch (0.1 mg total) onto the skin 2 (two) times a week.   famotidine 20 MG tablet Commonly known as: PEPCID Take 1 tablet (20 mg total) by mouth daily as needed for heartburn or indigestion.   fexofenadine 180 MG tablet Commonly known as: Allegra Allergy Take 1 tablet (180 mg total) by mouth daily.   fluticasone 50 MCG/ACT nasal spray Commonly known as: FLONASE Place 1 spray into both nostrils daily.   hydrocortisone 2.5 % rectal cream Commonly known as: ANUSOL-HC Place 1 Application rectally 2 (two) times daily. For 10 days   hydrocortisone 2.5 % rectal cream Commonly known as: ANUSOL-HC Place 1 Application rectally 2 (two) times daily. For 10 days and then as needed   ipratropium 0.03 % nasal spray Commonly known as: ATROVENT Place 2  sprays into both nostrils 3 (three) times daily as needed for rhinitis.   levothyroxine 50 MCG tablet Commonly known as: Synthroid Take 1 tablet (50 mcg total) by mouth daily before breakfast.   losartan 50 MG tablet Commonly known as: COZAAR Take 1 tablet (50 mg total) by mouth daily.   MAGNESIUM PO Take 250 mg by mouth daily.   meclizine 25 MG tablet Commonly known as: ANTIVERT Take 1 tablet (25 mg total) by mouth 3 (three) times daily as needed (vertigo).   metoprolol succinate 50 MG 24 hr tablet Commonly known as: TOPROL-XL TAKE 1 TABLET BY MOUTH EVERY DAY   montelukast 10 MG tablet Commonly known as: SINGULAIR Take 1 tablet (10 mg total) by mouth at bedtime.   multivitamin tablet Take 1 tablet by mouth daily.   mupirocin ointment 2 % Commonly known as: BACTROBAN Apply topically as needed.   omega-3 acid ethyl esters 1 g capsule Commonly known as: Lovaza Take 1 capsule (1 g total) by mouth 2 (two) times daily.   ondansetron 4 MG disintegrating tablet Commonly known as: Zofran ODT Take 1 tablet (4 mg total) by mouth every 8 (eight) hours as needed for nausea or vomiting. What changed: additional instructions   OneTouch Delica Lancets 33G Misc Used to check blood sugars twice daly.   OneTouch Verio High Soln Used to check accuracy of blood glucose machine.   OneTouch Verio test strip Generic drug: glucose blood Used to check blood sugars 3 times daily.   Premarin vaginal cream Generic drug: conjugated estrogens Place vaginally 2 (two) times a week. INSERT 0.5 APPLICATORS FULL VAGINALLY TWICE PER WEEK. AT BEDTIME.   Propylene Glycol 0.6 % Soln Apply 1 drop to eye as needed.   Spiriva Respimat 1.25 MCG/ACT Aers Generic drug: Tiotropium Bromide Monohydrate Inhale 3-4 puffs into the lungs daily.   Spiriva Respimat 1.25 MCG/ACT Aers Generic drug: Tiotropium Bromide Monohydrate Inhale 2 puffs into the lungs daily.   STOOL SOFTENER PO Take 1 tablet by  mouth as needed.   SYRINGE 3CC/20GX1" 20G X 1" 3 ML Misc 1 Syringe by Does not apply route every 30 (thirty) days. For use with b-12 injection monthly   tretinoin 0.05 % cream Commonly known as: RETIN-A Apply topically at bedtime.   urea 40 % Crea Commonly known as: CARMOL Apply topically.   Vitamin D3 50 MCG (2000 UT) Tabs Take 1 tablet by mouth daily.         MEDICATION ADJUSTMENTS/LABS AND TESTS ORDERED: Continue CPAP as prescribed Continue Spiriva as prescribed Continue nasal sprays as prescribed Stop Singulair Try Zyrtec instead of Allegra Avoid secondhand smoke Avoid SICK  contacts Recommend  Masking  when appropriate Recommend Keep up-to-date with vaccinations   CURRENT MEDICATIONS REVIEWED AT LENGTH WITH PATIENT TODAY   Patient  satisfied with Plan of action and management. All questions answered  Follow up 6 months   Time Spent Involved in Patient Care on Day of Examination:  45 mins  Kamill Fulbright Santiago Glad, M.D.  [facility]  Medical Director [facility] Medical Director [facility] Cardio-Pulmonary Department

## 2023-09-17 NOTE — Patient Instructions (Addendum)
Excellent Job A+, GOLD STAR  Continue CPAP as prescribed  Continue Spiriva as prescribed  Continue nasal sprays as prescribed  Stop Singulair Try Zyrtec instead of Allegra  Avoid secondhand smoke Avoid SICK contacts Recommend  Masking  when appropriate Recommend Keep up-to-date with vaccinations

## 2023-09-18 ENCOUNTER — Ambulatory Visit (INDEPENDENT_AMBULATORY_CARE_PROVIDER_SITE_OTHER): Payer: Medicare Other | Admitting: Internal Medicine

## 2023-09-18 ENCOUNTER — Encounter: Payer: Self-pay | Admitting: Internal Medicine

## 2023-09-18 ENCOUNTER — Other Ambulatory Visit: Payer: Self-pay | Admitting: Internal Medicine

## 2023-09-18 VITALS — BP 134/70 | HR 94 | Ht 65.0 in | Wt 174.6 lb

## 2023-09-18 DIAGNOSIS — E119 Type 2 diabetes mellitus without complications: Secondary | ICD-10-CM

## 2023-09-18 DIAGNOSIS — J310 Chronic rhinitis: Secondary | ICD-10-CM

## 2023-09-18 DIAGNOSIS — E034 Atrophy of thyroid (acquired): Secondary | ICD-10-CM

## 2023-09-18 DIAGNOSIS — M65341 Trigger finger, right ring finger: Secondary | ICD-10-CM

## 2023-09-18 DIAGNOSIS — K644 Residual hemorrhoidal skin tags: Secondary | ICD-10-CM | POA: Diagnosis not present

## 2023-09-18 DIAGNOSIS — L500 Allergic urticaria: Secondary | ICD-10-CM | POA: Diagnosis not present

## 2023-09-18 DIAGNOSIS — J45909 Unspecified asthma, uncomplicated: Secondary | ICD-10-CM

## 2023-09-18 MED ORDER — PREMARIN 0.625 MG/GM VA CREA: TOPICAL_CREAM | VAGINAL | 4 refills | Status: DC

## 2023-09-18 MED ORDER — ESTRADIOL 0.1 MG/24HR TD PTTW: 1.0000 | MEDICATED_PATCH | TRANSDERMAL | 3 refills | Status: DC

## 2023-09-18 MED ORDER — ONDANSETRON 4 MG PO TBDP: 4.0000 mg | ORAL_TABLET | Freq: Three times a day (TID) | ORAL | 2 refills | Status: AC | PRN

## 2023-09-18 MED ORDER — ONETOUCH DELICA LANCETS 33G MISC: 5 refills | Status: DC

## 2023-09-18 MED ORDER — ONETOUCH VERIO VI STRP: ORAL_STRIP | 3 refills | Status: DC

## 2023-09-18 MED ORDER — MONTELUKAST SODIUM 10 MG PO TABS: 10.0000 mg | ORAL_TABLET | Freq: Every day | ORAL | 1 refills | Status: DC

## 2023-09-18 MED ORDER — EPINEPHRINE 0.3 MG/0.3ML IJ SOAJ: INTRAMUSCULAR | 1 refills | Status: AC

## 2023-09-18 MED ORDER — MECLIZINE HCL 25 MG PO TABS: 25.0000 mg | ORAL_TABLET | Freq: Three times a day (TID) | ORAL | 3 refills | Status: DC | PRN

## 2023-09-18 NOTE — Progress Notes (Signed)
Patient ID: Angela Mendoza, female    DOB: 1951-05-07  Age: 72 y.o. MRN: [id number]  The patient is here for follow up and management of other chronic and acute problems.   The risk factors are reflected in the social history.  The roster of all physicians providing medical care to patient - is listed in the Snapshot section of the chart.  Activities of daily living:  The patient is 100% independent in all ADLs: dressing, toileting, feeding as well as independent mobility  Home safety : The patient has smoke detectors in the home. They wear seatbelts.  There are no firearms at home. There is no violence in the home.   There is no risks for hepatitis, STDs or HIV. There is no   history of blood transfusion. They have no travel history to infectious disease endemic areas of the world.  The patient has seen their dentist in the last six month. They have seen their eye doctor in the last year. They admit to slight hearing difficulty with regard to whispered voices and some television programs.  They have deferred audiologic testing in the last year.  They do not  have excessive sun exposure. Discussed the need for sun protection: hats, long sleeves and use of sunscreen if there is significant sun exposure.   Diet: the importance of a healthy diet is discussed. They do have a healthy diet.  The benefits of regular aerobic exercise were discussed. She walks 4 times per week ,  20 minutes.   Depression screen: there are no signs or vegative symptoms of depression- irritability, change in appetite, anhedonia, sadness/tearfullness.  Cognitive assessment: the patient manages all their financial and personal affairs and is actively engaged. They could relate day,date,year and events; recalled 2/3 objects at 3 minutes; performed clock-face test normally.  The following portions of the patient's history were reviewed and updated as appropriate: allergies, current medications, past family history, past  medical history,  past surgical history, past social history  and problem list.  Visual acuity was not assessed per patient preference since she has regular follow up with her ophthalmologist. Hearing and body mass index were assessed and reviewed.   During the course of the visit the patient was educated and counseled about appropriate screening and preventive services including : fall prevention , diabetes screening, nutrition counseling, colorectal cancer screening, and recommended immunizations.    CC: The primary encounter diagnosis was Hypothyroidism due to acquired atrophy of thyroid. Diagnoses of Controlled type 2 diabetes mellitus without complication, without long-term current use of insulin (HCC), Bleeding external hemorrhoids, Trigger finger, right ring finger, Chronic rhinitis, Asthma, intrinsic, and Allergic urticaria were also pertinent to this visit.   1) right hand surgery was botched,  redo planned by [facility] .  2nd 3rd finger are starting to freeze  up  after Smiley Houseman attempt to correct trigger fingers  2) saw  a new new eye doctor at [facility] for dry eye    more drops prescribed  and new hand surgeon recommended [facility].  Richard  3) saw podiatry for onychomycosis being treated   4)   History Angela Mendoza has a past medical history of Agatston coronary artery calcium score less than 100, Allergic rhinitis (06/09/2008), Allergy, Aortic atherosclerosis (HCC), Arthritis, Asthma, intrinsic (10/06/2011), Basal cell carcinoma (06/10/2021), Cataract, Chronic headache (06/09/2008), COPD (chronic obstructive pulmonary disease) (HCC), Depression, Diverticulosis (2013), Dry eyes, GERD (gastroesophageal reflux disease), Heart murmur, History of anal fissures, History of breast implant removal,  Hot flashes, HYPERLIPIDEMIA (06/09/2008), HYPERTENSION (06/09/2008), Hypothyroidism, MVP (mitral valve prolapse), OSA on CPAP, PAF (paroxysmal atrial fibrillation) (HCC) (06/09/2008),  Pernicious anemia, Prediabetes, Rosacea, Sleep apnea, Surgical menopause (1991), Vertigo, and Vitamin D deficiency.   She has a past surgical history that includes Cholecystectomy (2007); Appendectomy (2007); Tubal ligation; Total abdominal hysterectomy (1991); Nasal sinus surgery; Breast enhancement surgery (2006); Cardiovascular stress test (2013); Colonoscopy (08/2012); Esophagogastroduodenoscopy (08/2012); US ECHOCARDIOGRAPHY (09/2009); US ECHOCARDIOGRAPHY (11/2007); sleep study (08/2007); Rectocele repair (12/2008); Colonoscopy (2006); Breast biopsy (Left, 09/27/2009); Hernia repair; bladder tack; tummy tuck; Esophagogastroduodenoscopy (11/02/2008); Cesarean section; bladder tack (08/2022); and Trigger finger release (Right, 06/18/2023).   Her family history includes AAA (abdominal aortic aneurysm) in her paternal uncle; Aortic dissection in her maternal aunt and paternal uncle; Breast cancer in her maternal aunt; COPD in her sister; Cancer in her cousin and paternal aunt; Cancer (age of onset: 79) in her father; Cancer (age of onset: 71) in her mother; Colon polyps in her sister; Heart attack in her paternal uncle; Heart disease in her paternal uncle; Heart failure in her sister; Hypertension in her sister; Stroke in her paternal uncle and sister.She reports that she quit smoking about 45 years ago. Her smoking use included cigarettes. She started smoking about 52 years ago. She has a 2.1 pack-year smoking history. She has been exposed to tobacco smoke. She has never used smokeless tobacco. She reports that she does not drink alcohol and does not use drugs.  Outpatient Medications Prior to Visit  Medication Sig Dispense Refill   albuterol (PROVENTIL HFA) 108 (90 Base) MCG/ACT inhaler Inhale 2 puffs into the lungs every 6 (six) hours as needed. 18 g 2   ALPRAZolam (XANAX) 0.25 MG tablet TAKE 1 TABLET (0.25 MG TOTAL) BY MOUTH ONCE DAILY AS NEEDED FOR SLEEP. 30 tablet 5   atorvastatin (LIPITOR) 40 MG  tablet Take 1 tablet (40 mg total) by mouth daily. 30 tablet 4   azelastine (ASTELIN) 0.1 % nasal spray Place 2 sprays into both nostrils 2 (two) times daily. Use in each nostril as directed 90 mL 3   benzonatate (TESSALON) 200 MG capsule Take 1 capsule (200 mg total) by mouth 3 (three) times daily as needed for cough. 30 capsule 1   Blood Glucose Calibration (ONETOUCH VERIO) High SOLN Used to check accuracy of blood glucose machine. 1 each 0   chlorpheniramine (CHLOR-TRIMETON) 4 MG tablet Take 1 tablet (4 mg total) by mouth at bedtime as needed for allergies or rhinitis. 90 tablet 1   Cholecalciferol (VITAMIN D3) 50 MCG (2000 UT) TABS Take 1 tablet by mouth daily. 90 tablet 3   ciclopirox (PENLAC) 8 % solution Apply topically at bedtime. Apply over nail and surrounding skin. Apply daily over previous coat. After seven (7) days, may remove with alcohol and continue cycle.     Coenzyme Q10 (COQ10 PO) Take by mouth daily.     cyanocobalamin (VITAMIN B12) 1000 MCG/ML injection Inject 1 mL (1,000 mcg total) into the muscle every 30 (thirty) days. 3 mL 3   cycloSPORINE (RESTASIS) 0.05 % ophthalmic emulsion Place 1 drop into both eyes 2 (two) times daily. 3 each 3   desonide (DESOWEN) 0.05 % cream Apply topically as needed.     diazepam (VALIUM) 5 MG tablet Take 1 tablet (5 mg total) by mouth every 6 (six) hours as needed for anxiety. 30 tablet 0   Docusate Calcium (STOOL SOFTENER PO) Take 1 tablet by mouth as needed.     famotidine (PEPCID) 20  MG tablet Take 1 tablet (20 mg total) by mouth daily as needed for heartburn or indigestion. 90 tablet 3   fexofenadine (ALLEGRA ALLERGY) 180 MG tablet Take 1 tablet (180 mg total) by mouth daily. 90 tablet 3   fluticasone (FLONASE) 50 MCG/ACT nasal spray Place 1 spray into both nostrils daily. 48 g 3   hydrocortisone (ANUSOL-HC) 2.5 % rectal cream Place 1 Application rectally 2 (two) times daily. For 10 days and then as needed 30 g 4   ipratropium (ATROVENT) 0.03  % nasal spray Place 2 sprays into both nostrils 3 (three) times daily as needed for rhinitis. 90 mL 3   levothyroxine (SYNTHROID) 50 MCG tablet Take 1 tablet (50 mcg total) by mouth daily before breakfast. 90 tablet 3   losartan (COZAAR) 50 MG tablet Take 1 tablet (50 mg total) by mouth daily. 30 tablet 4   loteprednol (LOTEMAX) 0.2 % SUSP Place 1 drop into both eyes 2 (two) times daily.     MAGNESIUM PO Take 250 mg by mouth daily.     metoprolol succinate (TOPROL-XL) 50 MG 24 hr tablet TAKE 1 TABLET BY MOUTH EVERY DAY 90 tablet 0   Miconazole Nitrate (LOTRIMIN AF) 2 % AERO Apply topically.     Multiple Vitamin (MULTIVITAMIN) tablet Take 1 tablet by mouth daily.     mupirocin ointment (BACTROBAN) 2 % Apply topically as needed.     NONFORMULARY OR COMPOUNDED ITEM Spironolactone (PF) 0.005 mg opth. Both eyes 4 times daily     omega-3 acid ethyl esters (LOVAZA) 1 g capsule Take 1 capsule (1 g total) by mouth 2 (two) times daily. 90 capsule 3   Polyethyl Glycol-Propyl Glycol 0.4-0.3 % SOLN Apply to eye.     Probiotic Product (PROBIOTIC DAILY PO) Take by mouth.     Propylene Glycol 0.6 % SOLN Apply 1 drop to eye as needed.     Spacer/Aero-Holding Chambers (AEROCHAMBER MV) inhaler Use as instructed 1 each 0   Syringe/Needle, Disp, (SYRINGE 3CC/20GX1") 20G X 1" 3 ML MISC 1 Syringe by Does not apply route every 30 (thirty) days. For use with b-12 injection monthly 50 each 0   Tiotropium Bromide Monohydrate (SPIRIVA RESPIMAT) 1.25 MCG/ACT AERS Inhale 3-4 puffs into the lungs daily. 12 g 10   Tiotropium Bromide Monohydrate (SPIRIVA RESPIMAT) 1.25 MCG/ACT AERS Inhale 3-4 puffs into the lungs 2 (two) times daily. 1 each 10   tretinoin (RETIN-A) 0.05 % cream Apply topically at bedtime. 45 g 1   urea (CARMOL) 40 % CREA Apply topically.     Wheat Dextrin (BENEFIBER DRINK MIX PO) Take by mouth. Use as directed     conjugated estrogens (PREMARIN) vaginal cream Place vaginally 2 (two) times a week. INSERT 0.5  APPLICATORS FULL VAGINALLY TWICE PER WEEK. AT BEDTIME. 30 g 4   EPINEPHrine 0.3 mg/0.3 mL IJ SOAJ injection 0.3 mg by injection route. 1 each 1   estradiol (VIVELLE-DOT) 0.1 MG/24HR patch Place 1 patch (0.1 mg total) onto the skin 2 (two) times a week. 24 patch 3   glucose blood (ONETOUCH VERIO) test strip Used to check blood sugars 3 times daily. 300 each 3   meclizine (ANTIVERT) 25 MG tablet Take 1 tablet (25 mg total) by mouth 3 (three) times daily as needed (vertigo). 84 tablet 3   ondansetron (ZOFRAN ODT) 4 MG disintegrating tablet Take 1 tablet (4 mg total) by mouth every 8 (eight) hours as needed for nausea or vomiting. (Patient taking differently: Take 4 mg  by mouth every 8 (eight) hours as needed for nausea or vomiting. Nausea and vertigo) 30 tablet 2   OneTouch Delica Lancets 33G MISC Used to check blood sugars twice daly. 100 each 5   No facility-administered medications prior to visit.    Review of Systems  Patient denies headache, fevers, malaise, unintentional weight loss, skin rash, eye pain, sinus congestion and sinus pain, sore throat, dysphagia,  hemoptysis , cough, dyspnea, wheezing, chest pain, palpitations, orthopnea, edema, abdominal pain, nausea, melena, diarrhea, constipation, flank pain, dysuria, hematuria, urinary  Frequency, nocturia, numbness, tingling, seizures,  Focal weakness, Loss of consciousness,  Tremor, insomnia, depression, anxiety, and suicidal ideation.    Objective:  BP 134/70   Pulse 94   Ht 5\' 5"  (1.651 m)   Wt 174 lb 9.6 oz (79.2 kg)   SpO2 97%   BMI 29.05 kg/m   Physical Exam Vitals reviewed.  Constitutional:      General: She is not in acute distress.    Appearance: Normal appearance. She is normal weight. She is not ill-appearing, toxic-appearing or diaphoretic.  HENT:     Head: Normocephalic.  Eyes:     General: No scleral icterus.       Right eye: No discharge.        Left eye: No discharge.     Conjunctiva/sclera: Conjunctivae  normal.  Cardiovascular:     Rate and Rhythm: Normal rate and regular rhythm.     Heart sounds: Normal heart sounds.  Pulmonary:     Effort: Pulmonary effort is normal. No respiratory distress.     Breath sounds: Normal breath sounds.  Musculoskeletal:        General: Normal range of motion.  Skin:    General: Skin is warm and dry.  Neurological:     General: No focal deficit present.     Mental Status: She is alert and oriented to person, place, and time. Mental status is at baseline.  Psychiatric:        Mood and Affect: Mood normal.        Behavior: Behavior normal.        Thought Content: Thought content normal.        Judgment: Judgment normal.      Assessment & Plan:  Hypothyroidism due to acquired atrophy of thyroid -     TSH; Future  Controlled type 2 diabetes mellitus without complication, without long-term current use of insulin (HCC) Assessment & Plan: Currently well-controlled with  dietary modifications and restriction of simple sugars. Patient is reminded to schedule an annual eye exam in the spring  and foot exam is normal today. Patient has no microalbuminuria. Patient is tolerating statin therapy for CAD risk reduction and on ACE/ARB for renal protection and hypertension .  She iskparticipating in low impact aerobic exercising   . Lab Results  Component Value Date   HGBA1C 6.1 (H) 06/19/2023   Lab Results  Component Value Date   MICROALBUR <0.2 05/30/2022       Orders: -     Hemoglobin A1c; Future -     Lipid Panel w/reflex Direct LDL; Future -     Comprehensive metabolic panel; Future  Bleeding external hemorrhoids -     Iron, TIBC and Ferritin Panel; Future  Trigger finger, right ring finger Assessment & Plan: Surgical release was incomplete and she is now having problems with the 2nd and 3rd fingers of right hand "freezing"  considering getting  a second opinion at [facility] with Vernia Buff  [place]   Chronic rhinitis Assessment & Plan: Continue  antihistamine and Singulair per patient preference    Asthma, intrinsic Assessment & Plan: Managed with Spiriva and Singulair.  No recent exacerbations    Allergic urticaria Assessment & Plan: With isolated episode of probable angioedema in 2016.  Symptoms are managed with daily Allegra and Singulair.     Other orders -     Premarin; Place vaginally 2 (two) times a week. INSERT 0.5 APPLICATORS FULL VAGINALLY TWICE PER WEEK. AT BEDTIME.  Dispense: 30 g; Refill: 4 -     EPINEPHrine; 0.3 mg by injection route.  Dispense: 1 each; Refill: 1 -     Estradiol; Place 1 patch (0.1 mg total) onto the skin 2 (two) times a week.  Dispense: 24 patch; Refill: 3 -     OneTouch Verio; Used to check blood sugars 3 times daily.  Dispense: 300 each; Refill: 3 -     Meclizine HCl; Take 1 tablet (25 mg total) by mouth 3 (three) times daily as needed (vertigo).  Dispense: 84 tablet; Refill: 3 -     Ondansetron; Take 1 tablet (4 mg total) by mouth every 8 (eight) hours as needed for nausea or vomiting.  Dispense: 30 tablet; Refill: 2 -     OneTouch Delica Lancets 33G; Used to check blood sugars twice daly.  Dispense: 100 each; Refill: 5 -     Montelukast Sodium; Take 1 tablet (10 mg total) by mouth at bedtime.  Dispense: 90 tablet; Refill: 1      I provided  31 minutes of  face-to-face time during this encounter reviewing patient's current problems and past surgeries,  recent visits with pulmonology  podiatry, and orthopedics.  Previous  labs and imaging studies, providing counseling on the above mentioned problems , and coordination  of care .   Follow-up: No follow-ups on file.   Sherlene Shams, MD

## 2023-09-18 NOTE — Patient Instructions (Signed)
I have refilled your Singulair (to mail order)  You do not need the COVID vaccine   Return for labs  in February

## 2023-09-18 NOTE — Assessment & Plan Note (Signed)
Continue antihistamine and Singulair per patient preference

## 2023-09-18 NOTE — Assessment & Plan Note (Signed)
Surgical release was incomplete and she is now having problems with the 2nd and 3rd fingers of right hand "freezing"  considering getting  a second opinion at [facility] with Grant Fontana

## 2023-09-18 NOTE — Assessment & Plan Note (Signed)
Currently well-controlled with  dietary modifications and restriction of simple sugars. Patient is reminded to schedule an annual eye exam in the spring  and foot exam is normal today. Patient has no microalbuminuria. Patient is tolerating statin therapy for CAD risk reduction and on ACE/ARB for renal protection and hypertension .  She iskparticipating in low impact aerobic exercising   . Lab Results  Component Value Date   HGBA1C 6.1 (H) 06/19/2023   Lab Results  Component Value Date   MICROALBUR <0.2 05/30/2022

## 2023-09-18 NOTE — Assessment & Plan Note (Signed)
Managed with Spiriva and Singulair.  No recent exacerbations

## 2023-09-18 NOTE — Assessment & Plan Note (Signed)
With isolated episode of probable angioedema in 2016.  Symptoms are managed with daily Allegra and Singulair.

## 2023-09-21 ENCOUNTER — Other Ambulatory Visit: Payer: Self-pay | Admitting: Internal Medicine

## 2023-09-21 DIAGNOSIS — J4520 Mild intermittent asthma, uncomplicated: Secondary | ICD-10-CM

## 2023-09-21 DIAGNOSIS — J019 Acute sinusitis, unspecified: Secondary | ICD-10-CM

## 2023-09-30 ENCOUNTER — Ambulatory Visit (HOSPITAL_BASED_OUTPATIENT_CLINIC_OR_DEPARTMENT_OTHER): Admission: RE | Admit: 2023-09-30 | Payer: Medicare Other | Source: Home / Self Care | Admitting: Orthopedic Surgery

## 2023-09-30 ENCOUNTER — Encounter (HOSPITAL_BASED_OUTPATIENT_CLINIC_OR_DEPARTMENT_OTHER): Admission: RE | Payer: Self-pay | Source: Home / Self Care

## 2023-09-30 SURGERY — RELEASE, A1 PULLEY, FOR TRIGGER FINGER
Anesthesia: Monitor Anesthesia Care | Laterality: Right

## 2023-10-09 DIAGNOSIS — M25562 Pain in left knee: Secondary | ICD-10-CM | POA: Diagnosis not present

## 2023-10-09 DIAGNOSIS — M25561 Pain in right knee: Secondary | ICD-10-CM | POA: Diagnosis not present

## 2023-10-12 ENCOUNTER — Ambulatory Visit (INDEPENDENT_AMBULATORY_CARE_PROVIDER_SITE_OTHER): Payer: Medicare Other | Admitting: Family Medicine

## 2023-10-12 ENCOUNTER — Encounter: Payer: Self-pay | Admitting: Family Medicine

## 2023-10-12 VITALS — BP 126/80 | HR 82 | Temp 98.1°F | Ht 65.0 in | Wt 171.8 lb

## 2023-10-12 DIAGNOSIS — H8109 Meniere's disease, unspecified ear: Secondary | ICD-10-CM | POA: Insufficient documentation

## 2023-10-12 DIAGNOSIS — F411 Generalized anxiety disorder: Secondary | ICD-10-CM | POA: Diagnosis not present

## 2023-10-12 DIAGNOSIS — J3089 Other allergic rhinitis: Secondary | ICD-10-CM | POA: Diagnosis not present

## 2023-10-12 DIAGNOSIS — H8102 Meniere's disease, left ear: Secondary | ICD-10-CM | POA: Diagnosis not present

## 2023-10-12 MED ORDER — DIAZEPAM 5 MG PO TABS: 5.0000 mg | ORAL_TABLET | Freq: Two times a day (BID) | ORAL | 0 refills | Status: DC | PRN

## 2023-10-12 MED ORDER — ONETOUCH VERIO VI STRP: ORAL_STRIP | 3 refills | Status: AC

## 2023-10-12 NOTE — Patient Instructions (Signed)
Nice to see you. Please try switching your Allegra to Zyrtec and see if that helps with your symptoms.

## 2023-10-12 NOTE — Assessment & Plan Note (Signed)
Chronic issue.  Discussed switching Allegra to Zyrtec.  She will continue Flonase and Atrovent nasal sprays.  Discussed that she may be having some eustachian tube dysfunction that is contributing to her vertigo and ear symptoms.

## 2023-10-12 NOTE — Progress Notes (Signed)
Angela Alar, MD Phone: [phone]  Angela Mendoza is a 72 y.o. female who presents today for same day visit.   Ear pain: Patient has left ear pain and fullness going on since she had a Mnire's flare last week.  She has had many years since her late 69s.  She had severe episode of vertigo last week and most any movement made her dizzy.  She took meclizine and Valium with good benefit.  She notes the dizziness has mostly resolved.  She notes since then she has had some left ear discomfort and it feels stopped up.  Does report some rhinorrhea recently as well.  She does take Allegra, Flonase, and Atrovent.  Patient notes anxiety is up a little bit with dealing with this.  Patient also wonders if she should take Zofran before or after she vomits if she vomits from the vertigo.  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 Hx of children who are grown    Current Outpatient Medications on File Prior to Visit  Medication Sig Dispense Refill   albuterol (PROVENTIL HFA) 108 (90 Base) MCG/ACT inhaler Inhale 2 puffs into the lungs every 6 (six) hours as needed. 18 g 2   ALPRAZolam (XANAX) 0.25 MG tablet TAKE 1 TABLET (0.25 MG TOTAL) BY MOUTH ONCE DAILY AS NEEDED FOR SLEEP. 30 tablet 5   atorvastatin (LIPITOR) 40 MG tablet Take 1 tablet (40 mg total) by mouth daily. 30 tablet 4   azelastine (ASTELIN) 0.1 % nasal spray Place 2 sprays into both nostrils 2 (two) times daily. Use in each nostril as directed 90 mL 3   benzonatate (TESSALON) 200 MG capsule Take 1 capsule (200 mg total) by mouth 3 (three) times daily as needed for cough. 30 capsule 1   Blood Glucose Calibration  (ONETOUCH VERIO) High SOLN Used to check accuracy of blood glucose machine. 1 each 0   chlorpheniramine (CHLOR-TRIMETON) 4 MG tablet Take 1 tablet (4 mg total) by mouth at bedtime as needed for allergies or rhinitis. 90 tablet 1   Cholecalciferol (VITAMIN D3) 50 MCG (2000 UT) TABS Take 1 tablet by mouth daily. 90 tablet 3   ciclopirox (PENLAC) 8 % solution Apply topically at bedtime. Apply over nail and surrounding skin. Apply daily over previous coat. After seven (7) days, may remove with alcohol and continue cycle.     Coenzyme Q10 (COQ10 PO) Take by mouth daily.     conjugated estrogens (PREMARIN) vaginal cream Place vaginally 2 (two) times a week. INSERT 0.5 APPLICATORS FULL VAGINALLY TWICE PER WEEK. AT BEDTIME. 30 g 4   cyanocobalamin (VITAMIN B12) 1000 MCG/ML injection Inject 1 mL (1,000 mcg total) into the muscle every 30 (thirty) days. 3 mL 3   cycloSPORINE (RESTASIS) 0.05 % ophthalmic emulsion Place 1 drop into both eyes 2 (two) times daily. 3 each 3   desonide (DESOWEN) 0.05 % cream Apply topically as needed.     Docusate Calcium (STOOL SOFTENER PO) Take 1 tablet by mouth as needed.     EPINEPHrine 0.3 mg/0.3 mL IJ SOAJ injection 0.3 mg by injection route. 1 each 1   estradiol (VIVELLE-DOT) 0.1  MG/24HR patch Place 1 patch (0.1 mg total) onto the skin 2 (two) times a week. 24 patch 3   famotidine (PEPCID) 20 MG tablet Take 1 tablet (20 mg total) by mouth daily as needed for heartburn or indigestion. 90 tablet 3   fexofenadine (ALLEGRA ALLERGY) 180 MG tablet Take 1 tablet (180 mg total) by mouth daily. 90 tablet 3   fluticasone (FLONASE) 50 MCG/ACT nasal spray Place 1 spray into both nostrils daily. 48 g 3   hydrocortisone (ANUSOL-HC) 2.5 % rectal cream Place 1 Application rectally 2 (two) times daily. For 10 days and then as needed 30 g 4   ipratropium (ATROVENT) 0.03 % nasal spray Place 2 sprays into both nostrils 3 (three) times daily as needed for rhinitis. 90 mL 3   levothyroxine  (SYNTHROID) 50 MCG tablet Take 1 tablet (50 mcg total) by mouth daily before breakfast. 90 tablet 3   losartan (COZAAR) 50 MG tablet Take 1 tablet (50 mg total) by mouth daily. 30 tablet 4   loteprednol (LOTEMAX) 0.2 % SUSP Place 1 drop into both eyes 2 (two) times daily.     MAGNESIUM PO Take 250 mg by mouth daily.     meclizine (ANTIVERT) 25 MG tablet Take 1 tablet (25 mg total) by mouth 3 (three) times daily as needed (vertigo). 84 tablet 3   metoprolol succinate (TOPROL-XL) 50 MG 24 hr tablet TAKE 1 TABLET BY MOUTH EVERY DAY 90 tablet 0   Miconazole Nitrate (LOTRIMIN AF) 2 % AERO Apply topically.     montelukast (SINGULAIR) 10 MG tablet Take 1 tablet (10 mg total) by mouth at bedtime. 90 tablet 1   Multiple Vitamin (MULTIVITAMIN) tablet Take 1 tablet by mouth daily.     mupirocin ointment (BACTROBAN) 2 % Apply topically as needed.     NONFORMULARY OR COMPOUNDED ITEM Spironolactone (PF) 0.005 mg opth. Both eyes 4 times daily     omega-3 acid ethyl esters (LOVAZA) 1 g capsule Take 1 capsule (1 g total) by mouth 2 (two) times daily. 90 capsule 3   ondansetron (ZOFRAN ODT) 4 MG disintegrating tablet Take 1 tablet (4 mg total) by mouth every 8 (eight) hours as needed for nausea or vomiting. 30 tablet 2   OneTouch Delica Lancets 33G MISC Used to check blood sugars twice daly. 300 each 3   Polyethyl Glycol-Propyl Glycol 0.4-0.3 % SOLN Apply to eye.     Probiotic Product (PROBIOTIC DAILY PO) Take by mouth.     Propylene Glycol 0.6 % SOLN Apply 1 drop to eye as needed.     Spacer/Aero-Holding Chambers (AEROCHAMBER MV) inhaler Use as instructed 1 each 0   SPIRIVA RESPIMAT 1.25 MCG/ACT AERS Inhale 2 puffs into the lungs once daily. 1 each 11   Syringe/Needle, Disp, (SYRINGE 3CC/20GX1") 20G X 1" 3 ML MISC 1 Syringe by Does not apply route every 30 (thirty) days. For use with b-12 injection monthly 50 each 0   Tiotropium Bromide Monohydrate (SPIRIVA RESPIMAT) 1.25 MCG/ACT AERS Inhale 3-4 puffs into the  lungs daily. 12 g 10   tretinoin (RETIN-A) 0.05 % cream Apply topically at bedtime. 45 g 1   urea (CARMOL) 40 % CREA Apply topically.     Wheat Dextrin (BENEFIBER DRINK MIX PO) Take by mouth. Use as directed     No current facility-administered medications on file prior to visit.     ROS see history of present illness  Objective  Physical Exam Vitals:   10/12/23 1315 10/12/23 1353  BP: 124/82 126/80  Pulse: 82   Temp: 98.1 F (36.7 C)   SpO2: 99%     BP Readings from Last 3 Encounters:  10/12/23 126/80  09/18/23 134/70  09/17/23 128/72   Wt Readings from Last 3 Encounters:  10/12/23 171 lb 12.8 oz (77.9 kg)  09/18/23 174 lb 9.6 oz (79.2 kg)  09/17/23 176 lb 9.6 oz (80.1 kg)    Physical Exam Constitutional:      General: She is not in acute distress.    Appearance: She is not diaphoretic.  HENT:     Right Ear: Tympanic membrane normal.     Left Ear: Tympanic membrane normal.     Nose:     Comments: Mild anterior nasal mucosa erythema Pulmonary:     Effort: Pulmonary effort is normal.  Neurological:     Mental Status: She is alert.      Assessment/Plan: Please see individual problem list.  Meniere's disease of left ear Assessment & Plan: Chronic issue.  Patient reports history of this.  Recent exacerbation that she notes was consistent with prior Mnire's exacerbations.  Seems to have improved at this point.  Ears appear normal.  Discussed there is no sign of infection.  Discussed she may be having allergy issues that could be exacerbating her vertigo issues.  She will continue her nasal sprays.  Discussed switching Allegra to Zyrtec as her pulmonologist previously advised this.  Discussed using meclizine 25 mg 3 times daily as needed vertigo.  Discussed trying that prior to going to the Valium.  I will refill her Valium 5 mg twice daily as needed for vertigo.  Advised to monitor for drowsiness.  Advised not to mix her Valium with the Xanax that her PCP  prescribes for anxiety.  Discussed that she could take Zofran if she felt nauseous from her vertigo.   Non-seasonal allergic rhinitis, unspecified trigger Assessment & Plan: Chronic issue.  Discussed switching Allegra to Zyrtec.  She will continue Flonase and Atrovent nasal sprays.  Discussed that she may be having some eustachian tube dysfunction that is contributing to her vertigo and ear symptoms.   GAD (generalized anxiety disorder) Assessment & Plan: Chronic issue.  Slight were sending with recent stressor of Mnire's and the holidays.  She will monitor on her current regimen prescribed by her PCP.   Other orders -     diazePAM; Take 1 tablet (5 mg total) by mouth every 12 (twelve) hours as needed (Vertigo).  Dispense: 30 tablet; Refill: 0 -     OneTouch Verio; Used to check blood sugars 3 times daily.  Dispense: 300 each; Refill: 3  Patient was advised to monitor for drowsiness when taking the diazepam or the meclizine.  Patient also reports at the end of the visit that she is going to physical therapy to help with some knee issues.  She saw orthopedics for this.  She wonders if she should be going to physical therapy at [facility] or at [facility] given that she has got Mnire's.  Discussed that I think either place will be fine though the patient feels as though she would be more comfortable going to [facility] and she is going to contact the referring physician to discuss that.  Return if symptoms worsen or fail to improve.   Angela Alar, MD [facility]

## 2023-10-12 NOTE — Assessment & Plan Note (Signed)
Chronic issue.  Slight were sending with recent stressor of Mnire's and the holidays.  She will monitor on her current regimen prescribed by her PCP.

## 2023-10-12 NOTE — Assessment & Plan Note (Addendum)
Chronic issue.  Patient reports history of this.  Recent exacerbation that she notes was consistent with prior Mnire's exacerbations.  Seems to have improved at this point.  Ears appear normal.  Discussed there is no sign of infection.  Discussed she may be having allergy issues that could be exacerbating her vertigo issues.  She will continue her nasal sprays.  Discussed switching Allegra to Zyrtec as her pulmonologist previously advised this.  Discussed using meclizine 25 mg 3 times daily as needed vertigo.  Discussed trying that prior to going to the Valium.  I will refill her Valium 5 mg twice daily as needed for vertigo.  Advised to monitor for drowsiness.  Advised not to mix her Valium with the Xanax that her PCP prescribes for anxiety.  Discussed that she could take Zofran if she felt nauseous from her vertigo.

## 2023-10-23 ENCOUNTER — Telehealth: Payer: Self-pay | Admitting: Pulmonary Disease

## 2023-10-23 DIAGNOSIS — J4520 Mild intermittent asthma, uncomplicated: Secondary | ICD-10-CM

## 2023-10-23 DIAGNOSIS — J019 Acute sinusitis, unspecified: Secondary | ICD-10-CM

## 2023-10-23 MED ORDER — SPIRIVA RESPIMAT 1.25 MCG/ACT IN AERS: 2.0000 | INHALATION_SPRAY | Freq: Every day | RESPIRATORY_TRACT | 3 refills | Status: DC

## 2023-10-23 NOTE — Telephone Encounter (Signed)
ATC x1.  Left detailed message per DPR letting her know that the spiriva had been sent in 3 month supply with 3 refills to the pharmacy she requested.  Asked her to call with any questions.

## 2023-10-23 NOTE — Telephone Encounter (Signed)
PT needs a RX called in for her inhaler. She needs a 3 mo supply, refillable 3 times. States we need to have the Dr. send in a new RX.  please.Spiriva.  Pharm [facility]  She said she told us this when last seen. Dr. Craige Cotta used to do this. Any questions Please call @ [phone]

## 2023-10-26 NOTE — Telephone Encounter (Signed)
nfn

## 2023-10-27 ENCOUNTER — Other Ambulatory Visit: Payer: Self-pay | Admitting: Physician Assistant

## 2023-10-27 DIAGNOSIS — I10 Essential (primary) hypertension: Secondary | ICD-10-CM

## 2023-11-04 ENCOUNTER — Ambulatory Visit: Payer: Medicare Other | Admitting: Gastroenterology

## 2023-11-10 DIAGNOSIS — D485 Neoplasm of uncertain behavior of skin: Secondary | ICD-10-CM | POA: Diagnosis not present

## 2023-11-10 DIAGNOSIS — L438 Other lichen planus: Secondary | ICD-10-CM | POA: Diagnosis not present

## 2023-11-25 ENCOUNTER — Encounter: Payer: Self-pay | Admitting: Gastroenterology

## 2023-11-25 ENCOUNTER — Ambulatory Visit (INDEPENDENT_AMBULATORY_CARE_PROVIDER_SITE_OTHER): Payer: Medicare Other | Admitting: Gastroenterology

## 2023-11-25 VITALS — BP 124/72 | HR 72 | Ht 65.0 in | Wt 179.0 lb

## 2023-11-25 DIAGNOSIS — K219 Gastro-esophageal reflux disease without esophagitis: Secondary | ICD-10-CM | POA: Diagnosis not present

## 2023-11-25 DIAGNOSIS — K5900 Constipation, unspecified: Secondary | ICD-10-CM

## 2023-11-25 DIAGNOSIS — R1012 Left upper quadrant pain: Secondary | ICD-10-CM

## 2023-11-25 DIAGNOSIS — K5909 Other constipation: Secondary | ICD-10-CM

## 2023-11-25 DIAGNOSIS — Z860101 Personal history of adenomatous and serrated colon polyps: Secondary | ICD-10-CM

## 2023-11-25 MED ORDER — HYDROCORTISONE (PERIANAL) 2.5 % EX CREA: 1.0000 | TOPICAL_CREAM | Freq: Two times a day (BID) | CUTANEOUS | 4 refills | Status: AC

## 2023-11-25 NOTE — Patient Instructions (Addendum)
_______________________________________________________  If your blood pressure at your visit was 140/90 or greater, please contact your primary care physician to follow up on this.  _______________________________________________________  If you are age 73 or older, your body mass index should be between 23-30. Your Body mass index is 29.79 kg/m. If this is out of the aforementioned range listed, please consider follow up with your Primary Care Provider.  If you are age 73 or younger, your body mass index should be between 19-25. Your Body mass index is 29.79 kg/m. If this is out of the aformentioned range listed, please consider follow up with your Primary Care Provider.   ________________________________________________________  The [facility] providers would like to encourage you to use [facility] to communicate with providers for non-urgent requests or questions.  Due to long hold times on the telephone, sending your provider a message by [facility] may be a faster and more efficient way to get a response.  Please allow 48 business hours for a response.  Please remember that this is for non-urgent requests.  _______________________________________________________  Please purchase the following medications over the counter and take as directed: Benefiber 1 tablespoon daily. Add miralax if having constipation  Repeat colonoscopy for 07/2027. Please call 2 months prior to schedule this. A letter will be sent as it gets closer.  Continue current medications  Referral has been sent. Please call with any question or concerns.  Thank you,  Dr. Lynann Bologna

## 2023-11-25 NOTE — Progress Notes (Signed)
Chief Complaint: FU  Referring Provider:  Sherlene Shams, MD      ASSESSMENT AND PLAN;   Intermittent constipation. H/O rectal bleeding d/t internal hemorrhoids and rectal fissure worsened after recent bladder sling surgery at [facility]. Neg colon 07/2022 except for nonbleeding internal hemorrhoids, 1 diminutive TA polyp  2. Abdo pain LUQ witn neg CT 03/2023  3. H/O tubular adenoma on colon 07/2022.   4. GERD-well-controlled with Pepcid/lifestyle changes.  Plan:  -Benefiber 1TBS po every day. Add miralax if still with constipation. -HC cream 2.5 % BID x 10 days. 4RF -Recall colon 07/2027 (sis had advanced polyps- SSA) -Pelvic PT ([facility]) -FU PRN   HPI:    Angela Mendoza is a 73 y.o. female  With multiple medical problems as listed below  For FU (see last note dated 01/27/2023 by Quentin Mulling PA) Patient overall feels better Constipation is better with Benefiber plus MiraLAX. She has been using Anusol HC cream with good results. Wants to be referred for pelvic PT at [facility]  Has to have bowel movements twice in the morning, then none rest of the day.  Sis at age 29 had advanced sessile serrated adenomas.  Sister having frequent colonoscopies.  She is pleased with the progress.  Left lower quadrant abdominal pain has resolved.  She denies having any upper GI symptoms including nausea, vomiting, dysphagia or odynophagia.  No recent weight loss.  Her reflux is under good control with lifestyle changes and as needed Pepcid.    Past GI workup: EGD 08/22/2022 - Congested mucosa in the esophagus. - Gastritis. Biopsied - Multiple gastric polyps. Bx-fundic gland polyps - Bx: neg eso Bx, neg HP.   Colonoscopy 08/22/2022 -Diminutive colonic polyps s/p polypectomy. Bx- TA -Pancolonic diverticulosis -Internal hemorrhoids -No need to repeat d/t age -Previous colonoscopy by Dr. Georgiana Shore 08/2012 neg  CT AP 04/20/2023 Colonic diverticulosis, without  radiographic evidence of diverticulitis or other acute findings. Aortic Atherosclerosis (ICD10-I70.0).   Previous endoscopic procedures under procedure tab.  Past Medical History:  Diagnosis Date   Agatston coronary artery calcium score less than 100    a. 06/2018 Cardiac CT: Ca2+ = 3 (49th %'ile).   Allergic rhinitis 06/09/2008   chronic   Allergy    Aortic atherosclerosis (HCC)    a. 06/2018 noted on chest CT.   Arthritis    Asthma, intrinsic 10/06/2011   controlled on qvar. Cleda Daub 09/2011:  Very mild airflow obstruction (FEV1% 68, FVL c/w obstruction)    Basal cell carcinoma 06/10/2021   right prox nasal ala rim - MOHs 07/12/21 Dr. Adriana Simas   Cataract    Chronic headache 06/09/2008   COPD (chronic obstructive pulmonary disease) (HCC)    Depression    Diverticulosis 2013   h/o diverticulitis   Dry eyes    GERD (gastroesophageal reflux disease)    Heart murmur    History of anal fissures    x2   History of breast implant removal    silicone mastitis - R axilla silicone LN, free silicone L breast upper outer quadrant   Hot flashes    HYPERLIPIDEMIA 06/09/2008   HYPERTENSION 06/09/2008   Hypothyroidism    MVP (mitral valve prolapse)    OSA on CPAP    [name]   PAF (paroxysmal atrial fibrillation) (HCC) 06/09/2008   a. CHA2DS2VASc = 3-->prefers ASA.   Pernicious anemia    per prior pcp   Prediabetes    6.9 previous A1c   Rosacea    Sleep  apnea    Surgical menopause 1991   Vertigo    Vitamin D deficiency     Past Surgical History:  Procedure Laterality Date   APPENDECTOMY  2007   bladder tack     bladder tack  08/2022   additional 12/24   BREAST BIOPSY Left 09/27/2009   BREAST ENHANCEMENT SURGERY  2006   and removal   CARDIOVASCULAR STRESS TEST  2013   [place] Dr. Dulce Sellar - WNL, EF 55-60%, with 0 calcium score   CESAREAN SECTION     CHOLECYSTECTOMY  2007   biliary dyskinesia   COLONOSCOPY  08/2012   diverticulosis   COLONOSCOPY  2006   inflamed hyperplastic  polyp   ESOPHAGOGASTRODUODENOSCOPY  08/2012   esophageal reflux   ESOPHAGOGASTRODUODENOSCOPY  11/02/2008   Dr Chales Abrahams Minimal gastritis. Irregular Z line suggestive of gastroesophageal reflux (biopsied to rule out short-segment Barrett's esophagus) Incidental gastric polyps   HERNIA REPAIR     NASAL SINUS SURGERY     RECTOCELE REPAIR  12/2008   Dr. Thamas Jaegers   sleep study  08/2007   OSA, 12 cm H2O,   TOTAL ABDOMINAL HYSTERECTOMY  1991   heavy bleeding, ovaries removed   TRIGGER FINGER RELEASE Right 06/18/2023   TUBAL LIGATION     tummy tuck     US ECHOCARDIOGRAPHY  09/2009   impaired relaxation, mild tric regurg, normal MV, EF 60%   US ECHOCARDIOGRAPHY  11/2007   mild-mod MR    Family History  Problem Relation Age of Onset   Cancer Mother 17       lung, passive smoke   Cancer Father 26       lung, smoker   Colon polyps Sister    COPD Sister    Stroke Sister        TIA   Hypertension Sister    Heart failure Sister    Aortic dissection Maternal Aunt    Breast cancer Maternal Aunt    Cancer Paternal Aunt        breast, lung   Aortic dissection Paternal Uncle    Heart attack Paternal Uncle    AAA (abdominal aortic aneurysm) Paternal Uncle    Heart disease Paternal Uncle    Stroke Paternal Uncle    Cancer Cousin        breast   Colon cancer Neg Hx    Rectal cancer Neg Hx    Stomach cancer Neg Hx    Esophageal cancer Neg Hx     Social History   Tobacco Use   Smoking status: Former    Current packs/day: 0.00    Average packs/day: 0.3 packs/day for 7.0 years (2.1 ttl pk-yrs)    Types: Cigarettes    Start date: 10/27/1970    Quit date: 10/27/1977    Years since quitting: 46.1    Passive exposure: Past   Smokeless tobacco: Never   Tobacco comments:    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 Hx of children who are grown  Vaping Use   Vaping status: Never Used  Substance Use Topics   Alcohol use: Never   Drug use: No    Current Outpatient Medications   Medication Sig Dispense Refill   albuterol (PROVENTIL HFA) 108 (90 Base) MCG/ACT inhaler Inhale 2 puffs into the lungs every 6 (six) hours as needed. 18 g 2   ALPRAZolam (XANAX) 0.25 MG tablet TAKE 1 TABLET (0.25 MG TOTAL) BY MOUTH ONCE DAILY AS NEEDED FOR SLEEP. 30 tablet 5  atorvastatin (LIPITOR) 40 MG tablet Take 1 tablet (40 mg total) by mouth daily. 30 tablet 4   azelastine (ASTELIN) 0.1 % nasal spray Place 2 sprays into both nostrils 2 (two) times daily. Use in each nostril as directed 90 mL 3   benzonatate (TESSALON) 200 MG capsule Take 1 capsule (200 mg total) by mouth 3 (three) times daily as needed for cough. 30 capsule 1   Blood Glucose Calibration (ONETOUCH VERIO) High SOLN Used to check accuracy of blood glucose machine. 1 each 0   chlorpheniramine (CHLOR-TRIMETON) 4 MG tablet Take 1 tablet (4 mg total) by mouth at bedtime as needed for allergies or rhinitis. 90 tablet 1   Cholecalciferol (VITAMIN D3) 50 MCG (2000 UT) TABS Take 1 tablet by mouth daily. 90 tablet 3   ciclopirox (PENLAC) 8 % solution Apply topically at bedtime. Apply over nail and surrounding skin. Apply daily over previous coat. After seven (7) days, may remove with alcohol and continue cycle.     Coenzyme Q10 (COQ10 PO) Take by mouth daily.     conjugated estrogens (PREMARIN) vaginal cream Place vaginally 2 (two) times a week. INSERT 0.5 APPLICATORS FULL VAGINALLY TWICE PER WEEK. AT BEDTIME. 30 g 4   cyanocobalamin (VITAMIN B12) 1000 MCG/ML injection Inject 1 mL (1,000 mcg total) into the muscle every 30 (thirty) days. 3 mL 3   cycloSPORINE (RESTASIS) 0.05 % ophthalmic emulsion Place 1 drop into both eyes 2 (two) times daily. 3 each 3   desonide (DESOWEN) 0.05 % cream Apply topically as needed.     diazepam (VALIUM) 5 MG tablet Take 1 tablet (5 mg total) by mouth every 12 (twelve) hours as needed (Vertigo). 30 tablet 0   Docusate Calcium (STOOL SOFTENER PO) Take 1 tablet by mouth as needed.     EPINEPHrine 0.3 mg/0.3  mL IJ SOAJ injection 0.3 mg by injection route. 1 each 1   estradiol (VIVELLE-DOT) 0.1 MG/24HR patch Place 1 patch (0.1 mg total) onto the skin 2 (two) times a week. 24 patch 3   famotidine (PEPCID) 20 MG tablet Take 1 tablet (20 mg total) by mouth daily as needed for heartburn or indigestion. 90 tablet 3   fexofenadine (ALLEGRA ALLERGY) 180 MG tablet Take 1 tablet (180 mg total) by mouth daily. 90 tablet 3   fluticasone (FLONASE) 50 MCG/ACT nasal spray Place 1 spray into both nostrils daily. 48 g 3   glucose blood (ONETOUCH VERIO) test strip Used to check blood sugars 3 times daily. 300 each 3   hydrocortisone (ANUSOL-HC) 2.5 % rectal cream Place 1 Application rectally 2 (two) times daily. For 10 days and then as needed 30 g 4   ipratropium (ATROVENT) 0.03 % nasal spray Place 2 sprays into both nostrils 3 (three) times daily as needed for rhinitis. 90 mL 3   levothyroxine (SYNTHROID) 50 MCG tablet Take 1 tablet (50 mcg total) by mouth daily before breakfast. 90 tablet 3   losartan (COZAAR) 50 MG tablet TAKE 1 TABLET BY MOUTH EVERY DAY 90 tablet 2   MAGNESIUM PO Take 250 mg by mouth daily.     meclizine (ANTIVERT) 25 MG tablet Take 1 tablet (25 mg total) by mouth 3 (three) times daily as needed (vertigo). 84 tablet 3   metoprolol succinate (TOPROL-XL) 50 MG 24 hr tablet TAKE 1 TABLET BY MOUTH EVERY DAY 90 tablet 0   Miconazole Nitrate (LOTRIMIN AF) 2 % AERO Apply topically.     montelukast (SINGULAIR) 10 MG tablet Take 1  tablet (10 mg total) by mouth at bedtime. 90 tablet 1   Multiple Vitamin (MULTIVITAMIN) tablet Take 1 tablet by mouth daily.     mupirocin ointment (BACTROBAN) 2 % Apply topically as needed.     NONFORMULARY OR COMPOUNDED ITEM Spironolactone (PF) 0.005 mg opth. Both eyes 4 times daily     omega-3 acid ethyl esters (LOVAZA) 1 g capsule Take 1 capsule (1 g total) by mouth 2 (two) times daily. 90 capsule 3   ondansetron (ZOFRAN ODT) 4 MG disintegrating tablet Take 1 tablet (4 mg  total) by mouth every 8 (eight) hours as needed for nausea or vomiting. 30 tablet 2   OneTouch Delica Lancets 33G MISC Used to check blood sugars twice daly. 300 each 3   Polyethyl Glycol-Propyl Glycol 0.4-0.3 % SOLN Apply to eye.     Probiotic Product (PROBIOTIC DAILY PO) Take by mouth.     Propylene Glycol 0.6 % SOLN Apply 1 drop to eye as needed.     Spacer/Aero-Holding Chambers (AEROCHAMBER MV) inhaler Use as instructed 1 each 0   Syringe/Needle, Disp, (SYRINGE 3CC/20GX1") 20G X 1" 3 ML MISC 1 Syringe by Does not apply route every 30 (thirty) days. For use with b-12 injection monthly 50 each 0   Tiotropium Bromide Monohydrate (SPIRIVA RESPIMAT) 1.25 MCG/ACT AERS Inhale 2 puffs into the lungs daily. 3 each 3   tretinoin (RETIN-A) 0.05 % cream Apply topically at bedtime. 45 g 1   urea (CARMOL) 40 % CREA Apply topically.     Wheat Dextrin (BENEFIBER DRINK MIX PO) Take by mouth. Use as directed     loteprednol (LOTEMAX) 0.2 % SUSP Place 1 drop into both eyes 2 (two) times daily. (Patient not taking: Reported on 11/25/2023)     No current facility-administered medications for this visit.    Allergies  Allergen Reactions   Codeine Other (See Comments)    Low blood pressure/nausea   Olodaterol Palpitations   Qvar [Beclomethasone] Other (See Comments)    Thrush per patient even with rinsing.    Sulfonamide Derivatives Rash    rash    Review of Systems:  neg    Physical Exam:    BP 124/72   Pulse 72   Ht 5\' 5"  (1.651 m)   Wt 179 lb (81.2 kg)   BMI 29.79 kg/m  Wt Readings from Last 3 Encounters:  11/25/23 179 lb (81.2 kg)  10/12/23 171 lb 12.8 oz (77.9 kg)  09/18/23 174 lb 9.6 oz (79.2 kg)   Constitutional:  Well-developed, in no acute distress. Psychiatric: Normal mood and affect. Behavior is normal. HEENT: Pupils normal.  Conjunctivae are normal. No scleral icterus. Abdominal: Soft, nondistended.  Mild left upper quadrant abdominal tenderness without rebound.. Bowel sounds  active throughout. There are no masses palpable. No hepatomegaly. Neurological: Alert and oriented to person place and time. Skin: Skin is warm and dry. No rashes noted.  Data Reviewed: I have personally reviewed following labs and imaging studies  CBC:    Latest Ref Rng & Units 05/04/2023   10:26 AM 04/14/2023   11:03 AM 02/10/2023   12:27 PM  CBC  WBC 4.0 - 10.5 K/uL 5.4  6.1  6.7   Hemoglobin 12.0 - 15.0 g/dL 40.9  81.1  91.4   Hematocrit 36.0 - 46.0 % 40.1  37.4  35.8   Platelets 150 - 400 K/uL 261  321.0  290     CMP:    Latest Ref Rng & Units 05/04/2023  10:26 AM 04/14/2023   11:03 AM 02/10/2023   12:27 PM  CMP  Glucose 70 - 99 mg/dL 161  99  096   BUN 8 - 23 mg/dL 11  9  15    Creatinine 0.44 - 1.00 mg/dL 0.45  4.09  8.11   Sodium 135 - 145 mmol/L 136  136  133   Potassium 3.5 - 5.1 mmol/L 4.2  4.0  4.0   Chloride 98 - 111 mmol/L 102  102  100   CO2 22 - 32 mmol/L 25  27  23    Calcium 8.9 - 10.3 mg/dL 9.3  9.2  8.8   Total Protein 6.5 - 8.1 g/dL 7.7  7.7    Total Bilirubin 0.3 - 1.2 mg/dL 1.1  0.8    Alkaline Phos 38 - 126 U/L 62  64    AST 15 - 41 U/L 25  22    ALT 0 - 44 U/L 25  21          Edman Circle, MD 11/25/2023, 10:30 AM  Cc: Sherlene Shams, MD

## 2023-11-27 DIAGNOSIS — H2513 Age-related nuclear cataract, bilateral: Secondary | ICD-10-CM | POA: Diagnosis not present

## 2023-11-27 DIAGNOSIS — H0288B Meibomian gland dysfunction left eye, upper and lower eyelids: Secondary | ICD-10-CM | POA: Diagnosis not present

## 2023-11-27 DIAGNOSIS — H0288A Meibomian gland dysfunction right eye, upper and lower eyelids: Secondary | ICD-10-CM | POA: Diagnosis not present

## 2023-11-30 ENCOUNTER — Encounter: Payer: Self-pay | Admitting: Internal Medicine

## 2023-11-30 ENCOUNTER — Ambulatory Visit (INDEPENDENT_AMBULATORY_CARE_PROVIDER_SITE_OTHER): Payer: Medicare Other | Admitting: Internal Medicine

## 2023-11-30 ENCOUNTER — Telehealth: Payer: Self-pay

## 2023-11-30 VITALS — BP 120/74 | HR 75 | Ht 65.0 in | Wt 175.0 lb

## 2023-11-30 DIAGNOSIS — G4733 Obstructive sleep apnea (adult) (pediatric): Secondary | ICD-10-CM | POA: Diagnosis not present

## 2023-11-30 DIAGNOSIS — R059 Cough, unspecified: Secondary | ICD-10-CM

## 2023-11-30 DIAGNOSIS — M1990 Unspecified osteoarthritis, unspecified site: Secondary | ICD-10-CM

## 2023-11-30 DIAGNOSIS — J4541 Moderate persistent asthma with (acute) exacerbation: Secondary | ICD-10-CM | POA: Diagnosis not present

## 2023-11-30 LAB — POCT EXHALED NITRIC OXIDE: FeNO level (ppb): 37

## 2023-11-30 MED ORDER — AZITHROMYCIN 250 MG PO TABS: ORAL_TABLET | ORAL | 0 refills | Status: DC

## 2023-11-30 MED ORDER — PREDNISONE 10 MG PO TABS
10.0000 mg | ORAL_TABLET | Freq: Every day | ORAL | 1 refills | Status: DC
Start: 2023-11-30 — End: 2023-12-22

## 2023-11-30 NOTE — Patient Instructions (Signed)
Continue CPAP as prescribed  Continue inhalers and antihistamines as prescribed  Can use Mucinex as needed  Prednisone 10 mg daily for 5 days Start Z-Pak   Avoid Allergens and Irritants Avoid secondhand smoke Avoid SICK contacts Recommend  Masking  when appropriate Recommend Keep up-to-date with vaccinations   Be aware of reduced alertness and do not drive or operate heavy machinery if experiencing this or drowsiness.  Exercise encouraged, as tolerated. Encouraged proper weight management.  Important to get eight or more hours of sleep  Limiting the use of the computer and television before bedtime.  Decrease naps during the day, so night time sleep will become enhanced.  Limit caffeine, and sleep deprivation.

## 2023-11-30 NOTE — Progress Notes (Signed)
[facility]   Brief Summary:  73 year old female with asthma and OSA    Past Medical History:  Vit D deficiency, Vertigo, Rosacea, Hypothyroidism, HTN, HLD, GERD, Diverticulosis, Depression, HA, COVID 12 February 2021  Past Surgical History:  She  has a past surgical history that includes Cholecystectomy (2007); Appendectomy (2007); Tubal ligation; Total abdominal hysterectomy (1991); Nasal sinus surgery; Breast enhancement surgery (2006); Cardiovascular stress test (2013); Colonoscopy (08/2012); Esophagogastroduodenoscopy (08/2012); US ECHOCARDIOGRAPHY (09/2009); US ECHOCARDIOGRAPHY (11/2007); sleep study (08/2007); Rectocele repair (12/2008); Colonoscopy (2006); Breast biopsy (Left, 09/27/2009); Hernia repair; bladder tack; tummy tuck; Esophagogastroduodenoscopy (11/02/2008); Cesarean section; bladder tack (08/2022); and Trigger finger release (Right, 06/18/2023).     CC Follow-up assessment for OSA Follow-up assessment for asthma   Subjective:   Re:OSA Excellent compliance Uses and benefits from therapy AHI reduced Less fatigue more energy  IH:[id number] Cough for 3 weeks Diagnosed in 2004 Multiple inhalers in the past Patient states that Spiriva works the best FENO is 37 significant of increased inflammatory status Plan for prednisone and Z-Pak  Multiple nasal sprays and antihistamines Triggers, cold air, perfumes, dust, carpet in bedroom   + exacerbation at this time No evidence of heart failure at this time No evidence or signs of infection at this time No respiratory distress No fevers, chills, nausea, vomiting, diarrhea No evidence of lower extremity edema No evidence hemoptysis   Physical Exam:  BP 120/74   Pulse 75   Ht 5\' 5"  (1.651 m)   Wt 175 lb (79.4 kg)   SpO2 98%   BMI 29.12 kg/m        Review of Systems: Gen:  Denies  fever, sweats, chills weight loss  HEENT: Denies blurred vision, double vision, ear  pain, eye pain, hearing loss, nose bleeds, sore throat Cardiac:  No dizziness, chest pain or heaviness, chest tightness,edema, No JVD Resp: + cough, -sputum production, -shortness of breath,+wheezing, -hemoptysis,  Other:  All other systems negative   Physical Examination:   General Appearance: No distress  EYES PERRLA, EOM intact.   NECK Supple, No JVD Pulmonary: normal breath sounds, No wheezing.  CardiovascularNormal S1,S2.  No m/r/g.   Abdomen: Benign, Soft, non-tender. Neurology UE/LE 5/5 strength, no focal deficits Ext pulses intact, cap refill intact ALL OTHER ROS ARE NEGATIVE      Pulmonary testing:  Spirometry 12/12 >> FEV1% 68 RAST 11/15/14 >> IgE 30, cats/mold PFT 02/07/16 >> FEV1 2.32 (92%), FEV1% 76, FEF 25-75% 1.56 (71%), TLC 4.25 (81%), DLCO 121, borderline BD from FEF 25-75 FeNO 12/01/18 >> 7 IgE 12/16/21 >> 23  Chest Imaging:  Coronary CT 04/04/21 >> stable 2 mm nodule LLL, coronary calcium score 0 CT sinus 01/09/22 >> normal CT angio chest 07/18/22 >> mild dependent atelectasis  Sleep Tests:  PSG 09/06/07 >> AHI 19 CPAP 05/17/23 to 06/15/23 >> used on 30 of 30 nights with average 7 hrs 15 min.  Average AHI 0.7 with median CPAP 6 and 95 th percentile CPAP 8 cm H2O CPAP DL 56/3875 AutoCPAP 6-43       AHI 1, 100% compliance days >4hrs  Cardiac Tests:  Echo 01/23/21 >> EF 60 to 65%, grade 2 DD, RVSP 39.1 mmHg, mild MR  Social History:  She  reports that she quit smoking about 46 years ago. Her smoking use included cigarettes. She started smoking about 53 years ago. She has a 2.1 pack-year smoking history. She has been exposed to tobacco smoke. She has never used smokeless tobacco.  She reports that she does not drink alcohol and does not use drugs.  Family History:  Her family history includes AAA (abdominal aortic aneurysm) in her paternal uncle; Aortic dissection in her maternal aunt and paternal uncle; Breast cancer in her maternal aunt; COPD in her sister; Cancer  in her cousin and paternal aunt; Cancer (age of onset: 43) in her father; Cancer (age of onset: 74) in her mother; Colon polyps in her sister; Heart attack in her paternal uncle; Heart disease in her paternal uncle; Heart failure in her sister; Hypertension in her sister; Stroke in her paternal uncle and sister.     Assessment/Plan:  73 year old pleasant white female seen today for follow-up assessment for underlying sleep apnea also underlying asthma with chronic allergic rhinitis  Assessment of sleep apnea Patient is very compliant with her CPAP Uses and benefits from therapy Continue auto CPAP 5-10 AHI significant reduced Well-controlled OSA Be aware of reduced alertness and do not drive or operate heavy machinery if experiencing this or drowsiness.  Exercise encouraged, as tolerated. Encouraged proper weight management.  Important to get eight or more hours of sleep  Limiting the use of the computer and television before bedtime.  Decrease naps during the day, so night time sleep will become enhanced.  Limit caffeine, and sleep deprivation.    Chronic allergic rhinitis Continue nasal sprays Flonase and Zyrtec   Allergic asthma-asthma exacerbation at this time FENO check 37 Consistent with asthma exacerbation likely related to acute exposure to cold air and viral bronchitis Plan to start prednisone to 10 mg daily for 5 days Start Z-Pak   Avoid Allergens and Irritants Avoid secondhand smoke Avoid SICK contacts Recommend  Masking  when appropriate Recommend Keep up-to-date with vaccinations      Medication List:   Allergies as of 11/30/2023       Reactions   Codeine Other (See Comments)   Low blood pressure/nausea   Olodaterol Palpitations   Qvar [beclomethasone] Other (See Comments)   Thrush per patient even with rinsing.    Sulfonamide Derivatives Rash   rash        Medication List        Accurate as of November 30, 2023  1:21 PM. If you have any  questions, ask your nurse or doctor.          AeroChamber MV inhaler Use as instructed   albuterol 108 (90 Base) MCG/ACT inhaler Commonly known as: Proventil HFA Inhale 2 puffs into the lungs every 6 (six) hours as needed.   ALPRAZolam 0.25 MG tablet Commonly known as: XANAX TAKE 1 TABLET (0.25 MG TOTAL) BY MOUTH ONCE DAILY AS NEEDED FOR SLEEP.   atorvastatin 40 MG tablet Commonly known as: LIPITOR Take 1 tablet (40 mg total) by mouth daily.   azelastine 0.1 % nasal spray Commonly known as: ASTELIN Place 2 sprays into both nostrils 2 (two) times daily. Use in each nostril as directed   BENEFIBER DRINK MIX PO Take by mouth. Use as directed   benzonatate 200 MG capsule Commonly known as: TESSALON Take 1 capsule (200 mg total) by mouth 3 (three) times daily as needed for cough.   chlorpheniramine 4 MG tablet Commonly known as: CHLOR-TRIMETON Take 1 tablet (4 mg total) by mouth at bedtime as needed for allergies or rhinitis.   ciclopirox 8 % solution Commonly known as: PENLAC Apply topically at bedtime. Apply over nail and surrounding skin. Apply daily over previous coat. After seven (7) days, may remove with alcohol  and continue cycle.   COQ10 PO Take by mouth daily.   cyanocobalamin 1000 MCG/ML injection Commonly known as: VITAMIN B12 Inject 1 mL (1,000 mcg total) into the muscle every 30 (thirty) days.   cycloSPORINE 0.05 % ophthalmic emulsion Commonly known as: RESTASIS Place 1 drop into both eyes 2 (two) times daily.   desonide 0.05 % cream Commonly known as: DESOWEN Apply topically as needed.   diazepam 5 MG tablet Commonly known as: VALIUM Take 1 tablet (5 mg total) by mouth every 12 (twelve) hours as needed (Vertigo).   EPINEPHrine 0.3 mg/0.3 mL Soaj injection Commonly known as: EPI-PEN 0.3 mg by injection route.   estradiol 0.1 MG/24HR patch Commonly known as: VIVELLE-DOT Place 1 patch (0.1 mg total) onto the skin 2 (two) times a week.    famotidine 20 MG tablet Commonly known as: PEPCID Take 1 tablet (20 mg total) by mouth daily as needed for heartburn or indigestion.   fexofenadine 180 MG tablet Commonly known as: Allegra Allergy Take 1 tablet (180 mg total) by mouth daily.   fluticasone 50 MCG/ACT nasal spray Commonly known as: FLONASE Place 1 spray into both nostrils daily.   hydrocortisone 2.5 % rectal cream Commonly known as: ANUSOL-HC Place 1 Application rectally 2 (two) times daily. For 10 days and then as needed   ipratropium 0.03 % nasal spray Commonly known as: ATROVENT Place 2 sprays into both nostrils 3 (three) times daily as needed for rhinitis.   levothyroxine 50 MCG tablet Commonly known as: Synthroid Take 1 tablet (50 mcg total) by mouth daily before breakfast.   losartan 50 MG tablet Commonly known as: COZAAR TAKE 1 TABLET BY MOUTH EVERY DAY   loteprednol 0.2 % Susp Commonly known as: LOTEMAX Place 1 drop into both eyes 2 (two) times daily.   Lotrimin AF 2 % Aero Generic drug: Miconazole Nitrate Apply topically.   MAGNESIUM PO Take 250 mg by mouth daily.   meclizine 25 MG tablet Commonly known as: ANTIVERT Take 1 tablet (25 mg total) by mouth 3 (three) times daily as needed (vertigo).   metoprolol succinate 50 MG 24 hr tablet Commonly known as: TOPROL-XL TAKE 1 TABLET BY MOUTH EVERY DAY   montelukast 10 MG tablet Commonly known as: SINGULAIR Take 1 tablet (10 mg total) by mouth at bedtime.   multivitamin tablet Take 1 tablet by mouth daily.   mupirocin ointment 2 % Commonly known as: BACTROBAN Apply topically as needed.   NONFORMULARY OR COMPOUNDED ITEM Spironolactone (PF) 0.005 mg opth. Both eyes 4 times daily   omega-3 acid ethyl esters 1 g capsule Commonly known as: Lovaza Take 1 capsule (1 g total) by mouth 2 (two) times daily.   ondansetron 4 MG disintegrating tablet Commonly known as: Zofran ODT Take 1 tablet (4 mg total) by mouth every 8 (eight) hours as  needed for nausea or vomiting.   OneTouch Delica Lancets 33G Misc Used to check blood sugars twice daly.   OneTouch Verio High Soln Used to check accuracy of blood glucose machine.   OneTouch Verio test strip Generic drug: glucose blood Used to check blood sugars 3 times daily.   Polyethyl Glycol-Propyl Glycol 0.4-0.3 % Soln Apply to eye.   Premarin vaginal cream Generic drug: conjugated estrogens Place vaginally 2 (two) times a week. INSERT 0.5 APPLICATORS FULL VAGINALLY TWICE PER WEEK. AT BEDTIME.   PROBIOTIC DAILY PO Take by mouth.   Propylene Glycol 0.6 % Soln Apply 1 drop to eye as needed.  Spiriva Respimat 1.25 MCG/ACT Aers Generic drug: Tiotropium Bromide Monohydrate Inhale 2 puffs into the lungs daily.   STOOL SOFTENER PO Take 1 tablet by mouth as needed.   SYRINGE 3CC/20GX1" 20G X 1" 3 ML Misc 1 Syringe by Does not apply route every 30 (thirty) days. For use with b-12 injection monthly   tretinoin 0.05 % cream Commonly known as: RETIN-A Apply topically at bedtime.   urea 40 % Crea Commonly known as: CARMOL Apply topically.   Vitamin D3 50 MCG (2000 UT) Tabs Take 1 tablet by mouth daily.        MEDICATION ADJUSTMENTS/LABS AND TESTS ORDERED: Continue CPAP as prescribed Continue inhalers and antihistamines as prescribed Can use Mucinex as needed Prednisone 10 mg daily for 5 days Start Z-Pak  Avoid Allergens and Irritants Avoid secondhand smoke Avoid SICK contacts Recommend  Masking  when appropriate Recommend Keep up-to-date with vaccinations     CURRENT MEDICATIONS REVIEWED AT LENGTH WITH PATIENT TODAY   Patient  satisfied with Plan of action and management. All questions answered   Follow up 6 months   I spent a total of 43 minutes reviewing chart data, face-to-face evaluation with the patient, counseling and coordination of care as detailed above.      Lucie Leather, M.D.  [facility]   Medical Director [facility] Medical Director [facility] Cardio-Pulmonary Department

## 2023-11-30 NOTE — Telephone Encounter (Signed)
How long have your symptoms been going on for? 3 weeks Any fevers, chills or sweats? Chills. No fever. Any cough? yes Any SOB? yes Any wheezing? occasional Do you monitor your oxygen at home? 97 Do you wear O2? no  What medications do you take? Delsym, reports good cough control.  Covid test? No  Flu test? No

## 2023-12-02 ENCOUNTER — Other Ambulatory Visit: Payer: Self-pay | Admitting: Vascular Surgery

## 2023-12-02 DIAGNOSIS — Z1231 Encounter for screening mammogram for malignant neoplasm of breast: Secondary | ICD-10-CM

## 2023-12-04 ENCOUNTER — Telehealth: Payer: Self-pay

## 2023-12-04 NOTE — Telephone Encounter (Signed)
 Copied from CRM [id number]. Topic: Appointments - Scheduling Inquiry for Clinic >> Dec 04, 2023 12:04 PM Chuck Crater wrote: Reason for CRM: Patient wants to schedule an appointment for a TB skin test.

## 2023-12-07 NOTE — Telephone Encounter (Signed)
 LMTCB. Need to find out why pt is needing the TB skin test.

## 2023-12-09 NOTE — Telephone Encounter (Signed)
LMTCB

## 2023-12-09 NOTE — Telephone Encounter (Signed)
Pt has recently seen Dr. Belia Heman pulmonologist should pt talk with Dr. Belia Heman about this or would you like for her to schedule an appointment to discuss with you?

## 2023-12-09 NOTE — Telephone Encounter (Signed)
Copied from CRM [id number]. Topic: General - Other >> Dec 09, 2023  3:48 PM Kathryne Eriksson wrote: Reason for CRM: Returning Office Call >> Dec 09, 2023  3:52 PM Kathryne Eriksson wrote: Patient states she was wanting the TB skin test because she's seen on the news that the ratings are rising and also because she's had this cough.. Patient states she's had her lungs listened to and was told that her lungs didn't sound right, as well as was instructed to blow inside the little machine and was told she has a high level of information. Patient is requesting a call back to further explain.

## 2023-12-10 NOTE — Telephone Encounter (Signed)
Spoke with pt and scheduled her for a nurse to have ppd placed and labs drawn. Pt is aware appt date and time.

## 2023-12-14 ENCOUNTER — Other Ambulatory Visit (INDEPENDENT_AMBULATORY_CARE_PROVIDER_SITE_OTHER): Payer: Medicare Other

## 2023-12-14 ENCOUNTER — Ambulatory Visit: Payer: Medicare Other

## 2023-12-14 DIAGNOSIS — E119 Type 2 diabetes mellitus without complications: Secondary | ICD-10-CM | POA: Diagnosis not present

## 2023-12-14 DIAGNOSIS — I10 Essential (primary) hypertension: Secondary | ICD-10-CM | POA: Diagnosis not present

## 2023-12-14 DIAGNOSIS — K644 Residual hemorrhoidal skin tags: Secondary | ICD-10-CM | POA: Diagnosis not present

## 2023-12-14 DIAGNOSIS — E034 Atrophy of thyroid (acquired): Secondary | ICD-10-CM

## 2023-12-14 LAB — COMPREHENSIVE METABOLIC PANEL
ALT: 21 U/L (ref 0–35)
AST: 20 U/L (ref 0–37)
Albumin: 4.5 g/dL (ref 3.5–5.2)
Alkaline Phosphatase: 55 U/L (ref 39–117)
BUN: 12 mg/dL (ref 6–23)
CO2: 28 meq/L (ref 19–32)
Calcium: 9.3 mg/dL (ref 8.4–10.5)
Chloride: 101 meq/L (ref 96–112)
Creatinine, Ser: 0.84 mg/dL (ref 0.40–1.20)
GFR: 69.1 mL/min (ref >60.00)
Glucose, Bld: 96 mg/dL (ref 70–99)
Potassium: 4.1 meq/L (ref 3.5–5.1)
Sodium: 137 meq/L (ref 135–145)
Total Bilirubin: 1 mg/dL (ref 0.2–1.2)
Total Protein: 7.4 g/dL (ref 6.0–8.3)

## 2023-12-14 LAB — TSH: TSH: 3.37 u[IU]/mL (ref 0.35–5.50)

## 2023-12-14 LAB — MICROALBUMIN / CREATININE URINE RATIO
Creatinine,U: 92.7 mg/dL
Microalb Creat Ratio: 7.5 mg/g (ref 0.0–30.0)
Microalb, Ur: <0.7 mg/dL (ref 0.0–1.9)

## 2023-12-14 LAB — HEMOGLOBIN A1C: Hgb A1c MFr Bld: 6.3 % (ref 4.6–6.5)

## 2023-12-14 NOTE — Progress Notes (Unsigned)
Pt didn't receive ppd as she wanted to reschedule but wanted to get labs appt was cancelled and pt had labs drawn instead.

## 2023-12-15 ENCOUNTER — Encounter: Payer: Self-pay | Admitting: Internal Medicine

## 2023-12-15 LAB — LIPID PANEL W/REFLEX DIRECT LDL
Cholesterol: 159 mg/dL (ref <200)
HDL: 52 mg/dL (ref >=50)
LDL Cholesterol (Calc): 79 mg/dL
Non-HDL Cholesterol (Calc): 107 mg/dL (ref <130)
Total CHOL/HDL Ratio: 3.1 (calc) (ref <5.0)
Triglycerides: 185 mg/dL — ABNORMAL HIGH (ref <150)

## 2023-12-15 LAB — IRON,TIBC AND FERRITIN PANEL
%SAT: 29 % (ref 16–45)
Ferritin: 110 ng/mL (ref 16–288)
Iron: 103 ug/dL (ref 45–160)
TIBC: 354 ug/dL (ref 250–450)

## 2023-12-17 ENCOUNTER — Other Ambulatory Visit: Payer: Medicare Other

## 2023-12-22 ENCOUNTER — Encounter: Payer: Self-pay | Admitting: Internal Medicine

## 2023-12-22 ENCOUNTER — Ambulatory Visit (INDEPENDENT_AMBULATORY_CARE_PROVIDER_SITE_OTHER): Payer: Medicare Other | Admitting: Internal Medicine

## 2023-12-22 ENCOUNTER — Telehealth: Payer: Self-pay | Admitting: Cardiovascular Disease

## 2023-12-22 VITALS — BP 126/74 | HR 69 | Ht 65.0 in | Wt 173.8 lb

## 2023-12-22 DIAGNOSIS — R058 Other specified cough: Secondary | ICD-10-CM

## 2023-12-22 DIAGNOSIS — H8109 Meniere's disease, unspecified ear: Secondary | ICD-10-CM

## 2023-12-22 DIAGNOSIS — I480 Paroxysmal atrial fibrillation: Secondary | ICD-10-CM | POA: Diagnosis not present

## 2023-12-22 DIAGNOSIS — Z111 Encounter for screening for respiratory tuberculosis: Secondary | ICD-10-CM

## 2023-12-22 DIAGNOSIS — E119 Type 2 diabetes mellitus without complications: Secondary | ICD-10-CM

## 2023-12-22 DIAGNOSIS — I10 Essential (primary) hypertension: Secondary | ICD-10-CM

## 2023-12-22 MED ORDER — SYRINGE 20G X 1" 3 ML MISC: 1.0000 | 0 refills | Status: AC

## 2023-12-22 MED ORDER — MONTELUKAST SODIUM 10 MG PO TABS: 10.0000 mg | ORAL_TABLET | Freq: Every day | ORAL | 1 refills | Status: DC

## 2023-12-22 MED ORDER — CETIRIZINE HCL 10 MG PO TABS
10.0000 mg | ORAL_TABLET | Freq: Every day | ORAL | 3 refills | Status: DC
Start: 2023-12-22 — End: 2024-03-03

## 2023-12-22 MED ORDER — CYCLOSPORINE 0.05 % OP EMUL: 1.0000 [drp] | Freq: Two times a day (BID) | OPHTHALMIC | 3 refills | Status: AC

## 2023-12-22 MED ORDER — ALPRAZOLAM 0.25 MG PO TABS: ORAL_TABLET | ORAL | 5 refills | Status: AC

## 2023-12-22 MED ORDER — CYANOCOBALAMIN 1000 MCG/ML IJ SOLN: 1000.0000 ug | INTRAMUSCULAR | 3 refills | Status: DC

## 2023-12-22 NOTE — Assessment & Plan Note (Signed)
 Continue metoprolol dose  25 mg daily

## 2023-12-22 NOTE — Patient Instructions (Addendum)
 Here are some prepared foods that will help keep you from blowing your  low GI  diet  ON "HIDDEN CALORIE" MEALS, because:   Oatmeal has more carbs and less fiber than you think.Marland KitchenMarland KitchenRead the label     [place] yogurt is loaded with sugar unless you choose unflavored (NOT VANILLA ) .   TRY one of these brands that use artificial or natural low glycemic index sweeteners:  Dannon Lt n Fit Greek 2 Good 2 Be True Oikos Triple Zero   You can also  a premixed protein drink   Premier Protein' Fairlife Muscle Milk Atkins Aadvantage   .    Vilma Meckel and Veggies made Randie [employer] are two companies that make individual servings of  low carb frittata's (quiche without the crust)   RealGood has a line of low carb entree's  and "fun food"  including enchiladas and  bacon wrapped jalapeno's,   Healthy Choice  "low carb power bowl"  entrees and  "Steamer" entrees are are great low carb entrees that microwave in 5   MIN   AND aure usually < 250  CAL  and < 12 NET CARBS   Healthy /choice makes low cal/carb fudgsicles and brownies  , and KIND makes low carb mini sized bars  for snacks

## 2023-12-22 NOTE — Assessment & Plan Note (Addendum)
 Treated by Dr Belia Heman in early Feb with prednisone and z pack for flare.    - - continue allegra during the day - continue flonase daily, azelastine bid, singulair qhs - continue chlorpheniramine 4 mg nightly - continue tessalon 200 mg tid prn - continue atrovent nasal spray tid prn

## 2023-12-22 NOTE — Telephone Encounter (Signed)
*  STAT* If patient is at the pharmacy, call can be transferred to refill team.   1. Which medications need to be refilled? (please list name of each medication and dose if known)  atorvastatin (LIPITOR) 40 MG tablet  atorvastatin (LIPITOR) 40 MG tablet   metoprolol succinate (TOPROL-XL) 50 MG 24 hr tablet   2. Which pharmacy/location (including street and city if local pharmacy) is medication to be sent to? [facility] - [place], [place] - [address] Phone: [phone]  Fax: [phone]     3. Do they need a 30 day or 90 day supply? 90

## 2023-12-22 NOTE — Progress Notes (Signed)
 Subjective:  Patient ID: Angela Mendoza, female    DOB: 10/02/1951  Age: 73 y.o. MRN: [id number]  CC: The primary encounter diagnosis was Controlled type 2 diabetes mellitus without complication, without long-term current use of insulin (HCC). Diagnoses of Upper airway cough syndrome, Screening for tuberculosis, Paroxysmal atrial fibrillation (HCC), and Meniere's disease, unspecified laterality were also pertinent to this visit.   HPI Angela Mendoza presents for  Chief Complaint  Patient presents with   Medical Management of Chronic Issues    1) Type 2 DM (by A1c of 6.5  in 2023):  has been overeating . Not exercising due to cold weather and distractions due to impending move to [facility] . Has questions about artifical sugars,  sugar free ice cream   Lab Results  Component Value Date   HGBA1C 6.3 12/14/2023   2) cough variant asthma:  sees [facility].  Referred to rheumatology  + Has been coughing more frequently  wants to be tested for TB .  Both children  were remotely and POSITIVE AND SHE HAS BEEN TESTED SINCE THEN AN NEGATIVE   3) HLD:  taking atorvastatin 40 mg    4) HTN: taking losartan and metoprolol .   home readings similar to today . Has noticed elevated pulse to 87  at times  to due   4) Impending move to [place] delayed by renovation issues until August   5) GAD:  husband now on chronic oxygen . Not sleeping well   6( history of vertigo in January:  treated with diazepam by Dr Birdie Sons   Outpatient Medications Prior to Visit  Medication Sig Dispense Refill   albuterol (PROVENTIL HFA) 108 (90 Base) MCG/ACT inhaler Inhale 2 puffs into the lungs every 6 (six) hours as needed. 18 g 2   atorvastatin (LIPITOR) 40 MG tablet Take 1 tablet (40 mg total) by mouth daily. 30 tablet 4   azelastine (ASTELIN) 0.1 % nasal spray Place 2 sprays into both nostrils 2 (two) times daily. Use in each nostril as directed 90 mL 3   benzonatate (TESSALON) 200 MG capsule Take 1 capsule (200 mg total)  by mouth 3 (three) times daily as needed for cough. 30 capsule 1   Blood Glucose Calibration (ONETOUCH VERIO) High SOLN Used to check accuracy of blood glucose machine. 1 each 0   chlorpheniramine (CHLOR-TRIMETON) 4 MG tablet Take 1 tablet (4 mg total) by mouth at bedtime as needed for allergies or rhinitis. 90 tablet 1   Cholecalciferol (VITAMIN D3) 50 MCG (2000 UT) TABS Take 1 tablet by mouth daily. 90 tablet 3   ciclopirox (PENLAC) 8 % solution Apply topically at bedtime. Apply over nail and surrounding skin. Apply daily over previous coat. After seven (7) days, may remove with alcohol and continue cycle.     Coenzyme Q10 (COQ10 PO) Take by mouth daily.     conjugated estrogens (PREMARIN) vaginal cream Place vaginally 2 (two) times a week. INSERT 0.5 APPLICATORS FULL VAGINALLY TWICE PER WEEK. AT BEDTIME. 30 g 4   desonide (DESOWEN) 0.05 % cream Apply topically as needed.     diazepam (VALIUM) 5 MG tablet Take 1 tablet (5 mg total) by mouth every 12 (twelve) hours as needed (Vertigo). 30 tablet 0   Docusate Calcium (STOOL SOFTENER PO) Take 1 tablet by mouth as needed.     EPINEPHrine 0.3 mg/0.3 mL IJ SOAJ injection 0.3 mg by injection route. 1 each 1   estradiol (VIVELLE-DOT) 0.1 MG/24HR patch Place 1 patch (  0.1 mg total) onto the skin 2 (two) times a week. 24 patch 3   famotidine (PEPCID) 20 MG tablet Take 1 tablet (20 mg total) by mouth daily as needed for heartburn or indigestion. 90 tablet 3   fluticasone (FLONASE) 50 MCG/ACT nasal spray Place 1 spray into both nostrils daily. 48 g 3   glucose blood (ONETOUCH VERIO) test strip Used to check blood sugars 3 times daily. 300 each 3   hydrocortisone (ANUSOL-HC) 2.5 % rectal cream Place 1 Application rectally 2 (two) times daily. For 10 days and then as needed 30 g 4   ipratropium (ATROVENT) 0.03 % nasal spray Place 2 sprays into both nostrils 3 (three) times daily as needed for rhinitis. 90 mL 3   levothyroxine (SYNTHROID) 50 MCG tablet Take 1  tablet (50 mcg total) by mouth daily before breakfast. 90 tablet 3   losartan (COZAAR) 50 MG tablet TAKE 1 TABLET BY MOUTH EVERY DAY 90 tablet 2   loteprednol (LOTEMAX) 0.2 % SUSP Place 1 drop into both eyes 2 (two) times daily.     MAGNESIUM PO Take 250 mg by mouth daily.     meclizine (ANTIVERT) 25 MG tablet Take 1 tablet (25 mg total) by mouth 3 (three) times daily as needed (vertigo). 84 tablet 3   metoprolol succinate (TOPROL-XL) 50 MG 24 hr tablet TAKE 1 TABLET BY MOUTH EVERY DAY 90 tablet 0   Miconazole Nitrate (LOTRIMIN AF) 2 % AERO Apply topically.     Multiple Vitamin (MULTIVITAMIN) tablet Take 1 tablet by mouth daily.     mupirocin ointment (BACTROBAN) 2 % Apply topically as needed.     NONFORMULARY OR COMPOUNDED ITEM Spironolactone (PF) 0.005 mg opth. Both eyes 4 times daily     omega-3 acid ethyl esters (LOVAZA) 1 g capsule Take 1 capsule (1 g total) by mouth 2 (two) times daily. 90 capsule 3   ondansetron (ZOFRAN ODT) 4 MG disintegrating tablet Take 1 tablet (4 mg total) by mouth every 8 (eight) hours as needed for nausea or vomiting. 30 tablet 2   OneTouch Delica Lancets 33G MISC Used to check blood sugars twice daly. 300 each 3   Polyethyl Glycol-Propyl Glycol 0.4-0.3 % SOLN Apply to eye.     Probiotic Product (PROBIOTIC DAILY PO) Take by mouth.     Propylene Glycol 0.6 % SOLN Apply 1 drop to eye as needed.     Spacer/Aero-Holding Chambers (AEROCHAMBER MV) inhaler Use as instructed 1 each 0   Tiotropium Bromide Monohydrate (SPIRIVA RESPIMAT) 1.25 MCG/ACT AERS Inhale 2 puffs into the lungs daily. 3 each 3   tretinoin (RETIN-A) 0.05 % cream Apply topically at bedtime. 45 g 1   urea (CARMOL) 40 % CREA Apply topically.     Wheat Dextrin (BENEFIBER DRINK MIX PO) Take by mouth. Use as directed     ALPRAZolam (XANAX) 0.25 MG tablet TAKE 1 TABLET (0.25 MG TOTAL) BY MOUTH ONCE DAILY AS NEEDED FOR SLEEP. 30 tablet 5   cetirizine (ZYRTEC) 10 MG tablet Take 10 mg by mouth daily.      cyanocobalamin (VITAMIN B12) 1000 MCG/ML injection Inject 1 mL (1,000 mcg total) into the muscle every 30 (thirty) days. 3 mL 3   cycloSPORINE (RESTASIS) 0.05 % ophthalmic emulsion Place 1 drop into both eyes 2 (two) times daily. 3 each 3   montelukast (SINGULAIR) 10 MG tablet Take 1 tablet (10 mg total) by mouth at bedtime. 90 tablet 1   Syringe/Needle, Disp, (SYRINGE 3CC/20GX1") 20G X  1" 3 ML MISC 1 Syringe by Does not apply route every 30 (thirty) days. For use with b-12 injection monthly 50 each 0   azithromycin (ZITHROMAX Z-PAK) 250 MG tablet Take 2 tablets on Day 1 and then 1 tablet daily till gone. 6 each 0   fexofenadine (ALLEGRA ALLERGY) 180 MG tablet Take 1 tablet (180 mg total) by mouth daily. 90 tablet 3   predniSONE (DELTASONE) 10 MG tablet Take 1 tablet (10 mg total) by mouth daily with breakfast. 5 days 5 tablet 1   No facility-administered medications prior to visit.    Review of Systems;  Patient denies headache, fevers, malaise, unintentional weight loss, skin rash, eye pain, sinus congestion and sinus pain, sore throat, dysphagia,  hemoptysis , cough, dyspnea, wheezing, chest pain, palpitations, orthopnea, edema, abdominal pain, nausea, melena, diarrhea, constipation, flank pain, dysuria, hematuria, urinary  Frequency, nocturia, numbness, tingling, seizures,  Focal weakness, Loss of consciousness,  Tremor, insomnia, depression, anxiety, and suicidal ideation.      Objective:  BP 126/74   Pulse 69   Ht 5\' 5"  (1.651 m)   Wt 173 lb 12.8 oz (78.8 kg)   SpO2 97%   BMI 28.92 kg/m   BP Readings from Last 3 Encounters:  12/22/23 126/74  11/30/23 120/74  11/25/23 124/72    Wt Readings from Last 3 Encounters:  12/22/23 173 lb 12.8 oz (78.8 kg)  11/30/23 175 lb (79.4 kg)  11/25/23 179 lb (81.2 kg)    Physical Exam  Lab Results  Component Value Date   HGBA1C 6.3 12/14/2023   HGBA1C 6.1 (H) 06/19/2023   HGBA1C 6.3 11/19/2022    Lab Results  Component Value Date    CREATININE 0.84 12/14/2023   CREATININE 0.73 05/04/2023   CREATININE 0.82 04/14/2023    Lab Results  Component Value Date   WBC 5.4 05/04/2023   HGB 13.0 05/04/2023   HCT 40.1 05/04/2023   PLT 261 05/04/2023   GLUCOSE 96 12/14/2023   CHOL 159 12/14/2023   TRIG 185 (H) 12/14/2023   HDL 52 12/14/2023   LDLDIRECT 85.0 11/19/2022   LDLCALC 79 12/14/2023   ALT 21 12/14/2023   AST 20 12/14/2023   NA 137 12/14/2023   K 4.1 12/14/2023   CL 101 12/14/2023   CREATININE 0.84 12/14/2023   BUN 12 12/14/2023   CO2 28 12/14/2023   TSH 3.37 12/14/2023   INR 0.9 02/27/2022   HGBA1C 6.3 12/14/2023   MICROALBUR <0.7 12/14/2023    MM 3D SCREENING MAMMOGRAM BILATERAL BREAST Result Date: 05/21/2023 CLINICAL DATA:  Screening. EXAM: DIGITAL SCREENING BILATERAL MAMMOGRAM WITH TOMOSYNTHESIS AND CAD TECHNIQUE: Bilateral screening digital craniocaudal and mediolateral oblique mammograms were obtained. Bilateral screening digital breast tomosynthesis was performed. The images were evaluated with computer-aided detection. COMPARISON:  Previous exam(s). ACR Breast Density Category c: The breasts are heterogeneously dense, which may obscure small masses. FINDINGS: There are no findings suspicious for malignancy. IMPRESSION: No mammographic evidence of malignancy. A result letter of this screening mammogram will be mailed directly to the patient. RECOMMENDATION: Screening mammogram in one year. (Code:[id number]) BI-RADS CATEGORY  1: Negative. Electronically Signed   By: Bary Richard M.D.   On: 05/21/2023 14:06    Assessment & Plan:  .Controlled type 2 diabetes mellitus without complication, without long-term current use of insulin (HCC) Assessment & Plan: Currently well-controlled with  dietary modifications and restriction of simple sugars. Patient is reminded to schedule an annual eye exam  every spring  Patient has no microalbuminuria.  Patient is tolerating statin therapy for CAD risk reduction and on  ACE/ARB for renal protection and hypertension .  She is planning to resume  low impact aerobic exercising   . Lab Results  Component Value Date   HGBA1C 6.3 12/14/2023   Lab Results  Component Value Date   MICROALBUR <0.7 12/14/2023   MICROALBUR <0.2 05/30/2022        Upper airway cough syndrome Assessment & Plan: Treated by Dr Belia Heman in early Feb with prednisone and z pack for flare.    - - continue allegra during the day - continue flonase daily, azelastine bid, singulair qhs - continue chlorpheniramine 4 mg nightly - continue tessalon 200 mg tid prn - continue atrovent nasal spray tid prn   Screening for tuberculosis  Paroxysmal atrial fibrillation (HCC) Assessment & Plan: Continue metoprolol dose  25 mg daily   Meniere's disease, unspecified laterality Assessment & Plan: Episodes are managemend with  Valium 5 mg twice daily as needed for vertigo.  Advised to monitor for drowsiness.  Reminded to avoid  mixing Valium with the Xanax    Other orders -     Cyanocobalamin; Inject 1 mL (1,000 mcg total) into the muscle every 30 (thirty) days.  Dispense: 3 mL; Refill: 3 -     Montelukast Sodium; Take 1 tablet (10 mg total) by mouth at bedtime.  Dispense: 90 tablet; Refill: 1 -     Cetirizine HCl; Take 1 tablet (10 mg total) by mouth daily.  Dispense: 90 tablet; Refill: 3 -     cycloSPORINE; Place 1 drop into both eyes 2 (two) times daily.  Dispense: 3 each; Refill: 3 -     Syringe; 1 Syringe by Does not apply route every 30 (thirty) days. For use with b-12 injection monthly  Dispense: 50 each; Refill: 0 -     ALPRAZolam; TAKE 1 TABLET (0.25 MG TOTAL) BY MOUTH ONCE DAILY AS NEEDED FOR SLEEP.  Dispense: 30 tablet; Refill: 5     I spent 34 minutes on the day of this face to face encounter reviewing patient's  most recent visit with pulmonology, prior relevant surgical and non surgical procedures, recent  labs and imaging studies, counseling on diabetes andweight management,   reviewing the assessment and plan with patient, and post visit ordering and reviewing of  diagnostics and therapeutics with patient  .   Follow-up: No follow-ups on file.   Sherlene Shams, MD

## 2023-12-22 NOTE — Assessment & Plan Note (Addendum)
 Currently well-controlled with  dietary modifications and restriction of simple sugars. Patient is reminded to schedule an annual eye exam  every spring  Patient has no microalbuminuria. Patient is tolerating statin therapy for CAD risk reduction and on ACE/ARB for renal protection and hypertension .  She is planning to resume  low impact aerobic exercising   . Lab Results  Component Value Date   HGBA1C 6.3 12/14/2023   Lab Results  Component Value Date   MICROALBUR <0.7 12/14/2023   MICROALBUR <0.2 05/30/2022

## 2023-12-22 NOTE — Assessment & Plan Note (Signed)
 Episodes are managemend with  Valium 5 mg twice daily as needed for vertigo.  Advised to monitor for drowsiness.  Reminded to avoid  mixing Valium with the Xanax

## 2023-12-23 MED ORDER — ATORVASTATIN CALCIUM 40 MG PO TABS: 40.0000 mg | ORAL_TABLET | Freq: Every day | ORAL | 0 refills | Status: DC

## 2023-12-23 MED ORDER — METOPROLOL SUCCINATE ER 50 MG PO TB24: 50.0000 mg | ORAL_TABLET | Freq: Every day | ORAL | 0 refills | Status: DC

## 2023-12-23 NOTE — Addendum Note (Signed)
 Addended by: Auburn Bilberry D on: 12/23/2023 09:58 AM   Modules accepted: Orders

## 2023-12-24 ENCOUNTER — Ambulatory Visit: Payer: Medicare Other

## 2023-12-24 DIAGNOSIS — D2262 Melanocytic nevi of left upper limb, including shoulder: Secondary | ICD-10-CM | POA: Diagnosis not present

## 2023-12-24 DIAGNOSIS — L538 Other specified erythematous conditions: Secondary | ICD-10-CM | POA: Diagnosis not present

## 2023-12-24 DIAGNOSIS — L820 Inflamed seborrheic keratosis: Secondary | ICD-10-CM | POA: Diagnosis not present

## 2023-12-24 DIAGNOSIS — Z111 Encounter for screening for respiratory tuberculosis: Secondary | ICD-10-CM

## 2023-12-24 DIAGNOSIS — D2272 Melanocytic nevi of left lower limb, including hip: Secondary | ICD-10-CM | POA: Diagnosis not present

## 2023-12-24 DIAGNOSIS — D2261 Melanocytic nevi of right upper limb, including shoulder: Secondary | ICD-10-CM | POA: Diagnosis not present

## 2023-12-24 DIAGNOSIS — R202 Paresthesia of skin: Secondary | ICD-10-CM | POA: Diagnosis not present

## 2023-12-24 DIAGNOSIS — D225 Melanocytic nevi of trunk: Secondary | ICD-10-CM | POA: Diagnosis not present

## 2023-12-24 DIAGNOSIS — Z85828 Personal history of other malignant neoplasm of skin: Secondary | ICD-10-CM | POA: Diagnosis not present

## 2023-12-24 NOTE — Addendum Note (Signed)
 Addended by: Rita Ohara D on: 12/24/2023 03:53 PM   Modules accepted: Orders

## 2023-12-24 NOTE — Progress Notes (Signed)
 Pt presented today for a PPD read. TB skin test was negative and 0 induration.

## 2023-12-28 LAB — TB SKIN TEST
Induration: 0 mm
TB Skin Test: NEGATIVE

## 2023-12-29 ENCOUNTER — Encounter: Payer: Self-pay | Admitting: Internal Medicine

## 2024-01-12 DIAGNOSIS — I10 Essential (primary) hypertension: Secondary | ICD-10-CM

## 2024-01-12 MED ORDER — METOPROLOL SUCCINATE ER 50 MG PO TB24: 50.0000 mg | ORAL_TABLET | Freq: Every day | ORAL | 1 refills | Status: DC

## 2024-01-12 MED ORDER — LOSARTAN POTASSIUM 50 MG PO TABS: 50.0000 mg | ORAL_TABLET | Freq: Every day | ORAL | 1 refills | Status: DC

## 2024-01-12 MED ORDER — BLOOD GLUCOSE CALIBRATION HIGH VI LIQD: 0 refills | Status: AC

## 2024-01-14 DIAGNOSIS — M2550 Pain in unspecified joint: Secondary | ICD-10-CM | POA: Diagnosis not present

## 2024-01-14 DIAGNOSIS — M049 Autoinflammatory syndrome, unspecified: Secondary | ICD-10-CM | POA: Diagnosis not present

## 2024-01-14 DIAGNOSIS — H04123 Dry eye syndrome of bilateral lacrimal glands: Secondary | ICD-10-CM | POA: Diagnosis not present

## 2024-01-14 DIAGNOSIS — R682 Dry mouth, unspecified: Secondary | ICD-10-CM | POA: Diagnosis not present

## 2024-01-14 DIAGNOSIS — R0602 Shortness of breath: Secondary | ICD-10-CM | POA: Diagnosis not present

## 2024-02-05 ENCOUNTER — Other Ambulatory Visit: Payer: Self-pay | Admitting: Physician Assistant

## 2024-02-05 DIAGNOSIS — I10 Essential (primary) hypertension: Secondary | ICD-10-CM

## 2024-02-09 ENCOUNTER — Ambulatory Visit

## 2024-02-09 ENCOUNTER — Encounter: Payer: Self-pay | Admitting: Physician Assistant

## 2024-02-09 ENCOUNTER — Ambulatory Visit: Payer: Medicare Other | Attending: Physician Assistant | Admitting: Physician Assistant

## 2024-02-09 VITALS — BP 146/76 | HR 76 | Ht 64.5 in | Wt 171.0 lb

## 2024-02-09 DIAGNOSIS — I480 Paroxysmal atrial fibrillation: Secondary | ICD-10-CM

## 2024-02-09 DIAGNOSIS — G4733 Obstructive sleep apnea (adult) (pediatric): Secondary | ICD-10-CM | POA: Diagnosis not present

## 2024-02-09 DIAGNOSIS — I5189 Other ill-defined heart diseases: Secondary | ICD-10-CM | POA: Insufficient documentation

## 2024-02-09 DIAGNOSIS — E785 Hyperlipidemia, unspecified: Secondary | ICD-10-CM | POA: Insufficient documentation

## 2024-02-09 DIAGNOSIS — I10 Essential (primary) hypertension: Secondary | ICD-10-CM | POA: Insufficient documentation

## 2024-02-09 DIAGNOSIS — I700 Atherosclerosis of aorta: Secondary | ICD-10-CM | POA: Diagnosis not present

## 2024-02-09 DIAGNOSIS — R002 Palpitations: Secondary | ICD-10-CM | POA: Diagnosis not present

## 2024-02-09 NOTE — Progress Notes (Signed)
 Cardiology Office Note    Date:  02/09/2024   ID:  Angela Mendoza, DOB Jun 06, 1951, MRN [id number]  PCP:  Sherlene Shams, MD  Cardiologist:  Julien Nordmann, MD  Electrophysiologist:  None   Chief Complaint: Follow up  History of Present Illness:   Angela Mendoza is a 73 y.o. female with history of PAF, GR1DD, HTN, OSA on CPAP, asthma, and anxiety who presents for follow-up of PAF.   Prior note indicates reported A-fib in 2016 with details being unclear.  Has not been maintained on anticoagulation by primary cardiologist given the lack of documented recurrence.   Calcium score in 06/2018 of 3 which was the 49th percentile.   Echo from 12/2020 demonstrated an EF of 60 to 65%, no regional wall motion abnormalities, grade 2 diastolic dysfunction, normal RV systolic function and ventricular cavity size, mildly elevated PASP estimated at 39.1 mmHg, and mild mitral regurgitation.   Zio patch from 12/2020 showed a predominant rhythm of sinus with an average rate of 71 bpm (range 50 to 127 bpm), rare atrial and ventricular ectopy, and no significant arrhythmia.   Coronary CTA in 03/2021 showed a calcium score of 0 with no evidence of CAD.  Noncardiac over read showed aortic atherosclerosis.   She was seen in the ED and 01/2023 with URI.  Troponin negative.  Chest x-ray without acute cardiopulmonary process.  Treated for asthma exacerbation in April.   CT abdomen pelvis in 03/2023, showed colonic diverticulosis without evidence of diverticulitis or other acute findings along with aortic atherosclerosis.   She was seen in the office in 04/2023 noting a 68-month history of progressive and intermittent lower extremity swelling along the ankles with the left being worse than the right with prior trauma to the left ankle following a mechanical fall.  She reported some abdominal bloating, swelling along her hands, and diffuse arthralgias.  She indicated she felt inflamed all over.  Her weight was down 14  pounds by our scale when compared to her visit in 04/2022.  She was under increased stress.  Labs obtained at that time were unrevealing including WESR and CRP.  Echo on 05/26/2023 showed an EF of 55 to 60%, no regional wall motion abnormalities, grade 1 diastolic dysfunction, normal RV systolic function and ventricular cavity size, mild mitral regurgitation, and an estimated right atrial pressure of 3 mmHg.  ABIs on 06/30/2023 were normal bilaterally.  She was last seen in the office in 06/2023 and was doing well from a cardiac perspective.  Her weight was down another 4 pounds with lifestyle modification.  No changes were indicated at that time.  She comes in today noting several concerns.  She developed a cough in early 2025 that was initially felt to be related to URI, though cough has persisted.  She has undergone evaluation through rheumatology and has an appointment with pulmonology tomorrow.  She does wonder if this cough is related to changing her inhaler.  There is also been some discussion of possible underlying rheumatological process.  Chest x-ray last month was unrevealing.  She also notes some intermittent palpitations with heart rate during these episodes ranging from the 70s to 80s.  Palpitations are described as a heart pounding sensation.  No frank angina or symptoms of cardiac decompensation.  She would like to move forward with calcium score.  She also reports an episode of blurry vision along the bilateral eyes back in December that was short-lived and spontaneously resolved.  She has not had  similar episodes since.  Otherwise, no focal deficits.   Labs independently reviewed: 12/2023 - Hgb 12.6, PLT 289 11/2023 - potassium 4.1, BUN 12, serum creatinine 0.84, albumin 4.5, AST/ALT normal, TC 159, TG 185, HDL 52, LDL 79, TSH normal, A1c 6.3  Past Medical History:  Diagnosis Date   Agatston coronary artery calcium score less than 100    a. 06/2018 Cardiac CT: Ca2+ = 3 (49th %'ile).    Allergic rhinitis 06/09/2008   chronic   Allergy    Aortic atherosclerosis (HCC)    a. 06/2018 noted on chest CT.   Arthritis    Asthma, intrinsic 10/06/2011   controlled on qvar. Cleda Daub 09/2011:  Very mild airflow obstruction (FEV1% 68, FVL c/w obstruction)    Basal cell carcinoma 06/10/2021   right prox nasal ala rim - MOHs 07/12/21 Dr. Adriana Simas   Cataract    Chronic headache 06/09/2008   COPD (chronic obstructive pulmonary disease) (HCC)    Depression    Diverticulosis 2013   h/o diverticulitis   Dry eyes    GERD (gastroesophageal reflux disease)    Heart murmur    History of anal fissures    x2   History of breast implant removal    silicone mastitis - R axilla silicone LN, free silicone L breast upper outer quadrant   Hot flashes    HYPERLIPIDEMIA 06/09/2008   HYPERTENSION 06/09/2008   Hypothyroidism    MVP (mitral valve prolapse)    OSA on CPAP    [name]   PAF (paroxysmal atrial fibrillation) (HCC) 06/09/2008   a. CHA2DS2VASc = 3-->prefers ASA.   Pernicious anemia    per prior pcp   Prediabetes    6.9 previous A1c   Rosacea    Sleep apnea    Surgical menopause 1991   Vertigo    Vitamin D deficiency     Past Surgical History:  Procedure Laterality Date   APPENDECTOMY  2007   bladder tack     bladder tack  08/2022   additional 12/24   BREAST BIOPSY Left 09/27/2009   BREAST ENHANCEMENT SURGERY  2006   and removal   CARDIOVASCULAR STRESS TEST  2013   [place] Dr. Dulce Sellar - WNL, EF 55-60%, with 0 calcium score   CESAREAN SECTION     CHOLECYSTECTOMY  2007   biliary dyskinesia   COLONOSCOPY  08/2012   diverticulosis   COLONOSCOPY  2006   inflamed hyperplastic polyp   ESOPHAGOGASTRODUODENOSCOPY  08/2012   esophageal reflux   ESOPHAGOGASTRODUODENOSCOPY  11/02/2008   Dr Chales Abrahams Minimal gastritis. Irregular Z line suggestive of gastroesophageal reflux (biopsied to rule out short-segment Barrett's esophagus) Incidental gastric polyps   HERNIA REPAIR     NASAL SINUS  SURGERY     RECTOCELE REPAIR  12/2008   Dr. Thamas Jaegers   sleep study  08/2007   OSA, 12 cm H2O,   TOTAL ABDOMINAL HYSTERECTOMY  1991   heavy bleeding, ovaries removed   TRIGGER FINGER RELEASE Right 06/18/2023   TUBAL LIGATION     tummy tuck     US ECHOCARDIOGRAPHY  09/2009   impaired relaxation, mild tric regurg, normal MV, EF 60%   US ECHOCARDIOGRAPHY  11/2007   mild-mod MR    Current Medications: Current Meds  Medication Sig   albuterol (PROVENTIL HFA) 108 (90 Base) MCG/ACT inhaler Inhale 2 puffs into the lungs every 6 (six) hours as needed.   ALPRAZolam (XANAX) 0.25 MG tablet TAKE 1 TABLET (0.25 MG TOTAL) BY MOUTH ONCE DAILY AS  NEEDED FOR SLEEP.   atorvastatin (LIPITOR) 40 MG tablet Take 1 tablet (40 mg total) by mouth daily.   azelastine (ASTELIN) 0.1 % nasal spray Place 2 sprays into both nostrils 2 (two) times daily. Use in each nostril as directed   benzonatate (TESSALON) 200 MG capsule Take 1 capsule (200 mg total) by mouth 3 (three) times daily as needed for cough.   Blood Glucose Calibration High LIQD Used to check accuracy of blood glucose machine.   cetirizine (ZYRTEC) 10 MG tablet Take 1 tablet (10 mg total) by mouth daily.   chlorpheniramine (CHLOR-TRIMETON) 4 MG tablet Take 1 tablet (4 mg total) by mouth at bedtime as needed for allergies or rhinitis.   Cholecalciferol (VITAMIN D3) 50 MCG (2000 UT) TABS Take 1 tablet by mouth daily.   ciclopirox (PENLAC) 8 % solution Apply topically at bedtime. Apply over nail and surrounding skin. Apply daily over previous coat. After seven (7) days, may remove with alcohol and continue cycle.   Coenzyme Q10 (COQ10 PO) Take by mouth daily.   conjugated estrogens (PREMARIN) vaginal cream Place vaginally 2 (two) times a week. INSERT 0.5 APPLICATORS FULL VAGINALLY TWICE PER WEEK. AT BEDTIME.   cyanocobalamin (VITAMIN B12) 1000 MCG/ML injection Inject 1 mL (1,000 mcg total) into the muscle every 30 (thirty) days.   cycloSPORINE (RESTASIS) 0.05  % ophthalmic emulsion Place 1 drop into both eyes 2 (two) times daily.   desonide (DESOWEN) 0.05 % cream Apply topically as needed.   diazepam (VALIUM) 5 MG tablet Take 1 tablet (5 mg total) by mouth every 12 (twelve) hours as needed (Vertigo).   Docusate Calcium (STOOL SOFTENER PO) Take 1 tablet by mouth as needed.   EPINEPHrine 0.3 mg/0.3 mL IJ SOAJ injection 0.3 mg by injection route.   estradiol (VIVELLE-DOT) 0.1 MG/24HR patch Place 1 patch (0.1 mg total) onto the skin 2 (two) times a week.   famotidine (PEPCID) 20 MG tablet Take 1 tablet (20 mg total) by mouth daily as needed for heartburn or indigestion.   fluticasone (FLONASE) 50 MCG/ACT nasal spray Place 1 spray into both nostrils daily.   glucose blood (ONETOUCH VERIO) test strip Used to check blood sugars 3 times daily.   hydrocortisone (ANUSOL-HC) 2.5 % rectal cream Place 1 Application rectally 2 (two) times daily. For 10 days and then as needed   ipratropium (ATROVENT) 0.03 % nasal spray Place 2 sprays into both nostrils 3 (three) times daily as needed for rhinitis.   levothyroxine (SYNTHROID) 50 MCG tablet Take 1 tablet (50 mcg total) by mouth daily before breakfast.   losartan (COZAAR) 50 MG tablet Take 1 tablet (50 mg total) by mouth daily.   loteprednol (LOTEMAX) 0.2 % SUSP Place 1 drop into both eyes 2 (two) times daily.   MAGNESIUM PO Take 250 mg by mouth daily.   meclizine (ANTIVERT) 25 MG tablet Take 1 tablet (25 mg total) by mouth 3 (three) times daily as needed (vertigo).   metoprolol succinate (TOPROL-XL) 50 MG 24 hr tablet TAKE 1 TABLET BY MOUTH EVERY DAY   Miconazole Nitrate (LOTRIMIN AF) 2 % AERO Apply topically.   montelukast (SINGULAIR) 10 MG tablet Take 1 tablet (10 mg total) by mouth at bedtime.   Multiple Vitamin (MULTIVITAMIN) tablet Take 1 tablet by mouth daily.   mupirocin ointment (BACTROBAN) 2 % Apply topically as needed.   NONFORMULARY OR COMPOUNDED ITEM Spironolactone (PF) 0.005 mg opth. Both eyes 4 times  daily   omega-3 acid ethyl esters (LOVAZA) 1 g  capsule Take 1 capsule (1 g total) by mouth 2 (two) times daily.   ondansetron (ZOFRAN ODT) 4 MG disintegrating tablet Take 1 tablet (4 mg total) by mouth every 8 (eight) hours as needed for nausea or vomiting.   OneTouch Delica Lancets 33G MISC Used to check blood sugars twice daly.   Polyethyl Glycol-Propyl Glycol 0.4-0.3 % SOLN Apply to eye.   Probiotic Product (PROBIOTIC DAILY PO) Take by mouth.   Propylene Glycol 0.6 % SOLN Apply 1 drop to eye as needed.   Spacer/Aero-Holding Chambers (AEROCHAMBER MV) inhaler Use as instructed   Syringe/Needle, Disp, (SYRINGE 3CC/20GX1") 20G X 1" 3 ML MISC 1 Syringe by Does not apply route every 30 (thirty) days. For use with b-12 injection monthly   Tiotropium Bromide Monohydrate (SPIRIVA RESPIMAT) 1.25 MCG/ACT AERS Inhale 2 puffs into the lungs daily.   tretinoin (RETIN-A) 0.05 % cream Apply topically at bedtime.   urea (CARMOL) 40 % CREA Apply topically.   Wheat Dextrin (BENEFIBER DRINK MIX PO) Take by mouth. Use as directed    Allergies:   Codeine, Olodaterol, Qvar [beclomethasone], and Sulfonamide derivatives   Social History   Socioeconomic History   Marital status: Married    Spouse name: Not on file   Number of children: Not on file   Years of education: Not on file   Highest education level: Associate degree: occupational, Scientist, product/process development, or vocational program  Occupational History   Occupation: retired  Tobacco Use   Smoking status: Former    Current packs/day: 0.00    Average packs/day: 0.3 packs/day for 7.0 years (2.1 ttl pk-yrs)    Types: Cigarettes    Start date: 10/27/1970    Quit date: 10/27/1977    Years since quitting: 46.3    Passive exposure: Past   Smokeless tobacco: Never   Tobacco comments:    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 Hx of children who are grown  Vaping Use   Vaping status: Never Used  Substance and Sexual Activity   Alcohol use: Never   Drug  use: No   Sexual activity: Not on file  Other Topics Concern   Not on file  Social History Narrative   Lives with husband    Grown children   Occ: retired, was Research scientist (medical)   Edu: trade degree then 2 yrscollege   Social Drivers of Corporate investment banker Strain: Low Risk  (01/14/2024)   Received from [facility]   Overall Financial Resource Strain (CARDIA)    Difficulty of Paying Living Expenses: Not very hard  Food Insecurity: No Food Insecurity (01/14/2024)   Received from [facility]   Hunger Vital Sign    Worried About Running Out of Food in the Last Year: Never true    Ran Out of Food in the Last Year: Never true  Transportation Needs: No Transportation Needs (01/14/2024)   Received from [facility] - [facility]    In the past 12 months, has lack of transportation kept you from medical appointments or from getting medications?: No    Lack of Transportation (Non-Medical): No  Physical Activity: Insufficiently Active (12/20/2023)   Exercise Vital Sign    Days of Exercise per Week: 4 days    Minutes of Exercise per Session: 20 min  Stress: Stress Concern Present (12/20/2023)   [employer] of Occupational Health - Occupational Stress Questionnaire    Feeling of Stress : To some extent  Social  Connections: Unknown (12/20/2023)   Social Connection and Isolation Panel [NHANES]    Frequency of Communication with Friends and Family: More than three times a week    Frequency of Social Gatherings with Friends and Family: Patient declined    Attends Religious Services: Patient declined    Database administrator or Organizations: Patient declined    Attends Banker Meetings: Never    Marital Status: Married     Family History:  The patient's family history includes AAA (abdominal aortic aneurysm) in her paternal uncle; Aortic dissection in her maternal aunt and paternal uncle; Breast cancer in  her maternal aunt; COPD in her sister; Cancer in her cousin and paternal aunt; Cancer (age of onset: 9) in her father; Cancer (age of onset: 62) in her mother; Colon polyps in her sister; Heart attack in her paternal uncle; Heart disease in her paternal uncle; Heart failure in her sister; Hypertension in her sister; Stroke in her paternal uncle and sister. There is no history of Colon cancer, Rectal cancer, Stomach cancer, or Esophageal cancer.  ROS:   12-point review of systems is negative unless otherwise noted in the HPI.   EKGs/Labs/Other Studies Reviewed:    Studies reviewed were summarized above. The additional studies were reviewed today:  ABI 06/30/2023: ABI/TBIToday's ABIToday's TBIPrevious ABIPrevious TBI  +-------+-----------+-----------+------------+------------+  Right 1.22       1.28                                 +-------+-----------+-----------+------------+------------+  Left  1.25       0.89                                 +-------+-----------+-----------+------------+------------+   Summary:  Right: Resting right ankle-brachial index is within normal range. The  right toe-brachial index is normal.   Left: Resting left ankle-brachial index is within normal range. The left  toe-brachial index is normal.  __________   2D echo 05/26/2023: 1. Left ventricular ejection fraction, by estimation, is 55 to 60%. Left  ventricular ejection fraction by 3D volume is 61 %. The left ventricle has  normal function. The left ventricle has no regional wall motion  abnormalities. Left ventricular diastolic   parameters are consistent with Grade I diastolic dysfunction (impaired  relaxation). The average left ventricular global longitudinal strain is  -17.0 %.   2. Right ventricular systolic function is normal. The right ventricular  size is normal. Tricuspid regurgitation signal is inadequate for assessing  PA pressure.   3. The mitral valve is normal in  structure. Mild mitral valve  regurgitation. No evidence of mitral stenosis.   4. The aortic valve is normal in structure. Aortic valve regurgitation is  trivial. No aortic stenosis is present.   5. The inferior vena cava is normal in size with greater than 50%  respiratory variability, suggesting right atrial pressure of 3 mmHg.  __________   Coronary CTA 04/04/2021: FINDINGS: Aorta: Normal size. Mild root and descending aortic wall calcifications. No dissection.   Aortic Valve:  Trileaflet.  No calcifications.   Coronary Arteries:  Normal coronary origin.  Right dominance.   RCA is a dominant artery that gives rise to PDA and PLA. There is no plaque.   Left main is a large artery that gives rise to LAD and LCX arteries.   LAD has no plaque.  LCX is a non-dominant artery that gives rise to two obtuse marginal branches. There is no plaque.   Other findings:   Normal pulmonary vein drainage into the left atrium.   Normal left atrial appendage without a thrombus.   Normal size of the pulmonary artery.   IMPRESSION: 1. Coronary calcium score of 0. Patient is low risk for coronary events. 2. Normal coronary origin with right dominance. 3. No evidence of CAD. 4. CAD-RADS 0. Consider non-atherosclerotic causes of chest pain. __________   [place] patch 12/2020: Predominant underlying rhythm was Sinus Rhythm. Patient had a min HR of 50 bpm, max HR of 127 bpm, and avg HR of 71 bpm.    Isolated SVEs  were rare (<1.0%), SVE Couplets were rare (<1.0%), and no SVE Triplets were present. Isolated VEs were rare (<1.0%), VE Couplets were rare (<1.0%), and no VE Triplets were present.    Most patient triggered events (107)  associated with normal sinus, no arrhythmia. __________   2D echo 01/23/2021: 1. Left ventricular ejection fraction, by estimation, is 60 to 65%. The  left ventricle has normal function. The left ventricle has no regional  wall motion abnormalities. Left  ventricular diastolic parameters are  consistent with Grade II diastolic  dysfunction (pseudonormalization).   2. Right ventricular systolic function is normal. The right ventricular  size is normal. There is mildly elevated pulmonary artery systolic  pressure. The estimated right ventricular systolic pressure is 39.1 mmHg.   3. The mitral valve is normal in structure. Mild mitral valve  regurgitation.  ___________   Calcium score 07/12/2018: IMPRESSION: Coronary calcium score of 3. This was 10 th percentile for age and sex matched control. __________   See Epic for more remote studies   EKG:  EKG is not ordered today.    Recent Labs: 05/04/2023: Hemoglobin 13.0; Platelets 261 12/14/2023: ALT 21; BUN 12; Creatinine, Ser 0.84; Potassium 4.1; Sodium 137; TSH 3.37  Recent Lipid Panel    Component Value Date/Time   CHOL 159 12/14/2023 0902   TRIG 185 (H) 12/14/2023 0902   TRIG 199 04/12/2013 0000   HDL 52 12/14/2023 0902   CHOLHDL 3.1 12/14/2023 0902   VLDL 60 (H) 05/04/2023 1026   LDLCALC 79 12/14/2023 0902   LDLCALC 93 04/12/2013 0000   LDLDIRECT 85.0 11/19/2022 1448    PHYSICAL EXAM:    VS:  BP (!) 146/76   Pulse 76   Ht 5' 4.5" (1.638 m)   Wt 171 lb (77.6 kg)   SpO2 97%   BMI 28.90 kg/m   BMI: Body mass index is 28.9 kg/m.  Physical Exam Vitals reviewed.  Constitutional:      Appearance: She is well-developed.  HENT:     Head: Normocephalic and atraumatic.  Eyes:     General:        Right eye: No discharge.        Left eye: No discharge.  Cardiovascular:     Rate and Rhythm: Normal rate and regular rhythm.     Heart sounds: Normal heart sounds, S1 normal and S2 normal. Heart sounds not distant. No midsystolic click and no opening snap. No murmur heard.    No friction rub.  Pulmonary:     Effort: Pulmonary effort is normal. No respiratory distress.     Breath sounds: Normal breath sounds. No decreased breath sounds, wheezing, rhonchi or rales.  Chest:      Chest wall: No tenderness.  Musculoskeletal:     Cervical back: Normal  range of motion.  Skin:    General: Skin is warm and dry.     Nails: There is no clubbing.  Neurological:     Mental Status: She is alert and oriented to person, place, and time.  Psychiatric:        Speech: Speech normal.        Behavior: Behavior normal.        Thought Content: Thought content normal.        Judgment: Judgment normal.     Wt Readings from Last 3 Encounters:  02/09/24 171 lb (77.6 kg)  12/22/23 173 lb 12.8 oz (78.8 kg)  11/30/23 175 lb (79.4 kg)     ASSESSMENT & PLAN:   PAF/palpitations: Maintaining sinus rhythm.  Prior documentation indicates an episode in 2016 with further details unavailable for review.  Primary cardiologist has historically not maintained the patient on anticoagulation.  She remains on Toprol-XL.  With palpitations and episode of vision disturbance placed ZIO XT.  Diastolic dysfunction: Euvolemic and not requiring standing loop diuretic.  Continue optimal blood pressure control, heart healthy diet, and weight management.  HTN: Blood pressure is mildly elevated in the office today, though typically well-controlled.  She remains on Toprol-XL and losartan.  Aortic atherosclerosis/HLD: LDL 79 in 11/2023.  Obtain coronary artery calcium score for further risk stratification.  Update fasting lipid panel and LFT.  She remains on atorvastatin 40 mg.  OSA: CPAP.    Disposition: F/u with Dr. Gollan or an APP in 2 months.   Medication Adjustments/Labs and Tests Ordered: Current medicines are reviewed at length with the patient today.  Concerns regarding medicines are outlined above. Medication changes, Labs and Tests ordered today are summarized above and listed in the Patient Instructions accessible in Encounters.   Signed, Varney Gentleman, PA-C 02/09/2024 5:13 PM     [facility] [place] [address] [place], [place] [address] [phone]

## 2024-02-09 NOTE — Patient Instructions (Addendum)
 Medication Instructions:  Your Physician recommend you continue on your current medication as directed.    *If you need a refill on your cardiac medications before your next appointment, please call your pharmacy*  Lab Work: Your provider would like for you to return in 1 week to have the following labs drawn: fasting lipid test, liver function test.   Please go to [facility] [address] ([facility]) [address], [place] [address] You do not need an appointment.  They are open from 8 am- 4:30 pm.  Lunch from 1:00 pm- 2:00 pm You DO need to be fasting.  If you have labs (blood work) drawn today and your tests are completely normal, you will receive your results only by: MyChart Message (if you have MyChart) OR A paper copy in the mail If you have any lab test that is abnormal or we need to change your treatment, we will call you to review the results.  Testing/Procedures: Your physician has recommended that you wear a Zio monitor.   This monitor is a medical device that records the heart's electrical activity. Doctors most often use these monitors to diagnose arrhythmias. Arrhythmias are problems with the speed or rhythm of the heartbeat. The monitor is a small device applied to your chest. You can wear one while you do your normal daily activities. While wearing this monitor if you have any symptoms to push the button and record what you felt. Once you have worn this monitor for the period of time provider prescribed (Usually 14 days), you will return the monitor device in the postage paid box. Once it is returned they will download the data collected and provide [place] with a report which the provider will then review and we will call you with those results. Important tips:  Avoid showering during the first 24 hours of wearing the monitor. Avoid excessive sweating to help maximize wear time. Do not submerge the device, no hot tubs, and no swimming pools. Keep any lotions  or oils away from the patch. After 24 hours you may shower with the patch on. Take brief showers with your back facing the shower head.  Do not remove patch once it has been placed because that will interrupt data and decrease adhesive wear time. Push the button when you have any symptoms and write down what you were feeling. Once you have completed wearing your monitor, remove and place into box which has postage paid and place in your outgoing mailbox.  If for some reason you have misplaced your box then call our office and we can provide another box and/or mail it off for you.    Coronary Calcium Score   - $99 out of pocket cost at the time of your test - Call [phone] to schedule at your convenience.  [facility] [address] [place], [place] [address]   Coronary CalciumScan A coronary calcium scan is an imaging test used to look for deposits of calcium and other fatty materials (plaques) in the inner lining of the blood vessels of the heart (coronary arteries). These deposits of calcium and plaques can partly clog and narrow the coronary arteries without producing any symptoms or warning signs. This puts a person at risk for a heart attack. This test can detect these deposits before symptoms develop. Tell a health care provider about: Any allergies you have. All medicines you are taking, including vitamins, herbs, eye drops, creams, and over-the-counter medicines. Any problems you  or family members have had with anesthetic medicines. Any blood disorders you have. Any surgeries you have had. Any medical conditions you have. Whether you are pregnant or may be pregnant. What are the risks? Generally, this is a safe procedure. However, problems may occur, including: Harm to a pregnant woman and her unborn baby. This test involves the use of radiation. Radiation exposure can be dangerous to a pregnant woman and her unborn baby. If you are  pregnant, you generally should not have this procedure done. Slight increase in the risk of cancer. This is because of the radiation involved in the test. What happens before the procedure? No preparation is needed for this procedure. What happens during the procedure? You will undress and remove any jewelry around your neck or chest. You will put on a hospital gown. Sticky electrodes will be placed on your chest. The electrodes will be connected to an electrocardiogram (ECG) machine to record a tracing of the electrical activity of your heart. A CT scanner will take pictures of your heart. During this time, you will be asked to lie still and hold your breath for 2-3 seconds while a picture of your heart is being taken. The procedure may vary among health care providers and hospitals. What happens after the procedure? You can get dressed. You can return to your normal activities. It is up to you to get the results of your test. Ask your health care provider, or the department that is doing the test, when your results will be ready. Summary A coronary calcium scan is an imaging test used to look for deposits of calcium and other fatty materials (plaques) in the inner lining of the blood vessels of the heart (coronary arteries). Generally, this is a safe procedure. Tell your health care provider if you are pregnant or may be pregnant. No preparation is needed for this procedure. A CT scanner will take pictures of your heart. You can return to your normal activities after the scan is done.   Follow-Up: At [facility], you and your health needs are our priority.  As part of our continuing mission to provide you with exceptional heart care, our providers are all part of one team.  This team includes your primary Cardiologist (physician) and Advanced Practice Providers or APPs (Physician Assistants and Nurse Practitioners) who all work together to provide you with the care you need, when  you need it.  Your next appointment:   2 month(s)  Provider:   You may see Timothy Gollan, MD or one of the following Advanced Practice Providers on your designated Care Team:   Laneta Pintos, NP Gildardo Labrador, PA-C Varney Gentleman, PA-C Cadence [place], PA-C Ronald Cockayne, NP Morey Ar, NP    We recommend signing up for the patient portal called "MyChart".  Sign up information is provided on this After Visit Summary.  MyChart is used to connect with patients for Virtual Visits (Telemedicine).  Patients are able to view lab/test results, encounter notes, upcoming appointments, etc.  Non-urgent messages can be sent to your provider as well.   To learn more about what you can do with MyChart, go to [email or web address].

## 2024-02-10 DIAGNOSIS — Z7952 Long term (current) use of systemic steroids: Secondary | ICD-10-CM | POA: Diagnosis not present

## 2024-02-10 DIAGNOSIS — R053 Chronic cough: Secondary | ICD-10-CM | POA: Diagnosis not present

## 2024-02-10 DIAGNOSIS — L309 Dermatitis, unspecified: Secondary | ICD-10-CM | POA: Diagnosis not present

## 2024-02-10 DIAGNOSIS — J4540 Moderate persistent asthma, uncomplicated: Secondary | ICD-10-CM | POA: Diagnosis not present

## 2024-02-10 DIAGNOSIS — G4733 Obstructive sleep apnea (adult) (pediatric): Secondary | ICD-10-CM | POA: Diagnosis not present

## 2024-02-18 ENCOUNTER — Ambulatory Visit
Admission: RE | Admit: 2024-02-18 | Discharge: 2024-02-18 | Disposition: A | Discharge status: Home / Self Care | Payer: Self-pay | Source: Ambulatory Visit | Attending: Physician Assistant | Admitting: Physician Assistant

## 2024-02-18 DIAGNOSIS — E785 Hyperlipidemia, unspecified: Secondary | ICD-10-CM | POA: Insufficient documentation

## 2024-02-22 ENCOUNTER — Telehealth: Payer: Self-pay

## 2024-02-22 NOTE — Telephone Encounter (Signed)
 Attempted to call pt back at number provided and verified it with number in chart and is states that call can not be completed.

## 2024-02-22 NOTE — Telephone Encounter (Signed)
 Copied from CRM [id number]. Topic: Clinical - Medication Question >> Feb 22, 2024  2:40 PM Earnestine Goes B wrote: Reason for CRM: pt called requesting a prescription for tetirizineHyrdocloride 10mg . Pt would like the prescription for a 90 day supply sent to [facility]. Meds by mail. Please call pt back at [phone]

## 2024-02-23 ENCOUNTER — Other Ambulatory Visit: Payer: Self-pay | Admitting: Emergency Medicine

## 2024-02-23 ENCOUNTER — Encounter: Payer: Self-pay | Admitting: Emergency Medicine

## 2024-02-23 DIAGNOSIS — E785 Hyperlipidemia, unspecified: Secondary | ICD-10-CM

## 2024-02-24 LAB — HEPATIC FUNCTION PANEL
ALT: 20 IU/L (ref 0–32)
AST: 20 IU/L (ref 0–40)
Albumin: 4.5 g/dL (ref 3.8–4.8)
Alkaline Phosphatase: 73 IU/L (ref 44–121)
Bilirubin Total: 1.1 mg/dL (ref 0.0–1.2)
Bilirubin, Direct: 0.3 mg/dL (ref 0.00–0.40)
Total Protein: 6.6 g/dL (ref 6.0–8.5)

## 2024-02-24 LAB — LIPID PANEL
Chol/HDL Ratio: 3.5 ratio (ref 0.0–4.4)
Cholesterol, Total: 163 mg/dL (ref 100–199)
HDL: 47 mg/dL (ref >39)
LDL Chol Calc (NIH): 93 mg/dL (ref 0–99)
Triglycerides: 130 mg/dL (ref 0–149)
VLDL Cholesterol Cal: 23 mg/dL (ref 5–40)

## 2024-02-26 NOTE — Telephone Encounter (Signed)
 Attempted to call pt again and recording stated that call cannot be completed at this time.

## 2024-02-29 ENCOUNTER — Encounter: Payer: Self-pay | Admitting: Internal Medicine

## 2024-02-29 ENCOUNTER — Ambulatory Visit: Admitting: Internal Medicine

## 2024-02-29 ENCOUNTER — Ambulatory Visit: Payer: Medicare Other | Admitting: Internal Medicine

## 2024-02-29 VITALS — BP 116/70 | HR 70 | Temp 97.6°F | Ht 64.5 in | Wt 175.0 lb

## 2024-02-29 DIAGNOSIS — G4733 Obstructive sleep apnea (adult) (pediatric): Secondary | ICD-10-CM

## 2024-02-29 DIAGNOSIS — M154 Erosive (osteo)arthritis: Secondary | ICD-10-CM | POA: Diagnosis not present

## 2024-02-29 DIAGNOSIS — J4520 Mild intermittent asthma, uncomplicated: Secondary | ICD-10-CM | POA: Diagnosis not present

## 2024-02-29 DIAGNOSIS — H04123 Dry eye syndrome of bilateral lacrimal glands: Secondary | ICD-10-CM | POA: Diagnosis not present

## 2024-02-29 DIAGNOSIS — R682 Dry mouth, unspecified: Secondary | ICD-10-CM | POA: Diagnosis not present

## 2024-02-29 DIAGNOSIS — J4540 Moderate persistent asthma, uncomplicated: Secondary | ICD-10-CM

## 2024-02-29 LAB — NITRIC OXIDE: Nitric Oxide: 62

## 2024-02-29 MED ORDER — TRELEGY ELLIPTA 100-62.5-25 MCG/ACT IN AEPB: 1.0000 | INHALATION_SPRAY | Freq: Every day | RESPIRATORY_TRACT | Status: DC

## 2024-02-29 MED ORDER — TRELEGY ELLIPTA 100-62.5-25 MCG/ACT IN AEPB: 1.0000 | INHALATION_SPRAY | Freq: Every day | RESPIRATORY_TRACT | 10 refills | Status: DC

## 2024-02-29 NOTE — Patient Instructions (Addendum)
 Recommend TRELEGY one puff once per day Please rinse mouth and gargle after every use  Flonase  2 sprays each nostril once pre day Atrovent  nasal spray/Astelin  spray needed Continue Zyrtec   Avoid Allergens and Irritants Avoid secondhand smoke Avoid SICK contacts Recommend  Masking  when appropriate Recommend Keep up-to-date with vaccinations  Excellent Job A+ GOLD STAR!!  Continue CPAP as prescribed  Patient Instructions Continue to use CPAP every night, minimum of 4-6 hours a night.  Change equipment every 30 days or as directed by DME.  Wash your tubing with warm soap and water daily, hang to dry. Wash humidifier portion weekly. Use bottled, distilled water and change daily   Be aware of reduced alertness and do not drive or operate heavy machinery if experiencing this or drowsiness.  Exercise encouraged, as tolerated. Encouraged proper weight management.  Important to get eight or more hours of sleep  Limiting the use of the computer and television before bedtime.  Decrease naps during the day, so night time sleep will become enhanced.  Limit caffeine, and sleep deprivation.

## 2024-02-29 NOTE — Telephone Encounter (Signed)
 Copied from CRM [id number]. Topic: Clinical - Lab/Test Results >> Feb 29, 2024 12:05 PM Palma Bob wrote: Reason for CRM: Patient states she got a missed call from Dr office according to notes the patient wants to speak to someone in regards of the medication recommendation change on atorvastatin . Patient states she has an appt at 1:30 til 3 with another Dr. She asked if someone can call her after 3pm or before 1:30pm. She mention she can check her mychart after appt as well.

## 2024-02-29 NOTE — Progress Notes (Signed)
 [facility] Care, [facility] Sleep [facility]   Brief Summary:  73 year old female with asthma and OSA    Past Medical History:  Vit D deficiency, Vertigo, Rosacea, Hypothyroidism, HTN, HLD, GERD, Diverticulosis, Depression, HA, COVID 12 February 2021  Past Surgical History:  She  has a past surgical history that includes Cholecystectomy (2007); Appendectomy (2007); Tubal ligation; Total abdominal hysterectomy (1991); Nasal sinus surgery; Breast enhancement surgery (2006); Cardiovascular stress test (2013); Colonoscopy (08/2012); Esophagogastroduodenoscopy (08/2012); US  ECHOCARDIOGRAPHY (09/2009); US  ECHOCARDIOGRAPHY (11/2007); sleep study (08/2007); Rectocele repair (12/2008); Colonoscopy (2006); Breast biopsy (Left, 09/27/2009); Hernia repair; bladder tack; tummy tuck; Esophagogastroduodenoscopy (11/02/2008); Cesarean section; bladder tack (08/2022); and Trigger finger release (Right, 06/18/2023).     CC Follow-up assessment for OSA Follow-up assessment for asthma   Subjective:   Re:OSA Patient uses and benefits from therapy Using CPAP nightly and with naps Pressure setting is comfortable and is sleeping well. Auto CPAP 5-10 AHI reduced to 0.8 Previous AHI 19 Less fatigue more energy  ZO:[id number] Diagnosed in 2004 Multiple inhalers in the past Will try TRELEGy inhaler  Assessment of ASTHMA FeNO 62   ppb-Elevated exhaled Nitric oxide  testing is highly consistent with type II inflammation Lab Results  Component Value Date   NITRICOXIDE 62 02/29/2024    Biological Agents discussed with patient Plan to start new inhaler for now  No exacerbation at this time No evidence of heart failure at this time No evidence or signs of infection at this time No respiratory distress No fevers, chills, nausea, vomiting, diarrhea No evidence of lower extremity edema No evidence hemoptysis   Multiple nasal sprays and antihistamines Triggers, cold air, perfumes, dust, carpet  in bedroom    Physical Exam:  BP 116/70 (BP Location: Left Arm, Patient Position: Sitting, Cuff Size: Normal)   Pulse 70   Temp 97.6 F (36.4 C) (Temporal)   Ht 5' 4.5" (1.638 m)   Wt 175 lb (79.4 kg)   SpO2 97%   BMI 29.57 kg/m      Review of Systems: Gen:  Denies  fever, sweats, chills weight loss  HEENT: Denies blurred vision, double vision, ear pain, eye pain, hearing loss, nose bleeds, sore throat Cardiac:  No dizziness, chest pain or heaviness, chest tightness,edema, No JVD Resp:   + cough, -sputum production, +shortness of breath,-wheezing, -hemoptysis,  Other:  All other systems negative   Physical Examination:   General Appearance: No distress  EYES PERRLA, EOM intact.   NECK Supple, No JVD Pulmonary: normal breath sounds, No wheezing.  CardiovascularNormal S1,S2.  No m/r/g.   Abdomen: Benign, Soft, non-tender. Neurology UE/LE 5/5 strength, no focal deficits Ext pulses intact, cap refill intact ALL OTHER ROS ARE NEGATIVE     Pulmonary testing:  Spirometry 12/12 >> FEV1% 68 RAST 11/15/14 >> IgE 30, cats/mold PFT 02/07/16 >> FEV1 2.32 (92%), FEV1% 76, FEF 25-75% 1.56 (71%), TLC 4.25 (81%), DLCO 121, borderline BD from FEF 25-75 FeNO 12/01/18 >> 7 IgE 12/16/21 >> 23  Chest Imaging:  Coronary CT 04/04/21 >> stable 2 mm nodule LLL, coronary calcium  score 0 CT sinus 01/09/22 >> normal CT angio chest 07/18/22 >> mild dependent atelectasis  Sleep Tests:  PSG 09/06/07 >> AHI 19 CPAP 05/17/23 to 06/15/23 >> used on 30 of 30 nights with average 7 hrs 15 min.  Average AHI 0.7 with median CPAP 6 and 95 th percentile CPAP 8 cm H2O CPAP DL 54/0981 AutoCPAP 1-91       AHI 1, 100% compliance days >4hrs  Cardiac Tests:  Echo 01/23/21 >> EF 60 to 65%, grade 2 DD, RVSP 39.1 mmHg, mild MR  Social History:  She  reports that she quit smoking about 46 years ago. Her smoking use included cigarettes. She started smoking about 53 years ago. She has a 2.1 pack-year smoking history.  She has been exposed to tobacco smoke. She has never used smokeless tobacco. She reports that she does not drink alcohol and does not use drugs.  Family History:  Her family history includes AAA (abdominal aortic aneurysm) in her paternal uncle; Aortic dissection in her maternal aunt and paternal uncle; Breast cancer in her maternal aunt; COPD in her sister; Cancer in her cousin and paternal aunt; Cancer (age of onset: 57) in her father; Cancer (age of onset: 31) in her mother; Colon polyps in her sister; Heart attack in her paternal uncle; Heart disease in her paternal uncle; Heart failure in her sister; Hypertension in her sister; Stroke in her paternal uncle and sister.     Assessment/Plan:  73 year old pleasant white female seen today for follow-up assessment for underlying sleep apnea underlying asthma with chronic allergic rhinitis     Assessment of OSA Previous AHI 19 Continue CPAP as prescribed  Excellent compliance report Reviewed compliance report in detail with patient Patient definitely benefits the use of CPAP therapy as prescribed Using CPAP nightly and with naps Pressure setting is comfortable and is sleeping well. CPAP prescription 5-10 AHI reduced to 0.8  No evidence of acute heart failure at this time No respiratory distress No fevers, chills, nausea, vomiting, diarrhea No evidence hemoptysis  Patient Instructions Continue to use CPAP every night, minimum of 4-6 hours a night.  Change equipment every 30 days or as directed by DME.  Wash your tubing with warm soap and water daily, hang to dry. Wash humidifier portion weekly. Use bottled, distilled water and change daily   Be aware of reduced alertness and do not drive or operate heavy machinery if experiencing this or drowsiness.  Exercise encouraged, as tolerated. Encouraged proper weight management.  Important to get eight or more hours of sleep  Limiting the use of the computer and television before bedtime.   Decrease naps during the day, so night time sleep will become enhanced.  Limit caffeine, and sleep deprivation.  HTN, stroke, uncontrolled diabetes and heart failure are potential risk factors.  Risk of untreated sleep apnea including cardiac arrhthymias, stroke, DM, pulm HTN.   Chronic allergic rhinitis FlonFlonase 2 sprays each nostril once pre day Atrovent  nasal spray/Astelin  spray needed Continue Zyrtec    Allergic asthma-asthma exacerbation at this time FENO check 62 Plan to start Trelegy inhaler Will NOT start biological agents at this time Patient is waiting for RHEUMOTOGICAL DX  Avoid Allergens and Irritants Avoid secondhand smoke Avoid SICK contacts Recommend  Masking  when appropriate Recommend Keep up-to-date with vaccinations    Medication List:   Allergies as of 02/29/2024       Reactions   Codeine Other (See Comments)   Low blood pressure/nausea   Olodaterol Palpitations   Qvar  [beclomethasone] Other (See Comments)   Thrush per patient even with rinsing.    Sulfonamide Derivatives Rash   rash        Medication List        Accurate as of Feb 29, 2024  8:19 AM. If you have any questions, ask your nurse or doctor.          AeroChamber MV inhaler Use as instructed   albuterol   108 (90 Base) MCG/ACT inhaler Commonly known as: Proventil  HFA Inhale 2 puffs into the lungs every 6 (six) hours as needed.   ALPRAZolam  0.25 MG tablet Commonly known as: XANAX  TAKE 1 TABLET (0.25 MG TOTAL) BY MOUTH ONCE DAILY AS NEEDED FOR SLEEP.   atorvastatin  40 MG tablet Commonly known as: LIPITOR Take 1 tablet (40 mg total) by mouth daily.   azelastine  0.1 % nasal spray Commonly known as: ASTELIN  Place 2 sprays into both nostrils 2 (two) times daily. Use in each nostril as directed   BENEFIBER DRINK MIX PO Take by mouth. Use as directed   benzonatate  200 MG capsule Commonly known as: TESSALON  Take 1 capsule (200 mg total) by mouth 3 (three) times daily as  needed for cough.   Blood Glucose Calibration High Liqd Used to check accuracy of blood glucose machine.   cetirizine  10 MG tablet Commonly known as: ZYRTEC  Take 1 tablet (10 mg total) by mouth daily.   chlorpheniramine  4 MG tablet Commonly known as: CHLOR-TRIMETON  Take 1 tablet (4 mg total) by mouth at bedtime as needed for allergies or rhinitis.   ciclopirox 8 % solution Commonly known as: PENLAC Apply topically at bedtime. Apply over nail and surrounding skin. Apply daily over previous coat. After seven (7) days, may remove with alcohol and continue cycle.   COQ10 PO Take by mouth daily.   cyanocobalamin  1000 MCG/ML injection Commonly known as: VITAMIN B12 Inject 1 mL (1,000 mcg total) into the muscle every 30 (thirty) days.   cycloSPORINE  0.05 % ophthalmic emulsion Commonly known as: RESTASIS  Place 1 drop into both eyes 2 (two) times daily.   desonide 0.05 % cream Commonly known as: DESOWEN Apply topically as needed.   diazepam  5 MG tablet Commonly known as: VALIUM  Take 1 tablet (5 mg total) by mouth every 12 (twelve) hours as needed (Vertigo).   EPINEPHrine  0.3 mg/0.3 mL Soaj injection Commonly known as: EPI-PEN 0.3 mg by injection route.   estradiol  0.1 MG/24HR patch Commonly known as: VIVELLE -DOT Place 1 patch (0.1 mg total) onto the skin 2 (two) times a week.   famotidine  20 MG tablet Commonly known as: PEPCID  Take 1 tablet (20 mg total) by mouth daily as needed for heartburn or indigestion.   fluticasone  50 MCG/ACT nasal spray Commonly known as: FLONASE  Place 1 spray into both nostrils daily.   hydrocortisone  2.5 % rectal cream Commonly known as: ANUSOL -HC Place 1 Application rectally 2 (two) times daily. For 10 days and then as needed   ipratropium 0.03 % nasal spray Commonly known as: ATROVENT  Place 2 sprays into both nostrils 3 (three) times daily as needed for rhinitis.   levothyroxine  50 MCG tablet Commonly known as: Synthroid  Take 1 tablet  (50 mcg total) by mouth daily before breakfast.   losartan  50 MG tablet Commonly known as: COZAAR  Take 1 tablet (50 mg total) by mouth daily.   loteprednol 0.2 % Susp Commonly known as: LOTEMAX Place 1 drop into both eyes 2 (two) times daily.   Lotrimin  AF 2 % Aero Generic drug: Miconazole Nitrate Apply topically.   MAGNESIUM  PO Take 250 mg by mouth daily.   meclizine  25 MG tablet Commonly known as: ANTIVERT  Take 1 tablet (25 mg total) by mouth 3 (three) times daily as needed (vertigo).   metoprolol  succinate 50 MG 24 hr tablet Commonly known as: TOPROL -XL TAKE 1 TABLET BY MOUTH EVERY DAY   montelukast  10 MG tablet Commonly known as: SINGULAIR  Take 1 tablet (10 mg total) by  mouth at bedtime.   multivitamin tablet Take 1 tablet by mouth daily.   mupirocin  ointment 2 % Commonly known as: BACTROBAN  Apply topically as needed.   NONFORMULARY OR COMPOUNDED ITEM Spironolactone  (PF) 0.005 mg opth. Both eyes 4 times daily   omega-3 acid ethyl esters 1 g capsule Commonly known as: Lovaza  Take 1 capsule (1 g total) by mouth 2 (two) times daily.   ondansetron  4 MG disintegrating tablet Commonly known as: Zofran  ODT Take 1 tablet (4 mg total) by mouth every 8 (eight) hours as needed for nausea or vomiting.   OneTouch Delica Lancets 33G Misc Used to check blood sugars twice daly.   OneTouch Verio test strip Generic drug: glucose blood Used to check blood sugars 3 times daily.   Polyethyl Glycol-Propyl Glycol 0.4-0.3 % Soln Apply to eye.   Premarin  vaginal cream Generic drug: conjugated estrogens  Place vaginally 2 (two) times a week. INSERT 0.5 APPLICATORS FULL VAGINALLY TWICE PER WEEK. AT BEDTIME.   PROBIOTIC DAILY PO Take by mouth.   Propylene Glycol 0.6 % Soln Apply 1 drop to eye as needed.   Spiriva  Respimat 1.25 MCG/ACT Aers Generic drug: Tiotropium Bromide Monohydrate  Inhale 2 puffs into the lungs daily.   STOOL SOFTENER PO Take 1 tablet by mouth as  needed.   SYRINGE 3CC/20GX1" 20G X 1" 3 ML Misc 1 Syringe by Does not apply route every 30 (thirty) days. For use with b-12 injection monthly   tretinoin  0.05 % cream Commonly known as: RETIN-A  Apply topically at bedtime.   urea  40 % Crea Commonly known as: CARMOL Apply topically.   Vitamin D3 50 MCG (2000 UT) Tabs Take 1 tablet by mouth daily.        MEDICATION ADJUSTMENTS/LABS AND TESTS ORDERED: Recommend TRELEGY one puff once per day Please rinse mouth and gargle after every use Flonase  2 sprays each nostril once pre day Atrovent  nasal spray/Astelin  spray needed Continue Zyrtec  Avoid Allergens and Irritants Avoid secondhand smoke Avoid SICK contacts Recommend  Masking  when appropriate Recommend Keep up-to-date with vaccinations Excellent Job A+ GOLD STAR!! Continue CPAP as prescribed    CURRENT MEDICATIONS REVIEWED AT LENGTH WITH PATIENT TODAY   Patient  satisfied with Plan of action and management. All questions answered  Follow up in 3 months   I spent a total of 54 minutes reviewing chart data, face-to-face evaluation with the patient, counseling and coordination of care as detailed above.      Lady Pier, M.D.  [facility]  Medical Director [facility] Medical Director [facility] Cardio-Pulmonary Department

## 2024-03-03 ENCOUNTER — Ambulatory Visit (INDEPENDENT_AMBULATORY_CARE_PROVIDER_SITE_OTHER): Admitting: Nurse Practitioner

## 2024-03-03 ENCOUNTER — Encounter: Payer: Self-pay | Admitting: Nurse Practitioner

## 2024-03-03 VITALS — BP 118/76 | HR 74 | Temp 98.6°F | Resp 20 | Ht 64.5 in | Wt 173.1 lb

## 2024-03-03 DIAGNOSIS — M545 Low back pain, unspecified: Secondary | ICD-10-CM

## 2024-03-03 DIAGNOSIS — M542 Cervicalgia: Secondary | ICD-10-CM

## 2024-03-03 MED ORDER — CETIRIZINE HCL 10 MG PO TABS: 10.0000 mg | ORAL_TABLET | Freq: Every day | ORAL | 3 refills | Status: AC

## 2024-03-03 MED ORDER — CYCLOBENZAPRINE HCL 5 MG PO TABS: 5.0000 mg | ORAL_TABLET | Freq: Every day | ORAL | 0 refills | Status: DC

## 2024-03-03 NOTE — Patient Instructions (Signed)
 Acute Back Pain, Adult Acute back pain is sudden and usually short-lived. It is often caused by an injury to the muscles and tissues in the back. The injury may result from: A muscle, tendon, or ligament getting overstretched or torn. Ligaments are tissues that connect bones to each other. Lifting something improperly can cause a back strain. Wear and tear (degeneration) of the spinal disks. Spinal disks are circular tissue that provide cushioning between the bones of the spine (vertebrae). Twisting motions, such as while playing sports or doing yard work. A hit to the back. Arthritis. You may have a physical exam, lab tests, and imaging tests to find the cause of your pain. Acute back pain usually goes away with rest and home care. Follow these instructions at home: Managing pain, stiffness, and swelling Take over-the-counter and prescription medicines only as told by your health care provider. Treatment may include medicines for pain and inflammation that are taken by mouth or applied to the skin, or muscle relaxants. Your health care provider may recommend applying ice during the first 24-48 hours after your pain starts. To do this: Put ice in a plastic bag. Place a towel between your skin and the bag. Leave the ice on for 20 minutes, 2-3 times a day. Remove the ice if your skin turns bright red. This is very important. If you cannot feel pain, heat, or cold, you have a greater risk of damage to the area. If directed, apply heat to the affected area as often as told by your health care provider. Use the heat source that your health care provider recommends, such as a moist heat pack or a heating pad. Place a towel between your skin and the heat source. Leave the heat on for 20-30 minutes. Remove the heat if your skin turns bright red. This is especially important if you are unable to feel pain, heat, or cold. You have a greater risk of getting burned. Activity  Do not stay in bed. Staying in  bed for more than 1-2 days can delay your recovery. Sit up and stand up straight. Avoid leaning forward when you sit or hunching over when you stand. If you work at a desk, sit close to it so you do not need to lean over. Keep your chin tucked in. Keep your neck drawn back, and keep your elbows bent at a 90-degree angle (right angle). Sit high and close to the steering wheel when you drive. Add lower back (lumbar) support to your car seat, if needed. Take short walks on even surfaces as soon as you are able. Try to increase the length of time you walk each day. Do not sit, drive, or stand in one place for more than 30 minutes at a time. Sitting or standing for long periods of time can put stress on your back. Do not drive or use heavy machinery while taking prescription pain medicine. Use proper lifting techniques. When you bend and lift, use positions that put less stress on your back: [place] your knees. Keep the load close to your body. Avoid twisting. Exercise regularly as told by your health care provider. Exercising helps your back heal faster and helps prevent back injuries by keeping muscles strong and flexible. Work with a physical therapist to make a safe exercise program, as recommended by your health care provider. Do any exercises as told by your physical therapist. Lifestyle Maintain a healthy weight. Extra weight puts stress on your back and makes it difficult to have good  posture. Avoid activities or situations that make you feel anxious or stressed. Stress and anxiety increase muscle tension and can make back pain worse. Learn ways to manage anxiety and stress, such as through exercise. General instructions Sleep on a firm mattress in a comfortable position. Try lying on your side with your knees slightly bent. If you lie on your back, put a pillow under your knees. Keep your head and neck in a straight line with your spine (neutral position) when using electronic equipment like  smartphones or pads. To do this: Raise your smartphone or pad to look at it instead of bending your head or neck to look down. Put the smartphone or pad at the level of your face while looking at the screen. Follow your treatment plan as told by your health care provider. This may include: Cognitive or behavioral therapy. Acupuncture or massage therapy. Meditation or yoga. Contact a health care provider if: You have pain that is not relieved with rest or medicine. You have increasing pain going down into your legs or buttocks. Your pain does not improve after 2 weeks. You have pain at night. You lose weight without trying. You have a fever or chills. You develop nausea or vomiting. You develop abdominal pain. Get help right away if: You develop new bowel or bladder control problems. You have unusual weakness or numbness in your arms or legs. You feel faint. These symptoms may represent a serious problem that is an emergency. Do not wait to see if the symptoms will go away. Get medical help right away. Call your local emergency services (911 in the [place] Do not drive yourself to the hospital. Summary Acute back pain is sudden and usually short-lived. Use proper lifting techniques. When you bend and lift, use positions that put less stress on your back. Take over-the-counter and prescription medicines only as told by your health care provider, and apply heat or ice as told. This information is not intended to replace advice given to you by your health care provider. Make sure you discuss any questions you have with your health care provider. Document Revised: 01/04/2021 Document Reviewed: 01/04/2021 Elsevier Patient Education  2024 [employer].

## 2024-03-03 NOTE — Progress Notes (Signed)
 Established Patient Office Visit  Subjective:  Patient ID: Angela Mendoza, female    DOB: [birth year]  Age: 73 y.o. MRN: [id number]  CC:  Chief Complaint  Patient presents with   Back Pain    Lower and up sometimes the shoulder and upper back X several months got worse in the last month   Discussed the use of a AI scribe software for clinical note transcription with the patient, who gave verbal consent to proceed.  HPI  Angela Mendoza "Archie Bearded" is a 73 year old female who presents with worsening back pain.  She has experienced worsening lower back pain over the past four to five months, primarily on both sides, with occasional radiation into the hip. The pain is severe, worsens with prolonged standing or activities, and is minimally relieved by Tylenol. Sitting or reclining eases the pain.  She has a history of spinal issues and is concerned about osteoporosis or osteoarthritis. She has arthritis affecting her fingers.  She also experiences neck pain with 'crunching' sounds, sometimes radiating into her head, causing unilateral headaches. A special pillow helps with neck alignment and CPAP use. No leg pain or systemic symptoms are present.   HPI   Past Medical History:  Diagnosis Date   Agatston coronary artery calcium  score less than 100    a. 06/2018 Cardiac CT: Ca2+ = 3 (49th %'ile).   Allergic rhinitis 06/09/2008   chronic   Allergy     Aortic atherosclerosis (HCC)    a. 06/2018 noted on chest CT.   Arthritis    Asthma, intrinsic 10/06/2011   controlled on qvar . Spiro 09/2011:  Very mild airflow obstruction (FEV1% 68, FVL c/w obstruction)    Basal cell carcinoma 06/10/2021   right prox nasal ala rim - MOHs 07/12/21 Dr. Debrah Fan   Cataract    Chronic headache 06/09/2008   COPD (chronic obstructive pulmonary disease) (HCC)    Depression    Diverticulosis 2013   h/o diverticulitis   Dry eyes    GERD (gastroesophageal reflux disease)    Heart murmur    History of anal  fissures    x2   History of breast implant removal    silicone mastitis - R axilla silicone LN, free silicone L breast upper outer quadrant   Hot flashes    HYPERLIPIDEMIA 06/09/2008   HYPERTENSION 06/09/2008   Hypothyroidism    MVP (mitral valve prolapse)    OSA on CPAP    [name]   PAF (paroxysmal atrial fibrillation) (HCC) 06/09/2008   a. CHA2DS2VASc = 3-->prefers ASA.   Pernicious anemia    per prior pcp   Prediabetes    6.9 previous A1c   Rosacea    Sleep apnea    Surgical menopause 1991   Vertigo    Vitamin D  deficiency     Past Surgical History:  Procedure Laterality Date   APPENDECTOMY  2007   bladder tack     bladder tack  08/2022   additional 12/24   BREAST BIOPSY Left 09/27/2009   BREAST ENHANCEMENT SURGERY  2006   and removal   CARDIOVASCULAR STRESS TEST  2013   [facility] Dr. Sandee Crook - WNL, EF 55-60%, with 0 calcium  score   CESAREAN SECTION     CHOLECYSTECTOMY  2007   biliary dyskinesia   COLONOSCOPY  08/2012   diverticulosis   COLONOSCOPY  2006   inflamed hyperplastic polyp   ESOPHAGOGASTRODUODENOSCOPY  08/2012   esophageal reflux   ESOPHAGOGASTRODUODENOSCOPY  11/02/2008   Dr Venice Gillis  Minimal gastritis. Irregular Z line suggestive of gastroesophageal reflux (biopsied to rule out short-segment Barrett's esophagus) Incidental gastric polyps   HERNIA REPAIR     NASAL SINUS SURGERY     RECTOCELE REPAIR  12/2008   Dr. Rosamond Comes   sleep study  08/2007   OSA, 12 cm H2O,   TOTAL ABDOMINAL HYSTERECTOMY  1991   heavy bleeding, ovaries removed   TRIGGER FINGER RELEASE Right 06/18/2023   TUBAL LIGATION     tummy tuck     US  ECHOCARDIOGRAPHY  09/2009   impaired relaxation, mild tric regurg, normal MV, EF 60%   US  ECHOCARDIOGRAPHY  11/2007   mild-mod MR    Family History  Problem Relation Age of Onset   Cancer Mother 74       lung, passive smoke   Cancer Father 20       lung, smoker   Colon polyps Sister    COPD Sister    Stroke Sister        TIA    Hypertension Sister    Heart failure Sister    Aortic dissection Maternal Aunt    Breast cancer Maternal Aunt    Cancer Paternal Aunt        breast, lung   Aortic dissection Paternal Uncle    Heart attack Paternal Uncle    AAA (abdominal aortic aneurysm) Paternal Uncle    Heart disease Paternal Uncle    Stroke Paternal Uncle    Cancer Cousin        breast   Colon cancer Neg Hx    Rectal cancer Neg Hx    Stomach cancer Neg Hx    Esophageal cancer Neg Hx     Social History   Socioeconomic History   Marital status: Married    Spouse name: Not on file   Number of children: Not on file   Years of education: Not on file   Highest education level: Associate degree: occupational, Scientist, product/process development, or vocational program  Occupational History   Occupation: retired  Tobacco Use   Smoking status: Former    Current packs/day: 0.00    Average packs/day: 0.3 packs/day for 7.0 years (2.1 ttl pk-yrs)    Types: Cigarettes    Start date: 10/27/1970    Quit date: 10/27/1977    Years since quitting: 46.4    Passive exposure: Past   Smokeless tobacco: Never   Tobacco comments:    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 Hx of children who are grown  Vaping Use   Vaping status: Never Used  Substance and Sexual Activity   Alcohol use: Never   Drug use: No   Sexual activity: Not on file  Other Topics Concern   Not on file  Social History Narrative   Lives with husband    Grown children   Occ: retired, was Research scientist (medical)   Edu: trade degree then 2 yrscollege   Social Drivers of Health   Financial Resource Strain: Medium Risk (02/10/2024)   Received from [facility]   Overall Financial Resource Strain (CARDIA)    Difficulty of Paying Living Expenses: Somewhat hard  Food Insecurity: No Food Insecurity (02/10/2024)   Received from [facility]   Hunger Vital Sign    Worried About Running Out of Food in the Last Year: Never true    Ran Out of  Food in the Last Year: Never true  Transportation Needs: No Transportation Needs (02/10/2024)   Received from [facility]   PRAPARE - Transportation    In the past 12 months, has lack of transportation kept you from medical appointments or from getting medications?: No    Lack of Transportation (Non-Medical): No  Physical Activity: Insufficiently Active (12/20/2023)   Exercise Vital Sign    Days of Exercise per Week: 4 days    Minutes of Exercise per Session: 20 min  Stress: Stress Concern Present (12/20/2023)   [employer] of Occupational Health - Occupational Stress Questionnaire    Feeling of Stress : To some extent  Social Connections: Unknown (12/20/2023)   Social Connection and Isolation Panel [NHANES]    Frequency of Communication with Friends and Family: More than three times a week    Frequency of Social Gatherings with Friends and Family: Patient declined    Attends Religious Services: Patient declined    Database administrator or Organizations: Patient declined    Attends Banker Meetings: Never    Marital Status: Married  Catering manager Violence: Not At Risk (06/01/2023)   Humiliation, Afraid, Rape, and Kick questionnaire    Fear of Current or Ex-Partner: No    Emotionally Abused: No    Physically Abused: No    Sexually Abused: No     Outpatient Medications Prior to Visit  Medication Sig Dispense Refill   albuterol  (PROVENTIL  HFA) 108 (90 Base) MCG/ACT inhaler Inhale 2 puffs into the lungs every 6 (six) hours as needed. 18 g 2   ALPRAZolam  (XANAX ) 0.25 MG tablet TAKE 1 TABLET (0.25 MG TOTAL) BY MOUTH ONCE DAILY AS NEEDED FOR SLEEP. 30 tablet 5   azelastine  (ASTELIN ) 0.1 % nasal spray Place 2 sprays into both nostrils 2 (two) times daily. Use in each nostril as directed 90 mL 3   benzonatate  (TESSALON ) 200 MG capsule Take 1 capsule (200 mg total) by mouth 3 (three) times daily as needed for cough. 30 capsule 1   Blood Glucose Calibration  High LIQD Used to check accuracy of blood glucose machine. 1 each 0   cetirizine  (ZYRTEC ) 10 MG tablet Take 1 tablet (10 mg total) by mouth daily. 90 tablet 3   chlorpheniramine  (CHLOR-TRIMETON ) 4 MG tablet Take 1 tablet (4 mg total) by mouth at bedtime as needed for allergies or rhinitis. 90 tablet 1   Cholecalciferol (VITAMIN D3) 50 MCG (2000 UT) TABS Take 1 tablet by mouth daily. 90 tablet 3   ciclopirox (PENLAC) 8 % solution Apply topically at bedtime. Apply over nail and surrounding skin. Apply daily over previous coat. After seven (7) days, may remove with alcohol and continue cycle.     Coenzyme Q10 (COQ10 PO) Take by mouth daily.     conjugated estrogens  (PREMARIN ) vaginal cream Place vaginally 2 (two) times a week. INSERT 0.5 APPLICATORS FULL VAGINALLY TWICE PER WEEK. AT BEDTIME. 30 g 4   cyanocobalamin  (VITAMIN B12) 1000 MCG/ML injection Inject 1 mL (1,000 mcg total) into the muscle every 30 (thirty) days. 3 mL 3   cycloSPORINE  (RESTASIS ) 0.05 % ophthalmic emulsion Place 1 drop into both eyes 2 (two) times daily. 3 each 3   desonide (DESOWEN) 0.05 % cream Apply topically as needed.     diazepam  (VALIUM ) 5 MG tablet Take 1 tablet (5 mg total) by mouth every 12 (twelve) hours as needed (Vertigo). 30 tablet 0   Docusate Calcium  (STOOL SOFTENER PO) Take 1 tablet by mouth as needed.     EPINEPHrine  0.3 mg/0.3 mL IJ SOAJ injection 0.3 mg by injection route.  1 each 1   estradiol  (VIVELLE -DOT) 0.1 MG/24HR patch Place 1 patch (0.1 mg total) onto the skin 2 (two) times a week. 24 patch 3   famotidine  (PEPCID ) 20 MG tablet Take 1 tablet (20 mg total) by mouth daily as needed for heartburn or indigestion. 90 tablet 3   fluticasone  (FLONASE ) 50 MCG/ACT nasal spray Place 1 spray into both nostrils daily. 48 g 3   Fluticasone -Umeclidin-Vilant (TRELEGY ELLIPTA ) 100-62.5-25 MCG/ACT AEPB Inhale 1 Act into the lungs daily. 3 each 10   Fluticasone -Umeclidin-Vilant (TRELEGY ELLIPTA ) 100-62.5-25 MCG/ACT AEPB  Inhale 1 puff into the lungs daily.     glucose blood (ONETOUCH VERIO) test strip Used to check blood sugars 3 times daily. 300 each 3   hydrocortisone  (ANUSOL -HC) 2.5 % rectal cream Place 1 Application rectally 2 (two) times daily. For 10 days and then as needed 30 g 4   ipratropium (ATROVENT ) 0.03 % nasal spray Place 2 sprays into both nostrils 3 (three) times daily as needed for rhinitis. 90 mL 3   levothyroxine  (SYNTHROID ) 50 MCG tablet Take 1 tablet (50 mcg total) by mouth daily before breakfast. 90 tablet 3   losartan  (COZAAR ) 50 MG tablet Take 1 tablet (50 mg total) by mouth daily. 90 tablet 1   MAGNESIUM  PO Take 250 mg by mouth daily.     meclizine  (ANTIVERT ) 25 MG tablet Take 1 tablet (25 mg total) by mouth 3 (three) times daily as needed (vertigo). 84 tablet 3   metoprolol  succinate (TOPROL -XL) 50 MG 24 hr tablet TAKE 1 TABLET BY MOUTH EVERY DAY 90 tablet 3   Miconazole Nitrate (LOTRIMIN  AF) 2 % AERO Apply topically.     montelukast  (SINGULAIR ) 10 MG tablet Take 1 tablet (10 mg total) by mouth at bedtime. 90 tablet 1   Multiple Vitamin (MULTIVITAMIN) tablet Take 1 tablet by mouth daily.     mupirocin  ointment (BACTROBAN ) 2 % Apply topically as needed.     NONFORMULARY OR COMPOUNDED ITEM Spironolactone  (PF) 0.005 mg opth. Both eyes 4 times daily     omega-3 acid ethyl esters (LOVAZA ) 1 g capsule Take 1 capsule (1 g total) by mouth 2 (two) times daily. 90 capsule 3   ondansetron  (ZOFRAN  ODT) 4 MG disintegrating tablet Take 1 tablet (4 mg total) by mouth every 8 (eight) hours as needed for nausea or vomiting. 30 tablet 2   OneTouch Delica Lancets 33G MISC Used to check blood sugars twice daly. 300 each 3   Polyethyl Glycol-Propyl Glycol 0.4-0.3 % SOLN Apply to eye.     Probiotic Product (PROBIOTIC DAILY PO) Take by mouth.     Propylene Glycol 0.6 % SOLN Apply 1 drop to eye as needed.     Spacer/Aero-Holding Chambers (AEROCHAMBER MV) inhaler Use as instructed 1 each 0   Syringe/Needle,  Disp, (SYRINGE 3CC/20GX1") 20G X 1" 3 ML MISC 1 Syringe by Does not apply route every 30 (thirty) days. For use with b-12 injection monthly 50 each 0   tretinoin  (RETIN-A ) 0.05 % cream Apply topically at bedtime. 45 g 1   urea  (CARMOL) 40 % CREA Apply topically.     Wheat Dextrin (BENEFIBER DRINK MIX PO) Take by mouth. Use as directed     atorvastatin  (LIPITOR) 40 MG tablet Take 1 tablet (40 mg total) by mouth daily. 90 tablet 0   loteprednol (LOTEMAX) 0.2 % SUSP Place 1 drop into both eyes 2 (two) times daily. (Patient not taking: Reported on 02/29/2024)     Tiotropium Bromide Monohydrate  (  SPIRIVA  RESPIMAT) 1.25 MCG/ACT AERS Inhale 2 puffs into the lungs daily. 3 each 3   No facility-administered medications prior to visit.    Allergies  Allergen Reactions   Codeine Other (See Comments)    Low blood pressure/nausea   Olodaterol Palpitations   Qvar  [Beclomethasone] Other (See Comments)    Thrush per patient even with rinsing.    Sulfonamide Derivatives Rash    rash    ROS Review of Systems Negative unless indicated in HPI.    Objective:     Physical Exam Constitutional:      Appearance: Normal appearance.  Cardiovascular:     Rate and Rhythm: Normal rate and regular rhythm.     Pulses: Normal pulses.     Heart sounds: Normal heart sounds.  Musculoskeletal:        General: Tenderness present.     Cervical back: Normal range of motion. Tenderness and crepitus present.     Lumbar back: Normal. Negative right straight leg raise test and negative left straight leg raise test.  Neurological:     General: No focal deficit present.     Mental Status: She is alert. Mental status is at baseline.  Psychiatric:        Mood and Affect: Mood normal.        Behavior: Behavior normal.        Thought Content: Thought content normal.     BP 118/76   Pulse 74   Temp 98.6 F (37 C)   Resp 20   Ht 5' 4.5" (1.638 m)   Wt 173 lb 2 oz (78.5 kg)   SpO2 99%   BMI 29.26 kg/m  Wt  Readings from Last 3 Encounters:  03/03/24 173 lb 2 oz (78.5 kg)  02/29/24 175 lb (79.4 kg)  02/09/24 171 lb (77.6 kg)     Health Maintenance  Topic Date Due   COVID-19 Vaccine (5 - 2024-25 season) 06/28/2023   OPHTHALMOLOGY EXAM  03/29/2024   MAMMOGRAM  05/19/2024   INFLUENZA VACCINE  05/27/2024   Medicare Annual Wellness (AWV)  05/31/2024   HEMOGLOBIN A1C  06/12/2024   FOOT EXAM  06/18/2024   Diabetic kidney evaluation - eGFR measurement  12/13/2024   Diabetic kidney evaluation - Urine ACR  12/13/2024   Colonoscopy  08/23/2027   DTaP/Tdap/Td (7 - Td or Tdap) 08/16/2033   Pneumonia Vaccine 109+ Years old  Completed   DEXA SCAN  Completed   Hepatitis C Screening  Completed   Zoster Vaccines- Shingrix  Completed   HPV VACCINES  Aged Out   Meningococcal B Vaccine  Aged Out    There are no preventive care reminders to display for this patient.  Lab Results  Component Value Date   TSH 3.37 12/14/2023   Lab Results  Component Value Date   WBC 5.4 05/04/2023   HGB 13.0 05/04/2023   HCT 40.1 05/04/2023   MCV 94.4 05/04/2023   PLT 261 05/04/2023   Lab Results  Component Value Date   NA 137 12/14/2023   K 4.1 12/14/2023   CO2 28 12/14/2023   GLUCOSE 96 12/14/2023   BUN 12 12/14/2023   CREATININE 0.84 12/14/2023   BILITOT 1.1 02/23/2024   ALKPHOS 73 02/23/2024   AST 20 02/23/2024   ALT 20 02/23/2024   PROT 6.6 02/23/2024   ALBUMIN 4.5 02/23/2024   CALCIUM  9.3 12/14/2023   ANIONGAP 9 05/04/2023   EGFR 62 05/30/2022   GFR 69.10 12/14/2023   Lab Results  Component Value Date   CHOL 163 02/23/2024   Lab Results  Component Value Date   HDL 47 02/23/2024   Lab Results  Component Value Date   LDLCALC 93 02/23/2024   Lab Results  Component Value Date   TRIG 130 02/23/2024   Lab Results  Component Value Date   CHOLHDL 3.5 02/23/2024   Lab Results  Component Value Date   HGBA1C 6.3 12/14/2023      Assessment & Plan:  Acute bilateral low back pain  without sciatica Assessment & Plan: Chronic lower back pain radiating to hips, worsened by activity, relieved by rest. Differential includes osteoarthritis or degenerative changes. She prefers non-pharmacological interventions. - Order lumbar spine x-ray to assess for degenerative changes or abnormalities. - Advise use of heating pad for pain relief.  Orders: -     DG Lumbar Spine 2-3 Views; Future  Cervical pain (neck) Assessment & Plan: Cervical pain with crunching sounds and occasional headaches, possibly due to degenerative changes. Increased home responsibilities may contribute. - Order cervical spine x-ray to assess for degenerative changes. - Prescribe Flexeril  5 mg at bedtime for muscle relaxation and pain relief, caution about drowsiness. - Advise use of special pillow for neck support. - Recommend massage therapy or use of massager for muscle relaxation.   Orders: -     DG Cervical Spine Complete; Future  Other orders -     Cyclobenzaprine  HCl; Take 1 tablet (5 mg total) by mouth at bedtime.  Dispense: 30 tablet; Refill: 0    Follow-up: No follow-ups on file.   Tylie Golonka, NP

## 2024-03-03 NOTE — Addendum Note (Signed)
 Addended by: Chadwick Colonel on: 03/03/2024 10:01 AM   Modules accepted: Orders

## 2024-03-03 NOTE — Telephone Encounter (Signed)
 Spoke with pt and she stated that she is needing a refill on the zyrtec  sent to [facility]. I have refilled the medication. Pt also stated that her cardiologist would like to increase her cholesterol medication and she wasn't sure who she reach out to. I let pt know that she will need to contact her cardiologist. Pt gave a verbal understanding.  Also while on the phone with the pt she stated that she has been having back pain and would like to schedule an appt. Appt has been scheduled for today.

## 2024-03-07 MED ORDER — ATORVASTATIN CALCIUM 80 MG PO TABS
80.0000 mg | ORAL_TABLET | Freq: Every day | ORAL | 3 refills | Status: DC
Start: 2024-03-07 — End: 2024-06-03

## 2024-03-09 ENCOUNTER — Other Ambulatory Visit

## 2024-03-09 DIAGNOSIS — I480 Paroxysmal atrial fibrillation: Secondary | ICD-10-CM | POA: Diagnosis not present

## 2024-03-10 ENCOUNTER — Other Ambulatory Visit

## 2024-03-10 ENCOUNTER — Ambulatory Visit (INDEPENDENT_AMBULATORY_CARE_PROVIDER_SITE_OTHER)

## 2024-03-10 DIAGNOSIS — M545 Low back pain, unspecified: Secondary | ICD-10-CM

## 2024-03-10 DIAGNOSIS — M542 Cervicalgia: Secondary | ICD-10-CM | POA: Diagnosis not present

## 2024-03-10 DIAGNOSIS — M47816 Spondylosis without myelopathy or radiculopathy, lumbar region: Secondary | ICD-10-CM | POA: Diagnosis not present

## 2024-03-10 DIAGNOSIS — M47812 Spondylosis without myelopathy or radiculopathy, cervical region: Secondary | ICD-10-CM | POA: Diagnosis not present

## 2024-03-10 DIAGNOSIS — M4316 Spondylolisthesis, lumbar region: Secondary | ICD-10-CM | POA: Diagnosis not present

## 2024-03-13 DIAGNOSIS — I480 Paroxysmal atrial fibrillation: Secondary | ICD-10-CM | POA: Diagnosis not present

## 2024-03-14 ENCOUNTER — Ambulatory Visit: Payer: Self-pay | Admitting: Physician Assistant

## 2024-03-17 ENCOUNTER — Encounter: Payer: Self-pay | Admitting: Emergency Medicine

## 2024-03-19 ENCOUNTER — Ambulatory Visit: Payer: Self-pay | Admitting: Nurse Practitioner

## 2024-03-19 DIAGNOSIS — M545 Low back pain, unspecified: Secondary | ICD-10-CM | POA: Insufficient documentation

## 2024-03-19 DIAGNOSIS — M542 Cervicalgia: Secondary | ICD-10-CM | POA: Insufficient documentation

## 2024-03-19 NOTE — Assessment & Plan Note (Signed)
 Cervical pain with crunching sounds and occasional headaches, possibly due to degenerative changes. Increased home responsibilities may contribute. - Order cervical spine x-ray to assess for degenerative changes. - Prescribe Flexeril  5 mg at bedtime for muscle relaxation and pain relief, caution about drowsiness. - Advise use of special pillow for neck support. - Recommend massage therapy or use of massager for muscle relaxation.

## 2024-03-19 NOTE — Assessment & Plan Note (Signed)
 Chronic lower back pain radiating to hips, worsened by activity, relieved by rest. Differential includes osteoarthritis or degenerative changes. She prefers non-pharmacological interventions. - Order lumbar spine x-ray to assess for degenerative changes or abnormalities. - Advise use of heating pad for pain relief.

## 2024-03-30 ENCOUNTER — Other Ambulatory Visit: Payer: Self-pay | Admitting: Nurse Practitioner

## 2024-03-31 NOTE — Addendum Note (Signed)
 Addended by: Tona Francis on: 03/31/2024 05:03 AM   Modules accepted: Orders

## 2024-04-06 ENCOUNTER — Ambulatory Visit: Attending: Internal Medicine

## 2024-04-06 DIAGNOSIS — M5459 Other low back pain: Secondary | ICD-10-CM | POA: Diagnosis not present

## 2024-04-06 DIAGNOSIS — M545 Low back pain, unspecified: Secondary | ICD-10-CM | POA: Insufficient documentation

## 2024-04-06 DIAGNOSIS — M542 Cervicalgia: Secondary | ICD-10-CM | POA: Diagnosis not present

## 2024-04-06 NOTE — Therapy (Signed)
 OUTPATIENT PHYSICAL THERAPY EVALUATION   Patient Name: Angela Mendoza MRN: [id number] DOB:05-06-1951, 73 y.o., female Today's Date: 04/06/2024  END OF SESSION:  PT End of Session - 04/06/24 1437     Visit Number 1    Number of Visits 12    Date for PT Re-Evaluation 05/18/24    Authorization Type Medicare primary; [place] Secondary    Authorization Time Period 04/06/24-05/18/24    Progress Note Due on Visit 10    PT Start Time 1115    PT Stop Time 1205    PT Time Calculation (min) 50 min    Activity Tolerance Patient tolerated treatment well;No increased pain    Behavior During Therapy [facility] for tasks assessed/performed             Past Medical History:  Diagnosis Date   Agatston coronary artery calcium  score less than 100    a. 06/2018 Cardiac CT: Ca2+ = 3 (49th %'ile).   Allergic rhinitis 06/09/2008   chronic   Allergy     Aortic atherosclerosis (HCC)    a. 06/2018 noted on chest CT.   Arthritis    Asthma, intrinsic 10/06/2011   controlled on qvar . Spiro 09/2011:  Very mild airflow obstruction (FEV1% 68, FVL c/w obstruction)    Basal cell carcinoma 06/10/2021   right prox nasal ala rim - MOHs 07/12/21 Dr. Debrah Fan   Cataract    Chronic headache 06/09/2008   COPD (chronic obstructive pulmonary disease) (HCC)    Depression    Diverticulosis 2013   h/o diverticulitis   Dry eyes    GERD (gastroesophageal reflux disease)    Heart murmur    History of anal fissures    x2   History of breast implant removal    silicone mastitis - R axilla silicone LN, free silicone L breast upper outer quadrant   Hot flashes    HYPERLIPIDEMIA 06/09/2008   HYPERTENSION 06/09/2008   Hypothyroidism    MVP (mitral valve prolapse)    OSA on CPAP    [name]   PAF (paroxysmal atrial fibrillation) (HCC) 06/09/2008   a. CHA2DS2VASc = 3-->prefers ASA.   Pernicious anemia    per prior pcp   Prediabetes    6.9 previous A1c   Rosacea    Sleep apnea    Surgical menopause 1991   Vertigo     Vitamin D  deficiency    Past Surgical History:  Procedure Laterality Date   APPENDECTOMY  2007   bladder tack     bladder tack  08/2022   additional 12/24   BREAST BIOPSY Left 09/27/2009   BREAST ENHANCEMENT SURGERY  2006   and removal   CARDIOVASCULAR STRESS TEST  2013   [place] Dr. Sandee Crook - WNL, EF 55-60%, with 0 calcium  score   CESAREAN SECTION     CHOLECYSTECTOMY  2007   biliary dyskinesia   COLONOSCOPY  08/2012   diverticulosis   COLONOSCOPY  2006   inflamed hyperplastic polyp   ESOPHAGOGASTRODUODENOSCOPY  08/2012   esophageal reflux   ESOPHAGOGASTRODUODENOSCOPY  11/02/2008   Dr Venice Gillis Minimal gastritis. Irregular Z line suggestive of gastroesophageal reflux (biopsied to rule out short-segment Barrett's esophagus) Incidental gastric polyps   HERNIA REPAIR     NASAL SINUS SURGERY     RECTOCELE REPAIR  12/2008   Dr. Rosamond Comes   sleep study  08/2007   OSA, 12 cm H2O,   TOTAL ABDOMINAL HYSTERECTOMY  1991   heavy bleeding, ovaries removed   TRIGGER FINGER RELEASE Right 06/18/2023  TUBAL LIGATION     tummy tuck     US  ECHOCARDIOGRAPHY  09/2009   impaired relaxation, mild tric regurg, normal MV, EF 60%   US  ECHOCARDIOGRAPHY  11/2007   mild-mod MR   Patient Active Problem List   Diagnosis Date Noted   Cervical pain (neck) 03/19/2024   Acute bilateral low back pain without sciatica 03/19/2024   Meniere's disease 10/12/2023   Moderate persistent asthma 06/19/2023   Onychomycosis 11/20/2022   Leg swelling 07/18/2022   Bleeding external hemorrhoids 07/14/2022   Dysuria 07/08/2022   Skin lesion of back 07/04/2022   Hyperlipidemia associated with type 2 diabetes mellitus (HCC) 06/01/2022   Plantar fasciitis of left foot 06/01/2022   Controlled type 2 diabetes mellitus without complication, without long-term current use of insulin (HCC) 05/30/2022   Atrophic vaginitis 02/27/2022   Cystocele without uterine prolapse 02/27/2022   Chronic rhinitis 01/01/2022   Basal cell  carcinoma (BCC) of ala nasi 06/24/2021   COVID-19 virus infection 02/17/2021   LUQ abdominal pain 01/17/2021   Insomnia due to medical condition 05/29/2020   Educated about COVID-19 virus infection 02/10/2019   History of atrial fibrillation 07/02/2018   Paroxysmal atrial fibrillation (HCC) 07/02/2018   B12 deficiency 05/02/2018   Vaginal vault prolapse after hysterectomy 05/01/2018   Fatigue 05/01/2018   Trigger finger, right ring finger 06/09/2017   GAD (generalized anxiety disorder) 05/23/2017   Reaction to Pneumovax immunization 12/30/2015   Cerebral microvascular disease 10/12/2015   Fecal retention 04/14/2015   Allergic urticaria 11/18/2014   History of diverticulitis 11/11/2014   Family history of colonic polyps 11/11/2014   S/P cholecystectomy 11/11/2014   Upper airway cough syndrome 10/27/2014   Hyperkeratosis of sole 12/19/2013   GERD (gastroesophageal reflux disease)    Hypothyroidism    Menopausal syndrome    Asthma, intrinsic 10/06/2011   OSA on CPAP 06/09/2008   Essential hypertension, benign 06/09/2008   Non-seasonal allergic rhinitis 06/09/2008   History of cardiovascular disorder 06/09/2008    PCP: Thersia Flax, MD REFERRING PROVIDER: Thersia Flax, MD REFERRING DIAG: Low back pain, neck pain Rationale for Evaluation and Treatment: Rehabilitation THERAPY DIAG:  Other low back pain  Neck pain  ONSET DATE: a few months ago SUBJECTIVE:                                                                                                                                                                                           SUBJECTIVE STATEMENT: Pt wants to know if PT can help her with her back issue, she needs to know what to do, but doesn't want it to affect her  vertigo issues.   PERTINENT HISTORY:  73yoF referred to OPPT for daily central low back pain, insidious onset ~3 months ago, initially a problem toward end of active days in the kitchen, but in  the last few weeks has gotten worse with daily elements beginning upon first thing in morning. Pt cooks a lot and time on her feet in the kitchen can exacerbate symptoms, resulting in pt having to take regular sitting breaks, which only provide postural respite. Pt also reports a few instances of increased pain after sedentary days. Pt had lumbar xrays taken at PCP, unremarkable aside from some age-related changes. Pt also prescribed a muscle relaxer, but has not tried this at all. Pt reports taking intermittently 500mg  tylenol without much avail. Pt also reports similar presentation in neck pain with some difficulty performing full head turns when aggravated. PMH: Meniere's Disease; also sounds likely to have a secondary type of vertigo as well, flares ~2x per year.  PAIN:  Are you having pain? Yes, mild pain only at present  PRECAUTIONS: Non  RED FLAGS:  Pt denies any history of night sweats, denies recent unexplained weight loss, denies any throbbing qualities of back pain; Knee jerk DTR: 2+ bilat (04/06/24)   WEIGHT BEARING RESTRICTIONS: No FALLS:  Has patient fallen in last 6 months? No  LIVING ENVIRONMENT: Lives with: Husband, has emphesema and she provides mild to moderate caregiver assistance. Has following equipment at home: None OCCUPATION: Retired from hospital setting, laboratory.  PLOF: independent   PATIENT GOALS: figure out how to stop progression in pain of back, return to unrestricted kitchen activity.   OBJECTIVE:  Note: Objective measures were completed at evaluation unless otherwise noted.  DIAGNOSTIC FINDINGS:  LUMBAR SPINE - 2-3 VIEW   COMPARISON:  None Available.   FINDINGS: There is no evidence of lumbar spine fracture. Alignment is normal. Minimal facet joint sclerosis in the mid to lower lumbar spine. Minimal anterior spurring noted at L2 and L4. Minimal narrow intervertebral space at L5-S1. Prior cholecystectomy clips.   IMPRESSION: Minimal degenerative  joint changes of lumbar spine.    Electronically Signed   By: Anna Barnes M.D.   On: 03/11/2024 10:01  EXAM: CERVICAL SPINE - COMPLETE 4+ VIEW   COMPARISON:  October 24, 2009   FINDINGS: There is no evidence of cervical spine fracture or prevertebral soft tissue swelling. Mild grade 1 anterolisthesis of L2 on L3 noted. Mild narrow intervertebral spaces throughout the cervical spine, most prominently involving C6-7. Mild anterior spurring noted at C5, C6 and C7. Mild facet joint sclerosis noted throughout the cervical spine. No other significant bone abnormalities are identified.   IMPRESSION: Mild degenerative joint changes of cervical spine.   Electronically Signed   By: Anna Barnes M.D.   On: 03/11/2024 10:02  PATIENT SURVEYS:  MODI: will issue on visit 2  NDI: will issue on visit 2   SENSATION: Not tested; pt denies any frank anesthesia, or paresthesia.   POSTURE: increased upper thoracic kyphosis with increased cervical lordosis and forward head posturing.   LUMBAR ROM:   AROM eval  Flexion (standing toe touch) Minimal, hypomobile, maintains lordosis  Extension (standing arch) Minimal, hypomobile  Right rotation (seated twist)  45 degrees   Left rotation (seated twist) 60 degrees    LOWER EXTREMITY MMT:    MMT Right eval Left eval  Hip flexion 5/5 5/5  Hip extension deferred deffered  Hip horizontal abduction 5/5 5/5  Hip horizontal adduction 5/5 5/5  Hip internal rotation  5/5 5/5  Hip external rotation 5/5 5/5  Ankle dorsiflexion 5/5 5/5   (Blank rows = not tested)  FUNCTIONAL TESTS:  30sec chair rise: 18x  SLS: defer to visit 2   GAIT: Deferred; denies any gross changes or limitations in gait.    TODAY'S TREATMENT 04/07/24  -SUPINE DKTC stretch 2x60sec (using foot of plinth for passive stretch)  -Supine FABER stretch 2x45sec bilat (using foot of plinth for passive stretch) -Lower trunk rotations x20, alternating  -Prone press up x15     PATIENT EDUCATION:  Education details: discussed findings of imaging, endorsed WNL age-related changes without any mention of singular concerning diagnosis.  Person educated: Patient  Education method: collaborative learning, deliberate practice, positive reinforcement, explicit instruction, establish rules. Education comprehension: good   HOME EXERCISE PROGRAM: Access Code: [email or web address] URL: [email or web address] Date: 04/06/2024 Prepared by: Atlee Blanks  Exercises - Low back stretch, feet elevated  - 2 x daily - 7 x weekly - 3 reps - 60 hold - Supine Hip External Rotation Stretch  - 2 x daily - 7 x weekly - 3 sets - 2 reps - 60 hold - Supine Lower Trunk Rotation  - 2 x daily - 7 x weekly - 1 sets - 20 reps - Prone Press Up  - 2 x daily - 7 x weekly - 1 sets - 10 reps  ASSESSMENT:  CLINICAL IMPRESSION: 73yoF referred to OPPT for non-specific low back pain without radiculopathy, ongoing x3 months, no clear etiology and reassuring imaging studies, currently unable to tolerate her baseline household activities due to progressive pain and need to take seated breaks. Evaluation revealing of generally good strength in BLE and above average power in 30sec chair rise test. Assessment of spine mobility revealing of general hypomobility, limited ROM of lumbar spine with flexion, extension, and rotation. Defer more detailed assessment of neck pain c/o to later session, but can see how stiffness limitations in upper thoracic spine are also contributors to cervical ROM limitations and overuse. Pt will benefit from skilled PT intervention to reduce pain, teach symptoms management, and improve tolerance to household and leisure activities, all in order to elevate quality of life, allow pt to continue with care giver duties, and independent IADL performance.   OBJECTIVE IMPAIRMENTS: Decreased knowledge of condition, decreased use of DME, decreased mobility, difficulty walking, decreased  strength, decreased ROM. ACTIVITY LIMITATIONS: Lifting, standing, walking, squatting, transfers, locomotion level PARTICIPATION LIMITATIONS: Cleaning, laundry, interpersonal relationships, driving, yardwork, community activity.  PERSONAL FACTORS: Age, behavior pattern, education, past/current experiences, transportation, profession  are also affecting patient's functional outcome.  REHAB POTENTIAL: Good CLINICAL PRESENTATION:  Evolving/moderate complexity CLINICAL DECISION MAKING: Low  EVALUATION COMPLEXITY: Low  GOALS: Goals reviewed with patient? Yes  SHORT TERM GOALS: Target date: 05/06/24  Patient will report comprehension, confidence, and consistent compliance and of a simple home exercise program established to facilitate symptoms management and basic strengthening and/or segment mobility of lumbar spine.  Baseline: Goal status: INITIAL  2.  Patient will report comprehension, confidence, and consistent compliance and of a simple home exercise program established to facilitate symptoms management and basic strengthening and/or segment mobility of cervical spine.  Baseline:  Goal status: INITIAL  3.  Patient to report reduction in pain frequency from daily to <4 days/week to demonstrate improved activity tolerance in IADL and home care activity.  Baseline:  Goal status: INITIAL   LONG TERM GOALS: Target date: 05/18/24  Patient to improve score on MODI by 20% or greater to indicate reduced disability  and improved quality of life.   Baseline:  Goal status: INITIAL  2.  Patient to improve score on NDI by 15% or greater to indicate reduced disability and improved quality of life.  Baseline:  Goal status: INITIAL  3.  Pt to report confidence in knowledge of long-term spine mobility maintenance program to continue to improve joint mobility and reduced pain for the next >10 years.  Baseline:  Goal status: INITIAL  4.  Pt to report ability to perform all extended kitchen  activities without needing to take a sit break due to pain and without increase of pain >2/10.  Baseline:  Goal status: INITIAL  PLAN:  PT FREQUENCY: 1-2x/week PT DURATION: 6 weeks PLANNED INTERVENTIONS: 97750- Physical Performance Testing, 97110-Therapeutic exercises, 97530- Therapeutic activity, [id number]- Neuromuscular re-education, 97535- Self Care, 16109- Manual therapy, [id number]- Electrical stimulation (unattended), [phone]- Electrical stimulation (manual), Joint mobilization, Joint manipulation, Spinal manipulation, Spinal mobilization, Cryotherapy, and Moist heat.  PLAN FOR NEXT SESSION: MODI, NDI, HEP review + update to include thoracic spine mobility activities to reduce upper kyphosis, SLS assessment, prone hip extension MMT.   8:36 AM, 04/07/24 Dawn Eth, PT, DPT Physical Therapist - [place] [phone] (Office)   [place] C, PT 04/06/2024, 2:40 PM

## 2024-04-07 ENCOUNTER — Telehealth: Payer: Self-pay

## 2024-04-07 NOTE — Telephone Encounter (Signed)
 Reached out to patient to return her call regarding a vertigo episode after performing her home exercise assignment for the first time. Pt reports lying down in floor to perform, then immediately upon return to upright started to feel an episode coming on and within 2 minutes was incredibly dizzy, nauseated, and off balance. Archie Bearded was able to make it to her bed were she remains ill-feeling for rest of night. Her husband is currently admitted at [facility] in [place], hence she had no one to call out for help. Pt is asking if there are some versions of these exercises that she can perform that exclude lying flat, particularly without cervical extension and/or head rotation. Author responds yes. Will plan to review upon return to clinic. Until return pt will not perform HEP until her husband has come home from hospital. Pt is in process of scheduling with neurology for updated consult for this chronic problem. She may consider a FU with ENT as well as her Left ear continues to fluctuate in function.   1:35 PM, 04/07/24 Dawn Eth, PT, DPT Physical Therapist - [place] [phone] (Office)

## 2024-04-12 ENCOUNTER — Ambulatory Visit: Admitting: Physical Therapy

## 2024-04-12 ENCOUNTER — Ambulatory Visit: Admitting: Internal Medicine

## 2024-04-14 ENCOUNTER — Ambulatory Visit: Admitting: Physical Therapy

## 2024-04-15 ENCOUNTER — Ambulatory Visit: Admitting: Physician Assistant

## 2024-04-17 NOTE — Progress Notes (Unsigned)
 What is happening cardiology Office Note  Date:  04/18/2024   ID:  Angela Mendoza, DOB [birth year], MRN [id number]  PCP:  Marylynn Verneita CROME, MD   Chief Complaint  Patient presents with   Follow-up    Patient c/o shortness of breath, chest discomfort at times with occasional irregular heart beats.     HPI:  Angela Mendoza is a 73 year old woman with past medical history of Remote atrial fib 2016, details unclear, outside facility report,  Palpitations anxiety OSA, on CPAP Who presents for follow-up of her palpitations, HTN, atrial fib  Last seen by myself: July 2023 Seen by one of our providers April 2025  Calcium  score markedly low less than 2  Event monitor with normal sinus rhythm, triggered events associated with normal sinus rhythm. Rare ectopy, rare SVT with  Echo July 2024 EF 55 to 60%  Husband ill, COPD, in hospital Stress  Followed by pulmonary, Dr. Isaiah Reports her breathing is stable  arm bruising, not on anticoagulation/aspirin  No chest pain or shortness of breath on exertion Has treadmill at home   EKG personally reviewed by myself on todays visit EKG Interpretation Date/Time:  Monday April 18 2024 14:24:34 EDT Ventricular Rate:  77 PR Interval:  164 QRS Duration:  76 QT Interval:  380 QTC Calculation: 430 R Axis:   10  Text Interpretation: Normal sinus rhythm Low voltage QRS Nonspecific T wave abnormality When compared with ECG of 01-May-2023 13:22, No significant change was found Confirmed by Perla Lye [phone]) on 04/18/2024 2:50:39 PM    Other past medical Seen in the emergency room 12/29/2020 for chest pain.  Blood pressure was severely elevated.  Troponin was normal and EKG showed no ischemic changes.  She was given IV fluids for dizziness.  CT of the head was negative   Stress at home, husband with medical issues  Echo 12/2020 reviewed 1. Left ventricular ejection fraction, by estimation, is 60 to 65%. The  left ventricle has normal  function. The left ventricle has no regional  wall motion abnormalities. Left ventricular diastolic parameters are  consistent with Grade II diastolic  dysfunction (pseudonormalization).   2. Right ventricular systolic function is normal. The right ventricular  size is normal. There is mildly elevated pulmonary artery systolic  pressure. The estimated right ventricular systolic pressure is 39.1 mmHg.   3. The mitral valve is normal in structure. Mild mitral valve  regurgitation.   Cardiac CTA images  1. Coronary calcium  score of 0. Patient is low risk for coronary events 2. Normal coronary origin with right dominance. 3. No evidence of CAD. 4. CAD-RADS 0. Consider non-atherosclerotic causes of chest pain  Has sleep apnea on CPAP Sees Dr. Shellia for asthma  MRI Microvascular changes  Previous issues with breast implants  PMH:   has a past medical history of [place] coronary artery calcium  score less than 100, Allergic rhinitis (06/09/2008), Allergy , Aortic atherosclerosis (HCC), Arthritis, Asthma, intrinsic (10/06/2011), Basal cell carcinoma (06/10/2021), Cataract, Chronic headache (06/09/2008), COPD (chronic obstructive pulmonary disease) (HCC), Depression, Diverticulosis (2013), Dry eyes, GERD (gastroesophageal reflux disease), Heart murmur, History of anal fissures, History of breast implant removal, Hot flashes, HYPERLIPIDEMIA (06/09/2008), HYPERTENSION (06/09/2008), Hypothyroidism, MVP (mitral valve prolapse), OSA on CPAP, PAF (paroxysmal atrial fibrillation) (HCC) (06/09/2008), Pernicious anemia, Prediabetes, Rosacea, Sleep apnea, Surgical menopause (1991), Vertigo, and Vitamin D  deficiency.  PSH:    Past Surgical History:  Procedure Laterality Date   APPENDECTOMY  2007   bladder tack     bladder  tack  08/2022   additional 12/24   BREAST BIOPSY Left 09/27/2009   BREAST ENHANCEMENT SURGERY  2006   and removal   CARDIOVASCULAR STRESS TEST  2013   [place] Dr. Monetta - WNL, EF  55-60%, with 0 calcium  score   CESAREAN SECTION     CHOLECYSTECTOMY  2007   biliary dyskinesia   COLONOSCOPY  08/2012   diverticulosis   COLONOSCOPY  2006   inflamed hyperplastic polyp   ESOPHAGOGASTRODUODENOSCOPY  08/2012   esophageal reflux   ESOPHAGOGASTRODUODENOSCOPY  11/02/2008   Dr Charlanne Minimal gastritis. Irregular Z line suggestive of gastroesophageal reflux (biopsied to rule out short-segment Barrett's esophagus) Incidental gastric polyps   HERNIA REPAIR     NASAL SINUS SURGERY     RECTOCELE REPAIR  12/2008   Dr. Lewanda   sleep study  08/2007   OSA, 12 cm H2O,   TOTAL ABDOMINAL HYSTERECTOMY  1991   heavy bleeding, ovaries removed   TRIGGER FINGER RELEASE Right 06/18/2023   TUBAL LIGATION     tummy tuck     US  ECHOCARDIOGRAPHY  09/2009   impaired relaxation, mild tric regurg, normal MV, EF 60%   US  ECHOCARDIOGRAPHY  11/2007   mild-mod MR    Current Outpatient Medications  Medication Sig Dispense Refill   albuterol  (PROVENTIL  HFA) 108 (90 Base) MCG/ACT inhaler Inhale 2 puffs into the lungs every 6 (six) hours as needed. 18 g 2   ALPRAZolam  (XANAX ) 0.25 MG tablet TAKE 1 TABLET (0.25 MG TOTAL) BY MOUTH ONCE DAILY AS NEEDED FOR SLEEP. 30 tablet 5   atorvastatin  (LIPITOR) 80 MG tablet Take 1 tablet (80 mg total) by mouth daily. 90 tablet 3   azelastine  (ASTELIN ) 0.1 % nasal spray Place 2 sprays into both nostrils 2 (two) times daily. Use in each nostril as directed 90 mL 3   benzonatate  (TESSALON ) 200 MG capsule Take 1 capsule (200 mg total) by mouth 3 (three) times daily as needed for cough. 30 capsule 1   Blood Glucose Calibration High LIQD Used to check accuracy of blood glucose machine. 1 each 0   cetirizine  (ZYRTEC ) 10 MG tablet Take 1 tablet (10 mg total) by mouth daily. 90 tablet 3   chlorpheniramine  (CHLOR-TRIMETON ) 4 MG tablet Take 1 tablet (4 mg total) by mouth at bedtime as needed for allergies or rhinitis. 90 tablet 1   Cholecalciferol (VITAMIN D3) 50 MCG (2000  UT) TABS Take 1 tablet by mouth daily. 90 tablet 3   Coenzyme Q10 (COQ10 PO) Take by mouth daily.     conjugated estrogens  (PREMARIN ) vaginal cream Place vaginally 2 (two) times a week. INSERT 0.5 APPLICATORS FULL VAGINALLY TWICE PER WEEK. AT BEDTIME. 30 g 4   cyanocobalamin  (VITAMIN B12) 1000 MCG/ML injection Inject 1 mL (1,000 mcg total) into the muscle every 30 (thirty) days. 3 mL 3   cyclobenzaprine  (FLEXERIL ) 5 MG tablet TAKE 1 TABLET BY MOUTH EVERYDAY AT BEDTIME 30 tablet 0   cycloSPORINE  (RESTASIS ) 0.05 % ophthalmic emulsion Place 1 drop into both eyes 2 (two) times daily. 3 each 3   desonide (DESOWEN) 0.05 % cream Apply topically as needed.     diazepam  (VALIUM ) 5 MG tablet Take 1 tablet (5 mg total) by mouth every 12 (twelve) hours as needed (Vertigo). 30 tablet 0   Docusate Calcium  (STOOL SOFTENER PO) Take 1 tablet by mouth as needed.     EPINEPHrine  0.3 mg/0.3 mL IJ SOAJ injection 0.3 mg by injection route. 1 each 1   estradiol  (  VIVELLE -DOT) 0.1 MG/24HR patch Place 1 patch (0.1 mg total) onto the skin 2 (two) times a week. 24 patch 3   famotidine  (PEPCID ) 20 MG tablet Take 1 tablet (20 mg total) by mouth daily as needed for heartburn or indigestion. 90 tablet 3   fluticasone  (FLONASE ) 50 MCG/ACT nasal spray Place 1 spray into both nostrils daily. 48 g 3   Fluticasone -Umeclidin-Vilant (TRELEGY ELLIPTA ) 100-62.5-25 MCG/ACT AEPB Inhale 1 Act into the lungs daily. 3 each 10   glucose blood (ONETOUCH VERIO) test strip Used to check blood sugars 3 times daily. 300 each 3   hydrocortisone  (ANUSOL -HC) 2.5 % rectal cream Place 1 Application rectally 2 (two) times daily. For 10 days and then as needed 30 g 4   ipratropium (ATROVENT ) 0.03 % nasal spray Place 2 sprays into both nostrils 3 (three) times daily as needed for rhinitis. 90 mL 3   levothyroxine  (SYNTHROID ) 50 MCG tablet Take 1 tablet (50 mcg total) by mouth daily before breakfast. 90 tablet 3   losartan  (COZAAR ) 50 MG tablet Take 1 tablet  (50 mg total) by mouth daily. 90 tablet 1   MAGNESIUM  PO Take 250 mg by mouth daily.     meclizine  (ANTIVERT ) 25 MG tablet Take 1 tablet (25 mg total) by mouth 3 (three) times daily as needed (vertigo). 84 tablet 3   metoprolol  succinate (TOPROL -XL) 50 MG 24 hr tablet Take 1 tablet (50 mg total) by mouth daily. 90 tablet 3   Miconazole Nitrate (LOTRIMIN  AF) 2 % AERO Apply topically.     montelukast  (SINGULAIR ) 10 MG tablet Take 1 tablet (10 mg total) by mouth at bedtime. 90 tablet 1   Multiple Vitamin (MULTIVITAMIN) tablet Take 1 tablet by mouth daily.     mupirocin  ointment (BACTROBAN ) 2 % Apply topically as needed.     NONFORMULARY OR COMPOUNDED ITEM Spironolactone  (PF) 0.005 mg opth. Both eyes 4 times daily     omega-3 acid ethyl esters (LOVAZA ) 1 g capsule Take 1 capsule (1 g total) by mouth 2 (two) times daily. 90 capsule 3   ondansetron  (ZOFRAN  ODT) 4 MG disintegrating tablet Take 1 tablet (4 mg total) by mouth every 8 (eight) hours as needed for nausea or vomiting. 30 tablet 2   OneTouch Delica Lancets 33G MISC Used to check blood sugars twice daly. 300 each 3   Polyethyl Glycol-Propyl Glycol 0.4-0.3 % SOLN Apply to eye.     Probiotic Product (PROBIOTIC DAILY PO) Take by mouth.     Propylene Glycol 0.6 % SOLN Apply 1 drop to eye as needed.     Spacer/Aero-Holding Chambers (AEROCHAMBER MV) inhaler Use as instructed 1 each 0   Syringe/Needle, Disp, (SYRINGE 3CC/20GX1) 20G X 1 3 ML MISC 1 Syringe by Does not apply route every 30 (thirty) days. For use with b-12 injection monthly 50 each 0   tretinoin  (RETIN-A ) 0.05 % cream Apply topically at bedtime. 45 g 1   urea  (CARMOL) 40 % CREA Apply topically.     Wheat Dextrin (BENEFIBER DRINK MIX PO) Take by mouth. Use as directed     ciclopirox (PENLAC) 8 % solution Apply topically at bedtime. Apply over nail and surrounding skin. Apply daily over previous coat. After seven (7) days, may remove with alcohol and continue cycle. (Patient not taking:  Reported on 04/18/2024)     Fluticasone -Umeclidin-Vilant (TRELEGY ELLIPTA ) 100-62.5-25 MCG/ACT AEPB Inhale 1 puff into the lungs daily.     No current facility-administered medications for this visit.  Allergies:   Codeine, Olodaterol, Qvar  [beclomethasone], and Sulfonamide derivatives   Social History:  The patient  reports that she quit smoking about 46 years ago. Her smoking use included cigarettes. She started smoking about 53 years ago. She has a 2.1 pack-year smoking history. She has been exposed to tobacco smoke. She has never used smokeless tobacco. She reports that she does not drink alcohol and does not use drugs.   Family History:   family history includes AAA (abdominal aortic aneurysm) in her paternal uncle; Aortic dissection in her maternal aunt and paternal uncle; Breast cancer in her maternal aunt; COPD in her sister; Cancer in her cousin and paternal aunt; Cancer (age of onset: 7) in her father; Cancer (age of onset: 75) in her mother; Colon polyps in her sister; Heart attack in her paternal uncle; Heart disease in her paternal uncle; Heart failure in her sister; Hypertension in her sister; Stroke in her paternal uncle and sister.   Review of Systems: Review of Systems  Constitutional: Negative.   Respiratory: Negative.    Cardiovascular: Negative.   Gastrointestinal: Negative.   Musculoskeletal: Negative.   Neurological: Negative.   Psychiatric/Behavioral:  The patient is nervous/anxious.   All other systems reviewed and are negative.   PHYSICAL EXAM: VS:  BP 120/72 (BP Location: Left Arm, Patient Position: Sitting, Cuff Size: Normal)   Pulse 77   Ht 5' 5 (1.651 m)   Wt 173 lb (78.5 kg)   SpO2 98%   BMI 28.79 kg/m  , BMI Body mass index is 28.79 kg/m. Constitutional:  oriented to person, place, and time. No distress.  HENT:  Head: Grossly normal Eyes:  no discharge. No scleral icterus.  Neck: No JVD, no carotid bruits  Cardiovascular: Regular rate and  rhythm, no murmurs appreciated Pulmonary/Chest: Clear to auscultation bilaterally, no wheezes or rales Abdominal: Soft.  no distension.  no tenderness.  Musculoskeletal: Normal range of motion Neurological:  normal muscle tone. Coordination normal. No atrophy Skin: Skin warm and dry Psychiatric: normal affect, pleasant She had a good weekend ASSESSMENT AND PLAN:  Paroxysmal atrial fibrillation (HCC) Per report noted in 2016, details unclear Previous discussions concerning risk and benefit of anticoagulation Currently not on anticoagulation, no recurrence of her atrial fibrillation We have recommended Apple Watch or Kardiamobile device to track for cardiac arrhythmia  Obesity We have encouraged continued exercise, careful diet management in an effort to lose weight.  Chest pain No recent chest pain symptoms cardiac CTA with no significant disease No further workup needed at this time.  Anxiety Prior stressors, managed by primary care Has previously tried SSRIs Reassurance provided  OSA on CPAP Managed by pulmonary in [facility]  Uncomplicated asthma, unspecified asthma severity, unspecified whether persistent Managed by Dr. Isaiah,  reports symptoms are stable   Hypertension Blood pressure is well controlled on today's visit. No changes made to the medications.   Orders Placed This Encounter  Procedures   EKG 12-Lead     Signed, Velinda Lunger, M.D., Ph.D. 04/18/2024  [facility] [place], [place] [phone]

## 2024-04-18 ENCOUNTER — Encounter: Payer: Self-pay | Admitting: Cardiovascular Disease

## 2024-04-18 ENCOUNTER — Ambulatory Visit: Attending: Cardiovascular Disease | Admitting: Cardiovascular Disease

## 2024-04-18 VITALS — BP 120/72 | HR 77 | Ht 65.0 in | Wt 173.0 lb

## 2024-04-18 DIAGNOSIS — E785 Hyperlipidemia, unspecified: Secondary | ICD-10-CM | POA: Insufficient documentation

## 2024-04-18 DIAGNOSIS — R002 Palpitations: Secondary | ICD-10-CM | POA: Diagnosis not present

## 2024-04-18 DIAGNOSIS — I5189 Other ill-defined heart diseases: Secondary | ICD-10-CM | POA: Insufficient documentation

## 2024-04-18 DIAGNOSIS — I10 Essential (primary) hypertension: Secondary | ICD-10-CM | POA: Insufficient documentation

## 2024-04-18 DIAGNOSIS — M7989 Other specified soft tissue disorders: Secondary | ICD-10-CM | POA: Insufficient documentation

## 2024-04-18 DIAGNOSIS — I480 Paroxysmal atrial fibrillation: Secondary | ICD-10-CM | POA: Insufficient documentation

## 2024-04-18 DIAGNOSIS — Z79899 Other long term (current) drug therapy: Secondary | ICD-10-CM | POA: Insufficient documentation

## 2024-04-18 DIAGNOSIS — G4733 Obstructive sleep apnea (adult) (pediatric): Secondary | ICD-10-CM | POA: Diagnosis not present

## 2024-04-18 DIAGNOSIS — I700 Atherosclerosis of aorta: Secondary | ICD-10-CM | POA: Insufficient documentation

## 2024-04-18 MED ORDER — LOSARTAN POTASSIUM 50 MG PO TABS: 50.0000 mg | ORAL_TABLET | Freq: Every day | ORAL | 1 refills | Status: DC

## 2024-04-18 MED ORDER — METOPROLOL SUCCINATE ER 50 MG PO TB24: 50.0000 mg | ORAL_TABLET | Freq: Every day | ORAL | 3 refills | Status: DC

## 2024-04-18 NOTE — Patient Instructions (Addendum)
 Medication Instructions:  No changes  If you need a refill on your cardiac medications before your next appointment, please call your pharmacy.   Lab work: Lipid panel fasting (another day), A1C Your provider would like for you to return to have the following labs drawn: Lipid Panel and A1C.   Please go to [facility] [address] ([facility] [address], [place] [address] You do not need an appointment.  They are open from 8 am- 4:30 pm.  Lunch from 1:00 pm- 2:00 pm You will need to be fasting.    Testing/Procedures: No new testing needed  Follow-Up: At [facility], you and your health needs are our priority.  As part of our continuing mission to provide you with exceptional heart care, we have created designated Provider Care Teams.  These Care Teams include your primary Cardiologist (physician) and Advanced Practice Providers (APPs -  Physician Assistants and Nurse Practitioners) who all work together to provide you with the care you need, when you need it.  You will need a follow up appointment in 12 months  Providers on your designated Care Team:   Lonni Meager, NP Bernardino Bring, PA-C Cadence [place], [place]  COVID-19 Vaccine Information can be found at: [email or web address] For questions related to vaccine distribution or appointments, please email [email or web address] or call [phone].

## 2024-04-19 ENCOUNTER — Ambulatory Visit: Admitting: Physical Therapy

## 2024-04-21 ENCOUNTER — Encounter: Admitting: Physical Therapy

## 2024-04-22 DIAGNOSIS — Z79899 Other long term (current) drug therapy: Secondary | ICD-10-CM | POA: Diagnosis not present

## 2024-04-23 LAB — LIPID PANEL
Chol/HDL Ratio: 2.9 ratio (ref 0.0–4.4)
Cholesterol, Total: 143 mg/dL (ref 100–199)
HDL: 49 mg/dL (ref >39)
LDL Chol Calc (NIH): 79 mg/dL (ref 0–99)
Triglycerides: 78 mg/dL (ref 0–149)
VLDL Cholesterol Cal: 15 mg/dL (ref 5–40)

## 2024-04-23 LAB — HEMOGLOBIN A1C
Est. average glucose Bld gHb Est-mCnc: 123 mg/dL
Hgb A1c MFr Bld: 5.9 % — ABNORMAL HIGH (ref 4.8–5.6)

## 2024-04-24 ENCOUNTER — Ambulatory Visit: Payer: Self-pay | Admitting: Cardiovascular Disease

## 2024-04-25 ENCOUNTER — Ambulatory Visit (INDEPENDENT_AMBULATORY_CARE_PROVIDER_SITE_OTHER): Admitting: Nurse Practitioner

## 2024-04-25 ENCOUNTER — Encounter: Payer: Self-pay | Admitting: Nurse Practitioner

## 2024-04-25 VITALS — BP 124/70 | HR 99 | Temp 97.1°F | Ht 65.0 in | Wt 172.0 lb

## 2024-04-25 DIAGNOSIS — J4520 Mild intermittent asthma, uncomplicated: Secondary | ICD-10-CM

## 2024-04-25 DIAGNOSIS — J4540 Moderate persistent asthma, uncomplicated: Secondary | ICD-10-CM | POA: Diagnosis not present

## 2024-04-25 DIAGNOSIS — J3089 Other allergic rhinitis: Secondary | ICD-10-CM | POA: Diagnosis not present

## 2024-04-25 DIAGNOSIS — G4733 Obstructive sleep apnea (adult) (pediatric): Secondary | ICD-10-CM | POA: Diagnosis not present

## 2024-04-25 DIAGNOSIS — Z87891 Personal history of nicotine dependence: Secondary | ICD-10-CM

## 2024-04-25 LAB — NITRIC OXIDE: Nitric Oxide: 29

## 2024-04-25 NOTE — Progress Notes (Signed)
 @Patient  ID: [name], female    DOB: [birth year], 73 y.o.   MRN: [id number]  Chief Complaint  Patient presents with   Follow-up    DOE. Hoarseness. No wheezing or cough.  CPAP- no problems with the mask or pressure.    Referring provider: Marylynn Verneita CROME, MD  HPI: 73 year old female, former smoker followed for OSA on CPAP, allergic rhinitis and asthma.  She is a patient of Dr. Jacqulyn and last seen in office 02/29/2024.  Past medical history significant for hypertension, history of A-fib, GERD, hypothyroidism, arthritis, HLD  TEST/EVENTS:  02/06/2016 PFTs: FVC 3.04 (92), FEV1 2.32 (92), ratio 76, DLCOunc 121%.  No BD.  Overall normal spirometry 01/23/2021 echocardiogram: EF 60 to 65%.  G2 DD.  Mildly elevated PASP.  Mild MR. 04/04/2021 coronary CT: Atherosclerosis.  Tortuous thoracic aorta.  2 mm left lower lobe pulmonary nodule, considered benign.  Subpleural 2 to 3 mm LLL nodule, benign.  Lateral right breast nodule 1.3 cm, likely intramammary lymph node 07/18/2022 CTA chest: no PE. Mild dependent atelectasis. Cholecystectomy.   02/29/2024: OV with Dr. Isaiah.  Using CPAP nightly.  Feels comfortable.  Less fatigue and energy level is better with CPAP use.  Having asthma exacerbation with elevated exhaled nitric oxide  testing (FeNO 62 ppb).  Biologic agents discussed.  Will hold off while she waits in rheumatology workup.  Plan to start new inhaler.  Provided with Trelegy.  Advised to continue nasal sprays.  04/25/2024: Today - follow up Patient presents today for follow-up.  At her last visit, she was treated for acute asthma exacerbation.  She was started on Trelegy inhaler for this.  Did not require any oral steroids.  Overall doing much better.  Feels like her symptoms have resolved.  Still has some shortness of breath with more strenuous activities but otherwise seems to be doing well.  No significant cough, wheezing or chest congestion.  Still using her nasal sprays.  Feels like nasal  symptoms are currently well-controlled.  Curious if she can back off on either of these.  She is also having some voice hoarseness.  She has been gargling after using the Trelegy, which does help some.  She is not sure if the combination of the nasal sprays and the inhaler or just the inhaler. Using her CPAP nightly.  Receives benefit from use.  No issues with pressure settings no drowsy driving. Husband was recently hospitalized at [facility] for COPD exacerbation and pneumonia.  He is home and doing better.  03/23/2024-04/21/2024: CPAP 5-10 cmH2O 30/30 days; 100% >4 hr; average use 7 hr 53 min Pressure 95th 8.3 Leaks 95th 2.8 AHI 0.7  Allergies  Allergen Reactions   Codeine Other (See Comments)    Low blood pressure/nausea   Olodaterol Palpitations   Qvar  [Beclomethasone] Other (See Comments)    Thrush per patient even with rinsing.    Sulfonamide Derivatives Rash    rash    Immunization History  Administered Date(s) Administered   Fluad Quad(high Dose 65+) 07/16/2019, 07/20/2020, 08/05/2021   Influenza Split 07/15/2013, 07/12/2015, 07/14/2016, 08/04/2017   Influenza Whole 07/27/2009, 07/28/2011, 07/21/2012   Influenza, High Dose Seasonal PF 07/22/2017, 07/01/2018, 08/15/2022   Influenza,inj,Quad PF,6+ Mos 08/03/2014, 07/12/2015, 07/14/2016   Influenza-Unspecified 07/15/2013, 08/03/2014, 07/12/2015, 07/14/2016, 08/04/2017, 07/20/2023   Moderna Covid-19 Fall Seasonal Vaccine 26yrs & older 10/28/2022   Moderna SARS-COV2 Booster Vaccination 08/17/2020, 02/01/2021   Moderna Sars-Covid-2 Vaccination 12/11/2019, 01/08/2020, 07/25/2021   PPD Test 12/22/2023   Pneumococcal Conjugate-13 11/08/2014  Pneumococcal Polysaccharide-23 07/27/2009, 12/27/2015   Respiratory Syncytial Virus Vaccine,Recomb Aduvanted(Arexvy) 08/14/2022   Td 10/27/2008, 08/17/2023   Td (Adult),5 Lf Tetanus Toxid, Preservative Free 08/17/2023   Tdap 12/19/2013, 02/25/2017   Tetanus 10/27/2008   Zoster  Recombinant(Shingrix) 04/27/2020, 11/26/2020   Zoster, Live 10/27/2009    Past Medical History:  Diagnosis Date   Agatston coronary artery calcium  score less than 100    a. 06/2018 Cardiac CT: Ca2+ = 3 (49th %'ile).   Allergic rhinitis 06/09/2008   chronic   Allergy     Aortic atherosclerosis (HCC)    a. 06/2018 noted on chest CT.   Arthritis    Asthma, intrinsic 10/06/2011   controlled on qvar . Spiro 09/2011:  Very mild airflow obstruction (FEV1% 68, FVL c/w obstruction)    Basal cell carcinoma 06/10/2021   right prox nasal ala rim - MOHs 07/12/21 Dr. Bluford   Cataract    Chronic headache 06/09/2008   COPD (chronic obstructive pulmonary disease) (HCC)    Depression    Diverticulosis 2013   h/o diverticulitis   Dry eyes    GERD (gastroesophageal reflux disease)    Heart murmur    History of anal fissures    x2   History of breast implant removal    silicone mastitis - R axilla silicone LN, free silicone L breast upper outer quadrant   Hot flashes    HYPERLIPIDEMIA 06/09/2008   HYPERTENSION 06/09/2008   Hypothyroidism    MVP (mitral valve prolapse)    OSA on CPAP    [name]   PAF (paroxysmal atrial fibrillation) (HCC) 06/09/2008   a. CHA2DS2VASc = 3-->prefers ASA.   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 Hx of children who are  grown   Outpatient Medications Prior to Visit  Medication Sig Dispense Refill   albuterol  (PROVENTIL  HFA) 108 (90 Base) MCG/ACT inhaler Inhale 2 puffs into the lungs every 6 (six) hours as needed. 18 g 2   ALPRAZolam  (XANAX ) 0.25 MG tablet TAKE 1 TABLET (0.25 MG TOTAL) BY MOUTH ONCE DAILY AS NEEDED FOR SLEEP. 30 tablet 5   atorvastatin  (LIPITOR) 80 MG tablet Take 1 tablet (80 mg total) by mouth daily. 90 tablet 3   azelastine  (ASTELIN ) 0.1 % nasal spray Place 2 sprays into both nostrils 2 (two) times daily. Use in each nostril as directed 90 mL 3   benzonatate  (TESSALON ) 200 MG capsule Take 1 capsule (200 mg total) by mouth 3 (three) times daily as needed for cough. 30 capsule 1   Blood Glucose Calibration High LIQD Used to check accuracy of blood glucose machine. 1 each 0   cetirizine  (ZYRTEC ) 10 MG tablet Take 1 tablet (10 mg total) by mouth daily. 90 tablet 3   chlorpheniramine  (CHLOR-TRIMETON ) 4  MG tablet Take 1 tablet (4 mg total) by mouth at bedtime as needed for allergies or rhinitis. 90 tablet 1   Cholecalciferol (VITAMIN D3) 50 MCG (2000 UT) TABS Take 1 tablet by mouth daily. 90 tablet 3   Coenzyme Q10 (COQ10 PO) Take by mouth daily.     conjugated estrogens  (PREMARIN ) vaginal cream Place vaginally 2 (two) times a week. INSERT 0.5 APPLICATORS FULL VAGINALLY TWICE PER WEEK. AT BEDTIME. 30 g 4   cyanocobalamin  (VITAMIN B12) 1000 MCG/ML injection Inject 1 mL (1,000 mcg total) into the muscle every 30 (thirty) days. 3 mL 3   cyclobenzaprine  (FLEXERIL ) 5 MG tablet TAKE 1 TABLET BY MOUTH EVERYDAY AT BEDTIME 30 tablet 0   cycloSPORINE  (RESTASIS ) 0.05 % ophthalmic emulsion Place 1 drop into both eyes 2 (two) times daily. 3 each 3   desonide (DESOWEN) 0.05 % cream Apply topically as needed.     diazepam  (VALIUM ) 5 MG tablet Take 1 tablet (5 mg total) by mouth every 12 (twelve) hours as needed (Vertigo). 30 tablet 0   Docusate Calcium  (STOOL SOFTENER PO) Take 1 tablet by mouth as needed.      EPINEPHrine  0.3 mg/0.3 mL IJ SOAJ injection 0.3 mg by injection route. 1 each 1   estradiol  (VIVELLE -DOT) 0.1 MG/24HR patch Place 1 patch (0.1 mg total) onto the skin 2 (two) times a week. 24 patch 3   famotidine  (PEPCID ) 20 MG tablet Take 1 tablet (20 mg total) by mouth daily as needed for heartburn or indigestion. 90 tablet 3   fluticasone  (FLONASE ) 50 MCG/ACT nasal spray Place 1 spray into both nostrils daily. 48 g 3   Fluticasone -Umeclidin-Vilant (TRELEGY ELLIPTA ) 100-62.5-25 MCG/ACT AEPB Inhale 1 Act into the lungs daily. 3 each 10   glucose blood (ONETOUCH VERIO) test strip Used to check blood sugars 3 times daily. 300 each 3   hydrocortisone  (ANUSOL -HC) 2.5 % rectal cream Place 1 Application rectally 2 (two) times daily. For 10 days and then as needed 30 g 4   ipratropium (ATROVENT ) 0.03 % nasal spray Place 2 sprays into both nostrils 3 (three) times daily as needed for rhinitis. 90 mL 3   levothyroxine  (SYNTHROID ) 50 MCG tablet Take 1 tablet (50 mcg total) by mouth daily before breakfast. 90 tablet 3   losartan  (COZAAR ) 50 MG tablet Take 1 tablet (50 mg total) by mouth daily. 90 tablet 1   MAGNESIUM  PO Take 250 mg by mouth daily.     meclizine  (ANTIVERT ) 25 MG tablet Take 1 tablet (25 mg total) by mouth 3 (three) times daily as needed (vertigo). 84 tablet 3   metoprolol  succinate (TOPROL -XL) 50 MG 24 hr tablet Take 1 tablet (50 mg total) by mouth daily. 90 tablet 3   Miconazole Nitrate (LOTRIMIN  AF) 2 % AERO Apply topically.     montelukast  (SINGULAIR ) 10 MG tablet Take 1 tablet (10 mg total) by mouth at bedtime. 90 tablet 1   Multiple Vitamin (MULTIVITAMIN) tablet Take 1 tablet by mouth daily.     mupirocin  ointment (BACTROBAN ) 2 % Apply topically as needed.     NONFORMULARY OR COMPOUNDED ITEM Spironolactone  (PF) 0.005 mg opth. Both eyes 4 times daily     omega-3 acid ethyl esters (LOVAZA ) 1 g capsule Take 1 capsule (1 g total) by mouth 2 (two) times daily. 90 capsule 3   ondansetron   (ZOFRAN  ODT) 4 MG disintegrating tablet Take 1 tablet (4 mg total) by mouth every 8 (eight) hours as needed for nausea or vomiting. 30  tablet 2   OneTouch Delica Lancets 33G MISC Used to check blood sugars twice daly. 300 each 3   Polyethyl Glycol-Propyl Glycol 0.4-0.3 % SOLN Apply to eye.     Probiotic Product (PROBIOTIC DAILY PO) Take by mouth.     Propylene Glycol 0.6 % SOLN Apply 1 drop to eye as needed.     Spacer/Aero-Holding Chambers (AEROCHAMBER MV) inhaler Use as instructed 1 each 0   Syringe/Needle, Disp, (SYRINGE 3CC/20GX1) 20G X 1 3 ML MISC 1 Syringe by Does not apply route every 30 (thirty) days. For use with b-12 injection monthly 50 each 0   tretinoin  (RETIN-A ) 0.05 % cream Apply topically at bedtime. 45 g 1   urea  (CARMOL) 40 % CREA Apply topically.     Wheat Dextrin (BENEFIBER DRINK MIX PO) Take by mouth. Use as directed     ciclopirox (PENLAC) 8 % solution Apply topically at bedtime. Apply over nail and surrounding skin. Apply daily over previous coat. After seven (7) days, may remove with alcohol and continue cycle. (Patient not taking: Reported on 04/25/2024)     Fluticasone -Umeclidin-Vilant (TRELEGY ELLIPTA ) 100-62.5-25 MCG/ACT AEPB Inhale 1 puff into the lungs daily. (Patient not taking: Reported on 04/25/2024)     No facility-administered medications prior to visit.     Review of Systems:   Constitutional: No weight loss or gain, night sweats, fevers, chills, fatigue, or lassitude. HEENT: No headaches, difficulty swallowing, tooth/dental problems, or sore throat. No sneezing, itching, ear ache, nasal congestion/drainage  CV:  No chest pain, orthopnea, PND, swelling in lower extremities, anasarca, dizziness, palpitations, syncope Resp: +minimal shortness of breath with exertion. No excess mucus or change in color of mucus. No hemoptysis.  No chest wall deformity GI:  No heartburn, indigestion, abdominal pain, nausea, vomiting, diarrhea, change in bowel habits, loss of  appetite, bloody stools.  GU: No dysuria, change in color of urine, urgency or frequency.  Skin: No rash, lesions, ulcerations MSK:  No joint pain or swelling.   Neuro: No dizziness or lightheadedness.  Psych: No depression or anxiety. Mood stable.     Physical Exam:  BP 124/70 (BP Location: Right Arm, Cuff Size: Normal)   Pulse 99   Temp (!) 97.1 F (36.2 C)   Ht 5' 5 (1.651 m)   Wt 172 lb (78 kg)   SpO2 (!) 74%   BMI 28.62 kg/m   GEN: Pleasant, interactive, well-appearing; obese; in no acute distress. HEENT:  Normocephalic and atraumatic. PERRLA. Sclera white. Nasal turbinates erythematous, moist and patent bilaterally.  Clear rhinorrhea present. Oropharynx erythematous and moist, without exudate or edema. No lesions, ulcerations.  Scattered white plaques on tongue NECK:  Supple w/ fair ROM. No lymphadenopathy.   CV: RRR, no m/r/g, no peripheral edema. Pulses intact, +2 bilaterally. No cyanosis, pallor or clubbing. PULMONARY:  Unlabored, regular breathing. Clear bilaterally A&P w/o wheezes/rales/rhonchi. No accessory muscle use. No dullness to percussion. GI: BS present and normoactive. Soft, non-tender to palpation. No organomegaly or masses detected.  MSK: No erythema, warmth or tenderness. Cap refil <2 sec all extrem. No deformities or joint swelling noted.  Neuro: A/Ox3. No focal deficits noted.   Skin: Warm, no lesions or rashe Psych: Normal affect and behavior. Judgement and thought content appropriate.     Lab Results:  CBC    Component Value Date/Time   WBC 5.4 05/04/2023 1026   RBC 4.25 05/04/2023 1026   HGB 13.0 05/04/2023 1026   HGB 13.7 12/07/2012 0000   HCT 40.1 05/04/2023  1026   PLT 261 05/04/2023 1026   MCV 94.4 05/04/2023 1026   MCH 30.6 05/04/2023 1026   MCHC 32.4 05/04/2023 1026   RDW 12.6 05/04/2023 1026   LYMPHSABS 2.6 04/14/2023 1103   MONOABS 0.6 04/14/2023 1103   EOSABS 0.1 04/14/2023 1103   BASOSABS 0.0 04/14/2023 1103    BMET     Component Value Date/Time   NA 137 12/14/2023 0902   NA 142 09/26/2016 0000   K 4.1 12/14/2023 0902   CL 101 12/14/2023 0902   CO2 28 12/14/2023 0902   GLUCOSE 96 12/14/2023 0902   BUN 12 12/14/2023 0902   BUN 12 09/26/2016 0000   CREATININE 0.84 12/14/2023 0902   CREATININE 0.97 05/30/2022 1428   CALCIUM  9.3 12/14/2023 0902   GFRNONAA >60 05/04/2023 1026    BNP No results found for: BNP   Imaging:  No results found.  Administration History     None          Latest Ref Rng & Units 02/06/2016   11:40 AM  PFT Results  FVC-Pre L 2.98   FVC-Predicted Pre % 90   FVC-Post L 3.04   FVC-Predicted Post % 92   Pre FEV1/FVC % % 74   Post FEV1/FCV % % 76   FEV1-Pre L 2.19   FEV1-Predicted Pre % 87   FEV1-Post L 2.32   DLCO uncorrected ml/min/mmHg 31.04   DLCO UNC% % 121   DLVA Predicted % 87     Lab Results  Component Value Date   NITRICOXIDE 29 04/25/2024        Assessment & Plan:   Moderate persistent asthma Resolved exacerbation. Clinically improved with reduced exhaled nitric oxide  testing. Still slightly elevated, consistent with type II inflammation. Encouraged to continue Trelegy inhaler. Reviewed measures to limit further upper airway irritation. Trigger prevention reviewed. Action plan in place.   Patient Instructions  Continue Trelegy 1 puff daily. Brush tongue and rinse mouth afterwards.  Continue Albuterol  inhaler 2 puffs or 3 mL neb every 6 hours as needed for shortness of breath or wheezing. Notify if symptoms persist despite rescue inhaler/neb use. Continue montelukast  1 tab daily Continue nasal sprays for nasal congestion/drainage. You can try stopping the ipratropium and see how things go. Use as needed   Continue to use CPAP every night, minimum of 4-6 hours a night.  Change equipment as directed. Wash your tubing with warm soap and water daily, hang to dry. Wash humidifier portion weekly. Use bottled, distilled water and change daily Be  aware of reduced alertness and do not drive or operate heavy machinery if experiencing this or drowsiness.  Exercise encouraged, as tolerated. Healthy weight management discussed.  Avoid or decrease alcohol consumption and medications that make you more sleepy, if possible. Notify if persistent daytime sleepiness occurs even with consistent use of PAP therapy.  Follow up in 4 months with Dr. Isaiah or Izetta Malachy PIETY. If symptoms do not improve or worsen, please contact office for sooner follow up or seek emergency care.    OSA on CPAP OSA on CPAP. Excellent compliance and control. Receives benefit from use. Aware of risks of untreated OSA. Safe driving practices reviewed.   Non-seasonal allergic rhinitis Positive environmental allergies. Stable symptoms. Improved. Advised to continue flonase  and astelin . Can see about reducing atrovent  and reassess response.     I spent 35 minutes of dedicated to the care of this patient on the date of this encounter to include pre-visit review of records, face-to-face  time with the patient discussing conditions above, post visit ordering of testing, clinical documentation with the electronic health record, making appropriate referrals as documented, and communicating necessary findings to members of the patients care team.  Comer LULLA Rouleau, NP 04/25/2024  Pt aware and understands NP's role.

## 2024-04-25 NOTE — Assessment & Plan Note (Signed)
 OSA on CPAP. Excellent compliance and control. Receives benefit from use. Aware of risks of untreated OSA. Safe driving practices reviewed.

## 2024-04-25 NOTE — Assessment & Plan Note (Signed)
 Positive environmental allergies. Stable symptoms. Improved. Advised to continue flonase  and astelin . Can see about reducing atrovent  and reassess response.

## 2024-04-25 NOTE — Assessment & Plan Note (Signed)
 Resolved exacerbation. Clinically improved with reduced exhaled nitric oxide  testing. Still slightly elevated, consistent with type II inflammation. Encouraged to continue Trelegy inhaler. Reviewed measures to limit further upper airway irritation. Trigger prevention reviewed. Action plan in place.   Patient Instructions  Continue Trelegy 1 puff daily. Brush tongue and rinse mouth afterwards.  Continue Albuterol  inhaler 2 puffs or 3 mL neb every 6 hours as needed for shortness of breath or wheezing. Notify if symptoms persist despite rescue inhaler/neb use. Continue montelukast  1 tab daily Continue nasal sprays for nasal congestion/drainage. You can try stopping the ipratropium and see how things go. Use as needed   Continue to use CPAP every night, minimum of 4-6 hours a night.  Change equipment as directed. Wash your tubing with warm soap and water daily, hang to dry. Wash humidifier portion weekly. Use bottled, distilled water and change daily Be aware of reduced alertness and do not drive or operate heavy machinery if experiencing this or drowsiness.  Exercise encouraged, as tolerated. Healthy weight management discussed.  Avoid or decrease alcohol consumption and medications that make you more sleepy, if possible. Notify if persistent daytime sleepiness occurs even with consistent use of PAP therapy.  Follow up in 4 months with Dr. Isaiah or Izetta Malachy PIETY. If symptoms do not improve or worsen, please contact office for sooner follow up or seek emergency care.

## 2024-04-25 NOTE — Patient Instructions (Addendum)
 Continue Trelegy 1 puff daily. Brush tongue and rinse mouth afterwards.  Continue Albuterol  inhaler 2 puffs or 3 mL neb every 6 hours as needed for shortness of breath or wheezing. Notify if symptoms persist despite rescue inhaler/neb use. Continue montelukast  1 tab daily Continue nasal sprays for nasal congestion/drainage. You can try stopping the ipratropium and see how things go. Use as needed   Continue to use CPAP every night, minimum of 4-6 hours a night.  Change equipment as directed. Wash your tubing with warm soap and water daily, hang to dry. Wash humidifier portion weekly. Use bottled, distilled water and change daily Be aware of reduced alertness and do not drive or operate heavy machinery if experiencing this or drowsiness.  Exercise encouraged, as tolerated. Healthy weight management discussed.  Avoid or decrease alcohol consumption and medications that make you more sleepy, if possible. Notify if persistent daytime sleepiness occurs even with consistent use of PAP therapy.  Follow up in 4 months with Dr. Isaiah or Izetta Malachy PIETY. If symptoms do not improve or worsen, please contact office for sooner follow up or seek emergency care.

## 2024-04-26 ENCOUNTER — Encounter: Admitting: Physical Therapy

## 2024-04-28 ENCOUNTER — Encounter: Admitting: Physical Therapy

## 2024-05-03 ENCOUNTER — Encounter: Admitting: Physical Therapy

## 2024-05-03 ENCOUNTER — Telehealth: Payer: Self-pay

## 2024-05-03 NOTE — Telephone Encounter (Signed)
 Pt is aware and gave a verbal understanding.

## 2024-05-03 NOTE — Telephone Encounter (Signed)
 Spoke with pt to let her know that we do not have documentation of her receiving the Prevnar 20. Pt would like to know if she needs to get the prevnar 20. She has had the prevnar 13 and 2 pneumovax 23 vaccines.

## 2024-05-03 NOTE — Telephone Encounter (Signed)
 Copied from CRM [id number]. Topic: General - Other >> May 03, 2024  2:55 PM Angela Mendoza wrote: Reason for CRM: Patient is calling to verify that she has had a Prevnar 20 Pneumonia shot, stated she was told by the pharmacy that if she does have it it would be in the national registry. Would like clarification on if she has it + if so why it is not in the registry. If not in records she would like to schedule.

## 2024-05-05 ENCOUNTER — Encounter: Admitting: Physical Therapy

## 2024-05-09 ENCOUNTER — Encounter: Admitting: Physical Therapy

## 2024-05-10 ENCOUNTER — Encounter: Admitting: Physical Therapy

## 2024-05-11 ENCOUNTER — Encounter: Admitting: Physical Therapy

## 2024-05-17 ENCOUNTER — Ambulatory Visit: Admitting: Physical Therapy

## 2024-05-18 ENCOUNTER — Ambulatory Visit: Admitting: Internal Medicine

## 2024-05-19 ENCOUNTER — Encounter: Admitting: Physical Therapy

## 2024-05-20 ENCOUNTER — Ambulatory Visit: Payer: Medicare Other

## 2024-05-23 ENCOUNTER — Ambulatory Visit

## 2024-05-23 ENCOUNTER — Telehealth: Payer: Self-pay

## 2024-05-23 ENCOUNTER — Encounter: Admitting: Physical Therapy

## 2024-05-23 MED ORDER — BUDESONIDE-FORMOTEROL FUMARATE 80-4.5 MCG/ACT IN AERO: 2.0000 | INHALATION_SPRAY | Freq: Two times a day (BID) | RESPIRATORY_TRACT | 12 refills | Status: DC

## 2024-05-23 NOTE — Telephone Encounter (Addendum)
 I spoke with the patient. She is now complaining of back pain as well. I notified her of [name] response. She does not want to try the Symbicort . She asked again about the Qvar .  Per Izetta Rouleau, NP- Symbicort  is recommended. If she does not want to try that she can try the Qvar  but she is at risk for another flare up.  I have notified the patient. She now says she can not afford the Qvar  and would like to go back on the Spiriva  because she has it at home.   Per Izetta- Symbicort  is recommended. If she wants to d/c the Trelegy and go back on Spiriva  that will be her choice and she can follow up with Dr. Isaiah for further recommendations.   I have notified the patient.   Nothing further needed.

## 2024-05-23 NOTE — Telephone Encounter (Signed)
 I spoke with the patient. She said she has been having lightheaded and dizziness episodes for 2 months. At first they were 1 every now and they but now she is having them 1-2 a week. She is having increased SOB that has been going on for 1 month. She is also having hoarseness and is having to clear her throat more. She started on the Trelegy in May and she thinks this is what is causing her episodes. She said she has recently had a full Cardiac work-up and that was good. She wants to know if you are okay with her switching back to her Spiriva  and stopping the Trelegy to see if her episodes stop as well? She does not need a prescription because she still has some at home.   Dr. Isaiah is out of the office. Izetta can you advise since you saw her last?

## 2024-05-23 NOTE — Telephone Encounter (Signed)
 I notified the patient. She said she thinks she read that Symbicort  is not to be used for Asthma so she does not want to use it. She said she has used Qvar  in the past and it helped but caused her to have thrush. She would like to try that again is she could and she will make sure to rinse her mouth out well after using.   [name] please advise.

## 2024-05-23 NOTE — Telephone Encounter (Signed)
 She really needs ICS for her asthma management. She has inflammation in the airways, which is treated by steroids. Spiriva  does not have this. Let's try Symbicort  2 puffs Twice daily at a lower dose ICS and see how she responds to this. Thanks.

## 2024-05-23 NOTE — Telephone Encounter (Signed)
 Symbicort  is a preferred inhaler for asthma management. Recommend the Symbicort  over the Qvar  as it has the additional LABA. Thanks.

## 2024-05-23 NOTE — Telephone Encounter (Signed)
 Copied from CRM [id number]. Topic: Clinical - Prescription Issue >> May 23, 2024 10:27 AM Shona RAMAN wrote: Reason for CRM: patient is having trouble with her current inhaler and would like to stop taking today nad start taking previous inhaler, please call patient.

## 2024-05-24 ENCOUNTER — Encounter: Admitting: Physical Therapy

## 2024-05-25 ENCOUNTER — Encounter: Admitting: Physical Therapy

## 2024-05-30 ENCOUNTER — Encounter: Admitting: Physical Therapy

## 2024-06-01 ENCOUNTER — Telehealth: Payer: Self-pay | Admitting: *Deleted

## 2024-06-01 ENCOUNTER — Other Ambulatory Visit: Payer: Self-pay | Admitting: Nurse Practitioner

## 2024-06-01 ENCOUNTER — Telehealth: Payer: Self-pay

## 2024-06-01 ENCOUNTER — Encounter: Admitting: Physical Therapy

## 2024-06-01 ENCOUNTER — Ambulatory Visit: Payer: Medicare Other | Admitting: *Deleted

## 2024-06-01 VITALS — Ht 64.5 in | Wt 170.0 lb

## 2024-06-01 DIAGNOSIS — E538 Deficiency of other specified B group vitamins: Secondary | ICD-10-CM

## 2024-06-01 DIAGNOSIS — E034 Atrophy of thyroid (acquired): Secondary | ICD-10-CM

## 2024-06-01 DIAGNOSIS — K2100 Gastro-esophageal reflux disease with esophagitis, without bleeding: Secondary | ICD-10-CM

## 2024-06-01 DIAGNOSIS — E119 Type 2 diabetes mellitus without complications: Secondary | ICD-10-CM

## 2024-06-01 DIAGNOSIS — I10 Essential (primary) hypertension: Secondary | ICD-10-CM

## 2024-06-01 DIAGNOSIS — Z0000 Encounter for general adult medical examination without abnormal findings: Secondary | ICD-10-CM

## 2024-06-01 DIAGNOSIS — E785 Hyperlipidemia, unspecified: Secondary | ICD-10-CM

## 2024-06-01 MED ORDER — OMEGA-3-ACID ETHYL ESTERS 1 G PO CAPS
1.0000 g | ORAL_CAPSULE | Freq: Two times a day (BID) | ORAL | 3 refills | Status: AC
Start: 2024-06-01 — End: ?

## 2024-06-01 MED ORDER — MECLIZINE HCL 25 MG PO TABS: 25.0000 mg | ORAL_TABLET | Freq: Three times a day (TID) | ORAL | 3 refills | Status: AC | PRN

## 2024-06-01 MED ORDER — LEVOTHYROXINE SODIUM 50 MCG PO TABS: 50.0000 ug | ORAL_TABLET | Freq: Every day | ORAL | 3 refills | Status: AC

## 2024-06-01 MED ORDER — CYANOCOBALAMIN 1000 MCG/ML IJ SOLN: 1000.0000 ug | INTRAMUSCULAR | 3 refills | Status: AC

## 2024-06-01 MED ORDER — FAMOTIDINE 20 MG PO TABS: 20.0000 mg | ORAL_TABLET | Freq: Every day | ORAL | 3 refills | Status: AC | PRN

## 2024-06-01 MED ORDER — MONTELUKAST SODIUM 10 MG PO TABS: 10.0000 mg | ORAL_TABLET | Freq: Every day | ORAL | 3 refills | Status: DC

## 2024-06-01 NOTE — Telephone Encounter (Signed)
 Performed AWV today. Patient was upset because the last time she was in some of her medications were not refilled like they were suppose to be. Patient stated that it is hard for her to get in to see her PCP when she needs to be seen.  Patient stated that she has some back pain and is scheduled to see Dr. Abbey tomorrow and wants her to refill her medications that were not refilled correctly at last visit.  Patient stated that she plans on moving soon and wants to see Dr Marylynn before she moves. Patient apologized to me for being so ill with me during the visit. Advised patient that I would have the front office supervisor call her.

## 2024-06-01 NOTE — Progress Notes (Signed)
 Subjective:   NEMIAH BUBAR is a 73 y.o. who presents for a Medicare Wellness preventive visit.  As a reminder, Annual Wellness Visits don't include a physical exam, and some assessments may be limited, especially if this visit is performed virtually. We may recommend an in-person follow-up visit with your provider if needed.  Visit Complete: Virtual I connected with  [name] on 06/01/24 by a audio enabled telemedicine application and verified that I am speaking with the correct person using two identifiers.  Patient Location: Home  Provider Location: Home Office  I discussed the limitations of evaluation and management by telemedicine. The patient expressed understanding and agreed to proceed.  Vital Signs: Because this visit was a virtual/telehealth visit, some criteria may be missing or patient reported. Any vitals not documented were not able to be obtained and vitals that have been documented are patient reported.  VideoDeclined- This patient declined Librarian, academic. Therefore the visit was completed with audio only.  Persons Participating in Visit: Patient.  AWV Questionnaire: No: Patient Medicare AWV questionnaire was not completed prior to this visit.  Cardiac Risk Factors include: advanced age (>72men, >45 women);diabetes mellitus;dyslipidemia;hypertension     Objective:    Today's Vitals   06/01/24 1133  Weight: 170 lb (77.1 kg)  Height: 5' 4.5 (1.638 m)  PainSc: 2    Body mass index is 28.73 kg/m.     06/01/2024   11:57 AM 06/01/2023   10:28 AM 02/10/2023   12:28 PM 05/28/2022   11:43 AM 05/27/2021    3:27 PM 12/29/2020    2:21 PM 05/23/2020   11:25 AM  Advanced Directives  Does Patient Have a Medical Advance Directive? Yes Yes No No No No No  Type of Estate agent of [place];Living will Healthcare Power of [place];Living will       Copy of Healthcare Power of Attorney in Chart? No - copy requested No  - copy requested       Would patient like information on creating a medical advance directive?    No - Patient declined No - Patient declined No - Patient declined No - Patient declined    Current Medications (verified) Outpatient Encounter Medications as of 06/01/2024  Medication Sig   albuterol  (PROVENTIL  HFA) 108 (90 Base) MCG/ACT inhaler Inhale 2 puffs into the lungs every 6 (six) hours as needed.   ALPRAZolam  (XANAX ) 0.25 MG tablet TAKE 1 TABLET (0.25 MG TOTAL) BY MOUTH ONCE DAILY AS NEEDED FOR SLEEP.   atorvastatin  (LIPITOR) 80 MG tablet Take 1 tablet (80 mg total) by mouth daily.   azelastine  (ASTELIN ) 0.1 % nasal spray Place 2 sprays into both nostrils 2 (two) times daily. Use in each nostril as directed   benzonatate  (TESSALON ) 200 MG capsule Take 1 capsule (200 mg total) by mouth 3 (three) times daily as needed for cough.   Blood Glucose Calibration High LIQD Used to check accuracy of blood glucose machine.   cetirizine  (ZYRTEC ) 10 MG tablet Take 1 tablet (10 mg total) by mouth daily.   chlorpheniramine  (CHLOR-TRIMETON ) 4 MG tablet Take 1 tablet (4 mg total) by mouth at bedtime as needed for allergies or rhinitis.   Cholecalciferol (VITAMIN D3) 50 MCG (2000 UT) TABS Take 1 tablet by mouth daily.   Coenzyme Q10 (COQ10 PO) Take by mouth daily.   conjugated estrogens  (PREMARIN ) vaginal cream Place vaginally 2 (two) times a week. INSERT 0.5 APPLICATORS FULL VAGINALLY TWICE PER WEEK. AT BEDTIME.  cyanocobalamin  (VITAMIN B12) 1000 MCG/ML injection Inject 1 mL (1,000 mcg total) into the muscle every 30 (thirty) days.   cycloSPORINE  (RESTASIS ) 0.05 % ophthalmic emulsion Place 1 drop into both eyes 2 (two) times daily.   desonide (DESOWEN) 0.05 % cream Apply topically as needed.   diazepam  (VALIUM ) 5 MG tablet Take 1 tablet (5 mg total) by mouth every 12 (twelve) hours as needed (Vertigo).   Docusate Calcium  (STOOL SOFTENER PO) Take 1 tablet by mouth as needed.   EPINEPHrine  0.3 mg/0.3 mL IJ  SOAJ injection 0.3 mg by injection route.   estradiol  (VIVELLE -DOT) 0.1 MG/24HR patch Place 1 patch (0.1 mg total) onto the skin 2 (two) times a week.   famotidine  (PEPCID ) 20 MG tablet Take 1 tablet (20 mg total) by mouth daily as needed for heartburn or indigestion.   fluticasone  (FLONASE ) 50 MCG/ACT nasal spray Place 1 spray into both nostrils daily.   glucose blood (ONETOUCH VERIO) test strip Used to check blood sugars 3 times daily.   hydrocortisone  (ANUSOL -HC) 2.5 % rectal cream Place 1 Application rectally 2 (two) times daily. For 10 days and then as needed   ipratropium (ATROVENT ) 0.03 % nasal spray Place 2 sprays into both nostrils 3 (three) times daily as needed for rhinitis.   levothyroxine  (SYNTHROID ) 50 MCG tablet Take 1 tablet (50 mcg total) by mouth daily before breakfast.   losartan  (COZAAR ) 50 MG tablet Take 1 tablet (50 mg total) by mouth daily.   MAGNESIUM  PO Take 250 mg by mouth daily.   meclizine  (ANTIVERT ) 25 MG tablet Take 1 tablet (25 mg total) by mouth 3 (three) times daily as needed (vertigo).   metoprolol  succinate (TOPROL -XL) 50 MG 24 hr tablet Take 1 tablet (50 mg total) by mouth daily.   Miconazole Nitrate (LOTRIMIN  AF) 2 % AERO Apply topically.   montelukast  (SINGULAIR ) 10 MG tablet Take 1 tablet (10 mg total) by mouth at bedtime.   Multiple Vitamin (MULTIVITAMIN) tablet Take 1 tablet by mouth daily.   mupirocin  ointment (BACTROBAN ) 2 % Apply topically as needed.   NONFORMULARY OR COMPOUNDED ITEM Spironolactone  (PF) 0.005 mg opth. Both eyes 4 times daily   omega-3 acid ethyl esters (LOVAZA ) 1 g capsule Take 1 capsule (1 g total) by mouth 2 (two) times daily.   ondansetron  (ZOFRAN  ODT) 4 MG disintegrating tablet Take 1 tablet (4 mg total) by mouth every 8 (eight) hours as needed for nausea or vomiting.   OneTouch Delica Lancets 33G MISC Used to check blood sugars twice daly.   Polyethyl Glycol-Propyl Glycol 0.4-0.3 % SOLN Apply to eye.   Probiotic Product (PROBIOTIC  DAILY PO) Take by mouth.   Spacer/Aero-Holding Chambers (AEROCHAMBER MV) inhaler Use as instructed   Syringe/Needle, Disp, (SYRINGE 3CC/20GX1) 20G X 1 3 ML MISC 1 Syringe by Does not apply route every 30 (thirty) days. For use with b-12 injection monthly   tretinoin  (RETIN-A ) 0.05 % cream Apply topically at bedtime.   urea  (CARMOL) 40 % CREA Apply topically.   Wheat Dextrin (BENEFIBER DRINK MIX PO) Take by mouth. Use as directed   budesonide -formoterol  (SYMBICORT ) 80-4.5 MCG/ACT inhaler Inhale 2 puffs into the lungs in the morning and at bedtime. (Patient not taking: Reported on 06/01/2024)   ciclopirox (PENLAC) 8 % solution Apply topically at bedtime. Apply over nail and surrounding skin. Apply daily over previous coat. After seven (7) days, may remove with alcohol and continue cycle. (Patient not taking: Reported on 06/01/2024)   cyclobenzaprine  (FLEXERIL ) 5 MG tablet TAKE  1 TABLET BY MOUTH EVERYDAY AT BEDTIME (Patient not taking: Reported on 06/01/2024)   Propylene Glycol 0.6 % SOLN Apply 1 drop to eye as needed. (Patient not taking: Reported on 06/01/2024)   No facility-administered encounter medications on file as of 06/01/2024.    Allergies (verified) Codeine, Olodaterol, Qvar  [beclomethasone], and Sulfonamide derivatives   History: Past Medical History:  Diagnosis Date   Agatston coronary artery calcium  score less than 100    a. 06/2018 Cardiac CT: Ca2+ = 3 (49th %'ile).   Allergic rhinitis 06/09/2008   chronic   Allergy     Aortic atherosclerosis (HCC)    a. 06/2018 noted on chest CT.   Arthritis    Asthma, intrinsic 10/06/2011   controlled on qvar . Spiro 09/2011:  Very mild airflow obstruction (FEV1% 68, FVL c/w obstruction)    Basal cell carcinoma 06/10/2021   right prox nasal ala rim - MOHs 07/12/21 Dr. Bluford   Cataract    Chronic headache 06/09/2008   COPD (chronic obstructive pulmonary disease) (HCC)    Depression    Diverticulosis 2013   h/o diverticulitis   Dry eyes    GERD  (gastroesophageal reflux disease)    Heart murmur    History of anal fissures    x2   History of breast implant removal    silicone mastitis - R axilla silicone LN, free silicone L breast upper outer quadrant   Hot flashes    HYPERLIPIDEMIA 06/09/2008   HYPERTENSION 06/09/2008   Hypothyroidism    MVP (mitral valve prolapse)    OSA on CPAP    [name]   PAF (paroxysmal atrial fibrillation) (HCC) 06/09/2008   a. CHA2DS2VASc = 3-->prefers ASA.   Pernicious anemia    per prior pcp   Prediabetes    6.9 previous A1c   Rosacea    Sleep apnea    Surgical menopause 1991   Vertigo    Vitamin D  deficiency    Past Surgical History:  Procedure Laterality Date   APPENDECTOMY  2007   bladder tack     bladder tack  08/2022   additional 12/24   BREAST BIOPSY Left 09/27/2009   BREAST ENHANCEMENT SURGERY  2006   and removal   CARDIOVASCULAR STRESS TEST  2013   [place] Dr. [place] - WNL, EF 55-60%, with 0 calcium  score   CESAREAN SECTION     CHOLECYSTECTOMY  2007   biliary dyskinesia   COLONOSCOPY  08/2012   diverticulosis   COLONOSCOPY  2006   inflamed hyperplastic polyp   ESOPHAGOGASTRODUODENOSCOPY  08/2012   esophageal reflux   ESOPHAGOGASTRODUODENOSCOPY  11/02/2008   Dr Charlanne Minimal gastritis. Irregular Z line suggestive of gastroesophageal reflux (biopsied to rule out short-segment Barrett's esophagus) Incidental gastric polyps   HERNIA REPAIR     NASAL SINUS SURGERY     RECTOCELE REPAIR  12/2008   Dr. Lewanda   sleep study  08/2007   OSA, 12 cm H2O,   TOTAL ABDOMINAL HYSTERECTOMY  1991   heavy bleeding, ovaries removed   TRIGGER FINGER RELEASE Right 06/18/2023   TUBAL LIGATION     tummy tuck     US  ECHOCARDIOGRAPHY  09/2009   impaired relaxation, mild tric regurg, normal MV, EF 60%   US  ECHOCARDIOGRAPHY  11/2007   mild-mod MR   Family History  Problem Relation Age of Onset   Cancer Mother 18       lung, passive smoke   Cancer Father 69       lung, smoker  Colon  polyps Sister    COPD Sister    Stroke Sister        TIA   Hypertension Sister    Heart failure Sister    Aortic dissection Maternal Aunt    Breast cancer Maternal Aunt    Cancer Paternal Aunt        breast, lung   Aortic dissection Paternal Uncle    Heart attack Paternal Uncle    AAA (abdominal aortic aneurysm) Paternal Uncle    Heart disease Paternal Uncle    Stroke Paternal Uncle    Cancer Cousin        breast   Colon cancer Neg Hx    Rectal cancer Neg Hx    Stomach cancer Neg Hx    Esophageal cancer Neg Hx    Social History   Socioeconomic History   Marital status: Married    Spouse name: Not on file   Number of children: Not on file   Years of education: Not on file   Highest education level: Associate degree: occupational, Scientist, product/process development, or vocational program  Occupational History   Occupation: retired  Tobacco Use   Smoking status: Former    Current packs/day: 0.00    Average packs/day: 0.3 packs/day for 7.0 years (2.1 ttl pk-yrs)    Types: Cigarettes    Start date: 10/27/1970    Quit date: 10/27/1977    Years since quitting: 46.6    Passive exposure: Past   Smokeless tobacco: Never   Tobacco comments:    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 Hx of children who are grown  Vaping Use   Vaping status: Never Used  Substance and Sexual Activity   Alcohol use: Never   Drug use: No   Sexual activity: Not on file  Other Topics Concern   Not on file  Social History Narrative   Lives with husband    Grown children   Occ: retired, was Research scientist (medical)   Edu: trade degree then 2 yrscollege   Social Drivers of Corporate investment banker Strain: Low Risk  (06/01/2024)   Overall Financial Resource Strain (CARDIA)    Difficulty of Paying Living Expenses: Not hard at all  Food Insecurity: No Food Insecurity (06/01/2024)   Hunger Vital Sign    Worried About Running Out of Food in the Last Year: Never true    Ran Out of Food in the Last Year: Never true   Transportation Needs: No Transportation Needs (06/01/2024)   PRAPARE - Administrator, Civil Service (Medical): No    Lack of Transportation (Non-Medical): No  Physical Activity: Sufficiently Active (06/01/2024)   Exercise Vital Sign    Days of Exercise per Week: 3 days    Minutes of Exercise per Session: 50 min  Stress: Stress Concern Present (06/01/2024)   [employer] of Occupational Health - Occupational Stress Questionnaire    Feeling of Stress: Rather much  Social Connections: Moderately Isolated (06/01/2024)   Social Connection and Isolation Panel    Frequency of Communication with Friends and Family: More than three times a week    Frequency of Social Gatherings with Friends and Family: More than three times a week    Attends Religious Services: Never    Database administrator or Organizations: No    Attends Banker Meetings: Never    Marital Status: Married    Tobacco Counseling Counseling given: Not Answered Tobacco comments: 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 Hx of children who are grown    Clinical Intake:  Pre-visit preparation completed: Yes  Pain : 0-10 Pain Score: 2  Pain Type: Chronic pain Pain Location: Back Pain Orientation: Upper, Right Pain Descriptors / Indicators: Dull Pain Onset: 1 to 4 weeks ago Pain Frequency: Intermittent     BMI - recorded: 28.73 Nutritional Status: BMI 25 -29 Overweight Nutritional Risks: None Diabetes: Yes CBG done?: No Did pt. bring in CBG monitor from home?: No  Lab Results  Component Value Date   HGBA1C 5.9 (H) 04/22/2024   HGBA1C 6.3 12/14/2023   HGBA1C 6.1 (H) 06/19/2023     How often do you need to have someone help you when you read instructions, pamphlets, or other written materials from your doctor or pharmacy?: 1 - Never  Interpreter Needed?: No  Information entered by :: R. Rainier Feuerborn LPN   Activities of Daily Living     06/01/2024   11:37 AM  In your  present state of health, do you have any difficulty performing the following activities:  Hearing? 0  Vision? 0  Difficulty concentrating or making decisions? 0  Walking or climbing stairs? 0  Dressing or bathing? 0  Doing errands, shopping? 0  Preparing Food and eating ? N  Using the Toilet? N  In the past six months, have you accidently leaked urine? N  Do you have problems with loss of bowel control? N  Managing your Medications? N  Managing your Finances? N  Housekeeping or managing your Housekeeping? N    Patient Care Team: Marylynn Verneita CROME, MD as PCP - General (Internal Medicine) Gollan, Timothy J, MD as PCP - Cardiology (Cardiology)  I have updated your Care Teams any recent Medical Services you may have received from other providers in the past year.     Assessment:   This is a routine wellness examination for Terriyah.  Hearing/Vision screen Hearing Screening - Comments:: No issues Vision Screening - Comments:: glasses   Goals Addressed             This Visit's Progress    Patient Stated       Wants to work on having less stress in her life       Depression Screen     06/01/2024   11:52 AM 03/03/2024    1:08 PM 12/22/2023   11:46 AM 10/12/2023    2:17 PM 09/18/2023    1:59 PM 06/01/2023   10:22 AM 01/19/2023   12:12 PM  PHQ 2/9 Scores  PHQ - 2 Score 0 0 0 0 0 0 0  PHQ- 9 Score 2 3  6  1      Fall Risk     06/01/2024   11:40 AM 03/03/2024    1:08 PM 12/22/2023   11:46 AM 10/12/2023    2:16 PM 09/18/2023    1:59 PM  Fall Risk   Falls in the past year? 0 0 0 1 1  Number falls in past yr: 0 0 0 0 0  Injury with Fall? 0 0 0 1 1  Risk for fall due to : No Fall Risks No Fall Risks No Fall Risks History of fall(s) History of fall(s)  Follow up Falls evaluation completed;Falls prevention discussed Falls evaluation completed;Education provided Falls evaluation completed Falls evaluation completed Falls evaluation completed    MEDICARE RISK AT HOME:  Medicare  Risk at Home Any stairs in or around the home?: No If so, are there any without handrails?: No Home  free of loose throw rugs in walkways, pet beds, electrical cords, etc?: Yes Adequate lighting in your home to reduce risk of falls?: Yes Life alert?: No Use of a cane, walker or w/c?: No Grab bars in the bathroom?: No Shower chair or bench in shower?: Yes Elevated toilet seat or a handicapped toilet?: Yes  TIMED UP AND GO:  Was the test performed?  No  Cognitive Function: 6CIT completed    05/23/2020   11:28 AM 05/20/2018   11:04 AM  MMSE - Mini Mental State Exam  Not completed: Unable to complete   Orientation to time  5  Orientation to Place  5  Registration  3  Attention/ Calculation  5  Recall  3  Language- name 2 objects  2  Language- repeat  1  Language- follow 3 step command  3  Language- read & follow direction  1  Write a sentence  1  Copy design  1  Total score  30        06/01/2024   11:58 AM 06/01/2023   10:29 AM 05/23/2019   10:31 AM  6CIT Screen  What Year? 0 points  0 points  What month? 0 points  0 points  What time? 0 points 0 points 0 points  Count back from 20 0 points 0 points 0 points  Months in reverse 0 points 0 points 0 points  Repeat phrase 0 points 0 points 0 points  Total Score 0 points  0 points    Immunizations Immunization History  Administered Date(s) Administered   Fluad Quad(high Dose 65+) 07/16/2019, 07/20/2020, 08/05/2021   Influenza Split 07/15/2013, 07/12/2015, 07/14/2016, 08/04/2017   Influenza Whole 07/27/2009, 07/28/2011, 07/21/2012   Influenza, High Dose Seasonal PF 07/22/2017, 07/01/2018, 08/15/2022   Influenza, Seasonal, Injecte, Preservative Fre 07/16/2013   Influenza,inj,Quad PF,6+ Mos 08/03/2014, 07/12/2015, 07/14/2016   Influenza-Unspecified 07/15/2013, 08/03/2014, 07/12/2015, 07/14/2016, 08/04/2017, 07/20/2023   Moderna Covid-19 Fall Seasonal Vaccine 43yrs & older 10/28/2022   Moderna Covid-19 Vaccine Bivalent  Booster 84yrs & up 07/25/2021   Moderna SARS-COV2 Booster Vaccination 08/17/2020, 02/01/2021   Moderna Sars-Covid-2 Vaccination 12/11/2019, 01/08/2020, 07/25/2021   PPD Test 12/22/2023   Pneumococcal Conjugate-13 11/08/2014   Pneumococcal Polysaccharide-23 07/27/2009, 12/27/2015   Respiratory Syncytial Virus Vaccine,Recomb Aduvanted(Arexvy) 07/25/2022, 08/14/2022   Td 10/27/2008, 08/17/2023   Td (Adult),5 Lf Tetanus Toxid, Preservative Free 08/17/2023   Tdap 12/19/2013, 02/25/2017   Tetanus 10/27/2008   Zoster Recombinant(Shingrix) 04/27/2020, 11/26/2020   Zoster, Live 10/27/2009, 11/04/2011    Screening Tests Health Maintenance  Topic Date Due   COVID-19 Vaccine (5 - 2024-25 season) 06/28/2023   OPHTHALMOLOGY EXAM  03/29/2024   MAMMOGRAM  05/19/2024   Medicare Annual Wellness (AWV)  05/31/2024   INFLUENZA VACCINE  05/27/2024   FOOT EXAM  06/18/2024   HEMOGLOBIN A1C  10/22/2024   Diabetic kidney evaluation - eGFR measurement  12/13/2024   Diabetic kidney evaluation - Urine ACR  12/13/2024   Colonoscopy  08/23/2027   DTaP/Tdap/Td (7 - Td or Tdap) 08/16/2033   Pneumococcal Vaccine: 50+ Years  Completed   DEXA SCAN  Completed   Hepatitis C Screening  Completed   Zoster Vaccines- Shingrix  Completed   Hepatitis B Vaccines  Aged Out   HPV VACCINES  Aged Out   Meningococcal B Vaccine  Aged Out    Health Maintenance  Health Maintenance Due  Topic Date Due   COVID-19 Vaccine (5 - 2024-25 season) 06/28/2023   OPHTHALMOLOGY EXAM  03/29/2024   MAMMOGRAM  05/19/2024   Medicare Annual Wellness (AWV)  05/31/2024   INFLUENZA VACCINE  05/27/2024   Health Maintenance Items Addressed: Dicussed the need to update flu vaccine annually. Patient declines covid vaccine.  Patient wants to discuss a dexa at next office visit.   Additional Screening:  Vision Screening: Recommended annual ophthalmology exams for early detection of glaucoma and other disorders of the eye. [facility].. Patient stated that she will call and schedule an appointment Would you like a referral to an eye doctor? No    Dental Screening: Recommended annual dental exams for proper oral hygiene  Community Resource Referral / Chronic Care Management: CRR required this visit?  No   CCM required this visit?  No   Plan:    I have personally reviewed and noted the following in the patient's chart:   Medical and social history Use of alcohol, tobacco or illicit drugs  Current medications and supplements including opioid prescriptions. Patient is not currently taking opioid prescriptions. Functional ability and status Nutritional status Physical activity Advanced directives List of other physicians Hospitalizations, surgeries, and ER visits in previous 12 months Vitals Screenings to include cognitive, depression, and falls Referrals and appointments  In addition, I have reviewed and discussed with patient certain preventive protocols, quality metrics, and best practice recommendations. A written personalized care plan for preventive services as well as general preventive health recommendations were provided to patient.   Angeline Fredericks, LPN   10/29/7972   After Visit Summary: (MyChart) Due to this being a telephonic visit, the after visit summary with patients personalized plan was offered to patient via MyChart   Notes: Nothing significant to report at this time.

## 2024-06-01 NOTE — Telephone Encounter (Signed)
 Copied from CRM [id number]. Topic: Clinical - Prescription Issue >> Jun 01, 2024  2:36 PM Angela Mendoza wrote: Reason for CRM: Pt is calling due to receiving budesonide -formoterol  (SYMBICORT ) 80-4.5 MCG/ACT inhaler [[id number]] by mail. She received it with 11 refills meaning it was set up as a monthly amount for refills, instead of an order shipment for 3 months supply and up to 3 refills of meds for up to a year, introducing more porch fees for the pt as well as a longer turn around time to receive her medication.  Pt also mentioned this is the same prescription Dr. Shellia had tried previously in 2023 or 2024 with complications including heart palpitations. Pt also mentioned at the time she was taking a good amount of prednisone  as well, as there could've been an interaction between the two, but she wanted to make sure that was noted.   There was an encounter from multiple dialogues between [facility], the patient, and indirectly with NP Comer Rouleau about the patient requesting to be placed back on Spiriva  instead of Symbicort . Although she attempted multiple times to explain her concerns, the patient instead received the prescription of udesonide-formoterol  (SYMBICORT ) 80-4.5 MCG/ACT inhaler [[id number]] by mail.  Pt would like to have a few concerns answered: 1) Does Spiriva  have the same main ingredients to treat Asthma as Qvar ? 2) If not, what are the main differences when it comes to treating Asthma? 3) If they are the same or more similar than different in treating Asthma, the patient would like to remain on Spiriva  through [facility] service as she is able to receive her medication at a reduced cost than a local drug store. 4) Comparing her previous experience with Spiriva , and her current health, would the same strength of Spiriva  be recommended or a strong dose for her treatment plan? 5) Patient is out of her rescue inhaler, and was interested in AIRSUPRA as a rescue inhaler replacement due to her  feeling as if her regular rescue inhaler albuterol  (PROVENTIL  HFA) 108 (90 Base) MCG/ACT inhaler not having a major positive effect on her Asthma.  Please call the pt back at phone number [phone] ok to leave a vm to address her concerns. >> Jun 01, 2024  3:01 PM Angela Mendoza wrote: Pt stated she has an appt from 3pm-5pm, and the patient will not be available for a phone call. Please call before if possible.

## 2024-06-01 NOTE — Telephone Encounter (Signed)
 I have refilled all medications prescribed by Dr. Tullo except for the Estradiol  patch and cream because I'm not sure if she is using both. I have left a message for pt to give us  a call back with that information. Pt is also wanting to have lab work done before her appt with Dr. Marylynn however her appt is scheduled for the 22nd and her last A1c was done on 04/22/2024 so that will be to early cause A1c has to be 90 days apart. I have pended a TSH, CMP, CBC, lipid panel and LDL for your approval. Is there anything else that pt needs to have done?

## 2024-06-01 NOTE — Addendum Note (Signed)
 Addended by: HARRIETTE RAISIN on: 06/01/2024 05:33 PM   Modules accepted: Orders

## 2024-06-01 NOTE — Telephone Encounter (Signed)
 The patient called to request an appointment with her provider, Dr. Marylynn, before she and her husband move to [place] . She elaborated on the reasons concerning her husband's health, stating they would have helped their while staying at a family home. She has requested that all her medications be filled and sent to the [facility] with a 90-day 33-month supply. She also stated she would like labs done as well to follow up on her thyroid  and cholesterol medication, and her glucose, due to taking prednisone  and Trelegy. She also requested a Bone Density order to be sent to [facility]. She will see Dr. Abbey on 06/02/24 for back pain.

## 2024-06-01 NOTE — Telephone Encounter (Signed)
 Dr. Isaiah, please see telephone encounter from 7/28 as well.

## 2024-06-01 NOTE — Telephone Encounter (Signed)
 Copied from CRM #[id number]. Topic: Clinical - Medication Refill >> Jun 01, 2024  2:56 PM Celestine F wrote: Medication: montelukast  (SINGULAIR ) 10 MG tablet.   Pt is requesting a 3 month supply refillable 3 times per year.   Has the patient contacted their pharmacy? Yes; no refills available.  (Agent: If no, request that the patient contact the pharmacy for the refill. If patient does not wish to contact the pharmacy document the reason why and proceed with request.) (Agent: If yes, when and what did the pharmacy advise?)  This is the patient's preferred pharmacy:  [facility] - [place], [place] - [address] [place] [address] Phone: [phone] Fax: [phone]  Is this the correct pharmacy for this prescription? Yes If no, delete pharmacy and type the correct one.   Has the prescription been filled recently? Yes  Is the patient out of the medication? Yes  Has the patient been seen for an appointment in the last year OR does the patient have an upcoming appointment? Yes  Can we respond through MyChart? No; pt prefers a phone call to verify the refill.   Agent: Please be advised that Rx refills may take up to 3 business days. We ask that you follow-up with your pharmacy.

## 2024-06-01 NOTE — Patient Instructions (Addendum)
 Angela Mendoza , Thank you for taking time out of your busy schedule to complete your Annual Wellness Visit with me. I enjoyed our conversation and look forward to speaking with you again next year. I, as well as your care team,  appreciate your ongoing commitment to your health goals. Please review the following plan we discussed and let me know if I can assist you in the future. Your Game plan/ To Do List    Referrals: If you haven't heard from the office you've been referred to, please reach out to them at the phone provided.  Remember to call and schedule a diabetic eye exam and get your annual flu vaccine.  Follow up Visits: We will see or speak with you next year for your Next Medicare AWV with our clinical staff    Patient is in the process of moving out of state Have you seen your provider in the last 6 months (3 months if uncontrolled diabetes)? Yes  Clinician Recommendations:  Aim for 30 minutes of exercise or brisk walking, 6-8 glasses of water, and 5 servings of fruits and vegetables each day.       This is a list of the screenings recommended for you:  Health Maintenance  Topic Date Due   COVID-19 Vaccine (5 - 2024-25 season) 06/28/2023   Eye exam for diabetics  03/29/2024   Mammogram  05/19/2024   Flu Shot  05/27/2024   Complete foot exam   06/18/2024   Hemoglobin A1C  10/22/2024   Yearly kidney function blood test for diabetes  12/13/2024   Yearly kidney health urinalysis for diabetes  12/13/2024   Medicare Annual Wellness Visit  06/01/2025   Colon Cancer Screening  08/23/2027   DTaP/Tdap/Td vaccine (7 - Td or Tdap) 08/16/2033   Pneumococcal Vaccine for age over 6  Completed   DEXA scan (bone density measurement)  Completed   Hepatitis C Screening  Completed   Zoster (Shingles) Vaccine  Completed   Hepatitis B Vaccine  Aged Out   HPV Vaccine  Aged Out   Meningitis B Vaccine  Aged Out    Advanced directives: (Copy Requested) Please bring a copy of your health care power  of attorney and living will to the office to be added to your chart at your convenience. You can mail to [facility] [address] [place], [place] [address] or email to [email or web address] Advance Care Planning is important because it:  [x]  Makes sure you receive the medical care that is consistent with your values, goals, and preferences  [x]  It provides guidance to your family and loved ones and reduces their decisional burden about whether or not they are making the right decisions based on your wishes.

## 2024-06-01 NOTE — Addendum Note (Signed)
 Addended by: MARYLYNN VERNEITA CROME on: 06/01/2024 05:50 PM   Modules accepted: Orders

## 2024-06-01 NOTE — Telephone Encounter (Signed)
 I left a message asking the patient to return my call regarding her experience in the office.

## 2024-06-02 ENCOUNTER — Ambulatory Visit (INDEPENDENT_AMBULATORY_CARE_PROVIDER_SITE_OTHER)

## 2024-06-02 ENCOUNTER — Encounter: Payer: Self-pay | Admitting: Cardiovascular Disease

## 2024-06-02 ENCOUNTER — Other Ambulatory Visit: Payer: Self-pay

## 2024-06-02 VITALS — BP 120/70 | HR 76 | Temp 98.1°F | Resp 17 | Ht 65.0 in | Wt 172.2 lb

## 2024-06-02 DIAGNOSIS — M546 Pain in thoracic spine: Secondary | ICD-10-CM | POA: Diagnosis not present

## 2024-06-02 DIAGNOSIS — I10 Essential (primary) hypertension: Secondary | ICD-10-CM

## 2024-06-02 DIAGNOSIS — M549 Dorsalgia, unspecified: Secondary | ICD-10-CM | POA: Insufficient documentation

## 2024-06-02 MED ORDER — ESTRADIOL 0.1 MG/24HR TD PTTW: 1.0000 | MEDICATED_PATCH | TRANSDERMAL | 3 refills | Status: DC

## 2024-06-02 MED ORDER — PREMARIN 0.625 MG/GM VA CREA: TOPICAL_CREAM | VAGINAL | 4 refills | Status: AC

## 2024-06-02 MED ORDER — CYCLOBENZAPRINE HCL 5 MG PO TABS
5.0000 mg | ORAL_TABLET | Freq: Every evening | ORAL | 0 refills | Status: AC | PRN
Start: 2024-06-02 — End: ?

## 2024-06-02 NOTE — Assessment & Plan Note (Signed)
-   Right upper back pain likely MSK related given muscle spasm on exam and HPI with increased physical activity . Will do UA to r/o hematuria.  - Apply diclofenac  gel to affected area up to four times daily as needed. - Use heating or cooling pad as needed for pain relief. - Take Tylenol 500 mg every eight hours as needed for pain. - Prescribe Cyclobenzaprine  5 mg, take one tablet daily, prn preferably at night. Use it with caution, medication s/e discussed.  - Avoid lifting heavy objects and activities that trigger pain. - Engage in gentle exercises and stretches to maintain mobility.

## 2024-06-02 NOTE — Progress Notes (Signed)
 Acute Office Visit  Subjective:    Patient ID: Angela MCCROSKEY, female    DOB: 16-Aug-1951, 73 y.o.   MRN: [id number]  Chief Complaint  Patient presents with   Back Pain    C/O right upper back pain x 3 weeks. Comes & goes. Dull pain. Pain Scale: 3, tylenol & heating pad helps some. But its becoming more constant    HPI Discussed the use of AI scribe software for clinical note transcription with the patient, who gave verbal consent to proceed.  History of Present Illness Angela Mendoza is a 73 year old female who presents with right upper back pain.  She experiences a dull ache around the right shoulder blade, occasionally becoming sharp, and she has noticed what appears to be swelling in that area when looking in the mirror. Her husband was recently hospitalized and she has been doing more household chores prior to pain onset.  Tylenol and heat application provide some relief.  No fever, chills, nausea, vomiting, stomach pain, diarrhea, urinary frequency, urgency, or burning. She has no history of kidney stones but is concerned about them due to the location of her pain.    ROS As per HPI    Objective:    BP 120/70 (BP Location: Left Arm, Patient Position: Sitting, Cuff Size: Large)   Pulse 76   Temp 98.1 F (36.7 C) (Oral)   Resp 17   Ht 5' 5 (1.651 m)   Wt 172 lb 4 oz (78.1 kg)   SpO2 100%   BMI 28.66 kg/m    Physical Exam HENT:     Head: Normocephalic and atraumatic.  Cardiovascular:     Rate and Rhythm: Normal rate.  Pulmonary:     Breath sounds: Normal breath sounds. No wheezing.  Abdominal:     Palpations: Abdomen is soft.     Tenderness: There is no right CVA tenderness, left CVA tenderness, guarding or rebound.  Musculoskeletal:     Cervical back: Normal range of motion.     Thoracic back: Spasms (Pain, muscle spasm along right thorasis paraspinal region along T8-T10 midscapular line) present. No bony tenderness.     Lumbar back: Negative right  straight leg raise test and negative left straight leg raise test.     Right lower leg: No edema.     Left lower leg: No edema.  Neurological:     Mental Status: She is alert and oriented to person, place, and time.     Gait: Gait normal.     No results found for any visits on 06/02/24.     Assessment & Plan:   Acute right-sided thoracic back pain Assessment & Plan: - Right upper back pain likely MSK related given muscle spasm on exam and HPI with increased physical activity . Will do UA to r/o hematuria.  - Apply diclofenac  gel to affected area up to four times daily as needed. - Use heating or cooling pad as needed for pain relief. - Take Tylenol 500 mg every eight hours as needed for pain. - Prescribe Cyclobenzaprine  5 mg, take one tablet daily, prn preferably at night. Use it with caution, medication s/e discussed.  - Avoid lifting heavy objects and activities that trigger pain. - Engage in gentle exercises and stretches to maintain mobility.   Orders: -     Urinalysis, Routine w reflex microscopic -     Cyclobenzaprine  HCl; Take 1 tablet (5 mg total) by mouth at bedtime as needed for muscle  spasms.  Dispense: 10 tablet; Refill: 0    Return if symptoms worsen or fail to improve.  Luke Shade, MD

## 2024-06-02 NOTE — Patient Instructions (Addendum)
-   Apply diclofenac  gel 1% up to four times a day as needed. You can get this over the counter. You can take Tylenol 500 mg every 8 hourly as needed for pain.  - Heating/cool pad. Avoid activities that triggers the pain.  - You can take muscle relaxant called Flexeril  5 mg, at bedtime as needed.  - Discuss this with your PCP during your upcoming appointment as well.

## 2024-06-02 NOTE — Telephone Encounter (Signed)
 I notified the patient. She is already scheduled to see Dr. Isaiah on 8/21. She will keep that appt.  Nothing further needed.

## 2024-06-03 DIAGNOSIS — H0288B Meibomian gland dysfunction left eye, upper and lower eyelids: Secondary | ICD-10-CM | POA: Diagnosis not present

## 2024-06-03 DIAGNOSIS — H2513 Age-related nuclear cataract, bilateral: Secondary | ICD-10-CM | POA: Diagnosis not present

## 2024-06-03 DIAGNOSIS — H0288A Meibomian gland dysfunction right eye, upper and lower eyelids: Secondary | ICD-10-CM | POA: Diagnosis not present

## 2024-06-03 LAB — URINALYSIS, ROUTINE W REFLEX MICROSCOPIC
Bilirubin Urine: NEGATIVE
Hgb urine dipstick: NEGATIVE
Ketones, ur: NEGATIVE
Leukocytes,Ua: NEGATIVE
Nitrite: NEGATIVE
Specific Gravity, Urine: <=1.005 — AB (ref 1.000–1.030)
Total Protein, Urine: NEGATIVE
Urine Glucose: NEGATIVE
Urobilinogen, UA: 0.2 (ref 0.0–1.0)
pH: 7 (ref 5.0–8.0)

## 2024-06-03 MED ORDER — ATORVASTATIN CALCIUM 80 MG PO TABS: 80.0000 mg | ORAL_TABLET | Freq: Every day | ORAL | 3 refills | Status: DC

## 2024-06-03 MED ORDER — LOSARTAN POTASSIUM 50 MG PO TABS: 50.0000 mg | ORAL_TABLET | Freq: Every day | ORAL | 3 refills | Status: DC

## 2024-06-03 MED ORDER — METOPROLOL SUCCINATE ER 50 MG PO TB24
50.0000 mg | ORAL_TABLET | Freq: Every day | ORAL | 3 refills | Status: AC
Start: 2024-06-03 — End: ?

## 2024-06-03 NOTE — Telephone Encounter (Signed)
 Pt is aware that medications have been refilled and lab appt has been scheduled.

## 2024-06-05 ENCOUNTER — Ambulatory Visit: Payer: Self-pay

## 2024-06-05 NOTE — Progress Notes (Signed)
 Please let the patient know I reviewed her urine results which is reassuring and does not suggest of urinary tract infection or kidney stone.   Thank you,  Luke Shade, MD

## 2024-06-06 ENCOUNTER — Other Ambulatory Visit: Payer: Self-pay | Admitting: Internal Medicine

## 2024-06-07 ENCOUNTER — Encounter: Admitting: Physical Therapy

## 2024-06-07 NOTE — Telephone Encounter (Signed)
 Refilled: 10/12/2023 Last OV: 12/22/2023 Next OV: 06/17/2024

## 2024-06-08 ENCOUNTER — Ambulatory Visit
Admission: RE | Admit: 2024-06-08 | Discharge: 2024-06-08 | Disposition: A | Discharge status: Home / Self Care | Source: Ambulatory Visit | Attending: Vascular Surgery | Admitting: Vascular Surgery

## 2024-06-08 DIAGNOSIS — Z1231 Encounter for screening mammogram for malignant neoplasm of breast: Secondary | ICD-10-CM

## 2024-06-09 ENCOUNTER — Ambulatory Visit: Admitting: Physical Therapy

## 2024-06-13 ENCOUNTER — Ambulatory Visit: Admitting: Physical Therapy

## 2024-06-13 ENCOUNTER — Other Ambulatory Visit (INDEPENDENT_AMBULATORY_CARE_PROVIDER_SITE_OTHER)

## 2024-06-13 DIAGNOSIS — E538 Deficiency of other specified B group vitamins: Secondary | ICD-10-CM | POA: Diagnosis not present

## 2024-06-13 DIAGNOSIS — E785 Hyperlipidemia, unspecified: Secondary | ICD-10-CM | POA: Diagnosis not present

## 2024-06-13 DIAGNOSIS — E034 Atrophy of thyroid (acquired): Secondary | ICD-10-CM | POA: Diagnosis not present

## 2024-06-13 DIAGNOSIS — E1169 Type 2 diabetes mellitus with other specified complication: Secondary | ICD-10-CM

## 2024-06-13 DIAGNOSIS — I10 Essential (primary) hypertension: Secondary | ICD-10-CM

## 2024-06-13 DIAGNOSIS — E119 Type 2 diabetes mellitus without complications: Secondary | ICD-10-CM

## 2024-06-13 LAB — CBC WITH DIFFERENTIAL/PLATELET
Basophils Absolute: 0 K/uL (ref 0.0–0.1)
Basophils Relative: 0.4 % (ref 0.0–3.0)
Eosinophils Absolute: 0.1 K/uL (ref 0.0–0.7)
Eosinophils Relative: 2.3 % (ref 0.0–5.0)
HCT: 36.8 % (ref 36.0–46.0)
Hemoglobin: 12.2 g/dL (ref 12.0–15.0)
Lymphocytes Relative: 35.4 % (ref 12.0–46.0)
Lymphs Abs: 1.7 K/uL (ref 0.7–4.0)
MCHC: 33.2 g/dL (ref 30.0–36.0)
MCV: 92.7 fl (ref 78.0–100.0)
Monocytes Absolute: 0.7 K/uL (ref 0.1–1.0)
Monocytes Relative: 13.7 % — ABNORMAL HIGH (ref 3.0–12.0)
Neutro Abs: 2.3 K/uL (ref 1.4–7.7)
Neutrophils Relative %: 48.2 % (ref 43.0–77.0)
Platelets: 249 K/uL (ref 150.0–400.0)
RBC: 3.96 Mil/uL (ref 3.87–5.11)
RDW: 13.6 % (ref 11.5–15.5)
WBC: 4.8 K/uL (ref 4.0–10.5)

## 2024-06-13 LAB — LDL CHOLESTEROL, DIRECT: Direct LDL: 81 mg/dL

## 2024-06-13 LAB — COMPREHENSIVE METABOLIC PANEL WITH GFR
ALT: 21 U/L (ref 0–35)
AST: 24 U/L (ref 0–37)
Albumin: 4.4 g/dL (ref 3.5–5.2)
Alkaline Phosphatase: 67 U/L (ref 39–117)
BUN: 12 mg/dL (ref 6–23)
CO2: 28 meq/L (ref 19–32)
Calcium: 9 mg/dL (ref 8.4–10.5)
Chloride: 105 meq/L (ref 96–112)
Creatinine, Ser: 0.79 mg/dL (ref 0.40–1.20)
GFR: 74.13 mL/min (ref >60.00)
Glucose, Bld: 89 mg/dL (ref 70–99)
Potassium: 4.2 meq/L (ref 3.5–5.1)
Sodium: 142 meq/L (ref 135–145)
Total Bilirubin: 0.8 mg/dL (ref 0.2–1.2)
Total Protein: 7.2 g/dL (ref 6.0–8.3)

## 2024-06-13 LAB — LIPID PANEL
Cholesterol: 149 mg/dL (ref 0–200)
HDL: 48.7 mg/dL (ref >39.00)
LDL Cholesterol: 78 mg/dL (ref 0–99)
NonHDL: 100.55
Total CHOL/HDL Ratio: 3
Triglycerides: 115 mg/dL (ref 0.0–149.0)
VLDL: 23 mg/dL (ref 0.0–40.0)

## 2024-06-13 LAB — TSH: TSH: 3.46 u[IU]/mL (ref 0.35–5.50)

## 2024-06-15 ENCOUNTER — Encounter: Admitting: Physical Therapy

## 2024-06-16 ENCOUNTER — Telehealth: Payer: Self-pay | Admitting: Cardiovascular Disease

## 2024-06-16 ENCOUNTER — Ambulatory Visit: Payer: Self-pay | Admitting: Internal Medicine

## 2024-06-16 ENCOUNTER — Encounter: Payer: Self-pay | Admitting: Internal Medicine

## 2024-06-16 ENCOUNTER — Ambulatory Visit (INDEPENDENT_AMBULATORY_CARE_PROVIDER_SITE_OTHER): Admitting: Internal Medicine

## 2024-06-16 VITALS — BP 130/70 | HR 78 | Temp 98.3°F | Ht 65.0 in | Wt 171.0 lb

## 2024-06-16 DIAGNOSIS — J019 Acute sinusitis, unspecified: Secondary | ICD-10-CM | POA: Diagnosis not present

## 2024-06-16 DIAGNOSIS — J4520 Mild intermittent asthma, uncomplicated: Secondary | ICD-10-CM

## 2024-06-16 LAB — NITRIC OXIDE: Nitric Oxide: 60

## 2024-06-16 MED ORDER — SPIRIVA RESPIMAT 1.25 MCG/ACT IN AERS: 2.0000 | INHALATION_SPRAY | Freq: Every day | RESPIRATORY_TRACT | Status: DC

## 2024-06-16 MED ORDER — MONTELUKAST SODIUM 10 MG PO TABS
10.0000 mg | ORAL_TABLET | Freq: Every day | ORAL | 3 refills | Status: AC
Start: 2024-06-16 — End: ?

## 2024-06-16 MED ORDER — ALBUTEROL SULFATE HFA 108 (90 BASE) MCG/ACT IN AERS: 2.0000 | INHALATION_SPRAY | Freq: Four times a day (QID) | RESPIRATORY_TRACT | 10 refills | Status: DC | PRN

## 2024-06-16 MED ORDER — FLUTICASONE PROPIONATE 50 MCG/ACT NA SUSP: 1.0000 | Freq: Every day | NASAL | 10 refills | Status: AC

## 2024-06-16 MED ORDER — SPIRIVA RESPIMAT 2.5 MCG/ACT IN AERS: 2.0000 | INHALATION_SPRAY | Freq: Every day | RESPIRATORY_TRACT | 3 refills | Status: DC

## 2024-06-16 NOTE — Patient Instructions (Signed)
 Increase Spiriva  Respimat to 2.5 Use as directed  Continue to use albuterol  as needed Recommend using Flonase  and Zyrtec  for allergic rhinitis  Avoid Allergens and Irritants Avoid secondhand smoke Avoid SICK contacts Recommend  Masking  when appropriate Recommend Keep up-to-date with vaccinations   Excellent [employer] A+ GOLD STAR!!  Continue CPAP as prescribed  Patient Instructions Continue to use CPAP every night, minimum of 4-6 hours a night.  Change equipment every 30 days or as directed by DME.  Wash your tubing with warm soap and water daily, hang to dry. Wash humidifier portion weekly. Use bottled, distilled water and change daily   Be aware of reduced alertness and do not drive or operate heavy machinery if experiencing this or drowsiness.  Exercise encouraged, as tolerated. Encouraged proper weight management.  Important to get eight or more hours of sleep  Limiting the use of the computer and television before bedtime.  Decrease naps during the day, so night time sleep will become enhanced.  Limit caffeine, and sleep deprivation.

## 2024-06-16 NOTE — Progress Notes (Unsigned)
 [facility]   Brief Summary:  73 year old female with asthma and OSA    Past Medical History:  Vit D deficiency, Vertigo, Rosacea, Hypothyroidism, HTN, HLD, GERD, Diverticulosis, Depression, HA, COVID 12 February 2021  Past Surgical History:  She  has a past surgical history that includes Cholecystectomy (2007); Appendectomy (2007); Tubal ligation; Total abdominal hysterectomy (1991); Nasal sinus surgery; Breast enhancement surgery (2006); Cardiovascular stress test (2013); Colonoscopy (08/2012); Esophagogastroduodenoscopy (08/2012); US  ECHOCARDIOGRAPHY (09/2009); US  ECHOCARDIOGRAPHY (11/2007); sleep study (08/2007); Rectocele repair (12/2008); Colonoscopy (2006); Breast biopsy (Left, 09/27/2009); Hernia repair; bladder tack; tummy tuck; Esophagogastroduodenoscopy (11/02/2008); Cesarean section; bladder tack (08/2022); and Trigger finger release (Right, 06/18/2023).     CC Follow-up assessment for OSA Follow-up assessment for asthma   Subjective:   Re:OSA  Discussed sleep data and reviewed with patient.  Encouraged proper weight management.  Discussed driving precautions and its relationship with hypersomnolence.  Discussed sleep hygiene, and benefits of a fixed sleep waked time.  The importance of getting eight or more hours of sleep discussed with patient.  Discussed limiting the use of the computer and television before bedtime.  Decrease naps during the day, so night time sleep will become enhanced.  Limit caffeine, and sleep deprivation.   Patient uses and benefits from therapy Using CPAP nightly and with naps Pressure setting is comfortable and is sleeping well.  Patient uses and benefits from therapy Using CPAP nightly and with naps Pressure setting is comfortable and is sleeping well. Auto CPAP 5-10 Previous AHI 19 Less fatigue more energy AHI reduced to 0.2  [id number] Diagnosed in 2004 Multiple inhalers in the  past Patient currently on Trelegy inhaler  Assessment of ASTHMA FeNO  60  ppb-Elevated exhaled Nitric oxide  testing is highly consistent with type II inflammation Lab Results  Component Value Date   NITRICOXIDE 60 06/16/2024     No exacerbation at this time No evidence of heart failure at this time No evidence or signs of infection at this time No respiratory distress No fevers, chills, nausea, vomiting, diarrhea No evidence of lower extremity edema No evidence hemoptysis   Multiple nasal sprays and antihistamines Triggers, cold air, perfumes, dust, carpet in bedroom    Physical Exam:  BP 130/70 (BP Location: Right Arm, Patient Position: Sitting, Cuff Size: Normal)   Pulse 78   Temp 98.3 F (36.8 C) (Oral)   Ht 5' 5 (1.651 m)   Wt 171 lb (77.6 kg)   SpO2 98%   BMI 28.46 kg/m      Review of Systems: Gen:  Denies  fever, sweats, chills weight loss  HEENT: Denies blurred vision, double vision, ear pain, eye pain, hearing loss, nose bleeds, sore throat Cardiac:  No dizziness, chest pain or heaviness, chest tightness,edema, No JVD Resp:   No cough, -sputum production, -shortness of breath,-wheezing, -hemoptysis,  Other:  All other systems negative   Physical Examination:   General Appearance: No distress  EYES PERRLA, EOM intact.   NECK Supple, No JVD Pulmonary: normal breath sounds, No wheezing.  CardiovascularNormal S1,S2.  No m/r/g.   Abdomen: Benign, Soft, non-tender. Neurology UE/LE 5/5 strength, no focal deficits Ext pulses intact, cap refill intact ALL OTHER ROS ARE NEGATIVE        Pulmonary testing:  Spirometry 12/12 >> FEV1% 68 RAST 11/15/14 >> IgE 30, cats/mold PFT 02/07/16 >> FEV1 2.32 (92%), FEV1% 76, FEF 25-75% 1.56 (71%), TLC 4.25 (81%), DLCO 121, borderline BD from FEF 25-75 FeNO 12/01/18 >> 7  IgE 12/16/21 >> 23 FeNO 62 01/2024 FeNO 06/16/2024 >>60   Chest Imaging:  Coronary CT 04/04/21 >> stable 2 mm nodule LLL, coronary calcium  score  0 CT sinus 01/09/22 >> normal CT angio chest 07/18/22 >> mild dependent atelectasis  Sleep Tests:  PSG 09/06/07 >> AHI 19 CPAP 05/17/23 to 06/15/23 >> used on 30 of 30 nights with average 7 hrs 15 min.  Average AHI 0.7 with median CPAP 6 and 95 th percentile CPAP 8 cm H2O CPAP DL 88/7975 AutoCPAP 4-89       AHI 1, 100% compliance days >4hrs  Cardiac Tests:  Echo 01/23/21 >> EF 60 to 65%, grade 2 DD, RVSP 39.1 mmHg, mild MR  Social History:  She  reports that she quit smoking about 46 years ago. Her smoking use included cigarettes. She started smoking about 53 years ago. She has a 2.1 pack-year smoking history. She has been exposed to tobacco smoke. She has never used smokeless tobacco. She reports that she does not drink alcohol and does not use drugs.  Family History:  Her family history includes AAA (abdominal aortic aneurysm) in her paternal uncle; Aortic dissection in her maternal aunt and paternal uncle; Breast cancer (age of onset: 43 - 27) in her cousin; Breast cancer (age of onset: 59 - 43) in her maternal aunt and paternal aunt; COPD in her sister; Colon polyps in her sister; Heart attack in her paternal uncle; Heart disease in her paternal uncle; Heart failure in her sister; Hypertension in her sister; Lung cancer in her paternal aunt; Lung cancer (age of onset: 1) in her father; Lung cancer (age of onset: 44) in her mother; Stroke in her paternal uncle and sister.     Assessment/Plan:  43 year old pleasant white female seen today for follow-up assessment for underlying sleep apnea underlying asthma with chronic allergic rhinitis   Assessment of OSA Previous AHI 19 Continue CPAP as prescribed  Excellent compliance report Reviewed compliance report in detail with patient Patient definitely benefits the use of CPAP therapy as prescribed Using CPAP nightly and with naps Pressure setting is comfortable and is sleeping well. CPAP prescription 5-10 AHI reduced to 0.8  No evidence  of acute heart failure at this time No respiratory distress No fevers, chills, nausea, vomiting, diarrhea No evidence hemoptysis  Patient Instructions Continue to use CPAP every night, minimum of 4-6 hours a night.  Change equipment every 30 days or as directed by DME.  Wash your tubing with warm soap and water daily, hang to dry. Wash humidifier portion weekly. Use bottled, distilled water and change daily   Be aware of reduced alertness and do not drive or operate heavy machinery if experiencing this or drowsiness.  Exercise encouraged, as tolerated. Encouraged proper weight management.  Important to get eight or more hours of sleep  Limiting the use of the computer and television before bedtime.  Decrease naps during the day, so night time sleep will become enhanced.  Limit caffeine, and sleep deprivation.  HTN, stroke, uncontrolled diabetes and heart failure are potential risk factors.  Risk of untreated sleep apnea including cardiac arrhthymias, stroke, DM, pulm HTN.    Chronic allergic rhinitis FlonFlonase 2 sprays each nostril once pre day Atrovent  nasal spray/Astelin  spray needed Continue Zyrtec  10 mg dily   Allergic asthma-asthma exacerbation at this time FENO check 60 Plan to continue with Spiriva  Respimat however will increase dosage to 2.5 Will NOT start biological agents at this time Patient is waiting for RHEUMOTOGICAL DX  Avoid Allergens and Irritants Avoid secondhand smoke Avoid SICK contacts Recommend  Masking  when appropriate Recommend Keep up-to-date with vaccinations      Medication List:   Allergies as of 06/16/2024       Reactions   Codeine Other (See Comments)   Low blood pressure/nausea   Olodaterol Palpitations   Qvar  [beclomethasone] Other (See Comments)   Thrush per patient even with rinsing.    Sulfonamide Derivatives Rash   rash        Medication List        Accurate as of June 16, 2024  9:17 AM. If you have any questions,  ask your nurse or doctor.          AeroChamber MV inhaler Use as instructed   albuterol  108 (90 Base) MCG/ACT inhaler Commonly known as: Proventil  HFA Inhale 2 puffs into the lungs every 6 (six) hours as needed.   ALPRAZolam  0.25 MG tablet Commonly known as: XANAX  TAKE 1 TABLET (0.25 MG TOTAL) BY MOUTH ONCE DAILY AS NEEDED FOR SLEEP.   atorvastatin  80 MG tablet Commonly known as: LIPITOR Take 1 tablet (80 mg total) by mouth daily.   azelastine  0.1 % nasal spray Commonly known as: ASTELIN  Place 2 sprays into both nostrils 2 (two) times daily. Use in each nostril as directed   BENEFIBER DRINK MIX PO Take by mouth. Use as directed   benzonatate  200 MG capsule Commonly known as: TESSALON  Take 1 capsule (200 mg total) by mouth 3 (three) times daily as needed for cough.   Blood Glucose Calibration High Liqd Used to check accuracy of blood glucose machine.   cetirizine  10 MG tablet Commonly known as: ZYRTEC  Take 1 tablet (10 mg total) by mouth daily.   chlorpheniramine  4 MG tablet Commonly known as: CHLOR-TRIMETON  Take 1 tablet (4 mg total) by mouth at bedtime as needed for allergies or rhinitis.   COQ10 PO Take by mouth daily.   cyanocobalamin  1000 MCG/ML injection Commonly known as: VITAMIN B12 Inject 1 mL (1,000 mcg total) into the muscle every 30 (thirty) days.   cyclobenzaprine  5 MG tablet Commonly known as: FLEXERIL  Take 1 tablet (5 mg total) by mouth at bedtime as needed for muscle spasms.   cycloSPORINE  0.05 % ophthalmic emulsion Commonly known as: RESTASIS  Place 1 drop into both eyes 2 (two) times daily.   desonide 0.05 % cream Commonly known as: DESOWEN Apply topically as needed.   diazepam  5 MG tablet Commonly known as: VALIUM  TAKE 1 TABLET BY MOUTH EVERY 6 HOURS AS NEEDED FOR ANXIETY.   EPINEPHrine  0.3 mg/0.3 mL Soaj injection Commonly known as: EPI-PEN 0.3 mg by injection route.   estradiol  0.1 MG/24HR patch Commonly known as:  VIVELLE -DOT Place 1 patch (0.1 mg total) onto the skin 2 (two) times a week.   famotidine  20 MG tablet Commonly known as: PEPCID  Take 1 tablet (20 mg total) by mouth daily as needed for heartburn or indigestion.   fluticasone  50 MCG/ACT nasal spray Commonly known as: FLONASE  Place 1 spray into both nostrils daily.   hydrocortisone  2.5 % rectal cream Commonly known as: ANUSOL -HC Place 1 Application rectally 2 (two) times daily. For 10 days and then as needed   ipratropium 0.03 % nasal spray Commonly known as: ATROVENT  Place 2 sprays into both nostrils 3 (three) times daily as needed for rhinitis.   levothyroxine  50 MCG tablet Commonly known as: Synthroid  Take 1 tablet (50 mcg total) by mouth daily before breakfast.   losartan  50 MG  tablet Commonly known as: COZAAR  Take 1 tablet (50 mg total) by mouth daily.   Lotrimin  AF 2 % Aero Generic drug: Miconazole Nitrate Apply topically.   MAGNESIUM  PO Take 250 mg by mouth daily.   meclizine  25 MG tablet Commonly known as: ANTIVERT  Take 1 tablet (25 mg total) by mouth 3 (three) times daily as needed (vertigo).   metoprolol  succinate 50 MG 24 hr tablet Commonly known as: TOPROL -XL Take 1 tablet (50 mg total) by mouth daily.   montelukast  10 MG tablet Commonly known as: SINGULAIR  Take 1 tablet (10 mg total) by mouth at bedtime.   multivitamin tablet Take 1 tablet by mouth daily.   mupirocin  ointment 2 % Commonly known as: BACTROBAN  Apply topically as needed.   NONFORMULARY OR COMPOUNDED ITEM Spironolactone  (PF) 0.005 mg opth. Both eyes 4 times daily   omega-3 acid ethyl esters 1 g capsule Commonly known as: Lovaza  Take 1 capsule (1 g total) by mouth 2 (two) times daily.   ondansetron  4 MG disintegrating tablet Commonly known as: Zofran  ODT Take 1 tablet (4 mg total) by mouth every 8 (eight) hours as needed for nausea or vomiting.   OneTouch Delica Lancets 33G Misc Used to check blood sugars twice daly.    OneTouch Verio test strip Generic drug: glucose blood Used to check blood sugars 3 times daily.   Polyethyl Glycol-Propyl Glycol 0.4-0.3 % Soln Apply to eye.   Premarin  vaginal cream Generic drug: conjugated estrogens  Place vaginally 2 (two) times a week. INSERT 0.5 APPLICATORS FULL VAGINALLY TWICE PER WEEK. AT BEDTIME.   PROBIOTIC DAILY PO Take by mouth.   Propylene Glycol 0.6 % Soln Apply 1 drop to eye as needed.   STOOL SOFTENER PO Take 1 tablet by mouth as needed.   SYRINGE 3CC/20GX1 20G X 1 3 ML Misc 1 Syringe by Does not apply route every 30 (thirty) days. For use with b-12 injection monthly   tiotropium 18 MCG inhalation capsule Commonly known as: SPIRIVA  Place 18 mcg into inhaler and inhale daily.   tretinoin  0.05 % cream Commonly known as: RETIN-A  Apply topically at bedtime.   urea  40 % Crea Commonly known as: CARMOL Apply topically.   Vitamin D3 50 MCG (2000 UT) Tabs Take 1 tablet by mouth daily.        MEDICATION ADJUSTMENTS/LABS AND TESTS ORDERED: Recommend TRELEGY one puff once per day Please rinse mouth and gargle after every use Flonase  2 sprays each nostril once pre day Atrovent  nasal spray/Astelin  spray needed Continue Zyrtec  Continue CPAP as prescribed    CURRENT MEDICATIONS REVIEWED AT LENGTH WITH PATIENT TODAY   Patient  satisfied with Plan of action and management. All questions answered  Follow up in 4 weeks   I spent a total of 54 minutes reviewing chart data, face-to-face evaluation with the patient, counseling and coordination of care as detailed above.      Nickolas Alm Cellar, M.D.  [facility]  Medical Director [facility] Medical Director [facility]

## 2024-06-16 NOTE — Telephone Encounter (Signed)
 Called and left message for call back.

## 2024-06-16 NOTE — Telephone Encounter (Signed)
 Pt increase the atorvastatin  fro 40 to 80 mg and pt is having legs cramps and ankle swelling and can pt go back to 40 and can [facility] [place]  renew a 90 day  for the 40mg .  Pt would like you to call her.  [phone]

## 2024-06-17 ENCOUNTER — Encounter: Payer: Self-pay | Admitting: Cardiovascular Disease

## 2024-06-17 ENCOUNTER — Ambulatory Visit (INDEPENDENT_AMBULATORY_CARE_PROVIDER_SITE_OTHER): Admitting: Internal Medicine

## 2024-06-17 VITALS — BP 126/82 | HR 89 | Temp 98.1°F | Ht 65.0 in | Wt 170.2 lb

## 2024-06-17 DIAGNOSIS — M545 Low back pain, unspecified: Secondary | ICD-10-CM | POA: Diagnosis not present

## 2024-06-17 DIAGNOSIS — E119 Type 2 diabetes mellitus without complications: Secondary | ICD-10-CM

## 2024-06-17 DIAGNOSIS — L500 Allergic urticaria: Secondary | ICD-10-CM | POA: Diagnosis not present

## 2024-06-17 MED ORDER — ATORVASTATIN CALCIUM 40 MG PO TABS: 80.0000 mg | ORAL_TABLET | Freq: Every day | ORAL | 3 refills | Status: AC

## 2024-06-17 MED ORDER — EZETIMIBE 10 MG PO TABS: 10.0000 mg | ORAL_TABLET | Freq: Every day | ORAL | 3 refills | Status: DC

## 2024-06-17 NOTE — Telephone Encounter (Signed)
 Called and spoke with patient. Patient's Atorvastatin  was increased in April from 40 MG to 80 MG. Patient reports that over the last 1-2 weeks she has had muscle cramps in her legs at night. Patient states that three days ago she decreased her Atorvastatin  to 40 MG. Patient states that she noticed an improvement in her muscle cramps last night. Patient would like recommendations from Dr. Gollan.

## 2024-06-17 NOTE — Telephone Encounter (Signed)
 Called patient and notified her of the following from Dr. Gollan.  If able to tolerate Lipitor 40 daily, would stay on that dose and start Zetia  10 daily for cholesterol Okay to take a day off the Lipitor here and there for leg cramps if needed Thx TGollan  Patient verbalizes understanding. Prescriptions sent to preferred pharmacy.

## 2024-06-17 NOTE — Patient Instructions (Signed)
 Your kidney function is excellent..  you do NOT need to reduce zyrtec   ,, take it at night instead of morning, to manage allergies.   Zetia  has been very well tolerated by my patients,  so I agree with a trial of it along with the reduced dose of atorvastatin     If you have time,  schedule a virtual visit before you leave  [place] ,  for November    We will miss you!!!!!

## 2024-06-17 NOTE — Progress Notes (Signed)
 Subjective:  Patient ID: Angela Mendoza, female    DOB: 20-Mar-1951  Age: 73 y.o. MRN: [id number]  CC: The primary encounter diagnosis was Acute bilateral low back pain without sciatica. Diagnoses of Allergic urticaria and Controlled type 2 diabetes mellitus without complication, without long-term current use of insulin (HCC) were also pertinent to this visit.   HPI Angela Mendoza presents for  Chief Complaint  Patient presents with   Back Pain    1) right sided thoracic pain  radiates to waist  .  The pain has been present for several weeks; during this time she has been packing and moving boxes in preparation for her residential move to [place] .   Saw Dr Abbey,  given  voltaren  gel and flexeril     2) HLD :  she is not tolerating 80 mg lipitor due to  worsening muscle pain in both legs, both hands.  She has reduced her  dose to 40 mg as of 3 days ago.   Discussed adding zetia  . Reviewed diet:  uses olive oil,  ghee ,   3)  She has not had any exacerbations lately   Outpatient Medications Prior to Visit  Medication Sig Dispense Refill   albuterol  (PROVENTIL  HFA) 108 (90 Base) MCG/ACT inhaler Inhale 2 puffs into the lungs every 6 (six) hours as needed. 18 g 10   ALPRAZolam  (XANAX ) 0.25 MG tablet TAKE 1 TABLET (0.25 MG TOTAL) BY MOUTH ONCE DAILY AS NEEDED FOR SLEEP. 30 tablet 5   atorvastatin  (LIPITOR) 40 MG tablet Take 2 tablets (80 mg total) by mouth daily. 90 tablet 3   azelastine  (ASTELIN ) 0.1 % nasal spray Place 2 sprays into both nostrils 2 (two) times daily. Use in each nostril as directed 90 mL 3   Blood Glucose Calibration High LIQD Used to check accuracy of blood glucose machine. 1 each 0   cetirizine  (ZYRTEC ) 10 MG tablet Take 1 tablet (10 mg total) by mouth daily. 90 tablet 3   chlorpheniramine  (CHLOR-TRIMETON ) 4 MG tablet Take 1 tablet (4 mg total) by mouth at bedtime as needed for allergies or rhinitis. 90 tablet 1   Cholecalciferol (VITAMIN D3) 50 MCG (2000 UT) TABS Take 1  tablet by mouth daily. 90 tablet 3   Coenzyme Q10 (COQ10 PO) Take by mouth daily.     conjugated estrogens  (PREMARIN ) vaginal cream Place vaginally 2 (two) times a week. INSERT 0.5 APPLICATORS FULL VAGINALLY TWICE PER WEEK. AT BEDTIME. 30 g 4   cyanocobalamin  (VITAMIN B12) 1000 MCG/ML injection Inject 1 mL (1,000 mcg total) into the muscle every 30 (thirty) days. 3 mL 3   cyclobenzaprine  (FLEXERIL ) 5 MG tablet Take 1 tablet (5 mg total) by mouth at bedtime as needed for muscle spasms. 10 tablet 0   cycloSPORINE  (RESTASIS ) 0.05 % ophthalmic emulsion Place 1 drop into both eyes 2 (two) times daily. 3 each 3   desonide (DESOWEN) 0.05 % cream Apply topically as needed.     diazepam  (VALIUM ) 5 MG tablet TAKE 1 TABLET BY MOUTH EVERY 6 HOURS AS NEEDED FOR ANXIETY. 30 tablet 0   Docusate Calcium  (STOOL SOFTENER PO) Take 1 tablet by mouth as needed.     EPINEPHrine  0.3 mg/0.3 mL IJ SOAJ injection 0.3 mg by injection route. 1 each 1   estradiol  (VIVELLE -DOT) 0.1 MG/24HR patch Place 1 patch (0.1 mg total) onto the skin 2 (two) times a week. 24 patch 3   ezetimibe  (ZETIA ) 10 MG tablet Take 1 tablet (10 mg  total) by mouth daily. 90 tablet 3   famotidine  (PEPCID ) 20 MG tablet Take 1 tablet (20 mg total) by mouth daily as needed for heartburn or indigestion. 90 tablet 3   fluticasone  (FLONASE ) 50 MCG/ACT nasal spray Place 1 spray into both nostrils daily. 9.9 mL 10   glucose blood (ONETOUCH VERIO) test strip Used to check blood sugars 3 times daily. 300 each 3   hydrocortisone  (ANUSOL -HC) 2.5 % rectal cream Place 1 Application rectally 2 (two) times daily. For 10 days and then as needed 30 g 4   ipratropium (ATROVENT ) 0.03 % nasal spray Place 2 sprays into both nostrils 3 (three) times daily as needed for rhinitis. 90 mL 3   levothyroxine  (SYNTHROID ) 50 MCG tablet Take 1 tablet (50 mcg total) by mouth daily before breakfast. 90 tablet 3   losartan  (COZAAR ) 50 MG tablet Take 1 tablet (50 mg total) by mouth daily.  90 tablet 3   Loteprednol Etabonate 0.5 % GEL PLEASE SEE ATTACHED FOR DETAILED DIRECTIONS     MAGNESIUM  PO Take 250 mg by mouth daily.     meclizine  (ANTIVERT ) 25 MG tablet Take 1 tablet (25 mg total) by mouth 3 (three) times daily as needed (vertigo). 84 tablet 3   metoprolol  succinate (TOPROL -XL) 50 MG 24 hr tablet Take 1 tablet (50 mg total) by mouth daily. 90 tablet 3   Miconazole Nitrate (LOTRIMIN  AF) 2 % AERO Apply topically.     montelukast  (SINGULAIR ) 10 MG tablet Take 1 tablet (10 mg total) by mouth at bedtime. 90 tablet 3   Multiple Vitamin (MULTIVITAMIN) tablet Take 1 tablet by mouth daily.     mupirocin  ointment (BACTROBAN ) 2 % Apply topically as needed.     NONFORMULARY OR COMPOUNDED ITEM Spironolactone  (PF) 0.005 mg opth. Both eyes 4 times daily     omega-3 acid ethyl esters (LOVAZA ) 1 g capsule Take 1 capsule (1 g total) by mouth 2 (two) times daily. 90 capsule 3   ondansetron  (ZOFRAN  ODT) 4 MG disintegrating tablet Take 1 tablet (4 mg total) by mouth every 8 (eight) hours as needed for nausea or vomiting. 30 tablet 2   OneTouch Delica Lancets 33G MISC Used to check blood sugars twice daly. 300 each 3   Polyethyl Glycol-Propyl Glycol 0.4-0.3 % SOLN Apply to eye.     Probiotic Product (PROBIOTIC DAILY PO) Take by mouth.     Propylene Glycol 0.6 % SOLN Apply 1 drop to eye as needed.     Spacer/Aero-Holding Chambers (AEROCHAMBER MV) inhaler Use as instructed 1 each 0   Syringe/Needle, Disp, (SYRINGE 3CC/20GX1) 20G X 1 3 ML MISC 1 Syringe by Does not apply route every 30 (thirty) days. For use with b-12 injection monthly 50 each 0   Tiotropium Bromide Monohydrate  (SPIRIVA  RESPIMAT) 1.25 MCG/ACT AERS      Tiotropium Bromide Monohydrate  (SPIRIVA  RESPIMAT) 1.25 MCG/ACT AERS Inhale 2 puffs into the lungs daily.     Tiotropium Bromide Monohydrate  (SPIRIVA  RESPIMAT) 2.5 MCG/ACT AERS Inhale 2 puffs into the lungs daily. 3 each 3   tretinoin  (RETIN-A ) 0.05 % cream Apply topically at  bedtime. 45 g 1   urea  (CARMOL) 40 % CREA Apply topically.     Wheat Dextrin (BENEFIBER DRINK MIX PO) Take by mouth. Use as directed     benzonatate  (TESSALON ) 200 MG capsule Take 1 capsule (200 mg total) by mouth 3 (three) times daily as needed for cough. (Patient not taking: Reported on 06/17/2024) 30 capsule 1   No  facility-administered medications prior to visit.    Review of Systems;  Patient denies headache, fevers, malaise, unintentional weight loss, skin rash, eye pain, sinus congestion and sinus pain, sore throat, dysphagia,  hemoptysis , cough, dyspnea, wheezing, chest pain, palpitations, orthopnea, edema, abdominal pain, nausea, melena, diarrhea, constipation, flank pain, dysuria, hematuria, urinary  Frequency, nocturia, numbness, tingling, seizures,  Focal weakness, Loss of consciousness,  Tremor, insomnia, depression, anxiety, and suicidal ideation.      Objective:  BP 126/82 (BP Location: Left Arm, Patient Position: Sitting, Cuff Size: Normal)   Pulse 89   Temp 98.1 F (36.7 C) (Oral)   Ht 5' 5 (1.651 m)   Wt 170 lb 3.2 oz (77.2 kg)   SpO2 97%   BMI 28.32 kg/m   BP Readings from Last 3 Encounters:  06/17/24 126/82  06/16/24 130/70  06/02/24 120/70    Wt Readings from Last 3 Encounters:  06/17/24 170 lb 3.2 oz (77.2 kg)  06/16/24 171 lb (77.6 kg)  06/02/24 172 lb 4 oz (78.1 kg)    Physical Exam Vitals reviewed.  Constitutional:      General: She is not in acute distress.    Appearance: Normal appearance. She is normal weight. She is not ill-appearing, toxic-appearing or diaphoretic.  HENT:     Head: Normocephalic.  Eyes:     General: No scleral icterus.       Right eye: No discharge.        Left eye: No discharge.     Conjunctiva/sclera: Conjunctivae normal.  Cardiovascular:     Rate and Rhythm: Normal rate and regular rhythm.     Heart sounds: Normal heart sounds.  Pulmonary:     Effort: Pulmonary effort is normal. No respiratory distress.      Breath sounds: Normal breath sounds.  Musculoskeletal:        General: Normal range of motion.  Skin:    General: Skin is warm and dry.  Neurological:     General: No focal deficit present.     Mental Status: She is alert and oriented to person, place, and time. Mental status is at baseline.  Psychiatric:        Mood and Affect: Mood normal.        Behavior: Behavior normal.        Thought Content: Thought content normal.        Judgment: Judgment normal.     Lab Results  Component Value Date   HGBA1C 5.9 (H) 04/22/2024   HGBA1C 6.3 12/14/2023   HGBA1C 6.1 (H) 06/19/2023    Lab Results  Component Value Date   CREATININE 0.79 06/13/2024   CREATININE 0.84 12/14/2023   CREATININE 0.73 05/04/2023    Lab Results  Component Value Date   WBC 4.8 06/13/2024   HGB 12.2 06/13/2024   HCT 36.8 06/13/2024   PLT 249.0 06/13/2024   GLUCOSE 89 06/13/2024   CHOL 149 06/13/2024   TRIG 115.0 06/13/2024   HDL 48.70 06/13/2024   LDLDIRECT 81.0 06/13/2024   LDLCALC 78 06/13/2024   ALT 21 06/13/2024   AST 24 06/13/2024   NA 142 06/13/2024   K 4.2 06/13/2024   CL 105 06/13/2024   CREATININE 0.79 06/13/2024   BUN 12 06/13/2024   CO2 28 06/13/2024   TSH 3.46 06/13/2024   INR 0.9 02/27/2022   HGBA1C 5.9 (H) 04/22/2024   MICROALBUR <0.7 12/14/2023    MM 3D SCREENING MAMMOGRAM BILATERAL BREAST Result Date: 06/09/2024 CLINICAL DATA:  Screening. EXAM:  DIGITAL SCREENING BILATERAL MAMMOGRAM WITH TOMOSYNTHESIS AND CAD TECHNIQUE: Bilateral screening digital craniocaudal and mediolateral oblique mammograms were obtained. Bilateral screening digital breast tomosynthesis was performed. The images were evaluated with computer-aided detection. COMPARISON:  Previous exam(s). ACR Breast Density Category c: The breasts are heterogeneously dense, which may obscure small masses. FINDINGS: There are no findings suspicious for malignancy. IMPRESSION: No mammographic evidence of malignancy. A result letter  of this screening mammogram will be mailed directly to the patient. RECOMMENDATION: Screening mammogram in one year. (Code:SM-B-01Y) BI-RADS CATEGORY  1: Negative. Electronically Signed   By: Craig Farr M.D.   On: 06/09/2024 16:07    Assessment & Plan:  .Acute bilateral low back pain without sciatica  Allergic urticaria Assessment & Plan: Reviewed her concerns about zyrtec 's safety whic are unfounded..  reassured that she can continue use of zyrtec  1-2 times daily    Controlled type 2 diabetes mellitus without complication, without long-term current use of insulin (HCC) Assessment & Plan: Currently well-controlled with  dietary modifications and restriction of simple sugars. Patient is reminded to schedule an annual eye exam  every spring  Patient has no microalbuminuria. Patient is tolerating statin therapy for CAD risk reduction and on ACE/ARB for renal protection and hypertension .  She is planning to resume  low impact aerobic exercising   . Lab Results  Component Value Date   HGBA1C 5.9 (H) 04/22/2024   Lab Results  Component Value Date   MICROALBUR <0.7 12/14/2023   MICROALBUR <0.2 05/30/2022           I spent 34 minutes on the day of this face to face encounter reviewing patient's  most recent visit with cardiology,  nephrology,  and neurology,  prior relevant surgical and non surgical procedures, recent  labs and imaging studies, counseling on weight management,  reviewing the assessment and plan with patient, and post visit ordering and reviewing of  diagnostics and therapeutics with patient  .   Follow-up: Return in about 3 months (around 09/17/2024).   Verneita LITTIE Kettering, MD

## 2024-06-19 NOTE — Assessment & Plan Note (Signed)
 Reviewed her concerns about zyrtec 's safety whic are unfounded..  reassured that she can continue use of zyrtec  1-2 times daily

## 2024-06-19 NOTE — Assessment & Plan Note (Signed)
 Currently well-controlled with  dietary modifications and restriction of simple sugars. Patient is reminded to schedule an annual eye exam  every spring  Patient has no microalbuminuria. Patient is tolerating statin therapy for CAD risk reduction and on ACE/ARB for renal protection and hypertension .  She is planning to resume  low impact aerobic exercising   . Lab Results  Component Value Date   HGBA1C 5.9 (H) 04/22/2024   Lab Results  Component Value Date   MICROALBUR <0.7 12/14/2023   MICROALBUR <0.2 05/30/2022

## 2024-06-20 ENCOUNTER — Encounter: Admitting: Physical Therapy

## 2024-06-20 DIAGNOSIS — N6012 Diffuse cystic mastopathy of left breast: Secondary | ICD-10-CM | POA: Diagnosis not present

## 2024-06-20 DIAGNOSIS — N6011 Diffuse cystic mastopathy of right breast: Secondary | ICD-10-CM | POA: Diagnosis not present

## 2024-06-23 ENCOUNTER — Other Ambulatory Visit: Payer: Self-pay | Admitting: Internal Medicine

## 2024-06-23 DIAGNOSIS — Z1331 Encounter for screening for depression: Secondary | ICD-10-CM | POA: Diagnosis not present

## 2024-06-23 DIAGNOSIS — H8103 Meniere's disease, bilateral: Secondary | ICD-10-CM | POA: Diagnosis not present

## 2024-06-23 DIAGNOSIS — R42 Dizziness and giddiness: Secondary | ICD-10-CM | POA: Diagnosis not present

## 2024-06-23 DIAGNOSIS — E785 Hyperlipidemia, unspecified: Secondary | ICD-10-CM | POA: Diagnosis not present

## 2024-06-23 DIAGNOSIS — E1169 Type 2 diabetes mellitus with other specified complication: Secondary | ICD-10-CM | POA: Diagnosis not present

## 2024-06-23 DIAGNOSIS — G43809 Other migraine, not intractable, without status migrainosus: Secondary | ICD-10-CM | POA: Diagnosis not present

## 2024-06-23 DIAGNOSIS — R413 Other amnesia: Secondary | ICD-10-CM | POA: Diagnosis not present

## 2024-06-23 NOTE — Telephone Encounter (Unsigned)
 Copied from CRM #[id number]. Topic: Clinical - Medication Refill >> Jun 23, 2024 11:58 AM Suzen RAMAN wrote: Medication: Neffy  (Epinephrine  nasal spray)  Has the patient contacted their pharmacy? Yes  This is the patient's preferred pharmacy:  [facility] - [place], [place] - [address] [place] [address] Phone: [phone] Fax: [phone]   Is this the correct pharmacy for this prescription? Yes If no, delete pharmacy and type the correct one.   Has the prescription been filled recently? No  Is the patient out of the medication? Yes  Has the patient been seen for an appointment in the last year OR does the patient have an upcoming appointment? Yes  Can we respond through MyChart? Yes  Agent: Please be advised that Rx refills may take up to 3 business days. We ask that you follow-up with your pharmacy.

## 2024-06-24 ENCOUNTER — Other Ambulatory Visit: Payer: Self-pay | Admitting: Neurology

## 2024-06-24 DIAGNOSIS — R413 Other amnesia: Secondary | ICD-10-CM

## 2024-06-24 MED ORDER — NEFFY 2 MG/0.1ML NA SOLN: 1.0000 | Freq: Once | NASAL | 3 refills | Status: AC

## 2024-06-24 NOTE — Telephone Encounter (Signed)
 Pt would to switch to the Neffy  instead of the Epi pen. I have pended for your approval.

## 2024-06-25 ENCOUNTER — Ambulatory Visit
Admission: RE | Admit: 2024-06-25 | Discharge: 2024-06-25 | Disposition: A | Discharge status: Home / Self Care | Source: Ambulatory Visit | Attending: Neurology | Admitting: Neurology

## 2024-06-25 DIAGNOSIS — I6782 Cerebral ischemia: Secondary | ICD-10-CM | POA: Diagnosis not present

## 2024-06-25 DIAGNOSIS — R413 Other amnesia: Secondary | ICD-10-CM | POA: Diagnosis not present

## 2024-06-29 ENCOUNTER — Ambulatory Visit: Payer: Self-pay | Admitting: Internal Medicine

## 2024-07-11 ENCOUNTER — Telehealth (INDEPENDENT_AMBULATORY_CARE_PROVIDER_SITE_OTHER): Admitting: Internal Medicine

## 2024-07-11 ENCOUNTER — Encounter: Payer: Self-pay | Admitting: Internal Medicine

## 2024-07-11 VITALS — Ht 65.0 in | Wt 165.0 lb

## 2024-07-11 DIAGNOSIS — F419 Anxiety disorder, unspecified: Secondary | ICD-10-CM

## 2024-07-11 DIAGNOSIS — E1169 Type 2 diabetes mellitus with other specified complication: Secondary | ICD-10-CM

## 2024-07-11 DIAGNOSIS — K219 Gastro-esophageal reflux disease without esophagitis: Secondary | ICD-10-CM

## 2024-07-11 DIAGNOSIS — E785 Hyperlipidemia, unspecified: Secondary | ICD-10-CM

## 2024-07-11 DIAGNOSIS — R7303 Prediabetes: Secondary | ICD-10-CM | POA: Insufficient documentation

## 2024-07-11 DIAGNOSIS — F411 Generalized anxiety disorder: Secondary | ICD-10-CM

## 2024-07-11 DIAGNOSIS — H8190 Unspecified disorder of vestibular function, unspecified ear: Secondary | ICD-10-CM

## 2024-07-11 MED ORDER — ALPRAZOLAM 0.25 MG PO TABS: 0.2500 mg | ORAL_TABLET | Freq: Every evening | ORAL | 0 refills | Status: AC | PRN

## 2024-07-11 MED ORDER — FLUOXETINE HCL 10 MG PO CAPS: 10.0000 mg | ORAL_CAPSULE | Freq: Every day | ORAL | 1 refills | Status: AC

## 2024-07-11 NOTE — Progress Notes (Unsigned)
 Virtual Visit via Caregility   Note   This format is felt to be most appropriate for this patient at this time.  All issues noted in this document were discussed and addressed.  No physical exam was performed (except for noted visual exam findings with Video Visits).   I connected with Angela Mendoza  on 07/11/24 at  5:00 PM EDT by a video enabled telemedicine application and verified that I am speaking with the correct person using two identifiers. Location patient: home Location provider: work or home office Persons participating in the virtual visit: patient, provider  I discussed the limitations, risks, security and privacy concerns of performing an evaluation and management service by telephone and the availability of in person appointments. I also discussed with the patient that there may be a patient responsible charge related to this service. The patient expressed understanding and agreed to proceed.  Reason for visit: INCREASED ANXIETY   HPI:  1) prescribed 60 mg Qulipta 60 mg for vestibular disorder by Dr Maree.  Worked,  but had side effects.  Even with 30 mg dose.    ROS: See pertinent positives and negatives per HPI.  Past Medical History:  Diagnosis Date   Agatston coronary artery calcium  score less than 100    a. 06/2018 Cardiac CT: Ca2+ = 3 (49th %'ile).   Allergic rhinitis 06/09/2008   chronic   Allergy  1970's   Aortic atherosclerosis (HCC)    a. 06/2018 noted on chest CT.   Arthritis 2000   Asthma, intrinsic 10/06/2011   controlled on qvar . Spiro 09/2011:  Very mild airflow obstruction (FEV1% 68, FVL c/w obstruction)    Basal cell carcinoma 06/10/2021   right prox nasal ala rim - MOHs 07/12/21 Dr. Bluford   Cataract    Chronic headache 06/09/2008   COPD (chronic obstructive pulmonary disease) (HCC)    Depression    Diverticulosis 2013   h/o diverticulitis   Dry eyes    GERD (gastroesophageal reflux disease) 2005   Heart murmur    History of anal fissures    x2   History  of breast implant removal    silicone mastitis - R axilla silicone LN, free silicone L breast upper outer quadrant   Hot flashes    HYPERLIPIDEMIA 06/09/2008   HYPERTENSION 06/09/2008   Hypothyroidism    MVP (mitral valve prolapse)    OSA on CPAP    [name]   PAF (paroxysmal atrial fibrillation) (HCC) 06/09/2008   a. CHA2DS2VASc = 3-->prefers ASA.   Pernicious anemia    per prior pcp   Prediabetes    6.9 previous A1c   Rosacea    Sleep apnea 2008   Surgical menopause 1991   Vertigo    Vitamin D  deficiency     Past Surgical History:  Procedure Laterality Date   APPENDECTOMY  2007   bladder tack     bladder tack  08/2022   additional 12/24   BREAST BIOPSY Left 09/27/2009   BREAST ENHANCEMENT SURGERY  2006   and removal   CARDIOVASCULAR STRESS TEST  2013    Dr. Monetta - WNL, EF 55-60%, with 0 calcium  score   CESAREAN SECTION     CHOLECYSTECTOMY  2007   biliary dyskinesia   COLONOSCOPY  08/2012   diverticulosis   COLONOSCOPY  2006   inflamed hyperplastic polyp   COSMETIC SURGERY  1985, 1989, 2010   ESOPHAGOGASTRODUODENOSCOPY  08/2012   esophageal reflux   ESOPHAGOGASTRODUODENOSCOPY  11/02/2008   Dr Charlanne Minimal gastritis.  Irregular Z line suggestive of gastroesophageal reflux (biopsied to rule out short-segment Barrett's esophagus) Incidental gastric polyps   HERNIA REPAIR  1996   NASAL SINUS SURGERY     RECTOCELE REPAIR  12/2008   Dr. Lewanda   sleep study  08/2007   OSA, 12 cm H2O,   TOTAL ABDOMINAL HYSTERECTOMY  1991   heavy bleeding, ovaries removed   TRIGGER FINGER RELEASE Right 06/18/2023   TUBAL LIGATION  1981   tummy tuck     US  ECHOCARDIOGRAPHY  09/2009   impaired relaxation, mild tric regurg, normal MV, EF 60%   US  ECHOCARDIOGRAPHY  11/2007   mild-mod MR    Family History  Problem Relation Age of Onset   Lung cancer Mother 42       passive smoke   Anxiety disorder Mother    Arthritis Mother    Cancer Mother 46   Depression Mother     Miscarriages / [place] Mother    Varicose Veins Mother    Lung cancer Father 41       smoker   Cancer Father 41   Colon polyps Sister    COPD Sister    Stroke Sister        TIA   Anxiety disorder Sister    Arthritis Sister    COPD Sister    Depression Sister    Hyperlipidemia Sister    Hypertension Sister    Hypertension Sister    Heart failure Sister    Aortic dissection Maternal Aunt    Breast cancer Maternal Aunt 60 - 69   Arthritis Maternal Aunt    Diabetes Maternal Aunt    Heart disease Maternal Aunt    Hyperlipidemia Maternal Aunt    Lung cancer Paternal Aunt    Breast cancer Paternal Aunt 56 - 72   Cancer Paternal Aunt    Aortic dissection Paternal Uncle    Heart attack Paternal Uncle    AAA (abdominal aortic aneurysm) Paternal Uncle    Hypertension Paternal Uncle    Heart disease Paternal Uncle    Stroke Paternal Uncle    Hyperlipidemia Paternal Uncle    Hypertension Paternal Uncle    Breast cancer Cousin 26 - 39   ADD / ADHD Son    Anxiety disorder Maternal Aunt    Arthritis Maternal Aunt    Varicose Veins Maternal Aunt    Anxiety disorder Paternal Aunt    Depression Paternal Aunt    Diabetes Paternal Aunt    Heart disease Paternal Aunt    Hyperlipidemia Paternal Aunt    Hypertension Paternal Aunt    Obesity Paternal Aunt    Diabetes Paternal Aunt    Heart disease Paternal Aunt    Heart disease Paternal Uncle    Hypertension Paternal Uncle    ADD / ADHD Son    Cancer Paternal Uncle    Diabetes Paternal Aunt    Hypertension Paternal Aunt    Diabetes Paternal Uncle    Heart disease Paternal Uncle    Hyperlipidemia Paternal Uncle    Varicose Veins Maternal Aunt    ADD / ADHD Son    Diabetes Maternal Aunt    Heart disease Maternal Aunt    Hypertension Maternal Aunt    Hypertension Daughter    Colon cancer Neg Hx    Rectal cancer Neg Hx    Stomach cancer Neg Hx    Esophageal cancer Neg Hx     SOCIAL HX: ***   Current Outpatient  Medications:  albuterol  (PROVENTIL  HFA) 108 (90 Base) MCG/ACT inhaler, Inhale 2 puffs into the lungs every 6 (six) hours as needed., Disp: 18 g, Rfl: 10   ALPRAZolam  (XANAX ) 0.25 MG tablet, TAKE 1 TABLET (0.25 MG TOTAL) BY MOUTH ONCE DAILY AS NEEDED FOR SLEEP., Disp: 30 tablet, Rfl: 5   atorvastatin  (LIPITOR) 40 MG tablet, Take 2 tablets (80 mg total) by mouth daily., Disp: 90 tablet, Rfl: 3   azelastine  (ASTELIN ) 0.1 % nasal spray, Place 2 sprays into both nostrils 2 (two) times daily. Use in each nostril as directed, Disp: 90 mL, Rfl: 3   Blood Glucose Calibration High LIQD, Used to check accuracy of blood glucose machine., Disp: 1 each, Rfl: 0   cetirizine  (ZYRTEC ) 10 MG tablet, Take 1 tablet (10 mg total) by mouth daily., Disp: 90 tablet, Rfl: 3   Cholecalciferol (VITAMIN D3) 50 MCG (2000 UT) TABS, Take 1 tablet by mouth daily., Disp: 90 tablet, Rfl: 3   Coenzyme Q10 (COQ10 PO), Take by mouth daily., Disp: , Rfl:    conjugated estrogens  (PREMARIN ) vaginal cream, Place vaginally 2 (two) times a week. INSERT 0.5 APPLICATORS FULL VAGINALLY TWICE PER WEEK. AT BEDTIME., Disp: 30 g, Rfl: 4   cyanocobalamin  (VITAMIN B12) 1000 MCG/ML injection, Inject 1 mL (1,000 mcg total) into the muscle every 30 (thirty) days., Disp: 3 mL, Rfl: 3   cyclobenzaprine  (FLEXERIL ) 5 MG tablet, Take 1 tablet (5 mg total) by mouth at bedtime as needed for muscle spasms., Disp: 10 tablet, Rfl: 0   cycloSPORINE  (RESTASIS ) 0.05 % ophthalmic emulsion, Place 1 drop into both eyes 2 (two) times daily., Disp: 3 each, Rfl: 3   desonide (DESOWEN) 0.05 % cream, Apply topically as needed., Disp: , Rfl:    diazepam  (VALIUM ) 5 MG tablet, TAKE 1 TABLET BY MOUTH EVERY 6 HOURS AS NEEDED FOR ANXIETY., Disp: 30 tablet, Rfl: 0   Docusate Calcium  (STOOL SOFTENER PO), Take 1 tablet by mouth as needed., Disp: , Rfl:    EPINEPHrine  0.3 mg/0.3 mL IJ SOAJ injection, 0.3 mg by injection route., Disp: 1 each, Rfl: 1   estradiol  (VIVELLE -DOT) 0.1  MG/24HR patch, Place 1 patch (0.1 mg total) onto the skin 2 (two) times a week., Disp: 24 patch, Rfl: 3   famotidine  (PEPCID ) 20 MG tablet, Take 1 tablet (20 mg total) by mouth daily as needed for heartburn or indigestion., Disp: 90 tablet, Rfl: 3   fluticasone  (FLONASE ) 50 MCG/ACT nasal spray, Place 1 spray into both nostrils daily., Disp: 9.9 mL, Rfl: 10   glucose blood (ONETOUCH VERIO) test strip, Used to check blood sugars 3 times daily., Disp: 300 each, Rfl: 3   hydrocortisone  (ANUSOL -HC) 2.5 % rectal cream, Place 1 Application rectally 2 (two) times daily. For 10 days and then as needed, Disp: 30 g, Rfl: 4   ipratropium (ATROVENT ) 0.03 % nasal spray, Place 2 sprays into both nostrils 3 (three) times daily as needed for rhinitis., Disp: 90 mL, Rfl: 3   levothyroxine  (SYNTHROID ) 50 MCG tablet, Take 1 tablet (50 mcg total) by mouth daily before breakfast., Disp: 90 tablet, Rfl: 3   losartan  (COZAAR ) 50 MG tablet, Take 1 tablet (50 mg total) by mouth daily., Disp: 90 tablet, Rfl: 3   Loteprednol Etabonate 0.5 % GEL, PLEASE SEE ATTACHED FOR DETAILED DIRECTIONS, Disp: , Rfl:    MAGNESIUM  PO, Take 250 mg by mouth daily., Disp: , Rfl:    meclizine  (ANTIVERT ) 25 MG tablet, Take 1 tablet (25 mg total) by mouth 3 (three)  times daily as needed (vertigo)., Disp: 84 tablet, Rfl: 3   metoprolol  succinate (TOPROL -XL) 50 MG 24 hr tablet, Take 1 tablet (50 mg total) by mouth daily., Disp: 90 tablet, Rfl: 3   montelukast  (SINGULAIR ) 10 MG tablet, Take 1 tablet (10 mg total) by mouth at bedtime., Disp: 90 tablet, Rfl: 3   Multiple Vitamin (MULTIVITAMIN) tablet, Take 1 tablet by mouth daily., Disp: , Rfl:    mupirocin  ointment (BACTROBAN ) 2 %, Apply topically as needed., Disp: , Rfl:    NONFORMULARY OR COMPOUNDED ITEM, Spironolactone  (PF) 0.005 mg opth. Both eyes 4 times daily, Disp: , Rfl:    omega-3 acid ethyl esters (LOVAZA ) 1 g capsule, Take 1 capsule (1 g total) by mouth 2 (two) times daily., Disp: 90 capsule,  Rfl: 3   ondansetron  (ZOFRAN  ODT) 4 MG disintegrating tablet, Take 1 tablet (4 mg total) by mouth every 8 (eight) hours as needed for nausea or vomiting., Disp: 30 tablet, Rfl: 2   OneTouch Delica Lancets 33G MISC, Used to check blood sugars twice daly., Disp: 300 each, Rfl: 3   Probiotic Product (PROBIOTIC DAILY PO), Take by mouth., Disp: , Rfl:    Propylene Glycol 0.6 % SOLN, Apply 1 drop to eye as needed., Disp: , Rfl:    Spacer/Aero-Holding Chambers (AEROCHAMBER MV) inhaler, Use as instructed, Disp: 1 each, Rfl: 0   Syringe/Needle, Disp, (SYRINGE 3CC/20GX1) 20G X 1 3 ML MISC, 1 Syringe by Does not apply route every 30 (thirty) days. For use with b-12 injection monthly, Disp: 50 each, Rfl: 0   Tiotropium Bromide Monohydrate  (SPIRIVA  RESPIMAT) 2.5 MCG/ACT AERS, Inhale 2 puffs into the lungs daily., Disp: 3 each, Rfl: 3   tretinoin  (RETIN-A ) 0.05 % cream, Apply topically at bedtime., Disp: 45 g, Rfl: 1   urea  (CARMOL) 40 % CREA, Apply topically., Disp: , Rfl:    Wheat Dextrin (BENEFIBER DRINK MIX PO), Take by mouth. Use as directed, Disp: , Rfl:    ezetimibe  (ZETIA ) 10 MG tablet, Take 1 tablet (10 mg total) by mouth daily. (Patient not taking: Reported on 07/11/2024), Disp: 90 tablet, Rfl: 3   Tiotropium Bromide Monohydrate  (SPIRIVA  RESPIMAT) 1.25 MCG/ACT AERS, Inhale 2 puffs into the lungs daily. (Patient not taking: Reported on 07/11/2024), Disp: , Rfl:   EXAM:  VITALS per patient if applicable:  GENERAL: alert, oriented, appears well and in no acute distress  HEENT: atraumatic, conjunttiva clear, no obvious abnormalities on inspection of external nose and ears  NECK: normal movements of the head and neck  LUNGS: on inspection no signs of respiratory distress, breathing rate appears normal, no obvious gross SOB, gasping or wheezing  CV: no obvious cyanosis  MS: moves all visible extremities without noticeable abnormality  PSYCH/NEURO: pleasant and cooperative, no obvious depression  or anxiety, speech and thought processing grossly intact  ASSESSMENT AND PLAN: Prediabetes Assessment & Plan: SHE HAS LOWERED HER A1C TO 5.9  WITH CAREFUL DIET        I discussed the assessment and treatment plan with the patient. The patient was provided an opportunity to ask questions and all were answered. The patient agreed with the plan and demonstrated an understanding of the instructions.   The patient was advised to call back or seek an in-person evaluation if the symptoms worsen or if the condition fails to improve as anticipated.   I spent 30 minutes dedicated to the care of this patient on the date of this encounter to include pre-visit review of patient's medical history,  including recent ER visit, imaging studies and labs, face-to-face time with the patient , and post visit ordering of testing and therapeutics.    Verneita LITTIE Kettering, MD

## 2024-07-11 NOTE — Patient Instructions (Addendum)
 Try taking qulipta every other day,  if it id still causing side effects  . send me message and I will send in the 10 mg dose to [facility]   I Agree with resuming prozac  and have refilled it at [facility]    I am  prescribing alprazolam  to help you sleep .  Do not take if you are sleepy when you lie down,  but ok to take if you wake up BEFORE 3 AM and can't go back to sleep

## 2024-07-11 NOTE — Assessment & Plan Note (Signed)
 SHE HAS LOWERED HER A1C TO 5.9  WITH CAREFUL DIET

## 2024-07-12 DIAGNOSIS — H8190 Unspecified disorder of vestibular function, unspecified ear: Secondary | ICD-10-CM | POA: Insufficient documentation

## 2024-07-12 NOTE — Assessment & Plan Note (Signed)
 Has stopped PPIs and using pepcid ,20 mg bid for breakthrough symptoms

## 2024-07-12 NOTE — Assessment & Plan Note (Addendum)
 She would like to resume Prozac  for management of her current anxiety which is expected to improve once her transition to [place]  has been achieved. Resuming at 10 mg daily

## 2024-07-12 NOTE — Assessment & Plan Note (Signed)
She continues to require alprazolam prn insomnia and to help tolerate CPAP .  Refilled today. The risks and benefits of benzodiazepine use were discussed with patient today including excessive sedation leading to respiratory depression,  impaired thinking/driving, and addiction.  Patient was advised to avoid concurrent use with alcohol, to use medication only as needed and not to share with others  .

## 2024-07-12 NOTE — Assessment & Plan Note (Addendum)
 Diagnosed with an A1c of 6.5  improved with diet. advised to continue  statin .  She has no proteinuria.   Lab Results  Component Value Date   HGBA1C 5.9 (H) 04/22/2024   Lab Results  Component Value Date   MICROALBUR <0.7 12/14/2023   MICROALBUR <0.2 05/30/2022

## 2024-07-20 ENCOUNTER — Other Ambulatory Visit: Payer: Self-pay | Admitting: Physician Assistant

## 2024-07-20 DIAGNOSIS — I10 Essential (primary) hypertension: Secondary | ICD-10-CM

## 2024-07-21 ENCOUNTER — Ambulatory Visit (INDEPENDENT_AMBULATORY_CARE_PROVIDER_SITE_OTHER): Admitting: Internal Medicine

## 2024-07-21 ENCOUNTER — Ambulatory Visit: Admitting: Internal Medicine

## 2024-07-21 ENCOUNTER — Encounter: Payer: Self-pay | Admitting: Internal Medicine

## 2024-07-21 VITALS — BP 120/70 | HR 64 | Temp 98.7°F | Ht 65.0 in | Wt 165.4 lb

## 2024-07-21 DIAGNOSIS — J019 Acute sinusitis, unspecified: Secondary | ICD-10-CM | POA: Diagnosis not present

## 2024-07-21 DIAGNOSIS — G4733 Obstructive sleep apnea (adult) (pediatric): Secondary | ICD-10-CM | POA: Diagnosis not present

## 2024-07-21 DIAGNOSIS — J4520 Mild intermittent asthma, uncomplicated: Secondary | ICD-10-CM | POA: Diagnosis not present

## 2024-07-21 LAB — NITRIC OXIDE: Nitric Oxide: 32

## 2024-07-21 MED ORDER — SPIRIVA RESPIMAT 2.5 MCG/ACT IN AERS: 2.0000 | INHALATION_SPRAY | Freq: Every day | RESPIRATORY_TRACT | 3 refills | Status: AC

## 2024-07-21 MED ORDER — ALBUTEROL SULFATE HFA 108 (90 BASE) MCG/ACT IN AERS: 2.0000 | INHALATION_SPRAY | Freq: Four times a day (QID) | RESPIRATORY_TRACT | 10 refills | Status: AC | PRN

## 2024-07-21 NOTE — Progress Notes (Signed)
 [facility]   Brief Summary:  73 year old female with asthma and OSA    Past Medical History:  Vit D deficiency, Vertigo, Rosacea, Hypothyroidism, HTN, HLD, GERD, Diverticulosis, Depression, HA, COVID 12 February 2021  Past Surgical History:  She  has a past surgical history that includes Cholecystectomy (2007); Appendectomy (2007); Tubal ligation (1981); Total abdominal hysterectomy (1991); Nasal sinus surgery; Breast enhancement surgery (2006); Cardiovascular stress test (2013); Colonoscopy (08/2012); Esophagogastroduodenoscopy (08/2012); US  ECHOCARDIOGRAPHY (09/2009); US  ECHOCARDIOGRAPHY (11/2007); sleep study (08/2007); Rectocele repair (12/2008); Colonoscopy (2006); Breast biopsy (Left, 09/27/2009); Hernia repair (1996); bladder tack; tummy tuck; Esophagogastroduodenoscopy (11/02/2008); Cesarean section; bladder tack (08/2022); Trigger finger release (Right, 06/18/2023); and Cosmetic surgery (1985, 1989, 2010).     CC Follow-up assessment for OSA Follow-up assessment for asthma   Subjective:   Re:OSA  Discussed sleep data and reviewed with patient.  Encouraged proper weight management.  Discussed driving precautions and its relationship with hypersomnolence.  Discussed sleep hygiene, and benefits of a fixed sleep waked time.  The importance of getting eight or more hours of sleep discussed with patient.  Discussed limiting the use of the computer and television before bedtime.  Decrease naps during the day, so night time sleep will become enhanced.  Limit caffeine, and sleep deprivation.   Patient uses and benefits from therapy Using CPAP nightly and with naps Pressure setting is comfortable and is sleeping well.   Patient uses and benefits from therapy Using CPAP nightly and with naps Pressure setting is comfortable and is sleeping well. Auto CPAP 5-10 Previous AHI 19 Less fatigue more energy AHI reduced to  0.6  [id number] Diagnosed in 2004 Multiple inhalers in the past Patient currently on Trelegy inhaler  Assessment of ASTHMA FeNO  60  ppb-Elevated exhaled Nitric oxide  testing is highly consistent with type II inflammation Lab Results  Component Value Date   NITRICOXIDE 60 06/16/2024    Assessment of ASTHMA FeNO    ppb-Elevated exhaled Nitric oxide  testing is highly consistent with type II inflammation   No exacerbation at this time No evidence of heart failure at this time No evidence or signs of infection at this time No respiratory distress No fevers, chills, nausea, vomiting, diarrhea No evidence of lower extremity edema No evidence hemoptysis  Multiple nasal sprays and antihistamines Triggers, cold air, perfumes, dust, carpet in bedroom Patient started on Trelegy last office visit    Physical Exam:   BP 120/70   Pulse 64   Temp 98.7 F (37.1 C)   Ht 5' 5 (1.651 m)   Wt 165 lb 6.4 oz (75 kg)   SpO2 98%   BMI 27.52 kg/m    Review of Systems: Gen:  Denies  fever, sweats, chills weight loss  HEENT: Denies blurred vision, double vision, ear pain, eye pain, hearing loss, nose bleeds, sore throat Cardiac:  No dizziness, chest pain or heaviness, chest tightness,edema, No JVD Resp:   No cough, -sputum production, -shortness of breath,-wheezing, -hemoptysis,  Other:  All other systems negative   Physical Examination:   General Appearance: No distress  EYES PERRLA, EOM intact.   NECK Supple, No JVD Pulmonary: normal breath sounds, No wheezing.  CardiovascularNormal S1,S2.  No m/r/g.   Abdomen: Benign, Soft, non-tender. Neurology UE/LE 5/5 strength, no focal deficits Ext pulses intact, cap refill intact ALL OTHER ROS ARE NEGATIVE        Pulmonary testing:  Spirometry 12/12 >> FEV1% 68 RAST 11/15/14 >> IgE 30, cats/mold PFT 02/07/16 >>  FEV1 2.32 (92%), FEV1% 76, FEF 25-75% 1.56 (71%), TLC 4.25 (81%), DLCO 121, borderline BD from FEF 25-75 FeNO 12/01/18 >>  7 IgE 12/16/21 >> 23 FeNO 62 01/2024 FeNO August 2025>>60   Chest Imaging:  Coronary CT 04/04/21 >> stable 2 mm nodule LLL, coronary calcium  score 0 CT sinus 01/09/22 >> normal CT angio chest 07/18/22 >> mild dependent atelectasis  Sleep Tests:  PSG 09/06/07 >> AHI 19 CPAP 05/17/23 to 06/15/23 >> used on 30 of 30 nights with average 7 hrs 15 min.  Average AHI 0.7 with median CPAP 6 and 95 th percentile CPAP 8 cm H2O CPAP DL 88/7975 AutoCPAP 4-89       AHI 1, 100% compliance days >4hrs  Cardiac Tests:  Echo 01/23/21 >> EF 60 to 65%, grade 2 DD, RVSP 39.1 mmHg, mild MR  Social History:  She  reports that she quit smoking about 46 years ago. Her smoking use included cigarettes. She started smoking about 53 years ago. She has a 2.1 pack-year smoking history. She has been exposed to tobacco smoke. She has never used smokeless tobacco. She reports that she does not drink alcohol and does not use drugs.  Family History:  Her family history includes AAA (abdominal aortic aneurysm) in her paternal uncle; ADD / ADHD in her son, son, and son; Anxiety disorder in her maternal aunt, mother, paternal aunt, and sister; Aortic dissection in her maternal aunt and paternal uncle; Arthritis in her maternal aunt, maternal aunt, mother, and sister; Breast cancer (age of onset: 35 - 19) in her cousin; Breast cancer (age of onset: 39 - 31) in her maternal aunt and paternal aunt; COPD in her sister and sister; Cancer in her paternal aunt and paternal uncle; Cancer (age of onset: 16) in her father; Cancer (age of onset: 55) in her mother; Colon polyps in her sister; Depression in her mother, paternal aunt, and sister; Diabetes in her maternal aunt, maternal aunt, paternal aunt, paternal aunt, paternal aunt, and paternal uncle; Heart attack in her paternal uncle; Heart disease in her maternal aunt, maternal aunt, paternal aunt, paternal aunt, paternal uncle, paternal uncle, and paternal uncle; Heart failure in her sister;  Hyperlipidemia in her maternal aunt, paternal aunt, paternal uncle, paternal uncle, and sister; Hypertension in her daughter, maternal aunt, paternal aunt, paternal aunt, paternal uncle, paternal uncle, paternal uncle, sister, and sister; Lung cancer in her paternal aunt; Lung cancer (age of onset: 76) in her father; Lung cancer (age of onset: 58) in her mother; Miscarriages / Stillbirths in her mother; Obesity in her paternal aunt; Stroke in her paternal uncle and sister; Varicose Veins in her maternal aunt, maternal aunt, and mother.     Assessment/Plan:  9 year old pleasant white female seen today for follow-up assessment for underlying sleep apnea underlying asthma with chronic allergic rhinitis    Assessment of OSA Previous AHI 19 Continue CPAP as prescribed  Excellent compliance report Reviewed compliance report in detail with patient Patient definitely benefits the use of CPAP therapy as prescribed Using CPAP nightly and with naps Pressure setting is comfortable and is sleeping well. CPAP prescription 5-10 AHI reduced to 0.6  No evidence of acute heart failure at this time No respiratory distress No fevers, chills, nausea, vomiting, diarrhea No evidence hemoptysis  Patient Instructions Continue to use CPAP every night, minimum of 4-6 hours a night.  Change equipment every 30 days or as directed by DME.  Wash your tubing with warm soap and water daily, hang to dry. Wash  humidifier portion weekly. Use bottled, distilled water and change daily   Be aware of reduced alertness and do not drive or operate heavy machinery if experiencing this or drowsiness.  Exercise encouraged, as tolerated. Encouraged proper weight management.  Important to get eight or more hours of sleep  Limiting the use of the computer and television before bedtime.  Decrease naps during the day, so night time sleep will become enhanced.  Limit caffeine, and sleep deprivation.  HTN, stroke, uncontrolled  diabetes and heart failure are potential risk factors.  Risk of untreated sleep apnea including cardiac arrhthymias, stroke, DM, pulm HTN.    Chronic allergic rhinitis FlonFlonase 2 sprays each nostril once pre day Atrovent  nasal spray/Astelin  spray needed Continue Zyrtec  10 mg dily   Allergic asthma-asthma exacerbation at this time FENO check 60 Plan to continue with Spiriva  Respimat however will increase dosage to 2.5 Will NOT start biological agents at this time Patient is waiting for RHEUMOTOGICAL DX Symbicort  2.5 has significantly Of symptoms  Avoid Allergens and Irritants Avoid secondhand smoke Avoid SICK contacts Recommend  Masking  when appropriate Recommend Keep up-to-date with vaccinations     Medication List:   Allergies as of 07/21/2024       Reactions   Codeine Other (See Comments)   Low blood pressure/nausea   Olodaterol Palpitations   Qvar  [beclomethasone] Other (See Comments)   Thrush per patient even with rinsing.    Sulfonamide Derivatives Rash   rash        Medication List        Accurate as of July 21, 2024 10:22 AM. If you have any questions, ask your nurse or doctor.          STOP taking these medications    estradiol  0.1 MG/24HR patch Commonly known as: VIVELLE -DOT       TAKE these medications    AeroChamber MV inhaler Use as instructed   albuterol  108 (90 Base) MCG/ACT inhaler Commonly known as: Proventil  HFA Inhale 2 puffs into the lungs every 6 (six) hours as needed.   ALPRAZolam  0.25 MG tablet Commonly known as: XANAX  TAKE 1 TABLET (0.25 MG TOTAL) BY MOUTH ONCE DAILY AS NEEDED FOR SLEEP.   ALPRAZolam  0.25 MG tablet Commonly known as: XANAX  Take 1 tablet (0.25 mg total) by mouth at bedtime as needed for anxiety.   Atogepant 30 MG Tabs Take 30 mg by mouth.   atorvastatin  40 MG tablet Commonly known as: LIPITOR Take 2 tablets (80 mg total) by mouth daily.   azelastine  0.1 % nasal spray Commonly known as:  ASTELIN  Place 2 sprays into both nostrils 2 (two) times daily. Use in each nostril as directed   BENEFIBER DRINK MIX PO Take by mouth. Use as directed   Blood Glucose Calibration High Liqd Used to check accuracy of blood glucose machine.   cetirizine  10 MG tablet Commonly known as: ZYRTEC  Take 1 tablet (10 mg total) by mouth daily.   COQ10 PO Take by mouth daily.   cyanocobalamin  1000 MCG/ML injection Commonly known as: VITAMIN B12 Inject 1 mL (1,000 mcg total) into the muscle every 30 (thirty) days.   cyclobenzaprine  5 MG tablet Commonly known as: FLEXERIL  Take 1 tablet (5 mg total) by mouth at bedtime as needed for muscle spasms.   cycloSPORINE  0.05 % ophthalmic emulsion Commonly known as: RESTASIS  Place 1 drop into both eyes 2 (two) times daily.   desonide 0.05 % cream Commonly known as: DESOWEN Apply topically as needed.   diazepam   5 MG tablet Commonly known as: VALIUM  TAKE 1 TABLET BY MOUTH EVERY 6 HOURS AS NEEDED FOR ANXIETY.   EPINEPHrine  0.3 mg/0.3 mL Soaj injection Commonly known as: EPI-PEN 0.3 mg by injection route.   estradiol  0.1 MG/GM vaginal cream Commonly known as: ESTRACE  Place 1 Applicatorful vaginally 3 (three) times a week.   famotidine  20 MG tablet Commonly known as: PEPCID  Take 1 tablet (20 mg total) by mouth daily as needed for heartburn or indigestion.   FLUoxetine  10 MG capsule Commonly known as: PROZAC  Take 1 capsule (10 mg total) by mouth daily.   Fluoxetine  HCl (PMDD) 10 MG Tabs Take 10 mg by mouth.   fluticasone  50 MCG/ACT nasal spray Commonly known as: FLONASE  Place 1 spray into both nostrils daily.   hydrocortisone  2.5 % rectal cream Commonly known as: ANUSOL -HC Place 1 Application rectally 2 (two) times daily. For 10 days and then as needed   ipratropium 0.03 % nasal spray Commonly known as: ATROVENT  Place 2 sprays into both nostrils 3 (three) times daily as needed for rhinitis.   levothyroxine  50 MCG tablet Commonly  known as: Synthroid  Take 1 tablet (50 mcg total) by mouth daily before breakfast.   losartan  50 MG tablet Commonly known as: COZAAR  TAKE 1 TABLET BY MOUTH EVERY DAY   Loteprednol Etabonate 0.5 % Gel PLEASE SEE ATTACHED FOR DETAILED DIRECTIONS   MAGNESIUM  PO Take 250 mg by mouth daily.   meclizine  25 MG tablet Commonly known as: ANTIVERT  Take 1 tablet (25 mg total) by mouth 3 (three) times daily as needed (vertigo).   metoprolol  succinate 50 MG 24 hr tablet Commonly known as: TOPROL -XL Take 1 tablet (50 mg total) by mouth daily.   montelukast  10 MG tablet Commonly known as: SINGULAIR  Take 1 tablet (10 mg total) by mouth at bedtime.   multivitamin tablet Take 1 tablet by mouth daily.   mupirocin  ointment 2 % Commonly known as: BACTROBAN  Apply topically as needed.   NONFORMULARY OR COMPOUNDED ITEM Spironolactone  (PF) 0.005 mg opth. Both eyes 4 times daily   omega-3 acid ethyl esters 1 g capsule Commonly known as: Lovaza  Take 1 capsule (1 g total) by mouth 2 (two) times daily.   ondansetron  4 MG disintegrating tablet Commonly known as: Zofran  ODT Take 1 tablet (4 mg total) by mouth every 8 (eight) hours as needed for nausea or vomiting.   OneTouch Delica Lancets 33G Misc Used to check blood sugars twice daly.   OneTouch Verio test strip Generic drug: glucose blood Used to check blood sugars 3 times daily.   Premarin  vaginal cream Generic drug: conjugated estrogens  Place vaginally 2 (two) times a week. INSERT 0.5 APPLICATORS FULL VAGINALLY TWICE PER WEEK. AT BEDTIME.   PROBIOTIC DAILY PO Take by mouth.   Propylene Glycol 0.6 % Soln Apply 1 drop to eye as needed.   Spiriva  Respimat 2.5 MCG/ACT Aers Generic drug: Tiotropium Bromide Monohydrate  Inhale 2 puffs into the lungs daily.   STOOL SOFTENER PO Take 1 tablet by mouth as needed.   SYRINGE 3CC/20GX1 20G X 1 3 ML Misc 1 Syringe by Does not apply route every 30 (thirty) days. For use with b-12 injection  monthly   tretinoin  0.05 % cream Commonly known as: RETIN-A  Apply topically at bedtime.   urea  40 % Crea Commonly known as: CARMOL Apply topically.   Vitamin D3 50 MCG (2000 UT) Tabs Take 1 tablet by mouth daily.        MEDICATION ADJUSTMENTS/LABS AND TESTS ORDERED: Continue Symbicort   2.5 Please rinse mouth and gargle after every use Flonase  2 sprays each nostril once pre day Atrovent  nasal spray/Astelin  spray needed Continue Zyrtec  Continue CPAP as prescribed Travel/CPAP prescription provided   CURRENT MEDICATIONS REVIEWED AT LENGTH WITH PATIENT TODAY   Patient  satisfied with Plan of action and management. All questions answered  I spent a total of 45 minutes dedicated to the care of this patient on the date of this encounter to include pre-visit review of records, face-to-face time with the patient discussing conditions above, post visit ordering of testing, clinical documentation with the electronic health record, making appropriate referrals as documented, and communicating necessary information to the patient's healthcare team.    The Patient requires high complexity decision making for assessment and support, frequent evaluation and titration of therapies, application of advanced monitoring technologies and extensive interpretation of multiple databases.  Patient satisfied with Plan of action and management. All questions answered    Nickolas Alm Cellar, M.D.  [facility]  Medical Director [facility] Medical Director [facility] Cardio-Pulmonary Department

## 2024-07-21 NOTE — Patient Instructions (Addendum)
 Excellent Job A+ GOLD STAR!!  Continue CPAP as prescribed  Patient Instructions Continue to use CPAP every night, minimum of 4-6 hours a night.  Change equipment every 30 days or as directed by DME.  Wash your tubing with warm soap and water daily, hang to dry. Wash humidifier portion weekly. Use bottled, distilled water and change daily   Be aware of reduced alertness and do not drive or operate heavy machinery if experiencing this or drowsiness.  Exercise encouraged, as tolerated. Encouraged proper weight management.  Important to get eight or more hours of sleep  Limiting the use of the computer and television before bedtime.  Decrease naps during the day, so night time sleep will become enhanced.  Limit caffeine, and sleep deprivation.    Avoid Allergens and Irritants Avoid secondhand smoke Avoid SICK contacts Recommend  Masking  when appropriate Recommend Keep up-to-date with vaccinations  Prescription for travel/CPAP to be provided Recommend calling insurance company to assess how old your machine is  Continue Spiriva  at this time plan to use as needed when you moved to [place]
